# Patient Record
Sex: Female | Born: 1950
Health system: Southern US, Community
[De-identification: ages and names within clinical notes are randomized; demographics above are authoritative.]

## PROBLEM LIST (undated history)

## (undated) DIAGNOSIS — Z87828 Personal history of other (healed) physical injury and trauma: Secondary | ICD-10-CM

## (undated) DIAGNOSIS — Z8719 Personal history of other diseases of the digestive system: Secondary | ICD-10-CM

## (undated) DIAGNOSIS — A630 Anogenital (venereal) warts: Secondary | ICD-10-CM

## (undated) DIAGNOSIS — B1910 Unspecified viral hepatitis B without hepatic coma: Secondary | ICD-10-CM

## (undated) DIAGNOSIS — E876 Hypokalemia: Secondary | ICD-10-CM

## (undated) DIAGNOSIS — I10 Essential (primary) hypertension: Secondary | ICD-10-CM

## (undated) DIAGNOSIS — Z8619 Personal history of other infectious and parasitic diseases: Secondary | ICD-10-CM

## (undated) DIAGNOSIS — M1990 Unspecified osteoarthritis, unspecified site: Secondary | ICD-10-CM

## (undated) DIAGNOSIS — Z9289 Personal history of other medical treatment: Secondary | ICD-10-CM

## (undated) DIAGNOSIS — K635 Polyp of colon: Secondary | ICD-10-CM

## (undated) DIAGNOSIS — R7303 Prediabetes: Secondary | ICD-10-CM

## (undated) DIAGNOSIS — K219 Gastro-esophageal reflux disease without esophagitis: Secondary | ICD-10-CM

## (undated) DIAGNOSIS — K5792 Diverticulitis of intestine, part unspecified, without perforation or abscess without bleeding: Secondary | ICD-10-CM

## (undated) DIAGNOSIS — R011 Cardiac murmur, unspecified: Secondary | ICD-10-CM

## (undated) DIAGNOSIS — Z8669 Personal history of other diseases of the nervous system and sense organs: Secondary | ICD-10-CM

## (undated) HISTORY — DX: Unspecified osteoarthritis, unspecified site: M19.90

## (undated) HISTORY — DX: Personal history of other (healed) physical injury and trauma: Z87.828

## (undated) HISTORY — DX: Personal history of other infectious and parasitic diseases: Z86.19

## (undated) HISTORY — DX: Personal history of other diseases of the nervous system and sense organs: Z86.69

## (undated) HISTORY — DX: Unspecified viral hepatitis B without hepatic coma: B19.10

## (undated) HISTORY — DX: Diverticulitis of intestine, part unspecified, without perforation or abscess without bleeding: K57.92

## (undated) HISTORY — DX: Anogenital (venereal) warts: A63.0

## (undated) HISTORY — PX: VESICOVAGINAL FISTULA CLOSURE W/ TAH: SUR271

## (undated) HISTORY — DX: Personal history of other medical treatment: Z92.89

## (undated) HISTORY — DX: Essential (primary) hypertension: I10

## (undated) HISTORY — DX: Gastro-esophageal reflux disease without esophagitis: K21.9

## (undated) HISTORY — DX: Polyp of colon: K63.5

## (undated) HISTORY — DX: Cardiac murmur, unspecified: R01.1

## (undated) HISTORY — DX: Hypokalemia: E87.6

---
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
## 1978-05-14 HISTORY — PX: APPENDECTOMY: SHX54

## 1978-05-14 HISTORY — PX: ECTOPIC PREGNANCY SURGERY: SHX613

## 2004-05-14 HISTORY — PX: ABDOMINAL HYSTERECTOMY: SHX81

## 2004-05-14 HISTORY — PX: HERNIA REPAIR: SHX51

## 2004-05-14 HISTORY — PX: TOTAL ABDOMINAL HYSTERECTOMY W/ BILATERAL SALPINGOOPHORECTOMY: SHX83

## 2004-05-14 HISTORY — PX: KNEE ARTHROSCOPY: SHX127

## 2007-06-30 ENCOUNTER — Ambulatory Visit: Payer: Self-pay | Admitting: Internal Medicine

## 2008-02-05 ENCOUNTER — Ambulatory Visit: Payer: Self-pay | Admitting: Neurology

## 2008-05-14 DIAGNOSIS — K635 Polyp of colon: Secondary | ICD-10-CM

## 2008-05-14 HISTORY — DX: Polyp of colon: K63.5

## 2008-05-14 LAB — HM PAP SMEAR

## 2008-05-14 LAB — HM COLONOSCOPY

## 2008-05-29 ENCOUNTER — Ambulatory Visit: Payer: Self-pay

## 2009-11-11 ENCOUNTER — Encounter: Payer: Self-pay | Admitting: Cardiovascular Disease

## 2009-11-15 ENCOUNTER — Ambulatory Visit: Payer: Self-pay | Admitting: Internal Medicine

## 2009-11-18 ENCOUNTER — Ambulatory Visit: Payer: Self-pay | Admitting: Cardiovascular Disease

## 2009-11-18 DIAGNOSIS — R079 Chest pain, unspecified: Secondary | ICD-10-CM | POA: Insufficient documentation

## 2009-11-19 DIAGNOSIS — I10 Essential (primary) hypertension: Secondary | ICD-10-CM | POA: Insufficient documentation

## 2009-11-21 ENCOUNTER — Encounter: Payer: Self-pay | Admitting: Cardiovascular Disease

## 2010-02-11 LAB — HM MAMMOGRAPHY

## 2010-02-24 ENCOUNTER — Ambulatory Visit: Payer: Self-pay | Admitting: Unknown Physician Specialty

## 2010-02-27 LAB — PATHOLOGY REPORT

## 2010-06-13 NOTE — Assessment & Plan Note (Signed)
Summary: NEW PT   Visit Type:  Initial Consult Primary Provider:  Carmine Savoy.  CC:  c/o chest pain with shortness of breath and occas. left arm pain.Terri Werner  History of Present Illness: 60 year old woman with a no known cardiac history with obesity,chest pain, chronic knee and back pain, who is enrolled in a hypertension study at [facility] who has been under a tremendous family stressors presents for evaluation of chest pain.  She reports having chest discomfort over the past several years. It happens in the evenings though some nausea in the daytime, coming on with sensations of discomfort between her breasts. It does not last very long, does not seem to be associated with exertion. She does not get to exercise very much as she is very busy. 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 Her mom has lived with her since 2007, and has caused disruptive sleep as she has sleepwalked frequently in the evenings.  She reports that her blood pressure has been elevated recently though she is due to followup at [facility] as part of their blood pressure study and they typically adjust the medications.  She has had a stress test 2 years ago which was reportedly negative. She also had an echocardiogram at that time. Her symptoms are not typically worse since then, seem to come and go. She wonders if it may be due to stress.  EKG shows normal sinus rhythm with rate 61 beats per minute, poor R-wave progression through the precordial leads, left axis deviation, and no other significant ST or T wave changes  Preventive Screening-Counseling & Management  Alcohol-Tobacco     Smoking Status: never  Caffeine-Diet-Exercise     Does Patient Exercise: no      Drug Use:  no.    Current Medications (verified): 1)  Vitamin D3 2000 Unit Caps (Cholecalciferol) .Terri Werner.. 1 Once  Daily 2)  Amlodipine Besylate 2.5 Mg Tabs (Amlodipine Besylate) .Terri Werner.. 1 Once Daily 3)  Meloxicam 15 Mg Tabs (Meloxicam) .Terri Werner.. 1 Once Daily 4)  Ultram 50 Mg Tabs (Tramadol Hcl) .Terri Werner.. 1 Two Times A Day As Needed  Allergies (verified): 1)  ! * Latex 2)  ! * Shellfish  Past History:  Family History: Last updated: 11/18/2009 Family History of Cancer: sister breast cancer Family History of Hyperlipidemia:  Family History of Hypertension: mother alzheimers:mother  Social History: Last updated: 11/18/2009 Full Time Widowed  Tobacco Use - No.  Alcohol Use - yes Regular Exercise - no Drug Use - no  Risk Factors: Exercise: no (11/18/2009)  Risk Factors: Smoking Status: never (11/18/2009)  Past Medical History: Heart murmur Hypertension  Past Surgical History: tonsillectomy hysterectomy C-section laproscopic left knee repair  Family History: Family History of Cancer: sister breast cancer Family History of Hyperlipidemia:  Family History of Hypertension: mother alzheimers:mother  Social History: Full Time Widowed  Tobacco Use - No.  Alcohol Use - yes Regular Exercise - no Drug Use - no Smoking Status:  never Does Patient Exercise:  no Drug Use:  no  Review of Systems       The patient complains of chest pain.  The patient denies fever, weight loss, weight gain, vision loss, decreased hearing, hoarseness, syncope, dyspnea on exertion, peripheral edema, prolonged cough, abdominal pain, incontinence, muscle weakness, depression, and enlarged lymph nodes.    Vital Signs:  Patient profile:  60 year old female Height:      63 inches Weight:      240 pounds BMI:     42.67 Pulse rate:   61 / minute BP sitting:   158 / 88  (left arm)  Vitals Entered By: Bishop Dublin, CMA (November 18, 2009 4:17 PM)  Physical Exam  General:  Well developed, well nourished, in no acute distress.Obese Head:  normocephalic and atraumatic Neck:  Neck supple, no JVD. No masses,  thyromegaly or abnormal cervical nodes. Lungs:  Clear bilaterally to auscultation and percussion. Heart:  Non-displaced PMI, chest non-tender; regular rate and rhythm, S1, S2 without murmurs, rubs or gallops. Carotid upstroke normal, no bruit.  Pedals normal pulses. Trace LE  edema, no varicosities. Abdomen:  Bowel sounds positive; abdomen soft and non-tender without masses, obese Msk:  Back normal, normal gait. Muscle strength and tone normal. Pulses:  pulses normal in all 4 extremities Extremities:  No clubbing or cyanosis. Neurologic:  Alert and oriented x 3. Skin:  Intact without lesions or rashes. Psych:  Normal affect.   Impression & Recommendations:  Problem # 1:  CHEST PAIN UNSPECIFIED (ICD-786.50) etiology of her chest pain is likely noncardiac. She does not have a significant family history of premature coronary artery disease. Her symptoms are somewhat atypical as they come on at rest. It has been going on for several years with negative stress test 2 years ago. We'll try to obtain the stress test for our records.  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 If she has symptoms of worsening shortness of breath or chest discomfort while exerting herself, have asked her to contact me. We would schedule a stress test. If she feels well with exertion, I feel that we could continue to monitor her. I've asked her to watch her diet, increase her exercise in an effort to lose some weight.  Her updated medication list for this problem includes:    Amlodipine Besylate 2.5 Mg Tabs (Amlodipine besylate) .Terri Werner... 1 once daily  Orders: EKG w/ Interpretation (93000)  Problem # 2:  HYPERTENSION, BENIGN (ICD-401.1) Her blood pressure is borderline elevated today and I have suggested she contact the group at [facility] who have her enrolled in the hypertension study to discuss whether they may need to increase her amlodipine dose.  Her updated medication list for  this problem includes:    Amlodipine Besylate 2.5 Mg Tabs (Amlodipine besylate) .Terri Werner... 1 once daily  Patient Instructions: 1)  Your physician recommends that you schedule a follow-up appointment in: as needed  2)  Your physician recommends that you continue on your current medications as directed. Please refer to the Current Medication list given to you today.

## 2010-06-13 NOTE — Letter (Signed)
[facility]   Imported By: Park Breed 11/21/2009 10:32:47  _____________________________________________________________________  External Attachment:    Type:   Image     Comment:   External Document

## 2010-11-28 ENCOUNTER — Encounter: Payer: Self-pay | Admitting: Cardiovascular Disease

## 2011-05-30 ENCOUNTER — Other Ambulatory Visit: Payer: Self-pay | Admitting: Internal Medicine

## 2011-06-22 ENCOUNTER — Encounter: Payer: Self-pay | Admitting: Internal Medicine

## 2011-06-22 ENCOUNTER — Other Ambulatory Visit (HOSPITAL_COMMUNITY)
Admission: RE | Admit: 2011-06-22 | Discharge: 2011-06-22 | Disposition: A | Discharge status: Home / Self Care | Payer: PRIVATE HEALTH INSURANCE | Source: Ambulatory Visit | Attending: Internal Medicine | Admitting: Internal Medicine

## 2011-06-22 ENCOUNTER — Ambulatory Visit (INDEPENDENT_AMBULATORY_CARE_PROVIDER_SITE_OTHER): Payer: PRIVATE HEALTH INSURANCE | Admitting: Internal Medicine

## 2011-06-22 DIAGNOSIS — Z01419 Encounter for gynecological examination (general) (routine) without abnormal findings: Secondary | ICD-10-CM | POA: Insufficient documentation

## 2011-06-22 DIAGNOSIS — Z1159 Encounter for screening for other viral diseases: Secondary | ICD-10-CM | POA: Insufficient documentation

## 2011-06-22 DIAGNOSIS — Z1239 Encounter for other screening for malignant neoplasm of breast: Secondary | ICD-10-CM

## 2011-06-22 DIAGNOSIS — Z0000 Encounter for general adult medical examination without abnormal findings: Secondary | ICD-10-CM | POA: Insufficient documentation

## 2011-06-22 DIAGNOSIS — I10 Essential (primary) hypertension: Secondary | ICD-10-CM

## 2011-06-22 DIAGNOSIS — M2550 Pain in unspecified joint: Secondary | ICD-10-CM

## 2011-06-22 LAB — LIPID PANEL
Cholesterol: 200 mg/dL (ref 0–200)
HDL: 50.4 mg/dL (ref >39.00)
LDL Cholesterol: 135 mg/dL — ABNORMAL HIGH (ref 0–99)
Total CHOL/HDL Ratio: 4
Triglycerides: 72 mg/dL (ref 0.0–149.0)
VLDL: 14.4 mg/dL (ref 0.0–40.0)

## 2011-06-22 LAB — COMPREHENSIVE METABOLIC PANEL
ALT: 15 U/L (ref 0–35)
AST: 18 U/L (ref 0–37)
Albumin: 4.2 g/dL (ref 3.5–5.2)
Alkaline Phosphatase: 56 U/L (ref 39–117)
BUN: 14 mg/dL (ref 6–23)
CO2: 29 mEq/L (ref 19–32)
Calcium: 9.8 mg/dL (ref 8.4–10.5)
Chloride: 103 mEq/L (ref 96–112)
Creatinine, Ser: 0.9 mg/dL (ref 0.4–1.2)
GFR: 78.28 mL/min (ref >60.00)
Glucose, Bld: 94 mg/dL (ref 70–99)
Potassium: 3.8 mEq/L (ref 3.5–5.1)
Sodium: 139 mEq/L (ref 135–145)
Total Bilirubin: 0.4 mg/dL (ref 0.3–1.2)
Total Protein: 8 g/dL (ref 6.0–8.3)

## 2011-06-22 LAB — MICROALBUMIN / CREATININE URINE RATIO
Creatinine,U: 66.9 mg/dL
Microalb Creat Ratio: 1.3 mg/g (ref 0.0–30.0)
Microalb, Ur: 0.9 mg/dL (ref 0.0–1.9)

## 2011-06-22 MED ORDER — TRAMADOL HCL 50 MG PO TABS: 50.0000 mg | ORAL_TABLET | Freq: Two times a day (BID) | ORAL | Status: DC | PRN

## 2011-06-22 NOTE — Assessment & Plan Note (Signed)
Exam normal today. Pap is pending. Mammogram was ordered. Patient is up-to-date on vaccinations. Will obtain records from her previous office. Followup in 6 months.

## 2011-06-22 NOTE — Assessment & Plan Note (Signed)
Breast exam normal today. Mammogram ordered. 

## 2011-06-22 NOTE — Assessment & Plan Note (Signed)
Blood pressure slightly elevated today. However, patient reports good control at home. We'll continue amlodipine. We'll check renal function with labs today. Followup in 6 months.

## 2011-06-22 NOTE — Assessment & Plan Note (Signed)
Chronic secondary to osteoarthritis. Symptoms improved with tramadol. We'll plan to continue. Followup in 6 months.

## 2011-06-22 NOTE — Progress Notes (Signed)
Subjective:    Patient ID: Terri Werner, female    DOB: 06/06/1950, 61 y.o.   MRN: [id number]  HPI 61 year old female with history of hypertension and arthralgias presents for her general medical exam. She reports that she is generally doing well. 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 She is also caring for her elderly mother. She reports some fatigue secondary to this. Overall, however, she is doing well. She reports that her arthralgia is well controlled with use of tramadol. She also reports that her blood pressures well-controlled using amlodipine.  A couple of weeks ago she had an episode in which he had a skin lesion over her right breast which she describes as red and painful and ultimately draining purulent fluid. This has subsequently resolved without any intervention. She also notes that last month, she was seen at urgent care for upper respiratory infection and treated for Augmentin. She reports that her symptoms of cough and congestion have completely resolved.  Outpatient Encounter Prescriptions as of 06/22/2011  Medication Sig Dispense Refill  . amLODipine (NORVASC) 2.5 MG tablet Take 2.5 mg by mouth daily.        . Cholecalciferol (VITAMIN D3) 2000 UNITS capsule Take 3,000 Units by mouth daily.       . meloxicam (MOBIC) 15 MG tablet take 1 tablet by mouth once daily  90 tablet  4  . naproxen sodium (ANAPROX) 220 MG tablet Take 220 mg by mouth 2 (two) times daily as needed.      . traMADol (ULTRAM) 50 MG tablet Take 1 tablet (50 mg total) by mouth 2 (two) times daily as needed.  30 tablet  2  . DISCONTD: traMADol (ULTRAM) 50 MG tablet Take 50 mg by mouth 2 (two) times daily as needed.          Review of Systems  Constitutional: Negative for fever, chills, appetite change, fatigue and unexpected weight change.  HENT: Negative for ear pain, congestion, sore throat, trouble swallowing, neck pain, voice change and sinus  pressure.   Eyes: Negative for visual disturbance.  Respiratory: Negative for cough, shortness of breath, wheezing and stridor.   Cardiovascular: Negative for chest pain, palpitations and leg swelling.  Gastrointestinal: Negative for nausea, vomiting, abdominal pain, diarrhea, constipation, blood in stool, abdominal distention and anal bleeding.  Genitourinary: Negative for dysuria and flank pain.  Musculoskeletal: Positive for arthralgias. Negative for myalgias and gait problem.  Skin: Negative for color change and rash.  Neurological: Negative for dizziness and headaches.  Hematological: Negative for adenopathy. Does not bruise/bleed easily.  Psychiatric/Behavioral: Negative for suicidal ideas, sleep disturbance and dysphoric mood. The patient is not nervous/anxious.    Pulse 81  Temp(Src) 98.5 F (36.9 C) (Oral)  Wt 244 lb (110.678 kg)  SpO2 98%     Objective:   Physical Exam  Constitutional: She is oriented to person, place, and time. She appears well-developed and well-nourished. No distress.  HENT:  Head: Normocephalic and atraumatic.  Right Ear: External ear normal.  Left Ear: External ear normal.  Nose: Nose normal.  Mouth/Throat: Oropharynx is clear and moist. No oropharyngeal exudate.  Eyes: Conjunctivae are normal. Pupils are equal, round, and reactive to light. Right eye exhibits no discharge. Left eye exhibits no discharge. No scleral icterus.  Neck: Normal range of motion. Neck supple. No tracheal deviation present. No thyromegaly present.  Cardiovascular: Normal rate, regular rhythm, normal heart sounds and intact distal pulses.  Exam reveals no  gallop and no friction rub.   No murmur heard. Pulmonary/Chest: Effort normal and breath sounds normal. No respiratory distress. She has no wheezes. She has no rales. She exhibits no tenderness.  Abdominal: Soft. Bowel sounds are normal. She exhibits no distension and no mass. There is no tenderness. There is no rebound and no  guarding.  Genitourinary: No breast swelling, tenderness, discharge or bleeding. Pelvic exam was performed with patient prone. There is no rash, tenderness or lesion on the right labia. There is no rash, tenderness or lesion on the left labia. Cervix exhibits no motion tenderness, no discharge and no friability. Right adnexum displays no mass, no tenderness and no fullness. Left adnexum displays no mass, no tenderness and no fullness. There is tenderness (very tender, attempted exam with both medium and small speculum, but difficult to visualize cervix because of pt discomfort) around the vagina. No erythema around the vagina. No vaginal discharge found.  Musculoskeletal: Normal range of motion. She exhibits no edema and no tenderness.  Lymphadenopathy:    She has no cervical adenopathy.  Neurological: She is alert and oriented to person, place, and time. No cranial nerve deficit. She exhibits normal muscle tone. Coordination normal.  Skin: Skin is warm and dry. No rash noted. She is not diaphoretic. No erythema. No pallor.  Psychiatric: She has a normal mood and affect. Her behavior is normal. Judgment and thought content normal.          Assessment & Plan:

## 2011-07-13 ENCOUNTER — Encounter: Payer: Self-pay | Admitting: Internal Medicine

## 2011-08-02 ENCOUNTER — Encounter: Payer: Self-pay | Admitting: Internal Medicine

## 2011-08-14 ENCOUNTER — Encounter: Payer: Self-pay | Admitting: Internal Medicine

## 2012-06-28 ENCOUNTER — Other Ambulatory Visit: Payer: Self-pay

## 2012-07-02 ENCOUNTER — Telehealth: Payer: Self-pay | Admitting: Internal Medicine

## 2012-07-02 ENCOUNTER — Encounter: Payer: Self-pay | Admitting: Internal Medicine

## 2012-07-02 NOTE — Telephone Encounter (Signed)
Left message for pt to call office

## 2012-07-02 NOTE — Telephone Encounter (Signed)
Left message for pt to call office.  See my chart message below.      Appointment Request From: Terri Werner      With Provider: Shelia Media, MD [-Primary Care Physician-]      Preferred Date Range: From 07/02/2012 To 07/11/2012        Preferred Times: Monday Afternoon, Tuesday Afternoon, Wednesday Afternoon, Thursday Afternoon, Friday Afternoon      Reason: To address the following health maintenance concerns.   Tetanus/Tdap   Zostavax      Comments:   I would also like to have an assessment of neck and chest as I am having pain when coughing, bending and walking up and down steps with some headaches as well. I had flu shot provided at work in the fall 2013.

## 2012-07-03 ENCOUNTER — Emergency Department: Payer: Self-pay | Admitting: Emergency Medicine

## 2012-07-03 ENCOUNTER — Encounter: Payer: Self-pay | Admitting: Adult Health

## 2012-07-03 ENCOUNTER — Ambulatory Visit (INDEPENDENT_AMBULATORY_CARE_PROVIDER_SITE_OTHER): Payer: PRIVATE HEALTH INSURANCE | Admitting: Adult Health

## 2012-07-03 VITALS — BP 176/93 | HR 68 | Temp 98.7°F | Resp 14 | Ht 63.0 in | Wt 249.0 lb

## 2012-07-03 DIAGNOSIS — R0789 Other chest pain: Secondary | ICD-10-CM

## 2012-07-03 DIAGNOSIS — R079 Chest pain, unspecified: Secondary | ICD-10-CM

## 2012-07-03 LAB — COMPREHENSIVE METABOLIC PANEL
Albumin: 3.8 g/dL (ref 3.4–5.0)
Alkaline Phosphatase: 67 U/L (ref 50–136)
Anion Gap: 5 — ABNORMAL LOW (ref 7–16)
BUN: 10 mg/dL (ref 7–18)
Bilirubin,Total: 0.5 mg/dL (ref 0.2–1.0)
Calcium, Total: 9 mg/dL (ref 8.5–10.1)
Chloride: 109 mmol/L — ABNORMAL HIGH (ref 98–107)
Co2: 27 mmol/L (ref 21–32)
Creatinine: 0.81 mg/dL (ref 0.60–1.30)
EGFR (African American): >60
EGFR (Non-African Amer.): >60
Glucose: 77 mg/dL (ref 65–99)
Osmolality: 279 (ref 275–301)
Potassium: 3.7 mmol/L (ref 3.5–5.1)
SGOT(AST): 22 U/L (ref 15–37)
SGPT (ALT): 21 U/L (ref 12–78)
Sodium: 141 mmol/L (ref 136–145)
Total Protein: 8.3 g/dL — ABNORMAL HIGH (ref 6.4–8.2)

## 2012-07-03 LAB — CBC
HCT: 40.6 % (ref 35.0–47.0)
HGB: 13.1 g/dL (ref 12.0–16.0)
MCH: 27.7 pg (ref 26.0–34.0)
MCHC: 32.2 g/dL (ref 32.0–36.0)
MCV: 86 fL (ref 80–100)
Platelet: 206 10*3/uL (ref 150–440)
RBC: 4.72 10*6/uL (ref 3.80–5.20)
RDW: 15.3 % — ABNORMAL HIGH (ref 11.5–14.5)
WBC: 7.8 10*3/uL (ref 3.6–11.0)

## 2012-07-03 LAB — TROPONIN I
Troponin-I: <0.02 ng/mL
Troponin-I: <0.02 ng/mL

## 2012-07-03 NOTE — Assessment & Plan Note (Signed)
Unable to perform an EKG 2/2 computer system being down. Given patients atypical presentation I do not feel comfortable sending her home. Discussed this with patient and I am sending her for further w/u at [facility] emergency dept. I called the ED at [phone] and spoke with the Triage Nurse, Misty Stanley. She will be looking out for her.

## 2012-07-03 NOTE — Progress Notes (Signed)
Subjective:    Patient ID: Terri Werner, female    DOB: [birth year], 62 y.o.   MRN: [id number]  HPI  Patient is a pleasant 62 y/o female who presents to clinic with a 2 day hx of chest pain, neck pain, pain on inspiration.  Patient has been shoveling snow and wondered if she caused some muscle strain. She is short of breath on exertion but she reports that this is not a new finding.   Current Outpatient Prescriptions on File Prior to Visit  Medication Sig Dispense Refill  . amLODipine (NORVASC) 2.5 MG tablet Take 0.25 mg by mouth daily.       . Cholecalciferol (VITAMIN D3) 2000 UNITS capsule Take 3,000 Units by mouth daily.       . meloxicam (MOBIC) 15 MG tablet take 1 tablet by mouth once daily  90 tablet  4  . naproxen sodium (ANAPROX) 220 MG tablet Take 220 mg by mouth 2 (two) times daily as needed.      . traMADol (ULTRAM) 50 MG tablet Take 1 tablet (50 mg total) by mouth 2 (two) times daily as needed.  30 tablet  2   No current facility-administered medications on file prior to visit.      Review of Systems  Respiratory: Positive for chest tightness and shortness of breath.   Cardiovascular: Positive for chest pain.       Patient reports feeling her heart flutter. This lasts approximately 30 seconds.  Gastrointestinal: Negative for nausea.  Neurological: Positive for light-headedness. Negative for syncope.       No syncopal episodes but feeling like she is going to pass out.    BP 176/93  Pulse 68  Temp(Src) 98.7 F (37.1 C) (Oral)  Resp 14  Ht 5\' 3"  (1.6 m)  Wt 249 lb (112.946 kg)  BMI 44.12 kg/m2  SpO2 100%     Objective:   Physical Exam  Constitutional: She is oriented to person, place, and time.  Patient appears short of breath and increases with activity. She is not her usual self.  Cardiovascular: Normal rate, regular rhythm, normal heart sounds and intact distal pulses.   Pulmonary/Chest: Effort normal and breath sounds normal.  Neurological: She  is alert and oriented to person, place, and time.          Assessment & Plan:

## 2012-07-03 NOTE — Telephone Encounter (Signed)
Appointment 07/03/12 with Terri Werner  Pt aware of appointment

## 2012-07-03 NOTE — Patient Instructions (Signed)
  Please go to the ED at [facility] for w/u of chest pain.

## 2012-10-13 ENCOUNTER — Encounter: Payer: Self-pay | Admitting: Internal Medicine

## 2012-10-14 ENCOUNTER — Telehealth: Payer: Self-pay | Admitting: Internal Medicine

## 2012-10-14 NOTE — Telephone Encounter (Signed)
See below my chart message  Appointment Request From: Maryann Conners With Provider: Wynona Dove, MD [-Primary Care Physician-]  Preferred Date Range: From 10/20/2012 To 11/10/2012  Preferred Times: Wednesday Morning, Friday Morning, Wednesday Afternoon, Friday Afternoon  Reason: To address the following health maintenance concerns. Tetanus/Tdap Zostavax Comments:

## 2012-10-16 ENCOUNTER — Encounter: Payer: Self-pay | Admitting: *Deleted

## 2012-10-16 NOTE — Telephone Encounter (Signed)
Sent patient a Mychart message.

## 2012-10-24 ENCOUNTER — Telehealth: Payer: Self-pay | Admitting: Internal Medicine

## 2012-10-24 NOTE — Telephone Encounter (Signed)
Returning your call. °

## 2012-10-24 NOTE — Telephone Encounter (Signed)
Left message stating labs were normal and letter had been mailed to home address on file.

## 2013-02-11 ENCOUNTER — Encounter: Payer: Self-pay | Admitting: Internal Medicine

## 2013-02-16 ENCOUNTER — Other Ambulatory Visit: Payer: Self-pay | Admitting: Internal Medicine

## 2013-02-16 NOTE — Telephone Encounter (Signed)
Eprescribed.

## 2013-03-19 ENCOUNTER — Other Ambulatory Visit: Payer: Self-pay

## 2013-05-28 ENCOUNTER — Telehealth: Payer: Self-pay | Admitting: Internal Medicine

## 2013-05-28 NOTE — Telephone Encounter (Signed)
Patient Information:  Caller Name: Terri Werner  Phone: [phone]  Patient: Terri Werner  Gender: Female  DOB: 20-May-1950  Age: 63 Years  PCP: Ronette Deter (Adults only)  Office Follow Up:  Does the office need to follow up with this patient?: No  Instructions For The Office: N/A   Symptoms  Reason For Call & Symptoms: Pt states she has Flu-like symptoms,  cough, congestion, runny noses with discharge clear to yellow.  Reviewed Health History In EMR: Yes  Reviewed Medications In EMR: Yes  Reviewed Allergies In EMR: Yes  Reviewed Surgeries / Procedures: Yes  Date of Onset of Symptoms: 05/11/2013  Guideline(s) Used:  Influenza - Seasonal  Disposition Per Guideline:   Go to ED Now  Reason For Disposition Reached:   Chest pain (EXCEPTION: Mild central chest pain, present only when coughing)  Advice Given:  Call Back If:  You become short of breath or worse.  Patient Will Follow Care Advice:  YES

## 2013-05-28 NOTE — Telephone Encounter (Signed)
FYI to Dr. Walker 

## 2013-06-02 ENCOUNTER — Other Ambulatory Visit: Payer: Self-pay | Admitting: Internal Medicine

## 2013-07-15 ENCOUNTER — Telehealth: Payer: Self-pay | Admitting: Emergency Medicine

## 2013-07-15 NOTE — Telephone Encounter (Signed)
We can schedule her for a 28min physical.

## 2013-07-15 NOTE — Telephone Encounter (Signed)
Pt calling wanting to schedule a CPE, she states she is your patient but you have only seen her once in clinic and that was 06/22/2011. What is your guidelines for a patient that has not been seen in clinic in two year by you. She has however seen Raquel for a acute visit last February. Please advise.

## 2013-07-20 NOTE — Telephone Encounter (Signed)
lvm for pt to call our office in reference to apt scheduled for her

## 2013-07-21 NOTE — Telephone Encounter (Signed)
Pt left vm.  Asking for call on her cell [phone].  Asks for you to please not call her home phone number.

## 2013-08-31 LAB — HM PAP SMEAR: HM Pap smear: NORMAL

## 2013-09-02 ENCOUNTER — Encounter: Payer: Self-pay | Admitting: Podiatry

## 2013-09-02 ENCOUNTER — Ambulatory Visit (INDEPENDENT_AMBULATORY_CARE_PROVIDER_SITE_OTHER): Payer: No Typology Code available for payment source

## 2013-09-02 ENCOUNTER — Ambulatory Visit (INDEPENDENT_AMBULATORY_CARE_PROVIDER_SITE_OTHER): Payer: No Typology Code available for payment source | Admitting: Podiatry

## 2013-09-02 VITALS — Resp 16 | Ht 64.0 in | Wt 243.0 lb

## 2013-09-02 DIAGNOSIS — M79609 Pain in unspecified limb: Secondary | ICD-10-CM

## 2013-09-02 DIAGNOSIS — M79673 Pain in unspecified foot: Secondary | ICD-10-CM

## 2013-09-02 DIAGNOSIS — M722 Plantar fascial fibromatosis: Secondary | ICD-10-CM

## 2013-09-02 DIAGNOSIS — M76829 Posterior tibial tendinitis, unspecified leg: Secondary | ICD-10-CM

## 2013-09-02 DIAGNOSIS — M76821 Posterior tibial tendinitis, right leg: Secondary | ICD-10-CM

## 2013-09-02 DIAGNOSIS — Q6650 Congenital pes planus, unspecified foot: Secondary | ICD-10-CM

## 2013-09-02 MED ORDER — MELOXICAM 15 MG PO TABS: 15.0000 mg | ORAL_TABLET | Freq: Every day | ORAL | Status: DC

## 2013-09-02 MED ORDER — METHYLPREDNISOLONE (PAK) 4 MG PO TABS: ORAL_TABLET | ORAL | Status: DC

## 2013-09-02 NOTE — Progress Notes (Signed)
Subjective:    Patient ID: Terri Werner, female    DOB: [birth year], 63 y.o.   MRN: [id number]  HPI Comments: i stepped down wrong from a step and hurt my foot, knee and back.i did not fall. My right ankle and heel hurts. The pain has gotten worse. i went to see a chiropractor and it has helped. i have been putting rubs on it, ice, and exercises.  Foot Pain Associated symptoms include fatigue and numbness.      Review of Systems  Constitutional: Positive for activity change and fatigue.  Cardiovascular:       Calf pain with walking  Musculoskeletal:       Joint pain Back pain Difficulty walking  Allergic/Immunologic: Positive for food allergies.  Neurological: Positive for numbness.  All other systems reviewed and are negative.      Objective:   Physical Exam: I have reviewed her past medical history medications allergies surgeries social history of systems. Pulses are strongly palpable bilateral. Neurologic sensorium is intact per since once the monofilament. Deep tendon reflexes are brisk and equal bilateral. Muscle strength is 5 over 5 dorsiflexors plantar flexors inverters everters all intrinsic musculature is intact. Orthopedic evaluation demonstrates pain on palpation of the plantar medial calcaneal tubercle at the plantar fascial calcaneal insertion site. She also has pain on palpation of the posterior tibial tendon of the right foot. There does appear to be some fluctuance within the tendon sheath as it courses beneath the medial malleolus. All pes planus is also noted.        Assessment & Plan:  Assessment: Plantar fasciitis and capsulitis with posterior tibial tendinitis right foot and ankle. We cannot rule out a tear at this point.  Plan: Injected the right heel today put her in a Cam Walker wrote her prescription for [facility] to be followed after a Medrol Dosepak. She will continue to elevate this as much as possible and utilize ice therapy. I will followup  with her in one month

## 2013-09-03 ENCOUNTER — Other Ambulatory Visit: Payer: Self-pay

## 2013-09-03 DIAGNOSIS — Z1231 Encounter for screening mammogram for malignant neoplasm of breast: Secondary | ICD-10-CM

## 2013-09-07 ENCOUNTER — Encounter: Payer: Self-pay | Admitting: Internal Medicine

## 2013-09-07 ENCOUNTER — Ambulatory Visit (INDEPENDENT_AMBULATORY_CARE_PROVIDER_SITE_OTHER): Payer: PRIVATE HEALTH INSURANCE | Admitting: Internal Medicine

## 2013-09-07 VITALS — BP 154/92 | HR 62 | Temp 98.2°F | Resp 12 | Ht 62.0 in | Wt 244.5 lb

## 2013-09-07 DIAGNOSIS — R0609 Other forms of dyspnea: Secondary | ICD-10-CM

## 2013-09-07 DIAGNOSIS — R0600 Dyspnea, unspecified: Secondary | ICD-10-CM

## 2013-09-07 DIAGNOSIS — Z0000 Encounter for general adult medical examination without abnormal findings: Secondary | ICD-10-CM | POA: Insufficient documentation

## 2013-09-07 DIAGNOSIS — R5381 Other malaise: Secondary | ICD-10-CM

## 2013-09-07 DIAGNOSIS — R0989 Other specified symptoms and signs involving the circulatory and respiratory systems: Secondary | ICD-10-CM

## 2013-09-07 DIAGNOSIS — R5383 Other fatigue: Secondary | ICD-10-CM

## 2013-09-07 DIAGNOSIS — R079 Chest pain, unspecified: Secondary | ICD-10-CM

## 2013-09-07 LAB — COMPREHENSIVE METABOLIC PANEL
ALT: 17 U/L (ref 0–35)
AST: 17 U/L (ref 0–37)
Albumin: 4.2 g/dL (ref 3.5–5.2)
Alkaline Phosphatase: 52 U/L (ref 39–117)
BUN: 17 mg/dL (ref 6–23)
CO2: 27 mEq/L (ref 19–32)
Calcium: 9.5 mg/dL (ref 8.4–10.5)
Chloride: 103 mEq/L (ref 96–112)
Creatinine, Ser: 0.7 mg/dL (ref 0.4–1.2)
GFR: 107.41 mL/min (ref >60.00)
Glucose, Bld: 94 mg/dL (ref 70–99)
Potassium: 4.1 mEq/L (ref 3.5–5.1)
Sodium: 140 mEq/L (ref 135–145)
Total Bilirubin: 0.4 mg/dL (ref 0.3–1.2)
Total Protein: 8.2 g/dL (ref 6.0–8.3)

## 2013-09-07 LAB — LIPID PANEL
Cholesterol: 221 mg/dL — ABNORMAL HIGH (ref 0–200)
HDL: 59.2 mg/dL (ref >39.00)
LDL Cholesterol: 150 mg/dL — ABNORMAL HIGH (ref 0–99)
Total CHOL/HDL Ratio: 4
Triglycerides: 60 mg/dL (ref 0.0–149.0)
VLDL: 12 mg/dL (ref 0.0–40.0)

## 2013-09-07 LAB — CBC WITH DIFFERENTIAL/PLATELET
Basophils Absolute: 0 10*3/uL (ref 0.0–0.1)
Basophils Relative: 0.2 % (ref 0.0–3.0)
Eosinophils Absolute: 0 10*3/uL (ref 0.0–0.7)
Eosinophils Relative: 0.2 % (ref 0.0–5.0)
HCT: 40.4 % (ref 36.0–46.0)
Hemoglobin: 13.1 g/dL (ref 12.0–15.0)
Lymphocytes Relative: 17.6 % (ref 12.0–46.0)
Lymphs Abs: 2.1 10*3/uL (ref 0.7–4.0)
MCHC: 32.5 g/dL (ref 30.0–36.0)
MCV: 87.3 fl (ref 78.0–100.0)
Monocytes Absolute: 0.4 10*3/uL (ref 0.1–1.0)
Monocytes Relative: 3.7 % (ref 3.0–12.0)
Neutro Abs: 9.2 10*3/uL — ABNORMAL HIGH (ref 1.4–7.7)
Neutrophils Relative %: 78.3 % — ABNORMAL HIGH (ref 43.0–77.0)
Platelets: 235 10*3/uL (ref 150.0–400.0)
RBC: 4.63 Mil/uL (ref 3.87–5.11)
RDW: 15.7 % — ABNORMAL HIGH (ref 11.5–14.6)
WBC: 11.8 10*3/uL — ABNORMAL HIGH (ref 4.5–10.5)

## 2013-09-07 LAB — MICROALBUMIN / CREATININE URINE RATIO
Creatinine,U: 24.9 mg/dL
Microalb Creat Ratio: 2.8 mg/g (ref 0.0–30.0)
Microalb, Ur: 0.7 mg/dL (ref 0.0–1.9)

## 2013-09-07 LAB — BRAIN NATRIURETIC PEPTIDE: Pro B Natriuretic peptide (BNP): 32 pg/mL (ref 0.0–100.0)

## 2013-09-07 LAB — HEMOGLOBIN A1C: Hgb A1c MFr Bld: 6.2 % (ref 4.6–6.5)

## 2013-09-07 LAB — TSH: TSH: 0.57 u[IU]/mL (ref 0.35–5.50)

## 2013-09-07 LAB — HM MAMMOGRAPHY

## 2013-09-07 NOTE — Assessment & Plan Note (Signed)
Orthopnea noted at night. Will set up cardiology evaluation for possible repeat ECHO.

## 2013-09-07 NOTE — Progress Notes (Signed)
Pre visit review using our clinic review tool, if applicable. No additional management support is needed unless otherwise documented below in the visit note. 

## 2013-09-07 NOTE — Assessment & Plan Note (Signed)
Generalized fatigue. Exam normal.Will check CBC, CMP, TSH with labs. If normal, then consider referral for sleep study.
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
## 2013-09-07 NOTE — Assessment & Plan Note (Signed)
Unclear etiology substernal chest pain. Risk factors for CAD include obesity and HTN. EKG shows possible LA enlargement. Will set up cardiology evaluation. Question if ECHO and treadmill stress test might be helpful for further evaluation.

## 2013-09-08 LAB — VITAMIN D 25 HYDROXY (VIT D DEFICIENCY, FRACTURES): Vit D, 25-Hydroxy: 53 ng/mL (ref 30–89)

## 2013-09-16 ENCOUNTER — Ambulatory Visit
Admission: RE | Admit: 2013-09-16 | Discharge: 2013-09-16 | Disposition: A | Discharge status: Home / Self Care | Payer: No Typology Code available for payment source | Source: Ambulatory Visit

## 2013-09-16 DIAGNOSIS — Z1231 Encounter for screening mammogram for malignant neoplasm of breast: Secondary | ICD-10-CM

## 2013-09-18 ENCOUNTER — Encounter: Payer: Self-pay | Admitting: Cardiovascular Disease

## 2013-09-18 ENCOUNTER — Ambulatory Visit (INDEPENDENT_AMBULATORY_CARE_PROVIDER_SITE_OTHER): Payer: No Typology Code available for payment source | Admitting: Cardiovascular Disease

## 2013-09-18 VITALS — BP 144/96 | HR 60 | Ht 62.5 in | Wt 243.5 lb

## 2013-09-18 DIAGNOSIS — R000 Tachycardia, unspecified: Secondary | ICD-10-CM

## 2013-09-18 DIAGNOSIS — I10 Essential (primary) hypertension: Secondary | ICD-10-CM

## 2013-09-18 DIAGNOSIS — R42 Dizziness and giddiness: Secondary | ICD-10-CM

## 2013-09-18 DIAGNOSIS — R209 Unspecified disturbances of skin sensation: Secondary | ICD-10-CM

## 2013-09-18 DIAGNOSIS — R079 Chest pain, unspecified: Secondary | ICD-10-CM

## 2013-09-18 DIAGNOSIS — R200 Anesthesia of skin: Secondary | ICD-10-CM | POA: Insufficient documentation

## 2013-09-18 MED ORDER — PRAVASTATIN SODIUM 20 MG PO TABS: 20.0000 mg | ORAL_TABLET | Freq: Every evening | ORAL | Status: DC

## 2013-09-18 NOTE — Assessment & Plan Note (Signed)
Blood pressures running high. We have recommended she increase her amlodipine to 5 mg daily

## 2013-09-18 NOTE — Patient Instructions (Signed)
You are doing well.  We will schedule a treadmill stress test on a Wednesday for chest pains  Please start pravastatin one a day for cholesterol   Please call us if you have new issues that need to be addressed before your next appt.

## 2013-09-18 NOTE — Assessment & Plan Note (Addendum)
Long history of atypical chest pain. We had a long discussion about her recent symptoms and possible etiology. Symptoms presenting still at rest, not with exertion such as when she is at the gym. She has very few risk factors, she is a nonsmoker, no diabetes. Long discussion about various options for ischemia workup. We have ordered a routine treadmill study on her. She would like to avoid expensive nuclear stress test.

## 2013-09-18 NOTE — Progress Notes (Signed)
Patient ID: Terri Werner, female    DOB: [birth year], 63 y.o.   MRN: [id number]  HPI Comments: 63 year old woman with a no known cardiac history with obesity,chest pain, chronic knee and back pain, who is enrolled in a hypertension study at [facility], history of chronic chest pain. Seen previously in our clinic and felt to have atypical chest pain. She presents for symptoms of chest pain, arm and leg numbness. Allergy to ACE inh  she was seen in the emergency room February 2014 for chest pain, workup was negative   Chest pain symptoms date back many years. In general she reports having chest pain at home at the end of the day when she sits on the couch and does charts from work. Also sometimes when in bed. She raises her bed up, does not a comfortable and sleeps better in the recliner. Periodically she goes to the gym and reports having no significant chest pain with exercise. Discomfort is in her mediastinal area, presents like a squeezing. Usually resolves without intervention. Typically does not last very long. Possible resolution with change in position.  At home, blood pressure has been running high typically systolics in the 140s, diastolic in the 90s. She is followed by group affiliated with [facility] and was weaned off her amlodipine. Blood pressure was running high. Recently restarted amlodipine 2.5 mg daily.   Mother passed away at the end of 29-Apr-2013. Prior to this, she was taking care of her. Stress test several years ago, unable to obtain these results. Also had prior echocardiogram at that time    EKG shows normal sinus rhythm with rate 60 beats per minute,  left axis deviation, and no other significant ST or T wave changes     Outpatient Encounter Prescriptions as of 09/18/2013  Medication Sig  . amLODipine (NORVASC) 2.5 MG tablet Take 0.25 mg by mouth daily.   Marland Kitchen aspirin 81 MG tablet Take 81 mg by mouth daily.  . meloxicam (MOBIC) 15 MG tablet Take 15 mg by mouth daily as  needed.  . nystatin cream (MYCOSTATIN) Apply 1 application topically daily.   . [DISCONTINUED] meloxicam (MOBIC) 15 MG tablet Take 1 tablet (15 mg total) by mouth daily.  . pravastatin (PRAVACHOL) 20 MG tablet Take 1 tablet (20 mg total) by mouth every evening.  . [DISCONTINUED] methylPREDNIsolone (MEDROL DOSPACK) 4 MG tablet follow package directions     Review of Systems  Constitutional: Negative.   HENT: Negative.   Eyes: Negative.   Respiratory: Positive for chest tightness.   Cardiovascular: Negative.   Gastrointestinal: Negative.   Endocrine: Negative.   Musculoskeletal: Negative.        Arm and leg numbness at times arm  Skin: Negative.   Allergic/Immunologic: Negative.   Neurological: Negative.   Hematological: Negative.   Psychiatric/Behavioral: Negative.   All other systems reviewed and are negative.   BP 144/96  Pulse 60  Ht 5' 2.5" (1.588 m)  Wt 243 lb 8 oz (110.451 kg)  BMI 43.80 kg/m2  Physical Exam  Nursing note and vitals reviewed. Constitutional: She is oriented to person, place, and time. She appears well-developed and well-nourished.  Obese  HENT:  Head: Normocephalic.  Nose: Nose normal.  Mouth/Throat: Oropharynx is clear and moist.  Eyes: Conjunctivae are normal. Pupils are equal, round, and reactive to light.  Neck: Normal range of motion. Neck supple. No JVD present.  Cardiovascular: Normal rate, regular rhythm, S1 normal, S2 normal, normal heart sounds and intact distal pulses.  Exam  reveals no gallop and no friction rub.   No murmur heard. Pulmonary/Chest: Effort normal and breath sounds normal. No respiratory distress. She has no wheezes. She has no rales. She exhibits no tenderness.  Abdominal: Soft. Bowel sounds are normal. She exhibits no distension. There is no tenderness.  Musculoskeletal: Normal range of motion. She exhibits no edema and no tenderness.  Lymphadenopathy:    She has no cervical adenopathy.  Neurological: She is alert and  oriented to person, place, and time. Coordination normal.  Skin: Skin is warm and dry. No rash noted. No erythema.  Psychiatric: She has a normal mood and affect. Her behavior is normal. Judgment and thought content normal.    Assessment and Plan

## 2013-09-18 NOTE — Assessment & Plan Note (Signed)
Arm and leg tingling, numbness likely unrelated to any cardiac issue. Possibly from nerve entrapment, arthritis in her neck.

## 2013-09-21 ENCOUNTER — Other Ambulatory Visit (INDEPENDENT_AMBULATORY_CARE_PROVIDER_SITE_OTHER): Payer: No Typology Code available for payment source

## 2013-09-21 ENCOUNTER — Telehealth: Payer: Self-pay | Admitting: *Deleted

## 2013-09-21 DIAGNOSIS — D72819 Decreased white blood cell count, unspecified: Secondary | ICD-10-CM

## 2013-09-21 DIAGNOSIS — D72829 Elevated white blood cell count, unspecified: Secondary | ICD-10-CM

## 2013-09-21 LAB — CBC WITH DIFFERENTIAL/PLATELET
Basophils Absolute: 0 10*3/uL (ref 0.0–0.1)
Basophils Relative: 0.1 % (ref 0.0–3.0)
Eosinophils Absolute: 0.2 10*3/uL (ref 0.0–0.7)
Eosinophils Relative: 2.7 % (ref 0.0–5.0)
HCT: 37.2 % (ref 36.0–46.0)
Hemoglobin: 12.3 g/dL (ref 12.0–15.0)
Lymphocytes Relative: 34.8 % (ref 12.0–46.0)
Lymphs Abs: 2.4 10*3/uL (ref 0.7–4.0)
MCHC: 33.1 g/dL (ref 30.0–36.0)
MCV: 86.2 fl (ref 78.0–100.0)
Monocytes Absolute: 0.6 10*3/uL (ref 0.1–1.0)
Monocytes Relative: 9.5 % (ref 3.0–12.0)
Neutro Abs: 3.6 10*3/uL (ref 1.4–7.7)
Neutrophils Relative %: 52.9 % (ref 43.0–77.0)
Platelets: 176 10*3/uL (ref 150.0–400.0)
RBC: 4.32 Mil/uL (ref 3.87–5.11)
RDW: 15.5 % (ref 11.5–15.5)
WBC: 6.8 10*3/uL (ref 4.0–10.5)

## 2013-09-21 NOTE — Telephone Encounter (Signed)
CBC for leukocytosis

## 2013-09-21 NOTE — Telephone Encounter (Signed)
What labs and dx?  

## 2013-09-23 ENCOUNTER — Other Ambulatory Visit: Payer: Self-pay | Admitting: Obstetrics and Gynecology

## 2013-09-23 DIAGNOSIS — R928 Other abnormal and inconclusive findings on diagnostic imaging of breast: Secondary | ICD-10-CM

## 2013-09-28 ENCOUNTER — Encounter: Payer: Self-pay | Admitting: Podiatry

## 2013-09-28 ENCOUNTER — Ambulatory Visit (INDEPENDENT_AMBULATORY_CARE_PROVIDER_SITE_OTHER): Payer: No Typology Code available for payment source | Admitting: Podiatry

## 2013-09-28 VITALS — BP 121/75 | HR 77 | Resp 16

## 2013-09-28 DIAGNOSIS — M76821 Posterior tibial tendinitis, right leg: Secondary | ICD-10-CM

## 2013-09-28 DIAGNOSIS — M76829 Posterior tibial tendinitis, unspecified leg: Secondary | ICD-10-CM

## 2013-09-28 DIAGNOSIS — M722 Plantar fascial fibromatosis: Secondary | ICD-10-CM

## 2013-09-28 NOTE — Progress Notes (Signed)
She presents today states that she's approximately 50% improved. She has pain on palpation medial continued tubercle at its much better.  Objective: Pulses are strongly palpable right. She has mild tenderness on palpation medial continued tubercle of the right heel.  Assessment: Plantar fasciitis right.  Plan: Reinjected right heel today. Discussed appropriate shoe g will followup with her one month.ear stretching exercises ice therapy shoe gear modifications. Suggested she continue all conservative therapies

## 2013-09-30 ENCOUNTER — Encounter: Payer: Self-pay | Admitting: Cardiovascular Disease

## 2013-09-30 ENCOUNTER — Ambulatory Visit (INDEPENDENT_AMBULATORY_CARE_PROVIDER_SITE_OTHER): Payer: No Typology Code available for payment source | Admitting: Cardiovascular Disease

## 2013-09-30 VITALS — Ht 62.5 in | Wt 243.0 lb

## 2013-09-30 DIAGNOSIS — R079 Chest pain, unspecified: Secondary | ICD-10-CM

## 2013-09-30 DIAGNOSIS — R42 Dizziness and giddiness: Secondary | ICD-10-CM

## 2013-09-30 DIAGNOSIS — R000 Tachycardia, unspecified: Secondary | ICD-10-CM

## 2013-09-30 MED ORDER — AMLODIPINE BESYLATE 10 MG PO TABS: 10.0000 mg | ORAL_TABLET | Freq: Every day | ORAL | Status: DC

## 2013-09-30 NOTE — Patient Instructions (Addendum)
Please increase amlodipine to 10mg  daily Start low grade exercise program   Please call us if you have new issues that need to be addressed before your next appt.  Your physician wants you to follow-up in: 6 months.  You will receive a reminder letter in the mail two months in advance. If you don't receive a letter, please call our office to schedule the follow-up appointment.

## 2013-09-30 NOTE — Procedures (Signed)
Exercise Treadmill Test  Treadmill ordered for recent epsiodes of chest pain.  Resting EKG shows NSR with rate of 80 bpm, no significant ST or T wave changes.  Resting blood pressure of 176/94 Stand bruce protocal was used.  Patient exercised for 4 min 28 sec,  Peak heart rate of 139 bpm.  This was 87% of the maximum predicted heart rate. Achieved 6.3  METS No symptoms of chest pain or lightheadedness were reported at peak stress or in recovery.  Peak Blood pressure recorded was 253/77 Heart rate at 3 minutes in recovery was 93 bpm. No ST abnormality concerning for ischemia  FINAL IMPRESSION: No significant EKG changes concerning for ischemia. Adequate exercise tolerance. Severe hypertension with exertion We have recommended she increase her amlodipine up to 10 mg daily

## 2013-10-07 ENCOUNTER — Ambulatory Visit
Admission: RE | Admit: 2013-10-07 | Discharge: 2013-10-07 | Disposition: A | Discharge status: Home / Self Care | Payer: No Typology Code available for payment source | Source: Ambulatory Visit | Attending: Obstetrics and Gynecology | Admitting: Obstetrics and Gynecology

## 2013-10-07 DIAGNOSIS — R928 Other abnormal and inconclusive findings on diagnostic imaging of breast: Secondary | ICD-10-CM

## 2013-10-07 LAB — HM MAMMOGRAPHY: HM Mammogram: NORMAL

## 2013-10-08 ENCOUNTER — Encounter: Payer: Self-pay | Admitting: Internal Medicine

## 2013-10-08 ENCOUNTER — Ambulatory Visit (INDEPENDENT_AMBULATORY_CARE_PROVIDER_SITE_OTHER): Payer: No Typology Code available for payment source | Admitting: Internal Medicine

## 2013-10-08 VITALS — BP 126/80 | HR 78 | Temp 98.6°F | Ht 62.0 in | Wt 240.8 lb

## 2013-10-08 DIAGNOSIS — E785 Hyperlipidemia, unspecified: Secondary | ICD-10-CM

## 2013-10-08 DIAGNOSIS — I10 Essential (primary) hypertension: Secondary | ICD-10-CM

## 2013-10-08 DIAGNOSIS — R079 Chest pain, unspecified: Secondary | ICD-10-CM

## 2013-10-08 NOTE — Progress Notes (Signed)
Subjective:    Patient ID: Terri Werner, female    DOB: 11-Aug-1950, 63 y.o.   MRN: [id number]  HPI 63YO female presents for follow up. Recently seen by Dr. Mariah Milling. Had stress test which was normal. No recent chest pain. Started higher dose of Amlodipine 10mg  daily.  Recently had mammogram and left breast US for asymmetry. [place] was normal.  She was started on Pravastatin by Dr. Mariah Milling. Tolerating well. Has not had repeat lipids. Started medication 5/20.  Also questions why she has been denied for blood donation. Reviewed labs which showed HepB core positive. All other testing was negative.  Review of Systems  Constitutional: Negative for fever, chills, appetite change, fatigue and unexpected weight change.  HENT: Negative for congestion, ear pain, sinus pressure, sore throat, trouble swallowing and voice change.   Eyes: Negative for visual disturbance.  Respiratory: Negative for cough, shortness of breath, wheezing and stridor.   Cardiovascular: Negative for chest pain, palpitations and leg swelling.  Gastrointestinal: Negative for nausea, vomiting, abdominal pain, diarrhea, constipation, blood in stool, abdominal distention and anal bleeding.  Genitourinary: Negative for dysuria and flank pain.  Musculoskeletal: Negative for arthralgias, gait problem, myalgias and neck pain.  Skin: Negative for color change and rash.  Neurological: Negative for dizziness and headaches.  Hematological: Negative for adenopathy. Does not bruise/bleed easily.  Psychiatric/Behavioral: Negative for suicidal ideas, sleep disturbance and dysphoric mood. The patient is not nervous/anxious.        Objective:    BP 126/80  Pulse 78  Temp(Src) 98.6 F (37 C) (Oral)  Ht 5\' 2"  (1.575 m)  Wt 240 lb 12 oz (109.203 kg)  BMI 44.02 kg/m2  SpO2 96% Physical Exam  Constitutional: She is oriented to person, place, and time. She appears well-developed and well-nourished. No distress.  HENT:  Head:  Normocephalic and atraumatic.  Right Ear: External ear normal.  Left Ear: External ear normal.  Nose: Nose normal.  Mouth/Throat: Oropharynx is clear and moist. No oropharyngeal exudate.  Eyes: Conjunctivae are normal. Pupils are equal, round, and reactive to light. Right eye exhibits no discharge. Left eye exhibits no discharge. No scleral icterus.  Neck: Normal range of motion. Neck supple. No tracheal deviation present. No thyromegaly present.  Cardiovascular: Normal rate, regular rhythm, normal heart sounds and intact distal pulses.  Exam reveals no gallop and no friction rub.   No murmur heard. Pulmonary/Chest: Effort normal and breath sounds normal. No accessory muscle usage. Not tachypneic. No respiratory distress. She has no decreased breath sounds. She has no wheezes. She has no rhonchi. She has no rales. She exhibits no tenderness.  Musculoskeletal: Normal range of motion. She exhibits no edema and no tenderness.  Lymphadenopathy:    She has no cervical adenopathy.  Neurological: She is alert and oriented to person, place, and time. No cranial nerve deficit. She exhibits normal muscle tone. Coordination normal.  Skin: Skin is warm and dry. No rash noted. She is not diaphoretic. No erythema. No pallor.  Psychiatric: She has a normal mood and affect. Her behavior is normal. Judgment and thought content normal.          Assessment & Plan:   Problem List Items Addressed This Visit     Unprioritized   Chest pain     Recent stress test was normal per pt report. No recurrent chest pain. Pain most likely was musculoskeletal. Will continue to monitor.    Hyperlipidemia     Lipids elevated on recent labs, started on Pravastatin  by cardiology. Will recheck lipids and lfts in 1 month.    Relevant Orders      Lipid panel      Comprehensive metabolic panel   HYPERTENSION, BENIGN - Primary      BP Readings from Last 3 Encounters:  10/08/13 126/80  09/28/13 121/75  09/18/13 144/96     BP better controlled on Amlodipine 10mg  daily. Will continue.        Return in about 6 months (around 04/10/2014) for Physical.

## 2013-10-08 NOTE — Assessment & Plan Note (Signed)
Lipids elevated on recent labs, started on Pravastatin by cardiology. Will recheck lipids and lfts in 1 month.

## 2013-10-08 NOTE — Progress Notes (Signed)
Pre visit review using our clinic review tool, if applicable. No additional management support is needed unless otherwise documented below in the visit note. 

## 2013-10-08 NOTE — Assessment & Plan Note (Signed)
BP Readings from Last 3 Encounters:  10/08/13 126/80  09/28/13 121/75  09/18/13 144/96   BP better controlled on Amlodipine 10mg  daily. Will continue.

## 2013-10-08 NOTE — Assessment & Plan Note (Signed)
Recent stress test was normal per pt report. No recurrent chest pain. Pain most likely was musculoskeletal. Will continue to monitor.

## 2013-10-26 ENCOUNTER — Ambulatory Visit: Payer: No Typology Code available for payment source | Admitting: Podiatry

## 2013-11-09 ENCOUNTER — Ambulatory Visit (INDEPENDENT_AMBULATORY_CARE_PROVIDER_SITE_OTHER): Payer: No Typology Code available for payment source | Admitting: Podiatry

## 2013-11-09 ENCOUNTER — Encounter: Payer: Self-pay | Admitting: Podiatry

## 2013-11-09 DIAGNOSIS — M722 Plantar fascial fibromatosis: Secondary | ICD-10-CM

## 2013-11-09 NOTE — Progress Notes (Signed)
She presents today stating that her plantar fasciitis is approximately 90% resolved to her left foot.  Objective: No pain on palpation medial continued tubercle left heel.  Assessment: Well-healing plantar fasciitis left.  Plan: Continue all conservative therapies until she is 1 month out.

## 2013-11-16 ENCOUNTER — Telehealth: Payer: Self-pay | Admitting: *Deleted

## 2013-11-16 NOTE — Telephone Encounter (Signed)
Spoke w/ pt.  She reports that since starting pravastatin, she has tinnitus to the point that her hearing is affected, sounds like she is "in a can". C/o dizziness, no energy, HA, BP 120-130/70-80. Advised pt to hold pravastatin x 1 week and call me on Friday to let me know if sx have resolved.

## 2013-11-16 NOTE — Telephone Encounter (Signed)
Patient called and is having problem with Pravastation. She is having headaches, no energy, coughing, dizziness and ringing in ears. Please call patient.

## 2014-04-04 ENCOUNTER — Other Ambulatory Visit: Payer: Self-pay | Admitting: Cardiovascular Disease

## 2014-04-15 ENCOUNTER — Encounter: Payer: Self-pay | Admitting: Internal Medicine

## 2014-04-15 ENCOUNTER — Encounter: Payer: No Typology Code available for payment source | Admitting: Internal Medicine

## 2014-04-15 ENCOUNTER — Telehealth: Payer: Self-pay | Admitting: Internal Medicine

## 2014-04-15 ENCOUNTER — Ambulatory Visit (INDEPENDENT_AMBULATORY_CARE_PROVIDER_SITE_OTHER): Payer: Self-pay | Admitting: Internal Medicine

## 2014-04-15 VITALS — BP 163/88 | HR 69 | Temp 98.3°F | Ht 63.0 in | Wt 246.8 lb

## 2014-04-15 DIAGNOSIS — M542 Cervicalgia: Secondary | ICD-10-CM

## 2014-04-15 DIAGNOSIS — R42 Dizziness and giddiness: Secondary | ICD-10-CM

## 2014-04-15 DIAGNOSIS — Z0000 Encounter for general adult medical examination without abnormal findings: Secondary | ICD-10-CM

## 2014-04-15 LAB — VITAMIN D 25 HYDROXY (VIT D DEFICIENCY, FRACTURES): VITD: 28.98 ng/mL — ABNORMAL LOW (ref 30.00–100.00)

## 2014-04-15 LAB — CBC WITH DIFFERENTIAL/PLATELET
Basophils Absolute: 0 10*3/uL (ref 0.0–0.1)
Basophils Relative: 0.5 % (ref 0.0–3.0)
Eosinophils Absolute: 0.2 10*3/uL (ref 0.0–0.7)
Eosinophils Relative: 3.3 % (ref 0.0–5.0)
HCT: 39.1 % (ref 36.0–46.0)
Hemoglobin: 12.6 g/dL (ref 12.0–15.0)
Lymphocytes Relative: 32.9 % (ref 12.0–46.0)
Lymphs Abs: 1.8 10*3/uL (ref 0.7–4.0)
MCHC: 32.1 g/dL (ref 30.0–36.0)
MCV: 86 fl (ref 78.0–100.0)
Monocytes Absolute: 0.5 10*3/uL (ref 0.1–1.0)
Monocytes Relative: 9.8 % (ref 3.0–12.0)
Neutro Abs: 2.9 10*3/uL (ref 1.4–7.7)
Neutrophils Relative %: 53.5 % (ref 43.0–77.0)
Platelets: 193 10*3/uL (ref 150.0–400.0)
RBC: 4.55 Mil/uL (ref 3.87–5.11)
RDW: 15.9 % — ABNORMAL HIGH (ref 11.5–15.5)
WBC: 5.5 10*3/uL (ref 4.0–10.5)

## 2014-04-15 LAB — HEMOGLOBIN A1C: Hgb A1c MFr Bld: 6.6 % — ABNORMAL HIGH (ref 4.6–6.5)

## 2014-04-15 LAB — TSH: TSH: 1.63 u[IU]/mL (ref 0.35–4.50)

## 2014-04-15 MED ORDER — HYDROCODONE-ACETAMINOPHEN 5-325 MG PO TABS
1.0000 | ORAL_TABLET | Freq: Four times a day (QID) | ORAL | Status: DC | PRN
Start: 2014-04-15 — End: 2014-11-26

## 2014-04-15 NOTE — Telephone Encounter (Signed)
Please advise 

## 2014-04-15 NOTE — Assessment & Plan Note (Signed)
Symptoms began after recent procedure during massage therapy. Will get MRI/MRA neck and head for further evaluation as noted above.

## 2014-04-15 NOTE — Progress Notes (Signed)
Subjective:    Patient ID: Terri Werner, female    DOB: 10/12/1950, 63 y.o.   MRN: [id number]  HPI 63YO female presents for acute visit.  HTN - BP has been near 140s systolic at home. Taking the Amlodipine regularly. BP at [facility] 130s systolic.  Having some problems with intermittent vertigo over last 2-3 months. Has fallen on a couple of occasions.  Symptoms began after a massage on 10/24 had a procedure on her head with rapid movement forward. Since that time, has felt intermittently dizzy and nauseous. Having periodic severe neck pain on the right. Described as aching. No visual changes. Occasional right arm numbness, extending down to her hand. Notes she has been dropping objects from right hand. Taking Aleve for the pain in her neck, but little improvement with this.   Review of Systems  Constitutional: Negative for fever, chills, appetite change, fatigue and unexpected weight change.  Eyes: Negative for visual disturbance.  Respiratory: Negative for shortness of breath.   Cardiovascular: Negative for chest pain and leg swelling.  Gastrointestinal: Negative for abdominal pain.  Musculoskeletal: Positive for myalgias, arthralgias and neck pain. Negative for neck stiffness.  Skin: Negative for color change and rash.  Neurological: Positive for dizziness, weakness, light-headedness, numbness and headaches. Negative for tremors, seizures, syncope and speech difficulty.  Hematological: Negative for adenopathy. Does not bruise/bleed easily.  Psychiatric/Behavioral: Negative for dysphoric mood. The patient is not nervous/anxious.        Objective:    BP 163/88 mmHg  Pulse 69  Temp(Src) 98.3 F (36.8 C) (Oral)  Ht 5\' 3"  (1.6 m)  Wt 246 lb 12 oz (111.925 kg)  BMI 43.72 kg/m2  SpO2 98% Physical Exam  Constitutional: She is oriented to person, place, and time. She appears well-developed and well-nourished. No distress.  HENT:  Head: Normocephalic and atraumatic.  Right  Ear: External ear normal.  Left Ear: External ear normal.  Nose: Nose normal.  Mouth/Throat: Oropharynx is clear and moist. No oropharyngeal exudate.  Eyes: Conjunctivae are normal. Pupils are equal, round, and reactive to light. Right eye exhibits no discharge. Left eye exhibits no discharge. No scleral icterus.  Neck: Normal range of motion. Neck supple. Muscular tenderness (slight lateral neck) present. No spinous process tenderness present. Carotid bruit is not present. No rigidity. No tracheal deviation, no edema, no erythema and normal range of motion present. No thyromegaly present.    Cardiovascular: Normal rate, regular rhythm, normal heart sounds and intact distal pulses.  Exam reveals no gallop and no friction rub.   No murmur heard. Pulmonary/Chest: Effort normal and breath sounds normal. No accessory muscle usage. No tachypnea. No respiratory distress. She has no decreased breath sounds. She has no wheezes. She has no rhonchi. She has no rales. She exhibits no tenderness.  Musculoskeletal: Normal range of motion. She exhibits no edema or tenderness.  Lymphadenopathy:    She has no cervical adenopathy.  Neurological: She is alert and oriented to person, place, and time. She displays no atrophy and no tremor. No cranial nerve deficit or sensory deficit. She exhibits normal muscle tone. Coordination and gait normal.  Skin: Skin is warm and dry. No rash noted. She is not diaphoretic. No erythema. No pallor.  Psychiatric: She has a normal mood and affect. Her behavior is normal. Judgment and thought content normal.          Assessment & Plan:   Problem List Items Addressed This Visit      Unprioritized   Dizziness  Symptoms began after recent procedure during massage therapy. Will get MRI/MRA neck and head for further evaluation as noted above.    Relevant Orders      MR MRA Head/Brain Wo Cm   Neck pain on right side - Primary    Intermittent severe right sided neck pain,  dizziness and right arm numbness and weakness after rapid movement of head forward during recent massage therapy. Given her focal neurologic symptoms of intermittent numbness in her right arm and weakness in her right arm and hand, along with headache and lightheadedness, I am concerned about vertebral artery injury. Will set up MRI/MRA head and neck for further evaluation.     Relevant Medications      HYDROcodone-acetaminophen (NORCO/VICODIN) 5-325 MG per tablet   Other Relevant Orders      MR Angiogram Neck W Contrast   Routine general medical examination at a health care facility   Relevant Orders      TSH      Hemoglobin A1c      CBC with Differential      Comprehensive metabolic panel      Lipid panel      Microalbumin / creatinine urine ratio      Vit D  25 hydroxy (rtn osteoporosis monitoring)       Return in about 1 week (around 04/22/2014) for Recheck.

## 2014-04-15 NOTE — Telephone Encounter (Signed)
3:30pm 12/10

## 2014-04-15 NOTE — Assessment & Plan Note (Signed)
Intermittent severe right sided neck pain, dizziness and right arm numbness and weakness after rapid movement of head forward during recent massage therapy. Given her focal neurologic symptoms of intermittent numbness in her right arm and weakness in her right arm and hand, along with headache and lightheadedness, I am concerned about vertebral artery injury. Will set up MRI/MRA head and neck for further evaluation.

## 2014-04-15 NOTE — Progress Notes (Signed)
Pre visit review using our clinic review tool, if applicable. No additional management support is needed unless otherwise documented below in the visit note. 

## 2014-04-15 NOTE — Patient Instructions (Addendum)
We will set up MRI brain and neck once we have approval from your insurance.  Start Hydrocodone as needed for pain.

## 2014-04-15 NOTE — Telephone Encounter (Signed)
Pt needs one week recheck. Please advise where to add to schedule

## 2014-04-17 LAB — COMPREHENSIVE METABOLIC PANEL
ALT: 12 U/L (ref 0–35)
AST: 17 U/L (ref 0–37)
Albumin: 4.2 g/dL (ref 3.5–5.2)
Alkaline Phosphatase: 56 U/L (ref 39–117)
BUN: 16 mg/dL (ref 6–23)
CO2: 26 mEq/L (ref 19–32)
Calcium: 9.2 mg/dL (ref 8.4–10.5)
Chloride: 109 mEq/L (ref 96–112)
Creatinine, Ser: 0.9 mg/dL (ref 0.4–1.2)
GFR: 83.68 mL/min (ref >60.00)
Glucose, Bld: 97 mg/dL (ref 70–99)
Potassium: 4.4 mEq/L (ref 3.5–5.1)
Sodium: 144 mEq/L (ref 135–145)
Total Bilirubin: 0.6 mg/dL (ref 0.2–1.2)
Total Protein: 7.4 g/dL (ref 6.0–8.3)

## 2014-04-17 LAB — LIPID PANEL
Cholesterol: 187 mg/dL (ref 0–200)
HDL: 48 mg/dL (ref >39.00)
LDL Cholesterol: 128 mg/dL — ABNORMAL HIGH (ref 0–99)
NonHDL: 139
Total CHOL/HDL Ratio: 4
Triglycerides: 57 mg/dL (ref 0.0–149.0)
VLDL: 11.4 mg/dL (ref 0.0–40.0)

## 2014-04-17 LAB — MICROALBUMIN / CREATININE URINE RATIO
Creatinine,U: 88.2 mg/dL
Microalb Creat Ratio: 0.6 mg/g (ref 0.0–30.0)
Microalb, Ur: 0.5 mg/dL (ref 0.0–1.9)

## 2014-04-22 ENCOUNTER — Ambulatory Visit: Payer: Self-pay | Admitting: Internal Medicine

## 2014-04-22 ENCOUNTER — Telehealth: Payer: Self-pay | Admitting: *Deleted

## 2014-04-22 NOTE — Telephone Encounter (Signed)
Yes, can we schedule her for 4pm on 12/15. 37min

## 2014-04-22 NOTE — Telephone Encounter (Signed)
Pt was unaware of todays appt.  Pt is scheduled for MRI on Monday 04/26/14, please advise when to schedule her a follow up appt.  Cancelling appt for today

## 2014-04-22 NOTE — Telephone Encounter (Signed)
Please notify pt of appt and add her to the schedule, thank you

## 2014-04-26 ENCOUNTER — Ambulatory Visit: Payer: Self-pay | Admitting: Internal Medicine

## 2014-04-26 NOTE — Telephone Encounter (Signed)
The patient has been scheduled and she is aware of this appointment.

## 2014-04-27 ENCOUNTER — Encounter: Payer: Self-pay | Admitting: Internal Medicine

## 2014-04-27 ENCOUNTER — Ambulatory Visit (INDEPENDENT_AMBULATORY_CARE_PROVIDER_SITE_OTHER): Payer: No Typology Code available for payment source | Admitting: Internal Medicine

## 2014-04-27 VITALS — BP 128/64 | HR 88 | Temp 98.4°F | Resp 16 | Ht 63.0 in | Wt 248.2 lb

## 2014-04-27 DIAGNOSIS — I779 Disorder of arteries and arterioles, unspecified: Secondary | ICD-10-CM

## 2014-04-27 DIAGNOSIS — I739 Peripheral vascular disease, unspecified: Secondary | ICD-10-CM

## 2014-04-27 DIAGNOSIS — M542 Cervicalgia: Secondary | ICD-10-CM

## 2014-04-27 MED ORDER — CYCLOBENZAPRINE HCL 5 MG PO TABS: 5.0000 mg | ORAL_TABLET | Freq: Three times a day (TID) | ORAL | Status: DC | PRN

## 2014-04-27 NOTE — Assessment & Plan Note (Signed)
Recent injury to right neck. MRI/MRA showed tortuous right ICA. Discussed with pt that this finding likely not related to recent injury. Will however set up evaluation with vascular surgery to see if any additional follow up might be warranted.

## 2014-04-27 NOTE — Patient Instructions (Signed)
Start Flexeril (Cyclobenzaprine) 5mg  up to three times daily for neck pain.  We will set up evaluation with Dr. Tamala Julian in Sports Medicine.  We will also set up an evaluation with Dr. Lucky Cowboy in vascular.

## 2014-04-27 NOTE — Progress Notes (Signed)
Pre visit review using our clinic review tool, if applicable. No additional management support is needed unless otherwise documented below in the visit note. 

## 2014-04-27 NOTE — Progress Notes (Signed)
Subjective:    Patient ID: Terri Werner, female    DOB: 22-Aug-1950, 63 y.o.   MRN: [id number]  HPI 63YO female presents for follow up.  Recently evaluated for right sided neck pain after treatment by massage therapist. She continues to have right sided neck pain that radiates down to her arm. Pain is improved with prn Hydrocodone, however she only rarely takes this because of drowsiness with the medication. No weakness or numbness noted in her arm. She does continue to have intermittent dizziness and nausea.  Reviewed recent MRA cervical spine and brain which was normal except for tortuous right ICA.   Past medical, surgical, family and social history per today's encounter.  Review of Systems  Constitutional: Negative for fever, chills, appetite change, fatigue and unexpected weight change.  Eyes: Negative for visual disturbance.  Respiratory: Negative for shortness of breath.   Cardiovascular: Negative for chest pain and leg swelling.  Gastrointestinal: Positive for nausea. Negative for abdominal pain.  Musculoskeletal: Positive for myalgias and arthralgias.  Skin: Negative for color change and rash.  Neurological: Positive for dizziness. Negative for weakness.  Hematological: Negative for adenopathy. Does not bruise/bleed easily.  Psychiatric/Behavioral: Negative for dysphoric mood. The patient is not nervous/anxious.        Objective:    BP 128/64 mmHg  Pulse 88  Temp(Src) 98.4 F (36.9 C) (Oral)  Resp 16  Ht 5\' 3"  (1.6 m)  Wt 248 lb 3.2 oz (112.583 kg)  BMI 43.98 kg/m2  SpO2 95% Physical Exam  Constitutional: She is oriented to person, place, and time. She appears well-developed and well-nourished. No distress.  HENT:  Head: Normocephalic and atraumatic.  Right Ear: External ear normal.  Left Ear: External ear normal.  Nose: Nose normal.  Mouth/Throat: Oropharynx is clear and moist. No oropharyngeal exudate.  Eyes: Conjunctivae are normal. Pupils  are equal, round, and reactive to light. Right eye exhibits no discharge. Left eye exhibits no discharge. No scleral icterus.  Neck: Normal range of motion. Neck supple. Muscular tenderness present. No spinous process tenderness present. Carotid bruit is not present. No tracheal deviation, no edema, no erythema and normal range of motion present. No thyromegaly present.    Cardiovascular: Normal rate, regular rhythm, normal heart sounds and intact distal pulses.  Exam reveals no gallop and no friction rub.   No murmur heard. Pulmonary/Chest: Effort normal and breath sounds normal. No accessory muscle usage. No tachypnea. No respiratory distress. She has no decreased breath sounds. She has no wheezes. She has no rhonchi. She has no rales. She exhibits no tenderness.  Musculoskeletal: Normal range of motion. She exhibits no edema or tenderness.  Lymphadenopathy:    She has no cervical adenopathy.  Neurological: She is alert and oriented to person, place, and time. She displays no atrophy and no tremor. No cranial nerve deficit or sensory deficit. She exhibits normal muscle tone. Coordination and gait normal.  Skin: Skin is warm and dry. No rash noted. She is not diaphoretic. No erythema. No pallor.  Psychiatric: She has a normal mood and affect. Her behavior is normal. Judgment and thought content normal.          Assessment & Plan:   Problem List Items Addressed This Visit      Unprioritized   Carotid artery disorder    Recent injury to right neck. MRI/MRA showed tortuous right ICA. Discussed with pt that this finding likely not related to recent injury. Will however set up evaluation with vascular  surgery to see if any additional follow up might be warranted.    Relevant Orders      Ambulatory referral to Vascular Surgery   Neck pain on right side - Primary    Right sided neck pain after injury during massage therapy. Exam today remarkable for spasm of the right trapezius. Will start  Flexeril 5mg  po tid prn. Discussed risks of this medication. Will also set up sports med evaluation. Question if local steroid or botox injection might be helpful. Follow up 4 weeks and prn.    Relevant Medications      cyclobenzaprine (FLEXERIL) tablet   Other Relevant Orders      Ambulatory referral to Sports Medicine       Return in about 4 weeks (around 05/25/2014) for Recheck.

## 2014-04-27 NOTE — Assessment & Plan Note (Signed)
Right sided neck pain after injury during massage therapy. Exam today remarkable for spasm of the right trapezius. Will start Flexeril 5mg  po tid prn. Discussed risks of this medication. Will also set up sports med evaluation. Question if local steroid or botox injection might be helpful. Follow up 4 weeks and prn.

## 2014-04-30 ENCOUNTER — Encounter: Payer: Self-pay | Admitting: Internal Medicine

## 2014-05-14 DIAGNOSIS — K5792 Diverticulitis of intestine, part unspecified, without perforation or abscess without bleeding: Secondary | ICD-10-CM

## 2014-05-14 HISTORY — DX: Diverticulitis of intestine, part unspecified, without perforation or abscess without bleeding: K57.92

## 2014-05-20 ENCOUNTER — Ambulatory Visit (INDEPENDENT_AMBULATORY_CARE_PROVIDER_SITE_OTHER): Payer: No Typology Code available for payment source | Admitting: Family Medicine

## 2014-05-20 ENCOUNTER — Ambulatory Visit (INDEPENDENT_AMBULATORY_CARE_PROVIDER_SITE_OTHER)
Admission: RE | Admit: 2014-05-20 | Discharge: 2014-05-20 | Disposition: A | Discharge status: Home / Self Care | Payer: No Typology Code available for payment source | Source: Ambulatory Visit | Attending: Family Medicine | Admitting: Family Medicine

## 2014-05-20 ENCOUNTER — Encounter: Payer: Self-pay | Admitting: Family Medicine

## 2014-05-20 ENCOUNTER — Other Ambulatory Visit (INDEPENDENT_AMBULATORY_CARE_PROVIDER_SITE_OTHER): Payer: No Typology Code available for payment source

## 2014-05-20 VITALS — BP 138/88 | HR 72 | Ht 63.0 in | Wt 244.0 lb

## 2014-05-20 DIAGNOSIS — M25511 Pain in right shoulder: Secondary | ICD-10-CM

## 2014-05-20 DIAGNOSIS — M75101 Unspecified rotator cuff tear or rupture of right shoulder, not specified as traumatic: Secondary | ICD-10-CM

## 2014-05-20 DIAGNOSIS — M542 Cervicalgia: Secondary | ICD-10-CM

## 2014-05-20 DIAGNOSIS — M75100 Unspecified rotator cuff tear or rupture of unspecified shoulder, not specified as traumatic: Secondary | ICD-10-CM | POA: Insufficient documentation

## 2014-05-20 NOTE — Progress Notes (Signed)
[facility] [address], [place] [address] Phone: [phone] Subjective:    I'm seeing this patient by the request  of:  Rica Mast, MD   CC: Right neck pain  OIN:OMVEHMCNOB [place] Terri Werner is a 64 y.o. female coming in with complaint of  right-sided neck pain. Patient did have a massage on October 24. Patient states that person extended her right arm and flexed her neck causing severe pain on the right side. Patient states unfortunately over the course of time it has not gotten any better and if anything is Worse. Patient states certain movement with her neck as well as her shoulder can be uncomfortable. Patient was having significant amount pain and has seen primary care and has had workup including an MRI/MRA was reviewed by me. Overall this is fairly unremarkable for only a torturous right ICA. Patient states that this is affecting her daily activities and makes it difficult to turn her neck certain ways. Patient is sleeping in a recliner because there is too much pain at night. Patient has not responded very well to over-the-counter medications. Patient has tried other home modalities including ice and heat that is minimally beneficial. Denies any weakness or numbness in her arm. States that the pain can radiate down to her arm though in somewhat seems to be localized into her shoulder.     Past medical history, social, surgical and family history all reviewed in electronic medical record.   Review of Systems: No headache, visual changes, nausea, vomiting, diarrhea, constipation, dizziness, abdominal pain, skin rash, fevers, chills, night sweats, weight loss, swollen lymph nodes, body aches, joint swelling, muscle aches, chest pain, shortness of breath, mood changes.   Objective Blood pressure 138/88, pulse 72, height 5\' 3"  (1.6 m), weight 244 lb (110.678 kg), SpO2 97 %.  General: No apparent distress alert and oriented x3  mood and affect normal, dressed appropriately.  HEENT: Pupils equal, extraocular movements intact  Respiratory: Patient's speak in full sentences and does not appear short of breath  Cardiovascular: No lower extremity edema, non tender, no erythema  Skin: Warm dry intact with no signs of infection or rash on extremities or on axial skeleton.  Abdomen: Soft nontender  Neuro: Cranial nerves II through XII are intact, neurovascularly intact in all extremities with 2+ DTRs and 2+ pulses.  Lymph: No lymphadenopathy of posterior or anterior cervical chain or axillae bilaterally.  Gait normal with good balance and coordination.  MSK:  Non tender with full range of motion and good stability and symmetric strength and tone of  elbows, wrist, hip, knee and ankles bilaterally.  Neck: Inspection unremarkable. No palpable stepoffs. Negative Spurling's maneuver. Full neck range of motion passively the patient does have a large trapezius muscle spasm with significant trigger points Grip strength and sensation normal in bilateral hands Strength good C4 to T1 distribution No sensory change to C4 to T1 Negative Hoffman sign bilaterally Reflexes normal Shoulder: Right Inspection reveals no abnormalities, atrophy or asymmetry. Palpation is normal with no tenderness over AC joint or bicipital groove. ROM is full in all planes passively. Rotator cuff strength normal throughout. signs of impingement with positive Neer and Hawkin's tests, but negative empty can sign. Speeds and Yergason's tests normal. No labral pathology noted with negative Obrien's, negative clunk and good stability. Normal scapular function observed. No painful arc and no drop arm sign. No apprehension sign  MSK US performed of: Right This study was ordered, performed, and  interpreted by Charlann Boxer D.O.  Shoulder:   Supraspinatus:  Appears normal on long and transverse views, Bursal bulge seen with shoulder abduction on impingement  view. Infraspinatus:  Appears normal on long and transverse views. Significant increase in Doppler flow Subscapularis:  Patient has calcific changes of the subscapularis near its insertion that likely a healing injury. Teres Minor:  Appears normal on long and transverse views. AC joint:  Capsule undistended, no geyser sign. Glenohumeral Joint:  Appears normal without effusion. Glenoid Labrum:  Intact but overlying hypoechoic changes noted. Biceps Tendon:  Appears normal on long and transverse views, no fraying of tendon, tendon located in intertubercular groove, no subluxation with shoulder internal or external rotation.  Impression: Subacromial bursitis, with healing subscapularis tear and hypoechoic changes of the posterior labrum  Procedure: Real-time Ultrasound Guided Injection of right glenohumeral joint Device: GE Logiq E  Ultrasound guided injection is preferred based studies that show increased duration, increased effect, greater accuracy, decreased procedural pain, increased response rate with ultrasound guided versus blind injection.  Verbal informed consent obtained.  Time-out conducted.  Noted no overlying erythema, induration, or other signs of local infection.  Skin prepped in a sterile fashion.  Local anesthesia: Topical Ethyl chloride.  With sterile technique and under real time ultrasound guidance:  Joint visualized.  23g 1  inch needle inserted posterior approach. Pictures taken for needle placement. Patient did have injection of 2 cc of 1% lidocaine, 2 cc of 0.5% Marcaine, and 1.0 cc of Kenalog 40 mg/dL. Completed without difficulty  Pain immediately resolved suggesting accurate placement of the medication.  Advised to call if fevers/chills, erythema, induration, drainage, or persistent bleeding.  Images permanently stored and available for review in the ultrasound unit.  Impression: Technically successful ultrasound guided injection.       Impression and  Recommendations:     This case required medical decision making of moderate complexity.

## 2014-05-20 NOTE — Assessment & Plan Note (Signed)
Patient was given an injection today. We discussed icing protocol, home exercises, as well as prescription anti-inflammatories and muscle relaxers as needed. Patient's mechanism for injury would likely be an extensive injury that could go along with patient pain when it occurred with patient's massage. This will be difficult to know for sure. We'll get x-rays of the shoulder and the neck today to rule out any other bony abnormality that could be contributing such as a distal radius fracture or cervical arthropathy. Patient will try these conservative therapy and come back in 3-4 weeks for further evaluation if continuing to have pain we can consider trigger point injections in the trapezius.

## 2014-05-20 NOTE — Patient Instructions (Addendum)
Good to see you Rotator cuff tear that is healing but would be caused from a extensor mechanism. Also some labral changes likely from injury.  Meloxicam daily for 1 week.  Ice 20 minutes 2 times daily. Usually after activity and before bed. Exercises 3 times a week. Alternate them.  Vitamin D 2000IU daily.  xrays downstairs today See you again in 2-3 weeks.

## 2014-06-01 ENCOUNTER — Encounter: Payer: Self-pay | Admitting: Internal Medicine

## 2014-06-09 ENCOUNTER — Encounter: Payer: Self-pay | Admitting: Family Medicine

## 2014-06-09 ENCOUNTER — Ambulatory Visit (INDEPENDENT_AMBULATORY_CARE_PROVIDER_SITE_OTHER): Payer: No Typology Code available for payment source | Admitting: Family Medicine

## 2014-06-09 VITALS — BP 148/86 | HR 75 | Ht 63.0 in | Wt 241.0 lb

## 2014-06-09 DIAGNOSIS — M542 Cervicalgia: Secondary | ICD-10-CM

## 2014-06-09 DIAGNOSIS — M9903 Segmental and somatic dysfunction of lumbar region: Secondary | ICD-10-CM

## 2014-06-09 DIAGNOSIS — M75101 Unspecified rotator cuff tear or rupture of right shoulder, not specified as traumatic: Secondary | ICD-10-CM

## 2014-06-09 DIAGNOSIS — M9902 Segmental and somatic dysfunction of thoracic region: Secondary | ICD-10-CM

## 2014-06-09 DIAGNOSIS — M9901 Segmental and somatic dysfunction of cervical region: Secondary | ICD-10-CM

## 2014-06-09 DIAGNOSIS — M999 Biomechanical lesion, unspecified: Secondary | ICD-10-CM | POA: Insufficient documentation

## 2014-06-09 NOTE — Assessment & Plan Note (Signed)
Decision today to treat with OMT was based on Physical Exam  After verbal consent patient was treated with HVLA, ME techniques in cervical, thoracic and rib areas  Patient tolerated the procedure well with improvement in symptoms  Patient given exercises, stretches and lifestyle modifications  See medications in patient instructions if given  Patient will follow up in 3 weeks

## 2014-06-09 NOTE — Assessment & Plan Note (Signed)
With patient having a very thorough workup of her neck I do feel that this is mostly musculoskeletal. Patient did respond fairly well to the osteopathic manipulation. Patient does have anti-inflammatories and muscle relaxants when needed. Patient at this point can come back in 4-6 weeks for further evaluation and treatment. Patient given postural exercises and will try to make these changes.

## 2014-06-09 NOTE — Progress Notes (Signed)
Pre visit review using our clinic review tool, if applicable. No additional management support is needed unless otherwise documented below in the visit note. 

## 2014-06-09 NOTE — Patient Instructions (Signed)
Good to see you Terri Werner is your friend.  Keep doing the other exercises 3 times a week.  On wall with heels, butt shoulder and head touching wall for goal of 5 minutes daily.  When sitting get monitor at eye level.  Put tennisball between shoulder blades when sitting If manipulation help please come back in 4-6 weeks and we will adjust you again.

## 2014-06-09 NOTE — Progress Notes (Signed)
  [facility] [address] [place], [place] [address] Phone: [phone] Subjective:      CC: Right shoulder pain follow-up  NID:[id number] Terri Werner [name] is a 64 y.o. female coming in for follow-up of right shoulder pain. Patient was seen previously for neck pain and it was found to be more isolated to her shoulder. Patient did have subacromial bursitis with degenerative changes of the rotator cuff as well as the possibility of a labral tear. Patient elected to have an injection. Patient states that she is now doing approximately 75% better. Continues the exercises and did find the over-the-counter natural supplementations. Patient is very happy with the results so far. Denies any nighttime awakenings.     Past medical history, social, surgical and family history all reviewed in electronic medical record.   Review of Systems: No headache, visual changes, nausea, vomiting, diarrhea, constipation, dizziness, abdominal pain, skin rash, fevers, chills, night sweats, weight loss, swollen lymph nodes, body aches, joint swelling, muscle aches, chest pain, shortness of breath, mood changes.   Objective There were no vitals taken for this visit.  General: No apparent distress alert and oriented x3 mood and affect normal, dressed appropriately.  HEENT: Pupils equal, extraocular movements intact  Respiratory: Patient's speak in full sentences and does not appear short of breath  Cardiovascular: No lower extremity edema, non tender, no erythema  Skin: Warm dry intact with no signs of infection or rash on extremities or on axial skeleton.  Abdomen: Soft nontender  Neuro: Cranial nerves II through XII are intact, neurovascularly intact in all extremities with 2+ DTRs and 2+ pulses.  Lymph: No lymphadenopathy of posterior or anterior cervical chain or axillae bilaterally.  Gait normal with good balance and coordination.  MSK:  Non tender with full range of  motion and good stability and symmetric strength and tone of  elbows, wrist, hip, knee and ankles bilaterally.  Neck: Inspection unremarkable. No palpable stepoffs. Negative Spurling's maneuver. Full neck range of motion passively the patient does have a large trapezius muscle spasm with significant trigger points Grip strength and sensation normal in bilateral hands Strength good C4 to T1 distribution No sensory change to C4 to T1 Negative Hoffman sign bilaterally Reflexes normal Shoulder: Right Inspection reveals no abnormalities, atrophy or asymmetry. Palpation is normal with no tenderness over AC joint or bicipital groove. ROM is full in all planes passively. Rotator cuff strength normal throughout. signs of impingement with positive Neer and Hawkin's tests, but negative empty can sign. Improved from previous exam Speeds and Yergason's tests normal. No labral pathology noted with negative Obrien's, negative clunk and good stability. Normal scapular function observed. No painful arc and no drop arm sign. No apprehension sign    Osteopathic findings Cervical C2 flexed rotated and side bent left Thoracic T3 extended rotated and side bent right with inhaled third rib T5 extended rotated and side bent right      Impression and Recommendations:     This case required medical decision making of moderate complexity.

## 2014-06-09 NOTE — Assessment & Plan Note (Signed)
She is doing much better at this time. Encourage patient to continue the exercises. Patient did respond fairly well with patient's neck pain to osteopathic manipulation today. We like patient to come back again in 4-6 weeks to make sure she is nearly pain free at that time.

## 2014-06-22 ENCOUNTER — Emergency Department: Payer: Self-pay | Admitting: Student

## 2014-07-05 ENCOUNTER — Encounter: Payer: Self-pay | Admitting: Internal Medicine

## 2014-07-05 ENCOUNTER — Ambulatory Visit (INDEPENDENT_AMBULATORY_CARE_PROVIDER_SITE_OTHER): Payer: No Typology Code available for payment source | Admitting: Internal Medicine

## 2014-07-05 VITALS — BP 127/77 | HR 77 | Temp 98.2°F | Ht 62.5 in | Wt 242.2 lb

## 2014-07-05 DIAGNOSIS — K5732 Diverticulitis of large intestine without perforation or abscess without bleeding: Secondary | ICD-10-CM

## 2014-07-05 MED ORDER — MELOXICAM 15 MG PO TABS: 15.0000 mg | ORAL_TABLET | Freq: Every day | ORAL | Status: DC | PRN

## 2014-07-05 NOTE — Assessment & Plan Note (Signed)
S/p treatment for diverticulitis. Reviewed notes from ED and CT abdomen. Will recheck WBC given elevated WBC 17K on labs. Recheck CMP. Encouraged fluid intake. Encouraged fiber intake and use of Miralax as needed to keep stools soft. Avoid small particles in diet. Will set up follow up evaluation with GI.

## 2014-07-05 NOTE — Patient Instructions (Signed)

## 2014-07-05 NOTE — Progress Notes (Signed)
Subjective:    Patient ID: Terri Werner, female    DOB: 1950/12/14, 64 y.o.   MRN: [id number]  HPI 63YO female presents for ED follow up.  Diverticulitis - Seen in [facility] ED for abdominal pain 06/22/2014. CT showed diverticulitis. Started on Cipro and Flagyl. Pain has improved, however continues to have some soreness over lower abdomen. Tolerating full diet. Avoiding small particles. No NVD. No blood in stool  Past medical, surgical, family and social history per today's encounter.  Review of Systems  Constitutional: Negative for fever, chills, appetite change, fatigue and unexpected weight change.  Eyes: Negative for visual disturbance.  Respiratory: Negative for shortness of breath.   Cardiovascular: Negative for chest pain and leg swelling.  Gastrointestinal: Positive for abdominal pain. Negative for nausea, vomiting, diarrhea, constipation, abdominal distention and rectal pain.  Skin: Negative for color change and rash.  Hematological: Negative for adenopathy. Does not bruise/bleed easily.  Psychiatric/Behavioral: Negative for dysphoric mood. The patient is not nervous/anxious.        Objective:    BP 127/77 mmHg  Pulse 77  Temp(Src) 98.2 F (36.8 C) (Oral)  Ht 5' 2.5" (1.588 m)  Wt 242 lb 4 oz (109.884 kg)  BMI 43.57 kg/m2  SpO2 99% Physical Exam  Constitutional: She is oriented to person, place, and time. She appears well-developed and well-nourished. No distress.  HENT:  Head: Normocephalic and atraumatic.  Right Ear: External ear normal.  Left Ear: External ear normal.  Nose: Nose normal.  Mouth/Throat: Oropharynx is clear and moist.  Eyes: Conjunctivae and EOM are normal. Pupils are equal, round, and reactive to light. Right eye exhibits no discharge.  Neck: Normal range of motion. Neck supple. No thyromegaly present.  Cardiovascular: Normal rate, regular rhythm, normal heart sounds and intact distal pulses.  Exam reveals no gallop and no friction  rub.   No murmur heard. Pulmonary/Chest: Effort normal. No respiratory distress. She has no wheezes. She has no rales.  Abdominal: Soft. Bowel sounds are normal. She exhibits no distension and no mass. There is tenderness (mild LLQ). There is no rebound and no guarding.  Musculoskeletal: Normal range of motion. She exhibits no edema or tenderness.  Lymphadenopathy:    She has no cervical adenopathy.  Neurological: She is alert and oriented to person, place, and time. No cranial nerve deficit. Coordination normal.  Skin: Skin is warm and dry. No rash noted. She is not diaphoretic. No erythema. No pallor.  Psychiatric: She has a normal mood and affect. Her behavior is normal. Judgment and thought content normal.          Assessment & Plan:   Problem List Items Addressed This Visit      Unprioritized   Diverticulitis of colon - Primary    S/p treatment for diverticulitis. Reviewed notes from ED and CT abdomen. Will recheck WBC given elevated WBC 17K on labs. Recheck CMP. Encouraged fluid intake. Encouraged fiber intake and use of Miralax as needed to keep stools soft. Avoid small particles in diet. Will set up follow up evaluation with GI.      Relevant Orders   Comprehensive metabolic panel   CBC with Differential/Platelet   Ambulatory referral to Gastroenterology       Return in about 3 months (around 10/03/2014) for Recheck.

## 2014-07-05 NOTE — Progress Notes (Signed)
Pre visit review using our clinic review tool, if applicable. No additional management support is needed unless otherwise documented below in the visit note. 

## 2014-07-06 LAB — CBC WITH DIFFERENTIAL/PLATELET
Basophils Absolute: 0 10*3/uL (ref 0.0–0.1)
Basophils Relative: 0.5 % (ref 0.0–3.0)
Eosinophils Absolute: 0.2 10*3/uL (ref 0.0–0.7)
Eosinophils Relative: 1.9 % (ref 0.0–5.0)
HCT: 36.9 % (ref 36.0–46.0)
Hemoglobin: 12.3 g/dL (ref 12.0–15.0)
Lymphocytes Relative: 22.2 % (ref 12.0–46.0)
Lymphs Abs: 1.9 10*3/uL (ref 0.7–4.0)
MCHC: 33.3 g/dL (ref 30.0–36.0)
MCV: 85 fl (ref 78.0–100.0)
Monocytes Absolute: 0.8 10*3/uL (ref 0.1–1.0)
Monocytes Relative: 9.2 % (ref 3.0–12.0)
Neutro Abs: 5.6 10*3/uL (ref 1.4–7.7)
Neutrophils Relative %: 66.2 % (ref 43.0–77.0)
Platelets: 315 10*3/uL (ref 150.0–400.0)
RBC: 4.34 Mil/uL (ref 3.87–5.11)
RDW: 16.9 % — ABNORMAL HIGH (ref 11.5–15.5)
WBC: 8.4 10*3/uL (ref 4.0–10.5)

## 2014-07-06 LAB — COMPREHENSIVE METABOLIC PANEL
ALT: 24 U/L (ref 0–35)
AST: 25 U/L (ref 0–37)
Albumin: 3.8 g/dL (ref 3.5–5.2)
Alkaline Phosphatase: 52 U/L (ref 39–117)
BUN: 15 mg/dL (ref 6–23)
CO2: 31 mEq/L (ref 19–32)
Calcium: 9.4 mg/dL (ref 8.4–10.5)
Chloride: 106 mEq/L (ref 96–112)
Creatinine, Ser: 0.89 mg/dL (ref 0.40–1.20)
GFR: 82.53 mL/min (ref >60.00)
Glucose, Bld: 100 mg/dL — ABNORMAL HIGH (ref 70–99)
Potassium: 4.4 mEq/L (ref 3.5–5.1)
Sodium: 142 mEq/L (ref 135–145)
Total Bilirubin: 0.3 mg/dL (ref 0.2–1.2)
Total Protein: 7.4 g/dL (ref 6.0–8.3)

## 2014-07-14 ENCOUNTER — Encounter: Payer: Self-pay | Admitting: Family Medicine

## 2014-07-14 ENCOUNTER — Ambulatory Visit (INDEPENDENT_AMBULATORY_CARE_PROVIDER_SITE_OTHER): Payer: No Typology Code available for payment source | Admitting: Family Medicine

## 2014-07-14 VITALS — BP 132/80 | HR 85 | Ht 62.5 in | Wt 237.0 lb

## 2014-07-14 DIAGNOSIS — M9903 Segmental and somatic dysfunction of lumbar region: Secondary | ICD-10-CM

## 2014-07-14 DIAGNOSIS — M9901 Segmental and somatic dysfunction of cervical region: Secondary | ICD-10-CM

## 2014-07-14 DIAGNOSIS — M9902 Segmental and somatic dysfunction of thoracic region: Secondary | ICD-10-CM

## 2014-07-14 DIAGNOSIS — M75101 Unspecified rotator cuff tear or rupture of right shoulder, not specified as traumatic: Secondary | ICD-10-CM

## 2014-07-14 DIAGNOSIS — M542 Cervicalgia: Secondary | ICD-10-CM

## 2014-07-14 DIAGNOSIS — M999 Biomechanical lesion, unspecified: Secondary | ICD-10-CM

## 2014-07-14 NOTE — Assessment & Plan Note (Signed)
Seems healed at this time.

## 2014-07-14 NOTE — Progress Notes (Signed)
  [facility] [place], [place] [address] Phone: [phone] Subjective:      CC: Right shoulder pain follow-up  [id number] Terri Werner [name] is a 64 y.o. female coming in for follow-up of right shoulder pain. Patient was seen previously for neck pain and it was found to be more isolated to her shoulder. Patient did have subacromial bursitis with degenerative changes of the rotator cuff as well as the possibility of a labral tear. Patient elected to have an injection to visits ago and patient was doing significantly better. Patient states  Patient also has some neck pain. Patient did respond fairly well to osteopathic manipulation. Patient was given postural exercises. Patient states she is doing significant better. Still has a dull throbbing aching sensation from time to time in the neck but overall she is been able to do a lot more activity. Patient has been doing the postural exercises very regularly she states.     Past medical history, social, surgical and family history all reviewed in electronic medical record.   Review of Systems: No headache, visual changes, nausea, vomiting, diarrhea, constipation, dizziness, abdominal pain, skin rash, fevers, chills, night sweats, weight loss, swollen lymph nodes, body aches, joint swelling, muscle aches, chest pain, shortness of breath, mood changes.   Objective Blood pressure 132/80, pulse 85, height 5' 2.5" (1.588 m), weight 237 lb (107.502 kg), SpO2 96 %.  General: No apparent distress alert and oriented x3 mood and affect normal, dressed appropriately.  HEENT: Pupils equal, extraocular movements intact  Respiratory: Patient's speak in full sentences and does not appear short of breath  Cardiovascular: No lower extremity edema, non tender, no erythema  Skin: Warm dry intact with no signs of infection or rash on extremities or on axial skeleton.  Abdomen: Soft nontender  Neuro: Cranial  nerves II through XII are intact, neurovascularly intact in all extremities with 2+ DTRs and 2+ pulses.  Lymph: No lymphadenopathy of posterior or anterior cervical chain or axillae bilaterally.  Gait normal with good balance and coordination.  MSK:  Non tender with full range of motion and good stability and symmetric strength and tone of  elbows, wrist, hip, knee and ankles bilaterally.  Neck: Inspection unremarkable. No palpable stepoffs. Negative Spurling's maneuver. Full neck range of motion passively the patient does have a large trapezius muscle spasm with significant trigger points Grip strength and sensation normal in bilateral hands Strength good C4 to T1 distribution No sensory change to C4 to T1 Negative Hoffman sign bilaterally Reflexes normal Shoulder: Right Inspection reveals no abnormalities, atrophy or asymmetry. Palpation is normal with no tenderness over AC joint or bicipital groove. ROM is full in all planes passively. Rotator cuff strength normal throughout. Negative impingement signs Speeds and Yergason's tests normal. No labral pathology noted with negative Obrien's, negative clunk and good stability. Normal scapular function observed. No painful arc and no drop arm sign. No apprehension sign    Osteopathic findings Cervical C2 flexed rotated and side bent left Thoracic T3 extended rotated and side bent right with inhaled third rib T5 extended rotated and side bent right Lumbar L2 flexed rotated and side bent right     Impression and Recommendations:     This case required medical decision making of moderate complexity.

## 2014-07-14 NOTE — Assessment & Plan Note (Signed)
Decision today to treat with OMT was based on Physical Exam  After verbal consent patient was treated with HVLA, ME techniques in cervical, thoracic and rib and lumbar areas  Patient tolerated the procedure well with improvement in symptoms  Patient given exercises, stretches and lifestyle modifications  See medications in patient instructions if given  Patient will follow up in 4-5 weeks

## 2014-07-14 NOTE — Assessment & Plan Note (Signed)
Patient is doing much better and I think postural exercises will be very beneficial. We discussed the icing regimen and patient will come back again in 4-5 weeks. I think patient is going to do very well with conservative therapy.

## 2014-07-14 NOTE — Progress Notes (Signed)
Pre visit review using our clinic review tool, if applicable. No additional management support is needed unless otherwise documented below in the visit note. 

## 2014-07-14 NOTE — Patient Instructions (Signed)
You are doing great overall Try to do the posture exercises regularly.  Continue vitamins  You are doing so great will see you in 4-5 weeks.

## 2014-08-11 ENCOUNTER — Ambulatory Visit (INDEPENDENT_AMBULATORY_CARE_PROVIDER_SITE_OTHER): Payer: No Typology Code available for payment source | Admitting: Family Medicine

## 2014-08-11 ENCOUNTER — Encounter: Payer: Self-pay | Admitting: Family Medicine

## 2014-08-11 VITALS — BP 118/80 | HR 74 | Ht 62.5 in | Wt 238.0 lb

## 2014-08-11 DIAGNOSIS — M542 Cervicalgia: Secondary | ICD-10-CM

## 2014-08-11 DIAGNOSIS — M999 Biomechanical lesion, unspecified: Secondary | ICD-10-CM

## 2014-08-11 DIAGNOSIS — M9902 Segmental and somatic dysfunction of thoracic region: Secondary | ICD-10-CM | POA: Diagnosis not present

## 2014-08-11 NOTE — Assessment & Plan Note (Signed)
Patient's neck pain seems to be still multifactorial. We discussed icing and home exercises. We discussed range of motion exercises as well that can be beneficial. Patient will continue to make sure that ergonomic changes at work. Patient will then come back and see me again in 5-6 weeks for further evaluation and treatment.

## 2014-08-11 NOTE — Progress Notes (Signed)
  [facility] [place], [place] [address] Phone: [phone] Subjective:      CC: Follow-up right shoulder and neck pain  [id number] Terri Werner [name] is a 64 y.o. female coming in for follow-up of right shoulder pain. Patient did have some degenerative changes of rotator cuff and had an injection multiple months ago. Patient states that the shoulder continues to do relatively well with some very mild radiation down the arm.  Patient also has some neck pain. Patient has responded well to osteopathic manipulation in the past. Patient continued to do the exercises intermittently. Patient states as long as she changes position at work she seems to do relatively well. Patient states that she avoids repetitive activity she does well as well. Nothing that his stopping her from any daily activities and denies any nighttime awakening. Attempting to try to change sleeping position but has had no difficulty sleeping.     Past medical history, social, surgical and family history all reviewed in electronic medical record.   Review of Systems: No headache, visual changes, nausea, vomiting, diarrhea, constipation, dizziness, abdominal pain, skin rash, fevers, chills, night sweats, weight loss, swollen lymph nodes, body aches, joint swelling, muscle aches, chest pain, shortness of breath, mood changes.   Objective Blood pressure 118/80, pulse 74, height 5' 2.5" (1.588 m), weight 238 lb (107.956 kg), SpO2 97 %.  General: No apparent distress alert and oriented x3 mood and affect normal, dressed appropriately.  HEENT: Pupils equal, extraocular movements intact  Respiratory: Patient's speak in full sentences and does not appear short of breath  Cardiovascular: No lower extremity edema, non tender, no erythema  Skin: Warm dry intact with no signs of infection or rash on extremities or on axial skeleton.  Abdomen: Soft nontender  Neuro: Cranial nerves II  through XII are intact, neurovascularly intact in all extremities with 2+ DTRs and 2+ pulses.  Lymph: No lymphadenopathy of posterior or anterior cervical chain or axillae bilaterally.  Gait normal with good balance and coordination.  MSK:  Non tender with full range of motion and good stability and symmetric strength and tone of  elbows, wrist, hip, knee and ankles bilaterally.  Neck: Inspection unremarkable. No palpable stepoffs. Negative Spurling's maneuver. Full neck range of motion passively  Patient will longer has trigger points in the trapezius. Grip strength and sensation normal in bilateral hands Strength good C4 to T1 distribution No sensory change to C4 to T1 Negative Hoffman sign bilaterally Reflexes normal     Osteopathic findings Cervical C2 flexed rotated and side bent left Thoracic T3 extended rotated and side bent right with inhaled third rib T5 extended rotated and side bent right Lumbar L2 flexed rotated and side bent right Same as previous.    Impression and Recommendations:     This case required medical decision making of moderate complexity.

## 2014-08-11 NOTE — Assessment & Plan Note (Signed)
Decision today to treat with OMT was based on Physical Exam  After verbal consent patient was treated with HVLA, ME techniques in cervical, thoracic and rib and lumbar areas  Patient tolerated the procedure well with improvement in symptoms  Patient given exercises, stretches and lifestyle modifications  See medications in patient instructions if given  Patient will follow up in 5-6 weeks

## 2014-08-11 NOTE — Patient Instructions (Addendum)
Good to see you Continue with ice as your friend Exercise and posture Continue the vitamins See me again in 5-6 weeks.

## 2014-08-11 NOTE — Progress Notes (Signed)
Pre visit review using our clinic review tool, if applicable. No additional management support is needed unless otherwise documented below in the visit note. 

## 2014-08-24 IMAGING — MG MM DIGITAL DIAGNOSTIC UNILAT*L*
2 series · 2 of 2 positions shown · non-contrast
Comparison: Priors dating back to 5665

CLINICAL DATA: Patient recalled from screening for left breast
asymmetry.

EXAM:
DIGITAL DIAGNOSTIC  LEFT MAMMOGRAM
ULTRASOUND LEFT BREAST

[L CC]
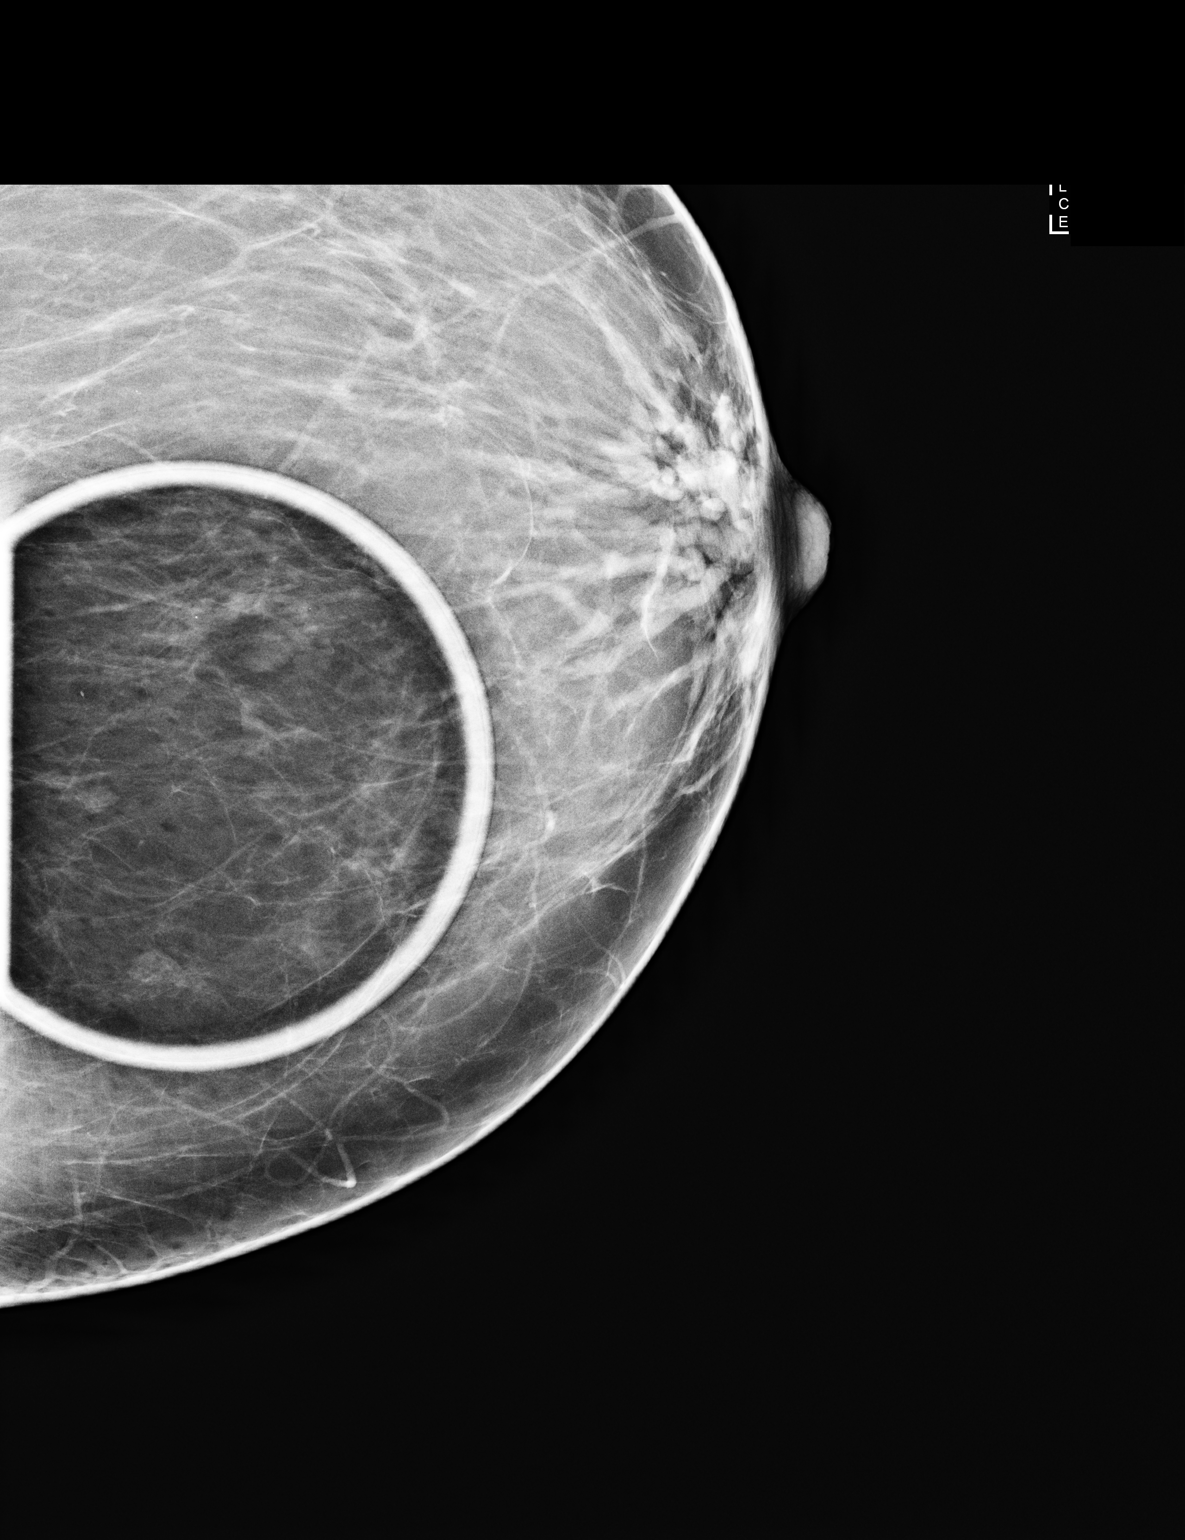

[L MLO]
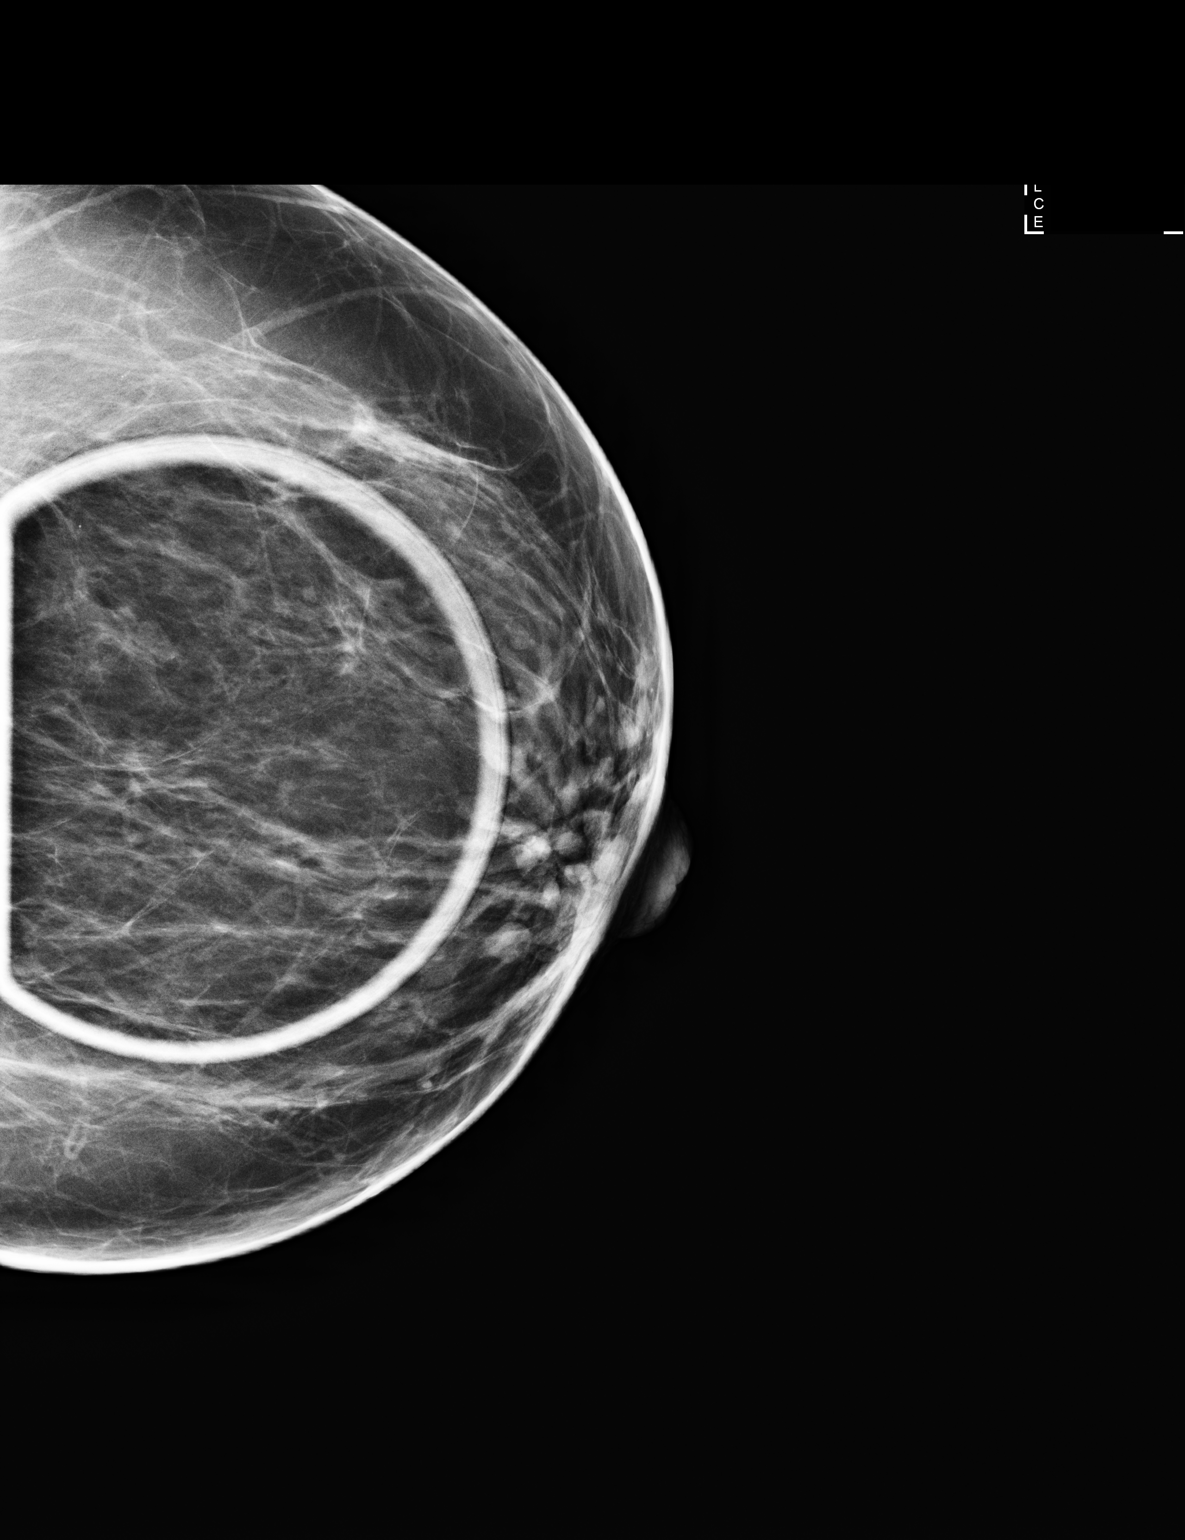

[2 of 2 positions shown; findings below may reference images not displayed]

ACR Breast Density Category b: There are scattered areas of
fibroglandular density.
FINDINGS: Questioned asymmetry within the left breast predominately resolved
with additional imaging. Left breast parenchymal pattern is stable
dating back to 5665.

On physical exam, I palpate no discrete mass within the medial left
breast.

Ultrasound is performed, showing no concerning mass within the
medial left breast at the site of questioned asymmetry.
IMPRESSION: Questioned asymmetry resolved with additional imaging compatible
with overlapping fibroglandular breast tissue.

RECOMMENDATION:
Screening mammogram in one year.(Code:35-I-UDR)

I have discussed the findings and recommendations with the patient.
Results were also provided in writing at the conclusion of the
visit. If applicable, a reminder letter will be sent to the patient
regarding the next appointment.

BI-RADS CATEGORY  1: Negative.

## 2014-09-15 ENCOUNTER — Ambulatory Visit: Payer: No Typology Code available for payment source | Admitting: Family Medicine

## 2014-09-21 ENCOUNTER — Other Ambulatory Visit: Payer: Self-pay

## 2014-09-21 DIAGNOSIS — Z1231 Encounter for screening mammogram for malignant neoplasm of breast: Secondary | ICD-10-CM

## 2014-09-22 ENCOUNTER — Ambulatory Visit: Payer: No Typology Code available for payment source | Admitting: Family Medicine

## 2014-09-30 ENCOUNTER — Encounter: Payer: Self-pay | Admitting: Family Medicine

## 2014-09-30 ENCOUNTER — Ambulatory Visit (INDEPENDENT_AMBULATORY_CARE_PROVIDER_SITE_OTHER): Payer: Managed Care, Other (non HMO) | Admitting: Family Medicine

## 2014-09-30 VITALS — BP 122/82 | HR 79 | Ht 62.5 in | Wt 238.0 lb

## 2014-09-30 DIAGNOSIS — M9902 Segmental and somatic dysfunction of thoracic region: Secondary | ICD-10-CM

## 2014-09-30 DIAGNOSIS — M9903 Segmental and somatic dysfunction of lumbar region: Secondary | ICD-10-CM

## 2014-09-30 DIAGNOSIS — M542 Cervicalgia: Secondary | ICD-10-CM | POA: Diagnosis not present

## 2014-09-30 DIAGNOSIS — M999 Biomechanical lesion, unspecified: Secondary | ICD-10-CM

## 2014-09-30 DIAGNOSIS — M9901 Segmental and somatic dysfunction of cervical region: Secondary | ICD-10-CM | POA: Diagnosis not present

## 2014-09-30 NOTE — Assessment & Plan Note (Signed)
Decision today to treat with OMT was based on Physical Exam  After verbal consent patient was treated with HVLA, ME techniques in cervical, thoracic and rib and lumbar areas  Patient tolerated the procedure well with improvement in symptoms  Patient given exercises, stretches and lifestyle modifications  See medications in patient instructions if given  Patient will follow up in 8 weeks

## 2014-09-30 NOTE — Assessment & Plan Note (Signed)
Continue home exercises and the postural changes. Patient has been doing very well but we will space her at 8 week intervals for her osteopathic manipulation. We discussed also new postural exercises that I think she can do throughout the day to help her so when she is too tired at night she is doing relatively better. Patient will call if she has any type of exacerbation in the interim.

## 2014-09-30 NOTE — Progress Notes (Signed)
Pre visit review using our clinic review tool, if applicable. No additional management support is needed unless otherwise documented below in the visit note. 

## 2014-09-30 NOTE — Progress Notes (Signed)
  [facility] [place], [place] [address] Phone: [phone] Subjective:      CC: Follow-up right shoulder and neck pain  [id number] Teja Costen Terri Werner is a 64 y.o. female coming in for follow-up of right shoulder pain. Patient did have some degenerative changes of rotator cuff and had an injection multiple months ago. No pain overall  Patient also has some neck pain. Patient has responded well to osteopathic manipulation in the past. Patient continued to do the exercises intermittently. Patient states as long as she changes position at work she seems to do relatively well. Patient continues to state that she does relatively well as long as she remembers the exercises. Denies any new symptoms.     Past medical history, social, surgical and family history all reviewed in electronic medical record.   Review of Systems: No headache, visual changes, nausea, vomiting, diarrhea, constipation, dizziness, abdominal pain, skin rash, fevers, chills, night sweats, weight loss, swollen lymph nodes, body aches, joint swelling, muscle aches, chest pain, shortness of breath, mood changes.   Objective Blood pressure 122/82, pulse 79, height 5' 2.5" (1.588 m), weight 238 lb (107.956 kg), SpO2 98 %.  General: No apparent distress alert and oriented x3 mood and affect normal, dressed appropriately.  HEENT: Pupils equal, extraocular movements intact  Respiratory: Patient's speak in full sentences and does not appear short of breath  Cardiovascular: No lower extremity edema, non tender, no erythema  Skin: Warm dry intact with no signs of infection or rash on extremities or on axial skeleton.  Abdomen: Soft nontender  Neuro: Cranial nerves II through XII are intact, neurovascularly intact in all extremities with 2+ DTRs and 2+ pulses.  Lymph: No lymphadenopathy of posterior or anterior cervical chain or axillae bilaterally.  Gait normal with good balance  and coordination.  MSK:  Non tender with full range of motion and good stability and symmetric strength and tone of  elbows, wrist, hip, knee and ankles bilaterally.  Neck: Inspection unremarkable. No palpable stepoffs. Negative Spurling's maneuver. Full neck range of motion passively  Patient will longer has trigger points in the trapezius. Grip strength and sensation normal in bilateral hands Strength good C4 to T1 distribution No sensory change to C4 to T1 Negative Hoffman sign bilaterally Reflexes normal Continues to do well.  Osteopathic findings Cervical C2 flexed rotated and side bent left Thoracic T3 extended rotated and side bent right with inhaled third rib T5 extended rotated and side bent right Lumbar L2 flexed rotated and side bent right Same as previous. Responded better to osteopathic separation.    Impression and Recommendations:     This case required medical decision making of moderate complexity.

## 2014-09-30 NOTE — Patient Instructions (Addendum)
Good to see you as always.  Ice will continue to be your friend.  Still work on posture Give yourself a pat on the back.  See me again in 8 weeks.

## 2014-10-13 ENCOUNTER — Ambulatory Visit
Admission: RE | Admit: 2014-10-13 | Discharge: 2014-10-13 | Disposition: A | Discharge status: Home / Self Care | Payer: Managed Care, Other (non HMO) | Source: Ambulatory Visit

## 2014-10-13 DIAGNOSIS — Z1231 Encounter for screening mammogram for malignant neoplasm of breast: Secondary | ICD-10-CM

## 2014-10-29 ENCOUNTER — Encounter: Payer: Self-pay | Admitting: *Deleted

## 2014-11-01 ENCOUNTER — Ambulatory Visit: Payer: Managed Care, Other (non HMO) | Admitting: Anesthesiology

## 2014-11-01 ENCOUNTER — Encounter
Admission: RE | Disposition: A | Discharge status: Home / Self Care | Payer: Self-pay | Source: Ambulatory Visit | Attending: Unknown Physician Specialty

## 2014-11-01 ENCOUNTER — Ambulatory Visit
Admission: RE | Admit: 2014-11-01 | Discharge: 2014-11-01 | Disposition: A | Discharge status: Home / Self Care | Payer: Managed Care, Other (non HMO) | Source: Ambulatory Visit | Attending: Unknown Physician Specialty | Admitting: Unknown Physician Specialty

## 2014-11-01 DIAGNOSIS — M1990 Unspecified osteoarthritis, unspecified site: Secondary | ICD-10-CM | POA: Insufficient documentation

## 2014-11-01 DIAGNOSIS — K5730 Diverticulosis of large intestine without perforation or abscess without bleeding: Secondary | ICD-10-CM | POA: Diagnosis not present

## 2014-11-01 DIAGNOSIS — B1910 Unspecified viral hepatitis B without hepatic coma: Secondary | ICD-10-CM | POA: Insufficient documentation

## 2014-11-01 DIAGNOSIS — R011 Cardiac murmur, unspecified: Secondary | ICD-10-CM | POA: Insufficient documentation

## 2014-11-01 DIAGNOSIS — D125 Benign neoplasm of sigmoid colon: Secondary | ICD-10-CM | POA: Insufficient documentation

## 2014-11-01 DIAGNOSIS — I739 Peripheral vascular disease, unspecified: Secondary | ICD-10-CM | POA: Insufficient documentation

## 2014-11-01 DIAGNOSIS — Z86010 Personal history of colonic polyps: Secondary | ICD-10-CM | POA: Diagnosis not present

## 2014-11-01 DIAGNOSIS — Z7982 Long term (current) use of aspirin: Secondary | ICD-10-CM | POA: Insufficient documentation

## 2014-11-01 DIAGNOSIS — Z1211 Encounter for screening for malignant neoplasm of colon: Secondary | ICD-10-CM | POA: Insufficient documentation

## 2014-11-01 DIAGNOSIS — K219 Gastro-esophageal reflux disease without esophagitis: Secondary | ICD-10-CM | POA: Diagnosis not present

## 2014-11-01 DIAGNOSIS — Z91013 Allergy to seafood: Secondary | ICD-10-CM | POA: Diagnosis not present

## 2014-11-01 DIAGNOSIS — Z79899 Other long term (current) drug therapy: Secondary | ICD-10-CM | POA: Diagnosis not present

## 2014-11-01 DIAGNOSIS — I10 Essential (primary) hypertension: Secondary | ICD-10-CM | POA: Insufficient documentation

## 2014-11-01 DIAGNOSIS — Z79891 Long term (current) use of opiate analgesic: Secondary | ICD-10-CM | POA: Insufficient documentation

## 2014-11-01 DIAGNOSIS — E876 Hypokalemia: Secondary | ICD-10-CM | POA: Diagnosis not present

## 2014-11-01 DIAGNOSIS — K648 Other hemorrhoids: Secondary | ICD-10-CM | POA: Diagnosis not present

## 2014-11-01 DIAGNOSIS — Z91040 Latex allergy status: Secondary | ICD-10-CM | POA: Diagnosis not present

## 2014-11-01 HISTORY — DX: Personal history of other diseases of the digestive system: Z87.19

## 2014-11-01 HISTORY — PX: COLONOSCOPY WITH PROPOFOL: SHX5780

## 2014-11-01 SURGERY — COLONOSCOPY WITH PROPOFOL
Anesthesia: General

## 2014-11-01 MED ORDER — PROPOFOL 10 MG/ML IV BOLUS
INTRAVENOUS | Status: DC | PRN
  Administered 2014-11-01: 50 mg via INTRAVENOUS

## 2014-11-01 MED ORDER — MIDAZOLAM HCL 5 MG/5ML IJ SOLN
INTRAMUSCULAR | Status: DC | PRN
  Administered 2014-11-01: 1 mg via INTRAVENOUS

## 2014-11-01 MED ORDER — LIDOCAINE HCL (PF) 2 % IJ SOLN
INTRAMUSCULAR | Status: DC | PRN
  Administered 2014-11-01: 50 mg

## 2014-11-01 MED ORDER — FENTANYL CITRATE (PF) 100 MCG/2ML IJ SOLN
INTRAMUSCULAR | Status: DC | PRN
Start: 2014-11-01 — End: 2014-11-01
  Administered 2014-11-01: 50 ug via INTRAVENOUS

## 2014-11-01 MED ORDER — PROPOFOL INFUSION 10 MG/ML OPTIME
INTRAVENOUS | Status: DC | PRN
  Administered 2014-11-01: 100 ug/kg/min via INTRAVENOUS

## 2014-11-01 MED ORDER — SODIUM CHLORIDE 0.9 % IV SOLN: INTRAVENOUS | Status: DC

## 2014-11-01 MED ORDER — LACTATED RINGERS IV SOLN
INTRAVENOUS | Status: DC
  Administered 2014-11-01: 1000 mL via INTRAVENOUS

## 2014-11-01 NOTE — Transfer of Care (Signed)
Immediate Anesthesia Transfer of Care Note  Patient: Terri Werner  Procedure(s) Performed: Procedure(s): COLONOSCOPY WITH PROPOFOL (N/A)  Patient Location: PACU  Anesthesia Type:General  Level of Consciousness: sedated  Airway & Oxygen Therapy: Patient Spontanous Breathing and Patient connected to nasal cannula oxygen  Post-op Assessment: Report given to RN and Post -op Vital signs reviewed and stable  Post vital signs: Reviewed and stable  Last Vitals:  Filed Vitals:   11/01/14 0943  BP: 151/73  Pulse: 72  Temp: 36.9 C  Resp: 18    Complications: No apparent anesthesia complications

## 2014-11-01 NOTE — H&P (Signed)
Primary Care Physician:  Rica Mast, MD Primary Gastroenterologist:  Dr. Vira Agar  Pre-Procedure History & Physical: HPI:  Terri Werner is a 64 y.o. female is here for an colonoscopy.   Past Medical History  Diagnosis Date  . Heart murmur   . Hypertension   . Arthritis     pt reports in back & knees  . GERD (gastroesophageal reflux disease)   . History of chicken pox   . Hx of migraine headaches   . History of blood transfusion     during each major surgery per pt  . Genital warts   . Hepatitis B   . HTN (hypertension)   . Colon polyps 2010    At [facility]  . History of torn meniscus of right knee   . Hypopotassemia   . History of hiatal hernia     Past Surgical History  Procedure Laterality Date  . Tonsillectomy    . Vesicovaginal fistula closure w/ tah    . Cesarean section    . Laproscopic left knee repair  1971  . Ectopic pregnancy surgery    . Total abdominal hysterectomy w/ bilateral salpingoophorectomy  2006  . Knee arthroscopy    . Appendectomy  1980  . Abdominal hysterectomy    . Hernia repair      Prior to Admission medications   Medication Sig Start Date End Date Taking? Authorizing Provider  amLODipine (NORVASC) 10 MG tablet Take 1 tablet (10 mg total) by mouth daily. 09/30/13  Yes Minna Merritts, MD  aspirin 81 MG tablet Take 81 mg by mouth daily.   Yes Historical Provider, MD  Cholecalciferol (VITAMIN D) 2000 UNITS CAPS Take 2,000 Units by mouth daily.   Yes Historical Provider, MD  Magnesium 250 MG TABS Take 250 mg by mouth daily.   Yes Historical Provider, MD  meloxicam (MOBIC) 15 MG tablet Take 1 tablet (15 mg total) by mouth daily as needed. Patient taking differently: Take 15 mg by mouth daily as needed for pain.  07/05/14  Yes Jackolyn Confer, MD  nystatin cream (MYCOSTATIN) Apply 1 application topically 2 (two) times daily.   Yes Historical Provider, MD  pravastatin (PRAVACHOL) 20 MG tablet take 1 tablet by mouth once  daily at bedtime 04/05/14  Yes Minna Merritts, MD  cyclobenzaprine (FLEXERIL) 5 MG tablet Take 1 tablet (5 mg total) by mouth 3 (three) times daily as needed for muscle spasms. Patient not taking: Reported on 10/26/2014 04/27/14   Jackolyn Confer, MD  HYDROcodone-acetaminophen (NORCO/VICODIN) 5-325 MG per tablet Take 1 tablet by mouth every 6 (six) hours as needed for moderate pain. Patient not taking: Reported on 10/26/2014 04/15/14   Jackolyn Confer, MD    Allergies as of 09/17/2014 - Review Complete 08/11/2014  Allergen Reaction Noted  . Latex  11/18/2009  . Lisinopril  06/22/2011  . Shrimp [shellfish allergy] Swelling 06/22/2011    Family History  Problem Relation Age of Onset  . Cancer Sister     breast cancer  . Breast cancer Sister   . Hyperlipidemia    . Hypertension Mother   . Alzheimer's disease Mother   . Dementia Mother   . Hypertension Maternal Grandfather   . Hyperlipidemia Maternal Grandfather   . Birth defects Maternal Grandmother     Bladder  . Colon cancer Maternal Grandmother     History   Social History  . Marital Status: Widowed    Spouse Name: N/A  . Number of  Children: 2  . Years of Education: N/A   Occupational History  . [employer]     Full Time  .     Social History Main Topics  . Smoking status: Never Smoker   . Smokeless tobacco: Never Used  . Alcohol Use: Yes     Comment: Occasional  . Drug Use: No  . Sexual Activity: Not on file   Other Topics Concern  . Not on file   Social History Narrative   Lives with mother, who requires full care in [place]. Works for [employer].       Regular Exercise -  NO   Daily Caffeine Use:  1 soda/tea              Review of Systems: See HPI, otherwise negative ROS  Physical Exam: BP 151/73 mmHg  Pulse 72  Temp(Src) 98.4 F (36.9 C) (Tympanic)  Resp 18  Ht 5' 2.5" (1.588 m)  Wt 107.049 kg (236 lb)  BMI 42.45 kg/m2  SpO2 100% General:   Alert,  pleasant and  cooperative in NAD Head:  Normocephalic and atraumatic. Neck:  Supple; no masses or thyromegaly. Lungs:  Clear throughout to auscultation.    Heart:  Regular rate and rhythm. Abdomen:  Soft, nontender and nondistended. Normal bowel sounds, without guarding, and without rebound.   Neurologic:  Alert and  oriented x4;  grossly normal neurologically.  Impression/Plan: Terri Werner is here for an colonoscopy to be performed for personal history of colon polyps  Risks, benefits, limitations, and alternatives regarding  colonoscopy have been reviewed with the patient.  Questions have been answered.  All parties agreeable.   Gaylyn Cheers, MD  11/01/2014, 10:16 AM   Primary Care Physician:  Rica Mast, MD Primary Gastroenterologist:  Dr. Vira Agar  Pre-Procedure History & Physical: HPI:  Terri Werner is a 64 y.o. female is here for an colonoscopy.   Past Medical History  Diagnosis Date  . Heart murmur   . Hypertension   . Arthritis     pt reports in back & knees  . GERD (gastroesophageal reflux disease)   . History of chicken pox   . Hx of migraine headaches   . History of blood transfusion     during each major surgery per pt  . Genital warts   . Hepatitis B   . HTN (hypertension)   . Colon polyps 2010    At  [facility]  . History of torn meniscus of right knee   . Hypopotassemia   . History of hiatal hernia     Past Surgical History  Procedure Laterality Date  . Tonsillectomy    . Vesicovaginal fistula closure w/ tah    . Cesarean section    . Laproscopic left knee repair  1971  . Ectopic pregnancy surgery    . Total abdominal hysterectomy w/ bilateral salpingoophorectomy  2006  . Knee arthroscopy    . Appendectomy  1980  . Abdominal hysterectomy    . Hernia repair      Prior to Admission medications   Medication Sig Start Date End Date Taking? Authorizing Provider  amLODipine (NORVASC) 10 MG tablet Take 1 tablet (10 mg total)  by mouth daily. 09/30/13  Yes Minna Merritts, MD  aspirin 81 MG tablet Take 81 mg by mouth daily.   Yes Historical Provider, MD  Cholecalciferol (VITAMIN D) 2000 UNITS CAPS Take 2,000 Units by mouth daily.   Yes Historical Provider, MD  Magnesium 250 MG TABS Take 250 mg by mouth daily.   Yes Historical Provider, MD  meloxicam (MOBIC) 15 MG tablet Take 1 tablet (15 mg total) by mouth daily as needed. Patient taking differently: Take 15 mg by mouth daily as needed for pain.  07/05/14  Yes Jackolyn Confer, MD  nystatin cream (MYCOSTATIN) Apply 1 application topically 2 (two) times daily.   Yes Historical Provider, MD  pravastatin (PRAVACHOL) 20 MG tablet take 1 tablet by mouth once daily at bedtime 04/05/14  Yes Minna Merritts, MD  cyclobenzaprine (FLEXERIL) 5 MG tablet Take 1 tablet (5 mg total) by mouth 3 (three) times daily as needed for muscle spasms. Patient not taking: Reported on 10/26/2014 04/27/14   Jackolyn Confer, MD  HYDROcodone-acetaminophen (NORCO/VICODIN) 5-325 MG per tablet Take 1 tablet by mouth every 6 (six) hours as needed for moderate pain. Patient not taking: Reported on 10/26/2014 04/15/14   Jackolyn Confer, MD    Allergies as of 09/17/2014 - Review Complete 08/11/2014  Allergen Reaction Noted  . Latex  11/18/2009  . Lisinopril  06/22/2011  . Shrimp [shellfish allergy] Swelling 06/22/2011    Family History  Problem Relation Age of Onset  . Cancer Sister     breast cancer  . Breast cancer Sister   . Hyperlipidemia    . Hypertension Mother   . Alzheimer's disease Mother   . Dementia Mother   . Hypertension Maternal Grandfather   . Hyperlipidemia Maternal Grandfather   . Birth defects Maternal Grandmother     Bladder  . Colon cancer Maternal Grandmother     History   Social History  . Marital Status: Widowed    Spouse Name: N/A  . Number of Children: 2  . Years of Education: N/A   Occupational History  . [employer]     Full  Time  .     Social History Main Topics  . Smoking status: Never Smoker   . Smokeless tobacco: Never Used  . Alcohol Use: Yes     Comment: Occasional  . Drug Use: No  . Sexual Activity: Not on file   Other Topics Concern  . Not on file   Social History Narrative   Lives with mother, who requires full care in [place]. Works for [employer].       Regular Exercise -  NO   Daily Caffeine Use:  1 soda/tea              Review of Systems: See HPI, otherwise negative ROS  Physical Exam: BP 151/73 mmHg  Pulse 72  Temp(Src) 98.4 F (36.9 C) (Tympanic)  Resp 18  Ht 5' 2.5" (1.588 m)  Wt 107.049 kg (236 lb)  BMI 42.45 kg/m2  SpO2 100% General:   Alert,  pleasant and cooperative in NAD Head:  Normocephalic and atraumatic. Neck:  Supple; no masses or thyromegaly. Lungs:  Clear throughout to auscultation.    Heart:  Regular rate and rhythm. Abdomen:  Soft, nontender and nondistended. Normal bowel sounds, without guarding, and without rebound.   Neurologic:  Alert and  oriented x4;  grossly normal neurologically.  Impression/Plan: Terri Werner is here for an colonoscopy to be performed for personal history of colon polyps  Risks, benefits, limitations, and alternatives regarding  colonoscopy have been reviewed with the patient.  Questions have been answered.  All parties agreeable.   Gaylyn Cheers, MD  11/01/2014, 10:16 AM

## 2014-11-01 NOTE — Anesthesia Preprocedure Evaluation (Signed)
Anesthesia Evaluation  Patient identified by MRN, date of birth, ID band Patient awake    Reviewed: Allergy & Precautions, NPO status , Patient's Chart, lab work & pertinent test results  History of Anesthesia Complications Negative for: history of anesthetic complications  Airway Mallampati: III       Dental  (+) Teeth Intact   Pulmonary neg pulmonary ROS,    Pulmonary exam normal       Cardiovascular hypertension, Pt. on medications + Peripheral Vascular Disease Normal cardiovascular exam    Neuro/Psych negative neurological ROS  negative psych ROS   GI/Hepatic hiatal hernia, GERD-  ,(+) Hepatitis -, B  Endo/Other  negative endocrine ROS  Renal/GU negative Renal ROS  negative genitourinary   Musculoskeletal  (+) Arthritis -, Osteoarthritis,    Abdominal (+) + obese,   Peds negative pediatric ROS (+)  Hematology negative hematology ROS (+)   Anesthesia Other Findings   Reproductive/Obstetrics negative OB ROS                             Anesthesia Physical Anesthesia Plan  ASA: III  Anesthesia Plan: General   Post-op Pain Management:    Induction: Intravenous  Airway Management Planned: Nasal Cannula  Additional Equipment:   Intra-op Plan:   Post-operative Plan:   Informed Consent: I have reviewed the patients History and Physical, chart, labs and discussed the procedure including the risks, benefits and alternatives for the proposed anesthesia with the patient or authorized representative who has indicated his/her understanding and acceptance.     Plan Discussed with: CRNA  Anesthesia Plan Comments:         Anesthesia Quick Evaluation

## 2014-11-01 NOTE — Anesthesia Postprocedure Evaluation (Signed)
  Anesthesia Post-op Note  Patient: Terri Werner  Procedure(s) Performed: Procedure(s): COLONOSCOPY WITH PROPOFOL (N/A)  Anesthesia type:General  Patient location: PACU  Post pain: Pain level controlled  Post assessment: Post-op Vital signs reviewed, Patient's Cardiovascular Status Stable, Respiratory Function Stable, Patent Airway and No signs of Nausea or vomiting  Post vital signs: Reviewed and stable  Last Vitals:  Filed Vitals:   11/01/14 1104  BP: 111/70  Pulse: 74  Temp: 35.9 C  Resp: 17    Level of consciousness: awake, alert  and patient cooperative  Complications: No apparent anesthesia complications

## 2014-11-02 LAB — SURGICAL PATHOLOGY

## 2014-11-02 NOTE — Op Note (Addendum)
[facility] Patient Name: Terri Werner [facility] Procedure Date: 11/01/2014 10:29 AM MRN: [id number] Account #: [id number] Date of Birth: May 19, 1950 Admit Type: Outpatient Age: 64 Room: [facility] ENDO ROOM 1 Gender: Female Note Status: Finalized Procedure:         Colonoscopy Indications:       Follow-up for history of adenomatous polyps in the colon Providers:         Scot Jun, MD Referring MD:      Ginette Pitman. Dan Humphreys, MD (Referring MD) Medicines:         Propofol per Anesthesia Complications:     No immediate complications. Procedure:         Pre-Anesthesia Assessment:                    - After reviewing the risks and benefits, the patient was                     deemed in satisfactory condition to undergo the procedure.                    After obtaining informed consent, the colonoscope was                     passed under direct vision. Throughout the procedure, the                     patient's blood pressure, pulse, and oxygen saturations                     were monitored continuously. The Olympus PCF-160AL                     colonoscope (S#. [id number]) was introduced through the anus                     and advanced to the the cecum, identified by appendiceal                     orifice and ileocecal valve. The colonoscopy was somewhat                     difficult due to a tortuous colon. The patient tolerated                     the procedure well. The quality of the bowel preparation                     was excellent. Findings:      Two sessile polyps were found in the sigmoid colon. The polyps were       diminutive in size. These polyps were removed with a jumbo cold forceps.       Resection and retrieval were complete.      Many small-mouthed diverticula were found in the sigmoid colon.      Internal hemorrhoids were found during endoscopy. The hemorrhoids were       small and Grade I (internal hemorrhoids that do not  prolapse). Impression:        - Two diminutive polyps in the sigmoid colon. Resected and                     retrieved.                    -  Diverticulosis in the sigmoid colon.                    - Internal hemorrhoids. Recommendation:    - Await pathology results. Scot Jun, MD 11/01/2014 11:55:23 AM This report has been signed electronically. Number of Addenda: 0 Note Initiated On: 11/01/2014 10:29 AM Scope Withdrawal Time: 0 hours 14 minutes 54 seconds  Total Procedure Duration: 0 hours 28 minutes 32 seconds       [facility]

## 2014-11-08 ENCOUNTER — Encounter: Payer: Self-pay | Admitting: Unknown Physician Specialty

## 2014-11-25 ENCOUNTER — Ambulatory Visit (INDEPENDENT_AMBULATORY_CARE_PROVIDER_SITE_OTHER): Payer: Managed Care, Other (non HMO) | Admitting: Family Medicine

## 2014-11-25 ENCOUNTER — Other Ambulatory Visit (INDEPENDENT_AMBULATORY_CARE_PROVIDER_SITE_OTHER): Payer: Managed Care, Other (non HMO)

## 2014-11-25 ENCOUNTER — Other Ambulatory Visit: Payer: Self-pay | Admitting: Family Medicine

## 2014-11-25 ENCOUNTER — Encounter: Payer: Self-pay | Admitting: Family Medicine

## 2014-11-25 ENCOUNTER — Ambulatory Visit (INDEPENDENT_AMBULATORY_CARE_PROVIDER_SITE_OTHER)
Admission: RE | Admit: 2014-11-25 | Discharge: 2014-11-25 | Disposition: A | Discharge status: Home / Self Care | Payer: Managed Care, Other (non HMO) | Source: Ambulatory Visit | Attending: Family Medicine | Admitting: Family Medicine

## 2014-11-25 VITALS — BP 132/84 | HR 77 | Ht 62.5 in | Wt 246.0 lb

## 2014-11-25 DIAGNOSIS — M129 Arthropathy, unspecified: Secondary | ICD-10-CM | POA: Diagnosis not present

## 2014-11-25 DIAGNOSIS — M25561 Pain in right knee: Secondary | ICD-10-CM

## 2014-11-25 DIAGNOSIS — IMO0002 Reserved for concepts with insufficient information to code with codable children: Secondary | ICD-10-CM

## 2014-11-25 NOTE — Progress Notes (Signed)
Pre visit review using our clinic review tool, if applicable. No additional management support is needed unless otherwise documented below in the visit note. 

## 2014-11-25 NOTE — Progress Notes (Signed)
[facility] [address], [place] [address] Phone: [phone] Subjective:     CC: Knee pain  [id number] Terri Werner Terri Werner is a 64 y.o. female coming in with complaint of left knee pain. Patient has a past medical history of having surgery on this knee.  Patient does not remember any true injury but was doing some more activity. Patient states that this is been going on for approximately 2 weeks but seems to be worsening. More pain on the medial aspect of the knee. States that it does have some mild instability feeling. Denies any radiation down the leg but states that even walking short distances seems to be painful. Patient rates the severity of pain a 7 out of 10. Patient states that the pain is even kept her up at night.     Past medical history, social, surgical and family history all reviewed in electronic medical record.   Review of Systems: No headache, visual changes, nausea, vomiting, diarrhea, constipation, dizziness, abdominal pain, skin rash, fevers, chills, night sweats, weight loss, swollen lymph nodes, body aches, joint swelling, muscle aches, chest pain, shortness of breath, mood changes.   Objective Blood pressure 132/84, pulse 77, height 5' 2.5" (1.588 m), weight 246 lb (111.585 kg), SpO2 98 %.  General: No apparent distress alert and oriented x3 mood and affect normal, dressed appropriately.  HEENT: Pupils equal, extraocular movements intact  Respiratory: Patient's speak in full sentences and does not appear short of breath  Cardiovascular: No lower extremity edema, non tender, no erythema  Skin: Warm dry intact with no signs of infection or rash on extremities or on axial skeleton.  Abdomen: Soft nontender  Neuro: Cranial nerves II through XII are intact, neurovascularly intact in all extremities with 2+ DTRs and 2+ pulses.  Lymph: No lymphadenopathy of posterior or anterior cervical chain or axillae bilaterally.    Gait normal with good balance and coordination.  MSK:  Non tender with full range of motion and good stability and symmetric strength and tone of shoulders, elbows, wrist, hip, and ankles bilaterally.  Knee: Left Normal to inspection with no erythema or effusion or obvious bony abnormalities. Severe tenderness over the medial joint line Lacks last 5 of flexion Ligaments with solid consistent endpoints including ACL, PCL, LCL, MCL. Negative Mcmurray's, Apley's, and Thessalonian tests. Non painful patellar compression. Patellar glide with mild to moderate crepitus. Patellar and quadriceps tendons unremarkable. Hamstring and quadriceps strength is normal.  Contralateral knee  MSK US performed of: Left knee This study was ordered, performed, and interpreted by Charlann Boxer D.O.  Knee: All structures visualized. Trace effusion of the suprapatellar pouch Severe narrowing on the medial joint line with near bone-on-bone arthritis, lateral compartment mild narrowing. No meniscal injury noted on the lateral side. Unable to visualize medial meniscus likely postsurgical. Patellar Tendon unremarkable on long and transverse views without effusion. No abnormality of prepatellar bursa. LCL and MCL unremarkable on long and transverse views. No abnormality of origin of medial or lateral head of the gastrocnemius.  IMPRESSION:  Severe medial compartment arthritis  Procedure: Real-time Ultrasound Guided Injection of left knee Device: GE Logiq E  Ultrasound guided injection is preferred based studies that show increased duration, increased effect, greater accuracy, decreased procedural pain, increased response rate, and decreased cost with ultrasound guided versus blind injection.  Verbal informed consent obtained.  Time-out conducted.  Noted no overlying erythema, induration, or other signs of local infection.  Skin prepped in a sterile  fashion.  Local anesthesia: Topical Ethyl chloride.  With  sterile technique and under real time ultrasound guidance: With a 22-gauge 2 inch needle patient was injected with 4 cc of 0.5% Marcaine and 1 cc of Kenalog 40 mg/dL. This was from a superior lateral approach.  Completed without difficulty  Pain immediately resolved suggesting accurate placement of the medication.  Advised to call if fevers/chills, erythema, induration, drainage, or persistent bleeding.  Images permanently stored and available for review in the ultrasound unit.  Impression: Technically successful ultrasound guided injection.   Impression and Recommendations:     This case required medical decision making of moderate complexity.

## 2014-11-25 NOTE — Patient Instructions (Signed)
Good to see you For the knee wear brace daily with activity Try the pennsaid twice daily in the area Ice 20 minutes 2 times daily. Usually after activity and before bed. See me again in 3-4 weeks we may need ot start other injections Xray downstairs today

## 2014-11-26 ENCOUNTER — Encounter: Payer: Self-pay | Admitting: Family Medicine

## 2014-11-26 ENCOUNTER — Telehealth: Payer: Self-pay | Admitting: Family Medicine

## 2014-11-26 DIAGNOSIS — IMO0002 Reserved for concepts with insufficient information to code with codable children: Secondary | ICD-10-CM | POA: Insufficient documentation

## 2014-11-26 NOTE — Assessment & Plan Note (Signed)
Patient was given an injection today and tolerated the procedure well. With what is found on the ultrasound and thinking patient will have severe arthritis. X-rays ordered today for further evaluation. I do think a custom brace will be necessary for this individual. Patient wants to avoid any other type of surgical intervention at this time. Patient has done many different home exercises previously but did have a refresher course with athletic trainer today. Patient will come back again in 3 weeks for further evaluation. Patient does have topical anti-inflammatory's for pain relief. Continuing to have pain in 3 weeks' patient could be a candidate for viscous supplementation.

## 2014-11-26 NOTE — Telephone Encounter (Signed)
Patient states in chart it states her right knee is what she is having issue with.  Patient states it should read her left knee.  Is is asking this to be corrected.

## 2014-11-26 NOTE — Telephone Encounter (Signed)
It's been fixed.

## 2014-11-30 ENCOUNTER — Encounter: Payer: Self-pay | Admitting: Internal Medicine

## 2014-12-27 ENCOUNTER — Encounter: Payer: Self-pay | Admitting: Family Medicine

## 2014-12-27 ENCOUNTER — Ambulatory Visit (INDEPENDENT_AMBULATORY_CARE_PROVIDER_SITE_OTHER): Payer: Managed Care, Other (non HMO) | Admitting: Family Medicine

## 2014-12-27 VITALS — BP 128/78 | HR 66 | Ht 62.5 in | Wt 242.0 lb

## 2014-12-27 DIAGNOSIS — M129 Arthropathy, unspecified: Secondary | ICD-10-CM | POA: Diagnosis not present

## 2014-12-27 DIAGNOSIS — IMO0002 Reserved for concepts with insufficient information to code with codable children: Secondary | ICD-10-CM

## 2014-12-27 NOTE — Progress Notes (Signed)
Pre visit review using our clinic review tool, if applicable. No additional management support is needed unless otherwise documented below in the visit note. 

## 2014-12-27 NOTE — Patient Instructions (Signed)
Good to see you Ice 20 minutes 2 times daily. Usually after activity and before bed. Exercises 3 times a week.  Keep working your tail off.  I think the brace will help You are so fun! See me when you need me.

## 2014-12-27 NOTE — Progress Notes (Signed)
  [facility] [address] [place], [place] [address] Phone: [phone] Subjective:     CC: Knee pain left follow-up  IAX:KPVVZSMOLM Terri Werner is a 64 y.o. female coming in with complaint of left knee pain. Patient has a past medical history of having surgery on this knee. Patient does have severe arthritis of this left knee. Patient was given a steroid injection one month ago. Patient was to do bracing, home exercise, icing and topical anti-inflammatory's. Patient states she is 90% better. Denies any numbness or tingling. Patient states that she's been able to do all activities. Patient went to [place] and had to walk around and did very well. States that she can do all activities without any trouble.     Past medical history, social, surgical and family history all reviewed in electronic medical record.   Review of Systems: No headache, visual changes, nausea, vomiting, diarrhea, constipation, dizziness, abdominal pain, skin rash, fevers, chills, night sweats, weight loss, swollen lymph nodes, body aches, joint swelling, muscle aches, chest pain, shortness of breath, mood changes.   Objective Blood pressure 128/78, pulse 66, height 5' 2.5" (1.588 m), weight 242 lb (109.77 kg), SpO2 99 %.  General: No apparent distress alert and oriented x3 mood and affect normal, dressed appropriately.  HEENT: Pupils equal, extraocular movements intact  Respiratory: Patient's speak in full sentences and does not appear short of breath  Cardiovascular: No lower extremity edema, non tender, no erythema  Skin: Warm dry intact with no signs of infection or rash on extremities or on axial skeleton.  Abdomen: Soft nontender  Neuro: Cranial nerves II through XII are intact, neurovascularly intact in all extremities with 2+ DTRs and 2+ pulses.  Lymph: No lymphadenopathy of posterior or anterior cervical chain or axillae bilaterally.  Gait normal with good  balance and coordination.  MSK:  Non tender with full range of motion and good stability and symmetric strength and tone of shoulders, elbows, wrist, hip, and ankles bilaterally.  Knee: Left Normal to inspection with no erythema or effusion or obvious bony abnormalities. Mild tenderness over the medial joint line Near full range of motion Ligaments with solid consistent endpoints including ACL, PCL, LCL, MCL. Negative Mcmurray's, Apley's, and Thessalonian tests. Non painful patellar compression. Patellar glide with mild to moderate crepitus. Patellar and quadriceps tendons unremarkable. Hamstring and quadriceps strength is normal.  Contralateral knee unremarkable    Impression and Recommendations:     This case required medical decision making of moderate complexity.

## 2014-12-27 NOTE — Assessment & Plan Note (Signed)
Doing significant better after the injection 1 month ago. Encourage her to continue the same conservative therapy. Patient can have repeat injection every 3-4 months if necessary and we have discussed viscous supplementation. Patient follow-up in 2 months for further evaluation.

## 2015-05-03 ENCOUNTER — Encounter: Payer: Self-pay | Admitting: Internal Medicine

## 2015-05-03 ENCOUNTER — Other Ambulatory Visit: Payer: Self-pay

## 2015-05-03 ENCOUNTER — Other Ambulatory Visit: Payer: Self-pay | Admitting: Cardiovascular Disease

## 2015-05-03 MED ORDER — AMLODIPINE BESYLATE 10 MG PO TABS: 10.0000 mg | ORAL_TABLET | Freq: Every day | ORAL | Status: DC

## 2015-05-03 NOTE — Telephone Encounter (Signed)
Please advise if can be refilled, was originally getting from the [employer] blood pressure study program at [facility], last Office Visit was on 07/05/14 with you.  Please advise?

## 2015-07-04 ENCOUNTER — Telehealth: Payer: Self-pay | Admitting: Internal Medicine

## 2015-07-04 NOTE — Telephone Encounter (Signed)
Good morning! Pt sent a Mychart message to sch for  Hepatitis C Screening    Tetanus/Tdap   Zostavax   I wanted to check if pt is due for any of these? Let me know. Thank you!

## 2015-07-04 NOTE — Telephone Encounter (Signed)
She is due for all three

## 2015-07-04 NOTE — Telephone Encounter (Signed)
Thank you :)

## 2015-07-06 ENCOUNTER — Ambulatory Visit (INDEPENDENT_AMBULATORY_CARE_PROVIDER_SITE_OTHER): Payer: Managed Care, Other (non HMO)

## 2015-07-06 DIAGNOSIS — Z23 Encounter for immunization: Secondary | ICD-10-CM | POA: Diagnosis not present

## 2015-07-06 DIAGNOSIS — Z0000 Encounter for general adult medical examination without abnormal findings: Secondary | ICD-10-CM

## 2015-07-06 MED ORDER — ZOSTER VACCINE LIVE 19400 UNT/0.65ML ~~LOC~~ SOLR
0.6500 mL | Freq: Once | SUBCUTANEOUS | Status: AC
  Administered 2015-07-06: 19400 [IU] via SUBCUTANEOUS

## 2015-07-06 NOTE — Progress Notes (Signed)
Patient came in for Tdap and shingles vaccines.  Received both vaccines, see immunizations for details.   Patient also need Hepatits C screening sent to lab to have blood drawn.

## 2015-07-07 LAB — HEPATITIS C ANTIBODY: HCV Ab: NEGATIVE

## 2015-08-04 ENCOUNTER — Encounter: Payer: Self-pay | Admitting: Internal Medicine

## 2015-08-04 ENCOUNTER — Ambulatory Visit
Admission: RE | Admit: 2015-08-04 | Discharge: 2015-08-04 | Disposition: A | Discharge status: Home / Self Care | Payer: Managed Care, Other (non HMO) | Source: Ambulatory Visit | Attending: Internal Medicine | Admitting: Internal Medicine

## 2015-08-04 ENCOUNTER — Ambulatory Visit (INDEPENDENT_AMBULATORY_CARE_PROVIDER_SITE_OTHER): Payer: Managed Care, Other (non HMO) | Admitting: Internal Medicine

## 2015-08-04 VITALS — BP 139/75 | HR 68 | Temp 97.8°F | Ht 63.0 in | Wt 252.1 lb

## 2015-08-04 DIAGNOSIS — M545 Low back pain, unspecified: Secondary | ICD-10-CM

## 2015-08-04 DIAGNOSIS — M47896 Other spondylosis, lumbar region: Secondary | ICD-10-CM | POA: Insufficient documentation

## 2015-08-04 MED ORDER — HYDROCODONE-ACETAMINOPHEN 5-325 MG PO TABS: 1.0000 | ORAL_TABLET | Freq: Three times a day (TID) | ORAL | Status: DC | PRN

## 2015-08-04 MED ORDER — CYCLOBENZAPRINE HCL 5 MG PO TABS: 5.0000 mg | ORAL_TABLET | Freq: Three times a day (TID) | ORAL | Status: DC | PRN

## 2015-08-04 MED ORDER — MELOXICAM 15 MG PO TABS: 15.0000 mg | ORAL_TABLET | Freq: Every day | ORAL | Status: DC | PRN

## 2015-08-04 NOTE — Assessment & Plan Note (Signed)
Symptoms of acute on chronic low back pain. Exam remarkable for tenderness over mid lumbar spine. Will get plain xray. Set up PT. Continue Meloxicam. Add prn Flexeril and Hydrocodone for severe pain. Discussed potential risks of these medications.

## 2015-08-04 NOTE — Progress Notes (Signed)
Pre visit review using our clinic review tool, if applicable. No additional management support is needed unless otherwise documented below in the visit note. 

## 2015-08-04 NOTE — Patient Instructions (Signed)
Xray of lower back today at [facility].  Continue Meloxicam 15mg  daily.  Add Cyclobenzaprine 5mg  up to three times daily for pain.   Add Hydrocodone as needed at night for severe pain.  We will set up physical therapy.  Follow up 2-4 weeks.

## 2015-08-04 NOTE — Progress Notes (Signed)
Subjective:    Patient ID: Terri Werner, female    DOB: [birth year], 65 y.o.   MRN: [id number]  HPI  65YO female presents for acute visit.  Back pain - Lower back. Most prominent on right side, but bilateral. Severe over last 1-2 months. Tried Accupuncture with no improvement. Also takes Ibuprofen with no improvement. No known injury, however did do some heavy lifting when her mother was ill. Has diagnosis of scoliosis as a child. Sometimes feels a "crunchy" sound in back. No weakness in legs. No numbness. Pain is severe at times and prevents her from sleeping.   Wt Readings from Last 3 Encounters:  08/04/15 252 lb 2 oz (114.363 kg)  12/27/14 242 lb (109.77 kg)  11/25/14 246 lb (111.585 kg)   BP Readings from Last 3 Encounters:  08/04/15 139/75  12/27/14 128/78  11/25/14 132/84    Past Medical History  Diagnosis Date  . Heart murmur   . Hypertension   . Arthritis     pt reports in back & knees  . GERD (gastroesophageal reflux disease)   . History of chicken pox   . Hx of migraine headaches   . History of blood transfusion     during each major surgery per pt  . Genital warts   . Hepatitis B   . HTN (hypertension)   . Colon polyps 2010    At [facility]  . History of torn meniscus of right knee   . Hypopotassemia   . History of hiatal hernia    Family History  Problem Relation Age of Onset  . Cancer Sister     breast cancer  . Breast cancer Sister   . Hyperlipidemia    . Hypertension Mother   . Alzheimer's disease Mother   . Dementia Mother   . Hypertension Maternal Grandfather   . Hyperlipidemia Maternal Grandfather   . Birth defects Maternal Grandmother     Bladder  . Colon cancer Maternal Grandmother    Past Surgical History  Procedure Laterality Date  . Tonsillectomy    . Vesicovaginal fistula closure w/ tah    . Cesarean section    . Laproscopic left knee repair  1971  . Ectopic pregnancy surgery    . Total abdominal hysterectomy w/  bilateral salpingoophorectomy  2006  . Knee arthroscopy    . Appendectomy  1980  . Abdominal hysterectomy    . Hernia repair    . Colonoscopy with propofol N/A 11/01/2014    Procedure: COLONOSCOPY WITH PROPOFOL;  Surgeon: Scot Jun, MD;  Location: [facility] ENDOSCOPY;  Service: Endoscopy;  Laterality: N/A;   Social History   Social History  . Marital Status: Widowed    Spouse Name: N/A  . Number of Children: 2  . Years of Education: N/A   Occupational History  . [employer]     Full Time  .     Social History Main Topics  . Smoking status: Never Smoker   . Smokeless tobacco: Never Used  . Alcohol Use: Yes     Comment: Occasional  . Drug Use: No  . Sexual Activity: Not Asked   Other Topics Concern  . None   Social History Narrative   Lives with mother, who requires full care in [place]. Works for [employer].       Regular Exercise -  NO   Daily Caffeine Use:  1 soda/tea  Review of Systems  Constitutional: Negative for fever, chills, appetite change, fatigue and unexpected weight change.  Eyes: Negative for visual disturbance.  Respiratory: Negative for shortness of breath.   Cardiovascular: Negative for chest pain and leg swelling.  Gastrointestinal: Negative for abdominal pain.  Musculoskeletal: Positive for back pain and arthralgias. Negative for joint swelling and gait problem.  Skin: Negative for color change and rash.  Neurological: Negative for weakness and numbness.  Hematological: Negative for adenopathy. Does not bruise/bleed easily.  Psychiatric/Behavioral: Negative for sleep disturbance and dysphoric mood. The patient is not nervous/anxious.        Objective:    BP 139/75 mmHg  Pulse 68  Temp(Src) 97.8 F (36.6 C) (Oral)  Ht 5\' 3"  (1.6 m)  Wt 252 lb 2 oz (114.363 kg)  BMI 44.67 kg/m2  SpO2 98% Physical Exam  Constitutional: She is oriented to person, place, and time. She appears well-developed and  well-nourished. No distress.  HENT:  Head: Normocephalic and atraumatic.  Right Ear: External ear normal.  Left Ear: External ear normal.  Nose: Nose normal.  Mouth/Throat: Oropharynx is clear and moist.  Eyes: Conjunctivae are normal. Pupils are equal, round, and reactive to light. Right eye exhibits no discharge. Left eye exhibits no discharge. No scleral icterus.  Neck: Normal range of motion. Neck supple. No tracheal deviation present. No thyromegaly present.  Cardiovascular: Normal rate, regular rhythm, normal heart sounds and intact distal pulses.  Exam reveals no gallop and no friction rub.   No murmur heard. Pulmonary/Chest: Effort normal and breath sounds normal. No respiratory distress. She has no wheezes. She has no rales. She exhibits no tenderness.  Musculoskeletal: Normal range of motion. She exhibits no edema.       Lumbar back: She exhibits tenderness (mid lumbar spine), bony tenderness and pain. She exhibits normal range of motion, no edema, no deformity and no spasm.  Lymphadenopathy:    She has no cervical adenopathy.  Neurological: She is alert and oriented to person, place, and time. No cranial nerve deficit. She exhibits normal muscle tone. Coordination normal.  Skin: Skin is warm and dry. No rash noted. She is not diaphoretic. No erythema. No pallor.  Psychiatric: She has a normal mood and affect. Her behavior is normal. Judgment and thought content normal.          Assessment & Plan:   Problem List Items Addressed This Visit    None       No Follow-up on file.  Ronna Polio, MD Internal Medicine [facility]

## 2015-08-16 ENCOUNTER — Ambulatory Visit: Payer: Managed Care, Other (non HMO) | Attending: Internal Medicine | Admitting: Physical Therapy

## 2015-08-16 DIAGNOSIS — M545 Low back pain, unspecified: Secondary | ICD-10-CM

## 2015-08-16 DIAGNOSIS — G8929 Other chronic pain: Secondary | ICD-10-CM | POA: Diagnosis present

## 2015-08-16 DIAGNOSIS — R262 Difficulty in walking, not elsewhere classified: Secondary | ICD-10-CM | POA: Diagnosis present

## 2015-08-16 NOTE — Patient Instructions (Signed)
All exercises provided were adapted from [email or web address]. Patient was provided a written handout with pictures as described. Any additional cues were manually entered in to handout and copied in to this document.  SEATED PIRIFORMIS STRETCH  While sitting in a chair, cross your leg with the ankle of one foot on the knee of the other.  Next, pull the top knee upward towards your opposite shoulder for a stretch.   Glute Release with Tennis [employer] the tennis ball on the wall and maintain pressure with your back to the wall. Use your knees to bend up and down so the ball rolls over the area that is sore.   Standing Lumbar Extension  Standing with upright posture, place hands on low back and extend/ lean back. Hold this position for 5 seconds and return to starting position. Repeat as directed.

## 2015-08-17 NOTE — Therapy (Signed)
[facility] [address], [place], [address] Phone: [phone]   Fax:  [phone]  Physical Therapy Evaluation  Patient Details  Name: Terri Werner MRN: [id number] Date of Birth: January 24, 1951 No Data Recorded  Encounter Date: 08/16/2015      PT End of Session - 08/16/15 1630    Visit Number 1   Number of Visits 13   Date for PT Re-Evaluation 10/04/15   PT Start Time [id number]   PT Stop Time 1620   PT Time Calculation (min) 65 min   Activity Tolerance Patient tolerated treatment well;No increased pain   Behavior During Therapy [facility] for tasks assessed/performed      Past Medical History  Diagnosis Date  . Heart murmur   . Hypertension   . Arthritis     pt reports in back & knees  . GERD (gastroesophageal reflux disease)   . History of chicken pox   . Hx of migraine headaches   . History of blood transfusion     during each major surgery per pt  . Genital warts   . Hepatitis B   . HTN (hypertension)   . Colon polyps 2010    At [facility]  . History of torn meniscus of right knee   . Hypopotassemia   . History of hiatal hernia     Past Surgical History  Procedure Laterality Date  . Tonsillectomy    . Vesicovaginal fistula closure w/ tah    . Cesarean section    . Laproscopic left knee repair  1971  . Ectopic pregnancy surgery    . Total abdominal hysterectomy w/ bilateral salpingoophorectomy  2006  . Knee arthroscopy    . Appendectomy  1980  . Abdominal hysterectomy    . Hernia repair    . Colonoscopy with propofol N/A 11/01/2014    Procedure: COLONOSCOPY WITH PROPOFOL;  Surgeon: Manya Silvas, MD;  Location: [facility] ENDOSCOPY;  Service: Endoscopy;  Laterality: N/A;    There were no vitals filed for this visit.  Visit Diagnosis:  Chronic right-sided low back pain without sciatica - Plan: PT plan of care cert/re-cert  Difficulty walking - Plan: PT plan of care cert/re-cert       Subjective Assessment - 08/16/15 1644    Subjective Patient reports she has had roughly 3 years of lower back pain, she is unable to provide any MOI. She reports she did a lot of lifting while her mother was in her final years with dementia. Patient reports she has the most pain now with sitting/driving    Limitations Sitting;Lifting;Standing;Walking   How long can you sit comfortably? Varies by day, has been getting progressively more painful.    How long can you stand comfortably? Less than 1 hour.    Diagnostic tests X-ray "anterolisthesis at L4-L5"    Patient Stated Goals To play with her grandchildern more.    Currently in Pain? Yes   Pain Score 10-Worst pain ever   Pain Location Back   Pain Orientation Right;Lower   Pain Descriptors / Indicators Aching;Stabbing   Pain Type Chronic pain   Pain Onset More than a month ago   Pain Frequency Constant   Aggravating Factors  Sitting, prolonged physical activity, bending forwards.    Pain Relieving Factors Unable to provide any.             [facility] PT Assessment - 08/16/15 1648    Assessment   Medical Diagnosis --  Period Weeks   Status New               Plan - 08/16/15 1630    Clinical Impression Statement Patient reports long history of lower back pain, appears multifactorial including social, occupational, and physical in origin. She demonstrates decreased R hip IR, pain in gluteal region to palpation, and preference for extension relative to flexion. She also demonstrates top down recuirtment pattern (lumbar extensors vs hip extensors) in flexion/extension motions. Patient would benefit from skilled PT services to address her pain symptoms impacting her ability to pariticpate in ADLs.    Pt will benefit from skilled therapeutic intervention in order to improve on the following deficits Abnormal gait;Difficulty walking;Decreased range of motion;Pain;Impaired flexibility;Decreased strength;Decreased activity tolerance   Rehab Potential Fair   Clinical Impairments Affecting Rehab Potential Body habitus, sitting for her occupation, chronic low back pain, psychosocial factors (stress/depression at work)   PT Frequency 2x / week   PT Duration 6 weeks   PT Treatment/Interventions Electrical Stimulation;Moist Heat;Therapeutic exercise;Therapeutic activities;Manual techniques;Balance training;Taping;Gait training;Stair training   PT Next Visit Plan Treatment of hip IR, hip flexors, bridging/NM control of lumbopelvic region, quadricep stretching   PT Home Exercise Plan See patient instructions.    Consulted and Agree with Plan of Care Patient         Problem List Patient Active Problem List   Diagnosis Date Noted  . Lumbago 08/04/2015  . Arthritis of left lower extremity 11/26/2014  . Diverticulitis of colon 07/05/2014  . Nonallopathic lesion of cervical region 06/09/2014  .  Nonallopathic lesion of thoracic region 06/09/2014  . Nonallopathic lesion of lumbar region 06/09/2014  . Rotator cuff tear 05/20/2014  . Carotid artery disorder ([place]) 04/27/2014  . Neck pain on right side 04/15/2014  . Dizziness 04/15/2014  . Hyperlipidemia 10/08/2013  . Routine general medical examination at a health care facility 09/07/2013  . Chest pain 09/07/2013  . Screening for breast cancer 06/22/2011  . Arthralgia 06/22/2011  . HYPERTENSION, BENIGN 11/19/2009   Kerman Passey, PT, DPT    08/17/2015, 8:22 PM  [facility] [address], [place], [address] Phone: [phone]   Fax:  [phone]  Name: Terri Werner MRN: [id number] Date of Birth: [birth year]  Period Weeks   Status New               Plan - 08/16/15 1630    Clinical Impression Statement Patient reports long history of lower back pain, appears multifactorial including social, occupational, and physical in origin. She demonstrates decreased R hip IR, pain in gluteal region to palpation, and preference for extension relative to flexion. She also demonstrates top down recuirtment pattern (lumbar extensors vs hip extensors) in flexion/extension motions. Patient would benefit from skilled PT services to address her pain symptoms impacting her ability to pariticpate in ADLs.    Pt will benefit from skilled therapeutic intervention in order to improve on the following deficits Abnormal gait;Difficulty walking;Decreased range of motion;Pain;Impaired flexibility;Decreased strength;Decreased activity tolerance   Rehab Potential Fair   Clinical Impairments Affecting Rehab Potential Body habitus, sitting for her occupation, chronic low back pain, psychosocial factors (stress/depression at work)   PT Frequency 2x / week   PT Duration 6 weeks   PT Treatment/Interventions Electrical Stimulation;Moist Heat;Therapeutic exercise;Therapeutic activities;Manual techniques;Balance training;Taping;Gait training;Stair training   PT Next Visit Plan Treatment of hip IR, hip flexors, bridging/NM control of lumbopelvic region, quadricep stretching   PT Home Exercise Plan See patient instructions.    Consulted and Agree with Plan of Care Patient         Problem List Patient Active Problem List   Diagnosis Date Noted  . Lumbago 08/04/2015  . Arthritis of left lower extremity 11/26/2014  . Diverticulitis of colon 07/05/2014  . Nonallopathic lesion of cervical region 06/09/2014  .  Nonallopathic lesion of thoracic region 06/09/2014  . Nonallopathic lesion of lumbar region 06/09/2014  . Rotator cuff tear 05/20/2014  . Carotid artery disorder ([place]) 04/27/2014  . Neck pain on right side 04/15/2014  . Dizziness 04/15/2014  . Hyperlipidemia 10/08/2013  . Routine general medical examination at a health care facility 09/07/2013  . Chest pain 09/07/2013  . Screening for breast cancer 06/22/2011  . Arthralgia 06/22/2011  . HYPERTENSION, BENIGN 11/19/2009   Kerman Passey, PT, DPT    08/17/2015, 8:22 PM  [facility] [address], [place], [address] Phone: [phone]   Fax:  [phone]  Name: Terri Werner MRN: [id number] Date of Birth: [birth year]

## 2015-08-18 ENCOUNTER — Ambulatory Visit: Payer: Managed Care, Other (non HMO) | Admitting: Physical Therapy

## 2015-08-18 ENCOUNTER — Encounter: Payer: Self-pay | Admitting: Internal Medicine

## 2015-08-18 ENCOUNTER — Ambulatory Visit (INDEPENDENT_AMBULATORY_CARE_PROVIDER_SITE_OTHER): Payer: Managed Care, Other (non HMO) | Admitting: Internal Medicine

## 2015-08-18 VITALS — BP 136/72 | HR 76 | Temp 98.5°F | Wt 248.0 lb

## 2015-08-18 DIAGNOSIS — G8929 Other chronic pain: Secondary | ICD-10-CM

## 2015-08-18 DIAGNOSIS — M545 Low back pain, unspecified: Secondary | ICD-10-CM

## 2015-08-18 DIAGNOSIS — R262 Difficulty in walking, not elsewhere classified: Secondary | ICD-10-CM

## 2015-08-18 NOTE — Patient Instructions (Addendum)
All exercises provided were adapted from [employer]. Patient was provided a written handout with pictures as described. Any additional cues were manually entered in to handout and copied in to this document.  Supine bridge  Lie down on your back and bend your knees so that your feet are flat on the table. Press your heels into the ground to lift your hips off of the table. You should stabilize through your core. Return to the starting position controlling your speed.    Prone pressup with rotation  Lie on your stomach with your hands under your shoulders.  Lift your chest, keeping your shoulders away from your ears and your elbows somewhat bent. First, lift straight forward, then lower. Next, lift and rotate toward one side, then lower. Finally, lift to the other side, then lower.  **1 repetition is going all 3 directions

## 2015-08-18 NOTE — Patient Instructions (Signed)
We will set up MRI lumbar spine and evaluation with Dr. Sharlet Salina.  Follow up in 4 weeks.

## 2015-08-18 NOTE — Assessment & Plan Note (Signed)
Persistent back pain. Started with PT this week. Plain xray shows DJD at L4-5. Despite Flexeril and Hydrocodone, pain continues to be severe at times. Will get MRI lumbar spine and set up evaluation with Dr. Sharlet Salina. Question if she may be candidate for ESI.

## 2015-08-18 NOTE — Therapy (Signed)
health care facility 09/07/2013  . Chest pain 09/07/2013  . Screening for breast cancer 06/22/2011  . Arthralgia 06/22/2011  . HYPERTENSION, BENIGN 11/19/2009   Kerman Passey, PT, DPT    08/18/2015, 4:47 PM  [facility] [address], [place], [address] Phone: [phone]   Fax:  [phone]  Name: Krisette Cloutier MRN: [id number] Date of Birth: 1951/04/22  [facility] [address] [place], [address] Phone: [phone]   Fax:  [phone]  Physical Therapy Treatment  Patient Details  Name: Nyilah Dasilva MRN: [id number] Date of Birth: 1951-02-16 No Data Recorded  Encounter Date: 08/18/2015      PT End of Session - 08/18/15 1624    Visit Number 2   Number of Visits 13   Date for PT Re-Evaluation 10/04/15   PT Start Time [id number]   PT Stop Time 1630   PT Time Calculation (min) 41 min   Activity Tolerance Patient tolerated treatment well;No increased pain   Behavior During Therapy [facility] for tasks assessed/performed      Past Medical History  Diagnosis Date  . Heart murmur   . Hypertension   . Arthritis     pt reports in back & knees  . GERD (gastroesophageal reflux disease)   . History of chicken pox   . Hx of migraine headaches   . History of blood transfusion     during each major surgery per pt  . Genital warts   . Hepatitis B   . HTN (hypertension)   . Colon polyps 2010    At [facility]  . History of torn meniscus of right knee   . Hypopotassemia   . History of hiatal hernia     Past Surgical History  Procedure Laterality Date  . Tonsillectomy    . Vesicovaginal fistula closure w/ tah    . Cesarean section    . Laproscopic left knee repair  1971  . Ectopic pregnancy surgery    . Total abdominal hysterectomy w/ bilateral salpingoophorectomy  2006  . Knee arthroscopy    . Appendectomy  1980  . Abdominal hysterectomy    . Hernia repair    . Colonoscopy with propofol N/A 11/01/2014    Procedure: COLONOSCOPY WITH PROPOFOL;  Surgeon: Manya Silvas, MD;  Location: [facility] ENDOSCOPY;  Service: Endoscopy;  Laterality: N/A;    There were no vitals filed for this visit.  Visit Diagnosis:  Chronic right-sided low back pain without sciatica  Difficulty walking      Subjective Assessment - 08/18/15 1551    Subjective Patient reports she  has started parking further away from the buildings she enters, she has been more aware of her posture recently as well. She has noticed a decline in her symptoms thus far, and appears to be progressing well.    Limitations Sitting;Lifting;Standing;Walking   How long can you sit comfortably? Varies by day, has been getting progressively more painful.    How long can you stand comfortably? Less than 1 hour.    Diagnostic tests X-ray "anterolisthesis at L4-L5"    Patient Stated Goals To play with her grandchildern more.    Currently in Pain? Yes   Pain Score 7    Pain Location Back   Aggravating Factors  Sitting   Pain Relieving Factors Exercises provided thus far         Hip IR stretching 5 founds x 30" holds with grade III mobilizations.  UPA L5 5 bouts x 30" (relief noted at the listed segment)  CPA L1 5 bouts grade II (patient had pointed to this spot) reports a feeling of relief with this.  Supine bridging 5 bouts x 5 repetitions. (initially felt in lumbar paraspinals). With cuing to lift toes off the bed, she reported she felt it in her gluteals.  Prone pressups with elbows  [facility] [address] [place], [address] Phone: [phone]   Fax:  [phone]  Physical Therapy Treatment  Patient Details  Name: Nyilah Dasilva MRN: [id number] Date of Birth: 1951-02-16 No Data Recorded  Encounter Date: 08/18/2015      PT End of Session - 08/18/15 1624    Visit Number 2   Number of Visits 13   Date for PT Re-Evaluation 10/04/15   PT Start Time [id number]   PT Stop Time 1630   PT Time Calculation (min) 41 min   Activity Tolerance Patient tolerated treatment well;No increased pain   Behavior During Therapy [facility] for tasks assessed/performed      Past Medical History  Diagnosis Date  . Heart murmur   . Hypertension   . Arthritis     pt reports in back & knees  . GERD (gastroesophageal reflux disease)   . History of chicken pox   . Hx of migraine headaches   . History of blood transfusion     during each major surgery per pt  . Genital warts   . Hepatitis B   . HTN (hypertension)   . Colon polyps 2010    At [facility]  . History of torn meniscus of right knee   . Hypopotassemia   . History of hiatal hernia     Past Surgical History  Procedure Laterality Date  . Tonsillectomy    . Vesicovaginal fistula closure w/ tah    . Cesarean section    . Laproscopic left knee repair  1971  . Ectopic pregnancy surgery    . Total abdominal hysterectomy w/ bilateral salpingoophorectomy  2006  . Knee arthroscopy    . Appendectomy  1980  . Abdominal hysterectomy    . Hernia repair    . Colonoscopy with propofol N/A 11/01/2014    Procedure: COLONOSCOPY WITH PROPOFOL;  Surgeon: Manya Silvas, MD;  Location: [facility] ENDOSCOPY;  Service: Endoscopy;  Laterality: N/A;    There were no vitals filed for this visit.  Visit Diagnosis:  Chronic right-sided low back pain without sciatica  Difficulty walking      Subjective Assessment - 08/18/15 1551    Subjective Patient reports she  has started parking further away from the buildings she enters, she has been more aware of her posture recently as well. She has noticed a decline in her symptoms thus far, and appears to be progressing well.    Limitations Sitting;Lifting;Standing;Walking   How long can you sit comfortably? Varies by day, has been getting progressively more painful.    How long can you stand comfortably? Less than 1 hour.    Diagnostic tests X-ray "anterolisthesis at L4-L5"    Patient Stated Goals To play with her grandchildern more.    Currently in Pain? Yes   Pain Score 7    Pain Location Back   Aggravating Factors  Sitting   Pain Relieving Factors Exercises provided thus far         Hip IR stretching 5 founds x 30" holds with grade III mobilizations.  UPA L5 5 bouts x 30" (relief noted at the listed segment)  CPA L1 5 bouts grade II (patient had pointed to this spot) reports a feeling of relief with this.  Supine bridging 5 bouts x 5 repetitions. (initially felt in lumbar paraspinals). With cuing to lift toes off the bed, she reported she felt it in her gluteals.  Prone pressups with elbows

## 2015-08-18 NOTE — Progress Notes (Addendum)
Subjective:    Patient ID: Terri Werner, female    DOB: Aug 30, 1950, 65 y.o.   MRN: [id number]  HPI  65YO female presents for follow up.  Recently seen for low back pain. Started PT this week. Taking Flexeril and Hydrocodone.   Continues to have back pain that is "unbearable" at times. Aching pain. Generally does not radiate and is focal to lower back. No weakness or numbness. Improved with use of Hydrocodone or Flexeril at night, however this medication makes her too drowsy during day. Minimal improvement with Meloxicam.  Wt Readings from Last 3 Encounters:  08/18/15 248 lb (112.492 kg)  08/04/15 252 lb 2 oz (114.363 kg)  12/27/14 242 lb (109.77 kg)   BP Readings from Last 3 Encounters:  08/18/15 136/72  08/04/15 139/75  12/27/14 128/78    Past Medical History  Diagnosis Date  . Heart murmur   . Hypertension   . Arthritis     pt reports in back & knees  . GERD (gastroesophageal reflux disease)   . History of chicken pox   . Hx of migraine headaches   . History of blood transfusion     during each major surgery per pt  . Genital warts   . Hepatitis B   . HTN (hypertension)   . Colon polyps 2010    At [facility]  . History of torn meniscus of right knee   . Hypopotassemia   . History of hiatal hernia    Family History  Problem Relation Age of Onset  . Cancer Sister     breast cancer  . Breast cancer Sister   . Hyperlipidemia    . Hypertension Mother   . Alzheimer's disease Mother   . Dementia Mother   . Hypertension Maternal Grandfather   . Hyperlipidemia Maternal Grandfather   . Birth defects Maternal Grandmother     Bladder  . Colon cancer Maternal Grandmother    Past Surgical History  Procedure Laterality Date  . Tonsillectomy    . Vesicovaginal fistula closure w/ tah    . Cesarean section    . Laproscopic left knee repair  1971  . Ectopic pregnancy surgery    . Total abdominal hysterectomy w/ bilateral salpingoophorectomy  2006  .  Knee arthroscopy    . Appendectomy  1980  . Abdominal hysterectomy    . Hernia repair    . Colonoscopy with propofol N/A 11/01/2014    Procedure: COLONOSCOPY WITH PROPOFOL;  Surgeon: Scot Jun, MD;  Location: [facility] ENDOSCOPY;  Service: Endoscopy;  Laterality: N/A;   Social History   Social History  . Marital Status: Widowed    Spouse Name: N/A  . Number of Children: 2  . Years of Education: N/A   Occupational History  . [employer]     Full Time  .     Social History Main Topics  . Smoking status: Never Smoker   . Smokeless tobacco: Never Used  . Alcohol Use: Yes     Comment: Occasional  . Drug Use: No  . Sexual Activity: Not Asked   Other Topics Concern  . None   Social History Narrative   Lives with mother, who requires full care in [place]. Works for [employer].       Regular Exercise -  NO   Daily Caffeine Use:  1 soda/tea              Review of Systems  Constitutional: Negative for  fever, chills, appetite change, fatigue and unexpected weight change.  Eyes: Negative for visual disturbance.  Respiratory: Negative for shortness of breath.   Cardiovascular: Negative for chest pain and leg swelling.  Gastrointestinal: Negative for abdominal pain.  Musculoskeletal: Positive for myalgias, back pain and arthralgias.  Skin: Negative for color change and rash.  Neurological: Negative for weakness and numbness.  Hematological: Negative for adenopathy. Does not bruise/bleed easily.  Psychiatric/Behavioral: Negative for suicidal ideas, sleep disturbance and dysphoric mood. The patient is not nervous/anxious.        Objective:    BP 136/72 mmHg  Pulse 76  Temp(Src) 98.5 F (36.9 C) (Oral)  Wt 248 lb (112.492 kg)  SpO2 97% Physical Exam  Constitutional: She is oriented to person, place, and time. She appears well-developed and well-nourished. No distress.  HENT:  Head: Normocephalic and atraumatic.  Right Ear: External ear normal.    Left Ear: External ear normal.  Nose: Nose normal.  Mouth/Throat: Oropharynx is clear and moist. No oropharyngeal exudate.  Eyes: Conjunctivae are normal. Pupils are equal, round, and reactive to light. Right eye exhibits no discharge. Left eye exhibits no discharge. No scleral icterus.  Neck: Normal range of motion. Neck supple. No tracheal deviation present. No thyromegaly present.  Cardiovascular: Normal rate, regular rhythm, normal heart sounds and intact distal pulses.  Exam reveals no gallop and no friction rub.   No murmur heard. Pulmonary/Chest: Effort normal and breath sounds normal. No respiratory distress. She has no wheezes. She has no rales. She exhibits no tenderness.  Musculoskeletal: She exhibits no edema.       Lumbar back: She exhibits decreased range of motion, tenderness and pain.  Lymphadenopathy:    She has no cervical adenopathy.  Neurological: She is alert and oriented to person, place, and time. No cranial nerve deficit. She exhibits normal muscle tone. Coordination normal.  Skin: Skin is warm and dry. No rash noted. She is not diaphoretic. No erythema. No pallor.  Psychiatric: She has a normal mood and affect. Her behavior is normal. Judgment and thought content normal.          Assessment & Plan:   Problem List Items Addressed This Visit      Unprioritized   Low back pain - Primary    Persistent back pain. Started with PT this week. Plain xray shows DJD at L4-5. Despite Flexeril and Hydrocodone, pain continues to be severe at times. Will get MRI lumbar spine and set up evaluation with Dr. Yves Dill. Question if she may be candidate for ESI.      Relevant Orders   MR Lumbar Spine Wo Contrast   AMB referral to orthopedics       Return in about 4 weeks (around 09/15/2015) for Recheck.  Ronna Polio, MD Internal Medicine [facility]

## 2015-08-18 NOTE — Progress Notes (Signed)
Pre visit review using our clinic review tool, if applicable. No additional management support is needed unless otherwise documented below in the visit note. 

## 2015-08-23 ENCOUNTER — Ambulatory Visit: Payer: Managed Care, Other (non HMO) | Admitting: Physical Therapy

## 2015-08-23 DIAGNOSIS — M545 Low back pain: Principal | ICD-10-CM

## 2015-08-23 DIAGNOSIS — G8929 Other chronic pain: Secondary | ICD-10-CM

## 2015-08-23 DIAGNOSIS — R262 Difficulty in walking, not elsewhere classified: Secondary | ICD-10-CM

## 2015-08-23 NOTE — Therapy (Signed)
[facility] PHYSICAL AND SPOR[facility] MEDICINE [address], [place], [address] Phone: [phone]   Fax:  [phone]  Physical Therapy Treatment  Patient Details  Name: Terri Werner MRN: [id number] Date of Birth: June 04, 1950 No Data Recorded  Encounter Date: 08/23/2015      PT End of Session - 08/23/15 1623    Visit Number 3   Number of Visits 13   Date for PT Re-Evaluation 10/04/15   PT Start Time 1605   PT Stop Time 1630   PT Time Calculation (min) 25 min   Activity Tolerance Patient tolerated treatment well;No increased pain   Behavior During Therapy [facility] for tasks assessed/performed      Past Medical History  Diagnosis Date  . Heart murmur   . Hypertension   . Arthritis     pt reports in back & knees  . GERD (gastroesophageal reflux disease)   . History of chicken pox   . Hx of migraine headaches   . History of blood transfusion     during each major surgery per pt  . Genital warts   . Hepatitis B   . HTN (hypertension)   . Colon polyps 2010    At [facility]  . History of torn meniscus of right knee   . Hypopotassemia   . History of hiatal hernia     Past Surgical History  Procedure Laterality Date  . Tonsillectomy    . Vesicovaginal fistula closure w/ tah    . Cesarean section    . Laproscopic left knee repair  1971  . Ectopic pregnancy surgery    . Total abdominal hysterectomy w/ bilateral salpingoophorectomy  2006  . Knee arthroscopy    . Appendectomy  1980  . Abdominal hysterectomy    . Hernia repair    . Colonoscopy with propofol N/A 11/01/2014    Procedure: COLONOSCOPY WITH PROPOFOL;  Surgeon: Scot Jun, MD;  Location: [facility] ENDOSCOPY;  Service: Endoscopy;  Laterality: N/A;    There were no vitals filed for this visit.      Subjective Assessment - 08/23/15 1606    Subjective Patient reports she has had quite a lot of stress today and had an emergency phone call, which is why she was  running late. She reports she is having more pain today, but less overall roughly a 4 or 5 all weekend. She was able to play with her grandchildren this weekend, though not to the level she would like.    Limitations Sitting;Lifting;Standing;Walking   How long can you sit comfortably? Varies by day, has been getting progressively more painful.    How long can you stand comfortably? Less than 1 hour.    Diagnostic tests X-ray "anterolisthesis at L4-L5"    Patient Stated Goals     Currently in Pain? Yes   Pain Score 5    Pain Location Back   Pain Orientation Right;Lower   Pain Radiating Towards R gluteals    Pain Onset More than a month ago   Pain Frequency Constant   Aggravating Factors  Sitting    Pain Relieving Factors Rehab thus far has been helpful      Manual therapy  Soft tissue ischemic trigger point mobilization over painful area in R gluteal musculature, patient reported relief in symptoms after tx with ambulation.  Grade III-IV mobilizations over L1 CPA, patient reported sensation of relief with treatment. After 5 bouts x 15-30", patient reported her symptoms were decreased to  a 3/10.   Foam roller standing extensions 2 bouts x 1 minute, patient reported she felt much improvement during, and that she would benefit from postural cuing from foam roller with ambulation.                             PT Education - 08/23/15 1623    Education provided Yes   Education Details Progress walking program, find ways to de-stress when she gets home from work.    Person(s) Educated Patient   Methods Explanation;Demonstration   Comprehension Verbalized understanding;Returned demonstration             PT Long Term Goals - 08/16/15 1635    PT LONG TERM GOAL #1   Title Patient will report an ODI score of less than 30% disability to demonstrate improved tolerance for functional activities.    Baseline 42%   Time 6   Period Weeks   Status New   PT LONG TERM  GOAL #2   Title Patient will demonstrate full AROM of her lumbar spine with no increase in pain to demonstrate improved tolerance for ADLs.    Baseline Pain with flexion, R side bend, L rotation    Time 6   Period Weeks   Status New   PT LONG TERM GOAL #3   Title Patient will tolerate 30 minutes of sitting with mild increase in pain symptoms to demonstrate improved tolerance for work related activities.    Time 6   Period Weeks   Status New   PT LONG TERM GOAL #4   Title Patient will walk 1 mile with no increase in back pain to demonstrate improved ability to perform recreational activities.    Time 6   Period Weeks   Status New               Plan - 08/23/15 1631    Clinical Impression Statement Patient continues to present with decreased symptoms, reports she is benefitting from increased walking by parking further away. She continues to point to two spots, one in upper gluteal musculature on R and the other around L1 midline that are tender. She reports relief after manual tx, from a 5/10 to a 3/10 after tx.    Rehab Potential Fair   Clinical Impairments Affecting Rehab Potential Body habitus, sitting for her occupation, chronic low back pain, psychosocial factors (stress/depression at work)   PT Frequency 2x / week   PT Duration 6 weeks   PT Treatment/Interventions Electrical Stimulation;Moist Heat;Therapeutic exercise;Therapeutic activities;Manual techniques;Balance training;Taping;Gait training;Stair training   PT Next Visit Plan Treatment of hip IR, hip flexors, bridging/NM control of lumbopelvic region, quadricep stretching   PT Home Exercise Plan See patient instructions.    Consulted and Agree with Plan of Care Patient      Patient will benefit from skilled therapeutic intervention in order to improve the following deficits and impairments:  Abnormal gait, Difficulty walking, Decreased range of motion, Pain, Impaired flexibility, Decreased strength, Decreased activity  tolerance  Visit Diagnosis: Chronic right-sided low back pain without sciatica  Difficulty walking     Problem List Patient Active Problem List   Diagnosis Date Noted  . Low back pain 08/18/2015  . Lumbago 08/04/2015  . Arthritis of left lower extremity 11/26/2014  . Diverticulitis of colon 07/05/2014  . Nonallopathic lesion of cervical region 06/09/2014  . Nonallopathic lesion of thoracic region 06/09/2014  . Nonallopathic lesion of lumbar region 06/09/2014  .  Rotator cuff tear 05/20/2014  . Carotid artery disorder (HCC) 04/27/2014  . Neck pain on right side 04/15/2014  . Dizziness 04/15/2014  . Hyperlipidemia 10/08/2013  . Routine general medical examination at a health care facility 09/07/2013  . Chest pain 09/07/2013  . Screening for breast cancer 06/22/2011  . Arthralgia 06/22/2011  . HYPERTENSION, BENIGN 11/19/2009   Kerin Ransom, PT, DPT    08/23/2015, 6:07 PM  [facility] PHYSICAL AND SPORTS MEDICINE [address] [place], [address] Phone: [phone]   Fax:  [phone]  Name: Terri Werner MRN: [id number] Date of Birth: [birth year]

## 2015-08-25 ENCOUNTER — Ambulatory Visit: Payer: Managed Care, Other (non HMO) | Admitting: Physical Therapy

## 2015-09-07 ENCOUNTER — Ambulatory Visit
Admission: RE | Admit: 2015-09-07 | Discharge: 2015-09-07 | Disposition: A | Discharge status: Home / Self Care | Payer: Managed Care, Other (non HMO) | Source: Ambulatory Visit | Attending: Internal Medicine | Admitting: Internal Medicine

## 2015-09-07 DIAGNOSIS — M47816 Spondylosis without myelopathy or radiculopathy, lumbar region: Secondary | ICD-10-CM | POA: Insufficient documentation

## 2015-09-07 DIAGNOSIS — M5137 Other intervertebral disc degeneration, lumbosacral region: Secondary | ICD-10-CM | POA: Insufficient documentation

## 2015-09-07 DIAGNOSIS — M545 Low back pain: Secondary | ICD-10-CM | POA: Insufficient documentation

## 2015-09-14 ENCOUNTER — Other Ambulatory Visit: Payer: Self-pay | Admitting: Cardiovascular Disease

## 2015-09-14 NOTE — Telephone Encounter (Signed)
Patient was last seen in 2015 by Dr. Rockey Situ and was requesting refill on pravachol. Let her know that we would need to schedule her an appointment in order to fill this or she could check with her primary care physician. Patient stated that she did not want to schedule another appointment at this time due to having some back problems. She stated that she had an upcoming appointment with Dr. Gilford Rile and would check with her about getting a refill. Let her know to please call us back if she would like to schedule an appointment. She verbalized understanding of our conversation and was very Patent attorney. Patient had no further questions at this time.

## 2015-09-23 ENCOUNTER — Encounter: Payer: Self-pay | Admitting: Internal Medicine

## 2015-09-23 ENCOUNTER — Ambulatory Visit (INDEPENDENT_AMBULATORY_CARE_PROVIDER_SITE_OTHER): Payer: Managed Care, Other (non HMO) | Admitting: Internal Medicine

## 2015-09-23 VITALS — BP 126/84 | HR 86 | Temp 98.0°F | Ht 63.0 in | Wt 246.2 lb

## 2015-09-23 DIAGNOSIS — G8929 Other chronic pain: Secondary | ICD-10-CM | POA: Diagnosis not present

## 2015-09-23 DIAGNOSIS — M545 Low back pain, unspecified: Secondary | ICD-10-CM

## 2015-09-23 NOTE — Patient Instructions (Signed)
Continue current medications.  Follow-up in 4 weeks

## 2015-09-23 NOTE — Progress Notes (Signed)
Pre visit review using our clinic review tool, if applicable. No additional management support is needed unless otherwise documented below in the visit note. 

## 2015-09-23 NOTE — Assessment & Plan Note (Signed)
Reviewed recent MRI and notes from Dr. Sharlet Salina. Nerve impingement at L5-S1 causing pain. Encouraged her to continue Meloxicam, Flexeril, and prn Hydrocodone and Gabapentin. Discussed titrating up dosing on Gabapentin. Will follow up again after ESI and prn.

## 2015-09-23 NOTE — Progress Notes (Signed)
Subjective:    Patient ID: Terri Werner, female    DOB: Nov 01, 1950, 65 y.o.   MRN: [id number]  HPI  65YO female presents for follow up.  Last seen for lumbar back pain. Referral placed to Dr. Yves Dill and MRI lumbar spine ordered. MRI lumbar spine showed degenerative disc disease and moderate impingement at L5-S1, with mild impingement at L3-4 and L4-5. She has been scheduled for ESI with Dr. Yves Dill June 1st. Gabapentin was added.  Continues to have pain that radiates down left lateral leg. Some improvement with gabapentin, however concerned about drowsiness. Wakes at 4am most nights. Has to sleep on her recliner.   Wt Readings from Last 3 Encounters:  09/23/15 246 lb 3.2 oz (111.676 kg)  08/18/15 248 lb (112.492 kg)  08/04/15 252 lb 2 oz (114.363 kg)   BP Readings from Last 3 Encounters:  09/23/15 126/84  08/18/15 136/72  08/04/15 139/75    Past Medical History  Diagnosis Date  . Heart murmur   . Hypertension   . Arthritis     pt reports in back & knees  . GERD (gastroesophageal reflux disease)   . History of chicken pox   . Hx of migraine headaches   . History of blood transfusion     during each major surgery per pt  . Genital warts   . Hepatitis B   . HTN (hypertension)   . Colon polyps 2010    At [facility]  . History of torn meniscus of right knee   . Hypopotassemia   . History of hiatal hernia    Family History  Problem Relation Age of Onset  . Cancer Sister     breast cancer  . Breast cancer Sister   . Hyperlipidemia    . Hypertension Mother   . Alzheimer's disease Mother   . Dementia Mother   . Hypertension Maternal Grandfather   . Hyperlipidemia Maternal Grandfather   . Birth defects Maternal Grandmother     Bladder  . Colon cancer Maternal Grandmother    Past Surgical History  Procedure Laterality Date  . Tonsillectomy    . Vesicovaginal fistula closure w/ tah    . Cesarean section    . Laproscopic left knee repair  1971  .  Ectopic pregnancy surgery    . Total abdominal hysterectomy w/ bilateral salpingoophorectomy  2006  . Knee arthroscopy    . Appendectomy  1980  . Abdominal hysterectomy    . Hernia repair    . Colonoscopy with propofol N/A 11/01/2014    Procedure: COLONOSCOPY WITH PROPOFOL;  Surgeon: Scot Jun, MD;  Location: [facility] ENDOSCOPY;  Service: Endoscopy;  Laterality: N/A;   Social History   Social History  . Marital Status: Widowed    Spouse Name: N/A  . Number of Children: 2  . Years of Education: N/A   Occupational History  . [employer]     Full Time  .     Social History Main Topics  . Smoking status: Never Smoker   . Smokeless tobacco: Never Used  . Alcohol Use: Yes     Comment: Occasional  . Drug Use: No  . Sexual Activity: Not Asked   Other Topics Concern  . None   Social History Narrative   Lives with mother, who requires full care in [place]. Works for [employer].       Regular Exercise -  NO   Daily Caffeine Use:  1 soda/tea  Review of Systems  Constitutional: Negative for fever, chills, appetite change, fatigue and unexpected weight change.  Eyes: Negative for visual disturbance.  Respiratory: Negative for shortness of breath.   Cardiovascular: Negative for chest pain and leg swelling.  Gastrointestinal: Negative for abdominal pain.  Musculoskeletal: Positive for myalgias, back pain and arthralgias.  Skin: Negative for color change and rash.  Neurological: Negative for weakness and numbness.  Hematological: Negative for adenopathy. Does not bruise/bleed easily.  Psychiatric/Behavioral: Negative for dysphoric mood. The patient is not nervous/anxious.        Objective:    BP 126/84 mmHg  Pulse 86  Temp(Src) 98 F (36.7 C) (Oral)  Ht 5\' 3"  (1.6 m)  Wt 246 lb 3.2 oz (111.676 kg)  BMI 43.62 kg/m2  SpO2 94% Physical Exam  Constitutional: She is oriented to person, place, and time. She appears well-developed and  well-nourished. No distress.  HENT:  Head: Normocephalic and atraumatic.  Right Ear: External ear normal.  Left Ear: External ear normal.  Nose: Nose normal.  Mouth/Throat: Oropharynx is clear and moist.  Eyes: Conjunctivae are normal. Pupils are equal, round, and reactive to light. Right eye exhibits no discharge. Left eye exhibits no discharge. No scleral icterus.  Neck: Normal range of motion. Neck supple. No tracheal deviation present. No thyromegaly present.  Cardiovascular: Normal rate, regular rhythm, normal heart sounds and intact distal pulses.  Exam reveals no gallop and no friction rub.   No murmur heard. Pulmonary/Chest: Effort normal and breath sounds normal. No respiratory distress. She has no wheezes. She has no rales. She exhibits no tenderness.  Musculoskeletal: She exhibits no edema.       Lumbar back: She exhibits decreased range of motion, tenderness and pain.  Lymphadenopathy:    She has no cervical adenopathy.  Neurological: She is alert and oriented to person, place, and time. No cranial nerve deficit. She exhibits normal muscle tone. Coordination normal.  Skin: Skin is warm and dry. No rash noted. She is not diaphoretic. No erythema. No pallor.  Psychiatric: She has a normal mood and affect. Her behavior is normal. Judgment and thought content normal.          Assessment & Plan:  Over of which >50% spent in face-to-face contact with patient discussing plan of care  Problem List Items Addressed This Visit      Unprioritized   Lumbago - Primary    Reviewed recent MRI and notes from Dr. Yves Dill. Nerve impingement at L5-S1 causing pain. Encouraged her to continue Meloxicam, Flexeril, and prn Hydrocodone and Gabapentin. Discussed titrating up dosing on Gabapentin. Will follow up again after ESI and prn.          Return in about 4 weeks (around 10/21/2015) for Recheck.  Ronna Polio, MD Internal Medicine [facility]

## 2015-10-01 ENCOUNTER — Encounter: Payer: Self-pay | Admitting: Internal Medicine

## 2015-10-25 ENCOUNTER — Encounter: Payer: Self-pay | Admitting: Internal Medicine

## 2015-10-25 ENCOUNTER — Ambulatory Visit (INDEPENDENT_AMBULATORY_CARE_PROVIDER_SITE_OTHER): Payer: Managed Care, Other (non HMO) | Admitting: Internal Medicine

## 2015-10-25 VITALS — BP 140/86 | HR 76 | Ht 63.0 in | Wt 245.6 lb

## 2015-10-25 DIAGNOSIS — G8929 Other chronic pain: Secondary | ICD-10-CM | POA: Diagnosis not present

## 2015-10-25 DIAGNOSIS — M545 Low back pain, unspecified: Secondary | ICD-10-CM

## 2015-10-25 MED ORDER — PRAVASTATIN SODIUM 20 MG PO TABS: 20.0000 mg | ORAL_TABLET | Freq: Every day | ORAL | Status: DC

## 2015-10-25 MED ORDER — TRAMADOL HCL 50 MG PO TABS: 50.0000 mg | ORAL_TABLET | Freq: Three times a day (TID) | ORAL | Status: DC | PRN

## 2015-10-25 NOTE — Patient Instructions (Signed)
Stop Gabapentin.  Start Tramadol 50-100mg  every 8 hours as needed.

## 2015-10-25 NOTE — Progress Notes (Signed)
Subjective:    Patient ID: Terri Werner, female    DOB: 10/28/1950, 65 y.o.   MRN: [id number]  HPI  65YO female presents for follow up.  Low back pain - Recently seen for low back pain. MRI showed compression at L5-S1. Underwent ESI with Dr. Yves Dill. Had slight improvement in pain. Has follow up in 2 weeks. Plans for second ESI then. Unable to tolerate gabapentin. Feels dizzy and lethargic on this. Feel asleep driving. Continues to have excrutiating left leg pain, particularly with lying flat. Has also tried PT, accupuncture with no improvement.  Wt Readings from Last 3 Encounters:  10/25/15 245 lb 9.6 oz (111.403 kg)  09/23/15 246 lb 3.2 oz (111.676 kg)  08/18/15 248 lb (112.492 kg)   BP Readings from Last 3 Encounters:  10/25/15 140/86  09/23/15 126/84  08/18/15 136/72    Past Medical History  Diagnosis Date  . Heart murmur   . Hypertension   . Arthritis     pt reports in back & knees  . GERD (gastroesophageal reflux disease)   . History of chicken pox   . Hx of migraine headaches   . History of blood transfusion     during each major surgery per pt  . Genital warts   . Hepatitis B   . HTN (hypertension)   . Colon polyps 2010    At [facility]  . History of torn meniscus of right knee   . Hypopotassemia   . History of hiatal hernia    Family History  Problem Relation Age of Onset  . Cancer Sister     breast cancer  . Breast cancer Sister   . Hyperlipidemia    . Hypertension Mother   . Alzheimer's disease Mother   . Dementia Mother   . Hypertension Maternal Grandfather   . Hyperlipidemia Maternal Grandfather   . Birth defects Maternal Grandmother     Bladder  . Colon cancer Maternal Grandmother    Past Surgical History  Procedure Laterality Date  . Tonsillectomy    . Vesicovaginal fistula closure w/ tah    . Cesarean section    . Laproscopic left knee repair  1971  . Ectopic pregnancy surgery    . Total abdominal hysterectomy w/ bilateral  salpingoophorectomy  2006  . Knee arthroscopy    . Appendectomy  1980  . Abdominal hysterectomy    . Hernia repair    . Colonoscopy with propofol N/A 11/01/2014    Procedure: COLONOSCOPY WITH PROPOFOL;  Surgeon: Scot Jun, MD;  Location: [facility] ENDOSCOPY;  Service: Endoscopy;  Laterality: N/A;   Social History   Social History  . Marital Status: Widowed    Spouse Name: N/A  . Number of Children: 2  . Years of Education: N/A   Occupational History  . [employer]     Full Time  .     Social History Main Topics  . Smoking status: Never Smoker   . Smokeless tobacco: Never Used  . Alcohol Use: Yes     Comment: Occasional  . Drug Use: No  . Sexual Activity: Not Asked   Other Topics Concern  . None   Social History Narrative   Lives with mother, who requires full care in [place]. Works for [employer].       Regular Exercise -  NO   Daily Caffeine Use:  1 soda/tea              Review of  Systems  Constitutional: Negative for fever, chills, appetite change, fatigue and unexpected weight change.  Eyes: Negative for visual disturbance.  Respiratory: Negative for shortness of breath.   Cardiovascular: Negative for chest pain and leg swelling.  Gastrointestinal: Negative for nausea, vomiting, abdominal pain, diarrhea and constipation.  Musculoskeletal: Positive for myalgias, back pain and arthralgias.  Skin: Negative for color change and rash.  Hematological: Negative for adenopathy. Does not bruise/bleed easily.  Psychiatric/Behavioral: Negative for sleep disturbance and dysphoric mood. The patient is not nervous/anxious.        Objective:    BP 140/86 mmHg  Pulse 76  Ht 5\' 3"  (1.6 m)  Wt 245 lb 9.6 oz (111.403 kg)  BMI 43.52 kg/m2  SpO2 98% Physical Exam  Constitutional: She is oriented to person, place, and time. She appears well-developed and well-nourished. No distress.  HENT:  Head: Normocephalic and atraumatic.  Right Ear:  External ear normal.  Left Ear: External ear normal.  Nose: Nose normal.  Mouth/Throat: Oropharynx is clear and moist. No oropharyngeal exudate.  Eyes: Conjunctivae are normal. Pupils are equal, round, and reactive to light. Right eye exhibits no discharge. Left eye exhibits no discharge. No scleral icterus.  Neck: Normal range of motion. Neck supple. No tracheal deviation present. No thyromegaly present.  Cardiovascular: Normal rate, regular rhythm, normal heart sounds and intact distal pulses.  Exam reveals no gallop and no friction rub.   No murmur heard. Pulmonary/Chest: Effort normal and breath sounds normal. No respiratory distress. She has no wheezes. She has no rales. She exhibits no tenderness.  Musculoskeletal: Normal range of motion. She exhibits no edema or tenderness.       Lumbar back: She exhibits pain. She exhibits normal range of motion and no tenderness.  Lymphadenopathy:    She has no cervical adenopathy.  Neurological: She is alert and oriented to person, place, and time. No cranial nerve deficit. She exhibits normal muscle tone. Coordination normal.  Skin: Skin is warm and dry. No rash noted. She is not diaphoretic. No erythema. No pallor.  Psychiatric: She has a normal mood and affect. Her behavior is normal. Judgment and thought content normal.          Assessment & Plan:   Problem List Items Addressed This Visit      Unprioritized   Lumbago - Primary    Low back pain. MRI showed nerve impingement at L5-S1.  S/p ESI x1 and planning for second ESI. Will stop Gabapentin given sedation. Start Tramadol prn for severe pain. Follow up in 4 weeks and prn.      Relevant Medications   traMADol (ULTRAM) 50 MG tablet       Return in about 4 weeks (around 11/22/2015) for Recheck.  Ronna Polio, MD Internal Medicine [facility]

## 2015-10-25 NOTE — Assessment & Plan Note (Signed)
Low back pain. MRI showed nerve impingement at L5-S1.  S/p ESI x1 and planning for second ESI. Will stop Gabapentin given sedation. Start Tramadol prn for severe pain. Follow up in 4 weeks and prn.

## 2015-10-25 NOTE — Progress Notes (Signed)
Pre visit review using our clinic review tool, if applicable. No additional management support is needed unless otherwise documented below in the visit note. 

## 2015-11-03 ENCOUNTER — Other Ambulatory Visit: Payer: Self-pay | Admitting: Obstetrics and Gynecology

## 2015-11-03 DIAGNOSIS — Z1231 Encounter for screening mammogram for malignant neoplasm of breast: Secondary | ICD-10-CM

## 2015-11-11 ENCOUNTER — Ambulatory Visit
Admission: RE | Admit: 2015-11-11 | Discharge: 2015-11-11 | Disposition: A | Discharge status: Home / Self Care | Payer: Managed Care, Other (non HMO) | Source: Ambulatory Visit | Attending: Obstetrics and Gynecology | Admitting: Obstetrics and Gynecology

## 2015-11-11 DIAGNOSIS — Z1231 Encounter for screening mammogram for malignant neoplasm of breast: Secondary | ICD-10-CM

## 2015-11-19 ENCOUNTER — Other Ambulatory Visit: Payer: Self-pay | Admitting: Internal Medicine

## 2015-12-07 ENCOUNTER — Ambulatory Visit (INDEPENDENT_AMBULATORY_CARE_PROVIDER_SITE_OTHER): Payer: Managed Care, Other (non HMO) | Admitting: Internal Medicine

## 2015-12-07 ENCOUNTER — Encounter: Payer: Self-pay | Admitting: Internal Medicine

## 2015-12-07 VITALS — BP 144/78 | HR 68 | Ht 61.0 in | Wt 243.8 lb

## 2015-12-07 DIAGNOSIS — M545 Low back pain: Secondary | ICD-10-CM | POA: Diagnosis not present

## 2015-12-07 DIAGNOSIS — G8929 Other chronic pain: Secondary | ICD-10-CM | POA: Diagnosis not present

## 2015-12-07 MED ORDER — LIDOCAINE 5 % EX PTCH: 1.0000 | MEDICATED_PATCH | CUTANEOUS | 3 refills | Status: DC

## 2015-12-07 NOTE — Assessment & Plan Note (Addendum)
Persistent low back pain. MRI showed nerve impingement at L5-S1. S/p ESI x 2 with minimal improvement. Using Tramadol prn. Will start topical Lidoderm patch and prn TENS unit. Follow up with Dr. Sharlet Salina as scheduled. Also discussed adding Lyrica in the future if persistent symptoms. Also encouraged her to consider activity such as yoga.

## 2015-12-07 NOTE — Patient Instructions (Signed)
Start TENS unit and Lidoderm patch.  Follow up 80months.

## 2015-12-07 NOTE — Progress Notes (Signed)
Pre visit review using our clinic review tool, if applicable. No additional management support is needed unless otherwise documented below in the visit note. 

## 2015-12-07 NOTE — Progress Notes (Signed)
Subjective:    Patient ID: Terri Werner, female    DOB: 1951/05/13, 65 y.o.   MRN: [id number]  HPI  65YO female presents for follow up  Back pain - Stopped Gabapentin. Using Tramadol only as needed at night. Tolerating well. Continues on Meloxicam. Has follow up with Dr. Yves Dill next month.Used a TENS unit from a friend with improvement. Also tried Lidoderm patch with improvement.   Wt Readings from Last 3 Encounters:  12/07/15 243 lb 12.8 oz (110.6 kg)  10/25/15 245 lb 9.6 oz (111.4 kg)  09/23/15 246 lb 3.2 oz (111.7 kg)   BP Readings from Last 3 Encounters:  12/07/15 (!) 144/78  10/25/15 140/86  09/23/15 126/84    Past Medical History:  Diagnosis Date  . Arthritis    pt reports in back & knees  . Colon polyps 2010   At [facility]  . Genital warts   . GERD (gastroesophageal reflux disease)   . Heart murmur   . Hepatitis B   . History of blood transfusion    during each major surgery per pt  . History of chicken pox   . History of hiatal hernia   . History of torn meniscus of right knee   . HTN (hypertension)   . Hx of migraine headaches   . Hypertension   . Hypopotassemia    Family History  Problem Relation Age of Onset  . Cancer Sister     breast cancer  . Breast cancer Sister   . Hyperlipidemia    . Hypertension Mother   . Alzheimer's disease Mother   . Dementia Mother   . Hypertension Maternal Grandfather   . Hyperlipidemia Maternal Grandfather   . Birth defects Maternal Grandmother     Bladder  . Colon cancer Maternal Grandmother    Past Surgical History:  Procedure Laterality Date  . ABDOMINAL HYSTERECTOMY    . APPENDECTOMY  1980  . CESAREAN SECTION    . COLONOSCOPY WITH PROPOFOL N/A 11/01/2014   Procedure: COLONOSCOPY WITH PROPOFOL;  Surgeon: Scot Jun, MD;  Location: [facility] ENDOSCOPY;  Service: Endoscopy;  Laterality: N/A;  . ECTOPIC PREGNANCY SURGERY    . HERNIA REPAIR    . KNEE ARTHROSCOPY    . laproscopic Left knee repair   1971  . TONSILLECTOMY    . TOTAL ABDOMINAL HYSTERECTOMY W/ BILATERAL SALPINGOOPHORECTOMY  2006  . VESICOVAGINAL FISTULA CLOSURE W/ TAH     Social History   Social History  . Marital status: Widowed    Spouse name: N/A  . Number of children: 2  . Years of education: N/A   Occupational History  . [employer] [place]     Full Time  .  [employer]   Social History Main Topics  . Smoking status: Never Smoker  . Smokeless tobacco: Never Used  . Alcohol use Yes     Comment: Occasional  . Drug use: No  . Sexual activity: Not Asked   Other Topics Concern  . None   Social History Narrative   Lives with mother, who requires full care in [place]. Works for [employer].       Regular Exercise -  NO   Daily Caffeine Use:  1 soda/tea              Review of Systems  Constitutional: Negative for appetite change, chills, fatigue, fever and unexpected weight change.  Eyes: Negative for visual disturbance.  Respiratory: Negative for shortness of  breath.   Cardiovascular: Negative for chest pain and leg swelling.  Gastrointestinal: Negative for abdominal pain.  Musculoskeletal: Positive for arthralgias, back pain and myalgias.  Skin: Negative for color change and rash.  Neurological: Negative for weakness and numbness.  Hematological: Negative for adenopathy. Does not bruise/bleed easily.  Psychiatric/Behavioral: Negative for dysphoric mood. The patient is not nervous/anxious.        Objective:    BP (!) 144/78 (BP Location: Left Arm, Patient Position: Sitting, Cuff Size: Large)   Pulse 68   Ht 5\' 1"  (1.549 m)   Wt 243 lb 12.8 oz (110.6 kg)   SpO2 98%   BMI 46.07 kg/m  Physical Exam  Constitutional: She is oriented to person, place, and time. She appears well-developed and well-nourished. No distress.  HENT:  Head: Normocephalic and atraumatic.  Right Ear: External ear normal.  Left Ear: External ear normal.  Nose: Nose normal.    Mouth/Throat: Oropharynx is clear and moist. No oropharyngeal exudate.  Eyes: Conjunctivae are normal. Pupils are equal, round, and reactive to light. Right eye exhibits no discharge. Left eye exhibits no discharge. No scleral icterus.  Neck: Normal range of motion. Neck supple. No tracheal deviation present. No thyromegaly present.  Cardiovascular: Normal rate, regular rhythm, normal heart sounds and intact distal pulses.  Exam reveals no gallop and no friction rub.   No murmur heard. Pulmonary/Chest: Effort normal and breath sounds normal. No respiratory distress. She has no wheezes. She has no rales. She exhibits no tenderness.  Musculoskeletal: Normal range of motion. She exhibits no edema or tenderness.       Lumbar back: She exhibits pain. She exhibits normal range of motion and no tenderness.  Lymphadenopathy:    She has no cervical adenopathy.  Neurological: She is alert and oriented to person, place, and time. No cranial nerve deficit. She exhibits normal muscle tone. Coordination normal.  Skin: Skin is warm and dry. No rash noted. She is not diaphoretic. No erythema. No pallor.  Psychiatric: She has a normal mood and affect. Her behavior is normal. Judgment and thought content normal.          Assessment & Plan:  Over of which >50% spent in face-to-face contact with patient discussing plan of care  Problem List Items Addressed This Visit      Unprioritized   Lumbago - Primary    Persistent low back pain. MRI showed nerve impingement at L5-S1. S/p ESI x 2 with minimal improvement. Using Tramadol prn. Will start topical Lidoderm patch and prn TENS unit. Follow up with Dr. Yves Dill as scheduled. Also discussed adding Lyrica in the future if persistent symptoms. Also encouraged her to consider activity such as yoga.       Other Visit Diagnoses   None.      Return in about 3 months (around 03/08/2016) for New Patient.  Ronna Polio, MD Internal Medicine [facility]

## 2016-01-23 ENCOUNTER — Other Ambulatory Visit: Payer: Self-pay | Admitting: Neurosurgery

## 2016-01-23 ENCOUNTER — Other Ambulatory Visit (HOSPITAL_COMMUNITY): Payer: Self-pay | Admitting: Neurosurgery

## 2016-01-23 DIAGNOSIS — R292 Abnormal reflex: Secondary | ICD-10-CM

## 2016-02-07 ENCOUNTER — Ambulatory Visit
Admission: RE | Admit: 2016-02-07 | Discharge: 2016-02-07 | Disposition: A | Discharge status: Home / Self Care | Payer: Managed Care, Other (non HMO) | Source: Ambulatory Visit | Attending: Neurosurgery | Admitting: Neurosurgery

## 2016-02-07 DIAGNOSIS — R292 Abnormal reflex: Secondary | ICD-10-CM | POA: Diagnosis present

## 2016-02-07 DIAGNOSIS — M50322 Other cervical disc degeneration at C5-C6 level: Secondary | ICD-10-CM | POA: Diagnosis not present

## 2016-02-07 DIAGNOSIS — M4802 Spinal stenosis, cervical region: Secondary | ICD-10-CM | POA: Insufficient documentation

## 2016-03-12 ENCOUNTER — Encounter: Payer: Self-pay | Admitting: Family

## 2016-03-12 ENCOUNTER — Encounter: Payer: Managed Care, Other (non HMO) | Admitting: Family

## 2016-03-12 NOTE — Progress Notes (Signed)
Pre visit review using our clinic review tool, if applicable. No additional management support is needed unless otherwise documented below in the visit note. 

## 2016-03-26 ENCOUNTER — Telehealth: Payer: Managed Care, Other (non HMO) | Admitting: Family

## 2016-03-26 DIAGNOSIS — B9789 Other viral agents as the cause of diseases classified elsewhere: Secondary | ICD-10-CM

## 2016-03-26 DIAGNOSIS — J329 Chronic sinusitis, unspecified: Secondary | ICD-10-CM

## 2016-03-26 MED ORDER — FLUTICASONE PROPIONATE 50 MCG/ACT NA SUSP: 2.0000 | Freq: Every day | NASAL | 6 refills | Status: DC

## 2016-03-26 NOTE — Progress Notes (Signed)

## 2016-03-29 NOTE — Progress Notes (Signed)
This encounter was created in error - please disregard.

## 2016-05-24 ENCOUNTER — Other Ambulatory Visit: Payer: Self-pay

## 2016-05-24 MED ORDER — AMLODIPINE BESYLATE 10 MG PO TABS: 10.0000 mg | ORAL_TABLET | Freq: Every day | ORAL | 4 refills | Status: DC

## 2016-05-24 NOTE — Telephone Encounter (Signed)
Medication has been refilled.  Patient must see new PCP before reciving more refills of amlodipine.

## 2016-06-20 IMAGING — CR DG LUMBAR SPINE COMPLETE 4+V
1 series · 5 of 5 positions shown · non-contrast
Comparison: 11/15/2009 and CT 06/22/2014

CLINICAL DATA: Chronic low back pain worse over the past month. No
injury. Pain radiates to right lower extremity.

EXAM:
LUMBAR SPINE - COMPLETE 4+ VIEW

[Series 1: dg lumbar spine complete 4 +v · 0.14mm/px · 5 of 5 slices shown]
[im 1/5]
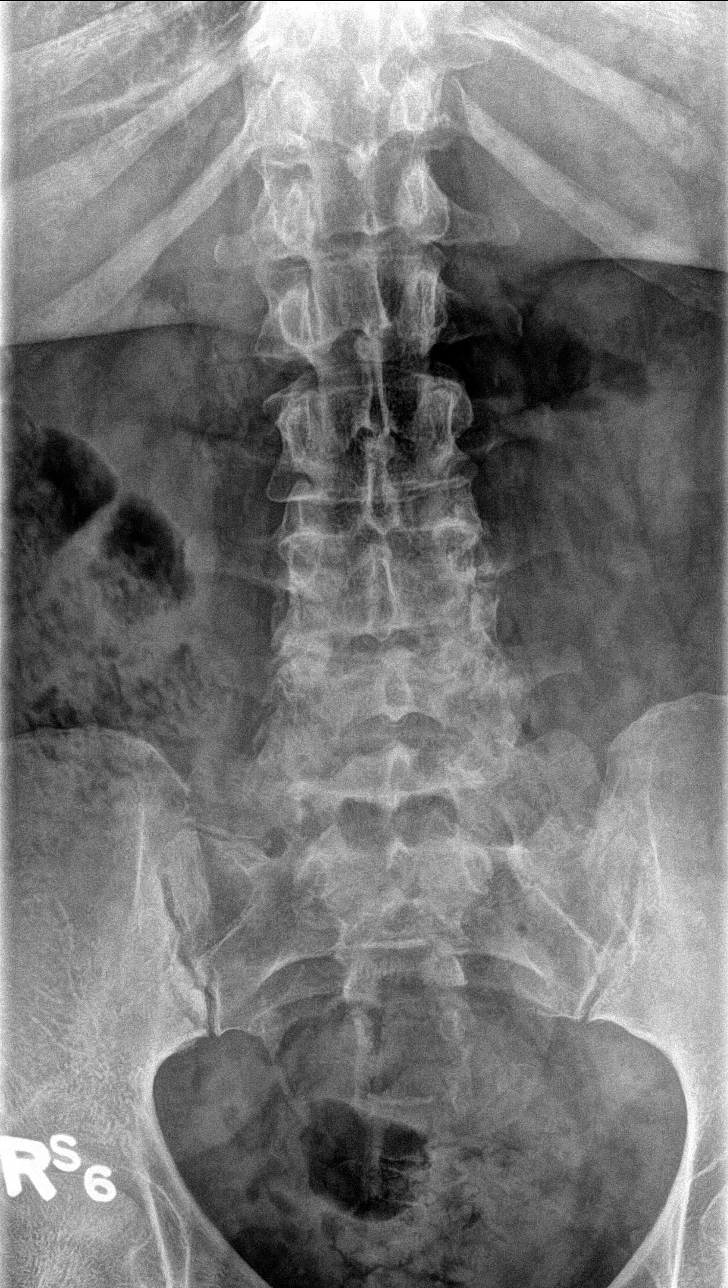
[im 2/5]
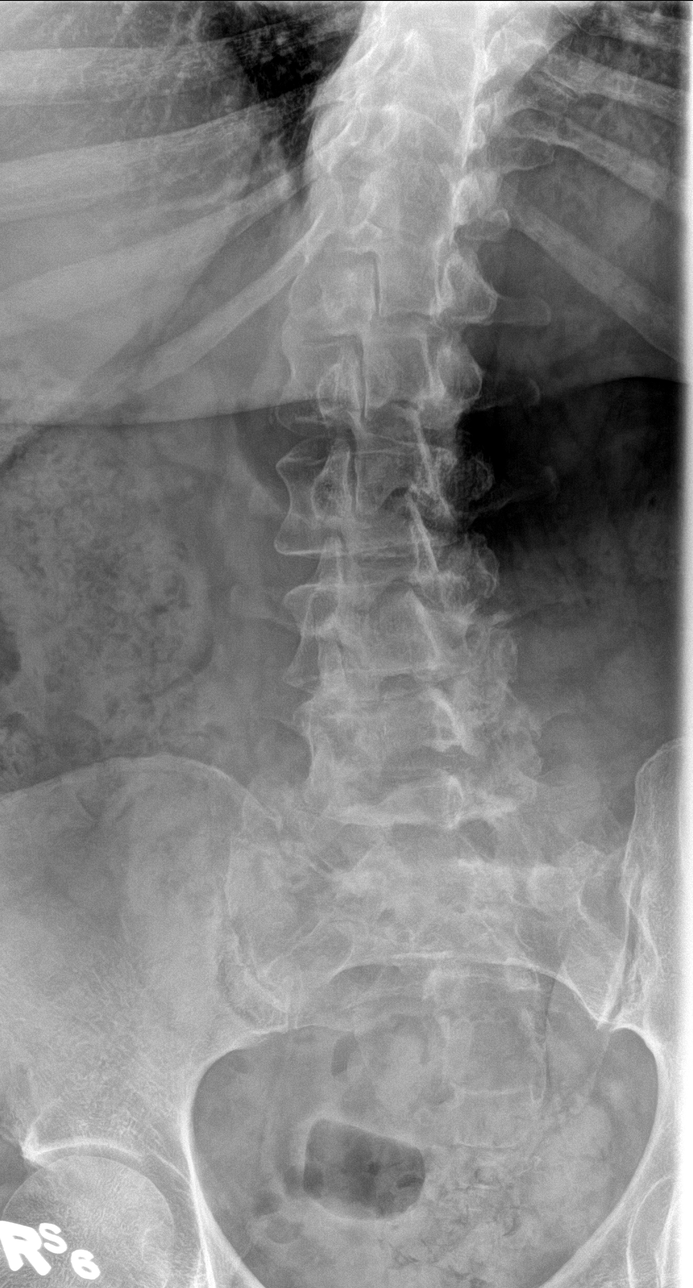
[im 3/5]
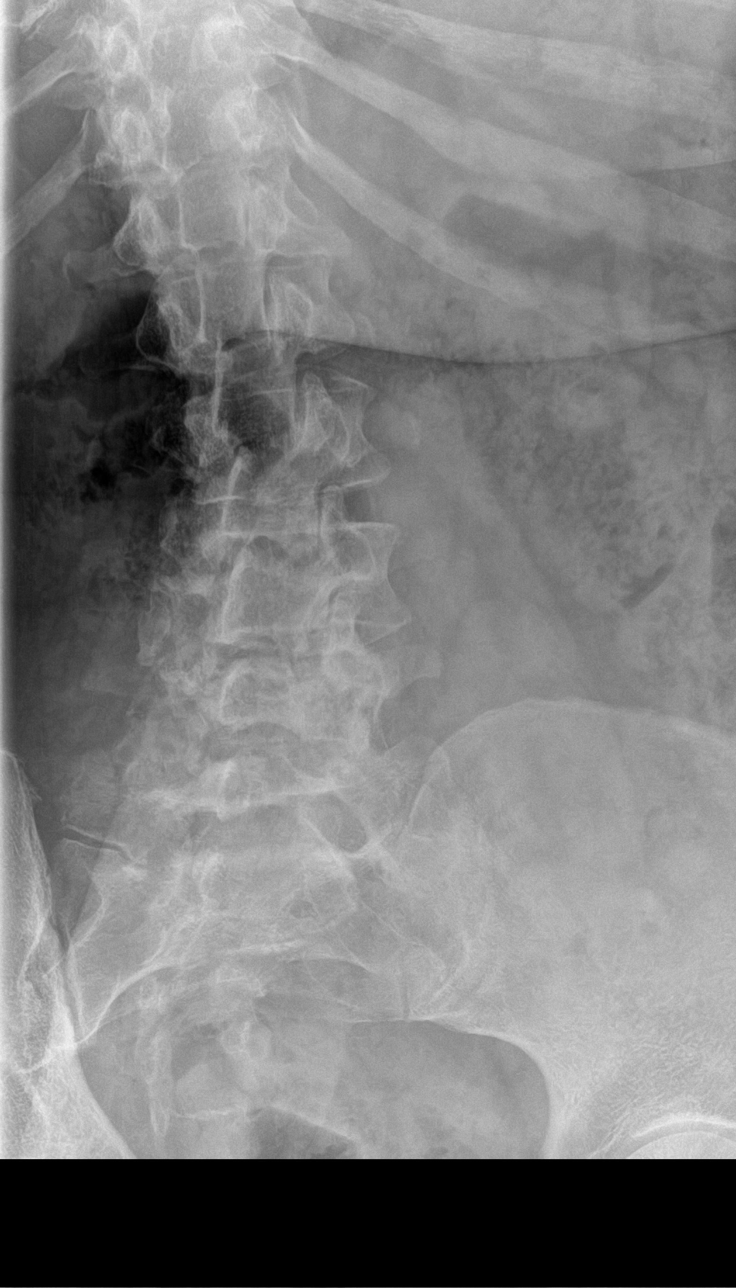
[im 4/5]
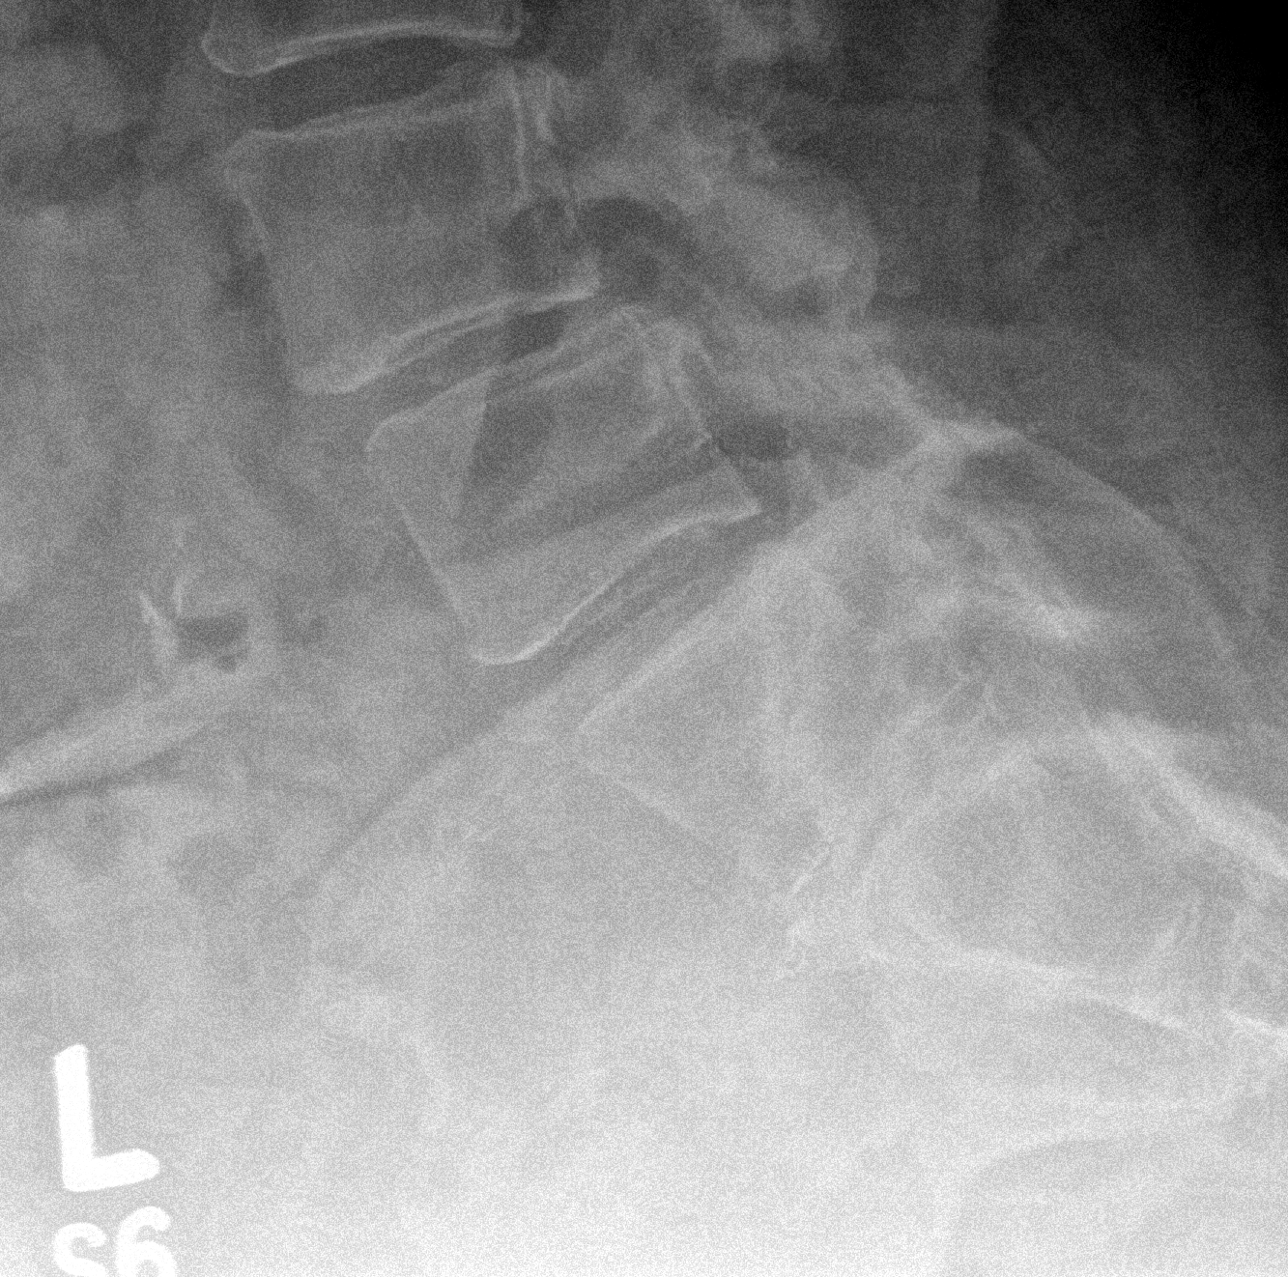
[im 5/5]
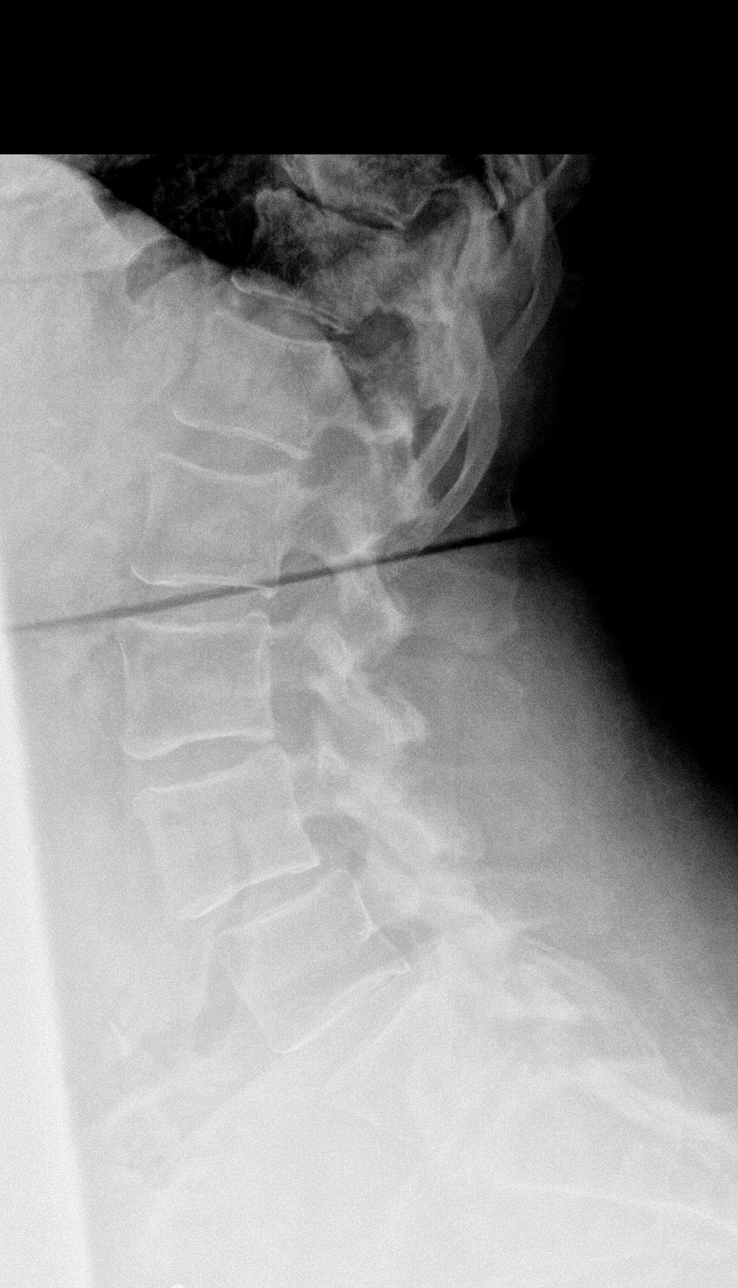

[5 of 5 positions shown; findings below may reference images not displayed]

FINDINGS: Examination demonstrates mild spondylosis of the lumbar spine with
moderate facet arthropathy over the mid to lower lumbar spine. There
is disc space narrowing at the L4-5 level. There is a subtle grade 1
anterolisthesis of L4 on L5 with slight interval progression. No
evidence of compression fracture.
IMPRESSION: Minimal spondylosis of the lumbar spine with disc disease at the
L4-5 level.

Grade 1 anterolisthesis of L4 on L5 with slight interval
progression.

## 2016-10-12 DIAGNOSIS — Z780 Asymptomatic menopausal state: Secondary | ICD-10-CM | POA: Insufficient documentation

## 2016-10-12 DIAGNOSIS — R7303 Prediabetes: Secondary | ICD-10-CM | POA: Insufficient documentation

## 2016-10-23 ENCOUNTER — Other Ambulatory Visit: Payer: Self-pay | Admitting: Obstetrics and Gynecology

## 2016-10-23 DIAGNOSIS — Z1231 Encounter for screening mammogram for malignant neoplasm of breast: Secondary | ICD-10-CM

## 2016-10-30 ENCOUNTER — Telehealth: Payer: Self-pay

## 2016-10-30 DIAGNOSIS — M545 Low back pain: Principal | ICD-10-CM

## 2016-10-30 DIAGNOSIS — G8929 Other chronic pain: Secondary | ICD-10-CM

## 2016-10-30 MED ORDER — LIDOCAINE 5 % EX PTCH: 1.0000 | MEDICATED_PATCH | CUTANEOUS | 0 refills | Status: DC

## 2016-10-30 NOTE — Telephone Encounter (Signed)
thx Prescribed

## 2016-10-30 NOTE — Telephone Encounter (Signed)
Spoke with pt and she stated that she uses them for both back and leg pain. She stated that she uses them a couple times a week and that Dr. Sharlet Salina prescribed them to her in the past but she does not see him anymore.

## 2016-10-30 NOTE — Telephone Encounter (Signed)
Received a refill request for lidocaine 5% patches. Do not see this in the pt's medication list.

## 2016-10-30 NOTE — Telephone Encounter (Signed)
Call pt  Need more info  What does she taken them for?  How often?  Who prescribed ?

## 2016-11-13 ENCOUNTER — Other Ambulatory Visit: Payer: Self-pay | Admitting: Family

## 2016-11-16 ENCOUNTER — Ambulatory Visit
Admission: RE | Admit: 2016-11-16 | Discharge: 2016-11-16 | Disposition: A | Discharge status: Home / Self Care | Payer: Managed Care, Other (non HMO) | Source: Ambulatory Visit | Attending: Obstetrics and Gynecology | Admitting: Obstetrics and Gynecology

## 2016-11-16 DIAGNOSIS — Z1231 Encounter for screening mammogram for malignant neoplasm of breast: Secondary | ICD-10-CM

## 2016-12-13 ENCOUNTER — Encounter: Payer: Self-pay | Admitting: Student in an Organized Health Care Education/Training Program

## 2016-12-13 ENCOUNTER — Ambulatory Visit
Payer: Managed Care, Other (non HMO) | Attending: Student in an Organized Health Care Education/Training Program | Admitting: Student in an Organized Health Care Education/Training Program

## 2016-12-13 VITALS — BP 138/64 | HR 83 | Temp 98.3°F | Resp 16 | Ht 61.0 in | Wt 240.0 lb

## 2016-12-13 DIAGNOSIS — E785 Hyperlipidemia, unspecified: Secondary | ICD-10-CM | POA: Insufficient documentation

## 2016-12-13 DIAGNOSIS — M4306 Spondylolysis, lumbar region: Secondary | ICD-10-CM

## 2016-12-13 DIAGNOSIS — M545 Low back pain: Secondary | ICD-10-CM

## 2016-12-13 DIAGNOSIS — G8929 Other chronic pain: Secondary | ICD-10-CM

## 2016-12-13 DIAGNOSIS — M47816 Spondylosis without myelopathy or radiculopathy, lumbar region: Secondary | ICD-10-CM

## 2016-12-13 DIAGNOSIS — M5440 Lumbago with sciatica, unspecified side: Secondary | ICD-10-CM | POA: Insufficient documentation

## 2016-12-13 DIAGNOSIS — Z9889 Other specified postprocedural states: Secondary | ICD-10-CM | POA: Diagnosis not present

## 2016-12-13 DIAGNOSIS — G894 Chronic pain syndrome: Secondary | ICD-10-CM

## 2016-12-13 DIAGNOSIS — I10 Essential (primary) hypertension: Secondary | ICD-10-CM | POA: Diagnosis not present

## 2016-12-13 DIAGNOSIS — M4696 Unspecified inflammatory spondylopathy, lumbar region: Secondary | ICD-10-CM | POA: Diagnosis not present

## 2016-12-13 DIAGNOSIS — M5136 Other intervertebral disc degeneration, lumbar region: Secondary | ICD-10-CM

## 2016-12-13 DIAGNOSIS — K5792 Diverticulitis of intestine, part unspecified, without perforation or abscess without bleeding: Secondary | ICD-10-CM | POA: Diagnosis not present

## 2016-12-13 DIAGNOSIS — M542 Cervicalgia: Secondary | ICD-10-CM

## 2016-12-13 DIAGNOSIS — I7789 Other specified disorders of arteries and arterioles: Secondary | ICD-10-CM | POA: Insufficient documentation

## 2016-12-13 DIAGNOSIS — M1288 Other specific arthropathies, not elsewhere classified, other specified site: Secondary | ICD-10-CM | POA: Diagnosis not present

## 2016-12-13 DIAGNOSIS — M51369 Other intervertebral disc degeneration, lumbar region without mention of lumbar back pain or lower extremity pain: Secondary | ICD-10-CM

## 2016-12-13 MED ORDER — DICLOFENAC SODIUM 1 % TD GEL: 2.0000 g | Freq: Four times a day (QID) | TRANSDERMAL | 2 refills | Status: DC

## 2016-12-13 MED ORDER — TIZANIDINE HCL 2 MG PO CAPS: ORAL_CAPSULE | ORAL | 2 refills | Status: DC

## 2016-12-13 MED ORDER — LIDOCAINE 5 % EX PTCH: 1.0000 | MEDICATED_PATCH | CUTANEOUS | 0 refills | Status: DC

## 2016-12-13 NOTE — Progress Notes (Signed)
Patient's Name: Terri Werner  MRN: [id number]  Referring Provider: Merri Ray, MD  DOB: 1950-06-03  PCP: Leotis Shames, MD  DOS: 12/13/2016  Note by: Edward Jolly, MD  Service setting: Ambulatory outpatient  Specialty: Interventional Pain Management  Location: [facility] (AMB) Pain Management Facility  Visit type: Initial Patient Evaluation  Patient type: New Patient   Primary Reason(s) for Visit: Encounter for initial evaluation of one or more chronic problems (new to examiner) potentially causing chronic pain, and posing a threat to normal musculoskeletal function. (Level of risk: High) CC: Back Pain (low) and Leg Pain (bilateral, anterior and posterior to knee)  HPI  [name] is a 66 y.o. year old, female patient, who comes today to see [place] for the first time for an initial evaluation of her chronic pain. She has HYPERTENSION, BENIGN; Screening for breast cancer; Arthralgia; Routine general medical examination at a health care facility; Hyperlipidemia; Carotid artery disorder (HCC); Diverticulitis of colon; and Lumbago on her problem list. Today she comes in for evaluation of her Back Pain (low) and Leg Pain (bilateral, anterior and posterior to knee)  Pain Assessment: Location: Lower Back Radiating:   Onset: More than a month ago Duration: Chronic pain Quality: Aching, Stabbing, Constant, Burning Severity: 8 /10 (self-reported pain score)  Note: Reported level is compatible with observation.                   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 Severity: NAS-11 at its worse: 10/10, NAS-11 at its best: 7/10, NAS-11 now: 9/10 and NAS-11 on the average: 8/10 Timing: Morning, Afternoon, During activity or exercise, After activity or exercise and After a period of  immobility Aggravating Factors: Bending, Kneeling, Lifiting, Motion, Prolonged sitting, Prolonged standing, Walking, Walking uphill and Walking downhill Alleviating Factors: Stretching, Cold packs, Hot packs and TENS Associated Problems: Constipation, Day-time cramps, Night-time cramps, Depression, Dizziness, Fatigue, Inability to concentrate, Numbness, Sadness, Tingling, Weakness and Pain that does not allow patient to sleep Quality of Pain: Aching, Annoying, Burning, Constant, Cramping, Exhausting, Nagging, Sharp, Stabbing, Tender, Throbbing, Tingling, Tiring and Uncomfortable Previous Examinations or Tests: Bone scan, MRI scan and Orthoperdic evaluation Previous Treatments: Chiropractic manipulations, Epidural steroid injections, Physical Therapy and TENS  The patient comes into the clinics today for the first time for a chronic pain management evaluation.   67 year old female with a past medical history significant for hypertension, arthralgias, hyperlipidemia, carotid artery disease who presents with low back pain as well as leg pain which is worsened over the last 2 years. Pain symptoms are worse on the right leg compared to the left leg. She does endorse some occasional numbness and tingling. She does also endorse a grabbing and throbbing type of pain in her low back which extends into her buttocks. No obvious lower extremity weakness or sensory deficits the patient has noted that she has to lean on certain things to help support her weight and minimize pain while she does recommend use of daily living. No red flags including bowel or bladder dysfunction.  Patient has seen Dr. Marcell Barlow with neurosurgery who offered her transforaminal lumbar discectomy and interbody fusion at L4-L5 and L5-S1 secondary to multiple anterolisthesis of L4 on L5 and L5 on S1. This was offered to her in September 2017. She has decided not to go forward  with surgery.  Regards to medications, patient has tried various  classes including opioid therapy which included Norco, tramadol, oxycodone were not very effective. Most recently she's been taking gabapentin 200 mg daily at bedtime, Mobic 15 mg daily, location of 5% lidocaine patches to her low back. Of note she did have a fall, mechanical, at home and had AED visit secondary to worsening low back pain on 11/11/2016. No fractures or significant injury was found on workup.  Today I took the time to provide the patient with information regarding my pain practice. The patient was informed that my practice is divided into two sections: an interventional pain management section, as well as a completely separate and distinct medication management section. I explained that I have procedure days for my interventional therapies, and evaluation days for follow-ups and medication management. Because of the amount of documentation required during both, they are kept separated. This means that there is the possibility that she may be scheduled for a procedure on one day, and medication management the next. I have also informed her that because of staffing and facility limitations, I no longer take patients for medication management only.   Because interventional pain management is my board-certified specialty, the patient was informed that joining my practice means that they are open to any and all interventional therapies. I made it clear that this does not mean that they will be forced to have any procedures done. What this means is that I believe interventional therapies to be essential part of the diagnosis and proper management of chronic pain conditions. Therefore, patients not interested in these interventional alternatives will be better served under the care of a different practitioner.  The patient was also made aware of my Comprehensive Pain Management Safety Guidelines where by joining my practice, they limit all of their nerve blocks and joint injections to those done by  our practice, for as long as we are retained to manage their care.   Historic Controlled Substance Pharmacotherapy Review  PMP and historical list of controlled substances: Tramadol 50 mg, quantity 90, last fill 02-2016 Current opioid analgesics: None Highest recorded MME/day: 30 mg/day Medications: The patient did not bring the medication(s) to the appointment, as requested in our "New Patient Package" Pharmacodynamics: Desired effects: Analgesia: The patient reports >50% benefit. Reported improvement in function: The patient reports medication allows her to accomplish basic ADLs. Clinically meaningful improvement in function (CMIF): Sustained CMIF goals met Perceived effectiveness: Described as relatively effective, allowing for increase in activities of daily living (ADL) Undesirable effects: Side-effects or Adverse reactions: None reported Historical Monitoring: The patient  reports that she does not use drugs. List of all UDS Test(s): No results found for: MDMA, COCAINSCRNUR, PCPSCRNUR, PCPQUANT, CANNABQUANT, THCU, ETH List of all Serum Drug Screening Test(s):  No results found for: AMPHSCRSER, BARBSCRSER, BENZOSCRSER, COCAINSCRSER, PCPSCRSER, PCPQUANT, THCSCRSER, CANNABQUANT, OPIATESCRSER, OXYSCRSER, PROPOXSCRSER Historical Background Evaluation: [place] PDMP: Six (6) year initial data search conducted.             [place] Department of public safety, offender search: Engineer, mining Information) Non-contributory Risk Assessment Profile: Aberrant behavior: None observed or detected today Risk factors for fatal opioid overdose: None identified today Fatal overdose hazard ratio (HR): Calculation deferred Non-fatal overdose hazard ratio (HR): Calculation deferred Risk of opioid abuse or dependence: 0.7-3.0% with doses ? 36 MME/day and 6.1-26% with doses ? 120 MME/day. Substance use disorder (SUD) risk level: Pending results of Medical Psychology Evaluation for SUD Opioid risk tool (ORT) (Total  Score):  0  ORT Scoring interpretation table:  Score <3 = Low Risk for SUD  Score between 4-7 = Moderate Risk for SUD  Score >8 = High Risk for Opioid Abuse     Pharmacologic Plan: Pending ordered tests and/or consults            Initial impression: Pending review of available data and ordered tests.  Meds   Current Meds  Medication Sig  . amLODipine (NORVASC) 10 MG tablet take 1 tablet by mouth once daily  . diclofenac sodium (VOLTAREN) 1 % GEL Apply 2 g topically 4 (four) times daily. Uses a couple time a week  . gabapentin (NEURONTIN) 100 MG capsule Take 200 mg by mouth at bedtime.  Marland Kitchen ibuprofen (ADVIL,MOTRIN) 200 MG tablet Take 400 mg by mouth daily.  Marland Kitchen lidocaine (LIDODERM) 5 % Place 1 patch onto the skin daily. Remove & Discard patch within 12 hours.  . meloxicam (MOBIC) 15 MG tablet Take 1 tablet (15 mg total) by mouth daily as needed for pain.  . naproxen sodium (ANAPROX) 220 MG tablet Take 440 mg by mouth daily as needed.  . [DISCONTINUED] diclofenac sodium (VOLTAREN) 1 % GEL Apply 2 g topically as needed. Uses a couple time a week  . [DISCONTINUED] lidocaine (LIDODERM) 5 % Place 1 patch onto the skin daily. Remove & Discard patch within 12 hours.    Imaging Review  Cervical Imaging: Cervical MR wo contrast:  Results for orders placed during the hospital encounter of 02/07/16  MR Cervical Spine Wo Contrast   Narrative CLINICAL DATA:  66 year old female with neck stiffness, hand swelling. Abnormal reflexes. Initial encounter.  EXAM: MRI CERVICAL SPINE WITHOUT CONTRAST  TECHNIQUE: Multiplanar, multisequence MR imaging of the cervical spine was performed. No intravenous contrast was administered.  COMPARISON:  Cervical spine radiographs 05/20/2014. Neck CT 02/05/2008.  FINDINGS: Alignment: Stable cervical lordosis.  Vertebrae: No marrow edema or evidence of acute osseous abnormality.  Cord: Spinal cord signal is within normal limits at all  visualized levels.  Posterior Fossa, vertebral arteries, paraspinal tissues: Cervicomedullary junction is within normal limits. Negative visualized posterior [place] negative neck soft tissues. Preserved major vascular flow voids.  Disc levels:  C2-C3:  Mild uncovertebral hypertrophy.  No stenosis.  C3-C4: Mild facet and uncovertebral hypertrophy greater on the left. Mild left greater than right C4 foraminal stenosis.  C4-C5: Moderate to severe left facet hypertrophy. Mild right facet hypertrophy. Mild uncovertebral hypertrophy. Mild ligament flavum hypertrophy. No spinal stenosis. Moderate left C5 foraminal stenosis.  C5-C6: Circumferential disc bulge. Superimposed small central disc protrusion (series 6, image 17). Moderate facet hypertrophy greater on the left. Mild ligament flavum hypertrophy. Borderline to mild spinal stenosis. No significant foraminal stenosis.  C6-C7: Trace anterolisthesis. Moderate facet hypertrophy on the left. Mild ligament flavum hypertrophy. Mild left C7 foraminal stenosis.  C7-T1:  Negative.  No upper thoracic spinal stenosis.  IMPRESSION: 1.  No acute osseous abnormality. 2. Dominant cervical degenerative finding is facet arthropathy, which in general is greater on the left. This contributes to up to moderate neural foraminal stenosis at the left C5 nerve level. 3. Comparatively mild cervical disc degeneration, most pronounced at C5-C6. Mild spinal stenosis at that level with no spinal cord mass effect.   Electronically Signed   By: Odessa Fleming M.D.   On: 02/07/2016 14:50     Cervical DG complete:  Results for orders placed during the hospital encounter of 05/20/14  DG Cervical Spine Complete   Narrative CLINICAL DATA:  Right-sided neck  and shoulder pain for 3 months, initial encounter, no known injury  EXAM: CERVICAL SPINE  4+ VIEWS  COMPARISON:  None.  FINDINGS: Seven cervical segments are well visualized. Vertebral body  height is well maintained. Mild osteophytic changes and facet hypertrophic changes are seen. The odontoid is within normal limits. Very mild neural foraminal narrowing is noted on the left at C4-5 and to a lesser degree at C6-7.  IMPRESSION: Mild degenerative change without acute abnormality.   Electronically Signed   By: Alcide Clever M.D.   On: 05/20/2014 11:25      Shoulder-R DG:  Results for orders placed during the hospital encounter of 05/20/14  DG Shoulder Right   Narrative CLINICAL DATA:  Right shoulder pain for 3 months, no known injury, initial encounter  EXAM: RIGHT SHOULDER - 2+ VIEW  COMPARISON:  None.  FINDINGS: There is no evidence of fracture or dislocation. There is no evidence of arthropathy or other focal bone abnormality. Soft tissues are unremarkable.  IMPRESSION: No acute abnormality noted.   Electronically Signed   By: Alcide Clever M.D.   On: 05/20/2014 11:26     Thoracic DG 2-3 views:  Results for orders placed in visit on 11/15/09  DG Thoracic Spine 2 View   Narrative PRIOR REPORT IMPORTED FROM THE [facility] WORKFLOW SYSTEM   REASON FOR EXAM:    back pain  COMMENTS:   PROCEDURE:     DXR - DXR THORACIC  AP AND LATERAL  - Nov 15 2009 11:51AM   RESULT:     Dextroscoliosis is identified within the thoracic spine. There  is no evidence of acute fracture or dislocation. Degenerative disc disease  changes are identified within the upper thoracic spine as well as at the  T11-T12 level.   IMPRESSION:   No evidence of acute osseous abnormalities.   Thank you for the opportunity to contribute to the care of your patient.        Lumbosacral Imaging: Lumbar MR wo contrast:  Results for orders placed during the hospital encounter of 09/07/15  MR Lumbar Spine Wo Contrast   Narrative CLINICAL DATA:  Low back pain for 1.5 years but worsening over the past 6 weeks, radiating down the right leg.  EXAM: MRI LUMBAR SPINE WITHOUT  CONTRAST  TECHNIQUE: Multiplanar, multisequence MR imaging of the lumbar spine was performed. No intravenous contrast was administered.  COMPARISON:  08/04/2015  FINDINGS: Review of prior chest and lumbar radiographs reveals 12 pairs of ribs and 6 lumbar type non-rib-bearing vertebra, the lowest of which is partially segmental. Accordingly, the transitional lumbosacral vertebra is assumed to represent the S1 level. Careful correlation with this numbering strategy prior to any procedural intervention would be recommended.  Please note that the axial images arm is registered with the sagittal T2 and T1 weighted images but are registered reasonably well with the sagittal STIR images. In order to prevent confusion, I went ahead and labeled the axial T2 intervertebral disc levels.  Multilevel degenerative subluxations include 3 mm retrolisthesis at T11-12, L1- 2, and L2- 3; 3 mm degenerative anterolisthesis at L3-4; 6 mm anterolisthesis at L4-5; and 3 mm anterolisthesis at L5-S 1. Intervertebral disc desiccation is observed at all levels in the lumbar spine.  Degenerative facet arthropathy with associated edema noted at the L5-S1 level bilaterally, with bilateral facet joint effusions.  Mildly exaggerated lumbar lordosis. Additional findings at individual levels are as follows:  Disc bulges at T10-11 and T11-12 without impingement. Additional findings at individual levels  are as follows:  L1-2:  No impingement.  Mild disc bulge.  L2-3: No impingement. Disc bulge and mild bilateral facet arthropathy.  L3-4: Mild right and borderline left subarticular lateral recess stenosis, borderline bilateral foraminal stenosis, and mild displacement of the right L3 nerve in the lateral extraforaminal space due to disc bulge, right lateral extraforaminal disc protrusion, and left greater than right facet arthropathy. Borderline central narrowing of the thecal sac.  L4-5: Mild central  narrowing of the thecal sac with mild bilateral subarticular lateral recess stenosis as well as mild bilateral foraminal stenosis due to disc bulge and facet arthropathy. There is mild displacement of both L4 nerves in the lateral extraforaminal space.  L5-S1: Moderate left and mild right foraminal stenosis with mild bilateral subarticular lateral recess stenosis and moderate central narrowing of the thecal sac due to disc bulge, intervertebral spurring, and facet spurring. Bilateral facet joint effusions.  S1-2:  No impingement.  IMPRESSION: 1. Transitional lumbosacral S1 vertebra. 2. Lumbar spondylosis and degenerative disc disease causing moderate impingement at L5-S1 and mild impingement at L3-4 and L4-5, as detailed above. 3. Please take special note that the axial images are mis-registered with the sagittal T2 and T1 weighted images. I went ahead and labeled the axial T2 weighted images at each disc space in order to clarify the actual levels.   Electronically Signed   By: Gaylyn Rong M.D.   On: 09/07/2015 09:29     Lumbar DG 2-3 views:  Results for orders placed in visit on 11/15/09  DG Lumbar Spine 2-3 Views   Narrative  PRIOR REPORT IMPORTED FROM AN EXTERNAL SYSTEM *  PRIOR REPORT IMPORTED FROM THE [facility] WORKFLOW SYSTEM   REASON FOR EXAM:    back pain  COMMENTS:   PROCEDURE:     DXR - DXR LUMBAR SPINE AP AND LATERAL  - Nov 15 2009  12:09PM   RESULT:     Comparison is made to a prior study dated 06/30/2007.   There is no evidence of fracture, dislocation or malalignment.  Osteoarthritic changes are appreciated at the L5-S1 level and most severe  at  the S1, S2 level. There is no evidence of acute fracture or dislocation.   IMPRESSION:   Degenerative disease changes without evidence of acute osseous  abnormalities.   Thank you for the opportunity to contribute to the care of your patient.       Lumbar DG (Complete) 4+V:  Results for orders  placed during the hospital encounter of 08/04/15  DG Lumbar Spine Complete   Narrative CLINICAL DATA:  Chronic low back pain worse over the past month. No injury. Pain radiates to right lower extremity.  EXAM: LUMBAR SPINE - COMPLETE 4+ VIEW  COMPARISON:  11/15/2009 and CT 06/22/2014  FINDINGS: Examination demonstrates mild spondylosis of the lumbar spine with moderate facet arthropathy over the mid to lower lumbar spine. There is disc space narrowing at the L4-5 level. There is a subtle grade 1 anterolisthesis of L4 on L5 with slight interval progression. No evidence of compression fracture.  IMPRESSION: Minimal spondylosis of the lumbar spine with disc disease at the L4-5 level.  Grade 1 anterolisthesis of L4 on L5 with slight interval progression.   Electronically Signed   By: Elberta Fortis M.D.   On: 08/04/2015 17:37     Knee Imaging: Knee-R MR w contrast: No results found for this or any previous visit. Knee-L MR w contrast:  Results for orders placed in visit on 05/29/08  MR Knee Left  Wo Contrast   Narrative   PRIOR REPORT IMPORTED FROM THE [facility] WORKFLOW SYSTEM   REASON FOR EXAM:    Left Knee Pain Eval MeniscusTear  COMMENTS:   PROCEDURE:     MR  - MR KNEE LT  WO CONTRAST  - May 29 2008  8:45AM   RESULT:     Multiplanar/multisequence imaging of the LEFT knee was  obtained  without the administration of gadolinium.   Evaluation of the patella demonstrate an area of increased T2 signal  within  the subchondral osseous portions of the patella extending from the  superior  medial periphery of the patella to the inferior medial aspect. There is  destruction of the cartilage along the superior medial portion and diffuse  thinning of the remaining subpatellar cartilage. There does not appear to  be  definite indentation and these findings are consistent with severe  chondromalacia patella. An element of degenerative cystic change is also a  diagnostic  consideration particularly in the areas where the overlying  cartilage is maintained. A subpatella effusion is identified which appears  to be moderate to large in size with extension into the medial and lateral  compartments. The neurovascular bundle appears intact. Fluid is  appreciated  within the popliteal fossa. The remaining osseous structures demonstrate  increased T2 signal within the medial femoral condyle central and  anteriorly. An irregularly linearly oriented region of signal void  projects  along the subarticular portion of the femoral condyle. There does not  appear  to be indentation of the overlying cartilage nor osseous portion of the  condyle and these findings are consistent with an ostial defect with  intact  overlying cartilage. There is thinning of the femoral and tibial articular  cartilage though the architecture appears to be intact. Note; mild edema  is  appreciated involving the adjacent tibial plateau without MR evidence of a  definite fracture. Evaluation of the medial collateral ligament and  lateral  collateral ligamentous complex demonstrates intact architecture though  fluid  signal is appreciated deep and superficial to the ligaments. The  quadriceps  tendon and patellar ligaments are intact. Fluid signal is appreciated  coursing along the expected course of the anterior cruciate ligament and  intact fibers are identified. The femoral and tibial insertions appear to  be  intact. The quadriceps tendon and patellar ligaments are intact.  Evaluation  of the menisci demonstrates an area of increased T2 signal along the  posterior periphery of the medial meniscus demonstrating articular  communication. This is in the region of the femoral and tibial  abnormalities. Note; there is mild osteophytosis involving the osseous  structures.   IMPRESSION:  1.     Findings consistent with severe chondromalacia patella with likely  superimposed osteoarthritic  cyst formation.  2.     Ostial defect involving the medial femoral condyle posteriorly and  centrally without an overlying chondral component.  3.     Mild edema within the medial tibial plateau.  4.     Findings consistent with a meniscal tear involving the periphery of  the posterior horn of the medial meniscus.  5.     Meniscocapsular separation within the medial and lateral  compartments.  6.     Subpatella effusion.   Thank you for the opportunity to contribute to the care of your patient.       Knee-L DG 4 views:  Results for orders placed during the hospital encounter of 11/25/14  DG Knee Complete 4 Views Left   Narrative CLINICAL DATA:  Left knee pain for 1 month with worsening over the last 2 weeks, no known injury, history of torn meniscus  EXAM: LEFT KNEE - COMPLETE 4+ VIEW  COMPARISON:  None.  FINDINGS: Significant joint space narrowing is noted medially with subchondral sclerosis and osteophytic changes. No significant joint effusion is seen. Mild patellofemoral degenerative changes are noted as well.  IMPRESSION: Degenerative change without acute abnormality.   Electronically Signed   By: Alcide Clever M.D.   On: 11/25/2014 16:57     Complexity Note: Imaging results reviewed. Results discussed using Layman's terms.               ROS  Cardiovascular History: High blood pressure and Heart murmur Pulmonary or Respiratory History: No reported pulmonary signs or symptoms such as wheezing and difficulty taking a deep full breath (Asthma), difficulty blowing air out (Emphysema), coughing up mucus (Bronchitis), persistent dry cough, or temporary stoppage of breathing during sleep Neurological History: No reported neurological signs or symptoms such as seizures, abnormal skin sensations, urinary and/or fecal incontinence, being born with an abnormal open spine and/or a tethered spinal cord Review of Past Neurological Studies: No results found for this or any previous  visit. Psychological-Psychiatric History: No reported psychological or psychiatric signs or symptoms such as difficulty sleeping, anxiety, depression, delusions or hallucinations (schizophrenial), mood swings (bipolar disorders) or suicidal ideations or attempts Gastrointestinal History: Heartburn due to stomach pushing into lungs (Hiatal hernia) and Irregular, infrequent bowel movements (Constipation) Genitourinary History: No reported renal or genitourinary signs or symptoms such as difficulty voiding or producing urine, peeing blood, non-functioning kidney, kidney stones, difficulty emptying the bladder, difficulty controlling the flow of urine, or chronic kidney disease Hematological History: Weakness due to low blood hemoglobin or red blood cell count (Anemia) Endocrine History: No reported endocrine signs or symptoms such as high or low blood sugar, rapid heart rate due to high thyroid levels, obesity or weight gain due to slow thyroid or thyroid disease Rheumatologic History: Joint aches and or swelling due to excess weight (Osteoarthritis) Musculoskeletal History: Negative for myasthenia gravis, muscular dystrophy, multiple sclerosis or malignant hyperthermia Work History: Working full time  Allergies  Ms. Bratten is allergic to latex; lisinopril; and shrimp [shellfish allergy].  Laboratory Chemistry  Inflammation Markers (CRP: Acute Phase) (ESR: Chronic Phase) No results found for: CRP, ESRSEDRATE               Renal Function Markers Lab Results  Component Value Date   BUN 15 07/05/2014   CREATININE 0.89 07/05/2014   GFRAA >60 07/03/2012   GFRNONAA >60 07/03/2012                 Hepatic Function Markers Lab Results  Component Value Date   AST 25 07/05/2014   ALT 24 07/05/2014   ALBUMIN 3.8 07/05/2014   ALKPHOS 52 07/05/2014   HCVAB NEGATIVE 07/06/2015                 Electrolytes Lab Results  Component Value Date   NA 142 07/05/2014   K 4.4 07/05/2014   CL  106 07/05/2014   CALCIUM 9.4 07/05/2014                 Neuropathy Markers No results found for: [id number]               Bone Pathology Markers Lab Results  Component Value Date   ALKPHOS 52 07/05/2014  VD25OH 28.98 (L) 04/15/2014   CALCIUM 9.4 07/05/2014                 Coagulation Parameters Lab Results  Component Value Date   PLT 315.0 07/05/2014                 Cardiovascular Markers Lab Results  Component Value Date   HGB 12.3 07/05/2014   HCT 36.9 07/05/2014                 Note: Lab results reviewed.  PFSH  Drug: Ms. Prawdzik  reports that she does not use drugs. Alcohol:  reports that she drinks alcohol. Tobacco:  reports that she has never smoked. She has never used smokeless tobacco. Medical:  has a past medical history of Arthritis; Colon polyps (2010); Genital warts; GERD (gastroesophageal reflux disease); Heart murmur; Hepatitis B; History of blood transfusion; History of chicken pox; History of hiatal hernia; History of torn meniscus of right knee; HTN (hypertension); migraine headaches; Hypertension; and Hypopotassemia. Family: family history includes Alzheimer's disease in her mother; Birth defects in her maternal grandmother; Breast cancer in her sister; Cancer in her sister; Colon cancer in her maternal grandmother; Dementia in her mother; Hyperlipidemia in her maternal grandfather and unknown relative; Hypertension in her maternal grandfather and mother.  Past Surgical History:  Procedure Laterality Date  . ABDOMINAL HYSTERECTOMY    . APPENDECTOMY  1980  . CESAREAN SECTION    . COLONOSCOPY WITH PROPOFOL N/A 11/01/2014   Procedure: COLONOSCOPY WITH PROPOFOL;  Surgeon: Scot Jun, MD;  Location: [facility];  Service: Endoscopy;  Laterality: N/A;  . ECTOPIC PREGNANCY SURGERY    . HERNIA REPAIR    . KNEE ARTHROSCOPY    . laproscopic Left knee repair  1971  . TONSILLECTOMY    . TOTAL ABDOMINAL HYSTERECTOMY W/ BILATERAL  SALPINGOOPHORECTOMY  2006  . VESICOVAGINAL FISTULA CLOSURE W/ TAH     Active Ambulatory Problems    Diagnosis Date Noted  . HYPERTENSION, BENIGN 11/19/2009  . Screening for breast cancer 06/22/2011  . Arthralgia 06/22/2011  . Routine general medical examination at a health care facility 09/07/2013  . Hyperlipidemia 10/08/2013  . Carotid artery disorder (HCC) 04/27/2014  . Diverticulitis of colon 07/05/2014  . Lumbago 08/04/2015   Resolved Ambulatory Problems    Diagnosis Date Noted  . CHEST PAIN UNSPECIFIED 11/18/2009  . General medical examination 06/22/2011  . Atypical chest pain 07/03/2012  . Chest pain 09/07/2013  . Dyspnea 09/07/2013  . Other malaise and fatigue 09/07/2013  . Arm numbness 09/18/2013  . Neck pain on right side 04/15/2014  . Dizziness 04/15/2014  . Rotator cuff tear 05/20/2014  . Nonallopathic lesion of cervical region 06/09/2014  . Nonallopathic lesion of thoracic region 06/09/2014  . Nonallopathic lesion of lumbar region 06/09/2014  . Arthritis of left lower extremity 11/26/2014  . Low back pain 08/18/2015   Past Medical History:  Diagnosis Date  . Arthritis   . Colon polyps 2010  . Genital warts   . GERD (gastroesophageal reflux disease)   . Heart murmur   . Hepatitis B   . History of blood transfusion   . History of chicken pox   . History of hiatal hernia   . History of torn meniscus of right knee   . HTN (hypertension)   . Hx of migraine headaches   . Hypertension   . Hypopotassemia    Constitutional Exam  General appearance: Well nourished, well developed, and well hydrated.  In no apparent acute distress Vitals:   12/13/16 0809  BP: 138/64  Pulse: 83  Resp: 16  Temp: 98.3 F (36.8 C)  TempSrc: Oral  SpO2: 100%  Weight: 240 lb (108.9 kg)  Height: 5\' 1"  (1.549 m)   BMI Assessment: Estimated body mass index is 45.35 kg/m as calculated from the following:   Height as of this encounter: 5\' 1"  (1.549 m).   Weight as of this  encounter: 240 lb (108.9 kg).  BMI interpretation table: BMI level Category Range association with higher incidence of chronic pain  <18 kg/m2 Underweight   18.5-24.9 kg/m2 Ideal body weight   25-29.9 kg/m2 Overweight Increased incidence by 20%  30-34.9 kg/m2 Obese (Class I) Increased incidence by 68%  35-39.9 kg/m2 Severe obesity (Class II) Increased incidence by 136%  >40 kg/m2 Extreme obesity (Class III) Increased incidence by 254%   BMI Readings from Last 4 Encounters:  12/13/16 45.35 kg/m  03/12/16 47.01 kg/m  12/07/15 46.07 kg/m  10/25/15 43.51 kg/m   Wt Readings from Last 4 Encounters:  12/13/16 240 lb (108.9 kg)  03/12/16 248 lb 12.8 oz (112.9 kg)  12/07/15 243 lb 12.8 oz (110.6 kg)  10/25/15 245 lb 9.6 oz (111.4 kg)  Psych/Mental status: Alert, oriented x 3 (person, place, & time)       Eyes: PERLA Respiratory: No evidence of acute respiratory distress  Cervical Spine Exam  Inspection: No masses, redness, or swelling Alignment: Symmetrical Functional ROM: Unrestricted ROM      Stability: No instability detected Muscle strength & Tone: Functionally intact Sensory: Unimpaired Palpation: No palpable anomalies              Upper Extremity (UE) Exam    Side: Right upper extremity  Side: Left upper extremity  Inspection: No masses, redness, swelling, or asymmetry. No contractures  Inspection: No masses, redness, swelling, or asymmetry. No contractures  Functional ROM: Unrestricted ROM          Functional ROM: Unrestricted ROM          Muscle strength & Tone: Functionally intact  Muscle strength & Tone: Functionally intact  Sensory: Unimpaired  Sensory: Unimpaired  Palpation: No palpable anomalies              Palpation: No palpable anomalies              Specialized Test(s): Deferred         Specialized Test(s): Deferred          Thoracic Spine Exam  Inspection: No masses, redness, or swelling Alignment: Symmetrical Functional ROM: Unrestricted ROM Stability: No  instability detected Sensory: Unimpaired Muscle strength & Tone: No palpable anomalies  Lumbar Spine Exam  Inspection: No masses, redness, or swelling Alignment: Symmetrical Functional ROM: Decreased ROM      Stability: No instability detected Muscle strength & Tone: Functionally intact Sensory: Movement-associated discomfort Palpation: Complains of area being tender to palpation       Provocative Tests: Lumbar Hyperextension and rotation test: Positive      pain radiation to posterior thighs up to knees Lumbar Lateral bending test: Positive       Patrick's Maneuver: evaluation deferred today                    Gait & Posture Assessment  Ambulation: Unassisted Gait: Relatively normal for age and body habitus Posture: WNL   Lower Extremity Exam    Side: Right lower extremity  Side: Left lower extremity  Inspection: No  masses, redness, swelling, or asymmetry. No contractures  Inspection: No masses, redness, swelling, or asymmetry. No contractures  Functional ROM: Unrestricted ROM          Functional ROM: Unrestricted ROM          Muscle strength & Tone: Functionally intact  Muscle strength & Tone: Functionally intact  Sensory: Unimpaired  Sensory: Unimpaired  Palpation: No palpable anomalies  Palpation: No palpable anomalies   Assessment  Primary Diagnosis & Pertinent Problem List: The primary encounter diagnosis was Lumbar spondylolysis. Diagnoses of Lumbar degenerative disc disease, Lumbar facet arthropathy (HCC), Cervicalgia, S/P left knee surgery, Chronic low back pain, unspecified back pain laterality, with sciatica presence unspecified, and Chronic pain syndrome were also pertinent to this visit.  Visit Diagnosis (New problems to examiner): 1. Lumbar spondylolysis   2. Lumbar degenerative disc disease   3. Lumbar facet arthropathy (HCC)   4. Cervicalgia   5. S/P left knee surgery   6. Chronic low back pain, unspecified back pain laterality, with sciatica presence  unspecified   7. Chronic pain syndrome    Plan of Care (Initial workup plan)  Note: Please be advised that as per protocol, today's visit has been an evaluation only. We have not taken over the patient's controlled substance management.  66 year old female with a past medical history significant for hypertension, arthralgias, hyperlipidemia, carotid artery disease who presents with low back pain as well as leg pain which is worsened over the last 2 years. Patient has seen Dr. Marcell Barlow with neurosurgery who offered her transforaminal lumbar discectomy and interbody fusion at L4-L5 and L5-S1 secondary to multiple anterolisthesis of L4 on L5 and L5 on S1. This was offered to her in September 2017. She has decided not to go forward with surgery.   Patient's lumbar MRI shows lumbar spondylosis and degenerative disc disease causing moderate impingement at L5-S1 and mild impingement at L3-4 and L4-5. The patient has tried epidural steroid injections in the past which were not very effective for her pain (series of 3, TF-ESI L5/S1- last one 6/17). Given her lumbar spondylosis and physical exam findings of pain with facet loading and lateral rotation, I think it would be reasonable to pursue lumbar medial branch blocks, diagnostic, with goal radiofrequency ablation since she has never done these and her pain has gotten worse. Patient is in agreement with this plan and will like to proceed.  Patient also endorses neck pain, weakness in her hands occasionally. This was evaluated by Dr. Marcell Barlow in a cervical MRI was done which does not show any significant cervical stenosis.  The patient does drive daily for work. She prefers not systemic medications to reduce the risk of drowsiness or sedation. She has been using Voltaren gel as well as lidocaine patches which have been somewhat effective. I will provide her with a refill for both of these topical agents. We also discussed utilizing a muscle relaxant for her  shoulder and low back muscle cramps as needed. She can continue her gabapentin 200 mg daily at bedtime.  Plan: #1. Lumbar medial branch nerve blocks, L3-L5, bilaterally, with goal radiofrequency ablation #2. Continue Mobic, gabapentin, Voltaren gel, lidocaine patches. Prescription provided for Voltaren gel and lidocaine patches. #3. Start tizanidine, 2 or 4 mg twice a day when necessary for muscle spasms in the cervical region and lumbar region. #4. Follow up with me for next available appointment for diagnostic bilateral lumbar, L3-L5, medial branch nerve blocks. #5. In regards to opioid therapy, the patient is tried oxycodone,  tramadol, hydrocodone in the past which were not very effective. She is not interested in restarting opioid therapy given her job and does not want to be sedated or drowsy. She also has issues with constipation and utilizes a stool softener. Anyway I'll obtain a urine drug screen today which should be negative for any illicit substances.    Ordered Lab-work, Procedure(s), Referral(s), & Consult(s): Orders Placed This Encounter  Procedures  . LUMBAR FACET(MEDIAL BRANCH NERVE BLOCK) MBNB  . Compliance Drug Analysis, Ur   Pharmacotherapy (current): Medications ordered:  Meds ordered this encounter  Medications  . diclofenac sodium (VOLTAREN) 1 % GEL    Sig: Apply 2 g topically 4 (four) times daily. Uses a couple time a week    Dispense:  100 g    Refill:  2  . lidocaine (LIDODERM) 5 %    Sig: Place 1 patch onto the skin daily. Remove & Discard patch within 12 hours.    Dispense:  30 patch    Refill:  0  . tizanidine (ZANAFLEX) 2 MG capsule    Sig: 2-4 mg BID prn spams    Dispense:  60 capsule    Refill:  2   Medications administered during this visit: Ms. Oxley had no medications administered during this visit.   Pharmacological management options:  Opioid Analgesics: The patient was informed that there is no guarantee that she would be a  candidate for opioid analgesics. The decision will be made following CDC guidelines. This decision will be based on the results of diagnostic studies, as well as Ms. Sybesma risk profile.   Membrane stabilizer: Currently on gabapentin 200 mg daily at bedtime. Higher doses made her "crazy".  Muscle relaxant: Has tried Flexeril in the past which made her sedated. We'll try tizanidine at this visit, 2-4 mg twice a day when necessary  NSAID: Currently taking Mobic 15 mg daily.  Other analgesic(s): Utilizing Voltaren gel, 5% lidocaine ointment. Has tried physical therapy, trigger point injections, dry needling in past   Interventional management options: Ms. Waddy was informed that there is no guarantee that she would be a candidate for interventional therapies. The decision will be based on the results of diagnostic studies, as well as Ms. Lineback risk profile.  Procedure(s) under consideration:  -Diagnostic bilateral L3-L5 lumbar medial branch nerve blocks. -Trigger point injections. -Genicular nerve blocks. -Cervical medial branch nerve blocks, bilateral C4 C5 C6 -Spinal cord stimulator trial    Provider-requested follow-up: Return for Procedure.  Future Appointments Date Time Provider Department Center  02/07/2018 1:00 PM [facility] 3 [facility] None  02/07/2018 2:00 PM Dew, Marlow Baars, MD [facility] None    Primary Care Physician: Leotis Shames, MD Location: [facility] Note by: Edward Jolly, M.D, Date: 12/13/2016; Time: 9:18 AM  Patient Instructions   He was very nice meeting you today. Today we discussed the following:    #1. Continue your medications as currently prescribed. I have provided you with a refill for Voltaren gel, lidocaine patches. It is reasonable to apply heating pad to her back to relax her lumbar paraspinal muscles before application of the Voltaren gel.  #2. I will also prescribe you a muscle relaxant call  tizanidine. You can take this 2 or 4 mg up to twice a day as needed for muscle spasms or back pain.  #3. As we discussed, we will schedule you for lumbar medial branch nerve blocks on both sides, right and left. These are diagnostic blocks. If you obtain  significant pain relief from our 2 diagnostic blocks, we will proceed with radiofrequency ablation during which we burn the nerves that send information from the facet joints.  #4. Follow-up for my next available procedural day in which we'll do your lumbar medial branch nerve blocks.    ____________________________________________________________________________________________  Appointment Policy Summary  It is our goal and responsibility to provide the medical community with assistance in the evaluation and management of patients with chronic pain. Unfortunately our resources are limited. Because we do not have an unlimited amount of time, or available appointments, we are required to closely monitor and manage their use. The following rules exist to maximize their use:  Patient's responsibilities: 1. Punctuality:  At what time should I arrive? You should be physically present in our office 30 minutes before your scheduled appointment. Your scheduled appointment is with your assigned healthcare provider. However, it takes 5-10 minutes to be "checked-in", and another 15 minutes for the nurses to do the admission. If you arrive to our office at the time you were given for your appointment, you will end up being at least 20-25 minutes late to your appointment with the provider. 2. Tardiness:  What happens if I arrive only a few minutes after my scheduled appointment time? You will need to reschedule your appointment. The cutoff is your appointment time. This is why it is so important that you arrive at least 30 minutes before that appointment. If you have an appointment scheduled for 10:00 AM and you arrive at 10:01, you will be required to  reschedule your appointment.  3. Plan ahead:  Always assume that you will encounter traffic on your way in. Plan for it. If you are dependent on a driver, make sure they understand these rules and the need to arrive early. 4. Other appointments and responsibilities:  Avoid scheduling any other appointments before or after your pain clinic appointments.  5. Be prepared:  Write down everything that you need to discuss with your healthcare provider and give this information to the admitting nurse. Write down the medications that you will need refilled. Bring your pills and bottles (even the empty ones), to all of your appointments, except for those where a procedure is scheduled. 6. No children or pets:  Find someone to take care of them. It is not appropriate to bring them in. 7. Scheduling changes:  We request "advanced notification" of any changes or cancellations. 8. Advanced notification:  Defined as a time period of more than 24 hours prior to the originally scheduled appointment. This allows for the appointment to be offered to other patients. 9. Rescheduling:  When a visit is rescheduled, it will require the cancellation of the original appointment. For this reason they both fall within the category of "Cancellations".  10. Cancellations:  They require advanced notification. Any cancellation less than 24 hours before the  appointment will be recorded as a "No Show". 11. No Show:  Defined as an unkept appointment where the patient failed to notify or declare to the practice their intention or inability to keep the appointment.  Corrective process for repeat offenders:  1. Tardiness: Three (3) episodes of rescheduling due to late arrivals will be recorded as one (1) "No Show". 2. Cancellation or reschedule: Three (3) cancellations or rescheduling will be recorded as one (1) "No Show". 3. "No Shows": Three (3) "No Shows" within a 12 month period will result in discharge from the  practice.  ____________________________________________________________________________________________ ____________________________________________________________________________________________  Preparing for your procedure (without sedation)  Instructions: . Oral Intake: Do not eat or drink anything for at least 3 hours prior to your procedure. . Transportation: Unless otherwise stated by your physician, you may drive yourself after the procedure. . Blood Pressure Medicine: Take your blood pressure medicine with a sip of water the morning of the procedure. . Blood thinners:  . Diabetics on insulin: Notify the staff so that you can be scheduled 1st case in the morning. If your diabetes requires high dose insulin, take only  of your normal insulin dose the morning of the procedure and notify the staff that you have done so. . Preventing infections: Shower with an antibacterial soap the morning of your procedure.  . Build-up your immune system: Take 1000 mg of Vitamin C with every meal (3 times a day) the day prior to your procedure. Marland Kitchen Antibiotics: Inform the staff if you have a condition or reason that requires you to take antibiotics before dental procedures. . Pregnancy: If you are pregnant, call and cancel the procedure. . Sickness: If you have a cold, fever, or any active infections, call and cancel the procedure. . Arrival: You must be in the facility at least 30 minutes prior to your scheduled procedure. . Children: Do not bring any children with you. . Dress appropriately: Bring dark clothing that you would not mind if they get stained. . Valuables: Do not bring any jewelry or valuables. Procedure appointments are reserved for interventional treatments only. Marland Kitchen No Prescription Refills. . No medication changes will be discussed during procedure appointments. . No disability issues will be  discussed. ____________________________________________________________________________________________   Pain Management Discharge Instructions  General Discharge Instructions :  If you need to reach your doctor call: Monday-Friday 8:00 am - 4:00 pm at [phone] or toll free [phone].  After clinic hours [phone] to have operator reach doctor.  Bring all of your medication bottles to all your appointments in the pain clinic.  To cancel or reschedule your appointment with Pain Management please remember to call 24 hours in advance to avoid a fee.  Refer to the educational materials which you have been given on: General Risks, I had my Procedure. Discharge Instructions, Post Sedation.  Post Procedure Instructions:  The drugs you were given will stay in your system until tomorrow, so for the next 24 hours you should not drive, make any legal decisions or drink any alcoholic beverages.  You may eat anything you prefer, but it is better to start with liquids then soups and crackers, and gradually work up to solid foods.  Please notify your doctor immediately if you have any unusual bleeding, trouble breathing or pain that is not related to your normal pain.  Depending on the type of procedure that was done, some parts of your body may feel week and/or numb.  This usually clears up by tonight or the next day.  Walk with the use of an assistive device or accompanied by an adult for the 24 hours.  You may use ice on the affected area for the first 24 hours.  Put ice in a Ziploc bag and cover with a towel and place against area 15 minutes on 15 minutes off.  You may switch to heat after 24 hours.GENERAL RISKS AND COMPLICATIONS  What are the risk, side effects and possible complications? Generally speaking, most procedures are safe.  However, with any procedure there are risks, side effects, and the possibility of complications.  The risks and complications are dependent upon the  sites that are  lesioned, or the type of nerve block to be performed.  The closer the procedure is to the spine, the more serious the risks are.  Great care is taken when placing the radio frequency needles, block needles or lesioning probes, but sometimes complications can occur. 1. Infection: Any time there is an injection through the skin, there is a risk of infection.  This is why sterile conditions are used for these blocks.  There are four possible types of infection. 1. Localized skin infection. 2. Central Nervous System Infection-This can be in the form of Meningitis, which can be deadly. 3. Epidural Infections-This can be in the form of an epidural abscess, which can cause pressure inside of the spine, causing compression of the spinal cord with subsequent paralysis. This would require an emergency surgery to decompress, and there are no guarantees that the patient would recover from the paralysis. 4. Discitis-This is an infection of the intervertebral discs.  It occurs in about 1% of discography procedures.  It is difficult to treat and it may lead to surgery.        2. Pain: the needles have to go through skin and soft tissues, will cause soreness.       3. Damage to internal structures:  The nerves to be lesioned may be near blood vessels or    other nerves which can be potentially damaged.       4. Bleeding: Bleeding is more common if the patient is taking blood thinners such as  aspirin, Coumadin, Ticiid, Plavix, etc., or if he/she have some genetic predisposition  such as hemophilia. Bleeding into the spinal canal can cause compression of the spinal  cord with subsequent paralysis.  This would require an emergency surgery to  decompress and there are no guarantees that the patient would recover from the  paralysis.       5. Pneumothorax:  Puncturing of a lung is a possibility, every time a needle is introduced in  the area of the chest or upper back.  Pneumothorax refers to free air around  the  collapsed lung(s), inside of the thoracic cavity (chest cavity).  Another two possible  complications related to a similar event would include: Hemothorax and Chylothorax.   These are variations of the Pneumothorax, where instead of air around the collapsed  lung(s), you may have blood or chyle, respectively.       6. Spinal headaches: They may occur with any procedures in the area of the spine.       7. Persistent CSF (Cerebro-Spinal Fluid) leakage: This is a rare problem, but may occur  with prolonged intrathecal or epidural catheters either due to the formation of a fistulous  track or a dural tear.       8. Nerve damage: By working so close to the spinal cord, there is always a possibility of  nerve damage, which could be as serious as a permanent spinal cord injury with  paralysis.       9. Death:  Although rare, severe deadly allergic reactions known as "Anaphylactic  reaction" can occur to any of the medications used.      10. Worsening of the symptoms:  We can always make thing worse.  What are the chances of something like this happening? Chances of any of this occuring are extremely low.  By statistics, you have more of a chance of getting killed in a motor vehicle accident: while driving to the hospital than any of the above occurring .  Nevertheless, you should be aware that they are possibilities.  In general, it is similar to taking a shower.  Everybody knows that you can slip, hit your head and get killed.  Does that mean that you should not shower again?  Nevertheless always keep in mind that statistics do not mean anything if you happen to be on the wrong side of them.  Even if a procedure has a 1 (one) in a 1,000,000 (million) chance of going wrong, it you happen to be that one..Also, keep in mind that by statistics, you have more of a chance of having something go wrong when taking medications.  Who should not have this procedure? If you are on a blood thinning medication (e.g.  Coumadin, Plavix, see list of "Blood Thinners"), or if you have an active infection going on, you should not have the procedure.  If you are taking any blood thinners, please inform your physician.  How should I prepare for this procedure?  Do not eat or drink anything at least six hours prior to the procedure.  Bring a driver with you .  It cannot be a taxi.  Come accompanied by an adult that can drive you back, and that is strong enough to help you if your legs get weak or numb from the local anesthetic.  Take all of your medicines the morning of the procedure with just enough water to swallow them.  If you have diabetes, make sure that you are scheduled to have your procedure done first thing in the morning, whenever possible.  If you have diabetes, take only half of your insulin dose and notify our nurse that you have done so as soon as you arrive at the clinic.  If you are diabetic, but only take blood sugar pills (oral hypoglycemic), then do not take them on the morning of your procedure.  You may take them after you have had the procedure.  Do not take aspirin or any aspirin-containing medications, at least eleven (11) days prior to the procedure.  They may prolong bleeding.  Wear loose fitting clothing that may be easy to take off and that you would not mind if it got stained with Betadine or blood.  Do not wear any jewelry or perfume  Remove any nail coloring.  It will interfere with some of our monitoring equipment.  NOTE: Remember that this is not meant to be interpreted as a complete list of all possible complications.  Unforeseen problems may occur.  BLOOD THINNERS The following drugs contain aspirin or other products, which can cause increased bleeding during surgery and should not be taken for 2 weeks prior to and 1 week after surgery.  If you should need take something for relief of minor pain, you may take acetaminophen which is found in Tylenol,m Datril, Anacin-3 and  Panadol. It is not blood thinner. The products listed below are.  Do not take any of the products listed below in addition to any listed on your instruction sheet.  A.P.C or A.P.C with Codeine Codeine Phosphate Capsules #3 Ibuprofen Ridaura  ABC compound Congesprin Imuran rimadil  Advil Cope Indocin Robaxisal  Alka-Seltzer Effervescent Pain Reliever and Antacid Coricidin or Coricidin-D  Indomethacin Rufen  Alka-Seltzer plus Cold Medicine Cosprin Ketoprofen S-A-C Tablets  Anacin Analgesic Tablets or Capsules Coumadin Korlgesic Salflex  Anacin Extra Strength Analgesic tablets or capsules CP-2 Tablets Lanoril Salicylate  Anaprox Cuprimine Capsules Levenox Salocol  Anexsia-D Dalteparin Magan Salsalate  Anodynos Darvon compound Magnesium Salicylate Sine-off  Ansaid Dasin Capsules Magsal Sodium Salicylate  Anturane Depen Capsules Marnal Soma  APF Arthritis pain formula Dewitt's Pills Measurin Stanback  Argesic Dia-Gesic Meclofenamic Sulfinpyrazone  Arthritis Bayer Timed Release Aspirin Diclofenac Meclomen Sulindac  Arthritis pain formula Anacin Dicumarol Medipren Supac  Analgesic (Safety coated) Arthralgen Diffunasal Mefanamic Suprofen  Arthritis Strength Bufferin Dihydrocodeine Mepro Compound Suprol  Arthropan liquid Dopirydamole Methcarbomol with Aspirin Synalgos  ASA tablets/Enseals Disalcid Micrainin Tagament  Ascriptin Doan's Midol Talwin  Ascriptin A/D Dolene Mobidin Tanderil  Ascriptin Extra Strength Dolobid Moblgesic Ticlid  Ascriptin with Codeine Doloprin or Doloprin with Codeine Momentum Tolectin  Asperbuf Duoprin Mono-gesic Trendar  Aspergum Duradyne Motrin or Motrin IB Triminicin  Aspirin plain, buffered or enteric coated Durasal Myochrisine Trigesic  Aspirin Suppositories Easprin Nalfon Trillsate  Aspirin with Codeine Ecotrin Regular or Extra Strength Naprosyn Uracel  Atromid-S Efficin Naproxen Ursinus  Auranofin Capsules Elmiron Neocylate Vanquish  Axotal Emagrin Norgesic  Verin  Azathioprine Empirin or Empirin with Codeine Normiflo Vitamin E  Azolid Emprazil Nuprin Voltaren  Bayer Aspirin plain, buffered or children's or timed BC Tablets or powders Encaprin Orgaran Warfarin Sodium  Buff-a-Comp Enoxaparin Orudis Zorpin  Buff-a-Comp with Codeine Equegesic Os-Cal-Gesic   Buffaprin Excedrin plain, buffered or Extra Strength Oxalid   Bufferin Arthritis Strength Feldene Oxphenbutazone   Bufferin plain or Extra Strength Feldene Capsules Oxycodone with Aspirin   Bufferin with Codeine Fenoprofen Fenoprofen Pabalate or Pabalate-SF   Buffets II Flogesic Panagesic   Buffinol plain or Extra Strength Florinal or Florinal with Codeine Panwarfarin   Buf-Tabs Flurbiprofen Penicillamine   Butalbital Compound Four-way cold tablets Penicillin   Butazolidin Fragmin Pepto-Bismol   Carbenicillin Geminisyn Percodan   Carna Arthritis Reliever Geopen Persantine   Carprofen Gold's salt Persistin   Chloramphenicol Goody's Phenylbutazone   Chloromycetin Haltrain Piroxlcam   Clmetidine heparin Plaquenil   Cllnoril Hyco-pap Ponstel   Clofibrate Hydroxy chloroquine Propoxyphen         Before stopping any of these medications, be sure to consult the physician who ordered them.  Some, such as Coumadin (Warfarin) are ordered to prevent or treat serious conditions such as "deep thrombosis", "pumonary embolisms", and other heart problems.  The amount of time that you may need off of the medication may also vary with the medication and the reason for which you were taking it.  If you are taking any of these medications, please make sure you notify your pain physician before you undergo any procedures.          Facet Joint Block The facet joints connect the bones of the spine (vertebrae). They make it possible for you to bend, twist, and make other movements with your spine. They also keep you from bending too far, twisting too far, and making other excessive movements. A facet joint  block is a procedure where a numbing medicine (anesthetic) is injected into a facet joint. Often, a type of anti-inflammatory medicine called a steroid is also injected. A facet joint block may be done to diagnose neck or back pain. If the pain gets better after a facet joint block, it means the pain is probably coming from the facet joint. If the pain does not get better, it means the pain is probably not coming from the facet joint. A facet joint block may also be done to relieve neck or back pain caused by an inflamed facet joint. A facet joint block is only done to relieve pain if the pain does not improve with other methods, such as medicine, exercise  programs, and physical therapy. Tell a health care provider about:  Any allergies you have.  All medicines you are taking, including vitamins, herbs, eye drops, creams, and over-the-counter medicines.  Any problems you or family members have had with anesthetic medicines.  Any blood disorders you have.  Any surgeries you have had.  Any medical conditions you have.  Whether you are pregnant or may be pregnant. What are the risks? Generally, this is a safe procedure. However, problems may occur, including:  Bleeding.  Injury to a nerve near the injection site.  Pain at the injection site.  Weakness or numbness in areas controlled by nerves near the injection site.  Infection.  Temporary fluid retention.  Allergic reactions to medicines or dyes.  Injury to other structures or organs near the injection site.  What happens before the procedure?  Follow instructions from your health care provider about eating or drinking restrictions.  Ask your health care provider about: ? Changing or stopping your regular medicines. This is especially important if you are taking diabetes medicines or blood thinners. ? Taking medicines such as aspirin and ibuprofen. These medicines can thin your blood. Do not take these medicines before your  procedure if your health care provider instructs you not to.  Do not take any new dietary supplements or medicines without asking your health care provider first.  Plan to have someone take you home after the procedure. What happens during the procedure?  You may need to remove your clothing and dress in an open-back gown.  The procedure will be done while you are lying on an X-Werner table. You will most likely be asked to lie on your stomach, but you may be asked to lie in a different position if an injection will be made in your neck.  Machines will be used to monitor your oxygen levels, heart rate, and blood pressure.  If an injection will be made in your neck, an IV tube will be inserted into one of your veins. Fluids and medicine will flow directly into your body through the IV tube.  The area over the facet joint where the injection will be made will be cleaned with soap. The surrounding skin will be covered with clean drapes.  A numbing medicine (local anesthetic) will be applied to your skin. Your skin may sting or burn for a moment.  A video X-Werner machine (fluoroscopy) will be used to locate the joint. In some cases, a CT scan may be used.  A contrast dye may be injected into the facet joint area to help locate the joint.  When the joint is located, an anesthetic will be injected into the joint through the needle.  Your health care provider will ask you whether you feel pain relief. If you do feel relief, a steroid may be injected to provide pain relief for a longer period of time. If you do not feel relief or feel only partial relief, additional injections of an anesthetic may be made in other facet joints.  The needle will be removed.  Your skin will be cleaned.  A bandage (dressing) will be applied over each injection site. The procedure may vary among health care providers and hospitals. What happens after the procedure?  You will be observed for 15-30 minutes before  being allowed to go home. This information is not intended to replace advice given to you by your health care provider. Make sure you discuss any questions you have with your health care provider. Document  Released: 09/19/2006 Document Revised: 06/01/2015 Document Reviewed: 01/24/2015 [employer] Interactive Patient Education  [employer].

## 2016-12-13 NOTE — Patient Instructions (Addendum)
He was very nice meeting you today. Today we discussed the following:    #1. Continue your medications as currently prescribed. I have provided you with a refill for Voltaren gel, lidocaine patches. It is reasonable to apply heating pad to her back to relax her lumbar paraspinal muscles before application of the Voltaren gel.  #2. I will also prescribe you a muscle relaxant call tizanidine. You can take this 2 or 4 mg up to twice a day as needed for muscle spasms or back pain.  #3. As we discussed, we will schedule you for lumbar medial branch nerve blocks on both sides, right and left. These are diagnostic blocks. If you obtain significant pain relief from our 2 diagnostic blocks, we will proceed with radiofrequency ablation during which we burn the nerves that send information from the facet joints.  #4. Follow-up for my next available procedural day in which we'll do your lumbar medial branch nerve blocks.    ____________________________________________________________________________________________  Appointment Policy Summary  It is our goal and responsibility to provide the medical community with assistance in the evaluation and management of patients with chronic pain. Unfortunately our resources are limited. Because we do not have an unlimited amount of time, or available appointments, we are required to closely monitor and manage their use. The following rules exist to maximize their use:  Patient's responsibilities: 1. Punctuality:  At what time should I arrive? You should be physically present in our office 30 minutes before your scheduled appointment. Your scheduled appointment is with your assigned healthcare provider. However, it takes 5-10 minutes to be "checked-in", and another 15 minutes for the nurses to do the admission. If you arrive to our office at the time you were given for your appointment, you will end up being at least 20-25 minutes late to your appointment with the  provider. 2. Tardiness:  What happens if I arrive only a few minutes after my scheduled appointment time? You will need to reschedule your appointment. The cutoff is your appointment time. This is why it is so important that you arrive at least 30 minutes before that appointment. If you have an appointment scheduled for 10:00 AM and you arrive at 10:01, you will be required to reschedule your appointment.  3. Plan ahead:  Always assume that you will encounter traffic on your way in. Plan for it. If you are dependent on a driver, make sure they understand these rules and the need to arrive early. 4. Other appointments and responsibilities:  Avoid scheduling any other appointments before or after your pain clinic appointments.  5. Be prepared:  Write down everything that you need to discuss with your healthcare provider and give this information to the admitting nurse. Write down the medications that you will need refilled. Bring your pills and bottles (even the empty ones), to all of your appointments, except for those where a procedure is scheduled. 6. No children or pets:  Find someone to take care of them. It is not appropriate to bring them in. 7. Scheduling changes:  We request "advanced notification" of any changes or cancellations. 8. Advanced notification:  Defined as a time period of more than 24 hours prior to the originally scheduled appointment. This allows for the appointment to be offered to other patients. 9. Rescheduling:  When a visit is rescheduled, it will require the cancellation of the original appointment. For this reason they both fall within the category of "Cancellations".  10. Cancellations:  They require advanced notification. Any  cancellation less than 24 hours before the  appointment will be recorded as a "No Show". 11. No Show:  Defined as an unkept appointment where the patient failed to notify or declare to the practice their intention or inability to keep the  appointment.  Corrective process for repeat offenders:  1. Tardiness: Three (3) episodes of rescheduling due to late arrivals will be recorded as one (1) "No Show". 2. Cancellation or reschedule: Three (3) cancellations or rescheduling will be recorded as one (1) "No Show". 3. "No Shows": Three (3) "No Shows" within a 12 month period will result in discharge from the practice.  ____________________________________________________________________________________________ ____________________________________________________________________________________________  Preparing for your procedure (without sedation) Instructions: . Oral Intake: Do not eat or drink anything for at least 3 hours prior to your procedure. . Transportation: Unless otherwise stated by your physician, you may drive yourself after the procedure. . Blood Pressure Medicine: Take your blood pressure medicine with a sip of water the morning of the procedure. . Blood thinners:  . Diabetics on insulin: Notify the staff so that you can be scheduled 1st case in the morning. If your diabetes requires high dose insulin, take only  of your normal insulin dose the morning of the procedure and notify the staff that you have done so. . Preventing infections: Shower with an antibacterial soap the morning of your procedure.  . Build-up your immune system: Take 1000 mg of Vitamin C with every meal (3 times a day) the day prior to your procedure. Marland Kitchen Antibiotics: Inform the staff if you have a condition or reason that requires you to take antibiotics before dental procedures. . Pregnancy: If you are pregnant, call and cancel the procedure. . Sickness: If you have a cold, fever, or any active infections, call and cancel the procedure. . Arrival: You must be in the facility at least 30 minutes prior to your scheduled procedure. . Children: Do not bring any children with you. . Dress appropriately: Bring dark clothing that you would not mind if  they get stained. . Valuables: Do not bring any jewelry or valuables. Procedure appointments are reserved for interventional treatments only. Marland Kitchen No Prescription Refills. . No medication changes will be discussed during procedure appointments. . No disability issues will be discussed. ____________________________________________________________________________________________   Pain Management Discharge Instructions  General Discharge Instructions :  If you need to reach your doctor call: Monday-Friday 8:00 am - 4:00 pm at [phone] or toll free [phone].  After clinic hours [phone] to have operator reach doctor.  Bring all of your medication bottles to all your appointments in the pain clinic.  To cancel or reschedule your appointment with Pain Management please remember to call 24 hours in advance to avoid a fee.  Refer to the educational materials which you have been given on: General Risks, I had my Procedure. Discharge Instructions, Post Sedation.  Post Procedure Instructions:  The drugs you were given will stay in your system until tomorrow, so for the next 24 hours you should not drive, make any legal decisions or drink any alcoholic beverages.  You may eat anything you prefer, but it is better to start with liquids then soups and crackers, and gradually work up to solid foods.  Please notify your doctor immediately if you have any unusual bleeding, trouble breathing or pain that is not related to your normal pain.  Depending on the type of procedure that was done, some parts of your body may feel week and/or numb.  This usually clears  up by tonight or the next day.  Walk with the use of an assistive device or accompanied by an adult for the 24 hours.  You may use ice on the affected area for the first 24 hours.  Put ice in a Ziploc bag and cover with a towel and place against area 15 minutes on 15 minutes off.  You may switch to heat after 24 hours.GENERAL RISKS  AND COMPLICATIONS  What are the risk, side effects and possible complications? Generally speaking, most procedures are safe.  However, with any procedure there are risks, side effects, and the possibility of complications.  The risks and complications are dependent upon the sites that are lesioned, or the type of nerve block to be performed.  The closer the procedure is to the spine, the more serious the risks are.  Great care is taken when placing the radio frequency needles, block needles or lesioning probes, but sometimes complications can occur. 1. Infection: Any time there is an injection through the skin, there is a risk of infection.  This is why sterile conditions are used for these blocks.  There are four possible types of infection. 1. Localized skin infection. 2. Central Nervous System Infection-This can be in the form of Meningitis, which can be deadly. 3. Epidural Infections-This can be in the form of an epidural abscess, which can cause pressure inside of the spine, causing compression of the spinal cord with subsequent paralysis. This would require an emergency surgery to decompress, and there are no guarantees that the patient would recover from the paralysis. 4. Discitis-This is an infection of the intervertebral discs.  It occurs in about 1% of discography procedures.  It is difficult to treat and it may lead to surgery.        2. Pain: the needles have to go through skin and soft tissues, will cause soreness.       3. Damage to internal structures:  The nerves to be lesioned may be near blood vessels or    other nerves which can be potentially damaged.       4. Bleeding: Bleeding is more common if the patient is taking blood thinners such as  aspirin, Coumadin, Ticiid, Plavix, etc., or if he/she have some genetic predisposition  such as hemophilia. Bleeding into the spinal canal can cause compression of the spinal  cord with subsequent paralysis.  This would require an emergency  surgery to  decompress and there are no guarantees that the patient would recover from the  paralysis.       5. Pneumothorax:  Puncturing of a lung is a possibility, every time a needle is introduced in  the area of the chest or upper back.  Pneumothorax refers to free air around the  collapsed lung(s), inside of the thoracic cavity (chest cavity).  Another two possible  complications related to a similar event would include: Hemothorax and Chylothorax.   These are variations of the Pneumothorax, where instead of air around the collapsed  lung(s), you may have blood or chyle, respectively.       6. Spinal headaches: They may occur with any procedures in the area of the spine.       7. Persistent CSF (Cerebro-Spinal Fluid) leakage: This is a rare problem, but may occur  with prolonged intrathecal or epidural catheters either due to the formation of a fistulous  track or a dural tear.       8. Nerve damage: By working so close to the  spinal cord, there is always a possibility of  nerve damage, which could be as serious as a permanent spinal cord injury with  paralysis.       9. Death:  Although rare, severe deadly allergic reactions known as "Anaphylactic  reaction" can occur to any of the medications used.      10. Worsening of the symptoms:  We can always make thing worse.  What are the chances of something like this happening? Chances of any of this occuring are extremely low.  By statistics, you have more of a chance of getting killed in a motor vehicle accident: while driving to the hospital than any of the above occurring .  Nevertheless, you should be aware that they are possibilities.  In general, it is similar to taking a shower.  Everybody knows that you can slip, hit your head and get killed.  Does that mean that you should not shower again?  Nevertheless always keep in mind that statistics do not mean anything if you happen to be on the wrong side of them.  Even if a procedure has a 1 (one) in a  1,000,000 (million) chance of going wrong, it you happen to be that one..Also, keep in mind that by statistics, you have more of a chance of having something go wrong when taking medications.  Who should not have this procedure? If you are on a blood thinning medication (e.g. Coumadin, Plavix, see list of "Blood Thinners"), or if you have an active infection going on, you should not have the procedure.  If you are taking any blood thinners, please inform your physician.  How should I prepare for this procedure?  Do not eat or drink anything at least six hours prior to the procedure.  Bring a driver with you .  It cannot be a taxi.  Come accompanied by an adult that can drive you back, and that is strong enough to help you if your legs get weak or numb from the local anesthetic.  Take all of your medicines the morning of the procedure with just enough water to swallow them.  If you have diabetes, make sure that you are scheduled to have your procedure done first thing in the morning, whenever possible.  If you have diabetes, take only half of your insulin dose and notify our nurse that you have done so as soon as you arrive at the clinic.  If you are diabetic, but only take blood sugar pills (oral hypoglycemic), then do not take them on the morning of your procedure.  You may take them after you have had the procedure.  Do not take aspirin or any aspirin-containing medications, at least eleven (11) days prior to the procedure.  They may prolong bleeding.  Wear loose fitting clothing that may be easy to take off and that you would not mind if it got stained with Betadine or blood.  Do not wear any jewelry or perfume  Remove any nail coloring.  It will interfere with some of our monitoring equipment.  NOTE: Remember that this is not meant to be interpreted as a complete list of all possible complications.  Unforeseen problems may occur.  BLOOD THINNERS The following drugs contain aspirin  or other products, which can cause increased bleeding during surgery and should not be taken for 2 weeks prior to and 1 week after surgery.  If you should need take something for relief of minor pain, you may take acetaminophen which is found in  Tylenol,m Datril, Anacin-3 and Panadol. It is not blood thinner. The products listed below are.  Do not take any of the products listed below in addition to any listed on your instruction sheet.  A.P.C or A.P.C with Codeine Codeine Phosphate Capsules #3 Ibuprofen Ridaura  ABC compound Congesprin Imuran rimadil  Advil Cope Indocin Robaxisal  Alka-Seltzer Effervescent Pain Reliever and Antacid Coricidin or Coricidin-D  Indomethacin Rufen  Alka-Seltzer plus Cold Medicine Cosprin Ketoprofen S-A-C Tablets  Anacin Analgesic Tablets or Capsules Coumadin Korlgesic Salflex  Anacin Extra Strength Analgesic tablets or capsules CP-2 Tablets Lanoril Salicylate  Anaprox Cuprimine Capsules Levenox Salocol  Anexsia-D Dalteparin Magan Salsalate  Anodynos Darvon compound Magnesium Salicylate Sine-off  Ansaid Dasin Capsules Magsal Sodium Salicylate  Anturane Depen Capsules Marnal Soma  APF Arthritis pain formula Dewitt's Pills Measurin Stanback  Argesic Dia-Gesic Meclofenamic Sulfinpyrazone  Arthritis Bayer Timed Release Aspirin Diclofenac Meclomen Sulindac  Arthritis pain formula Anacin Dicumarol Medipren Supac  Analgesic (Safety coated) Arthralgen Diffunasal Mefanamic Suprofen  Arthritis Strength Bufferin Dihydrocodeine Mepro Compound Suprol  Arthropan liquid Dopirydamole Methcarbomol with Aspirin Synalgos  ASA tablets/Enseals Disalcid Micrainin Tagament  Ascriptin Doan's Midol Talwin  Ascriptin A/D Dolene Mobidin Tanderil  Ascriptin Extra Strength Dolobid Moblgesic Ticlid  Ascriptin with Codeine Doloprin or Doloprin with Codeine Momentum Tolectin  Asperbuf Duoprin Mono-gesic Trendar  Aspergum Duradyne Motrin or Motrin IB Triminicin  Aspirin plain, buffered or  enteric coated Durasal Myochrisine Trigesic  Aspirin Suppositories Easprin Nalfon Trillsate  Aspirin with Codeine Ecotrin Regular or Extra Strength Naprosyn Uracel  Atromid-S Efficin Naproxen Ursinus  Auranofin Capsules Elmiron Neocylate Vanquish  Axotal Emagrin Norgesic Verin  Azathioprine Empirin or Empirin with Codeine Normiflo Vitamin E  Azolid Emprazil Nuprin Voltaren  Bayer Aspirin plain, buffered or children's or timed BC Tablets or powders Encaprin Orgaran Warfarin Sodium  Buff-a-Comp Enoxaparin Orudis Zorpin  Buff-a-Comp with Codeine Equegesic Os-Cal-Gesic   Buffaprin Excedrin plain, buffered or Extra Strength Oxalid   Bufferin Arthritis Strength Feldene Oxphenbutazone   Bufferin plain or Extra Strength Feldene Capsules Oxycodone with Aspirin   Bufferin with Codeine Fenoprofen Fenoprofen Pabalate or Pabalate-SF   Buffets II Flogesic Panagesic   Buffinol plain or Extra Strength Florinal or Florinal with Codeine Panwarfarin   Buf-Tabs Flurbiprofen Penicillamine   Butalbital Compound Four-way cold tablets Penicillin   Butazolidin Fragmin Pepto-Bismol   Carbenicillin Geminisyn Percodan   Carna Arthritis Reliever Geopen Persantine   Carprofen Gold's salt Persistin   Chloramphenicol Goody's Phenylbutazone   Chloromycetin Haltrain Piroxlcam   Clmetidine heparin Plaquenil   Cllnoril Hyco-pap Ponstel   Clofibrate Hydroxy chloroquine Propoxyphen         Before stopping any of these medications, be sure to consult the physician who ordered them.  Some, such as Coumadin (Warfarin) are ordered to prevent or treat serious conditions such as "deep thrombosis", "pumonary embolisms", and other heart problems.  The amount of time that you may need off of the medication may also vary with the medication and the reason for which you were taking it.  If you are taking any of these medications, please make sure you notify your pain physician before you undergo any  procedures.          Facet Joint Block The facet joints connect the bones of the spine (vertebrae). They make it possible for you to bend, twist, and make other movements with your spine. They also keep you from bending too far, twisting too far, and making other excessive movements. A facet joint block is a procedure  where a numbing medicine (anesthetic) is injected into a facet joint. Often, a type of anti-inflammatory medicine called a steroid is also injected. A facet joint block may be done to diagnose neck or back pain. If the pain gets better after a facet joint block, it means the pain is probably coming from the facet joint. If the pain does not get better, it means the pain is probably not coming from the facet joint. A facet joint block may also be done to relieve neck or back pain caused by an inflamed facet joint. A facet joint block is only done to relieve pain if the pain does not improve with other methods, such as medicine, exercise programs, and physical therapy. Tell a health care provider about:  Any allergies you have.  All medicines you are taking, including vitamins, herbs, eye drops, creams, and over-the-counter medicines.  Any problems you or family members have had with anesthetic medicines.  Any blood disorders you have.  Any surgeries you have had.  Any medical conditions you have.  Whether you are pregnant or may be pregnant. What are the risks? Generally, this is a safe procedure. However, problems may occur, including:  Bleeding.  Injury to a nerve near the injection site.  Pain at the injection site.  Weakness or numbness in areas controlled by nerves near the injection site.  Infection.  Temporary fluid retention.  Allergic reactions to medicines or dyes.  Injury to other structures or organs near the injection site.  What happens before the procedure?  Follow instructions from your health care provider about eating or drinking  restrictions.  Ask your health care provider about: ? Changing or stopping your regular medicines. This is especially important if you are taking diabetes medicines or blood thinners. ? Taking medicines such as aspirin and ibuprofen. These medicines can thin your blood. Do not take these medicines before your procedure if your health care provider instructs you not to.  Do not take any new dietary supplements or medicines without asking your health care provider first.  Plan to have someone take you home after the procedure. What happens during the procedure?  You may need to remove your clothing and dress in an open-back gown.  The procedure will be done while you are lying on an X-ray table. You will most likely be asked to lie on your stomach, but you may be asked to lie in a different position if an injection will be made in your neck.  Machines will be used to monitor your oxygen levels, heart rate, and blood pressure.  If an injection will be made in your neck, an IV tube will be inserted into one of your veins. Fluids and medicine will flow directly into your body through the IV tube.  The area over the facet joint where the injection will be made will be cleaned with soap. The surrounding skin will be covered with clean drapes.  A numbing medicine (local anesthetic) will be applied to your skin. Your skin may sting or burn for a moment.  A video X-ray machine (fluoroscopy) will be used to locate the joint. In some cases, a CT scan may be used.  A contrast dye may be injected into the facet joint area to help locate the joint.  When the joint is located, an anesthetic will be injected into the joint through the needle.  Your health care provider will ask you whether you feel pain relief. If you do feel relief, a steroid  may be injected to provide pain relief for a longer period of time. If you do not feel relief or feel only partial relief, additional injections of an anesthetic  may be made in other facet joints.  The needle will be removed.  Your skin will be cleaned.  A bandage (dressing) will be applied over each injection site. The procedure may vary among health care providers and hospitals. What happens after the procedure?  You will be observed for 15-30 minutes before being allowed to go home. This information is not intended to replace advice given to you by your health care provider. Make sure you discuss any questions you have with your health care provider. Document Released: 09/19/2006 Document Revised: 06/01/2015 Document Reviewed: 01/24/2015 Elsevier Interactive Patient Education  [employer].

## 2016-12-13 NOTE — Progress Notes (Signed)
Safety precautions to be maintained throughout the outpatient stay will include: orient to surroundings, keep bed in low position, maintain call bell within reach at all times, provide assistance with transfer out of bed and ambulation.  

## 2016-12-20 LAB — COMPLIANCE DRUG ANALYSIS, UR

## 2017-01-15 ENCOUNTER — Encounter: Payer: Self-pay | Admitting: Student in an Organized Health Care Education/Training Program

## 2017-01-15 ENCOUNTER — Ambulatory Visit
Payer: Managed Care, Other (non HMO) | Attending: Student in an Organized Health Care Education/Training Program | Admitting: Student in an Organized Health Care Education/Training Program

## 2017-01-15 VITALS — BP 162/78 | HR 66 | Temp 98.6°F | Resp 16 | Ht 61.0 in | Wt 234.0 lb

## 2017-01-15 DIAGNOSIS — M545 Low back pain, unspecified: Secondary | ICD-10-CM

## 2017-01-15 DIAGNOSIS — M4696 Unspecified inflammatory spondylopathy, lumbar region: Secondary | ICD-10-CM

## 2017-01-15 DIAGNOSIS — M5137 Other intervertebral disc degeneration, lumbosacral region: Secondary | ICD-10-CM | POA: Diagnosis not present

## 2017-01-15 DIAGNOSIS — G8929 Other chronic pain: Secondary | ICD-10-CM

## 2017-01-15 DIAGNOSIS — M4310 Spondylolisthesis, site unspecified: Secondary | ICD-10-CM | POA: Diagnosis not present

## 2017-01-15 DIAGNOSIS — M79604 Pain in right leg: Secondary | ICD-10-CM | POA: Insufficient documentation

## 2017-01-15 DIAGNOSIS — M4306 Spondylolysis, lumbar region: Secondary | ICD-10-CM | POA: Diagnosis not present

## 2017-01-15 DIAGNOSIS — M5136 Other intervertebral disc degeneration, lumbar region: Secondary | ICD-10-CM | POA: Insufficient documentation

## 2017-01-15 DIAGNOSIS — M4686 Other specified inflammatory spondylopathies, lumbar region: Secondary | ICD-10-CM | POA: Insufficient documentation

## 2017-01-15 DIAGNOSIS — Z91013 Allergy to seafood: Secondary | ICD-10-CM | POA: Insufficient documentation

## 2017-01-15 DIAGNOSIS — M25552 Pain in left hip: Secondary | ICD-10-CM | POA: Diagnosis not present

## 2017-01-15 DIAGNOSIS — K5732 Diverticulitis of large intestine without perforation or abscess without bleeding: Secondary | ICD-10-CM | POA: Diagnosis not present

## 2017-01-15 DIAGNOSIS — K219 Gastro-esophageal reflux disease without esophagitis: Secondary | ICD-10-CM | POA: Diagnosis not present

## 2017-01-15 DIAGNOSIS — M47817 Spondylosis without myelopathy or radiculopathy, lumbosacral region: Secondary | ICD-10-CM

## 2017-01-15 DIAGNOSIS — Z7982 Long term (current) use of aspirin: Secondary | ICD-10-CM | POA: Diagnosis not present

## 2017-01-15 DIAGNOSIS — M79605 Pain in left leg: Secondary | ICD-10-CM | POA: Insufficient documentation

## 2017-01-15 DIAGNOSIS — M47816 Spondylosis without myelopathy or radiculopathy, lumbar region: Secondary | ICD-10-CM

## 2017-01-15 DIAGNOSIS — G894 Chronic pain syndrome: Secondary | ICD-10-CM

## 2017-01-15 DIAGNOSIS — Z79899 Other long term (current) drug therapy: Secondary | ICD-10-CM | POA: Insufficient documentation

## 2017-01-15 DIAGNOSIS — E785 Hyperlipidemia, unspecified: Secondary | ICD-10-CM | POA: Insufficient documentation

## 2017-01-15 DIAGNOSIS — I10 Essential (primary) hypertension: Secondary | ICD-10-CM | POA: Diagnosis not present

## 2017-01-15 DIAGNOSIS — M51369 Other intervertebral disc degeneration, lumbar region without mention of lumbar back pain or lower extremity pain: Secondary | ICD-10-CM

## 2017-01-15 DIAGNOSIS — M51379 Other intervertebral disc degeneration, lumbosacral region without mention of lumbar back pain or lower extremity pain: Secondary | ICD-10-CM

## 2017-01-15 DIAGNOSIS — Z91040 Latex allergy status: Secondary | ICD-10-CM | POA: Diagnosis not present

## 2017-01-15 NOTE — Patient Instructions (Addendum)
- I will replace order for lumbar medial branch nerve blocks. Hopefully this will get approved by insurance - Follow up with me in 3 weeks for medication managementPain Management Discharge Instructions  General Discharge Instructions :  If you need to reach your doctor call: Monday-Friday 8:00 am - 4:00 pm at [phone] or toll free [phone].  After clinic hours [phone] to have operator reach doctor.  Bring all of your medication bottles to all your appointments in the pain clinic.  To cancel or reschedule your appointment with Pain Management please remember to call 24 hours in advance to avoid a fee.  Refer to the educational materials which you have been given on: General Risks, I had my Procedure. Discharge Instructions, Post Sedation.  Post Procedure Instructions:  The drugs you were given will stay in your system until tomorrow, so for the next 24 hours you should not drive, make any legal decisions or drink any alcoholic beverages.  You may eat anything you prefer, but it is better to start with liquids then soups and crackers, and gradually work up to solid foods.  Please notify your doctor immediately if you have any unusual bleeding, trouble breathing or pain that is not related to your normal pain.  Depending on the type of procedure that was done, some parts of your body may feel week and/or numb.  This usually clears up by tonight or the next day.  Walk with the use of an assistive device or accompanied by an adult for the 24 hours.  You may use ice on the affected area for the first 24 hours.  Put ice in a Ziploc bag and cover with a towel and place against area 15 minutes on 15 minutes off.  You may switch to heat after 24 hours. Facet Joint Block The facet joints connect the bones of the spine (vertebrae). They make it possible for you to bend, twist, and make other movements with your spine. They also keep you from bending too far, twisting too far, and  making other excessive movements. A facet joint block is a procedure where a numbing medicine (anesthetic) is injected into a facet joint. Often, a type of anti-inflammatory medicine called a steroid is also injected. A facet joint block may be done to diagnose neck or back pain. If the pain gets better after a facet joint block, it means the pain is probably coming from the facet joint. If the pain does not get better, it means the pain is probably not coming from the facet joint. A facet joint block may also be done to relieve neck or back pain caused by an inflamed facet joint. A facet joint block is only done to relieve pain if the pain does not improve with other methods, such as medicine, exercise programs, and physical therapy. Tell a health care provider about:  Any allergies you have.  All medicines you are taking, including vitamins, herbs, eye drops, creams, and over-the-counter medicines.  Any problems you or family members have had with anesthetic medicines.  Any blood disorders you have.  Any surgeries you have had.  Any medical conditions you have.  Whether you are pregnant or may be pregnant. What are the risks? Generally, this is a safe procedure. However, problems may occur, including:  Bleeding.  Injury to a nerve near the injection site.  Pain at the injection site.  Weakness or numbness in areas controlled by nerves near the injection site.  Infection.  Temporary fluid retention.  Allergic reactions to medicines or dyes.  Injury to other structures or organs near the injection site.  What happens before the procedure?  Follow instructions from your health care provider about eating or drinking restrictions.  Ask your health care provider about: ? Changing or stopping your regular medicines. This is especially important if you are taking diabetes medicines or blood thinners. ? Taking medicines such as aspirin and ibuprofen. These medicines can thin your  blood. Do not take these medicines before your procedure if your health care provider instructs you not to.  Do not take any new dietary supplements or medicines without asking your health care provider first.  Plan to have someone take you home after the procedure. What happens during the procedure?  You may need to remove your clothing and dress in an open-back gown.  The procedure will be done while you are lying on an X-ray table. You will most likely be asked to lie on your stomach, but you may be asked to lie in a different position if an injection will be made in your neck.  Machines will be used to monitor your oxygen levels, heart rate, and blood pressure.  If an injection will be made in your neck, an IV tube will be inserted into one of your veins. Fluids and medicine will flow directly into your body through the IV tube.  The area over the facet joint where the injection will be made will be cleaned with soap. The surrounding skin will be covered with clean drapes.  A numbing medicine (local anesthetic) will be applied to your skin. Your skin may sting or burn for a moment.  A video X-ray machine (fluoroscopy) will be used to locate the joint. In some cases, a CT scan may be used.  A contrast dye may be injected into the facet joint area to help locate the joint.  When the joint is located, an anesthetic will be injected into the joint through the needle.  Your health care provider will ask you whether you feel pain relief. If you do feel relief, a steroid may be injected to provide pain relief for a longer period of time. If you do not feel relief or feel only partial relief, additional injections of an anesthetic may be made in other facet joints.  The needle will be removed.  Your skin will be cleaned.  A bandage (dressing) will be applied over each injection site. The procedure may vary among health care providers and hospitals. What happens after the  procedure?  You will be observed for 15-30 minutes before being allowed to go home. This information is not intended to replace advice given to you by your health care provider. Make sure you discuss any questions you have with your health care provider. Document Released: 09/19/2006 Document Revised: 06/01/2015 Document Reviewed: 01/24/2015 [employer] Interactive Patient Education  [employer].

## 2017-01-15 NOTE — Progress Notes (Signed)
Safety precautions to be maintained throughout the outpatient stay will include: orient to surroundings, keep bed in low position, maintain call bell within reach at all times, provide assistance with transfer out of bed and ambulation.  

## 2017-01-15 NOTE — Progress Notes (Signed)
Patient's Name: Terri Werner  MRN: [id number]  Referring Provider: Leotis Shames, MD  DOB: [birth year]  PCP: Leotis Shames, MD  DOS: 01/15/2017  Note by: Edward Jolly, MD  Service setting: Ambulatory outpatient  Specialty: Interventional Pain Management  Location: [facility] (AMB) Pain Management Facility    Patient type: Established   Primary Reason(s) for Visit: Encounter for evaluation before starting new chronic pain management plan of care (Level of risk: moderate) CC: Back Pain (lower); Hip Pain (left); and Leg Pain (lower, both legs burning)  HPI  Terri Werner is a 66 y.o. year old, female patient, who comes today for a follow-up evaluation to review the test results and decide on a treatment plan. She has HYPERTENSION, BENIGN; Screening for breast cancer; Arthralgia; Routine general medical examination at a health care facility; Hyperlipidemia; Carotid artery disorder (HCC); Diverticulitis of colon; and Lumbago on her problem list. Her primarily concern today is the Back Pain (lower); Hip Pain (left); and Leg Pain (lower, both legs burning)  Pain Assessment: Location: Lower Back Radiating: down both hips and legs, left hip more pain than right Onset: More than a month ago Duration: Chronic pain Quality: Aching, Constant, Radiating, Sharp Severity: 8 /10 (self-reported pain score)  Note: Reported level is compatible with observation.                   Effect on ADL: pace self Timing: Constant Modifying factors: tens unit  Terri Werner comes in today for a follow-up visit after her initial evaluation on 12/13/2016. Today we went over the results of her tests. These were explained in "Layman's terms". During today's appointment we went over my diagnostic impression, as well as the proposed treatment plan.  66 year old female with a past medical history significant for hypertension, arthralgias, hyperlipidemia, carotid artery disease who presents with low back pain  as well as leg pain which is worsened over the last 2 years. Pain symptoms are worse on the right leg compared to the left leg. She does endorse some occasional numbness and tingling. She does also endorse a grabbing and throbbing type of pain in her low back which extends into her buttocks. No obvious lower extremity weakness or sensory deficits the patient has noted that she has to lean on certain things to help support her weight and minimize pain while she does recommend use of daily living. No red flags including bowel or bladder dysfunction.  Patient has seen Dr. Marcell Barlow with neurosurgery who offered her transforaminal lumbar discectomy and interbody fusion at L4-L5 and L5-S1 secondary to multiple anterolisthesis of L4 on L5 and L5 on S1. This was offered to her in September 2017. She has decided not to go forward with surgery.  Patient was previously seen on 12/13/2016. She was prescribed tizanidine for her muscle spasms. Patient also continues her gabapentin 200 mg daily at bedtime and Mobic 15 mg daily along with 5% lidocaine patches and TENS unit. She states that the tizanidine is not very effective. Patient also does stretches and exercises at home. Of note patient wants to avoid opioid therapy as she drives during the day and wants to avoid sedation.  Patient is frustrated that the lumbar injections were NOT approved by insurance. Patient continues to have low back pain that radiates into bilateral buttocks and posterior thighs consistent with a facet mediated referral pattern.  In considering the treatment plan options, Terri Werner was reminded that I no longer take patients for medication management only. I asked her to let  me know if she had no intention of taking advantage of the interventional therapies, so that we could make arrangements to provide this space to someone interested. I also made it clear that undergoing interventional therapies for the purpose of getting pain  medications is very inappropriate on the part of a patient, and it will not be tolerated in this practice. This type of behavior would suggest true addiction and therefore it requires referral to an addiction specialist.   Further details on both, my assessment(s), as well as the proposed treatment plan, please see below.  Controlled Substance Pharmacotherapy Assessment REMS (Risk Evaluation and Mitigation Strategy)  Analgesic: None MME/day: N/A mg/day. Pill Count: None expected due to no prior prescriptions written by our practice. Brynda Rim, RN  01/15/2017  1:43 PM  Sign at close encounter Safety precautions to be maintained throughout the outpatient stay will include: orient to surroundings, keep bed in low position, maintain call bell within reach at all times, provide assistance with transfer out of bed and ambulation.    Pharmacokinetics: Liberation and absorption (onset of action): WNL Distribution (time to peak effect): WNL Metabolism and excretion (duration of action): WNL         Pharmacodynamics: Desired effects: Analgesia: Terri Werner reports >50% benefit. Functional ability: Patient reports that medication allows her to accomplish basic ADLs Clinically meaningful improvement in function (CMIF): Sustained CMIF goals met Perceived effectiveness: Described as relatively effective, allowing for increase in activities of daily living (ADL) Undesirable effects: Side-effects or Adverse reactions: None reported Monitoring: [place] PMP: Online review of the past 35-month period previously conducted. Not applicable at this point since we have not taken over the patient's medication management yet. List of all Serum Drug Screening Test(s):  No results found for: AMPHSCRSER, BARBSCRSER, BENZOSCRSER, COCAINSCRSER, PCPSCRSER, THCSCRSER, OPIATESCRSER, OXYSCRSER, PROPOXSCRSER List of all UDS test(s) done:  Lab Results  Component Value Date   SUMMARY FINAL 12/13/2016   Last UDS  on record: Summary  Date Value Ref Range Status  12/13/2016 FINAL  Final    Comment:    ==================================================================== TOXASSURE COMP DRUG ANALYSIS,UR ==================================================================== Test                             Result       Flag       Units Drug Present and Declared for Prescription Verification   Naproxen                       PRESENT      EXPECTED Drug Absent but Declared for Prescription Verification   Hydrocodone                    Not Detected UNEXPECTED ng/mg creat   Tramadol                       Not Detected UNEXPECTED   Gabapentin                     Not Detected UNEXPECTED   Tizanidine                     Not Detected UNEXPECTED    Tizanidine, as indicated in the declared medication list, is not    always detected even when used as directed.   Acetaminophen  Not Detected UNEXPECTED    Acetaminophen, as indicated in the declared medication list, is    not always detected even when used as directed.   Diclofenac                     Not Detected UNEXPECTED    Diclofenac, as indicated in the declared medication list, is not    always detected even when used as directed.   Salicylate                     Not Detected UNEXPECTED    Aspirin, as indicated in the declared medication list, is not    always detected even when used as directed.   Ibuprofen                      Not Detected UNEXPECTED    Ibuprofen, as indicated in the declared medication list, is not    always detected even when used as directed.   Lidocaine                      Not Detected UNEXPECTED    Lidocaine, as indicated in the declared medication list, is not    always detected even when used as directed. ==================================================================== Test                      Result    Flag   Units      Ref Range   Creatinine              46               mg/dL       >=66 ==================================================================== Declared Medications:  The flagging and interpretation on this report are based on the  following declared medications.  Unexpected results may arise from  inaccuracies in the declared medications.  **Note: The testing scope of this panel includes these medications:  Gabapentin  Hydrocodone (Norco)  Naproxen (Anaprox)  Tramadol  **Note: The testing scope of this panel does not include small to  moderate amounts of these reported medications:  Acetaminophen (Norco)  Aspirin (Aspirin 81)  Diclofenac (Voltaren)  Ibuprofen  Lidocaine (Lidoderm)  Tizanidine (Zanaflex)  **Note: The testing scope of this panel does not include following  reported medications:  Amlodipine  Fluticasone (Flonase)  Magnesium  Meloxicam (Mobic)  Nystatin (Mycostatin)  Pravastatin (Pravachol)  Vitamin D ==================================================================== For clinical consultation, please call [phone]. ====================================================================    UDS interpretation: No unexpected findings.          Medication Assessment Form: Not applicable. Treatment compliance: Not applicable Risk Assessment Profile: Aberrant behavior: See initial evaluations. None observed or detected today Comorbid factors increasing risk of overdose: See initial evaluation. No additional risks detected today Medical Psychology Evaluation: Not applicable.     Opioid Risk Tool - 01/15/17 1341      Family History of Substance Abuse   Alcohol Negative   Illegal Drugs Negative   Rx Drugs Negative     Personal History of Substance Abuse   Alcohol Negative   Illegal Drugs Negative   Rx Drugs Negative     Age   Age between 16-45 years  No     History of Preadolescent Sexual Abuse   History of Preadolescent Sexual Abuse Negative or Female     Psychological Disease   Psychological Disease Negative    Depression Negative  Total Score   Opioid Risk Tool Scoring 0   Opioid Risk Interpretation Low Risk     ORT Scoring interpretation table:  Score <3 = Low Risk for SUD  Score between 4-7 = Moderate Risk for SUD  Score >8 = High Risk for Opioid Abuse   Risk Mitigation Strategies:  Patient opioid safety counseling: Opioid therapy will not be included in the treatment plan. Patient prefers non-opioid management Patient-Prescriber Agreement (PPA): Present and active.  Controlled substance notification to other providers: None required. No opioid therapy.  Pharmacologic Plan: The patient has expressed her desire to avoid opioids as much as possible             Laboratory Chemistry  Inflammation Markers (CRP: Acute Phase) (ESR: Chronic Phase) No results found for: CRP, ESRSEDRATE               Renal Function Markers Lab Results  Component Value Date   BUN 15 07/05/2014   CREATININE 0.89 07/05/2014   GFRAA >60 07/03/2012   GFRNONAA >60 07/03/2012                 Hepatic Function Markers Lab Results  Component Value Date   AST 25 07/05/2014   ALT 24 07/05/2014   ALBUMIN 3.8 07/05/2014   ALKPHOS 52 07/05/2014   HCVAB NEGATIVE 07/06/2015                 Electrolytes Lab Results  Component Value Date   NA 142 07/05/2014   K 4.4 07/05/2014   CL 106 07/05/2014   CALCIUM 9.4 07/05/2014                 Neuropathy Markers No results found for: [id number]               Bone Pathology Markers Lab Results  Component Value Date   ALKPHOS 52 07/05/2014   VD25OH 28.98 (L) 04/15/2014   CALCIUM 9.4 07/05/2014                 Coagulation Parameters Lab Results  Component Value Date   PLT 315.0 07/05/2014                 Cardiovascular Markers Lab Results  Component Value Date   HGB 12.3 07/05/2014   HCT 36.9 07/05/2014                 Note: Lab results reviewed.  Recent Diagnostic Imaging Review  Mm Screening Breast Tomo Bilateral  Result Date:  11/19/2016 CLINICAL DATA:  Screening. EXAM: 2D DIGITAL SCREENING BILATERAL MAMMOGRAM WITH CAD AND ADJUNCT TOMO COMPARISON:  Previous exam(s). ACR Breast Density Category b: There are scattered areas of fibroglandular density. FINDINGS: There are no findings suspicious for malignancy. Images were processed with CAD. IMPRESSION: No mammographic evidence of malignancy. A result letter of this screening mammogram will be mailed directly to the patient. RECOMMENDATION: Screening mammogram in one year. (Code:SM-B-01Y) BI-RADS CATEGORY  1: Negative. Electronically Signed   By: Bary Richard M.D.   On: 11/19/2016 10:29   Cervical Imaging: Cervical MR wo contrast:  Results for orders placed during the hospital encounter of 02/07/16  MR Cervical Spine Wo Contrast   Narrative CLINICAL DATA:  66 year old female with neck stiffness, hand swelling. Abnormal reflexes. Initial encounter.  EXAM: MRI CERVICAL SPINE WITHOUT CONTRAST  TECHNIQUE: Multiplanar, multisequence MR imaging of the cervical spine was performed. No intravenous contrast was administered.  COMPARISON:  Cervical spine radiographs 05/20/2014. Neck CT 02/05/2008.  FINDINGS: Alignment: Stable cervical lordosis.  Vertebrae: No marrow edema or evidence of acute osseous abnormality.  Cord: Spinal cord signal is within normal limits at all visualized levels.  Posterior Fossa, vertebral arteries, paraspinal tissues: Cervicomedullary junction is within normal limits. Negative visualized posterior [place] negative neck soft tissues. Preserved major vascular flow voids.  Disc levels:  C2-C3:  Mild uncovertebral hypertrophy.  No stenosis.  C3-C4: Mild facet and uncovertebral hypertrophy greater on the left. Mild left greater than right C4 foraminal stenosis.  C4-C5: Moderate to severe left facet hypertrophy. Mild right facet hypertrophy. Mild uncovertebral hypertrophy. Mild ligament flavum hypertrophy. No spinal stenosis. Moderate left C5  foraminal stenosis.  C5-C6: Circumferential disc bulge. Superimposed small central disc protrusion (series 6, image 17). Moderate facet hypertrophy greater on the left. Mild ligament flavum hypertrophy. Borderline to mild spinal stenosis. No significant foraminal stenosis.  C6-C7: Trace anterolisthesis. Moderate facet hypertrophy on the left. Mild ligament flavum hypertrophy. Mild left C7 foraminal stenosis.  C7-T1:  Negative.  No upper thoracic spinal stenosis.  IMPRESSION: 1.  No acute osseous abnormality. 2. Dominant cervical degenerative finding is facet arthropathy, which in general is greater on the left. This contributes to up to moderate neural foraminal stenosis at the left C5 nerve level. 3. Comparatively mild cervical disc degeneration, most pronounced at C5-C6. Mild spinal stenosis at that level with no spinal cord mass effect.   Electronically Signed   By: Odessa Fleming M.D.   On: 02/07/2016 14:50     Results for orders placed during the hospital encounter of 05/20/14  DG Cervical Spine Complete   Narrative CLINICAL DATA:  Right-sided neck and shoulder pain for 3 months, initial encounter, no known injury  EXAM: CERVICAL SPINE  4+ VIEWS  COMPARISON:  None.  FINDINGS: Seven cervical segments are well visualized. Vertebral body height is well maintained. Mild osteophytic changes and facet hypertrophic changes are seen. The odontoid is within normal limits. Very mild neural foraminal narrowing is noted on the left at C4-5 and to a lesser degree at C6-7.  IMPRESSION: Mild degenerative change without acute abnormality.   Electronically Signed   By: Alcide Clever M.D.   On: 05/20/2014 11:25     Shoulder-R DG:  Results for orders placed during the hospital encounter of 05/20/14  DG Shoulder Right   Narrative CLINICAL DATA:  Right shoulder pain for 3 months, no known injury, initial encounter  EXAM: RIGHT SHOULDER - 2+ VIEW  COMPARISON:   None.  FINDINGS: There is no evidence of fracture or dislocation. There is no evidence of arthropathy or other focal bone abnormality. Soft tissues are unremarkable.  IMPRESSION: No acute abnormality noted.   Electronically Signed   By: Alcide Clever M.D.   On: 05/20/2014 11:26     Thoracic DG 2-3 views:  Results for orders placed in visit on 11/15/09  DG Thoracic Spine 2 View   Narrative   PRIOR REPORT IMPORTED FROM THE [facility] WORKFLOW SYSTEM   REASON FOR EXAM:    back pain  COMMENTS:   PROCEDURE:     DXR - DXR THORACIC  AP AND LATERAL  - Nov 15 2009 11:51AM   RESULT:     Dextroscoliosis is identified within the thoracic spine. There  is no evidence of acute fracture or dislocation. Degenerative disc disease  changes are identified within the upper thoracic spine as well as at the  T11-T12 level.   IMPRESSION:   No evidence of acute osseous abnormalities.   Thank  you for the opportunity to contribute to the care of your patient.        Lumbosacral Imaging: Lumbar MR wo contrast:  Results for orders placed during the hospital encounter of 09/07/15  MR Lumbar Spine Wo Contrast   Narrative CLINICAL DATA:  Low back pain for 1.5 years but worsening over the past 6 weeks, radiating down the right leg.  EXAM: MRI LUMBAR SPINE WITHOUT CONTRAST  TECHNIQUE: Multiplanar, multisequence MR imaging of the lumbar spine was performed. No intravenous contrast was administered.  COMPARISON:  08/04/2015  FINDINGS: Review of prior chest and lumbar radiographs reveals 12 pairs of ribs and 6 lumbar type non-rib-bearing vertebra, the lowest of which is partially segmental. Accordingly, the transitional lumbosacral vertebra is assumed to represent the S1 level. Careful correlation with this numbering strategy prior to any procedural intervention would be recommended.  Please note that the axial images arm is registered with the sagittal T2 and T1 weighted images but are  registered reasonably well with the sagittal STIR images. In order to prevent confusion, I went ahead and labeled the axial T2 intervertebral disc levels.  Multilevel degenerative subluxations include 3 mm retrolisthesis at T11-12, L1- 2, and L2- 3; 3 mm degenerative anterolisthesis at L3-4; 6 mm anterolisthesis at L4-5; and 3 mm anterolisthesis at L5-S 1. Intervertebral disc desiccation is observed at all levels in the lumbar spine.  Degenerative facet arthropathy with associated edema noted at the L5-S1 level bilaterally, with bilateral facet joint effusions.  Mildly exaggerated lumbar lordosis. Additional findings at individual levels are as follows:  Disc bulges at T10-11 and T11-12 without impingement. Additional findings at individual levels are as follows:  L1-2:  No impingement.  Mild disc bulge.  L2-3: No impingement. Disc bulge and mild bilateral facet arthropathy.  L3-4: Mild right and borderline left subarticular lateral recess stenosis, borderline bilateral foraminal stenosis, and mild displacement of the right L3 nerve in the lateral extraforaminal space due to disc bulge, right lateral extraforaminal disc protrusion, and left greater than right facet arthropathy. Borderline central narrowing of the thecal sac.  L4-5: Mild central narrowing of the thecal sac with mild bilateral subarticular lateral recess stenosis as well as mild bilateral foraminal stenosis due to disc bulge and facet arthropathy. There is mild displacement of both L4 nerves in the lateral extraforaminal space.  L5-S1: Moderate left and mild right foraminal stenosis with mild bilateral subarticular lateral recess stenosis and moderate central narrowing of the thecal sac due to disc bulge, intervertebral spurring, and facet spurring. Bilateral facet joint effusions.  S1-2:  No impingement.  IMPRESSION: 1. Transitional lumbosacral S1 vertebra. 2. Lumbar spondylosis and degenerative disc  disease causing moderate impingement at L5-S1 and mild impingement at L3-4 and L4-5, as detailed above. 3. Please take special note that the axial images are mis-registered with the sagittal T2 and T1 weighted images. I went ahead and labeled the axial T2 weighted images at each disc space in order to clarify the actual levels.   Electronically Signed   By: Gaylyn Rong M.D.   On: 09/07/2015 09:29    Lumbar DG 2-3 views:  Results for orders placed in visit on 11/15/09  DG Lumbar Spine 2-3 Views   Narrative  PRIOR REPORT IMPORTED FROM THE [facility] WORKFLOW SYSTEM   REASON FOR EXAM:    back pain  COMMENTS:   PROCEDURE:     DXR - DXR LUMBAR SPINE AP AND LATERAL  - Nov 15 2009  12:09PM   RESULT:  Comparison is made to a prior study dated 06/30/2007.   There is no evidence of fracture, dislocation or malalignment.  Osteoarthritic changes are appreciated at the L5-S1 level and most severe  at  the S1, S2 level. There is no evidence of acute fracture or dislocation.   IMPRESSION:   Degenerative disease changes without evidence of acute osseous  abnormalities.   Thank you for the opportunity to contribute to the care of your patient.       Lumbar DG (Complete) 4+V:  Results for orders placed during the hospital encounter of 08/04/15  DG Lumbar Spine Complete   Narrative CLINICAL DATA:  Chronic low back pain worse over the past month. No injury. Pain radiates to right lower extremity.  EXAM: LUMBAR SPINE - COMPLETE 4+ VIEW  COMPARISON:  11/15/2009 and CT 06/22/2014  FINDINGS: Examination demonstrates mild spondylosis of the lumbar spine with moderate facet arthropathy over the mid to lower lumbar spine. There is disc space narrowing at the L4-5 level. There is a subtle grade 1 anterolisthesis of L4 on L5 with slight interval progression. No evidence of compression fracture.  IMPRESSION: Minimal spondylosis of the lumbar spine with disc disease at the L4-5  level.  Grade 1 anterolisthesis of L4 on L5 with slight interval progression.   Electronically Signed   By: Elberta Fortis M.D.   On: 08/04/2015 17:37    Knee-L MR w contrast:  Results for orders placed in visit on 05/29/08  MR Knee Left  Wo Contrast   Narrative   PRIOR REPORT IMPORTED FROM THE [facility] WORKFLOW SYSTEM   REASON FOR EXAM:    Left Knee Pain Eval MeniscusTear  COMMENTS:   PROCEDURE:     MR  - MR KNEE LT  WO CONTRAST  - May 29 2008  8:45AM   RESULT:     Multiplanar/multisequence imaging of the LEFT knee was  obtained  without the administration of gadolinium.   Evaluation of the patella demonstrate an area of increased T2 signal  within  the subchondral osseous portions of the patella extending from the  superior  medial periphery of the patella to the inferior medial aspect. There is  destruction of the cartilage along the superior medial portion and diffuse  thinning of the remaining subpatellar cartilage. There does not appear to  be  definite indentation and these findings are consistent with severe  chondromalacia patella. An element of degenerative cystic change is also a  diagnostic consideration particularly in the areas where the overlying  cartilage is maintained. A subpatella effusion is identified which appears  to be moderate to large in size with extension into the medial and lateral  compartments. The neurovascular bundle appears intact. Fluid is  appreciated  within the popliteal fossa. The remaining osseous structures demonstrate  increased T2 signal within the medial femoral condyle central and  anteriorly. An irregularly linearly oriented region of signal void  projects  along the subarticular portion of the femoral condyle. There does not  appear  to be indentation of the overlying cartilage nor osseous portion of the  condyle and these findings are consistent with an ostial defect with  intact  overlying cartilage. There is thinning of  the femoral and tibial articular  cartilage though the architecture appears to be intact. Note; mild edema  is  appreciated involving the adjacent tibial plateau without MR evidence of a  definite fracture. Evaluation of the medial collateral ligament and  lateral  collateral ligamentous complex demonstrates intact architecture  though  fluid  signal is appreciated deep and superficial to the ligaments. The  quadriceps  tendon and patellar ligaments are intact. Fluid signal is appreciated  coursing along the expected course of the anterior cruciate ligament and  intact fibers are identified. The femoral and tibial insertions appear to  be  intact. The quadriceps tendon and patellar ligaments are intact.  Evaluation  of the menisci demonstrates an area of increased T2 signal along the  posterior periphery of the medial meniscus demonstrating articular  communication. This is in the region of the femoral and tibial  abnormalities. Note; there is mild osteophytosis involving the osseous  structures.   IMPRESSION:  1.     Findings consistent with severe chondromalacia patella with likely  superimposed osteoarthritic cyst formation.  2.     Ostial defect involving the medial femoral condyle posteriorly and  centrally without an overlying chondral component.  3.     Mild edema within the medial tibial plateau.  4.     Findings consistent with a meniscal tear involving the periphery of  the posterior horn of the medial meniscus.  5.     Meniscocapsular separation within the medial and lateral  compartments.  6.     Subpatella effusion.   Thank you for the opportunity to contribute to the care of your patient.        Knee-L DG 4 views:  Results for orders placed during the hospital encounter of 11/25/14  DG Knee Complete 4 Views Left   Narrative CLINICAL DATA:  Left knee pain for 1 month with worsening over the last 2 weeks, no known injury, history of torn meniscus  EXAM: LEFT  KNEE - COMPLETE 4+ VIEW  COMPARISON:  None.  FINDINGS: Significant joint space narrowing is noted medially with subchondral sclerosis and osteophytic changes. No significant joint effusion is seen. Mild patellofemoral degenerative changes are noted as well.  IMPRESSION: Degenerative change without acute abnormality.   Electronically Signed   By: Alcide Clever M.D.   On: 11/25/2014 16:57    Note: Results of ordered imaging test(s) reviewed and explained to patient in Layman's terms. Copy of results provided to patient  Meds   Current Outpatient Prescriptions:  .  amLODipine (NORVASC) 10 MG tablet, take 1 tablet by mouth once daily, Disp: 30 tablet, Rfl: 4 .  aspirin 81 MG tablet, Take 81 mg by mouth daily., Disp: , Rfl:  .  Cholecalciferol (VITAMIN D) 2000 UNITS CAPS, Take 2,000 Units by mouth daily., Disp: , Rfl:  .  diclofenac sodium (VOLTAREN) 1 % GEL, Apply 2 g topically 4 (four) times daily. Uses a couple time a week, Disp: 100 g, Rfl: 2 .  fluticasone (FLONASE) 50 MCG/ACT nasal spray, Place 2 sprays into both nostrils daily. (Patient taking differently: Place 2 sprays into both nostrils as needed. ), Disp: 16 g, Rfl: 6 .  gabapentin (NEURONTIN) 100 MG capsule, Take 200 mg by mouth at bedtime., Disp: , Rfl:  .  HYDROcodone-acetaminophen (NORCO/VICODIN) 5-325 MG tablet, Take 1 tablet by mouth 3 (three) times daily as needed for moderate pain. (Patient taking differently: Take 1 tablet by mouth as needed for moderate pain. ), Disp: 30 tablet, Rfl: 0 .  ibuprofen (ADVIL,MOTRIN) 200 MG tablet, Take 400 mg by mouth daily., Disp: , Rfl:  .  lidocaine (LIDODERM) 5 %, Place 1 patch onto the skin daily. Remove & Discard patch within 12 hours., Disp: 30 patch, Rfl: 0 .  Magnesium 250 MG TABS, Take  250 mg by mouth daily., Disp: , Rfl:  .  meloxicam (MOBIC) 15 MG tablet, Take 1 tablet (15 mg total) by mouth daily as needed for pain., Disp: 30 tablet, Rfl: 3 .  naproxen sodium (ANAPROX) 220  MG tablet, Take 440 mg by mouth daily as needed., Disp: , Rfl:  .  nystatin cream (MYCOSTATIN), Apply 1 application topically 2 (two) times daily., Disp: , Rfl:  .  pravastatin (PRAVACHOL) 20 MG tablet, Take 1 tablet (20 mg total) by mouth at bedtime., Disp: 90 tablet, Rfl: 1 .  tizanidine (ZANAFLEX) 2 MG capsule, 2-4 mg BID prn spams, Disp: 60 capsule, Rfl: 2 .  traMADol (ULTRAM) 50 MG tablet, Take 1-2 tablets (50-100 mg total) by mouth every 8 (eight) hours as needed., Disp: 90 tablet, Rfl: 1  ROS  Constitutional: Denies any fever or chills Gastrointestinal: No reported hemesis, hematochezia, vomiting, or acute GI distress Musculoskeletal: Denies any acute onset joint swelling, redness, loss of ROM, or weakness Neurological: No reported episodes of acute onset apraxia, aphasia, dysarthria, agnosia, amnesia, paralysis, loss of coordination, or loss of consciousness  Allergies  Terri Werner is allergic to latex; lisinopril; and shrimp [shellfish allergy].  PFSH  Drug: Terri Werner  reports that she does not use drugs. Alcohol:  reports that she drinks alcohol. Tobacco:  reports that she has never smoked. She has never used smokeless tobacco. Medical:  has a past medical history of Arthritis; Colon polyps (2010); Genital warts; GERD (gastroesophageal reflux disease); Heart murmur; Hepatitis B; History of blood transfusion; History of chicken pox; History of hiatal hernia; History of torn meniscus of right knee; HTN (hypertension); migraine headaches; Hypertension; and Hypopotassemia. Surgical: Terri Werner  has a past surgical history that includes Tonsillectomy; Vesicovaginal fistula closure w/ TAH; Cesarean section; laproscopic Left knee repair (1971); Ectopic pregnancy surgery; Total abdominal hysterectomy w/ bilateral salpingoophorectomy (2006); Knee arthroscopy; Appendectomy (1980); Abdominal hysterectomy; Hernia repair; and Colonoscopy with propofol (N/A,  11/01/2014). Family: family history includes Alzheimer's disease in her mother; Birth defects in her maternal grandmother; Breast cancer in her sister; Cancer in her sister; Colon cancer in her maternal grandmother; Dementia in her mother; Hyperlipidemia in her maternal grandfather and unknown relative; Hypertension in her maternal grandfather and mother.  Constitutional Exam  General appearance: Well nourished, well developed, and well hydrated. In no apparent acute distress Vitals:   01/15/17 1332  BP: (!) 162/78  Pulse: 66  Resp: 16  Temp: 98.6 F (37 C)  SpO2: 100%  Weight: 234 lb (106.1 kg)  Height: 5\' 1"  (1.549 m)   BMI Assessment: Estimated body mass index is 44.21 kg/m as calculated from the following:   Height as of this encounter: 5\' 1"  (1.549 m).   Weight as of this encounter: 234 lb (106.1 kg).  BMI interpretation table: BMI level Category Range association with higher incidence of chronic pain  <18 kg/m2 Underweight   18.5-24.9 kg/m2 Ideal body weight   25-29.9 kg/m2 Overweight Increased incidence by 20%  30-34.9 kg/m2 Obese (Class I) Increased incidence by 68%  35-39.9 kg/m2 Severe obesity (Class II) Increased incidence by 136%  >40 kg/m2 Extreme obesity (Class III) Increased incidence by 254%   BMI Readings from Last 4 Encounters:  01/15/17 44.21 kg/m  12/13/16 45.35 kg/m  03/12/16 47.01 kg/m  12/07/15 46.07 kg/m   Wt Readings from Last 4 Encounters:  01/15/17 234 lb (106.1 kg)  12/13/16 240 lb (108.9 kg)  03/12/16 248 lb 12.8 oz (112.9 kg)  12/07/15 243 lb 12.8  oz (110.6 kg)  Psych/Mental status: Alert, oriented x 3 (person, place, & time)       Eyes: PERLA Respiratory: No evidence of acute respiratory distress  Cervical Spine Area Exam  Skin & Axial Inspection: No masses, redness, edema, swelling, or associated skin lesions Alignment: Symmetrical Functional ROM: Unrestricted ROM      Stability: No instability detected Muscle Tone/Strength:  Functionally intact. No obvious neuro-muscular anomalies detected. Sensory (Neurological): Unimpaired Palpation: No palpable anomalies              Upper Extremity (UE) Exam    Side: Right upper extremity  Side: Left upper extremity  Skin & Extremity Inspection: Skin color, temperature, and hair growth are WNL. No peripheral edema or cyanosis. No masses, redness, swelling, asymmetry, or associated skin lesions. No contractures.  Skin & Extremity Inspection: Skin color, temperature, and hair growth are WNL. No peripheral edema or cyanosis. No masses, redness, swelling, asymmetry, or associated skin lesions. No contractures.  Functional ROM: Unrestricted ROM          Functional ROM: Unrestricted ROM          Muscle Tone/Strength: Functionally intact. No obvious neuro-muscular anomalies detected.  Muscle Tone/Strength: Functionally intact. No obvious neuro-muscular anomalies detected.  Sensory (Neurological): Unimpaired          Sensory (Neurological): Unimpaired          Palpation: No palpable anomalies              Palpation: No palpable anomalies              Specialized Test(s): Deferred         Specialized Test(s): Deferred          Thoracic Spine Area Exam  Skin & Axial Inspection: No masses, redness, or swelling Alignment: Symmetrical Functional ROM: Unrestricted ROM Stability: No instability detected Muscle Tone/Strength: Functionally intact. No obvious neuro-muscular anomalies detected. Sensory (Neurological): Unimpaired Muscle strength & Tone: No palpable anomalies  Lumbar Spine Area Exam  Inspection: No masses, redness, or swelling Alignment: Symmetrical Functional ROM: Decreased ROM      Stability: No instability detected Muscle strength & Tone: Functionally intact Sensory: Movement-associated discomfort Palpation: Complains of area being tender to palpation  Overlying lumbar facets Provocative Tests: Lumbar Hyperextension and rotation test: Positive      pain radiation to  posterior thighs up to knees, Pain with facet loading Lumbar Lateral bending test: Positive       Patrick's Maneuver: evaluation deferred today                    Gait & Posture Assessment  Ambulation: Unassisted Gait: Relatively normal for age and body habitus Posture: WNL   Lower Extremity Exam    Side: Right lower extremity  Side: Left lower extremity  Skin & Extremity Inspection: Skin color, temperature, and hair growth are WNL. No peripheral edema or cyanosis. No masses, redness, swelling, asymmetry, or associated skin lesions. No contractures.  Skin & Extremity Inspection: Skin color, temperature, and hair growth are WNL. No peripheral edema or cyanosis. No masses, redness, swelling, asymmetry, or associated skin lesions. No contractures.  Functional ROM: Unrestricted ROM          Functional ROM: Unrestricted ROM          Muscle Tone/Strength: Functionally intact. No obvious neuro-muscular anomalies detected.  Muscle Tone/Strength: Functionally intact. No obvious neuro-muscular anomalies detected.  Sensory (Neurological): Unimpaired  Sensory (Neurological): Unimpaired  Palpation: No palpable anomalies  Palpation:  No palpable anomalies   Assessment & Plan  Primary Diagnosis & Pertinent Problem List: The primary encounter diagnosis was Spondylosis without myelopathy or radiculopathy, lumbar region. Diagnoses of Lumbar spondylolysis, Lumbar degenerative disc disease, Lumbar facet arthropathy (HCC), Chronic low back pain, unspecified back pain laterality, with sciatica presence unspecified, and Chronic pain syndrome were also pertinent to this visit.  Visit Diagnosis: 1. Spondylosis without myelopathy or radiculopathy, lumbar region   2. Lumbar spondylolysis   3. Lumbar degenerative disc disease   4. Lumbar facet arthropathy (HCC)   5. Chronic low back pain, unspecified back pain laterality, with sciatica presence unspecified   6. Chronic pain syndrome    66 year old female with a past  medical history significant for hypertension, arthralgias, hyperlipidemia, carotid artery disease who presents with low back pain as well as leg pain which is worsened over the last 2 years. Patient has seen Dr. Marcell Barlow with neurosurgery who offered her transforaminal lumbar discectomy and interbody fusion at L4-L5 and L5-S1 secondary to multiple anterolisthesis of L4 on L5 and L5 on S1. This was offered to her in September 2017. She has decided not to go forward with surgery.   Patient's lumbar MRI shows lumbar spondylosis and degenerative disc disease causing moderate impingement at L5-S1 and mild impingement at L3-4 and L4-5. The patient has tried epidural steroid injections in the past which were not very effective for her pain (series of 3, TF-ESI L5/S1- last one 6/17).   Given her lumbar spondylosis and physical exam findings of pain with facet loading and lateral rotation, I think it would be reasonable to pursue lumbar medial branch blocks, diagnostic, with goal radiofrequency ablation since she has never done these and her pain has gotten worse. Patient has tried tizanidine which I prescribed to her on her first visit with me on 12/13/2016 which she states is not providing her with effective pain relief. Patient is on multimodal therapy including gabapentin, meloxicam, tizanidine.   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 At this time, patient could benefit from diagnostic lumbar medial branch blocks, bilaterally, L3-L5 for her lumbar spondylosis and facet arthropathy. Her physical exam findings are consistent with facet arthropathy including pain with lumbar extension and lateral rotation, bilaterally and pain overlying the lumbar facets  Plan of Care  Plan:  1. Lumbar medial branch nerve blocks, L3-L5, bilaterally, with goal radiofrequency ablation (this is the second time we are ordering this procedure. This procedure was ordered on the patient's  first visit with [place]. This was denied by insurance. 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 Continue mobility, gabapentin, Voltaren gel, lidocaine patches, tizanidine as previously prescribed   3. The patient has tried oxycodone, tramadol, hydrocodone in the past which were not very effective. She is not interested in restarting opioid therapy given her job and does not want to be sedated or drowsy. She also has issues with constipation and utilizes a stool softener.    Lab-work, procedure(s), and/or referral(s): Orders Placed This Encounter  Procedures  . LUMBAR FACET(MEDIAL BRANCH NERVE BLOCK) MBNB    Pharmacological management options:  Opioid Analgesics: The patient was informed that there is no guarantee that she would be a candidate for opioid analgesics. The decision will be  made following CDC guidelines. This decision will be based on the results of diagnostic studies, as well as Terri Werner risk profile.   Membrane stabilizer: Currently on gabapentin 200 mg daily at bedtime. Higher doses made her "crazy".  Muscle relaxant: Has tried Flexeril in the past which made her sedated. We'll try tizanidine at this visit, 2-4 mg twice a day when necessary  NSAID: Currently taking Mobic 15 mg daily.  Other analgesic(s): Utilizing Voltaren gel, 5% lidocaine ointment. Has tried physical therapy, trigger point injections, dry needling in past      Interventional management options:  Considering:   -Diagnostic bilateral L3-L5 lumbar medial branch nerve blocks. -Trigger point injections. -Genicular nerve blocks. -Cervical medial branch nerve blocks, bilateral C4 C5 C6 -Spinal cord stimulator trial    PRN Procedures:   To be determined  at a later time   Provider-requested follow-up: Return in about 3 weeks (around 02/05/2017).  Future Appointments Date Time Provider Department Center  02/04/2017 8:45 AM Edward Jolly, MD [facility] None  02/07/2018 1:00 PM [facility] None  02/07/2018 2:00 PM Dew, Marlow Baars, MD [facility] None    Primary Care Physician: Leotis Shames, MD Location: [facility] Note by: Edward Jolly, M.D Date: 01/15/2017; Time: 2:14 PM  Patient Instructions  - I will replace order for lumbar medial branch nerve blocks. Hopefully this will get approved by insurance - Follow up with me in 3 weeks for medication managementPain Management Discharge Instructions  General Discharge Instructions :  If you need to reach your doctor call: Monday-Friday 8:00 am - 4:00 pm at [phone] or toll free [phone].  After clinic hours [phone] to have operator reach doctor.  Bring all of your medication bottles to all your appointments in the pain clinic.  To cancel or reschedule your appointment with Pain Management please remember to call 24 hours in advance to avoid a fee.  Refer to the educational materials which you have been given on: General Risks, I had my Procedure. Discharge Instructions, Post Sedation.  Post Procedure Instructions:  The drugs you were given will stay in your system until tomorrow, so for the next 24 hours you should not drive, make any legal decisions or drink any alcoholic beverages.  You may eat anything you prefer, but it is better to start with liquids then soups and crackers, and gradually work up to solid foods.  Please notify your doctor immediately if you have any unusual bleeding, trouble breathing or pain that is not related to your normal pain.  Depending on the type of procedure that was done, some parts of your body may feel week and/or numb.  This usually clears up by tonight or the next day.  Walk with the use of an assistive  device or accompanied by an adult for the 24 hours.  You may use ice on the affected area for the first 24 hours.  Put ice in a Ziploc bag and cover with a towel and place against area 15 minutes on 15 minutes off.  You may switch to heat after 24 hours. Facet Joint Block The facet joints connect the bones of the spine (vertebrae). They make it possible for you to bend, twist, and make other movements with your spine. They also keep you from bending too far, twisting too far, and making other excessive movements. A facet joint block is a procedure where a numbing medicine (anesthetic) is injected into a facet joint. Often, a type of anti-inflammatory medicine  called a steroid is also injected. A facet joint block may be done to diagnose neck or back pain. If the pain gets better after a facet joint block, it means the pain is probably coming from the facet joint. If the pain does not get better, it means the pain is probably not coming from the facet joint. A facet joint block may also be done to relieve neck or back pain caused by an inflamed facet joint. A facet joint block is only done to relieve pain if the pain does not improve with other methods, such as medicine, exercise programs, and physical therapy. Tell a health care provider about:  Any allergies you have.  All medicines you are taking, including vitamins, herbs, eye drops, creams, and over-the-counter medicines.  Any problems you or family members have had with anesthetic medicines.  Any blood disorders you have.  Any surgeries you have had.  Any medical conditions you have.  Whether you are pregnant or may be pregnant. What are the risks? Generally, this is a safe procedure. However, problems may occur, including:  Bleeding.  Injury to a nerve near the injection site.  Pain at the injection site.  Weakness or numbness in areas controlled by nerves near the injection site.  Infection.  Temporary fluid  retention.  Allergic reactions to medicines or dyes.  Injury to other structures or organs near the injection site.  What happens before the procedure?  Follow instructions from your health care provider about eating or drinking restrictions.  Ask your health care provider about: ? Changing or stopping your regular medicines. This is especially important if you are taking diabetes medicines or blood thinners. ? Taking medicines such as aspirin and ibuprofen. These medicines can thin your blood. Do not take these medicines before your procedure if your health care provider instructs you not to.  Do not take any new dietary supplements or medicines without asking your health care provider first.  Plan to have someone take you home after the procedure. What happens during the procedure?  You may need to remove your clothing and dress in an open-back gown.  The procedure will be done while you are lying on an X-ray table. You will most likely be asked to lie on your stomach, but you may be asked to lie in a different position if an injection will be made in your neck.  Machines will be used to monitor your oxygen levels, heart rate, and blood pressure.  If an injection will be made in your neck, an IV tube will be inserted into one of your veins. Fluids and medicine will flow directly into your body through the IV tube.  The area over the facet joint where the injection will be made will be cleaned with soap. The surrounding skin will be covered with clean drapes.  A numbing medicine (local anesthetic) will be applied to your skin. Your skin may sting or burn for a moment.  A video X-ray machine (fluoroscopy) will be used to locate the joint. In some cases, a CT scan may be used.  A contrast dye may be injected into the facet joint area to help locate the joint.  When the joint is located, an anesthetic will be injected into the joint through the needle.  Your health care provider  will ask you whether you feel pain relief. If you do feel relief, a steroid may be injected to provide pain relief for a longer period of time. If you do  not feel relief or feel only partial relief, additional injections of an anesthetic may be made in other facet joints.  The needle will be removed.  Your skin will be cleaned.  A bandage (dressing) will be applied over each injection site. The procedure may vary among health care providers and hospitals. What happens after the procedure?  You will be observed for 15-30 minutes before being allowed to go home. This information is not intended to replace advice given to you by your health care provider. Make sure you discuss any questions you have with your health care provider. Document Released: 09/19/2006 Document Revised: 06/01/2015 Document Reviewed: 01/24/2015 Elsevier Interactive Patient Education  [employer].

## 2017-02-01 ENCOUNTER — Telehealth: Payer: Self-pay | Admitting: Student in an Organized Health Care Education/Training Program

## 2017-02-01 NOTE — Telephone Encounter (Signed)
Patient's insurance has denied procedure, she is very discouraged. Is there anything that can be done to help get this approved as she is leaving on a mission trip to [place] Oct 15 - Oct 27. She really would like to have tx for pain. I did explain Dr. Holley Raring is back in office on Monday and she is ok with this.

## 2017-02-01 NOTE — Telephone Encounter (Signed)
Routed to Dr. Lateef. 

## 2017-02-04 ENCOUNTER — Ambulatory Visit: Payer: Managed Care, Other (non HMO) | Admitting: Student in an Organized Health Care Education/Training Program

## 2017-02-06 ENCOUNTER — Encounter: Payer: Self-pay | Admitting: Student in an Organized Health Care Education/Training Program

## 2017-02-06 ENCOUNTER — Ambulatory Visit (HOSPITAL_BASED_OUTPATIENT_CLINIC_OR_DEPARTMENT_OTHER): Payer: Managed Care, Other (non HMO) | Admitting: Student in an Organized Health Care Education/Training Program

## 2017-02-06 ENCOUNTER — Ambulatory Visit
Admission: RE | Admit: 2017-02-06 | Discharge: 2017-02-06 | Disposition: A | Discharge status: Home / Self Care | Payer: Managed Care, Other (non HMO) | Source: Ambulatory Visit | Attending: Student in an Organized Health Care Education/Training Program | Admitting: Student in an Organized Health Care Education/Training Program

## 2017-02-06 VITALS — BP 138/75 | HR 60 | Temp 97.7°F | Resp 18 | Ht 62.0 in | Wt 230.0 lb

## 2017-02-06 DIAGNOSIS — M5136 Other intervertebral disc degeneration, lumbar region: Secondary | ICD-10-CM

## 2017-02-06 DIAGNOSIS — M4696 Unspecified inflammatory spondylopathy, lumbar region: Secondary | ICD-10-CM

## 2017-02-06 DIAGNOSIS — M47816 Spondylosis without myelopathy or radiculopathy, lumbar region: Secondary | ICD-10-CM | POA: Insufficient documentation

## 2017-02-06 DIAGNOSIS — G894 Chronic pain syndrome: Secondary | ICD-10-CM | POA: Diagnosis not present

## 2017-02-06 DIAGNOSIS — M545 Low back pain: Secondary | ICD-10-CM

## 2017-02-06 DIAGNOSIS — M4306 Spondylolysis, lumbar region: Secondary | ICD-10-CM

## 2017-02-06 DIAGNOSIS — G8929 Other chronic pain: Secondary | ICD-10-CM

## 2017-02-06 DIAGNOSIS — M79604 Pain in right leg: Secondary | ICD-10-CM | POA: Diagnosis present

## 2017-02-06 MED ORDER — ROPIVACAINE HCL 2 MG/ML IJ SOLN
INTRAMUSCULAR | Status: AC
  Filled 2017-02-06: qty 10

## 2017-02-06 MED ORDER — ROPIVACAINE HCL 2 MG/ML IJ SOLN
10.0000 mL | Freq: Once | INTRAMUSCULAR | Status: AC
  Administered 2017-02-06: 10 mL
  Filled 2017-02-06: qty 10

## 2017-02-06 MED ORDER — LACTATED RINGERS IV SOLN
1000.0000 mL | Freq: Once | INTRAVENOUS | Status: AC
  Administered 2017-02-06: 1000 mL via INTRAVENOUS

## 2017-02-06 MED ORDER — LIDOCAINE HCL (PF) 1 % IJ SOLN
10.0000 mL | Freq: Once | INTRAMUSCULAR | Status: AC
  Administered 2017-02-06: 5 mL
  Filled 2017-02-06: qty 10

## 2017-02-06 MED ORDER — FENTANYL CITRATE (PF) 100 MCG/2ML IJ SOLN
25.0000 ug | INTRAMUSCULAR | Status: DC | PRN
  Administered 2017-02-06: 25 ug via INTRAVENOUS
  Filled 2017-02-06: qty 2

## 2017-02-06 NOTE — Progress Notes (Signed)
Safety precautions to be maintained throughout the outpatient stay will include: orient to surroundings, keep bed in low position, maintain call bell within reach at all times, provide assistance with transfer out of bed and ambulation.  

## 2017-02-06 NOTE — Progress Notes (Signed)
Patient's Name: Terri Werner  MRN: [id number]  Referring Provider: Edward Jolly, MD  DOB: 07-07-1950  PCP: Leotis Shames, MD  DOS: 02/06/2017  Note by: Edward Jolly, MD  Service setting: Ambulatory outpatient  Specialty: Interventional Pain Management  Patient type: Established  Location: [facility] (AMB) Pain Management Facility  Visit type: Interventional Procedure   Primary Reason for Visit: Interventional Pain Management Treatment. CC: Back Pain (low and bilateral) and Leg Pain (right)  Procedure:  Anesthesia, Analgesia, Anxiolysis:  Type: Diagnostic Medial Branch Facet Block Region: Lumbar Level:L3, L4, L5, Medial Branch Level(s) Laterality: Bilateral  Type: Local Anesthesia with Moderate (Conscious) Sedation Local Anesthetic: Lidocaine 1% Route: Intravenous (IV) IV Access: Secured Sedation: Meaningful verbal contact was maintained at all times during the procedure  Indication(s): Analgesia and Anxiety  Indications: 1. Lumbar degenerative disc disease   2. Lumbar spondylolysis   3. Lumbar facet arthropathy (HCC)   4. Chronic low back pain, unspecified back pain laterality, with sciatica presence unspecified   5. Chronic pain syndrome    Pain Score: Pre-procedure: 7 /10 Post-procedure: 0-No pain/10  Pre-op Assessment:  Terri Werner is a 66 y.o. (year old), female patient, seen today for interventional treatment. She  has a past surgical history that includes Tonsillectomy; Vesicovaginal fistula closure w/ TAH; Cesarean section; laproscopic Left knee repair (1971); Ectopic pregnancy surgery; Total abdominal hysterectomy w/ bilateral salpingoophorectomy (2006); Knee arthroscopy; Appendectomy (1980); Abdominal hysterectomy; Hernia repair; and Colonoscopy with propofol (N/A, 11/01/2014). Terri Werner has a current medication list which includes the following prescription(s): amlodipine, aspirin, vitamin d, diclofenac sodium, fluticasone, gabapentin,  hydrocodone-acetaminophen, ibuprofen, lidocaine, magnesium, meloxicam, naproxen sodium, nystatin cream, pravastatin, tizanidine, and tramadol, and the following Facility-Administered Medications: fentanyl. Her primarily concern today is the Back Pain (low and bilateral) and Leg Pain (right)  Initial Vital Signs: Blood pressure 138/75, pulse 60, temperature 97.7 F (36.5 C), resp. rate 18, height 5\' 2"  (1.575 m), weight 230 lb (104.3 kg), SpO2 97 %. BMI: Estimated body mass index is 42.07 kg/m as calculated from the following:   Height as of this encounter: 5\' 2"  (1.575 m).   Weight as of this encounter: 230 lb (104.3 kg).  Risk Assessment: Allergies: Reviewed. She is allergic to latex; lisinopril; and shrimp [shellfish allergy].  Allergy Precautions: None required Coagulopathies: Reviewed. None identified.  Blood-thinner therapy: None at this time Active Infection(s): Reviewed. None identified. Terri Werner is afebrile  Site Confirmation: Terri Werner was asked to confirm the procedure and laterality before marking the site Procedure checklist: Completed Consent: Before the procedure and under the influence of no sedative(s), amnesic(s), or anxiolytics, the patient was informed of the treatment options, risks and possible complications. To fulfill our ethical and legal obligations, as recommended by the American Medical Association's Code of Ethics, I have informed the patient of my clinical impression; the nature and purpose of the treatment or procedure; the risks, benefits, and possible complications of the intervention; the alternatives, including doing nothing; the risk(s) and benefit(s) of the alternative treatment(s) or procedure(s); and the risk(s) and benefit(s) of doing nothing. The patient was provided information about the general risks and possible complications associated with the procedure. These may include, but are not limited to: failure to achieve desired goals,  infection, bleeding, organ or nerve damage, allergic reactions, paralysis, and death. In addition, the patient was informed of those risks and complications associated to Spine-related procedures, such as failure to decrease pain; infection (i.e.: Meningitis, epidural or intraspinal abscess); bleeding (i.e.: epidural hematoma, subarachnoid hemorrhage, or  any other type of intraspinal or peri-dural bleeding); organ or nerve damage (i.e.: Any type of peripheral nerve, nerve root, or spinal cord injury) with subsequent damage to sensory, motor, and/or autonomic systems, resulting in permanent pain, numbness, and/or weakness of one or several areas of the body; allergic reactions; (i.e.: anaphylactic reaction); and/or death. Furthermore, the patient was informed of those risks and complications associated with the medications. These include, but are not limited to: allergic reactions (i.e.: anaphylactic or anaphylactoid reaction(s)); adrenal axis suppression; blood sugar elevation that in diabetics may result in ketoacidosis or comma; water retention that in patients with history of congestive heart failure may result in shortness of breath, pulmonary edema, and decompensation with resultant heart failure; weight gain; swelling or edema; medication-induced neural toxicity; particulate matter embolism and blood vessel occlusion with resultant organ, and/or nervous system infarction; and/or aseptic necrosis of one or more joints. Finally, the patient was informed that Medicine is not an exact science; therefore, there is also the possibility of unforeseen or unpredictable risks and/or possible complications that may result in a catastrophic outcome. The patient indicated having understood very clearly. We have given the patient no guarantees and we have made no promises. Enough time was given to the patient to ask questions, all of which were answered to the patient's satisfaction. Terri Werner has indicated  that she wanted to continue with the procedure. Attestation: I, the ordering provider, attest that I have discussed with the patient the benefits, risks, side-effects, alternatives, likelihood of achieving goals, and potential problems during recovery for the procedure that I have provided informed consent. Date: 02/06/2017; Time: 12:19 PM  Pre-Procedure Preparation:  Monitoring: As per clinic protocol. Respiration, ETCO2, SpO2, BP, heart rate and rhythm monitor placed and checked for adequate function Safety Precautions: Patient was assessed for positional comfort and pressure points before starting the procedure. Time-out: I initiated and conducted the "Time-out" before starting the procedure, as per protocol. The patient was asked to participate by confirming the accuracy of the "Time Out" information. Verification of the correct person, site, and procedure were performed and confirmed by me, the nursing staff, and the patient. "Time-out" conducted as per Joint Commission's Universal Protocol (UP.01.01.01). "Time-out" Date & Time: 02/06/2017; 1044 hrs.  Description of Procedure Process:   Position: Prone Target Area: For Lumbar Facet blocks, the target is the groove formed by the junction of the transverse process and superior articular process. For the L5 dorsal ramus, the target is the notch between superior articular process and sacral ala.  Approach: Paramedial approach. Area Prepped: Entire Posterior Lumbosacral Region Prepping solution: ChloraPrep (2% chlorhexidine gluconate and 70% isopropyl alcohol) Safety Precautions: Aspiration looking for blood return was conducted prior to all injections. At no point did we inject any substances, as a needle was being advanced. No attempts were made at seeking any paresthesias. Safe injection practices and needle disposal techniques used. Medications properly checked for expiration dates. SDV (single dose vial) medications used. Description of the  Procedure: Protocol guidelines were followed. The patient was placed in position over the fluoroscopy table. The target area was identified and the area prepped in the usual manner. Skin desensitized using vapocoolant spray. Skin & deeper tissues infiltrated with local anesthetic. Appropriate amount of time allowed to pass for local anesthetics to take effect. The procedure needle was introduced through the skin, ipsilateral to the reported pain, and advanced to the target area. Employing the "Medial Branch Technique", the needles were advanced to the angle made by the superior  and medial portion of the transverse process, and the lateral and inferior portion of the superior articulating process of the targeted vertebral bodies. This area is known as "Burton's Eye" or the "Eye of the [place] Dog". A procedure needle was introduced through the skin, and this time advanced to the angle made by the superior and medial border of the sacral ala, and the lateral border of the S1 vertebral body.  Negative aspiration confirmed. Solution injected in intermittent fashion, asking for systemic symptoms every 0.5cc of injectate. The needles were then removed and the area cleansed, making sure to leave some of the prepping solution back to take advantage of its long term bactericidal properties.   Illustration of the posterior view of the lumbar spine and the posterior neural structures. Laminae of L2 through S1 are labeled. DPRL5, dorsal primary ramus of L5; DPRS1, dorsal primary ramus of S1; DPR3, dorsal primary ramus of L3; FJ, facet (zygapophyseal) joint L3-L4; I, inferior articular process of L4; LB1, lateral branch of dorsal primary ramus of L1; IAB, inferior articular branches from L3 medial branch (supplies L4-L5 facet joint); IBP, intermediate branch plexus; MB3, medial branch of dorsal primary ramus of L3; NR3, third lumbar nerve root; S, superior articular process of L5; SAB, superior articular branches from L4  (supplies L4-5 facet joint also); TP3, transverse process of L3.  Vitals:   02/06/17 1103 02/06/17 1113 02/06/17 1123 02/06/17 1133  BP: 129/69 132/64 137/81 138/75  Pulse: (!) 58 (!) 56 62 60  Resp: 15 18 14 18   Temp:  98 F (36.7 C)  97.7 F (36.5 C)  TempSrc:      SpO2: 100% 98% 99% 97%  Weight:      Height:        Start Time: 1045 hrs. End Time: 1102 hrs. Materials:  Needle(s) Type: Regular needle Gauge: 22G Length: 3.5-in Medication(s): We administered lactated ringers, fentaNYL, lidocaine (PF), and ropivacaine (PF) 2 mg/mL (0.2%). Please see chart orders for dosing details. 1.5 cc of 0.2% ropivacaine at each level. Imaging Guidance (Spinal):  Type of Imaging Technique: Fluoroscopy Guidance (Spinal) Indication(s): Assistance in needle guidance and placement for procedures requiring needle placement in or near specific anatomical locations not easily accessible without such assistance. Exposure Time: Please see nurses notes. Contrast: None used. Fluoroscopic Guidance: I was personally present during the use of fluoroscopy. "Tunnel Vision Technique" used to obtain the best possible view of the target area. Parallax error corrected before commencing the procedure. "Direction-depth-direction" technique used to introduce the needle under continuous pulsed fluoroscopy. Once target was reached, antero-posterior, oblique, and lateral fluoroscopic projection used confirm needle placement in all planes. Images permanently stored in EMR. Interpretation: No contrast injected. I personally interpreted the imaging intraoperatively. Adequate needle placement confirmed in multiple planes. Permanent images saved into the patient's record.  Antibiotic Prophylaxis:  Indication(s): None identified Antibiotic given: None  Post-operative Assessment:  EBL: None Complications: No immediate post-treatment complications observed by team, or reported by patient. Note: The patient tolerated the entire  procedure well. A repeat set of vitals were taken after the procedure and the patient was kept under observation following institutional policy, for this type of procedure. Post-procedural neurological assessment was performed, showing return to baseline, prior to discharge. The patient was provided with post-procedure discharge instructions, including a section on how to identify potential problems. Should any problems arise concerning this procedure, the patient was given instructions to immediately contact us, at any time, without hesitation. In any case, we plan to  contact the patient by telephone for a follow-up status report regarding this interventional procedure. Comments:  No additional relevant information.  Plan of Care  Disposition: Discharge home  Discharge Date & Time: 02/06/2017; 1140 hrs.  5 out of 5 strength bilateral lower extremity upon discharge: Plantar flexion, dorsiflexion, knee flexion, knee extension.  Physician-requested Follow-up:  Return in about 1 week (around 02/13/2017) for Post Procedure Evaluation.  Future Appointments Date Time Provider Department Center  02/14/2017 11:00 AM Edward Jolly, MD [facility] None  03/19/2017 9:30 AM Edward Jolly, MD [facility] None  02/07/2018 1:00 PM [facility] 3 [facility] None  02/07/2018 2:00 PM Dew, Marlow Baars, MD [facility] None    Imaging Orders     DG C-Arm 1-60 Min-No Report Procedure Orders    No procedure(s) ordered today    Medications ordered for procedure: Meds ordered this encounter  Medications  . lactated ringers infusion 1,000 mL  . fentaNYL (SUBLIMAZE) injection 25-50 mcg    Make sure Narcan is available in the pyxis when using this medication. In the event of respiratory depression (RR< 8/min): Titrate NARCAN (naloxone) in increments of 0.1 to 0.2 mg IV at 2-3 minute intervals, until desired degree of reversal.  . lidocaine (PF) (XYLOCAINE) 1 % injection 10 mL  . ropivacaine (PF) 2 mg/mL (0.2%) (NAROPIN) injection  10 mL   Medications administered: We administered lactated ringers, fentaNYL, lidocaine (PF), and ropivacaine (PF) 2 mg/mL (0.2%).  See the medical record for exact dosing, route, and time of administration.  New Prescriptions   No medications on file   Primary Care Physician: Leotis Shames, MD Location: [facility] Note by: Edward Jolly, MD Date: 02/06/2017; Time: 12:24 PM  Disclaimer:  Medicine is not an exact science. The only guarantee in medicine is that nothing is guaranteed. It is important to note that the decision to proceed with this intervention was based on the information collected from the patient. The Data and conclusions were drawn from the patient's questionnaire, the interview, and the physical examination. Because the information was provided in large part by the patient, it cannot be guaranteed that it has not been purposely or unconsciously manipulated. Every effort has been made to obtain as much relevant data as possible for this evaluation. It is important to note that the conclusions that lead to this procedure are derived in large part from the available data. Always take into account that the treatment will also be dependent on availability of resources and existing treatment guidelines, considered by other Pain Management Practitioners as being common knowledge and practice, at the time of the intervention. For Medico-Legal purposes, it is also important to point out that variation in procedural techniques and pharmacological choices are the acceptable norm. The indications, contraindications, technique, and results of the above procedure should only be interpreted and judged by a Board-Certified Interventional Pain Specialist with extensive familiarity and expertise in the same exact procedure and technique.

## 2017-02-06 NOTE — Patient Instructions (Signed)
Facet Blocks Patient Information  Description: The facets are joints in the spine between the vertebrae.  Like any joints in the body, facets can become irritated and painful.  Arthritis can also effect the facets.  By injecting steroids and local anesthetic in and around these joints, we can temporarily block the nerve supply to them.  Steroids act directly on irritated nerves and tissues to reduce selling and inflammation which often leads to decreased pain.  Facet blocks may be done anywhere along the spine from the neck to the low back depending upon the location of your pain.   After numbing the skin with local anesthetic (like Novocaine), a small needle is passed onto the facet joints under x-ray guidance.  You may experience a sensation of pressure while this is being done.  The entire block usually lasts about 15-25 minutes.   Conditions which may be treated by facet blocks:   Low back/buttock pain  Neck/shoulder pain  Certain types of headaches  Preparation for the injection:  1. Do not eat any solid food or dairy products within 8 hours of your appointment. 2. You may drink clear liquid up to 3 hours before appointment.  Clear liquids include water, black coffee, juice or soda.  No milk or cream please. 3. You may take your regular medication, including pain medications, with a sip of water before your appointment.  Diabetics should hold regular insulin (if taken separately) and take 1/2 normal NPH dose the morning of the procedure.  Carry some sugar containing items with you to your appointment. 4. A driver must accompany you and be prepared to drive you home after your procedure. 5. Bring all your current medications with you. 6. An IV may be inserted and sedation may be given at the discretion of the physician. 7. A blood pressure cuff, EKG and other monitors will often be applied during the procedure.  Some patients may need to have extra oxygen administered for a short  period. 8. You will be asked to provide medical information, including your allergies and medications, prior to the procedure.  We must know immediately if you are taking blood thinners (like Coumadin/Warfarin) or if you are allergic to IV iodine contrast (dye).  We must know if you could possible be pregnant.  Possible side-effects:   Bleeding from needle site  Infection (rare, may require surgery)  Nerve injury (rare)  Numbness & tingling (temporary)  Difficulty urinating (rare, temporary)  Spinal headache (a headache worse with upright posture)  Light-headedness (temporary)  Pain at injection site (serveral days)  Decreased blood pressure (rare, temporary)  Weakness in arm/leg (temporary)  Pressure sensation in back/neck (temporary)   Call if you experience:   Fever/chills associated with headache or increased back/neck pain  Headache worsened by an upright position  New onset, weakness or numbness of an extremity below the injection site  Hives or difficulty breathing (go to the emergency room)  Inflammation or drainage at the injection site(s)  Severe back/neck pain greater than usual  New symptoms which are concerning to you  Please note:  Although the local anesthetic injected can often make your back or neck feel good for several hours after the injection, the pain will likely return. It takes 3-7 days for steroids to work.  You may not notice any pain relief for at least one week.  If effective, we will often do a series of 2-3 injections spaced 3-6 weeks apart to maximally decrease your pain.  After the initial   series, you may be a candidate for a more permanent nerve block of the facets.  If you have any questions, please call [phone] [facility]  What are the risk, side effects and possible complications? Generally speaking, most procedures are safe.  However, with any procedure  there are risks, side effects, and the possibility of complications.  The risks and complications are dependent upon the sites that are lesioned, or the type of nerve block to be performed.  The closer the procedure is to the spine, the more serious the risks are.  Great care is taken when placing the radio frequency needles, block needles or lesioning probes, but sometimes complications can occur. 1. Infection: Any time there is an injection through the skin, there is a risk of infection.  This is why sterile conditions are used for these blocks.  There are four possible types of infection. 1. Localized skin infection. 2. Central Nervous System Infection-This can be in the form of Meningitis, which can be deadly. 3. Epidural Infections-This can be in the form of an epidural abscess, which can cause pressure inside of the spine, causing compression of the spinal cord with subsequent paralysis. This would require an emergency surgery to decompress, and there are no guarantees that the patient would recover from the paralysis. 4. Discitis-This is an infection of the intervertebral discs.  It occurs in about 1% of discography procedures.  It is difficult to treat and it may lead to surgery.        2. Pain: the needles have to go through skin and soft tissues, will cause soreness.       3. Damage to internal structures:  The nerves to be lesioned may be near blood vessels or    other nerves which can be potentially damaged.       4. Bleeding: Bleeding is more common if the patient is taking blood thinners such as  aspirin, Coumadin, Ticiid, Plavix, etc., or if he/she have some genetic predisposition  such as hemophilia. Bleeding into the spinal canal can cause compression of the spinal  cord with subsequent paralysis.  This would require an emergency surgery to  decompress and there are no guarantees that the patient would recover from the  paralysis.       5. Pneumothorax:  Puncturing of a lung is a  possibility, every time a needle is introduced in  the area of the chest or upper back.  Pneumothorax refers to free air around the  collapsed lung(s), inside of the thoracic cavity (chest cavity).  Another two possible  complications related to a similar event would include: Hemothorax and Chylothorax.   These are variations of the Pneumothorax, where instead of air around the collapsed  lung(s), you may have blood or chyle, respectively.       6. Spinal headaches: They may occur with any procedures in the area of the spine.       7. Persistent CSF (Cerebro-Spinal Fluid) leakage: This is a rare problem, but may occur  with prolonged intrathecal or epidural catheters either due to the formation of a fistulous  track or a dural tear.       8. Nerve damage: By working so close to the spinal cord, there is always a possibility of  nerve damage, which could be as serious as a permanent spinal cord injury with  paralysis.       9. Death:  Although rare, severe deadly allergic   reactions known as "Anaphylactic  reaction" can occur to any of the medications used.      10. Worsening of the symptoms:  We can always make thing worse.  What are the chances of something like this happening? Chances of any of this occuring are extremely low.  By statistics, you have more of a chance of getting killed in a motor vehicle accident: while driving to the hospital than any of the above occurring .  Nevertheless, you should be aware that they are possibilities.  In general, it is similar to taking a shower.  Everybody knows that you can slip, hit your head and get killed.  Does that mean that you should not shower again?  Nevertheless always keep in mind that statistics do not mean anything if you happen to be on the wrong side of them.  Even if a procedure has a 1 (one) in a 1,000,000 (million) chance of going wrong, it you happen to be that one..Also, keep in mind that by statistics, you have more of a chance of having  something go wrong when taking medications.  Who should not have this procedure? If you are on a blood thinning medication (e.g. Coumadin, Plavix, see list of "Blood Thinners"), or if you have an active infection going on, you should not have the procedure.  If you are taking any blood thinners, please inform your physician.  How should I prepare for this procedure?  Do not eat or drink anything at least six hours prior to the procedure.  Bring a driver with you .  It cannot be a taxi.  Come accompanied by an adult that can drive you back, and that is strong enough to help you if your legs get weak or numb from the local anesthetic.  Take all of your medicines the morning of the procedure with just enough water to swallow them.  If you have diabetes, make sure that you are scheduled to have your procedure done first thing in the morning, whenever possible.  If you have diabetes, take only half of your insulin dose and notify our nurse that you have done so as soon as you arrive at the clinic.  If you are diabetic, but only take blood sugar pills (oral hypoglycemic), then do not take them on the morning of your procedure.  You may take them after you have had the procedure.  Do not take aspirin or any aspirin-containing medications, at least eleven (11) days prior to the procedure.  They may prolong bleeding.  Wear loose fitting clothing that may be easy to take off and that you would not mind if it got stained with Betadine or blood.  Do not wear any jewelry or perfume  Remove any nail coloring.  It will interfere with some of our monitoring equipment.  NOTE: Remember that this is not meant to be interpreted as a complete list of all possible complications.  Unforeseen problems may occur.  BLOOD THINNERS The following drugs contain aspirin or other products, which can cause increased bleeding during surgery and should not be taken for 2 weeks prior to and 1 week after surgery.  If you  should need take something for relief of minor pain, you may take acetaminophen which is found in Tylenol,m Datril, Anacin-3 and Panadol. It is not blood thinner. The products listed below are.  Do not take any of the products listed below in addition to any listed on your instruction sheet.  A.P.C or A.P.C with   Codeine Codeine Phosphate Capsules #3 Ibuprofen Ridaura  ABC compound Congesprin Imuran rimadil  Advil Cope Indocin Robaxisal  Alka-Seltzer Effervescent Pain Reliever and Antacid Coricidin or Coricidin-D  Indomethacin Rufen  Alka-Seltzer plus Cold Medicine Cosprin Ketoprofen S-A-C Tablets  Anacin Analgesic Tablets or Capsules Coumadin Korlgesic Salflex  Anacin Extra Strength Analgesic tablets or capsules CP-2 Tablets Lanoril Salicylate  Anaprox Cuprimine Capsules Levenox Salocol  Anexsia-D Dalteparin Magan Salsalate  Anodynos Darvon compound Magnesium Salicylate Sine-off  Ansaid Dasin Capsules Magsal Sodium Salicylate  Anturane Depen Capsules Marnal Soma  APF Arthritis pain formula Dewitt's Pills Measurin Stanback  Argesic Dia-Gesic Meclofenamic Sulfinpyrazone  Arthritis Bayer Timed Release Aspirin Diclofenac Meclomen Sulindac  Arthritis pain formula Anacin Dicumarol Medipren Supac  Analgesic (Safety coated) Arthralgen Diffunasal Mefanamic Suprofen  Arthritis Strength Bufferin Dihydrocodeine Mepro Compound Suprol  Arthropan liquid Dopirydamole Methcarbomol with Aspirin Synalgos  ASA tablets/Enseals Disalcid Micrainin Tagament  Ascriptin Doan's Midol Talwin  Ascriptin A/D Dolene Mobidin Tanderil  Ascriptin Extra Strength Dolobid Moblgesic Ticlid  Ascriptin with Codeine Doloprin or Doloprin with Codeine Momentum Tolectin  Asperbuf Duoprin Mono-gesic Trendar  Aspergum Duradyne Motrin or Motrin IB Triminicin  Aspirin plain, buffered or enteric coated Durasal Myochrisine Trigesic  Aspirin Suppositories Easprin Nalfon Trillsate  Aspirin with Codeine Ecotrin Regular or Extra Strength  Naprosyn Uracel  Atromid-S Efficin Naproxen Ursinus  Auranofin Capsules Elmiron Neocylate Vanquish  Axotal Emagrin Norgesic Verin  Azathioprine Empirin or Empirin with Codeine Normiflo Vitamin E  Azolid Emprazil Nuprin Voltaren  Bayer Aspirin plain, buffered or children's or timed BC Tablets or powders Encaprin Orgaran Warfarin Sodium  Buff-a-Comp Enoxaparin Orudis Zorpin  Buff-a-Comp with Codeine Equegesic Os-Cal-Gesic   Buffaprin Excedrin plain, buffered or Extra Strength Oxalid   Bufferin Arthritis Strength Feldene Oxphenbutazone   Bufferin plain or Extra Strength Feldene Capsules Oxycodone with Aspirin   Bufferin with Codeine Fenoprofen Fenoprofen Pabalate or Pabalate-SF   Buffets II Flogesic Panagesic   Buffinol plain or Extra Strength Florinal or Florinal with Codeine Panwarfarin   Buf-Tabs Flurbiprofen Penicillamine   Butalbital Compound Four-way cold tablets Penicillin   Butazolidin Fragmin Pepto-Bismol   Carbenicillin Geminisyn Percodan   Carna Arthritis Reliever Geopen Persantine   Carprofen Gold's salt Persistin   Chloramphenicol Goody's Phenylbutazone   Chloromycetin Haltrain Piroxlcam   Clmetidine heparin Plaquenil   Cllnoril Hyco-pap Ponstel   Clofibrate Hydroxy chloroquine Propoxyphen         Before stopping any of these medications, be sure to consult the physician who ordered them.  Some, such as Coumadin (Warfarin) are ordered to prevent or treat serious conditions such as "deep thrombosis", "pumonary embolisms", and other heart problems.  The amount of time that you may need off of the medication may also vary with the medication and the reason for which you were taking it.  If you are taking any of these medications, please make sure you notify your pain physician before you undergo any procedures.         Pain Management Discharge Instructions  General Discharge Instructions :  If you need to reach your doctor call: Monday-Friday 8:00 am - 4:00 pm at  [phone] or toll free [phone].  After clinic hours [phone] to have operator reach doctor.  Bring all of your medication bottles to all your appointments in the pain clinic.  To cancel or reschedule your appointment with Pain Management please remember to call 24 hours in advance to avoid a fee.  Refer to the educational materials which you have been given on: General Risks, I  had my Procedure. Discharge Instructions, Post Sedation.  Post Procedure Instructions:  The drugs you were given will stay in your system until tomorrow, so for the next 24 hours you should not drive, make any legal decisions or drink any alcoholic beverages.  You may eat anything you prefer, but it is better to start with liquids then soups and crackers, and gradually work up to solid foods.  Please notify your doctor immediately if you have any unusual bleeding, trouble breathing or pain that is not related to your normal pain.  Depending on the type of procedure that was done, some parts of your body may feel week and/or numb.  This usually clears up by tonight or the next day.  Walk with the use of an assistive device or accompanied by an adult for the 24 hours.  You may use ice on the affected area for the first 24 hours.  Put ice in a Ziploc bag and cover with a towel and place against area 15 minutes on 15 minutes off.  You may switch to heat after 24 hours.

## 2017-02-07 ENCOUNTER — Telehealth: Payer: Self-pay

## 2017-02-07 NOTE — Telephone Encounter (Signed)
Post procerdure phone call .  Patient states she is doing ok.

## 2017-02-14 ENCOUNTER — Encounter: Payer: Self-pay | Admitting: Student in an Organized Health Care Education/Training Program

## 2017-02-14 ENCOUNTER — Ambulatory Visit
Payer: Managed Care, Other (non HMO) | Attending: Student in an Organized Health Care Education/Training Program | Admitting: Student in an Organized Health Care Education/Training Program

## 2017-02-14 VITALS — BP 154/88 | HR 72 | Temp 98.2°F | Resp 18 | Ht 61.0 in | Wt 230.0 lb

## 2017-02-14 DIAGNOSIS — K219 Gastro-esophageal reflux disease without esophagitis: Secondary | ICD-10-CM | POA: Diagnosis not present

## 2017-02-14 DIAGNOSIS — Z9889 Other specified postprocedural states: Secondary | ICD-10-CM | POA: Diagnosis not present

## 2017-02-14 DIAGNOSIS — M5136 Other intervertebral disc degeneration, lumbar region: Secondary | ICD-10-CM | POA: Diagnosis not present

## 2017-02-14 DIAGNOSIS — M545 Low back pain, unspecified: Secondary | ICD-10-CM

## 2017-02-14 DIAGNOSIS — Z8249 Family history of ischemic heart disease and other diseases of the circulatory system: Secondary | ICD-10-CM | POA: Diagnosis not present

## 2017-02-14 DIAGNOSIS — M5137 Other intervertebral disc degeneration, lumbosacral region: Secondary | ICD-10-CM | POA: Diagnosis not present

## 2017-02-14 DIAGNOSIS — M51379 Other intervertebral disc degeneration, lumbosacral region without mention of lumbar back pain or lower extremity pain: Secondary | ICD-10-CM

## 2017-02-14 DIAGNOSIS — G8929 Other chronic pain: Secondary | ICD-10-CM

## 2017-02-14 DIAGNOSIS — K449 Diaphragmatic hernia without obstruction or gangrene: Secondary | ICD-10-CM | POA: Diagnosis not present

## 2017-02-14 DIAGNOSIS — E876 Hypokalemia: Secondary | ICD-10-CM | POA: Insufficient documentation

## 2017-02-14 DIAGNOSIS — M47816 Spondylosis without myelopathy or radiculopathy, lumbar region: Secondary | ICD-10-CM | POA: Diagnosis not present

## 2017-02-14 DIAGNOSIS — M4306 Spondylolysis, lumbar region: Secondary | ICD-10-CM | POA: Diagnosis not present

## 2017-02-14 DIAGNOSIS — Z803 Family history of malignant neoplasm of breast: Secondary | ICD-10-CM | POA: Insufficient documentation

## 2017-02-14 DIAGNOSIS — K5732 Diverticulitis of large intestine without perforation or abscess without bleeding: Secondary | ICD-10-CM | POA: Insufficient documentation

## 2017-02-14 DIAGNOSIS — Z86010 Personal history of colonic polyps: Secondary | ICD-10-CM | POA: Insufficient documentation

## 2017-02-14 DIAGNOSIS — G894 Chronic pain syndrome: Secondary | ICD-10-CM | POA: Diagnosis not present

## 2017-02-14 DIAGNOSIS — Z79899 Other long term (current) drug therapy: Secondary | ICD-10-CM | POA: Insufficient documentation

## 2017-02-14 DIAGNOSIS — Z7982 Long term (current) use of aspirin: Secondary | ICD-10-CM | POA: Diagnosis not present

## 2017-02-14 DIAGNOSIS — E785 Hyperlipidemia, unspecified: Secondary | ICD-10-CM | POA: Insufficient documentation

## 2017-02-14 DIAGNOSIS — Z79891 Long term (current) use of opiate analgesic: Secondary | ICD-10-CM | POA: Diagnosis not present

## 2017-02-14 DIAGNOSIS — Z808 Family history of malignant neoplasm of other organs or systems: Secondary | ICD-10-CM | POA: Diagnosis not present

## 2017-02-14 DIAGNOSIS — I10 Essential (primary) hypertension: Secondary | ICD-10-CM | POA: Insufficient documentation

## 2017-02-14 DIAGNOSIS — M5441 Lumbago with sciatica, right side: Secondary | ICD-10-CM | POA: Diagnosis not present

## 2017-02-14 DIAGNOSIS — Z90710 Acquired absence of both cervix and uterus: Secondary | ICD-10-CM | POA: Diagnosis not present

## 2017-02-14 DIAGNOSIS — M51369 Other intervertebral disc degeneration, lumbar region without mention of lumbar back pain or lower extremity pain: Secondary | ICD-10-CM

## 2017-02-14 NOTE — Progress Notes (Signed)
Patient's Name: Terri Werner  MRN: [id number]  Referring Provider: Leotis Shames, MD  DOB: 31-Jan-1951  PCP: Leotis Shames, MD  DOS: 02/14/2017  Note by: Edward Jolly, MD  Service setting: Ambulatory outpatient  Specialty: Interventional Pain Management  Location: [facility] (AMB) Pain Management Facility    Patient type: Established   Primary Reason(s) for Visit: Encounter for post-procedure evaluation of chronic illness with mild to moderate exacerbation CC: Back Pain (lower)  HPI  Terri Werner is a 66 y.o. year old, female patient, who comes today for a post-procedure evaluation. She has HYPERTENSION, BENIGN; Screening for breast cancer; Arthralgia; Routine general medical examination at a health care facility; Hyperlipidemia; Carotid artery disorder (HCC); Diverticulitis of colon; and Lumbago on her problem list. Her primarily concern today is the Back Pain (lower)  Pain Assessment: Location: Lower Back Radiating: both upper legs Onset: More than a month ago Duration: Chronic pain Quality: Dull Severity: 3 /10 (self-reported pain score)  Note: Reported level is compatible with observation.                   When using our objective Pain Scale, levels between 6 and 10/10 are said to belong in an emergency room, as it progressively worsens from a 6/10, described as severely limiting, requiring emergency care not usually available at an outpatient pain management facility. At a 6/10 level, communication becomes difficult and requires great effort. Assistance to reach the emergency department may be required. Facial flushing and profuse sweating along with potentially dangerous increases in heart rate and blood pressure will be evident. Effect on ADL:   Timing: Intermittent  Modifying factors: moving about, massage  Terri Werner comes in today for post-procedure evaluation after the treatment done on 02/06/2017.  Further details on both, my assessment(s), as well as  the proposed treatment plan, please see below.  Post-Procedure Assessment  02/06/2017 Procedure: bilateral L3/L4, L4/L5, L5/S1 facet blocks Pre-procedure pain score:  7/10 Post-procedure pain score: 0/10 (100% relief) Influential Factors: BMI: 43.46 kg/m Intra-procedural challenges: None observed.         Assessment challenges: None detected.              Reported side-effects: None.        Post-procedural adverse reactions or complications: None reported         Sedation: Please see nurses note. When no sedatives are used, the analgesic levels obtained are directly associated to the effectiveness of the local anesthetics. However, when sedation is provided, the level of analgesia obtained during the initial 1 hour following the intervention, is believed to be the result of a combination of factors. These factors may include, but are not limited to: 1. The effectiveness of the local anesthetics used. 2. The effects of the analgesic(s) and/or anxiolytic(s) used. 3. The degree of discomfort experienced by the patient at the time of the procedure. 4. The patients ability and reliability in recalling and recording the events. 5. The presence and influence of possible secondary gains and/or psychosocial factors. Reported result: Relief experienced during the 1st hour after the procedure: 100 % (Ultra-Short Term Relief)            Interpretative annotation: Clinically appropriate result. Analgesia during this period is likely to be Local Anesthetic and/or IV Sedative (Analgesic/Anxiolytic) related.          Effects of local anesthetic: The analgesic effects attained during this period are directly associated to the localized infiltration of local anesthetics and therefore cary significant diagnostic value  as to the etiological location, or anatomical origin, of the pain. Expected duration of relief is directly dependent on the pharmacodynamics of the local anesthetic used. Long-acting (4-6 hours)  anesthetics used.  Reported result: Relief during the next 4 to 6 hour after the procedure: 75 % (Short-Term Relief)            Interpretative annotation: Clinically appropriate result. Analgesia during this period is likely to be Local Anesthetic-related.          Long-term benefit: Defined as the period of time past the expected duration of local anesthetics (1 hour for short-acting and 4-6 hours for long-acting). With the possible exception of prolonged sympathetic blockade from the local anesthetics, benefits during this period are typically attributed to, or associated with, other factors such as analgesic sensory neuropraxia, antiinflammatory effects, or beneficial biochemical changes provided by agents other than the local anesthetics.  Reported result: Extended relief following procedure: 80 % (Long-Term Relief) [name] reports the axial pain improved more than the extremity pain. Interpretative annotation: Clinically appropriate result. Good relief. No permanent benefit expected. Inflammation plays a part in the etiology to the pain.          Current benefits: Defined as reported results that persistent at this point in time.   Analgesia: 25-50 % Terri Werner reports improvement of axial symptoms. Function: Somewhat improved ROM: Somewhat improved Interpretative annotation: Partial relief. No permanent benefit expected. Effective diagnostic intervention.          Interpretation: Results would suggest a successful diagnostic intervention. We'll proceed with diagnostic intervention #2, as soon as convenient          Plan:  Proceed with treatment No.2  Laboratory Chemistry  Inflammation Markers (CRP: Acute Phase) (ESR: Chronic Phase) No results found for: CRP, ESRSEDRATE               Renal Function Markers Lab Results  Component Value Date   BUN 15 07/05/2014   CREATININE 0.89 07/05/2014   GFRAA >60 07/03/2012   GFRNONAA >60 07/03/2012                  Hepatic Function Markers Lab Results  Component Value Date   AST 25 07/05/2014   ALT 24 07/05/2014   ALBUMIN 3.8 07/05/2014   ALKPHOS 52 07/05/2014   HCVAB NEGATIVE 07/06/2015                 Electrolytes Lab Results  Component Value Date   NA 142 07/05/2014   K 4.4 07/05/2014   CL 106 07/05/2014   CALCIUM 9.4 07/05/2014                 Neuropathy Markers No results found for: [id number]               Bone Pathology Markers Lab Results  Component Value Date   ALKPHOS 52 07/05/2014   VD25OH 28.98 (L) 04/15/2014   CALCIUM 9.4 07/05/2014                 Coagulation Parameters Lab Results  Component Value Date   PLT 315.0 07/05/2014                 Cardiovascular Markers Lab Results  Component Value Date   HGB 12.3 07/05/2014   HCT 36.9 07/05/2014                 Note: Lab results reviewed.  Recent Diagnostic Imaging Results  DG C-Arm 1-60 Min-No  Report Fluoroscopy was utilized by the requesting physician.  No radiographic  interpretation.   Complexity Note: Imaging results reviewed. Results shared with Ms. Sedam, using Layman's terms.                         Meds   Current Outpatient Prescriptions:  .  amLODipine (NORVASC) 10 MG tablet, take 1 tablet by mouth once daily, Disp: 30 tablet, Rfl: 4 .  aspirin 81 MG tablet, Take 81 mg by mouth daily., Disp: , Rfl:  .  Cholecalciferol (VITAMIN D) 2000 UNITS CAPS, Take 2,000 Units by mouth daily., Disp: , Rfl:  .  diclofenac sodium (VOLTAREN) 1 % GEL, Apply 2 g topically 4 (four) times daily. Uses a couple time a week, Disp: 100 g, Rfl: 2 .  fluticasone (FLONASE) 50 MCG/ACT nasal spray, Place 2 sprays into both nostrils daily. (Patient taking differently: Place 2 sprays into both nostrils as needed. ), Disp: 16 g, Rfl: 6 .  gabapentin (NEURONTIN) 100 MG capsule, Take 200 mg by mouth at bedtime., Disp: , Rfl:  .  HYDROcodone-acetaminophen (NORCO/VICODIN) 5-325 MG tablet, Take 1 tablet by mouth 3  (three) times daily as needed for moderate pain. (Patient taking differently: Take 1 tablet by mouth as needed for moderate pain. ), Disp: 30 tablet, Rfl: 0 .  ibuprofen (ADVIL,MOTRIN) 200 MG tablet, Take 400 mg by mouth daily., Disp: , Rfl:  .  lidocaine (LIDODERM) 5 %, Place 1 patch onto the skin daily. Remove & Discard patch within 12 hours., Disp: 30 patch, Rfl: 0 .  Magnesium 250 MG TABS, Take 250 mg by mouth daily., Disp: , Rfl:  .  meloxicam (MOBIC) 15 MG tablet, Take 1 tablet (15 mg total) by mouth daily as needed for pain., Disp: 30 tablet, Rfl: 3 .  naproxen sodium (ANAPROX) 220 MG tablet, Take 440 mg by mouth daily as needed., Disp: , Rfl:  .  nystatin cream (MYCOSTATIN), Apply 1 application topically 2 (two) times daily., Disp: , Rfl:  .  pravastatin (PRAVACHOL) 20 MG tablet, Take 1 tablet (20 mg total) by mouth at bedtime., Disp: 90 tablet, Rfl: 1 .  tizanidine (ZANAFLEX) 2 MG capsule, 2-4 mg BID prn spams, Disp: 60 capsule, Rfl: 2 .  traMADol (ULTRAM) 50 MG tablet, Take 1-2 tablets (50-100 mg total) by mouth every 8 (eight) hours as needed., Disp: 90 tablet, Rfl: 1  ROS  Constitutional: Denies any fever or chills Gastrointestinal: No reported hemesis, hematochezia, vomiting, or acute GI distress Musculoskeletal: Denies any acute onset joint swelling, redness, loss of ROM, or weakness Neurological: No reported episodes of acute onset apraxia, aphasia, dysarthria, agnosia, amnesia, paralysis, loss of coordination, or loss of consciousness  Allergies  Ms. Onion is allergic to latex; lisinopril; and shrimp [shellfish allergy].  PFSH  Drug: Ms. Ganus  reports that she does not use drugs. Alcohol:  reports that she drinks alcohol. Tobacco:  reports that she has never smoked. She has never used smokeless tobacco. Medical:  has a past medical history of Arthritis; Colon polyps (2010); Genital warts; GERD (gastroesophageal reflux disease); Heart murmur; Hepatitis B;  History of blood transfusion; History of chicken pox; History of hiatal hernia; History of torn meniscus of right knee; HTN (hypertension); migraine headaches; Hypertension; and Hypopotassemia. Surgical: Ms. Losier  has a past surgical history that includes Tonsillectomy; Vesicovaginal fistula closure w/ TAH; Cesarean section; laproscopic Left knee repair (1971); Ectopic pregnancy surgery; Total abdominal hysterectomy w/ bilateral salpingoophorectomy (2006); Knee  arthroscopy; Appendectomy (1980); Abdominal hysterectomy; Hernia repair; and Colonoscopy with propofol (N/A, 11/01/2014). Family: family history includes Alzheimer's disease in her mother; Birth defects in her maternal grandmother; Breast cancer in her sister; Cancer in her sister; Colon cancer in her maternal grandmother; Dementia in her mother; Hyperlipidemia in her maternal grandfather and unknown relative; Hypertension in her maternal grandfather and mother.  Constitutional Exam  General appearance: Well nourished, well developed, and well hydrated. In no apparent acute distress Vitals:   02/14/17 1101  BP: (!) 154/88  Pulse: 72  Resp: 18  Temp: 98.2 F (36.8 C)  TempSrc: Oral  SpO2: 100%  Weight: 230 lb (104.3 kg)  Height: 5\' 1"  (1.549 m)   BMI Assessment: Estimated body mass index is 43.46 kg/m as calculated from the following:   Height as of this encounter: 5\' 1"  (1.549 m).   Weight as of this encounter: 230 lb (104.3 kg).  BMI interpretation table: BMI level Category Range association with higher incidence of chronic pain  <18 kg/m2 Underweight   18.5-24.9 kg/m2 Ideal body weight   25-29.9 kg/m2 Overweight Increased incidence by 20%  30-34.9 kg/m2 Obese (Class I) Increased incidence by 68%  35-39.9 kg/m2 Severe obesity (Class II) Increased incidence by 136%  >40 kg/m2 Extreme obesity (Class III) Increased incidence by 254%   BMI Readings from Last 4 Encounters:  02/14/17 43.46 kg/m  02/06/17 42.07 kg/m   01/15/17 44.21 kg/m  12/13/16 45.35 kg/m   Wt Readings from Last 4 Encounters:  02/14/17 230 lb (104.3 kg)  02/06/17 230 lb (104.3 kg)  01/15/17 234 lb (106.1 kg)  12/13/16 240 lb (108.9 kg)  Psych/Mental status: Alert, oriented x 3 (person, place, & time)       Eyes: PERLA Respiratory: No evidence of acute respiratory distress  Cervical Spine Area Exam  Skin & Axial Inspection: No masses, redness, edema, swelling, or associated skin lesions Alignment: Symmetrical Functional ROM: Unrestricted ROM      Stability: No instability detected Muscle Tone/Strength: Functionally intact. No obvious neuro-muscular anomalies detected. Sensory (Neurological): Unimpaired Palpation: No palpable anomalies              Upper Extremity (UE) Exam    Side: Right upper extremity  Side: Left upper extremity  Skin & Extremity Inspection: Skin color, temperature, and hair growth are WNL. No peripheral edema or cyanosis. No masses, redness, swelling, asymmetry, or associated skin lesions. No contractures.  Skin & Extremity Inspection: Skin color, temperature, and hair growth are WNL. No peripheral edema or cyanosis. No masses, redness, swelling, asymmetry, or associated skin lesions. No contractures.  Functional ROM: Unrestricted ROM          Functional ROM: Unrestricted ROM          Muscle Tone/Strength: Functionally intact. No obvious neuro-muscular anomalies detected.  Muscle Tone/Strength: Functionally intact. No obvious neuro-muscular anomalies detected.  Sensory (Neurological): Unimpaired          Sensory (Neurological): Unimpaired          Palpation: No palpable anomalies              Palpation: No palpable anomalies              Specialized Test(s): Deferred         Specialized Test(s): Deferred          Thoracic Spine Area Exam  Skin & Axial Inspection: No masses, redness, or swelling Alignment: Symmetrical Functional ROM: Unrestricted ROM Stability: No instability detected Muscle  Tone/Strength: Functionally  intact. No obvious neuro-muscular anomalies detected. Sensory (Neurological): Unimpaired Muscle strength & Tone: No palpable anomalies  Lumbar Spine Area Exam  Skin & Axial Inspection: No masses, redness, or swelling Alignment: Symmetrical Functional ROM: Unrestricted ROM      Stability: No instability detected Muscle Tone/Strength: Functionally intact. No obvious neuro-muscular anomalies detected. Sensory (Neurological): Unimpaired Palpation: No palpable anomalies       Provocative Tests: Lumbar Hyperextension and rotation test: Improved after treatment  bilaterally Lumbar Lateral bending test: Improved after treatment bilaterally       Patrick's Maneuver: evaluation deferred today                    Gait & Posture Assessment  Ambulation: Unassisted Gait: Relatively normal for age and body habitus Posture: WNL   Lower Extremity Exam    Side: Right lower extremity  Side: Left lower extremity  Skin & Extremity Inspection: Skin color, temperature, and hair growth are WNL. No peripheral edema or cyanosis. No masses, redness, swelling, asymmetry, or associated skin lesions. No contractures.  Skin & Extremity Inspection: Skin color, temperature, and hair growth are WNL. No peripheral edema or cyanosis. No masses, redness, swelling, asymmetry, or associated skin lesions. No contractures.  Functional ROM: Unrestricted ROM          Functional ROM: Unrestricted ROM          Muscle Tone/Strength: Functionally intact. No obvious neuro-muscular anomalies detected.  Muscle Tone/Strength: Functionally intact. No obvious neuro-muscular anomalies detected.  Sensory (Neurological): Unimpaired  Sensory (Neurological): Unimpaired  Palpation: No palpable anomalies  Palpation: No palpable anomalies   Assessment and Plan  Primary Diagnosis & Pertinent Problem List: The primary encounter diagnosis was Lumbar spondylolysis. Diagnoses of Lumbar degenerative disc disease, Lumbar  facet arthropathy, Chronic low back pain, unspecified back pain laterality, with sciatica presence unspecified, Chronic pain syndrome, Degeneration of lumbar or lumbosacral intervertebral disc, and Chronic right-sided low back pain without sciatica were also pertinent to this visit.  Status Diagnosis  Responding Responding Responding 1. Lumbar spondylolysis   2. Lumbar degenerative disc disease   3. Lumbar facet arthropathy   4. Chronic low back pain, unspecified back pain laterality, with sciatica presence unspecified   5. Chronic pain syndrome   6. Degeneration of lumbar or lumbosacral intervertebral disc   7. Chronic right-sided low back pain without sciatica      66 year old female with a history of low back pain secondary to lumbar spondylosis and lumbar facet arthropathy status post bilateral lumbar facet blocks who presents for post procedural follow-up. Patient notes significant pain relief (greater than 60%) of her low back pain, improved functionality, ability to perform activities of daily living more comfortably. She did not find physical therapy effective. Results from the diagnostic block suggests successful diagnostic intervention and we will proceed with diagnostic block #2 followed by radiofrequency ablation thereafter.  -Diagnostic lumbar facet block #2 followed by RFA -continue gabapentin, Voltaren gel, lidocaine patches, tizanidine as previously prescribed    Orders Placed This Encounter  Procedures  . LUMBAR FACET(MEDIAL BRANCH NERVE BLOCK) [facility]    Provider-requested follow-up: Return for Procedure.  Future Appointments Date Time Provider Department Center  03/19/2017 9:30 AM Edward Jolly, MD [facility] None  02/07/2018 1:00 PM [facility] None  02/07/2018 2:00 PM Dew, Marlow Baars, MD [facility] None    Primary Care Physician: Leotis Shames, MD Location: [facility] Note by: Edward Jolly, M.D Date: 02/14/2017; Time: 1:07  PM  Patient Instructions  Schedule you for bilateral lumbar facet diagnostic block #2. Facet Blocks Patient Information  Description: The facets are joints in the spine between the vertebrae.  Like any joints in the body, facets can become irritated and painful.  Arthritis can also effect the facets.  By injecting steroids and local anesthetic in and around these joints, we can temporarily block the nerve supply to them.  Steroids act directly on irritated nerves and tissues to reduce selling and inflammation which often leads to decreased pain.  Facet blocks may be done anywhere along the spine from the neck to the low back depending upon the location of your pain.   After numbing the skin with local anesthetic (like Novocaine), a small needle is passed onto the facet joints under x-ray guidance.  You may experience a sensation of pressure while this is being done.  The entire block usually lasts about 15-25 minutes.   Conditions which may be treated by facet blocks:   Low back/buttock pain  Neck/shoulder pain  Certain types of headaches  Preparation for the injection:  1. Do not eat any solid food or dairy products within 8 hours of your appointment. 2. You may drink clear liquid up to 3 hours before appointment.  Clear liquids include water, black coffee, juice or soda.  No milk or cream please. 3. You may take your regular medication, including pain medications, with a sip of water before your appointment.  Diabetics should hold regular insulin (if taken separately) and take 1/2 normal NPH dose the morning of the procedure.  Carry some sugar containing items with you to your appointment. 4. A driver must accompany you and be prepared to drive you home after your procedure. 5. Bring all your current medications with you. 6. An IV may be inserted and sedation may be given at the discretion of the physician. 7. A blood pressure cuff, EKG and other monitors will often be applied during the  procedure.  Some patients may need to have extra oxygen administered for a short period. 8. You will be asked to provide medical information, including your allergies and medications, prior to the procedure.  We must know immediately if you are taking blood thinners (like Coumadin/Warfarin) or if you are allergic to IV iodine contrast (dye).  We must know if you could possible be pregnant.  Possible side-effects:   Bleeding from needle site  Infection (rare, may require surgery)  Nerve injury (rare)  Numbness & tingling (temporary)  Difficulty urinating (rare, temporary)  Spinal headache (a headache worse with upright posture)  Light-headedness (temporary)  Pain at injection site (serveral days)  Decreased blood pressure (rare, temporary)  Weakness in arm/leg (temporary)  Pressure sensation in back/neck (temporary)   Call if you experience:   Fever/chills associated with headache or increased back/neck pain  Headache worsened by an upright position  New onset, weakness or numbness of an extremity below the injection site  Hives or difficulty breathing (go to the emergency room)  Inflammation or drainage at the injection site(s)  Severe back/neck pain greater than usual  New symptoms which are concerning to you  Please note:  Although the local anesthetic injected can often make your back or neck feel good for several hours after the injection, the pain will likely return. It takes 3-7 days for steroids to work.  You may not notice any pain relief for at least one week.  If effective, we will often do a series of 2-3 injections spaced 3-6 weeks apart  to maximally decrease your pain.  After the initial series, you may be a candidate for a more permanent nerve block of the facets.  If you have any questions, please call #[phone] [facility]

## 2017-02-14 NOTE — Progress Notes (Signed)
Safety precautions to be maintained throughout the outpatient stay will include: orient to surroundings, keep bed in low position, maintain call bell within reach at all times, provide assistance with transfer out of bed and ambulation.  

## 2017-02-14 NOTE — Patient Instructions (Addendum)
Schedule you for bilateral lumbar facet diagnostic block #2. Facet Blocks Patient Information  Description: The facets are joints in the spine between the vertebrae.  Like any joints in the body, facets can become irritated and painful.  Arthritis can also effect the facets.  By injecting steroids and local anesthetic in and around these joints, we can temporarily block the nerve supply to them.  Steroids act directly on irritated nerves and tissues to reduce selling and inflammation which often leads to decreased pain.  Facet blocks may be done anywhere along the spine from the neck to the low back depending upon the location of your pain.   After numbing the skin with local anesthetic (like Novocaine), a small needle is passed onto the facet joints under x-ray guidance.  You may experience a sensation of pressure while this is being done.  The entire block usually lasts about 15-25 minutes.   Conditions which may be treated by facet blocks:   Low back/buttock pain  Neck/shoulder pain  Certain types of headaches  Preparation for the injection:  1. Do not eat any solid food or dairy products within 8 hours of your appointment. 2. You may drink clear liquid up to 3 hours before appointment.  Clear liquids include water, black coffee, juice or soda.  No milk or cream please. 3. You may take your regular medication, including pain medications, with a sip of water before your appointment.  Diabetics should hold regular insulin (if taken separately) and take 1/2 normal NPH dose the morning of the procedure.  Carry some sugar containing items with you to your appointment. 4. A driver must accompany you and be prepared to drive you home after your procedure. 5. Bring all your current medications with you. 6. An IV may be inserted and sedation may be given at the discretion of the physician. 7. A blood pressure cuff, EKG and other monitors will often be applied during the procedure.  Some patients  may need to have extra oxygen administered for a short period. 8. You will be asked to provide medical information, including your allergies and medications, prior to the procedure.  We must know immediately if you are taking blood thinners (like Coumadin/Warfarin) or if you are allergic to IV iodine contrast (dye).  We must know if you could possible be pregnant.  Possible side-effects:   Bleeding from needle site  Infection (rare, may require surgery)  Nerve injury (rare)  Numbness & tingling (temporary)  Difficulty urinating (rare, temporary)  Spinal headache (a headache worse with upright posture)  Light-headedness (temporary)  Pain at injection site (serveral days)  Decreased blood pressure (rare, temporary)  Weakness in arm/leg (temporary)  Pressure sensation in back/neck (temporary)   Call if you experience:   Fever/chills associated with headache or increased back/neck pain  Headache worsened by an upright position  New onset, weakness or numbness of an extremity below the injection site  Hives or difficulty breathing (go to the emergency room)  Inflammation or drainage at the injection site(s)  Severe back/neck pain greater than usual  New symptoms which are concerning to you  Please note:  Although the local anesthetic injected can often make your back or neck feel good for several hours after the injection, the pain will likely return. It takes 3-7 days for steroids to work.  You may not notice any pain relief for at least one week.  If effective, we will often do a series of 2-3 injections spaced 3-6 weeks apart  to maximally decrease your pain.  After the initial series, you may be a candidate for a more permanent nerve block of the facets.  If you have any questions, please call #[phone] [facility]

## 2017-02-20 ENCOUNTER — Ambulatory Visit
Admission: RE | Admit: 2017-02-20 | Discharge: 2017-02-20 | Disposition: A | Discharge status: Home / Self Care | Payer: Managed Care, Other (non HMO) | Source: Ambulatory Visit | Attending: Student in an Organized Health Care Education/Training Program | Admitting: Student in an Organized Health Care Education/Training Program

## 2017-02-20 ENCOUNTER — Encounter: Payer: Self-pay | Admitting: Student in an Organized Health Care Education/Training Program

## 2017-02-20 ENCOUNTER — Ambulatory Visit (HOSPITAL_BASED_OUTPATIENT_CLINIC_OR_DEPARTMENT_OTHER): Payer: Managed Care, Other (non HMO) | Admitting: Student in an Organized Health Care Education/Training Program

## 2017-02-20 VITALS — BP 148/80 | HR 71 | Temp 98.3°F | Resp 16 | Ht 62.0 in | Wt 230.0 lb

## 2017-02-20 DIAGNOSIS — M1288 Other specific arthropathies, not elsewhere classified, other specified site: Secondary | ICD-10-CM | POA: Diagnosis not present

## 2017-02-20 DIAGNOSIS — M47816 Spondylosis without myelopathy or radiculopathy, lumbar region: Secondary | ICD-10-CM

## 2017-02-20 DIAGNOSIS — M4306 Spondylolysis, lumbar region: Secondary | ICD-10-CM

## 2017-02-20 DIAGNOSIS — M5136 Other intervertebral disc degeneration, lumbar region: Secondary | ICD-10-CM | POA: Insufficient documentation

## 2017-02-20 DIAGNOSIS — G894 Chronic pain syndrome: Secondary | ICD-10-CM | POA: Diagnosis not present

## 2017-02-20 DIAGNOSIS — M5440 Lumbago with sciatica, unspecified side: Secondary | ICD-10-CM | POA: Insufficient documentation

## 2017-02-20 DIAGNOSIS — M545 Low back pain: Secondary | ICD-10-CM | POA: Diagnosis present

## 2017-02-20 DIAGNOSIS — G8929 Other chronic pain: Secondary | ICD-10-CM

## 2017-02-20 MED ORDER — LACTATED RINGERS IV SOLN
1000.0000 mL | Freq: Once | INTRAVENOUS | Status: AC
  Administered 2017-02-20: 1000 mL via INTRAVENOUS

## 2017-02-20 MED ORDER — FENTANYL CITRATE (PF) 100 MCG/2ML IJ SOLN
25.0000 ug | INTRAMUSCULAR | Status: DC | PRN
  Administered 2017-02-20: 75 ug via INTRAVENOUS

## 2017-02-20 MED ORDER — ROPIVACAINE HCL 2 MG/ML IJ SOLN
10.0000 mL | Freq: Once | INTRAMUSCULAR | Status: AC
  Administered 2017-02-20: 10 mL

## 2017-02-20 MED ORDER — LIDOCAINE HCL (PF) 1 % IJ SOLN
10.0000 mL | Freq: Once | INTRAMUSCULAR | Status: AC
  Administered 2017-02-20: 5 mL

## 2017-02-20 MED ORDER — LIDOCAINE HCL (PF) 1 % IJ SOLN
INTRAMUSCULAR | Status: AC
  Filled 2017-02-20: qty 5

## 2017-02-20 MED ORDER — FENTANYL CITRATE (PF) 100 MCG/2ML IJ SOLN
INTRAMUSCULAR | Status: AC
  Filled 2017-02-20: qty 2

## 2017-02-20 MED ORDER — ROPIVACAINE HCL 2 MG/ML IJ SOLN
INTRAMUSCULAR | Status: AC
  Filled 2017-02-20: qty 10

## 2017-02-20 NOTE — Progress Notes (Signed)
Safety precautions to be maintained throughout the outpatient stay will include: orient to surroundings, keep bed in low position, maintain call bell within reach at all times, provide assistance with transfer out of bed and ambulation.  

## 2017-02-20 NOTE — Patient Instructions (Signed)

## 2017-02-20 NOTE — Progress Notes (Signed)
Patient's Name: Terri Werner  MRN: [id number]  Referring Provider: Edward Jolly, MD  DOB: 10/13/1950  PCP: Leotis Shames, MD  DOS: 02/20/2017  Note by: Edward Jolly, MD  Service setting: Ambulatory outpatient  Specialty: Interventional Pain Management  Patient type: Established  Location: [facility] (AMB) Pain Management Facility  Visit type: Interventional Procedure   Primary Reason for Visit: Interventional Pain Management Treatment. CC: Back Pain (low) and Hip Pain (bilateral)  Procedure:  Anesthesia, Analgesia, Anxiolysis:  Type: Diagnostic Medial Branch Facet Block #2 Region: Lumbar Level:  L3, L4, L5, Medial Branch Level(s) Laterality: Bilateral  Type: Local Anesthesia with Moderate (Conscious) Sedation Local Anesthetic: Lidocaine 1% Route: Intravenous (IV) IV Access: Secured Sedation: Meaningful verbal contact was maintained at all times during the procedure  Indication(s): Analgesia and Anxiety   Indications: 1. Lumbar spondylolysis   2. Lumbar degenerative disc disease   3. Lumbar facet arthropathy   4. Chronic low back pain, unspecified back pain laterality, with sciatica presence unspecified   5. Chronic pain syndrome    Pain Score: Pre-procedure: 6 /10 Post-procedure: 0-No pain/10  Pre-op Assessment:  Ms. Wurzel is a 66 y.o. (year old), female patient, seen today for interventional treatment. She  has a past surgical history that includes Tonsillectomy; Vesicovaginal fistula closure w/ TAH; Cesarean section; laproscopic Left knee repair (1971); Ectopic pregnancy surgery; Total abdominal hysterectomy w/ bilateral salpingoophorectomy (2006); Knee arthroscopy; Appendectomy (1980); Abdominal hysterectomy; Hernia repair; and Colonoscopy with propofol (N/A, 11/01/2014). Ms. Tivnan has a current medication list which includes the following prescription(s): amlodipine, aspirin, vitamin d, diclofenac sodium, fluticasone, gabapentin,  hydrocodone-acetaminophen, ibuprofen, lidocaine, magnesium, meloxicam, naproxen sodium, nystatin cream, pravastatin, tizanidine, and tramadol, and the following Facility-Administered Medications: fentanyl. Her primarily concern today is the Back Pain (low) and Hip Pain (bilateral)  Initial Vital Signs: Blood pressure (!) 148/80, pulse 71, temperature 98.3 F (36.8 C), temperature source Oral, resp. rate 16, height 5\' 2"  (1.575 m), weight 230 lb (104.3 kg), SpO2 97 %. BMI: Estimated body mass index is 42.07 kg/m as calculated from the following:   Height as of this encounter: 5\' 2"  (1.575 m).   Weight as of this encounter: 230 lb (104.3 kg).  Risk Assessment: Allergies: Reviewed. She is allergic to latex; lisinopril; and shrimp [shellfish allergy].  Allergy Precautions: None required Coagulopathies: Reviewed. None identified.  Blood-thinner therapy: None at this time Active Infection(s): Reviewed. None identified. Ms. Sobieraj is afebrile  Site Confirmation: Ms. Carrete was asked to confirm the procedure and laterality before marking the site Procedure checklist: Completed Consent: Before the procedure and under the influence of no sedative(s), amnesic(s), or anxiolytics, the patient was informed of the treatment options, risks and possible complications. To fulfill our ethical and legal obligations, as recommended by the American Medical Association's Code of Ethics, I have informed the patient of my clinical impression; the nature and purpose of the treatment or procedure; the risks, benefits, and possible complications of the intervention; the alternatives, including doing nothing; the risk(s) and benefit(s) of the alternative treatment(s) or procedure(s); and the risk(s) and benefit(s) of doing nothing. The patient was provided information about the general risks and possible complications associated with the procedure. These may include, but are not limited to: failure to  achieve desired goals, infection, bleeding, organ or nerve damage, allergic reactions, paralysis, and death. In addition, the patient was informed of those risks and complications associated to Spine-related procedures, such as failure to decrease pain; infection (i.e.: Meningitis, epidural or intraspinal abscess); bleeding (i.e.: epidural hematoma,  subarachnoid hemorrhage, or any other type of intraspinal or peri-dural bleeding); organ or nerve damage (i.e.: Any type of peripheral nerve, nerve root, or spinal cord injury) with subsequent damage to sensory, motor, and/or autonomic systems, resulting in permanent pain, numbness, and/or weakness of one or several areas of the body; allergic reactions; (i.e.: anaphylactic reaction); and/or death. Furthermore, the patient was informed of those risks and complications associated with the medications. These include, but are not limited to: allergic reactions (i.e.: anaphylactic or anaphylactoid reaction(s)); adrenal axis suppression; blood sugar elevation that in diabetics may result in ketoacidosis or comma; water retention that in patients with history of congestive heart failure may result in shortness of breath, pulmonary edema, and decompensation with resultant heart failure; weight gain; swelling or edema; medication-induced neural toxicity; particulate matter embolism and blood vessel occlusion with resultant organ, and/or nervous system infarction; and/or aseptic necrosis of one or more joints. Finally, the patient was informed that Medicine is not an exact science; therefore, there is also the possibility of unforeseen or unpredictable risks and/or possible complications that may result in a catastrophic outcome. The patient indicated having understood very clearly. We have given the patient no guarantees and we have made no promises. Enough time was given to the patient to ask questions, all of which were answered to the patient's satisfaction. Ms.  Frate has indicated that she wanted to continue with the procedure. Attestation: I, the ordering provider, attest that I have discussed with the patient the benefits, risks, side-effects, alternatives, likelihood of achieving goals, and potential problems during recovery for the procedure that I have provided informed consent. Date: 02/20/2017; Time: 11:53 AM  Pre-Procedure Preparation:  Monitoring: As per clinic protocol. Respiration, ETCO2, SpO2, BP, heart rate and rhythm monitor placed and checked for adequate function Safety Precautions: Patient was assessed for positional comfort and pressure points before starting the procedure. Time-out: I initiated and conducted the "Time-out" before starting the procedure, as per protocol. The patient was asked to participate by confirming the accuracy of the "Time Out" information. Verification of the correct person, site, and procedure were performed and confirmed by me, the nursing staff, and the patient. "Time-out" conducted as per Joint Commission's Universal Protocol (UP.01.01.01). "Time-out" Date & Time: 02/20/2017; 1045 hrs.  Description of Procedure Process:   Position: Prone Target Area: For Lumbar Facet blocks, the target is the groove formed by the junction of the transverse process and superior articular process. For the L5 dorsal ramus, the target is the notch between superior articular process and sacral ala. Approach: Paramedial approach. Area Prepped: Entire Posterior Lumbosacral Region Prepping solution: ChloraPrep (2% chlorhexidine gluconate and 70% isopropyl alcohol) Safety Precautions: Aspiration looking for blood return was conducted prior to all injections. At no point did we inject any substances, as a needle was being advanced. No attempts were made at seeking any paresthesias. Safe injection practices and needle disposal techniques used. Medications properly checked for expiration dates. SDV (single dose vial) medications  used. Description of the Procedure: Protocol guidelines were followed. The patient was placed in position over the fluoroscopy table. The target area was identified and the area prepped in the usual manner. Skin desensitized using vapocoolant spray. Skin & deeper tissues infiltrated with local anesthetic. Appropriate amount of time allowed to pass for local anesthetics to take effect. The procedure needle was introduced through the skin, ipsilateral to the reported pain, and advanced to the target area. Employing the "Medial Branch Technique", the needles were advanced to the angle made by  the superior and medial portion of the transverse process, and the lateral and inferior portion of the superior articulating process of the targeted vertebral bodies. This area is known as "Burton's Eye" or the "Eye of the [place] Dog". A procedure needle was introduced through the skin, and this time advanced to the angle made by the superior and medial border of the sacral ala, and the lateral border of the S1 vertebral body. Negative aspiration confirmed. Solution injected in intermittent fashion, asking for systemic symptoms every 0.5cc of injectate. The needles were then removed and the area cleansed, making sure to leave some of the prepping solution back to take advantage of its long term bactericidal properties.   Illustration of the posterior view of the lumbar spine and the posterior neural structures. Laminae of L2 through S1 are labeled. DPRL5, dorsal primary ramus of L5; DPRS1, dorsal primary ramus of S1; DPR3, dorsal primary ramus of L3; FJ, facet (zygapophyseal) joint L3-L4; I, inferior articular process of L4; LB1, lateral branch of dorsal primary ramus of L1; IAB, inferior articular branches from L3 medial branch (supplies L4-L5 facet joint); IBP, intermediate branch plexus; MB3, medial branch of dorsal primary ramus of L3; NR3, third lumbar nerve root; S, superior articular process of L5; SAB, superior  articular branches from L4 (supplies L4-5 facet joint also); TP3, transverse process of L3.  Vitals:   02/20/17 1102 02/20/17 1105 02/20/17 1115 02/20/17 1125  BP: (!) 165/94 (!) 147/84 (!) 164/91 (!) 148/80  Pulse:      Resp: (!) 21 20 18 16   Temp:      TempSrc:      SpO2: 100% 100% 96% 97%  Weight:      Height:        Start Time: 1046 hrs. End Time: 1104 hrs. Materials:  Needle(s) Type: Regular needle Gauge: 22G Length: 3.5-in Medication(s): We administered lactated ringers, fentaNYL, lidocaine (PF), and ropivacaine (PF) 2 mg/mL (0.2%). Please see chart orders for dosing details. 1.5 cc of 0.2% ropivacaine at each level. Imaging Guidance (Spinal):  Type of Imaging Technique: Fluoroscopy Guidance (Spinal) Indication(s): Assistance in needle guidance and placement for procedures requiring needle placement in or near specific anatomical locations not easily accessible without such assistance. Exposure Time: Please see nurses notes. Contrast: None used. Fluoroscopic Guidance: I was personally present during the use of fluoroscopy. "Tunnel Vision Technique" used to obtain the best possible view of the target area. Parallax error corrected before commencing the procedure. "Direction-depth-direction" technique used to introduce the needle under continuous pulsed fluoroscopy. Once target was reached, antero-posterior, oblique, and lateral fluoroscopic projection used confirm needle placement in all planes. Images permanently stored in EMR. Interpretation: No contrast injected. I personally interpreted the imaging intraoperatively. Adequate needle placement confirmed in multiple planes. Permanent images saved into the patient's record.  Antibiotic Prophylaxis:  Indication(s): None identified Antibiotic given: None  Post-operative Assessment:  EBL: None Complications: No immediate post-treatment complications observed by team, or reported by patient. Note: The patient tolerated the entire  procedure well. A repeat set of vitals were taken after the procedure and the patient was kept under observation following institutional policy, for this type of procedure. Post-procedural neurological assessment was performed, showing return to baseline, prior to discharge. The patient was provided with post-procedure discharge instructions, including a section on how to identify potential problems. Should any problems arise concerning this procedure, the patient was given instructions to immediately contact us, at any time, without hesitation. In any case, we plan to contact the  patient by telephone for a follow-up status report regarding this interventional procedure. Comments:  No additional relevant information. 5 out of 5 strength bilateral lower extremity: Plantar flexion, dorsiflexion, knee flexion, knee extension. Plan of Care   Imaging Orders     DG C-Arm 1-60 Min-No Report Procedure Orders    No procedure(s) ordered today    Medications ordered for procedure: Meds ordered this encounter  Medications  . lactated ringers infusion 1,000 mL  . fentaNYL (SUBLIMAZE) injection 25-50 mcg    Make sure Narcan is available in the pyxis when using this medication. In the event of respiratory depression (RR< 8/min): Titrate NARCAN (naloxone) in increments of 0.1 to 0.2 mg IV at 2-3 minute intervals, until desired degree of reversal.  . lidocaine (PF) (XYLOCAINE) 1 % injection 10 mL  . ropivacaine (PF) 2 mg/mL (0.2%) (NAROPIN) injection 10 mL   Medications administered: We administered lactated ringers, fentaNYL, lidocaine (PF), and ropivacaine (PF) 2 mg/mL (0.2%).  See the medical record for exact dosing, route, and time of administration.  New Prescriptions   No medications on file   Disposition: Discharge home  Discharge Date & Time: 02/20/2017; 1125 hrs.   Physician-requested Follow-up: Return in about 2 weeks (around 03/06/2017) for Post Procedure Evaluation. Future  Appointments Date Time Provider Department Center  03/19/2017 9:30 AM Edward Jolly, MD [facility] None  02/07/2018 1:00 PM [facility] 3 [facility] None  02/07/2018 2:00 PM Dew, Marlow Baars, MD [facility] None   Primary Care Physician: Leotis Shames, MD Location: [facility] Note by: Edward Jolly, MD Date: 02/20/2017; Time: 11:55 AM  Disclaimer:  Medicine is not an exact science. The only guarantee in medicine is that nothing is guaranteed. It is important to note that the decision to proceed with this intervention was based on the information collected from the patient. The Data and conclusions were drawn from the patient's questionnaire, the interview, and the physical examination. Because the information was provided in large part by the patient, it cannot be guaranteed that it has not been purposely or unconsciously manipulated. Every effort has been made to obtain as much relevant data as possible for this evaluation. It is important to note that the conclusions that lead to this procedure are derived in large part from the available data. Always take into account that the treatment will also be dependent on availability of resources and existing treatment guidelines, considered by other Pain Management Practitioners as being common knowledge and practice, at the time of the intervention. For Medico-Legal purposes, it is also important to point out that variation in procedural techniques and pharmacological choices are the acceptable norm. The indications, contraindications, technique, and results of the above procedure should only be interpreted and judged by a Board-Certified Interventional Pain Specialist with extensive familiarity and expertise in the same exact procedure and technique.

## 2017-02-21 ENCOUNTER — Telehealth: Payer: Self-pay | Admitting: *Deleted

## 2017-02-21 NOTE — Telephone Encounter (Signed)
Denies complications post procedure. 

## 2017-03-06 ENCOUNTER — Ambulatory Visit: Payer: Managed Care, Other (non HMO) | Admitting: Student in an Organized Health Care Education/Training Program

## 2017-03-19 ENCOUNTER — Encounter: Payer: Self-pay | Admitting: Student in an Organized Health Care Education/Training Program

## 2017-03-19 ENCOUNTER — Other Ambulatory Visit: Payer: Self-pay

## 2017-03-19 ENCOUNTER — Ambulatory Visit
Payer: Managed Care, Other (non HMO) | Attending: Student in an Organized Health Care Education/Training Program | Admitting: Student in an Organized Health Care Education/Training Program

## 2017-03-19 VITALS — BP 148/85 | HR 75 | Temp 98.6°F | Resp 18 | Ht 62.0 in | Wt 223.0 lb

## 2017-03-19 DIAGNOSIS — E785 Hyperlipidemia, unspecified: Secondary | ICD-10-CM | POA: Diagnosis not present

## 2017-03-19 DIAGNOSIS — M545 Low back pain: Secondary | ICD-10-CM

## 2017-03-19 DIAGNOSIS — Z79899 Other long term (current) drug therapy: Secondary | ICD-10-CM | POA: Diagnosis not present

## 2017-03-19 DIAGNOSIS — M5136 Other intervertebral disc degeneration, lumbar region: Secondary | ICD-10-CM | POA: Diagnosis not present

## 2017-03-19 DIAGNOSIS — I10 Essential (primary) hypertension: Secondary | ICD-10-CM | POA: Insufficient documentation

## 2017-03-19 DIAGNOSIS — K219 Gastro-esophageal reflux disease without esophagitis: Secondary | ICD-10-CM | POA: Diagnosis not present

## 2017-03-19 DIAGNOSIS — K5732 Diverticulitis of large intestine without perforation or abscess without bleeding: Secondary | ICD-10-CM | POA: Insufficient documentation

## 2017-03-19 DIAGNOSIS — Z7982 Long term (current) use of aspirin: Secondary | ICD-10-CM | POA: Diagnosis not present

## 2017-03-19 DIAGNOSIS — Z86010 Personal history of colonic polyps: Secondary | ICD-10-CM | POA: Insufficient documentation

## 2017-03-19 DIAGNOSIS — E876 Hypokalemia: Secondary | ICD-10-CM | POA: Insufficient documentation

## 2017-03-19 DIAGNOSIS — M5137 Other intervertebral disc degeneration, lumbosacral region: Secondary | ICD-10-CM | POA: Insufficient documentation

## 2017-03-19 DIAGNOSIS — M47816 Spondylosis without myelopathy or radiculopathy, lumbar region: Secondary | ICD-10-CM | POA: Diagnosis not present

## 2017-03-19 DIAGNOSIS — K449 Diaphragmatic hernia without obstruction or gangrene: Secondary | ICD-10-CM | POA: Diagnosis not present

## 2017-03-19 DIAGNOSIS — G8929 Other chronic pain: Secondary | ICD-10-CM

## 2017-03-19 DIAGNOSIS — M4306 Spondylolysis, lumbar region: Secondary | ICD-10-CM | POA: Diagnosis not present

## 2017-03-19 DIAGNOSIS — G894 Chronic pain syndrome: Secondary | ICD-10-CM | POA: Diagnosis not present

## 2017-03-19 NOTE — Patient Instructions (Signed)
Preparing for Procedure with Sedation Instructions: . Oral Intake: Do not eat or drink anything for at least 8 hours prior to your procedure. . Transportation: Public transportation is not allowed. Bring an adult driver. The driver must be physically present in our waiting room before any procedure can be started. Marland Kitchen Physical Assistance: Bring an adult capable of physically assisting you, in the event you need help. . Blood Pressure Medicine: Take your blood pressure medicine with a sip of water the morning of the procedure. . Insulin: Take only  of your normal insulin dose. . Preventing infections: Shower with an antibacterial soap the morning of your procedure. . Build-up your immune system: Take 1000 mg of Vitamin C with every meal (3 times a day) the day prior to your procedure. . Pregnancy: If you are pregnant, call and cancel the procedure. . Sickness: If you have a cold, fever, or any active infections, call and cancel the procedure. . Arrival: You must be in the facility at least 30 minutes prior to your scheduled procedure. . Children: Do not bring children with you. . Dress appropriately: Bring dark clothing that you would not mind if they get stained. . Valuables: Do not bring any jewelry or valuables. Procedure appointments are reserved for interventional treatments only. Marland Kitchen No Prescription Refills. . No medication changes will be discussed during procedure appointments. No disability issues will be discussed.      Radiofrequency Lesioning Radiofrequency lesioning is a procedure that is performed to relieve pain. The procedure is often used for back, neck, or arm pain. Radiofrequency lesioning involves the use of a machine that creates radio waves to make heat. During the procedure, the heat is applied to the nerve that carries the pain signal. The heat damages the nerve and interferes with the pain signal. Pain relief usually starts about 2 weeks after the procedure and lasts for  6 months to 1 year. Tell a health care provider about:  Any allergies you have.  All medicines you are taking, including vitamins, herbs, eye drops, creams, and over-the-counter medicines.  Any problems you or family members have had with anesthetic medicines.  Any blood disorders you have.  Any surgeries you have had.  Any medical conditions you have.  Whether you are pregnant or may be pregnant. What are the risks? Generally, this is a safe procedure. However, problems may occur, including:  Pain or soreness at the injection site.  Infection at the injection site.  Damage to nerves or blood vessels.  What happens before the procedure?  Ask your health care provider about: ? Changing or stopping your regular medicines. This is especially important if you are taking diabetes medicines or blood thinners. ? Taking medicines such as aspirin and ibuprofen. These medicines can thin your blood. Do not take these medicines before your procedure if your health care provider instructs you not to.  Follow instructions from your health care provider about eating or drinking restrictions.  Plan to have someone take you home after the procedure.  If you go home right after the procedure, plan to have someone with you for 24 hours. What happens during the procedure?  You will be given one or more of the following: ? A medicine to help you relax (sedative). ? A medicine to numb the area (local anesthetic).  You will be awake during the procedure. You will need to be able to talk with the health care provider during the procedure.  With the help of a type of  X-ray (fluoroscopy), the health care provider will insert a radiofrequency needle into the area to be treated.  Next, a wire that carries the radio waves (electrode) will be put through the radiofrequency needle. An electrical pulse will be sent through the electrode to verify the correct nerve. You will feel a tingling sensation,  and you may have muscle twitching.  Then, the tissue that is around the needle tip will be heated by an electric current that is passed using the radiofrequency machine. This will numb the nerves.  A bandage (dressing) will be put on the insertion area after the procedure is done. The procedure may vary among health care providers and hospitals. What happens after the procedure?  Your blood pressure, heart rate, breathing rate, and blood oxygen level will be monitored often until the medicines you were given have worn off.  Return to your normal activities as directed by your health care provider. This information is not intended to replace advice given to you by your health care provider. Make sure you discuss any questions you have with your health care provider. Document Released: 12/27/2010 Document Revised: 10/06/2015 Document Reviewed: 06/07/2014 Elsevier Interactive Patient Education  2018 [place]. Radiofrequency Lesioning Radiofrequency lesioning is a procedure that is performed to relieve pain. The procedure is often used for back, neck, or arm pain. Radiofrequency lesioning involves the use of a machine that creates radio waves to make heat. During the procedure, the heat is applied to the nerve that carries the pain signal. The heat damages the nerve and interferes with the pain signal. Pain relief usually starts about 2 weeks after the procedure and lasts for 6 months to 1 year. Tell a health care provider about: Any allergies you have. All medicines you are taking, including vitamins, herbs, eye drops, creams, and over-the-counter medicines. Any problems you or family members have had with anesthetic medicines. Any blood disorders you have. Any surgeries you have had. Any medical conditions you have. Whether you are pregnant or may be pregnant. What are the risks? Generally, this is a safe procedure. However, problems may occur, including: Pain or soreness at the  injection site. Infection at the injection site. Damage to nerves or blood vessels.  What happens before the procedure? Ask your health care provider about: Changing or stopping your regular medicines. This is especially important if you are taking diabetes medicines or blood thinners. Taking medicines such as aspirin and ibuprofen. These medicines can thin your blood. Do not take these medicines before your procedure if your health care provider instructs you not to. Follow instructions from your health care provider about eating or drinking restrictions. Plan to have someone take you home after the procedure. If you go home right after the procedure, plan to have someone with you for 24 hours. What happens during the procedure? You will be given one or more of the following: A medicine to help you relax (sedative). A medicine to numb the area (local anesthetic). You will be awake during the procedure. You will need to be able to talk with the health care provider during the procedure. With the help of a type of X-ray (fluoroscopy), the health care provider will insert a radiofrequency needle into the area to be treated. Next, a wire that carries the radio waves (electrode) will be put through the radiofrequency needle. An electrical pulse will be sent through the electrode to verify the correct nerve. You will feel a tingling sensation, and you may have muscle twitching. Then,  the tissue that is around the needle tip will be heated by an electric current that is passed using the radiofrequency machine. This will numb the nerves. A bandage (dressing) will be put on the insertion area after the procedure is done. The procedure may vary among health care providers and hospitals. What happens after the procedure? Your blood pressure, heart rate, breathing rate, and blood oxygen level will be monitored often until the medicines you were given have worn off. Return to your normal activities as  directed by your health care provider. This information is not intended to replace advice given to you by your health care provider. Make sure you discuss any questions you have with your health care provider. Document Released: 12/27/2010 Document Revised: 10/06/2015 Document Reviewed: 06/07/2014 [employer] Interactive Patient Education  [employer]. .

## 2017-03-19 NOTE — Progress Notes (Signed)
Patient's Name: Terri Werner  MRN: [id number]  Referring Provider: Leotis Shames, MD  DOB: [birth year]  PCP: Leotis Shames, MD  DOS: 03/19/2017  Note by: Edward Jolly, MD  Service setting: Ambulatory outpatient  Specialty: Interventional Pain Management  Location: [facility] (AMB) Pain Management Facility    Patient type: Established   Primary Reason(s) for Visit: Encounter for post-procedure evaluation of chronic illness with mild to moderate exacerbation CC: Back Pain (low)  HPI  Terri Werner is a 66 y.o. year old, female patient, who comes today for a post-procedure evaluation. She has HYPERTENSION, BENIGN; Screening for breast cancer; Arthralgia; Routine general medical examination at a health care facility; Hyperlipidemia; Carotid artery disorder (HCC); Diverticulitis of colon; and Lumbago on their problem list. Her primarily concern today is the Back Pain (low)  Pain Assessment: Location: Lower, Left Back Radiating: somtimes radiates down left leg Onset: More than a month ago Duration: Chronic pain Quality: Aching, Dull, Sharp, Stabbing Severity: 2 /10 (self-reported pain score)  Note: Reported level is compatible with observation.                         When using our objective Pain Scale, levels between 6 and 10/10 are said to belong in an emergency room, as it progressively worsens from a 6/10, described as severely limiting, requiring emergency care not usually available at an outpatient pain management facility. At a 6/10 level, communication becomes difficult and requires great effort. Assistance to reach the emergency department may be required. Facial flushing and profuse sweating along with potentially dangerous increases in heart rate and blood pressure will be evident. Effect on ADL:   Timing: Intermittent Modifying factors: activity  Terri Werner comes in today for post-procedure evaluation after the treatment done on 02/20/2017.  Further details  on both, my assessment(s), as well as the proposed treatment plan, please see below.  Post-Procedure Assessment  02/20/2017 Procedure: Bilateral L3/4, L4/5, L5/S1 facet medial branch nerve block #2 Pre-procedure pain score:  6/10 Post-procedure pain score: 0/10         Influential Factors: BMI: 40.79 kg/m Intra-procedural challenges: None observed.         Assessment challenges: None detected.              Reported side-effects: None.        Post-procedural adverse reactions or complications: None reported         Sedation: Please see nurses note. When no sedatives are used, the analgesic levels obtained are directly associated to the effectiveness of the local anesthetics. However, when sedation is provided, the level of analgesia obtained during the initial 1 hour following the intervention, is believed to be the result of a combination of factors. These factors may include, but are not limited to: 1. The effectiveness of the local anesthetics used. 2. The effects of the analgesic(s) and/or anxiolytic(s) used. 3. The degree of discomfort experienced by the patient at the time of the procedure. 4. The patients ability and reliability in recalling and recording the events. 5. The presence and influence of possible secondary gains and/or psychosocial factors. Reported result: Relief experienced during the 1st hour after the procedure: 50 % (Ultra-Short Term Relief)            Interpretative annotation: Clinically appropriate result. Analgesia during this period is likely to be Local Anesthetic and/or IV Sedative (Analgesic/Anxiolytic) related.          Effects of local anesthetic: The analgesic effects attained  during this period are directly associated to the localized infiltration of local anesthetics and therefore cary significant diagnostic value as to the etiological location, or anatomical origin, of the pain. Expected duration of relief is directly dependent on the pharmacodynamics of  the local anesthetic used. Long-acting (4-6 hours) anesthetics used.  Reported result: Relief during the next 4 to 6 hour after the procedure: 50 % (Short-Term Relief)            Interpretative annotation: Clinically appropriate result. Analgesia during this period is likely to be Local Anesthetic-related.          Long-term benefit: Defined as the period of time past the expected duration of local anesthetics (1 hour for short-acting and 4-6 hours for long-acting). With the possible exception of prolonged sympathetic blockade from the local anesthetics, benefits during this period are typically attributed to, or associated with, other factors such as analgesic sensory neuropraxia, antiinflammatory effects, or beneficial biochemical changes provided by agents other than the local anesthetics.  Reported result: Extended relief following procedure: 75 % (Long-Term Relief)            Interpretative annotation: Clinically appropriate result. Good relief. No permanent benefit expected. Inflammation plays a part in the etiology to the pain.          Current benefits: Defined as reported results that persistent at this point in time.   Analgesia: 50-75 % Terri Werner reports improvement of axial symptoms. Function: Somewhat improved ROM: Somewhat improved Interpretative annotation: Recurrence of symptoms. Limited therapeutic benefit. Effective diagnostic intervention.          Interpretation: Results would suggest a successful diagnostic intervention.                  Plan:  Proceed with Radiofrequency Ablation for the purpose of attaining long-term benefits.      Start with left side first.  Laboratory Chemistry  Inflammation Markers (CRP: Acute Phase) (ESR: Chronic Phase) No results found for: CRP, ESRSEDRATE               Renal Function Markers Lab Results  Component Value Date   BUN 15 07/05/2014   CREATININE 0.89 07/05/2014   GFRAA >60 07/03/2012   GFRNONAA >60 07/03/2012                  Hepatic Function Markers Lab Results  Component Value Date   AST 25 07/05/2014   ALT 24 07/05/2014   ALBUMIN 3.8 07/05/2014   ALKPHOS 52 07/05/2014   HCVAB NEGATIVE 07/06/2015                 Electrolytes Lab Results  Component Value Date   NA 142 07/05/2014   K 4.4 07/05/2014   CL 106 07/05/2014   CALCIUM 9.4 07/05/2014                 Neuropathy Markers No results found for: [id number]               Bone Pathology Markers Lab Results  Component Value Date   ALKPHOS 52 07/05/2014   VD25OH 28.98 (L) 04/15/2014   CALCIUM 9.4 07/05/2014                 Coagulation Parameters Lab Results  Component Value Date   PLT 315.0 07/05/2014                 Cardiovascular Markers Lab Results  Component Value Date   HGB 12.3 07/05/2014  HCT 36.9 07/05/2014                 Note: Lab results reviewed.  Recent Diagnostic Imaging Results  DG C-Arm 1-60 Min-No Report Fluoroscopy was utilized by the requesting physician.  No radiographic  interpretation.   Complexity Note: Imaging results reviewed. Results shared with Terri Werner, using Layman's terms.                         Meds   Current Outpatient Medications:  .  amLODipine (NORVASC) 10 MG tablet, take 1 tablet by mouth once daily, Disp: 30 tablet, Rfl: 4 .  aspirin 81 MG tablet, Take 81 mg by mouth daily., Disp: , Rfl:  .  Cholecalciferol (VITAMIN D) 2000 UNITS CAPS, Take 2,000 Units by mouth daily., Disp: , Rfl:  .  diclofenac sodium (VOLTAREN) 1 % GEL, Apply 2 g topically 4 (four) times daily. Uses a couple time a week, Disp: 100 g, Rfl: 2 .  fluticasone (FLONASE) 50 MCG/ACT nasal spray, Place 2 sprays into both nostrils daily. (Patient taking differently: Place 2 sprays into both nostrils as needed. ), Disp: 16 g, Rfl: 6 .  gabapentin (NEURONTIN) 100 MG capsule, Take 200 mg by mouth at bedtime., Disp: , Rfl:  .  HYDROcodone-acetaminophen (NORCO/VICODIN) 5-325 MG tablet, Take 1 tablet by mouth 3  (three) times daily as needed for moderate pain. (Patient taking differently: Take 1 tablet by mouth as needed for moderate pain. ), Disp: 30 tablet, Rfl: 0 .  ibuprofen (ADVIL,MOTRIN) 200 MG tablet, Take 400 mg by mouth daily., Disp: , Rfl:  .  lidocaine (LIDODERM) 5 %, Place 1 patch onto the skin daily. Remove & Discard patch within 12 hours., Disp: 30 patch, Rfl: 0 .  Magnesium 250 MG TABS, Take 250 mg by mouth daily., Disp: , Rfl:  .  meloxicam (MOBIC) 15 MG tablet, Take 1 tablet (15 mg total) by mouth daily as needed for pain., Disp: 30 tablet, Rfl: 3 .  naproxen sodium (ANAPROX) 220 MG tablet, Take 440 mg by mouth daily as needed., Disp: , Rfl:  .  nystatin cream (MYCOSTATIN), Apply 1 application topically 2 (two) times daily., Disp: , Rfl:  .  tizanidine (ZANAFLEX) 2 MG capsule, 2-4 mg BID prn spams, Disp: 60 capsule, Rfl: 2 .  traMADol (ULTRAM) 50 MG tablet, Take 1-2 tablets (50-100 mg total) by mouth every 8 (eight) hours as needed., Disp: 90 tablet, Rfl: 1 .  pravastatin (PRAVACHOL) 20 MG tablet, Take 1 tablet (20 mg total) by mouth at bedtime. (Patient not taking: Reported on 03/19/2017), Disp: 90 tablet, Rfl: 1  ROS  Constitutional: Denies any fever or chills Gastrointestinal: No reported hemesis, hematochezia, vomiting, or acute GI distress Musculoskeletal: Denies any acute onset joint swelling, redness, loss of ROM, or weakness Neurological: No reported episodes of acute onset apraxia, aphasia, dysarthria, agnosia, amnesia, paralysis, loss of coordination, or loss of consciousness  Allergies  Terri Werner is allergic to latex; lisinopril; and shrimp [shellfish allergy].  PFSH  Drug: Terri Werner  reports that she does not use drugs. Alcohol:  reports that she drinks alcohol. Tobacco:  reports that  has never smoked. she has never used smokeless tobacco. Medical:  has a past medical history of Arthritis, Colon polyps (2010), Genital warts, GERD (gastroesophageal  reflux disease), Heart murmur, Hepatitis B, History of blood transfusion, History of chicken pox, History of hiatal hernia, History of torn meniscus of right knee, HTN (hypertension), migraine  headaches, Hypertension, and Hypopotassemia. Surgical: Terri Werner  has a past surgical history that includes Tonsillectomy; Vesicovaginal fistula closure w/ TAH; Cesarean section; laproscopic Left knee repair (1971); Ectopic pregnancy surgery; Total abdominal hysterectomy w/ bilateral salpingoophorectomy (2006); Knee arthroscopy; Appendectomy (1980); Abdominal hysterectomy; Hernia repair; and COLONOSCOPY WITH PROPOFOL (N/A, 11/01/2014). Family: family history includes Alzheimer's disease in her mother; Birth defects in her maternal grandmother; Breast cancer in her sister; Cancer in her sister; Colon cancer in her maternal grandmother; Dementia in her mother; Hyperlipidemia in her maternal grandfather and unknown relative; Hypertension in her maternal grandfather and mother.  Constitutional Exam  General appearance: Well nourished, well developed, and well hydrated. In no apparent acute distress Vitals:   03/19/17 0939  BP: (!) 148/85  Pulse: 75  Resp: 18  Temp: 98.6 F (37 C)  SpO2: 100%  Weight: 223 lb (101.2 kg)  Height: 5\' 2"  (1.575 m)   BMI Assessment: Estimated body mass index is 40.79 kg/m as calculated from the following:   Height as of this encounter: 5\' 2"  (1.575 m).   Weight as of this encounter: 223 lb (101.2 kg).  BMI interpretation table: BMI level Category Range association with higher incidence of chronic pain  <18 kg/m2 Underweight   18.5-24.9 kg/m2 Ideal body weight   25-29.9 kg/m2 Overweight Increased incidence by 20%  30-34.9 kg/m2 Obese (Class I) Increased incidence by 68%  35-39.9 kg/m2 Severe obesity (Class II) Increased incidence by 136%  >40 kg/m2 Extreme obesity (Class III) Increased incidence by 254%   BMI Readings from Last 4 Encounters:  03/19/17 40.79 kg/m   02/20/17 42.07 kg/m  02/14/17 43.46 kg/m  02/06/17 42.07 kg/m   Wt Readings from Last 4 Encounters:  03/19/17 223 lb (101.2 kg)  02/20/17 230 lb (104.3 kg)  02/14/17 230 lb (104.3 kg)  02/06/17 230 lb (104.3 kg)  Psych/Mental status: Alert, oriented x 3 (person, place, & time)       Eyes: PERLA Respiratory: No evidence of acute respiratory distress  Cervical Spine Area Exam  Skin & Axial Inspection: No masses, redness, edema, swelling, or associated skin lesions Alignment: Symmetrical Functional ROM: Unrestricted ROM      Stability: No instability detected Muscle Tone/Strength: Functionally intact. No obvious neuro-muscular anomalies detected. Sensory (Neurological): Unimpaired Palpation: No palpable anomalies              Upper Extremity (UE) Exam    Side: Right upper extremity  Side: Left upper extremity  Skin & Extremity Inspection: Skin color, temperature, and hair growth are WNL. No peripheral edema or cyanosis. No masses, redness, swelling, asymmetry, or associated skin lesions. No contractures.  Skin & Extremity Inspection: Skin color, temperature, and hair growth are WNL. No peripheral edema or cyanosis. No masses, redness, swelling, asymmetry, or associated skin lesions. No contractures.  Functional ROM: Unrestricted ROM          Functional ROM: Unrestricted ROM          Muscle Tone/Strength: Functionally intact. No obvious neuro-muscular anomalies detected.  Muscle Tone/Strength: Functionally intact. No obvious neuro-muscular anomalies detected.  Sensory (Neurological): Unimpaired          Sensory (Neurological): Unimpaired          Palpation: No palpable anomalies              Palpation: No palpable anomalies              Specialized Test(s): Deferred         Specialized Test(s): Deferred  Thoracic Spine Area Exam  Skin & Axial Inspection: No masses, redness, or swelling Alignment: Symmetrical Functional ROM: Unrestricted ROM Stability: No instability  detected Muscle Tone/Strength: Functionally intact. No obvious neuro-muscular anomalies detected. Sensory (Neurological): Unimpaired Muscle strength & Tone: No palpable anomalies  Lumbar Spine Area Exam  Skin & Axial Inspection: No masses, redness, or swelling Alignment: Symmetrical Functional ROM: Unrestricted ROM      Stability: No instability detected Muscle Tone/Strength: Functionally intact. No obvious neuro-muscular anomalies detected. Sensory (Neurological): Unimpaired Palpation: No palpable anomalies       Provocative Tests: Lumbar Hyperextension and rotation test: Improved after treatment       Lumbar Lateral bending test: evaluation deferred today       Patrick's Maneuver: evaluation deferred today                    Gait & Posture Assessment  Ambulation: Unassisted Gait: Relatively normal for age and body habitus Posture: WNL   Lower Extremity Exam    Side: Right lower extremity  Side: Left lower extremity  Skin & Extremity Inspection: Skin color, temperature, and hair growth are WNL. No peripheral edema or cyanosis. No masses, redness, swelling, asymmetry, or associated skin lesions. No contractures.  Skin & Extremity Inspection: Skin color, temperature, and hair growth are WNL. No peripheral edema or cyanosis. No masses, redness, swelling, asymmetry, or associated skin lesions. No contractures.  Functional ROM: Unrestricted ROM          Functional ROM: Unrestricted ROM          Muscle Tone/Strength: Functionally intact. No obvious neuro-muscular anomalies detected.  Muscle Tone/Strength: Functionally intact. No obvious neuro-muscular anomalies detected.  Sensory (Neurological): Unimpaired  Sensory (Neurological): Unimpaired  Palpation: No palpable anomalies  Palpation: No palpable anomalies   Assessment  Primary Diagnosis & Pertinent Problem List: The primary encounter diagnosis was Lumbar spondylolysis. Diagnoses of Lumbar degenerative disc disease, Lumbar facet  arthropathy, Chronic low back pain, unspecified back pain laterality, with sciatica presence unspecified, Chronic pain syndrome, and Degeneration of lumbar or lumbosacral intervertebral disc were also pertinent to this visit.  Status Diagnosis  Responding Responding Responding 1. Lumbar spondylolysis   2. Lumbar degenerative disc disease   3. Lumbar facet arthropathy   4. Chronic low back pain, unspecified back pain laterality, with sciatica presence unspecified   5. Chronic pain syndrome   6. Degeneration of lumbar or lumbosacral intervertebral disc      66 year old female with a history of low back pain secondary to lumbar spondylosis and lumbar facet arthropathy status post bilateral lumbar facet block #2 who presents for post procedural follow-up. Patient notes significant pain relief (greater than 70%) of her low back pain, improved functionality, ability to perform activities of daily living more comfortably. She did not find physical therapy effective. Results from the two diagnostic blocks suggests successful diagnostic intervention and we will proceed with left lumbar radiofrequency ablation followed by the right.  Plan: -Left L3/4, L4/5, L5/S1 facet medial branch radiofrequency ablation under fluoroscopy followed by right. -continue gabapentin, Voltaren gel, lidocaine patches, tizanidine as previously prescribed   Lab-work, procedure(s), and/or referral(s): Orders Placed This Encounter  Procedures  . Radiofrequency,Lumbar     Provider-requested follow-up: Return for Procedure.  Future Appointments  Date Time Provider Department Center  02/07/2018  1:00 PM [facility] 3 [facility] None  02/07/2018  2:00 PM Dew, Marlow Baars, MD [facility] None    Primary Care Physician: Leotis Shames, MD Location: [facility] Note  by: Edward Jolly, M.D Date: 03/19/2017; Time: 11:56 AM  Patient Instructions  Preparing for Procedure with Sedation Instructions: . Oral  Intake: Do not eat or drink anything for at least 8 hours prior to your procedure. . Transportation: Public transportation is not allowed. Bring an adult driver. The driver must be physically present in our waiting room before any procedure can be started. Marland Kitchen Physical Assistance: Bring an adult capable of physically assisting you, in the event you need help. . Blood Pressure Medicine: Take your blood pressure medicine with a sip of water the morning of the procedure. . Insulin: Take only  of your normal insulin dose. . Preventing infections: Shower with an antibacterial soap the morning of your procedure. . Build-up your immune system: Take 1000 mg of Vitamin C with every meal (3 times a day) the day prior to your procedure. . Pregnancy: If you are pregnant, call and cancel the procedure. . Sickness: If you have a cold, fever, or any active infections, call and cancel the procedure. . Arrival: You must be in the facility at least 30 minutes prior to your scheduled procedure. . Children: Do not bring children with you. . Dress appropriately: Bring dark clothing that you would not mind if they get stained. . Valuables: Do not bring any jewelry or valuables. Procedure appointments are reserved for interventional treatments only. Marland Kitchen No Prescription Refills. . No medication changes will be discussed during procedure appointments. No disability issues will be discussed.      Radiofrequency Lesioning Radiofrequency lesioning is a procedure that is performed to relieve pain. The procedure is often used for back, neck, or arm pain. Radiofrequency lesioning involves the use of a machine that creates radio waves to make heat. During the procedure, the heat is applied to the nerve that carries the pain signal. The heat damages the nerve and interferes with the pain signal. Pain relief usually starts about 2 weeks after the procedure and lasts for 6 months to 1 year. Tell a health care provider  about:  Any allergies you have.  All medicines you are taking, including vitamins, herbs, eye drops, creams, and over-the-counter medicines.  Any problems you or family members have had with anesthetic medicines.  Any blood disorders you have.  Any surgeries you have had.  Any medical conditions you have.  Whether you are pregnant or may be pregnant. What are the risks? Generally, this is a safe procedure. However, problems may occur, including:  Pain or soreness at the injection site.  Infection at the injection site.  Damage to nerves or blood vessels.  What happens before the procedure?  Ask your health care provider about: ? Changing or stopping your regular medicines. This is especially important if you are taking diabetes medicines or blood thinners. ? Taking medicines such as aspirin and ibuprofen. These medicines can thin your blood. Do not take these medicines before your procedure if your health care provider instructs you not to.  Follow instructions from your health care provider about eating or drinking restrictions.  Plan to have someone take you home after the procedure.  If you go home right after the procedure, plan to have someone with you for 24 hours. What happens during the procedure?  You will be given one or more of the following: ? A medicine to help you relax (sedative). ? A medicine to numb the area (local anesthetic).  You will be awake during the procedure. You will need to be able to talk with the  health care provider during the procedure.  With the help of a type of X-ray (fluoroscopy), the health care provider will insert a radiofrequency needle into the area to be treated.  Next, a wire that carries the radio waves (electrode) will be put through the radiofrequency needle. An electrical pulse will be sent through the electrode to verify the correct nerve. You will feel a tingling sensation, and you may have muscle twitching.  Then, the  tissue that is around the needle tip will be heated by an electric current that is passed using the radiofrequency machine. This will numb the nerves.  A bandage (dressing) will be put on the insertion area after the procedure is done. The procedure may vary among health care providers and hospitals. What happens after the procedure?  Your blood pressure, heart rate, breathing rate, and blood oxygen level will be monitored often until the medicines you were given have worn off.  Return to your normal activities as directed by your health care provider. This information is not intended to replace advice given to you by your health care provider. Make sure you discuss any questions you have with your health care provider. Document Released: 12/27/2010 Document Revised: 10/06/2015 Document Reviewed: 06/07/2014 Elsevier Interactive Patient Education  2018 [employer] Radiofrequency Lesioning Radiofrequency lesioning is a procedure that is performed to relieve pain. The procedure is often used for back, neck, or arm pain. Radiofrequency lesioning involves the use of a machine that creates radio waves to make heat. During the procedure, the heat is applied to the nerve that carries the pain signal. The heat damages the nerve and interferes with the pain signal. Pain relief usually starts about 2 weeks after the procedure and lasts for 6 months to 1 year. Tell a health care provider about: Any allergies you have. All medicines you are taking, including vitamins, herbs, eye drops, creams, and over-the-counter medicines. Any problems you or family members have had with anesthetic medicines. Any blood disorders you have. Any surgeries you have had. Any medical conditions you have. Whether you are pregnant or may be pregnant. What are the risks? Generally, this is a safe procedure. However, problems may occur, including: Pain or soreness at the injection site. Infection at the injection  site. Damage to nerves or blood vessels.  What happens before the procedure? Ask your health care provider about: Changing or stopping your regular medicines. This is especially important if you are taking diabetes medicines or blood thinners. Taking medicines such as aspirin and ibuprofen. These medicines can thin your blood. Do not take these medicines before your procedure if your health care provider instructs you not to. Follow instructions from your health care provider about eating or drinking restrictions. Plan to have someone take you home after the procedure. If you go home right after the procedure, plan to have someone with you for 24 hours. What happens during the procedure? You will be given one or more of the following: A medicine to help you relax (sedative). A medicine to numb the area (local anesthetic). You will be awake during the procedure. You will need to be able to talk with the health care provider during the procedure. With the help of a type of X-ray (fluoroscopy), the health care provider will insert a radiofrequency needle into the area to be treated. Next, a wire that carries the radio waves (electrode) will be put through the radiofrequency needle. An electrical pulse will be sent through the electrode to verify the correct  nerve. You will feel a tingling sensation, and you may have muscle twitching. Then, the tissue that is around the needle tip will be heated by an electric current that is passed using the radiofrequency machine. This will numb the nerves. A bandage (dressing) will be put on the insertion area after the procedure is done. The procedure may vary among health care providers and hospitals. What happens after the procedure? Your blood pressure, heart rate, breathing rate, and blood oxygen level will be monitored often until the medicines you were given have worn off. Return to your normal activities as directed by your health care provider. This  information is not intended to replace advice given to you by your health care provider. Make sure you discuss any questions you have with your health care provider. Document Released: 12/27/2010 Document Revised: 10/06/2015 Document Reviewed: 06/07/2014 Elsevier Interactive Patient Education  [employer]. .

## 2017-04-10 ENCOUNTER — Encounter: Payer: Self-pay | Admitting: Student in an Organized Health Care Education/Training Program

## 2017-04-10 ENCOUNTER — Ambulatory Visit
Admission: RE | Admit: 2017-04-10 | Discharge: 2017-04-10 | Disposition: A | Discharge status: Home / Self Care | Payer: Managed Care, Other (non HMO) | Source: Ambulatory Visit | Attending: Student in an Organized Health Care Education/Training Program | Admitting: Student in an Organized Health Care Education/Training Program

## 2017-04-10 ENCOUNTER — Ambulatory Visit (HOSPITAL_BASED_OUTPATIENT_CLINIC_OR_DEPARTMENT_OTHER): Payer: Managed Care, Other (non HMO) | Admitting: Student in an Organized Health Care Education/Training Program

## 2017-04-10 DIAGNOSIS — M25552 Pain in left hip: Secondary | ICD-10-CM | POA: Diagnosis not present

## 2017-04-10 DIAGNOSIS — M4306 Spondylolysis, lumbar region: Secondary | ICD-10-CM | POA: Diagnosis not present

## 2017-04-10 DIAGNOSIS — Z6841 Body Mass Index (BMI) 40.0 and over, adult: Secondary | ICD-10-CM | POA: Diagnosis not present

## 2017-04-10 DIAGNOSIS — Z91013 Allergy to seafood: Secondary | ICD-10-CM | POA: Insufficient documentation

## 2017-04-10 DIAGNOSIS — Z90722 Acquired absence of ovaries, bilateral: Secondary | ICD-10-CM | POA: Insufficient documentation

## 2017-04-10 DIAGNOSIS — Z91048 Other nonmedicinal substance allergy status: Secondary | ICD-10-CM | POA: Insufficient documentation

## 2017-04-10 DIAGNOSIS — M549 Dorsalgia, unspecified: Secondary | ICD-10-CM | POA: Insufficient documentation

## 2017-04-10 DIAGNOSIS — Z888 Allergy status to other drugs, medicaments and biological substances status: Secondary | ICD-10-CM | POA: Insufficient documentation

## 2017-04-10 DIAGNOSIS — Z9889 Other specified postprocedural states: Secondary | ICD-10-CM | POA: Diagnosis not present

## 2017-04-10 MED ORDER — FENTANYL CITRATE (PF) 100 MCG/2ML IJ SOLN
25.0000 ug | INTRAMUSCULAR | Status: DC | PRN
  Administered 2017-04-10: 50 ug via INTRAVENOUS

## 2017-04-10 MED ORDER — LIDOCAINE HCL (PF) 1 % IJ SOLN
10.0000 mL | Freq: Once | INTRAMUSCULAR | Status: AC
  Administered 2017-04-10: 5 mL

## 2017-04-10 MED ORDER — DEXAMETHASONE SODIUM PHOSPHATE 10 MG/ML IJ SOLN
10.0000 mg | Freq: Once | INTRAMUSCULAR | Status: AC
  Administered 2017-04-10: 10 mg

## 2017-04-10 MED ORDER — ROPIVACAINE HCL 2 MG/ML IJ SOLN
10.0000 mL | Freq: Once | INTRAMUSCULAR | Status: AC
  Administered 2017-04-10: 10 mL

## 2017-04-10 NOTE — Progress Notes (Signed)
atient's Name: Terri Werner  MRN: [id number]  Referring Provider: Edward Jolly, MD  DOB: August 24, 1950  PCP: Leotis Shames, MD  DOS: 04/10/2017  Note by: Edward Jolly, MD  Service setting: Ambulatory outpatient  Specialty: Interventional Pain Management  Patient type: Established  Location: [facility] (AMB) Pain Management Facility  Visit type: Interventional Procedure   Primary Reason for Visit: Interventional Pain Management Treatment. CC: Hip Pain (left) and Back Pain (low)  Procedure:  Anesthesia, Analgesia, Anxiolysis:  Type: Therapeutic Medial Branch Facet Radiofrequency Ablation Region: Lumbar Level:  L3, L4, L5, Medial Branch Level(s) Laterality: Left-Sided  Type: Local Anesthesia with Moderate (Conscious) Sedation Local Anesthetic: Lidocaine 1% Route: Intravenous (IV) IV Access: Secured Sedation: Meaningful verbal contact was maintained at all times during the procedure  Indication(s): Analgesia and Anxiety   Indications: 1. Lumbar spondylolysis    Ms. Hutmacher has either failed to respond, was unable to tolerate, or simply did not get enough benefit from other more conservative therapies including, but not limited to: 1. Over-the-counter medications 2. Anti-inflammatory medications 3. Muscle relaxants 4. Membrane stabilizers 5. Opioids 6. Physical therapy 7. Modalities (Heat, ice, etc.) 8. Invasive techniques such as nerve blocks. Ms. Balder has attained more than 50% relief of the pain from a series of diagnostic injections conducted in separate occasions.  Pain Score: Pre-procedure: 10-Worst pain ever/10 Post-procedure: 3/10  Pre-op Assessment:  Ms. Pado is a 66 y.o. (year old), female patient, seen today for interventional treatment. She  has a past surgical history that includes Tonsillectomy; Vesicovaginal fistula closure w/ TAH; Cesarean section; laproscopic Left knee repair (1971); Ectopic pregnancy surgery; Total abdominal  hysterectomy w/ bilateral salpingoophorectomy (2006); Knee arthroscopy; Appendectomy (1980); Abdominal hysterectomy; Hernia repair; and Colonoscopy with propofol (N/A, 11/01/2014). Ms. Liuzzo has a current medication list which includes the following prescription(s): amlodipine, aspirin, vitamin d, diclofenac sodium, fluticasone, gabapentin, ibuprofen, lidocaine, magnesium, meloxicam, naproxen sodium, nystatin cream, hydrocodone-acetaminophen, pravastatin, tizanidine, and tramadol, and the following Facility-Administered Medications: fentanyl. Her primarily concern today is the Hip Pain (left) and Back Pain (low)  Initial Vital Signs: There were no vitals taken for this visit. BMI: Estimated body mass index is 42.07 kg/m as calculated from the following:   Height as of this encounter: 5\' 2"  (1.575 m).   Weight as of this encounter: 230 lb (104.3 kg).  Risk Assessment: Allergies: Reviewed. She is allergic to latex; lisinopril; and shrimp [shellfish allergy].  Allergy Precautions: None required Coagulopathies: Reviewed. None identified.  Blood-thinner therapy: None at this time Active Infection(s): Reviewed. None identified. Ms. Affleck is afebrile  Site Confirmation: Ms. Oryan was asked to confirm the procedure and laterality before marking the site Procedure checklist: Completed Consent: Before the procedure and under the influence of no sedative(s), amnesic(s), or anxiolytics, the patient was informed of the treatment options, risks and possible complications. To fulfill our ethical and legal obligations, as recommended by the American Medical Association's Code of Ethics, I have informed the patient of my clinical impression; the nature and purpose of the treatment or procedure; the risks, benefits, and possible complications of the intervention; the alternatives, including doing nothing; the risk(s) and benefit(s) of the alternative treatment(s) or procedure(s); and  the risk(s) and benefit(s) of doing nothing. The patient was provided information about the general risks and possible complications associated with the procedure. These may include, but are not limited to: failure to achieve desired goals, infection, bleeding, organ or nerve damage, allergic reactions, paralysis, and death. In addition, the patient was informed of those risks  and complications associated to Spine-related procedures, such as failure to decrease pain; infection (i.e.: Meningitis, epidural or intraspinal abscess); bleeding (i.e.: epidural hematoma, subarachnoid hemorrhage, or any other type of intraspinal or peri-dural bleeding); organ or nerve damage (i.e.: Any type of peripheral nerve, nerve root, or spinal cord injury) with subsequent damage to sensory, motor, and/or autonomic systems, resulting in permanent pain, numbness, and/or weakness of one or several areas of the body; allergic reactions; (i.e.: anaphylactic reaction); and/or death. Furthermore, the patient was informed of those risks and complications associated with the medications. These include, but are not limited to: allergic reactions (i.e.: anaphylactic or anaphylactoid reaction(s)); adrenal axis suppression; blood sugar elevation that in diabetics may result in ketoacidosis or comma; water retention that in patients with history of congestive heart failure may result in shortness of breath, pulmonary edema, and decompensation with resultant heart failure; weight gain; swelling or edema; medication-induced neural toxicity; particulate matter embolism and blood vessel occlusion with resultant organ, and/or nervous system infarction; and/or aseptic necrosis of one or more joints. Finally, the patient was informed that Medicine is not an exact science; therefore, there is also the possibility of unforeseen or unpredictable risks and/or possible complications that may result in a catastrophic outcome. The patient indicated having  understood very clearly. We have given the patient no guarantees and we have made no promises. Enough time was given to the patient to ask questions, all of which were answered to the patient's satisfaction. Ms. Gabe has indicated that she wanted to continue with the procedure. Attestation: I, the ordering provider, attest that I have discussed with the patient the benefits, risks, side-effects, alternatives, likelihood of achieving goals, and potential problems during recovery for the procedure that I have provided informed consent. Date: 04/10/2017; Time: 7:59 AM  Pre-Procedure Preparation:  Monitoring: As per clinic protocol. Respiration, ETCO2, SpO2, BP, heart rate and rhythm monitor placed and checked for adequate function Safety Precautions: Patient was assessed for positional comfort and pressure points before starting the procedure. Time-out: I initiated and conducted the "Time-out" before starting the procedure, as per protocol. The patient was asked to participate by confirming the accuracy of the "Time Out" information. Verification of the correct person, site, and procedure were performed and confirmed by me, the nursing staff, and the patient. "Time-out" conducted as per Joint Commission's Universal Protocol (UP.01.01.01). "Time-out" Date & Time: 04/10/2017; 0838 hrs.  Description of Procedure Process:   Position: Prone Target Area: For Lumbar Facet blocks, the target is the groove formed by the junction of the transverse process and superior articular process. For the L5 dorsal ramus, the target is the notch between superior articular process and sacral ala. Approach: Paraspinal approach. Area Prepped: Entire Posterior Lumbosacral Region Prepping solution: Hibiclens (4.0% Chlorhexidine gluconate solution) Safety Precautions: Aspiration looking for blood return was conducted prior to all injections. At no point did we inject any substances, as a needle was being advanced. No  attempts were made at seeking any paresthesias. Safe injection practices and needle disposal techniques used. Medications properly checked for expiration dates. SDV (single dose vial) medications used. Description of the Procedure: Protocol guidelines were followed. The patient was placed in position over the procedure table. The target area was identified and the area prepped in the usual manner. The skin and muscle were infiltrated with local anesthetic. Appropriate amount of time allowed to pass for local anesthetics to take effect. Radiofrequency needles were introduced to the target area using fluoroscopic guidance. Using the NeuroTherm NT1100 Radiofrequency Generator,  sensory stimulation using 50 Hz was used to locate & identify the nerve, making sure that the needle was positioned such that there was no sensory stimulation below 0.3 V or above 0.7 V. Stimulation using 2 Hz was used to evaluate the motor component. Care was taken not to lesion any nerves that demonstrated motor stimulation of the lower extremities at an output of less than 2.5 times that of the sensory threshold, or a maximum of 2.0 V. Once satisfactory placement of the needles was achieved, the numbing solution was slowly injected after negative aspiration. After waiting for at least 2 minutes, the ablation was performed at 80 degrees C for 60 seconds, using regular Radiofrequency settings. Once the procedure was completed, the needles were then removed and the area cleansed, making sure to leave some of the prepping solution back to take advantage of its long term bactericidal properties. Intra-operative Compliance: Compliant  Illustration of the posterior view of the lumbar spine and the posterior neural structures. Laminae of L2 through S1 are labeled. DPRL5, dorsal primary ramus of L5; DPRS1, dorsal primary ramus of S1; DPR3, dorsal primary ramus of L3; FJ, facet (zygapophyseal) joint L3-L4; I, inferior articular process of L4; LB1,  lateral branch of dorsal primary ramus of L1; IAB, inferior articular branches from L3 medial branch (supplies L4-L5 facet joint); IBP, intermediate branch plexus; MB3, medial branch of dorsal primary ramus of L3; NR3, third lumbar nerve root; S, superior articular process of L5; SAB, superior articular branches from L4 (supplies L4-5 facet joint also); TP3, transverse process of L3.  Vitals:   04/10/17 0907 04/10/17 0913 04/10/17 0923 04/10/17 0933  BP: (!) 146/94 (!) 133/102 137/72 138/77  Pulse: (!) 58 (!) 59 (!) 54 60  Resp: 15 14 14 18   Temp:  97.7 F (36.5 C)  97.7 F (36.5 C)  TempSrc:      SpO2: 100% 100% 99% 100%  Weight:      Height:        Start Time: 0839 hrs. End Time: 0906 hrs. Materials & Medications:  Needle(s) Type: Teflon-coated, curved tip, Radiofrequency needle(s) Gauge: 22G Length: 10cm Medication(s): We administered fentaNYL, lidocaine (PF), ropivacaine (PF) 2 mg/mL (0.2%), and dexamethasone. Please see chart orders for dosing details. After ablation, each level was injected with 0.3 cc of Decadron 10 mg/cc at each level, flush with 0.2% ropivacaine and the needles were removed. Imaging Guidance (Spinal):  Type of Imaging Technique: Fluoroscopy Guidance (Spinal) Indication(s): Assistance in needle guidance and placement for procedures requiring needle placement in or near specific anatomical locations not easily accessible without such assistance. Exposure Time: Please see nurses notes. Contrast: None used. Fluoroscopic Guidance: I was personally present during the use of fluoroscopy. "Tunnel Vision Technique" used to obtain the best possible view of the target area. Parallax error corrected before commencing the procedure. "Direction-depth-direction" technique used to introduce the needle under continuous pulsed fluoroscopy. Once target was reached, antero-posterior, oblique, and lateral fluoroscopic projection used confirm needle placement in all planes. Images  permanently stored in EMR. Interpretation: No contrast injected. I personally interpreted the imaging intraoperatively. Adequate needle placement confirmed in multiple planes. Permanent images saved into the patient's record.  Antibiotic Prophylaxis:  Indication(s): None identified Antibiotic given: None  Post-operative Assessment:  EBL: None Complications: No immediate post-treatment complications observed by team, or reported by patient. Note: The patient tolerated the entire procedure well. A repeat set of vitals were taken after the procedure and the patient was kept under observation following institutional policy,  for this type of procedure. Post-procedural neurological assessment was performed, showing return to baseline, prior to discharge. The patient was provided with post-procedure discharge instructions, including a section on how to identify potential problems. Should any problems arise concerning this procedure, the patient was given instructions to immediately contact us, at any time, without hesitation. In any case, we plan to contact the patient by telephone for a follow-up status report regarding this interventional procedure. Comments:  No additional relevant information. 5 out of 5 strength bilateral lower extremity: Plantar flexion, dorsiflexion, knee flexion, knee extension. Plan of Care   Imaging Orders     DG C-Arm 1-60 Min-No Report  Procedure Orders     Radiofrequency,Lumbar Follow up in 2 weeks for contralateral RFA.  Medications ordered for procedure: Meds ordered this encounter  Medications  . fentaNYL (SUBLIMAZE) injection 25-100 mcg    Make sure Narcan is available in the pyxis when using this medication. In the event of respiratory depression (RR< 8/min): Titrate NARCAN (naloxone) in increments of 0.1 to 0.2 mg IV at 2-3 minute intervals, until desired degree of reversal.  . lidocaine (PF) (XYLOCAINE) 1 % injection 10 mL  . ropivacaine (PF) 2 mg/mL (0.2%)  (NAROPIN) injection 10 mL  . dexamethasone (DECADRON) injection 10 mg   Medications administered: We administered fentaNYL, lidocaine (PF), ropivacaine (PF) 2 mg/mL (0.2%), and dexamethasone.  See the medical record for exact dosing, route, and time of administration.  Disposition: Discharge home  Discharge Date & Time: 04/10/2017; 0940 hrs.   Physician-requested Follow-up: Return in about 2 weeks (around 04/24/2017) for Contra-lateral RFA. Future Appointments  Date Time Provider Department Center  02/07/2018  1:00 PM [facility] 3 [facility] None  02/07/2018  2:00 PM Dew, Marlow Baars, MD [facility] None   Primary Care Physician: Leotis Shames, MD Location: [facility] Note by: Edward Jolly, MD Date: 04/10/2017; Time: 11:22 AM  Disclaimer:  Medicine is not an exact science. The only guarantee in medicine is that nothing is guaranteed. It is important to note that the decision to proceed with this intervention was based on the information collected from the patient. The Data and conclusions were drawn from the patient's questionnaire, the interview, and the physical examination. Because the information was provided in large part by the patient, it cannot be guaranteed that it has not been purposely or unconsciously manipulated. Every effort has been made to obtain as much relevant data as possible for this evaluation. It is important to note that the conclusions that lead to this procedure are derived in large part from the available data. Always take into account that the treatment will also be dependent on availability of resources and existing treatment guidelines, considered by other Pain Management Practitioners as being common knowledge and practice, at the time of the intervention. For Medico-Legal purposes, it is also important to point out that variation in procedural techniques and pharmacological choices are the acceptable norm. The indications, contraindications,  technique, and results of the above procedure should only be interpreted and judged by a Board-Certified Interventional Pain Specialist with extensive familiarity and expertise in the same exact procedure and technique.

## 2017-04-10 NOTE — Patient Instructions (Signed)
Post RF Discharge Instructions  You have just completed Radiofrequency Neurotomy.  The following instructions will provide you with information and guidelines for self-care upon discharge.  If at any time you have questions or concerns please call your physician.  General Instructions:  Be alert for signs of possible infection: redness, swelling, heat, red streaks, elevated temperature, fever.  Please notify your doctor immediately if you have unusual bleeding, trouble breathing, or loss of the ability to control your bowel or bladder.  What to expect:  Most procedures involve the use of local anesthetics (numbing medicine), steroids (anti-inflammatory medicines) and possibly sedation (relaxation or nerve medicine).  Sedation may affect your memory, not allowing you to remember the procedure, or the instructions that we give you after it.  Because of this, you doctor may want to avoid providing you important information after the procedure, since you may not remember.  The doctor will be more than happy to go over the information upon your return.  Local anesthetics, on the other hand, may cause temporary numbness and weakness of the legs or arms, depending on the location of the block.  This numbness/weaknes may last 4-6 hours ( the duration of the local anesthetic).  During this period of numbness, you must be more careful than usual to prevent any injuries to th extremity.  Steroids will begin to work immediately after injected, but on the average, it will take 6-10 days for the swelling to come down to the point where you will be able to tell a difference in terms of the pain.  In summary, you should expect for your pain to get better within 15-20 minutes after the procedure.  This relieve or numbness should last 4-6 hours, after which, it will wear off.  Once it wears off, you may experience more pain than usual for 4-6 days, or until the steroids "kick in".  This discomfort is due to the procedure  itself.  To minimize this, we recommend applying ice (fill a plastic sandwich bag with ice and wrap it on a towel to prevent frostbite) to the area, 15 minutes on and 15 minutes off, the day of the procedure.  This will minimize any swelling.  Starting the next day, you should then start with heat (moist or dry, it does not matter).  Heat therapy should continue until the pain improves (4-6 days).  Be careful not to burn yourself.  In the case of Radiofrequency procedures, you should expect more pain than usual for 5 to 6 weeks after the procedure.  This is how long it takes the burned tissue to heal.  We cannot assess any definite results on the success of the procedure until this recovery period has elapsed.  Post-Procedure Care:  You will want to be careful in moving about.  Muscle spasms in the area of the injection may occur.  Use ice or heat to the area is often helpful.   Occasionally, a spinal headache can develop.  This is different from a normal headache in the sense that it will not go away on it's own, despite normal measures.  If you develop a headache that does not seem to respond to conservative therapy, please let your physician know.  This can be treated with an epidural blood patch.   You may experience some numbness or redness,m however it should be short lived.  If persistent numbness occurs, contact your physician. (Persistent numbness would be defined as lasting more than 4-6 hours).  Use care in   moving the effected arm or leg to avoid further injury.  You may be given a sling or crutches to use temporarily.  Some discoloration may be present in your arms or leg for 24-48 hours.  If you experience shortness of breath or if your lips or fingers develop a dusky or "blue" appearance, contact your physician or go to the emergency room.  Diet  No eating limitations, unless stipulated above.  Nevertheless, if you have had sedation, you may experience some nausea.  In this case, it may be  wise to wait at least two hours prior to resuming regular diet.  Comfort  You may experience a temporary increase in pain.  You will also experience tenderness at the site of injection, which may be accompanied with muscle spasms.  Use of cold or heat is usually helpful.  Take your prescription as directed.  Always exercise caution when taking pain pills.  Activity:  For the first 24 hours after the procedure, do not drive a motor vehicle,  Operate heavy machinery or power tools or handle any weapons.  Consider walking with the use of an assistive device or accompanied by an adult for the next 24 hours.  Do not drink alcoholic beverages including beer.  Do not make any important decisions or sign any legal documents. Go home and rest today.  Resume activities tomorrow, as tolerated.  Use caution in moving about as you may experience mild leg weakness.  Use caution in cooking, use of household electrical appliances and climbing steps.  Medications  May resume pre-procedure medications.  Do not take any drugs, other than what has been prescribed to you.   Other GENERAL RISKS AND COMPLICATIONS  What are the risk, side effects and possible complications? Generally speaking, most procedures are safe.  However, with any procedure there are risks, side effects, and the possibility of complications.  The risks and complications are dependent upon the sites that are lesioned, or the type of nerve block to be performed.  The closer the procedure is to the spine, the more serious the risks are.  Great care is taken when placing the radio frequency needles, block needles or lesioning probes, but sometimes complications can occur. 1. Infection: Any time there is an injection through the skin, there is a risk of infection.  This is why sterile conditions are used for these blocks.  There are four possible types of infection. 1. Localized skin infection. 2. Central Nervous System Infection-This can be in the form  of Meningitis, which can be deadly. 3. Epidural Infections-This can be in the form of an epidural abscess, which can cause pressure inside of the spine, causing compression of the spinal cord with subsequent paralysis. This would require an emergency surgery to decompress, and there are no guarantees that the patient would recover from the paralysis. 4. Discitis-This is an infection of the intervertebral discs.  It occurs in about 1% of discography procedures.  It is difficult to treat and it may lead to surgery.        2. Pain: the needles have to go through skin and soft tissues, will cause soreness.       3. Damage to internal structures:  The nerves to be lesioned may be near blood vessels or    other nerves which can be potentially damaged.       4. Bleeding: Bleeding is more common if the patient is taking blood thinners such as  aspirin, Coumadin, Ticiid, Plavix, etc., or if  he/she have some genetic predisposition  such as hemophilia. Bleeding into the spinal canal can cause compression of the spinal  cord with subsequent paralysis.  This would require an emergency surgery to  decompress and there are no guarantees that the patient would recover from the  paralysis.       5. Pneumothorax:  Puncturing of a lung is a possibility, every time a needle is introduced in  the area of the chest or upper back.  Pneumothorax refers to free air around the  collapsed lung(s), inside of the thoracic cavity (chest cavity).  Another two possible  complications related to a similar event would include: Hemothorax and Chylothorax.   These are variations of the Pneumothorax, where instead of air around the collapsed  lung(s), you may have blood or chyle, respectively.       6. Spinal headaches: They may occur with any procedures in the area of the spine.       7. Persistent CSF (Cerebro-Spinal Fluid) leakage: This is a rare problem, but may occur  with prolonged intrathecal or epidural catheters either due to the  formation of a fistulous  track or a dural tear.       8. Nerve damage: By working so close to the spinal cord, there is always a possibility of  nerve damage, which could be as serious as a permanent spinal cord injury with  paralysis.       9. Death:  Although rare, severe deadly allergic reactions known as "Anaphylactic  reaction" can occur to any of the medications used.      10. Worsening of the symptoms:  We can always make thing worse.  What are the chances of something like this happening? Chances of any of this occuring are extremely low.  By statistics, you have more of a chance of getting killed in a motor vehicle accident: while driving to the hospital than any of the above occurring .  Nevertheless, you should be aware that they are possibilities.  In general, it is similar to taking a shower.  Everybody knows that you can slip, hit your head and get killed.  Does that mean that you should not shower again?  Nevertheless always keep in mind that statistics do not mean anything if you happen to be on the wrong side of them.  Even if a procedure has a 1 (one) in a 1,000,000 (million) chance of going wrong, it you happen to be that one..Also, keep in mind that by statistics, you have more of a chance of having something go wrong when taking medications.  Who should not have this procedure? If you are on a blood thinning medication (e.g. Coumadin, Plavix, see list of "Blood Thinners"), or if you have an active infection going on, you should not have the procedure.  If you are taking any blood thinners, please inform your physician.  How should I prepare for this procedure?  Do not eat or drink anything at least six hours prior to the procedure.  Bring a driver with you .  It cannot be a taxi.  Come accompanied by an adult that can drive you back, and that is strong enough to help you if your legs get weak or numb from the local anesthetic.  Take all of your medicines the morning of the  procedure with just enough water to swallow them.  If you have diabetes, make sure that you are scheduled to have your procedure done first thing in the  morning, whenever possible.  If you have diabetes, take only half of your insulin dose and notify our nurse that you have done so as soon as you arrive at the clinic.  If you are diabetic, but only take blood sugar pills (oral hypoglycemic), then do not take them on the morning of your procedure.  You may take them after you have had the procedure.  Do not take aspirin or any aspirin-containing medications, at least eleven (11) days prior to the procedure.  They may prolong bleeding.  Wear loose fitting clothing that may be easy to take off and that you would not mind if it got stained with Betadine or blood.  Do not wear any jewelry or perfume  Remove any nail coloring.  It will interfere with some of our monitoring equipment.  NOTE: Remember that this is not meant to be interpreted as a complete list of all possible complications.  Unforeseen problems may occur.  BLOOD THINNERS The following drugs contain aspirin or other products, which can cause increased bleeding during surgery and should not be taken for 2 weeks prior to and 1 week after surgery.  If you should need take something for relief of minor pain, you may take acetaminophen which is found in Tylenol,m Datril, Anacin-3 and Panadol. It is not blood thinner. The products listed below are.  Do not take any of the products listed below in addition to any listed on your instruction sheet.  A.P.C or A.P.C with Codeine Codeine Phosphate Capsules #3 Ibuprofen Ridaura  ABC compound Congesprin Imuran rimadil  Advil Cope Indocin Robaxisal  Alka-Seltzer Effervescent Pain Reliever and Antacid Coricidin or Coricidin-D  Indomethacin Rufen  Alka-Seltzer plus Cold Medicine Cosprin Ketoprofen S-A-C Tablets  Anacin Analgesic Tablets or Capsules Coumadin Korlgesic Salflex  Anacin Extra Strength  Analgesic tablets or capsules CP-2 Tablets Lanoril Salicylate  Anaprox Cuprimine Capsules Levenox Salocol  Anexsia-D Dalteparin Magan Salsalate  Anodynos Darvon compound Magnesium Salicylate Sine-off  Ansaid Dasin Capsules Magsal Sodium Salicylate  Anturane Depen Capsules Marnal Soma  APF Arthritis pain formula Dewitt's Pills Measurin Stanback  Argesic Dia-Gesic Meclofenamic Sulfinpyrazone  Arthritis Bayer Timed Release Aspirin Diclofenac Meclomen Sulindac  Arthritis pain formula Anacin Dicumarol Medipren Supac  Analgesic (Safety coated) Arthralgen Diffunasal Mefanamic Suprofen  Arthritis Strength Bufferin Dihydrocodeine Mepro Compound Suprol  Arthropan liquid Dopirydamole Methcarbomol with Aspirin Synalgos  ASA tablets/Enseals Disalcid Micrainin Tagament  Ascriptin Doan's Midol Talwin  Ascriptin A/D Dolene Mobidin Tanderil  Ascriptin Extra Strength Dolobid Moblgesic Ticlid  Ascriptin with Codeine Doloprin or Doloprin with Codeine Momentum Tolectin  Asperbuf Duoprin Mono-gesic Trendar  Aspergum Duradyne Motrin or Motrin IB Triminicin  Aspirin plain, buffered or enteric coated Durasal Myochrisine Trigesic  Aspirin Suppositories Easprin Nalfon Trillsate  Aspirin with Codeine Ecotrin Regular or Extra Strength Naprosyn Uracel  Atromid-S Efficin Naproxen Ursinus  Auranofin Capsules Elmiron Neocylate Vanquish  Axotal Emagrin Norgesic Verin  Azathioprine Empirin or Empirin with Codeine Normiflo Vitamin E  Azolid Emprazil Nuprin Voltaren  Bayer Aspirin plain, buffered or children's or timed BC Tablets or powders Encaprin Orgaran Warfarin Sodium  Buff-a-Comp Enoxaparin Orudis Zorpin  Buff-a-Comp with Codeine Equegesic Os-Cal-Gesic   Buffaprin Excedrin plain, buffered or Extra Strength Oxalid   Bufferin Arthritis Strength Feldene Oxphenbutazone   Bufferin plain or Extra Strength Feldene Capsules Oxycodone with Aspirin   Bufferin with Codeine Fenoprofen Fenoprofen Pabalate or Pabalate-SF    Buffets II Flogesic Panagesic   Buffinol plain or Extra Strength Florinal or Florinal with Codeine Panwarfarin   Buf-Tabs Flurbiprofen  Penicillamine   Butalbital Compound Four-way cold tablets Penicillin   Butazolidin Fragmin Pepto-Bismol   Carbenicillin Geminisyn Percodan   Carna Arthritis Reliever Geopen Persantine   Carprofen Gold's salt Persistin   Chloramphenicol Goody's Phenylbutazone   Chloromycetin Haltrain Piroxlcam   Clmetidine heparin Plaquenil   Cllnoril Hyco-pap Ponstel   Clofibrate Hydroxy chloroquine Propoxyphen         Before stopping any of these medications, be sure to consult the physician who ordered them.  Some, such as Coumadin (Warfarin) are ordered to prevent or treat serious conditions such as "deep thrombosis", "pumonary embolisms", and other heart problems.  The amount of time that you may need off of the medication may also vary with the medication and the reason for which you were taking it.  If you are taking any of these medications, please make sure you notify your pain physician before you undergo any procedures.

## 2017-04-10 NOTE — Progress Notes (Signed)
Safety precautions to be maintained throughout the outpatient stay will include: orient to surroundings, keep bed in low position, maintain call bell within reach at all times, provide assistance with transfer out of bed and ambulation.  

## 2017-04-11 ENCOUNTER — Telehealth: Payer: Self-pay | Admitting: *Deleted

## 2017-04-11 NOTE — Telephone Encounter (Signed)
Denies problems post procedure. 

## 2017-04-17 DIAGNOSIS — M150 Primary generalized (osteo)arthritis: Secondary | ICD-10-CM

## 2017-04-17 DIAGNOSIS — M159 Polyosteoarthritis, unspecified: Secondary | ICD-10-CM | POA: Insufficient documentation

## 2017-04-24 ENCOUNTER — Ambulatory Visit (HOSPITAL_BASED_OUTPATIENT_CLINIC_OR_DEPARTMENT_OTHER): Payer: Managed Care, Other (non HMO) | Admitting: Student in an Organized Health Care Education/Training Program

## 2017-04-24 ENCOUNTER — Ambulatory Visit
Admission: RE | Admit: 2017-04-24 | Discharge: 2017-04-24 | Disposition: A | Discharge status: Home / Self Care | Payer: Managed Care, Other (non HMO) | Source: Ambulatory Visit | Attending: Student in an Organized Health Care Education/Training Program | Admitting: Student in an Organized Health Care Education/Training Program

## 2017-04-24 ENCOUNTER — Encounter: Payer: Self-pay | Admitting: Student in an Organized Health Care Education/Training Program

## 2017-04-24 DIAGNOSIS — Z888 Allergy status to other drugs, medicaments and biological substances status: Secondary | ICD-10-CM | POA: Diagnosis not present

## 2017-04-24 DIAGNOSIS — Z79899 Other long term (current) drug therapy: Secondary | ICD-10-CM | POA: Diagnosis not present

## 2017-04-24 DIAGNOSIS — Z79891 Long term (current) use of opiate analgesic: Secondary | ICD-10-CM | POA: Insufficient documentation

## 2017-04-24 DIAGNOSIS — M4306 Spondylolysis, lumbar region: Secondary | ICD-10-CM

## 2017-04-24 DIAGNOSIS — M79604 Pain in right leg: Secondary | ICD-10-CM | POA: Diagnosis not present

## 2017-04-24 DIAGNOSIS — F419 Anxiety disorder, unspecified: Secondary | ICD-10-CM | POA: Insufficient documentation

## 2017-04-24 DIAGNOSIS — Z91013 Allergy to seafood: Secondary | ICD-10-CM | POA: Diagnosis not present

## 2017-04-24 DIAGNOSIS — Z90710 Acquired absence of both cervix and uterus: Secondary | ICD-10-CM | POA: Diagnosis not present

## 2017-04-24 DIAGNOSIS — M545 Low back pain: Secondary | ICD-10-CM | POA: Diagnosis not present

## 2017-04-24 DIAGNOSIS — Z7982 Long term (current) use of aspirin: Secondary | ICD-10-CM | POA: Diagnosis not present

## 2017-04-24 DIAGNOSIS — Z7951 Long term (current) use of inhaled steroids: Secondary | ICD-10-CM | POA: Insufficient documentation

## 2017-04-24 DIAGNOSIS — Z91040 Latex allergy status: Secondary | ICD-10-CM | POA: Insufficient documentation

## 2017-04-24 MED ORDER — FENTANYL CITRATE (PF) 100 MCG/2ML IJ SOLN
25.0000 ug | INTRAMUSCULAR | Status: DC | PRN
  Administered 2017-04-24: 75 ug via INTRAVENOUS
  Filled 2017-04-24: qty 2

## 2017-04-24 MED ORDER — LIDOCAINE HCL (PF) 1 % IJ SOLN
10.0000 mL | Freq: Once | INTRAMUSCULAR | Status: AC
  Administered 2017-04-24: 5 mL
  Filled 2017-04-24: qty 10

## 2017-04-24 MED ORDER — ROPIVACAINE HCL 2 MG/ML IJ SOLN
9.0000 mL | Freq: Once | INTRAMUSCULAR | Status: AC
  Administered 2017-04-24: 10 mL via PERINEURAL
  Filled 2017-04-24: qty 10

## 2017-04-24 MED ORDER — DEXAMETHASONE SODIUM PHOSPHATE 10 MG/ML IJ SOLN
10.0000 mg | Freq: Once | INTRAMUSCULAR | Status: AC
  Administered 2017-04-24: 10 mg
  Filled 2017-04-24: qty 1

## 2017-04-24 MED ORDER — LACTATED RINGERS IV SOLN
1000.0000 mL | Freq: Once | INTRAVENOUS | Status: AC
  Administered 2017-04-24: 1000 mL via INTRAVENOUS

## 2017-04-24 NOTE — Patient Instructions (Signed)
Post-procedure Information What to expect: Most procedures involve the use of a local anesthetic (numbing medicine), and a steroid (anti-inflammatory medicine).  The local anesthetics may cause temporary numbness and weakness of the legs or arms, depending on the location of the block. This numbness/weakness may last 4-6 hours, depending on the local anesthetic used. In rare instances, it can last up to 24 hours. While numb, you must be very careful not to injure the extremity.  After any procedure, you could expect the pain to get better within 15-20 minutes. This relief is temporary and may last 4-6 hours. Once the local anesthetics wears off, you could experience discomfort, possibly more than usual, for up to 10 (ten) days. In the case of radiofrequencies, it may last up to 6 weeks. Surgeries may take up to 8 weeks for the healing process. The discomfort is due to the irritation caused by needles going through skin and muscle. To minimize the discomfort, we recommend using ice the first day, and heat from then on. The ice should be applied for 15 minutes on, and 15 minutes off. Keep repeating this cycle until bedtime. Avoid applying the ice directly to the skin, to prevent frostbite. Heat should be used daily, until the pain improves (4-10 days). Be careful not to burn yourself.  Occasionally you may experience muscle spasms or cramps. These occur as a consequence of the irritation caused by the needle sticks to the muscle and the blood that will inevitably be lost into the surrounding muscle tissue. Blood tends to be very irritating to tissues, which tend to react by going into spasm. These spasms may start the same day of your procedure, but they may also take days to develop. This late onset type of spasm or cramp is usually caused by electrolyte imbalances triggered by the steroids, at the level of the kidney. Cramps and spasms tend to respond well to muscle relaxants, multivitamins (some are  triggered by the procedure, but may have their origins in vitamin deficiencies), and "Gatorade", or any sports drinks that can replenish any electrolyte imbalances. (If you are a diabetic, ask your pharmacist to get you a sugar-free brand.) Warm showers or baths may also be helpful. Stretching exercises are highly recommended. General Instructions:  Be alert for signs of possible infection: redness, swelling, heat, red streaks, elevated temperature, and/or fever. These typically appear 4 to 6 days after the procedure. Immediately notify your doctor if you experience unusual bleeding, difficulty breathing, or loss of bowel or bladder control. If you experience increased pain, do not increase your pain medicine intake, unless instructed by your pain physician. Post-Procedure Care:  Be careful in moving about. Muscle spasms in the area of the injection may occur. Applying ice or heat to the area is often helpful. The incidence of spinal headaches after epidural injections ranges between 1.4% and 6%. If you develop a headache that does not seem to respond to conservative therapy, please let your physician know. This can be treated with an epidural blood patch.   Post-procedure numbness or redness is to be expected, however it should average 4 to 6 hours. If numbness and weakness of your extremities begins to develop 4 to 6 hours after your procedure, and is felt to be progressing and worsening, immediately contact your physician.   Diet:  If you experience nausea, do not eat until this sensation goes away. If you had a "Stellate Ganglion Block" for upper extremity "Reflex Sympathetic Dystrophy", do not eat or drink until your   hoarseness goes away. In any case, always start with liquids first and if you tolerate them well, then slowly progress to more solid foods. Activity:  For the first 4 to 6 hours after the procedure, use caution in moving about as you may experience numbness and/or weakness. Use caution in  cooking, using household electrical appliances, and climbing steps. If you need to reach your Doctor call our office: [phone] Monday-Thursday 8:00 am - 4:00 PM    Fridays: Closed     In case of an emergency: In case of emergency, call 911 or go to the nearest emergency room and have the physician there call us.  Interpretation of Procedure Every nerve block has two components: a diagnostic component, and a treatment component. Unrealistic expectations are the most common causes of "perceived failure".  In a perfect world, a single nerve block should be able to completely and permanently eliminate the pain. Sadly, the world is not perfect.  Most pain management nerve blocks are performed using local anesthetics and steroids. Steroids are responsible for any long-term benefit that you may experience. Their purpose is to decrease any chronic swelling that may exist in the area. Steroids begin to work immediately after being injected. However, most patients will not experience any benefits until 5 to 10 days after the injection, when the swelling has come down to the point where they can tell a difference. Steroids will only help if there is swelling to be treated. As such, they can assist with the diagnosis. If effective, they suggest an inflammatory component to the pain, and if ineffective, they rule out inflammation as the main cause or component of the problem. If the problem is one of mechanical compression, you will get no benefit from those steroids.   In the case of local anesthetics, they have a crucial role in the diagnosis of your condition. Most will begin to work within15 to 20 minutes after injection. The duration will depend on the type used (short- vs. Long-acting). It is of outmost importance that patients keep tract of their pain, after the procedure. To assist with this matter, a "Post-procedure Pain Diary" is provided. Make sure to complete it and to bring it back to your  follow-up appointment.  As long as the patient keeps accurate, detailed records of their symptoms after every procedure, and returns to have those interpreted, every procedure will provide us with invaluable information. Even a block that does not provide the patient with any relief, will always provide us with information about the mechanism and the origin of the pain. The only time a nerve block can be considered a waste of time is when patients do not keep track of the results, or do not keep their post-procedure appointment.  Reporting the results back to your physician The Pain Score  Pain is a subjective complaint. It cannot be seen, touched, or measured. We depend entirely on the patient's report of the pain in order to assess your condition and treatment. To evaluate the pain, we use a pain scale, where "0" means "No Pain", and a "10" is "the worst possible pain that you can even imagine" (i.e. something like been eaten alive by a shark or being torn apart by a lion).   You will frequently be asked to rate your pain. Please be as accurate, remember that medical decisions will be based on your responses. Please do not rate your pain above a 10. Doing so is actually interpreted as "symptom magnification" (exaggeration), as   well as lack of understanding with regards to the scale. To put this into perspective, when you tell us that your pain is at a 10 (ten), what you are saying is that there is nothing we can do to make this pain any worse. (Carefully think about that.)Radiofrequency Lesioning Radiofrequency lesioning is a procedure that is performed to relieve pain. The procedure is often used for back, neck, or arm pain. Radiofrequency lesioning involves the use of a machine that creates radio waves to make heat. During the procedure, the heat is applied to the nerve that carries the pain signal. The heat damages the nerve and interferes with the pain signal. Pain relief usually starts about 2 weeks  after the procedure and lasts for 6 months to 1 year. Tell a health care provider about:  Any allergies you have.  All medicines you are taking, including vitamins, herbs, eye drops, creams, and over-the-counter medicines.  Any problems you or family members have had with anesthetic medicines.  Any blood disorders you have.  Any surgeries you have had.  Any medical conditions you have.  Whether you are pregnant or may be pregnant. What are the risks? Generally, this is a safe procedure. However, problems may occur, including:  Pain or soreness at the injection site.  Infection at the injection site.  Damage to nerves or blood vessels.  What happens before the procedure?  Ask your health care provider about: ? Changing or stopping your regular medicines. This is especially important if you are taking diabetes medicines or blood thinners. ? Taking medicines such as aspirin and ibuprofen. These medicines can thin your blood. Do not take these medicines before your procedure if your health care provider instructs you not to.  Follow instructions from your health care provider about eating or drinking restrictions.  Plan to have someone take you home after the procedure.  If you go home right after the procedure, plan to have someone with you for 24 hours. What happens during the procedure?  You will be given one or more of the following: ? A medicine to help you relax (sedative). ? A medicine to numb the area (local anesthetic).  You will be awake during the procedure. You will need to be able to talk with the health care provider during the procedure.  With the help of a type of X-ray (fluoroscopy), the health care provider will insert a radiofrequency needle into the area to be treated.  Next, a wire that carries the radio waves (electrode) will be put through the radiofrequency needle. An electrical pulse will be sent through the electrode to verify the correct nerve. You  will feel a tingling sensation, and you may have muscle twitching.  Then, the tissue that is around the needle tip will be heated by an electric current that is passed using the radiofrequency machine. This will numb the nerves.  A bandage (dressing) will be put on the insertion area after the procedure is done. The procedure may vary among health care providers and hospitals. What happens after the procedure?  Your blood pressure, heart rate, breathing rate, and blood oxygen level will be monitored often until the medicines you were given have worn off.  Return to your normal activities as directed by your health care provider. This information is not intended to replace advice given to you by your health care provider. Make sure you discuss any questions you have with your health care provider. Document Released: 12/27/2010 Document Revised: 10/06/2015 Document Reviewed:   06/07/2014 [employer] Interactive Patient Education  2018 [employer]  

## 2017-04-24 NOTE — Progress Notes (Signed)
atient's Name: Terri Werner  MRN: [id number]  Referring Provider: Edward Jolly, MD  DOB: December 09, 1950  PCP: Leotis Shames, MD  DOS: 04/24/2017  Note by: Edward Jolly, MD  Service setting: Ambulatory outpatient  Specialty: Interventional Pain Management  Patient type: Established  Location: [facility] (AMB) Pain Management Facility  Visit type: Interventional Procedure   Primary Reason for Visit: Interventional Pain Management Treatment. CC: Back Pain (lower back right side) and Leg Pain (right)  Procedure:  Anesthesia, Analgesia, Anxiolysis:  Type: Therapeutic Medial Branch Facet Radiofrequency Ablation Region: Lumbar Level:  L3, L4, L5, Medial Branch Level(s) Laterality: Right-Sided  Type: Local Anesthesia with Moderate (Conscious) Sedation Local Anesthetic: Lidocaine 1% Route: Intravenous (IV) IV Access: Secured Sedation: Meaningful verbal contact was maintained at all times during the procedure  Indication(s): Analgesia and Anxiety   Indications: 1. Lumbar spondylolysis    [name] has either failed to respond, was unable to tolerate, or simply did not get enough benefit from other more conservative therapies including, but not limited to: 1. Over-the-counter medications 2. Anti-inflammatory medications 3. Muscle relaxants 4. Membrane stabilizers 5. Opioids 6. Physical therapy 7. Modalities (Heat, ice, etc.) 8. Invasive techniques such as nerve blocks. [name] has attained more than 50% relief of the pain from a series of diagnostic injections conducted in separate occasions.  Pain Score: Pre-procedure: 2 /10 Post-procedure: 3/10  Pre-op Assessment:  [name] is a 66 y.o. (year old), female patient, seen today for interventional treatment. She  has a past surgical history that includes Tonsillectomy; Vesicovaginal fistula closure w/ TAH; Cesarean section; laproscopic Left knee repair (1971); Ectopic pregnancy surgery; Total  abdominal hysterectomy w/ bilateral salpingoophorectomy (2006); Knee arthroscopy; Appendectomy (1980); Abdominal hysterectomy; Hernia repair; and Colonoscopy with propofol (N/A, 11/01/2014). [name] has a current medication list which includes the following prescription(s): amlodipine, aspirin, vitamin d, diclofenac sodium, fluticasone, gabapentin, hydrochlorothiazide, ibuprofen, lidocaine, meloxicam, naproxen sodium, tizanidine, hydrocodone-acetaminophen, magnesium, nystatin cream, pravastatin, and tramadol, and the following Facility-Administered Medications: fentanyl. Her primarily concern today is the Back Pain (lower back right side) and Leg Pain (right)  Initial Vital Signs: There were no vitals taken for this visit. BMI: Estimated body mass index is 40.97 kg/m as calculated from the following:   Height as of this encounter: 5\' 2"  (1.575 m).   Weight as of this encounter: 224 lb (101.6 kg).  Risk Assessment: Allergies: Reviewed. She is allergic to latex; lisinopril; and shrimp [shellfish allergy].  Allergy Precautions: None required Coagulopathies: Reviewed. None identified.  Blood-thinner therapy: None at this time Active Infection(s): Reviewed. None identified. [name] is afebrile  Site Confirmation: [name] was asked to confirm the procedure and laterality before marking the site Procedure checklist: Completed Consent: Before the procedure and under the influence of no sedative(s), amnesic(s), or anxiolytics, the patient was informed of the treatment options, risks and possible complications. To fulfill our ethical and legal obligations, as recommended by the American Medical Association's Code of Ethics, I have informed the patient of my clinical impression; the nature and purpose of the treatment or procedure; the risks, benefits, and possible complications of the intervention; the alternatives, including doing nothing; the risk(s) and benefit(s) of  the alternative treatment(s) or procedure(s); and the risk(s) and benefit(s) of doing nothing. The patient was provided information about the general risks and possible complications associated with the procedure. These may include, but are not limited to: failure to achieve desired goals, infection, bleeding, organ or nerve damage, allergic reactions, paralysis, and death. In addition, the  patient was informed of those risks and complications associated to Spine-related procedures, such as failure to decrease pain; infection (i.e.: Meningitis, epidural or intraspinal abscess); bleeding (i.e.: epidural hematoma, subarachnoid hemorrhage, or any other type of intraspinal or peri-dural bleeding); organ or nerve damage (i.e.: Any type of peripheral nerve, nerve root, or spinal cord injury) with subsequent damage to sensory, motor, and/or autonomic systems, resulting in permanent pain, numbness, and/or weakness of one or several areas of the body; allergic reactions; (i.e.: anaphylactic reaction); and/or death. Furthermore, the patient was informed of those risks and complications associated with the medications. These include, but are not limited to: allergic reactions (i.e.: anaphylactic or anaphylactoid reaction(s)); adrenal axis suppression; blood sugar elevation that in diabetics may result in ketoacidosis or comma; water retention that in patients with history of congestive heart failure may result in shortness of breath, pulmonary edema, and decompensation with resultant heart failure; weight gain; swelling or edema; medication-induced neural toxicity; particulate matter embolism and blood vessel occlusion with resultant organ, and/or nervous system infarction; and/or aseptic necrosis of one or more joints. Finally, the patient was informed that Medicine is not an exact science; therefore, there is also the possibility of unforeseen or unpredictable risks and/or possible complications that may result in a  catastrophic outcome. The patient indicated having understood very clearly. We have given the patient no guarantees and we have made no promises. Enough time was given to the patient to ask questions, all of which were answered to the patient's satisfaction. Ms. Leday has indicated that she wanted to continue with the procedure. Attestation: I, the ordering provider, attest that I have discussed with the patient the benefits, risks, side-effects, alternatives, likelihood of achieving goals, and potential problems during recovery for the procedure that I have provided informed consent. Date: 04/24/2017; Time: 7:59 AM  Pre-Procedure Preparation:  Monitoring: As per clinic protocol. Respiration, ETCO2, SpO2, BP, heart rate and rhythm monitor placed and checked for adequate function Safety Precautions: Patient was assessed for positional comfort and pressure points before starting the procedure. Time-out: I initiated and conducted the "Time-out" before starting the procedure, as per protocol. The patient was asked to participate by confirming the accuracy of the "Time Out" information. Verification of the correct person, site, and procedure were performed and confirmed by me, the nursing staff, and the patient. "Time-out" conducted as per Joint Commission's Universal Protocol (UP.01.01.01). "Time-out" Date & Time: 04/24/2017; 0844 hrs.  Description of Procedure Process:   Position: Prone Target Area: For Lumbar Facet blocks, the target is the groove formed by the junction of the transverse process and superior articular process. For the L5 dorsal ramus, the target is the notch between superior articular process and sacral ala. Approach: Paraspinal approach. Area Prepped: Entire Posterior Lumbosacral Region Prepping solution: Hibiclens (4.0% Chlorhexidine gluconate solution) Safety Precautions: Aspiration looking for blood return was conducted prior to all injections. At no point did we inject  any substances, as a needle was being advanced. No attempts were made at seeking any paresthesias. Safe injection practices and needle disposal techniques used. Medications properly checked for expiration dates. SDV (single dose vial) medications used. Description of the Procedure: Protocol guidelines were followed. The patient was placed in position over the procedure table. The target area was identified and the area prepped in the usual manner. The skin and muscle were infiltrated with local anesthetic. Appropriate amount of time allowed to pass for local anesthetics to take effect. Radiofrequency needles were introduced to the target area using fluoroscopic guidance.  Using the NeuroTherm NT1100 Radiofrequency Generator, sensory stimulation using 50 Hz was used to locate & identify the nerve, making sure that the needle was positioned such that there was no sensory stimulation below 0.3 V or above 0.7 V. Stimulation using 2 Hz was used to evaluate the motor component. Care was taken not to lesion any nerves that demonstrated motor stimulation of the lower extremities at an output of less than 2.5 times that of the sensory threshold, or a maximum of 2.0 V. Once satisfactory placement of the needles was achieved, the numbing solution was slowly injected after negative aspiration. After waiting for at least 2 minutes, the ablation was performed at 80 degrees C for 60 seconds, using regular Radiofrequency settings. Once the procedure was completed, the needles were then removed and the area cleansed, making sure to leave some of the prepping solution back to take advantage of its long term bactericidal properties. Intra-operative Compliance: Compliant  Illustration of the posterior view of the lumbar spine and the posterior neural structures. Laminae of L2 through S1 are labeled. DPRL5, dorsal primary ramus of L5; DPRS1, dorsal primary ramus of S1; DPR3, dorsal primary ramus of L3; FJ, facet (zygapophyseal) joint  L3-L4; I, inferior articular process of L4; LB1, lateral branch of dorsal primary ramus of L1; IAB, inferior articular branches from L3 medial branch (supplies L4-L5 facet joint); IBP, intermediate branch plexus; MB3, medial branch of dorsal primary ramus of L3; NR3, third lumbar nerve root; S, superior articular process of L5; SAB, superior articular branches from L4 (supplies L4-5 facet joint also); TP3, transverse process of L3.  Vitals:   04/24/17 0917 04/24/17 0929 04/24/17 0938 04/24/17 0948  BP: (!) 142/94 (!) 154/75 130/83 135/79  Pulse: (!) 57 (!) 58 (!) 55 (!) 58  Resp: 17 16 16 16   Temp: (!) 97.4 F (36.3 C)   98.9 F (37.2 C)  TempSrc:      SpO2: 100% 97% 100% 99%  Weight:      Height:        Start Time: 0845 hrs. End Time: 0916 hrs. Materials & Medications:  Needle(s) Type: Teflon-coated, curved tip, Radiofrequency needle(s) Gauge: 22G Length: 10cm Medication(s): We administered lactated ringers, fentaNYL, ropivacaine (PF) 2 mg/mL (0.2%), lidocaine (PF), and dexamethasone. Please see chart orders for dosing details. After ablation, each level was injected with 0.3 cc of Decadron 10 mg/cc at each level, flush with 0.2% ropivacaine and the needles were removed. Imaging Guidance (Spinal):  Type of Imaging Technique: Fluoroscopy Guidance (Spinal) Indication(s): Assistance in needle guidance and placement for procedures requiring needle placement in or near specific anatomical locations not easily accessible without such assistance. Exposure Time: Please see nurses notes. Contrast: None used. Fluoroscopic Guidance: I was personally present during the use of fluoroscopy. "Tunnel Vision Technique" used to obtain the best possible view of the target area. Parallax error corrected before commencing the procedure. "Direction-depth-direction" technique used to introduce the needle under continuous pulsed fluoroscopy. Once target was reached, antero-posterior, oblique, and lateral  fluoroscopic projection used confirm needle placement in all planes. Images permanently stored in EMR. Interpretation: No contrast injected. I personally interpreted the imaging intraoperatively. Adequate needle placement confirmed in multiple planes. Permanent images saved into the patient's record.  Antibiotic Prophylaxis:  Indication(s): None identified Antibiotic given: None  Post-operative Assessment:  EBL: None Complications: No immediate post-treatment complications observed by team, or reported by patient. Note: The patient tolerated the entire procedure well. A repeat set of vitals were taken after the procedure  and the patient was kept under observation following institutional policy, for this type of procedure. Post-procedural neurological assessment was performed, showing return to baseline, prior to discharge. The patient was provided with post-procedure discharge instructions, including a section on how to identify potential problems. Should any problems arise concerning this procedure, the patient was given instructions to immediately contact us, at any time, without hesitation. In any case, we plan to contact the patient by telephone for a follow-up status report regarding this interventional procedure. Comments:  No additional relevant information. 5 out of 5 strength bilateral lower extremity: Plantar flexion, dorsiflexion, knee flexion, knee extension. Plan of Care    Imaging Orders     DG C-Arm 1-60 Min-No Report Procedure Orders    No procedure(s) ordered today   Follow up in 4 weeks for post procedural eval  Medications ordered for procedure: Meds ordered this encounter  Medications  . lactated ringers infusion 1,000 mL  . fentaNYL (SUBLIMAZE) injection 25-100 mcg    Make sure Narcan is available in the pyxis when using this medication. In the event of respiratory depression (RR< 8/min): Titrate NARCAN (naloxone) in increments of 0.1 to 0.2 mg IV at 2-3 minute  intervals, until desired degree of reversal.  . ropivacaine (PF) 2 mg/mL (0.2%) (NAROPIN) injection 9 mL  . lidocaine (PF) (XYLOCAINE) 1 % injection 10 mL  . dexamethasone (DECADRON) injection 10 mg   Medications administered: We administered lactated ringers, fentaNYL, ropivacaine (PF) 2 mg/mL (0.2%), lidocaine (PF), and dexamethasone.  See the medical record for exact dosing, route, and time of administration.  Disposition: Discharge home  Discharge Date & Time: 04/24/2017; 0951 hrs.   Physician-requested Follow-up: Return in about 5 weeks (around 05/29/2017) for Post Procedure Evaluation. Future Appointments  Date Time Provider Department Center  05/30/2017 10:00 AM Edward Jolly, MD [facility] None  02/07/2018  1:00 PM [facility] 3 [facility] None  02/07/2018  2:00 PM Dew, Marlow Baars, MD [facility] None   Primary Care Physician: Leotis Shames, MD Location: [facility] Note by: Edward Jolly, MD Date: 04/24/2017; Time: 10:16 AM  Disclaimer:  Medicine is not an exact science. The only guarantee in medicine is that nothing is guaranteed. It is important to note that the decision to proceed with this intervention was based on the information collected from the patient. The Data and conclusions were drawn from the patient's questionnaire, the interview, and the physical examination. Because the information was provided in large part by the patient, it cannot be guaranteed that it has not been purposely or unconsciously manipulated. Every effort has been made to obtain as much relevant data as possible for this evaluation. It is important to note that the conclusions that lead to this procedure are derived in large part from the available data. Always take into account that the treatment will also be dependent on availability of resources and existing treatment guidelines, considered by other Pain Management Practitioners as being common knowledge and practice, at the time of  the intervention. For Medico-Legal purposes, it is also important to point out that variation in procedural techniques and pharmacological choices are the acceptable norm. The indications, contraindications, technique, and results of the above procedure should only be interpreted and judged by a Board-Certified Interventional Pain Specialist with extensive familiarity and expertise in the same exact procedure and technique.

## 2017-04-24 NOTE — Progress Notes (Signed)
Safety precautions to be maintained throughout the outpatient stay will include: orient to surroundings, keep bed in low position, maintain call bell within reach at all times, provide assistance with transfer out of bed and ambulation.  

## 2017-04-25 ENCOUNTER — Telehealth: Payer: Self-pay

## 2017-04-25 NOTE — Telephone Encounter (Signed)
Post procedure phone call.  States she is doing ok.   

## 2017-05-30 ENCOUNTER — Encounter: Payer: Self-pay | Admitting: Student in an Organized Health Care Education/Training Program

## 2017-05-30 ENCOUNTER — Ambulatory Visit
Payer: Managed Care, Other (non HMO) | Attending: Student in an Organized Health Care Education/Training Program | Admitting: Student in an Organized Health Care Education/Training Program

## 2017-05-30 ENCOUNTER — Other Ambulatory Visit: Payer: Self-pay

## 2017-05-30 VITALS — BP 146/87 | HR 55 | Temp 98.1°F | Resp 18 | Ht 61.0 in | Wt 223.0 lb

## 2017-05-30 DIAGNOSIS — K5732 Diverticulitis of large intestine without perforation or abscess without bleeding: Secondary | ICD-10-CM | POA: Insufficient documentation

## 2017-05-30 DIAGNOSIS — E785 Hyperlipidemia, unspecified: Secondary | ICD-10-CM | POA: Insufficient documentation

## 2017-05-30 DIAGNOSIS — M5136 Other intervertebral disc degeneration, lumbar region: Secondary | ICD-10-CM | POA: Insufficient documentation

## 2017-05-30 DIAGNOSIS — K449 Diaphragmatic hernia without obstruction or gangrene: Secondary | ICD-10-CM | POA: Insufficient documentation

## 2017-05-30 DIAGNOSIS — E876 Hypokalemia: Secondary | ICD-10-CM | POA: Insufficient documentation

## 2017-05-30 DIAGNOSIS — Z86010 Personal history of colonic polyps: Secondary | ICD-10-CM | POA: Diagnosis not present

## 2017-05-30 DIAGNOSIS — K219 Gastro-esophageal reflux disease without esophagitis: Secondary | ICD-10-CM | POA: Insufficient documentation

## 2017-05-30 DIAGNOSIS — M4306 Spondylolysis, lumbar region: Secondary | ICD-10-CM

## 2017-05-30 DIAGNOSIS — G8929 Other chronic pain: Secondary | ICD-10-CM | POA: Diagnosis not present

## 2017-05-30 DIAGNOSIS — M47816 Spondylosis without myelopathy or radiculopathy, lumbar region: Secondary | ICD-10-CM

## 2017-05-30 DIAGNOSIS — G43909 Migraine, unspecified, not intractable, without status migrainosus: Secondary | ICD-10-CM | POA: Insufficient documentation

## 2017-05-30 DIAGNOSIS — Z7982 Long term (current) use of aspirin: Secondary | ICD-10-CM | POA: Diagnosis not present

## 2017-05-30 DIAGNOSIS — M51369 Other intervertebral disc degeneration, lumbar region without mention of lumbar back pain or lower extremity pain: Secondary | ICD-10-CM

## 2017-05-30 DIAGNOSIS — Z79899 Other long term (current) drug therapy: Secondary | ICD-10-CM | POA: Diagnosis not present

## 2017-05-30 DIAGNOSIS — I10 Essential (primary) hypertension: Secondary | ICD-10-CM | POA: Diagnosis not present

## 2017-05-30 DIAGNOSIS — M545 Low back pain: Secondary | ICD-10-CM | POA: Diagnosis present

## 2017-05-30 NOTE — Progress Notes (Signed)
Patient's Name: Terri Werner  MRN: [id number]  Referring Provider: Singh, Jasmine, MD  DOB: 03/11/1951  PCP: Singh, Jasmine, MD  DOS: 05/30/2017  Note by: Bilal Lateef, MD  Service setting: Ambulatory outpatient  Specialty: Interventional Pain Management  Location: [facility] (AMB) Pain Management Facility    Patient type: Established   Primary Reason(s) for Visit: Encounter for post-procedure evaluation of chronic illness with mild to moderate exacerbation CC: Back Pain (left, lower)  HPI  Terri Werner is a 66 y.o. year old, female patient, who comes today for a post-procedure evaluation. She has HYPERTENSION, BENIGN; Screening for breast cancer; Arthralgia; Routine general medical examination at a health care facility; Hyperlipidemia; Carotid artery disorder (HCC); Diverticulitis of colon; and Lumbago on their problem list. Her primarily concern today is the Back Pain (left, lower)  Pain Assessment: Location: Left, Lower Back Radiating: both hips, left upper leg Onset: More than a month ago Duration: Chronic pain Quality: Nagging Severity: 2 /10 (self-reported pain score)  Note: Reported level is compatible with observation.                         When using our objective Pain Scale, levels between 6 and 10/10 are said to belong in an emergency room, as it progressively worsens from a 6/10, described as severely limiting, requiring emergency care not usually available at an outpatient pain management facility. At a 6/10 level, communication becomes difficult and requires great effort. Assistance to reach the emergency department may be required. Facial flushing and profuse sweating along with potentially dangerous increases in heart rate and blood pressure will be evident. Effect on ADL:   Timing: Intermittent Modifying factors: TENS, Voltaren gel  Terri Werner comes in today for post-procedure evaluation after the treatment done on 04/24/2017.  Further details on  both, my assessment(s), as well as the proposed treatment plan, please see below.  Post-Procedure Assessment  04/24/2017 Procedure: R L3-L5 RFA Pre-procedure pain score:  2/10 Post-procedure pain score: 3/10         Influential Factors: BMI: 42.14 kg/m Intra-procedural challenges: None observed.         Assessment challenges: None detected.              Reported side-effects: None.        Post-procedural adverse reactions or complications: None reported         Sedation: Please see nurses note. When no sedatives are used, the analgesic levels obtained are directly associated to the effectiveness of the local anesthetics. However, when sedation is provided, the level of analgesia obtained during the initial 1 hour following the intervention, is believed to be the result of a combination of factors. These factors may include, but are not limited to: 1. The effectiveness of the local anesthetics used. 2. The effects of the analgesic(s) and/or anxiolytic(s) used. 3. The degree of discomfort experienced by the patient at the time of the procedure. 4. The patients ability and reliability in recalling and recording the events. 5. The presence and influence of possible secondary gains and/or psychosocial factors. Reported result: Relief experienced during the 1st hour after the procedure: 100 % (Ultra-Short Term Relief)            Interpretative annotation: Clinically appropriate result. Analgesia during this period is likely to be Local Anesthetic and/or IV Sedative (Analgesic/Anxiolytic) related.          Effects of local anesthetic: The analgesic effects attained during this period are directly associated   Patient's Name: Terri Werner  MRN: [id number]  Referring Provider: Singh, Jasmine, MD  DOB: 06/03/1950  PCP: Singh, Jasmine, MD  DOS: 05/30/2017  Note by: Labarron Durnin, MD  Service setting: Ambulatory outpatient  Specialty: Interventional Pain Management  Location: [facility] (AMB) Pain Management Facility    Patient type: Established   Primary Reason(s) for Visit: Encounter for post-procedure evaluation of chronic illness with mild to moderate exacerbation CC: Back Pain (left, lower)  HPI  Terri Werner is a 66 y.o. year old, female patient, who comes today for a post-procedure evaluation. She has HYPERTENSION, BENIGN; Screening for breast cancer; Arthralgia; Routine general medical examination at a health care facility; Hyperlipidemia; Carotid artery disorder (HCC); Diverticulitis of colon; and Lumbago on their problem list. Her primarily concern today is the Back Pain (left, lower)  Pain Assessment: Location: Left, Lower Back Radiating: both hips, left upper leg Onset: More than a month ago Duration: Chronic pain Quality: Nagging Severity: 2 /10 (self-reported pain score)  Note: Reported level is compatible with observation.                         When using our objective Pain Scale, levels between 6 and 10/10 are said to belong in an emergency room, as it progressively worsens from a 6/10, described as severely limiting, requiring emergency care not usually available at an outpatient pain management facility. At a 6/10 level, communication becomes difficult and requires great effort. Assistance to reach the emergency department may be required. Facial flushing and profuse sweating along with potentially dangerous increases in heart rate and blood pressure will be evident. Effect on ADL:   Timing: Intermittent Modifying factors: TENS, Voltaren gel  Terri Werner comes in today for post-procedure evaluation after the treatment done on 04/24/2017.  Further details on  both, my assessment(s), as well as the proposed treatment plan, please see below.  Post-Procedure Assessment  04/24/2017 Procedure: R L3-L5 RFA Pre-procedure pain score:  2/10 Post-procedure pain score: 3/10         Influential Factors: BMI: 42.14 kg/m Intra-procedural challenges: None observed.         Assessment challenges: None detected.              Reported side-effects: None.        Post-procedural adverse reactions or complications: None reported         Sedation: Please see nurses note. When no sedatives are used, the analgesic levels obtained are directly associated to the effectiveness of the local anesthetics. However, when sedation is provided, the level of analgesia obtained during the initial 1 hour following the intervention, is believed to be the result of a combination of factors. These factors may include, but are not limited to: 1. The effectiveness of the local anesthetics used. 2. The effects of the analgesic(s) and/or anxiolytic(s) used. 3. The degree of discomfort experienced by the patient at the time of the procedure. 4. The patients ability and reliability in recalling and recording the events. 5. The presence and influence of possible secondary gains and/or psychosocial factors. Reported result: Relief experienced during the 1st hour after the procedure: 100 % (Ultra-Short Term Relief)            Interpretative annotation: Clinically appropriate result. Analgesia during this period is likely to be Local Anesthetic and/or IV Sedative (Analgesic/Anxiolytic) related.          Effects of local anesthetic: The analgesic effects attained during this period are directly associated   Patient's Name: Terri Werner  MRN: [id number]  Referring Provider: Singh, Jasmine, MD  DOB: 03/11/1951  PCP: Singh, Jasmine, MD  DOS: 05/30/2017  Note by: Graycee Greeson, MD  Service setting: Ambulatory outpatient  Specialty: Interventional Pain Management  Location: [facility] (AMB) Pain Management Facility    Patient type: Established   Primary Reason(s) for Visit: Encounter for post-procedure evaluation of chronic illness with mild to moderate exacerbation CC: Back Pain (left, lower)  HPI  Terri Werner is a 66 y.o. year old, female patient, who comes today for a post-procedure evaluation. She has HYPERTENSION, BENIGN; Screening for breast cancer; Arthralgia; Routine general medical examination at a health care facility; Hyperlipidemia; Carotid artery disorder (HCC); Diverticulitis of colon; and Lumbago on their problem list. Her primarily concern today is the Back Pain (left, lower)  Pain Assessment: Location: Left, Lower Back Radiating: both hips, left upper leg Onset: More than a month ago Duration: Chronic pain Quality: Nagging Severity: 2 /10 (self-reported pain score)  Note: Reported level is compatible with observation.                         When using our objective Pain Scale, levels between 6 and 10/10 are said to belong in an emergency room, as it progressively worsens from a 6/10, described as severely limiting, requiring emergency care not usually available at an outpatient pain management facility. At a 6/10 level, communication becomes difficult and requires great effort. Assistance to reach the emergency department may be required. Facial flushing and profuse sweating along with potentially dangerous increases in heart rate and blood pressure will be evident. Effect on ADL:   Timing: Intermittent Modifying factors: TENS, Voltaren gel  Terri Werner comes in today for post-procedure evaluation after the treatment done on 04/24/2017.  Further details on  both, my assessment(s), as well as the proposed treatment plan, please see below.  Post-Procedure Assessment  04/24/2017 Procedure: R L3-L5 RFA Pre-procedure pain score:  2/10 Post-procedure pain score: 3/10         Influential Factors: BMI: 42.14 kg/m Intra-procedural challenges: None observed.         Assessment challenges: None detected.              Reported side-effects: None.        Post-procedural adverse reactions or complications: None reported         Sedation: Please see nurses note. When no sedatives are used, the analgesic levels obtained are directly associated to the effectiveness of the local anesthetics. However, when sedation is provided, the level of analgesia obtained during the initial 1 hour following the intervention, is believed to be the result of a combination of factors. These factors may include, but are not limited to: 1. The effectiveness of the local anesthetics used. 2. The effects of the analgesic(s) and/or anxiolytic(s) used. 3. The degree of discomfort experienced by the patient at the time of the procedure. 4. The patients ability and reliability in recalling and recording the events. 5. The presence and influence of possible secondary gains and/or psychosocial factors. Reported result: Relief experienced during the 1st hour after the procedure: 100 % (Ultra-Short Term Relief)            Interpretative annotation: Clinically appropriate result. Analgesia during this period is likely to be Local Anesthetic and/or IV Sedative (Analgesic/Anxiolytic) related.          Effects of local anesthetic: The analgesic effects attained during this period are directly associated   Patient's Name: Terri Werner  MRN: [id number]  Referring Provider: Singh, Jasmine, MD  DOB: 06/03/1950  PCP: Singh, Jasmine, MD  DOS: 05/30/2017  Note by: Bilal Lateef, MD  Service setting: Ambulatory outpatient  Specialty: Interventional Pain Management  Location: [facility] (AMB) Pain Management Facility    Patient type: Established   Primary Reason(s) for Visit: Encounter for post-procedure evaluation of chronic illness with mild to moderate exacerbation CC: Back Pain (left, lower)  HPI  Terri Werner is a 66 y.o. year old, female patient, who comes today for a post-procedure evaluation. She has HYPERTENSION, BENIGN; Screening for breast cancer; Arthralgia; Routine general medical examination at a health care facility; Hyperlipidemia; Carotid artery disorder (HCC); Diverticulitis of colon; and Lumbago on their problem list. Her primarily concern today is the Back Pain (left, lower)  Pain Assessment: Location: Left, Lower Back Radiating: both hips, left upper leg Onset: More than a month ago Duration: Chronic pain Quality: Nagging Severity: 2 /10 (self-reported pain score)  Note: Reported level is compatible with observation.                         When using our objective Pain Scale, levels between 6 and 10/10 are said to belong in an emergency room, as it progressively worsens from a 6/10, described as severely limiting, requiring emergency care not usually available at an outpatient pain management facility. At a 6/10 level, communication becomes difficult and requires great effort. Assistance to reach the emergency department may be required. Facial flushing and profuse sweating along with potentially dangerous increases in heart rate and blood pressure will be evident. Effect on ADL:   Timing: Intermittent Modifying factors: TENS, Voltaren gel  Terri Werner comes in today for post-procedure evaluation after the treatment done on 04/24/2017.  Further details on  both, my assessment(s), as well as the proposed treatment plan, please see below.  Post-Procedure Assessment  04/24/2017 Procedure: R L3-L5 RFA Pre-procedure pain score:  2/10 Post-procedure pain score: 3/10         Influential Factors: BMI: 42.14 kg/m Intra-procedural challenges: None observed.         Assessment challenges: None detected.              Reported side-effects: None.        Post-procedural adverse reactions or complications: None reported         Sedation: Please see nurses note. When no sedatives are used, the analgesic levels obtained are directly associated to the effectiveness of the local anesthetics. However, when sedation is provided, the level of analgesia obtained during the initial 1 hour following the intervention, is believed to be the result of a combination of factors. These factors may include, but are not limited to: 1. The effectiveness of the local anesthetics used. 2. The effects of the analgesic(s) and/or anxiolytic(s) used. 3. The degree of discomfort experienced by the patient at the time of the procedure. 4. The patients ability and reliability in recalling and recording the events. 5. The presence and influence of possible secondary gains and/or psychosocial factors. Reported result: Relief experienced during the 1st hour after the procedure: 100 % (Ultra-Short Term Relief)            Interpretative annotation: Clinically appropriate result. Analgesia during this period is likely to be Local Anesthetic and/or IV Sedative (Analgesic/Anxiolytic) related.          Effects of local anesthetic: The analgesic effects attained during this period are directly associated   Patient's Name: Terri Werner  MRN: [id number]  Referring Provider: Singh, Jasmine, MD  DOB: 06/03/1950  PCP: Singh, Jasmine, MD  DOS: 05/30/2017  Note by: Bilal Lateef, MD  Service setting: Ambulatory outpatient  Specialty: Interventional Pain Management  Location: [facility] (AMB) Pain Management Facility    Patient type: Established   Primary Reason(s) for Visit: Encounter for post-procedure evaluation of chronic illness with mild to moderate exacerbation CC: Back Pain (left, lower)  HPI  Terri Werner is a 66 y.o. year old, female patient, who comes today for a post-procedure evaluation. She has HYPERTENSION, BENIGN; Screening for breast cancer; Arthralgia; Routine general medical examination at a health care facility; Hyperlipidemia; Carotid artery disorder (HCC); Diverticulitis of colon; and Lumbago on their problem list. Her primarily concern today is the Back Pain (left, lower)  Pain Assessment: Location: Left, Lower Back Radiating: both hips, left upper leg Onset: More than a month ago Duration: Chronic pain Quality: Nagging Severity: 2 /10 (self-reported pain score)  Note: Reported level is compatible with observation.                         When using our objective Pain Scale, levels between 6 and 10/10 are said to belong in an emergency room, as it progressively worsens from a 6/10, described as severely limiting, requiring emergency care not usually available at an outpatient pain management facility. At a 6/10 level, communication becomes difficult and requires great effort. Assistance to reach the emergency department may be required. Facial flushing and profuse sweating along with potentially dangerous increases in heart rate and blood pressure will be evident. Effect on ADL:   Timing: Intermittent Modifying factors: TENS, Voltaren gel  Terri Werner comes in today for post-procedure evaluation after the treatment done on 04/24/2017.  Further details on  both, my assessment(s), as well as the proposed treatment plan, please see below.  Post-Procedure Assessment  04/24/2017 Procedure: R L3-L5 RFA Pre-procedure pain score:  2/10 Post-procedure pain score: 3/10         Influential Factors: BMI: 42.14 kg/m Intra-procedural challenges: None observed.         Assessment challenges: None detected.              Reported side-effects: None.        Post-procedural adverse reactions or complications: None reported         Sedation: Please see nurses note. When no sedatives are used, the analgesic levels obtained are directly associated to the effectiveness of the local anesthetics. However, when sedation is provided, the level of analgesia obtained during the initial 1 hour following the intervention, is believed to be the result of a combination of factors. These factors may include, but are not limited to: 1. The effectiveness of the local anesthetics used. 2. The effects of the analgesic(s) and/or anxiolytic(s) used. 3. The degree of discomfort experienced by the patient at the time of the procedure. 4. The patients ability and reliability in recalling and recording the events. 5. The presence and influence of possible secondary gains and/or psychosocial factors. Reported result: Relief experienced during the 1st hour after the procedure: 100 % (Ultra-Short Term Relief)            Interpretative annotation: Clinically appropriate result. Analgesia during this period is likely to be Local Anesthetic and/or IV Sedative (Analgesic/Anxiolytic) related.          Effects of local anesthetic: The analgesic effects attained during this period are directly associated   Patient's Name: Terri Werner  MRN: [id number]  Referring Provider: Singh, Jasmine, MD  DOB: 03/11/1951  PCP: Singh, Jasmine, MD  DOS: 05/30/2017  Note by: Jacaria Colburn, MD  Service setting: Ambulatory outpatient  Specialty: Interventional Pain Management  Location: [facility] (AMB) Pain Management Facility    Patient type: Established   Primary Reason(s) for Visit: Encounter for post-procedure evaluation of chronic illness with mild to moderate exacerbation CC: Back Pain (left, lower)  HPI  Terri Werner is a 66 y.o. year old, female patient, who comes today for a post-procedure evaluation. She has HYPERTENSION, BENIGN; Screening for breast cancer; Arthralgia; Routine general medical examination at a health care facility; Hyperlipidemia; Carotid artery disorder (HCC); Diverticulitis of colon; and Lumbago on their problem list. Her primarily concern today is the Back Pain (left, lower)  Pain Assessment: Location: Left, Lower Back Radiating: both hips, left upper leg Onset: More than a month ago Duration: Chronic pain Quality: Nagging Severity: 2 /10 (self-reported pain score)  Note: Reported level is compatible with observation.                         When using our objective Pain Scale, levels between 6 and 10/10 are said to belong in an emergency room, as it progressively worsens from a 6/10, described as severely limiting, requiring emergency care not usually available at an outpatient pain management facility. At a 6/10 level, communication becomes difficult and requires great effort. Assistance to reach the emergency department may be required. Facial flushing and profuse sweating along with potentially dangerous increases in heart rate and blood pressure will be evident. Effect on ADL:   Timing: Intermittent Modifying factors: TENS, Voltaren gel  Terri Werner comes in today for post-procedure evaluation after the treatment done on 04/24/2017.  Further details on  both, my assessment(s), as well as the proposed treatment plan, please see below.  Post-Procedure Assessment  04/24/2017 Procedure: R L3-L5 RFA Pre-procedure pain score:  2/10 Post-procedure pain score: 3/10         Influential Factors: BMI: 42.14 kg/m Intra-procedural challenges: None observed.         Assessment challenges: None detected.              Reported side-effects: None.        Post-procedural adverse reactions or complications: None reported         Sedation: Please see nurses note. When no sedatives are used, the analgesic levels obtained are directly associated to the effectiveness of the local anesthetics. However, when sedation is provided, the level of analgesia obtained during the initial 1 hour following the intervention, is believed to be the result of a combination of factors. These factors may include, but are not limited to: 1. The effectiveness of the local anesthetics used. 2. The effects of the analgesic(s) and/or anxiolytic(s) used. 3. The degree of discomfort experienced by the patient at the time of the procedure. 4. The patients ability and reliability in recalling and recording the events. 5. The presence and influence of possible secondary gains and/or psychosocial factors. Reported result: Relief experienced during the 1st hour after the procedure: 100 % (Ultra-Short Term Relief)            Interpretative annotation: Clinically appropriate result. Analgesia during this period is likely to be Local Anesthetic and/or IV Sedative (Analgesic/Anxiolytic) related.          Effects of local anesthetic: The analgesic effects attained during this period are directly associated

## 2017-05-30 NOTE — Progress Notes (Signed)
Safety precautions to be maintained throughout the outpatient stay will include: orient to surroundings, keep bed in low position, maintain call bell within reach at all times, provide assistance with transfer out of bed and ambulation.  

## 2017-07-30 DIAGNOSIS — K5909 Other constipation: Secondary | ICD-10-CM | POA: Insufficient documentation

## 2017-08-20 ENCOUNTER — Encounter: Payer: Self-pay | Admitting: Student in an Organized Health Care Education/Training Program

## 2017-08-20 ENCOUNTER — Ambulatory Visit
Payer: Managed Care, Other (non HMO) | Attending: Student in an Organized Health Care Education/Training Program | Admitting: Student in an Organized Health Care Education/Training Program

## 2017-08-20 ENCOUNTER — Other Ambulatory Visit: Payer: Self-pay

## 2017-08-20 VITALS — BP 143/87 | HR 67 | Temp 98.2°F | Resp 18 | Ht 62.0 in | Wt 215.0 lb

## 2017-08-20 DIAGNOSIS — M5440 Lumbago with sciatica, unspecified side: Secondary | ICD-10-CM | POA: Insufficient documentation

## 2017-08-20 DIAGNOSIS — M4306 Spondylolysis, lumbar region: Secondary | ICD-10-CM

## 2017-08-20 DIAGNOSIS — E876 Hypokalemia: Secondary | ICD-10-CM | POA: Insufficient documentation

## 2017-08-20 DIAGNOSIS — M47816 Spondylosis without myelopathy or radiculopathy, lumbar region: Secondary | ICD-10-CM | POA: Diagnosis not present

## 2017-08-20 DIAGNOSIS — M47812 Spondylosis without myelopathy or radiculopathy, cervical region: Secondary | ICD-10-CM | POA: Insufficient documentation

## 2017-08-20 DIAGNOSIS — Z86010 Personal history of colonic polyps: Secondary | ICD-10-CM | POA: Diagnosis not present

## 2017-08-20 DIAGNOSIS — Z888 Allergy status to other drugs, medicaments and biological substances status: Secondary | ICD-10-CM | POA: Diagnosis not present

## 2017-08-20 DIAGNOSIS — I10 Essential (primary) hypertension: Secondary | ICD-10-CM | POA: Insufficient documentation

## 2017-08-20 DIAGNOSIS — M25562 Pain in left knee: Secondary | ICD-10-CM | POA: Insufficient documentation

## 2017-08-20 DIAGNOSIS — G8929 Other chronic pain: Secondary | ICD-10-CM | POA: Diagnosis not present

## 2017-08-20 DIAGNOSIS — K219 Gastro-esophageal reflux disease without esophagitis: Secondary | ICD-10-CM | POA: Insufficient documentation

## 2017-08-20 DIAGNOSIS — K5732 Diverticulitis of large intestine without perforation or abscess without bleeding: Secondary | ICD-10-CM | POA: Insufficient documentation

## 2017-08-20 DIAGNOSIS — Z79899 Other long term (current) drug therapy: Secondary | ICD-10-CM | POA: Diagnosis not present

## 2017-08-20 DIAGNOSIS — E785 Hyperlipidemia, unspecified: Secondary | ICD-10-CM | POA: Diagnosis not present

## 2017-08-20 DIAGNOSIS — M5136 Other intervertebral disc degeneration, lumbar region: Secondary | ICD-10-CM | POA: Diagnosis not present

## 2017-08-20 DIAGNOSIS — G894 Chronic pain syndrome: Secondary | ICD-10-CM | POA: Diagnosis not present

## 2017-08-20 DIAGNOSIS — K449 Diaphragmatic hernia without obstruction or gangrene: Secondary | ICD-10-CM | POA: Diagnosis not present

## 2017-08-20 DIAGNOSIS — Z7982 Long term (current) use of aspirin: Secondary | ICD-10-CM | POA: Insufficient documentation

## 2017-08-20 DIAGNOSIS — M545 Low back pain: Secondary | ICD-10-CM

## 2017-08-20 DIAGNOSIS — M51369 Other intervertebral disc degeneration, lumbar region without mention of lumbar back pain or lower extremity pain: Secondary | ICD-10-CM

## 2017-08-20 NOTE — Progress Notes (Signed)
Patient's Name: Terri Werner  MRN: [id number]  Referring Provider: Leotis Shames, MD  DOB: Sep 18, 1950  PCP: Leotis Shames, MD  DOS: 08/20/2017  Note by: Edward Jolly, MD  Service setting: Ambulatory outpatient  Specialty: Interventional Pain Management  Location: [facility] (AMB) Pain Management Facility    Patient type: Established   Primary Reason(s) for Visit: Evaluation of chronic illnesses with exacerbation, or progression (Level of risk: moderate) CC: Back Pain (low)  HPI  Terri Werner is a 67 y.o. year old, female patient, who comes today for a follow-up evaluation. She has HYPERTENSION, BENIGN; Screening for breast cancer; Arthralgia; Routine general medical examination at a health care facility; Hyperlipidemia; Carotid artery disorder (HCC); Diverticulitis of colon; Lumbago; Lumbar spondylolysis; Lumbar facet arthropathy; and Lumbar degenerative disc disease on their problem list. Ms. Pernell was last seen on 05/30/2017. Her primarily concern today is the Back Pain (low)  Pain Assessment: Location: Lower Back Radiating: sometimes radiates to both hips Onset: More than a month ago Duration:  in the evenings for a couple of hours Quality: Stabbing Severity: 3 /10 (self-reported pain score)  Note: Reported level is compatible with observation.                         When using our objective Pain Scale, levels between 6 and 10/10 are said to belong in an emergency room, as it progressively worsens from a 6/10, described as severely limiting, requiring emergency care not usually available at an outpatient pain management facility. At a 6/10 level, communication becomes difficult and requires great effort. Assistance to reach the emergency department may be required. Facial flushing and profuse sweating along with potentially dangerous increases in heart rate and blood pressure will be evident. Effect on ADL:   Timing: Intermittent Modifying factors: tens  Further  details on both, my assessment(s), as well as the proposed treatment plan, please see below.  Laboratory Chemistry  Inflammation Markers (CRP: Acute Phase) (ESR: Chronic Phase) No results found for: CRP, ESRSEDRATE, LATICACIDVEN                       Rheumatology Markers No results found for: RF, ANA, Loni Beckwith, [facility]                      Renal Function Markers Lab Results  Component Value Date   BUN 15 07/05/2014   CREATININE 0.89 07/05/2014   GFRAA >60 07/03/2012   GFRNONAA >60 07/03/2012                              Hepatic Function Markers Lab Results  Component Value Date   AST 25 07/05/2014   ALT 24 07/05/2014   ALBUMIN 3.8 07/05/2014   ALKPHOS 52 07/05/2014   HCVAB NEGATIVE 07/06/2015                        Electrolytes Lab Results  Component Value Date   NA 142 07/05/2014   K 4.4 07/05/2014   CL 106 07/05/2014   CALCIUM 9.4 07/05/2014                        Neuropathy Markers Lab Results  Component Value Date   HGBA1C 6.6 (H) 04/15/2014  Bone Pathology Markers Lab Results  Component Value Date   VD25OH 28.98 (L) 04/15/2014                         Coagulation Parameters Lab Results  Component Value Date   PLT 315.0 07/05/2014                        Cardiovascular Markers Lab Results  Component Value Date   TROPONINI < 0.02 07/03/2012   HGB 12.3 07/05/2014   HCT 36.9 07/05/2014                         CA Markers No results found for: CEA, CA125, LABCA2                      Note: Lab results reviewed.  Recent Diagnostic Imaging Review  Cervical Imaging: Cervical MR wo contrast:  Results for orders placed during the hospital encounter of 02/07/16  MR Cervical Spine Wo Contrast   Narrative CLINICAL DATA:  67 year old female with neck stiffness, hand swelling. Abnormal reflexes. Initial encounter.  EXAM: MRI CERVICAL SPINE WITHOUT CONTRAST  TECHNIQUE: Multiplanar, multisequence MR  imaging of the cervical spine was performed. No intravenous contrast was administered.  COMPARISON:  Cervical spine radiographs 05/20/2014. Neck CT 02/05/2008.  FINDINGS: Alignment: Stable cervical lordosis.  Vertebrae: No marrow edema or evidence of acute osseous abnormality.  Cord: Spinal cord signal is within normal limits at all visualized levels.  Posterior Fossa, vertebral arteries, paraspinal tissues: Cervicomedullary junction is within normal limits. Negative visualized posterior [place] negative neck soft tissues. Preserved major vascular flow voids.  Disc levels:  C2-C3:  Mild uncovertebral hypertrophy.  No stenosis.  C3-C4: Mild facet and uncovertebral hypertrophy greater on the left. Mild left greater than right C4 foraminal stenosis.  C4-C5: Moderate to severe left facet hypertrophy. Mild right facet hypertrophy. Mild uncovertebral hypertrophy. Mild ligament flavum hypertrophy. No spinal stenosis. Moderate left C5 foraminal stenosis.  C5-C6: Circumferential disc bulge. Superimposed small central disc protrusion (series 6, image 17). Moderate facet hypertrophy greater on the left. Mild ligament flavum hypertrophy. Borderline to mild spinal stenosis. No significant foraminal stenosis.  C6-C7: Trace anterolisthesis. Moderate facet hypertrophy on the left. Mild ligament flavum hypertrophy. Mild left C7 foraminal stenosis.  C7-T1:  Negative.  No upper thoracic spinal stenosis.  IMPRESSION: 1.  No acute osseous abnormality. 2. Dominant cervical degenerative finding is facet arthropathy, which in general is greater on the left. This contributes to up to moderate neural foraminal stenosis at the left C5 nerve level. 3. Comparatively mild cervical disc degeneration, most pronounced at C5-C6. Mild spinal stenosis at that level with no spinal cord mass effect.   Electronically Signed   By: Odessa Fleming M.D.   On: 02/07/2016 14:50    Cervical DG complete:   Results for orders placed during the hospital encounter of 05/20/14  DG Cervical Spine Complete   Narrative CLINICAL DATA:  Right-sided neck and shoulder pain for 3 months, initial encounter, no known injury  EXAM: CERVICAL SPINE  4+ VIEWS  COMPARISON:  None.  FINDINGS: Seven cervical segments are well visualized. Vertebral body height is well maintained. Mild osteophytic changes and facet hypertrophic changes are seen. The odontoid is within normal limits. Very mild neural foraminal narrowing is noted on the left at C4-5 and to a lesser degree at C6-7.  IMPRESSION: Mild degenerative change without acute  abnormality.   Electronically Signed   By: Alcide Clever M.D.   On: 05/20/2014 11:25     Shoulder-R DG:  Results for orders placed during the hospital encounter of 05/20/14  DG Shoulder Right   Narrative CLINICAL DATA:  Right shoulder pain for 3 months, no known injury, initial encounter  EXAM: RIGHT SHOULDER - 2+ VIEW  COMPARISON:  None.  FINDINGS: There is no evidence of fracture or dislocation. There is no evidence of arthropathy or other focal bone abnormality. Soft tissues are unremarkable.  IMPRESSION: No acute abnormality noted.   Electronically Signed   By: Alcide Clever M.D.   On: 05/20/2014 11:26     Lumbosacral Imaging: Lumbar MR wo contrast:  Results for orders placed during the hospital encounter of 09/07/15  MR Lumbar Spine Wo Contrast   Narrative CLINICAL DATA:  Low back pain for 1.5 years but worsening over the past 6 weeks, radiating down the right leg.  EXAM: MRI LUMBAR SPINE WITHOUT CONTRAST  TECHNIQUE: Multiplanar, multisequence MR imaging of the lumbar spine was performed. No intravenous contrast was administered.  COMPARISON:  08/04/2015  FINDINGS: Review of prior chest and lumbar radiographs reveals 12 pairs of ribs and 6 lumbar type non-rib-bearing vertebra, the lowest of which is partially segmental. Accordingly, the  transitional lumbosacral vertebra is assumed to represent the S1 level. Careful correlation with this numbering strategy prior to any procedural intervention would be recommended.  Please note that the axial images arm is registered with the sagittal T2 and T1 weighted images but are registered reasonably well with the sagittal STIR images. In order to prevent confusion, I went ahead and labeled the axial T2 intervertebral disc levels.  Multilevel degenerative subluxations include 3 mm retrolisthesis at T11-12, L1- 2, and L2- 3; 3 mm degenerative anterolisthesis at L3-4; 6 mm anterolisthesis at L4-5; and 3 mm anterolisthesis at L5-S 1. Intervertebral disc desiccation is observed at all levels in the lumbar spine.  Degenerative facet arthropathy with associated edema noted at the L5-S1 level bilaterally, with bilateral facet joint effusions.  Mildly exaggerated lumbar lordosis. Additional findings at individual levels are as follows:  Disc bulges at T10-11 and T11-12 without impingement. Additional findings at individual levels are as follows:  L1-2:  No impingement.  Mild disc bulge.  L2-3: No impingement. Disc bulge and mild bilateral facet arthropathy.  L3-4: Mild right and borderline left subarticular lateral recess stenosis, borderline bilateral foraminal stenosis, and mild displacement of the right L3 nerve in the lateral extraforaminal space due to disc bulge, right lateral extraforaminal disc protrusion, and left greater than right facet arthropathy. Borderline central narrowing of the thecal sac.  L4-5: Mild central narrowing of the thecal sac with mild bilateral subarticular lateral recess stenosis as well as mild bilateral foraminal stenosis due to disc bulge and facet arthropathy. There is mild displacement of both L4 nerves in the lateral extraforaminal space.  L5-S1: Moderate left and mild right foraminal stenosis with mild bilateral subarticular lateral  recess stenosis and moderate central narrowing of the thecal sac due to disc bulge, intervertebral spurring, and facet spurring. Bilateral facet joint effusions.  S1-2:  No impingement.  IMPRESSION: 1. Transitional lumbosacral S1 vertebra. 2. Lumbar spondylosis and degenerative disc disease causing moderate impingement at L5-S1 and mild impingement at L3-4 and L4-5, as detailed above. 3. Please take special note that the axial images are mis-registered with the sagittal T2 and T1 weighted images. I went ahead and labeled the axial T2 weighted images at each  disc space in order to clarify the actual levels.   Electronically Signed   By: Gaylyn Rong M.D.   On: 09/07/2015 09:29    Results for orders placed during the hospital encounter of 08/04/15  DG Lumbar Spine Complete   Narrative CLINICAL DATA:  Chronic low back pain worse over the past month. No injury. Pain radiates to right lower extremity.  EXAM: LUMBAR SPINE - COMPLETE 4+ VIEW  COMPARISON:  11/15/2009 and CT 06/22/2014  FINDINGS: Examination demonstrates mild spondylosis of the lumbar spine with moderate facet arthropathy over the mid to lower lumbar spine. There is disc space narrowing at the L4-5 level. There is a subtle grade 1 anterolisthesis of L4 on L5 with slight interval progression. No evidence of compression fracture.  IMPRESSION: Minimal spondylosis of the lumbar spine with disc disease at the L4-5 level.  Grade 1 anterolisthesis of L4 on L5 with slight interval progression.   Electronically Signed   By: Elberta Fortis M.D.   On: 08/04/2015 17:37    Knee-L DG 4 views:  Results for orders placed during the hospital encounter of 11/25/14  DG Knee Complete 4 Views Left   Narrative CLINICAL DATA:  Left knee pain for 1 month with worsening over the last 2 weeks, no known injury, history of torn meniscus  EXAM: LEFT KNEE - COMPLETE 4+ VIEW  COMPARISON:  None.  FINDINGS: Significant  joint space narrowing is noted medially with subchondral sclerosis and osteophytic changes. No significant joint effusion is seen. Mild patellofemoral degenerative changes are noted as well.  IMPRESSION: Degenerative change without acute abnormality.   Electronically Signed   By: Alcide Clever M.D.   On: 11/25/2014 16:57     Foot Imaging: Foot-R DG Complete:  Results for orders placed in visit on 09/02/13  DG Foot Complete Right   Narrative 3 views of the right foot demonstrates a osseously mature foot rectus in  nature some early joint space narrowing indicative of osteoarthritis first  metatarsophalangeal joint some spurring is noted there is well and os  navicularis present   Foot-L DG Complete: No results found for this or any previous visit.  Complexity Note: Imaging results reviewed. Results shared with Ms. Glidewell, using Layman's terms.                         Meds   Current Outpatient Medications:  .  Cholecalciferol (VITAMIN D) 2000 UNITS CAPS, Take 2,000 Units by mouth daily., Disp: , Rfl:  .  diclofenac sodium (VOLTAREN) 1 % GEL, Apply 2 g topically 4 (four) times daily. Uses a couple time a week, Disp: 100 g, Rfl: 2 .  ezetimibe (ZETIA) 10 MG tablet, Take 10 mg by mouth daily., Disp: , Rfl: 0 .  felodipine (PLENDIL) 5 MG 24 hr tablet, Take by mouth., Disp: , Rfl:  .  fluticasone (FLONASE) 50 MCG/ACT nasal spray, Place 2 sprays into both nostrils daily. (Patient taking differently: Place 2 sprays into both nostrils as needed. ), Disp: 16 g, Rfl: 6 .  gabapentin (NEURONTIN) 100 MG capsule, Take 200 mg by mouth at bedtime., Disp: , Rfl:  .  ibuprofen (ADVIL,MOTRIN) 200 MG tablet, Take 400 mg by mouth as needed. , Disp: , Rfl:  .  lidocaine (LIDODERM) 5 %, Place 1 patch onto the skin daily. Remove & Discard patch within 12 hours., Disp: 30 patch, Rfl: 0 .  meloxicam (MOBIC) 15 MG tablet, Take 1 tablet (15 mg total) by  mouth daily as needed for pain., Disp: 30  tablet, Rfl: 3 .  naproxen sodium (ANAPROX) 220 MG tablet, Take 440 mg by mouth daily as needed., Disp: , Rfl:  .  nystatin cream (MYCOSTATIN), Apply 1 application topically 2 (two) times daily., Disp: , Rfl:  .  tizanidine (ZANAFLEX) 2 MG capsule, 2-4 mg BID prn spams, Disp: 60 capsule, Rfl: 2 .  traMADol (ULTRAM) 50 MG tablet, Take 1-2 tablets (50-100 mg total) by mouth every 8 (eight) hours as needed., Disp: 90 tablet, Rfl: 1 .  amLODipine (NORVASC) 10 MG tablet, take 1 tablet by mouth once daily (Patient not taking: Reported on 08/20/2017), Disp: 30 tablet, Rfl: 4 .  aspirin 81 MG tablet, Take 81 mg by mouth daily., Disp: , Rfl:  .  hydrochlorothiazide (HYDRODIURIL) 25 MG tablet, Take 25 mg by mouth daily., Disp: , Rfl: 1 .  HYDROcodone-acetaminophen (NORCO/VICODIN) 5-325 MG tablet, Take 1 tablet by mouth 3 (three) times daily as needed for moderate pain. (Patient not taking: Reported on 08/20/2017), Disp: 30 tablet, Rfl: 0 .  Magnesium 250 MG TABS, Take 250 mg by mouth daily., Disp: , Rfl:  .  pravastatin (PRAVACHOL) 20 MG tablet, Take 1 tablet (20 mg total) by mouth at bedtime. (Patient not taking: Reported on 03/19/2017), Disp: 90 tablet, Rfl: 1  ROS  Constitutional: Denies any fever or chills Gastrointestinal: No reported hemesis, hematochezia, vomiting, or acute GI distress Musculoskeletal: Denies any acute onset joint swelling, redness, loss of ROM, or weakness Neurological: No reported episodes of acute onset apraxia, aphasia, dysarthria, agnosia, amnesia, paralysis, loss of coordination, or loss of consciousness  Allergies  Ms. Bevels is allergic to latex; lisinopril; and shrimp [shellfish allergy].  PFSH  Drug: Ms. Damore  reports that she does not use drugs. Alcohol:  reports that she drinks alcohol. Tobacco:  reports that she has never smoked. She has never used smokeless tobacco. Medical:  has a past medical history of Arthritis, Colon polyps (2010), Genital  warts, GERD (gastroesophageal reflux disease), Heart murmur, Hepatitis B, History of blood transfusion, History of chicken pox, History of hiatal hernia, History of torn meniscus of right knee, HTN (hypertension), migraine headaches, Hypertension, and Hypopotassemia. Surgical: Ms. Jarret  has a past surgical history that includes Tonsillectomy; Vesicovaginal fistula closure w/ TAH; Cesarean section; laproscopic Left knee repair (1971); Ectopic pregnancy surgery; Total abdominal hysterectomy w/ bilateral salpingoophorectomy (2006); Knee arthroscopy; Appendectomy (1980); Abdominal hysterectomy; Hernia repair; and Colonoscopy with propofol (N/A, 11/01/2014). Family: family history includes Alzheimer's disease in her mother; Birth defects in her maternal grandmother; Breast cancer in her sister; Cancer in her sister; Colon cancer in her maternal grandmother; Dementia in her mother; Hyperlipidemia in her maternal grandfather and unknown relative; Hypertension in her maternal grandfather and mother.  Constitutional Exam  General appearance: Well nourished, well developed, and well hydrated. In no apparent acute distress Vitals:   08/20/17 0846  BP: (!) 143/87  Pulse: 67  Resp: 18  Temp: 98.2 F (36.8 C)  SpO2: 99%  Weight: 215 lb (97.5 kg)  Height: 5\' 2"  (1.575 m)   BMI Assessment: Estimated body mass index is 39.32 kg/m as calculated from the following:   Height as of this encounter: 5\' 2"  (1.575 m).   Weight as of this encounter: 215 lb (97.5 kg).  BMI interpretation table: BMI level Category Range association with higher incidence of chronic pain  <18 kg/m2 Underweight   18.5-24.9 kg/m2 Ideal body weight   25-29.9 kg/m2 Overweight Increased incidence by 20%  30-34.9 kg/m2 Obese (Class I) Increased incidence by 68%  35-39.9 kg/m2 Severe obesity (Class II) Increased incidence by 136%  >40 kg/m2 Extreme obesity (Class III) Increased incidence by 254%   BMI Readings from Last 4  Encounters:  08/20/17 39.32 kg/m  05/30/17 42.14 kg/m  04/24/17 40.97 kg/m  04/10/17 42.07 kg/m   Wt Readings from Last 4 Encounters:  08/20/17 215 lb (97.5 kg)  05/30/17 223 lb (101.2 kg)  04/24/17 224 lb (101.6 kg)  04/10/17 230 lb (104.3 kg)  Psych/Mental status: Alert, oriented x 3 (person, place, & time)       Eyes: PERLA Respiratory: No evidence of acute respiratory distress  Cervical Spine Area Exam  Skin & Axial Inspection: No masses, redness, edema, swelling, or associated skin lesions Alignment: Symmetrical Functional ROM: Unrestricted ROM      Stability: No instability detected Muscle Tone/Strength: Functionally intact. No obvious neuro-muscular anomalies detected. Sensory (Neurological): Unimpaired Palpation: No palpable anomalies              Upper Extremity (UE) Exam    Side: Right upper extremity  Side: Left upper extremity  Skin & Extremity Inspection: Skin color, temperature, and hair growth are WNL. No peripheral edema or cyanosis. No masses, redness, swelling, asymmetry, or associated skin lesions. No contractures.  Skin & Extremity Inspection: Skin color, temperature, and hair growth are WNL. No peripheral edema or cyanosis. No masses, redness, swelling, asymmetry, or associated skin lesions. No contractures.  Functional ROM: Unrestricted ROM          Functional ROM: Unrestricted ROM          Muscle Tone/Strength: Functionally intact. No obvious neuro-muscular anomalies detected.  Muscle Tone/Strength: Functionally intact. No obvious neuro-muscular anomalies detected.  Sensory (Neurological): Unimpaired          Sensory (Neurological): Unimpaired          Palpation: No palpable anomalies              Palpation: No palpable anomalies              Specialized Test(s): Deferred         Specialized Test(s): Deferred          Thoracic Spine Area Exam  Skin & Axial Inspection: No masses, redness, or swelling Alignment: Symmetrical Functional ROM: Unrestricted  ROM Stability: No instability detected Muscle Tone/Strength: Functionally intact. No obvious neuro-muscular anomalies detected. Sensory (Neurological): Unimpaired Muscle strength & Tone: No palpable anomalies  Lumbar Spine Area Exam  Skin & Axial Inspection: No masses, redness, or swelling Alignment: Symmetrical Functional ROM: Unrestricted ROM      Stability: No instability detected Muscle Tone/Strength: Functionally intact. No obvious neuro-muscular anomalies detected. Sensory (Neurological): Unimpaired Palpation: No palpable anomalies       Provocative Tests: Lumbar Hyperextension and rotation test: Positive bilaterally for facet joint pain., improved after treatment Lumbar Lateral bending test: evaluation deferred today       Patrick's Maneuver: evaluation deferred today                    Gait & Posture Assessment  Ambulation: Unassisted Gait: Relatively normal for age and body habitus Posture: WNL   Lower Extremity Exam    Side: Right lower extremity  Side: Left lower extremity  Skin & Extremity Inspection: Skin color, temperature, and hair growth are WNL. No peripheral edema or cyanosis. No masses, redness, swelling, asymmetry, or associated skin lesions. No contractures.  Skin & Extremity Inspection: Skin color, temperature, and  hair growth are WNL. No peripheral edema or cyanosis. No masses, redness, swelling, asymmetry, or associated skin lesions. No contractures.  Functional ROM: Unrestricted ROM          Functional ROM: Unrestricted ROM          Muscle Tone/Strength: Functionally intact. No obvious neuro-muscular anomalies detected.  Muscle Tone/Strength: Functionally intact. No obvious neuro-muscular anomalies detected.  Sensory (Neurological): Unimpaired  Sensory (Neurological): Unimpaired  Palpation: No palpable anomalies  Palpation: No palpable anomalies   Assessment  Primary Diagnosis & Pertinent Problem List: The primary encounter diagnosis was Lumbar  spondylolysis. Diagnoses of Lumbar facet arthropathy, Lumbar degenerative disc disease, Chronic low back pain, unspecified back pain laterality, with sciatica presence unspecified, and Chronic pain syndrome were also pertinent to this visit.  Status Diagnosis  Controlled Controlled Controlled 1. Lumbar spondylolysis   2. Lumbar facet arthropathy   3. Lumbar degenerative disc disease   4. Chronic low back pain, unspecified back pain laterality, with sciatica presence unspecified   5. Chronic pain syndrome     Problems updated and reviewed during this visit: Problem  Lumbar Spondylolysis  Lumbar Facet Arthropathy  Lumbar Degenerative Disc Disease   67 year old female with a history of axial low back pain secondary to lumbar spondylosis, facet arthropathy who presents for follow-up status post left L3, 4, L5 radiofrequency ablation on 04/03/2017 followed by right L3, 4, 5 radiofrequency ablation on 04/24/2017.  Patient is continuing to endorse pain relief after her lumbar medial branch radiofrequency ablations.  She does have intermittent episodes of axial low back pain that are worse in the evenings.  She is able to manage these to a greater extent than she was prior to her ablations.  At this point she is pleased with her results.  We will continue to monitor her progress.  Patient will follow-up with me in 3 months.  Provider-requested follow-up: No follow-ups on file.  Future Appointments  Date Time Provider Department Center  11/19/2017  8:30 AM Edward Jolly, MD [facility] None  02/07/2018  1:00 PM [facility] None  02/07/2018  2:00 PM Dew, Marlow Baars, MD [facility] None    Primary Care Physician: Leotis Shames, MD Location: [facility] Note by: Edward Jolly, M.D Date: 08/20/2017; Time: 9:07 AM  There are no Patient Instructions on file for this visit.

## 2017-08-20 NOTE — Progress Notes (Signed)
Safety precautions to be maintained throughout the outpatient stay will include: orient to surroundings, keep bed in low position, maintain call bell within reach at all times, provide assistance with transfer out of bed and ambulation.  

## 2017-08-22 ENCOUNTER — Ambulatory Visit: Payer: Managed Care, Other (non HMO) | Admitting: Student in an Organized Health Care Education/Training Program

## 2017-08-27 ENCOUNTER — Ambulatory Visit: Payer: Managed Care, Other (non HMO) | Admitting: Student in an Organized Health Care Education/Training Program

## 2017-11-15 ENCOUNTER — Other Ambulatory Visit: Payer: Self-pay | Admitting: Obstetrics and Gynecology

## 2017-11-15 DIAGNOSIS — Z1231 Encounter for screening mammogram for malignant neoplasm of breast: Secondary | ICD-10-CM

## 2017-11-19 ENCOUNTER — Ambulatory Visit: Payer: Managed Care, Other (non HMO) | Admitting: Student in an Organized Health Care Education/Training Program

## 2017-12-06 ENCOUNTER — Ambulatory Visit
Admission: RE | Admit: 2017-12-06 | Discharge: 2017-12-06 | Disposition: A | Discharge status: Home / Self Care | Payer: Managed Care, Other (non HMO) | Source: Ambulatory Visit | Attending: Obstetrics and Gynecology | Admitting: Obstetrics and Gynecology

## 2017-12-06 DIAGNOSIS — Z1231 Encounter for screening mammogram for malignant neoplasm of breast: Secondary | ICD-10-CM

## 2018-02-07 ENCOUNTER — Ambulatory Visit (INDEPENDENT_AMBULATORY_CARE_PROVIDER_SITE_OTHER): Payer: No Typology Code available for payment source | Admitting: Vascular Surgery

## 2018-02-07 ENCOUNTER — Encounter (INDEPENDENT_AMBULATORY_CARE_PROVIDER_SITE_OTHER): Payer: No Typology Code available for payment source

## 2018-02-14 ENCOUNTER — Encounter (INDEPENDENT_AMBULATORY_CARE_PROVIDER_SITE_OTHER): Payer: Self-pay | Admitting: Vascular Surgery

## 2018-02-14 ENCOUNTER — Ambulatory Visit (INDEPENDENT_AMBULATORY_CARE_PROVIDER_SITE_OTHER): Payer: Managed Care, Other (non HMO) | Admitting: Vascular Surgery

## 2018-02-14 ENCOUNTER — Ambulatory Visit (INDEPENDENT_AMBULATORY_CARE_PROVIDER_SITE_OTHER): Payer: Managed Care, Other (non HMO)

## 2018-02-14 ENCOUNTER — Other Ambulatory Visit (INDEPENDENT_AMBULATORY_CARE_PROVIDER_SITE_OTHER): Payer: Self-pay | Admitting: Vascular Surgery

## 2018-02-14 VITALS — BP 134/84 | HR 62 | Resp 16 | Ht 62.0 in | Wt 234.6 lb

## 2018-02-14 DIAGNOSIS — I739 Peripheral vascular disease, unspecified: Secondary | ICD-10-CM

## 2018-02-14 DIAGNOSIS — I10 Essential (primary) hypertension: Secondary | ICD-10-CM

## 2018-02-14 DIAGNOSIS — I6529 Occlusion and stenosis of unspecified carotid artery: Secondary | ICD-10-CM

## 2018-02-14 DIAGNOSIS — E785 Hyperlipidemia, unspecified: Secondary | ICD-10-CM

## 2018-02-14 DIAGNOSIS — M79609 Pain in unspecified limb: Secondary | ICD-10-CM | POA: Insufficient documentation

## 2018-02-14 DIAGNOSIS — I6523 Occlusion and stenosis of bilateral carotid arteries: Secondary | ICD-10-CM | POA: Diagnosis not present

## 2018-02-14 DIAGNOSIS — M79604 Pain in right leg: Secondary | ICD-10-CM | POA: Diagnosis not present

## 2018-02-14 DIAGNOSIS — M79605 Pain in left leg: Secondary | ICD-10-CM

## 2018-02-14 DIAGNOSIS — I779 Disorder of arteries and arterioles, unspecified: Secondary | ICD-10-CM

## 2018-02-14 NOTE — Assessment & Plan Note (Signed)
lipid control important in reducing the progression of atherosclerotic disease. Continue statin therapy  

## 2018-02-14 NOTE — Assessment & Plan Note (Signed)
She has some discoloration as well as some discomfort and swelling of her legs.  At current, it is fairly mild.  I discussed with her the pathophysiology and natural history of venous disease.  I told her if this becomes more bothersome to her, I would recommend bilateral venous reflux studies to be performed.  She can wear compression stockings and elevate her legs.

## 2018-02-14 NOTE — Assessment & Plan Note (Signed)
Her carotid duplex reveals mild intimal thickening bilaterally with no hemodynamically significant stenosis in either carotid artery. She takes an aspirin a day.  This can be checked every 2 to 3 years with duplex for follow-up.  No intervention currently recommended.

## 2018-02-14 NOTE — Progress Notes (Signed)
MRN : [id number]  Terri Werner is a 67 y.o. (October 27, 1950) female who presents with chief complaint of  Chief Complaint  Patient presents with  . Carotid    65yr follow up  .  History of Present Illness: Patient returns in follow-up of her mild carotid disease.  She is doing well without focal neurologic symptoms.  Her only symptoms now are of some intermittent swelling and discomfort in her lower legs bilaterally.  This is associated with some darkening of the skin as well.  This has been a slowly progressive problem over a couple of years time. Her carotid duplex reveals mild intimal thickening bilaterally with no hemodynamically significant stenosis in either carotid artery.  Current Outpatient Medications  Medication Sig Dispense Refill  . aspirin 81 MG tablet Take 81 mg by mouth daily.    . Cholecalciferol (VITAMIN D) 2000 UNITS CAPS Take 2,000 Units by mouth daily.    . diclofenac sodium (VOLTAREN) 1 % GEL Apply 2 g topically 4 (four) times daily. Uses a couple time a week 100 g 2  . ezetimibe (ZETIA) 10 MG tablet Take 10 mg by mouth daily.  0  . fluticasone (FLONASE) 50 MCG/ACT nasal spray Place 2 sprays into both nostrils daily. (Patient taking differently: Place 2 sprays into both nostrils as needed. ) 16 g 6  . hydrochlorothiazide (HYDRODIURIL) 25 MG tablet Take 25 mg by mouth daily.  1  . ibuprofen (ADVIL,MOTRIN) 200 MG tablet Take 400 mg by mouth as needed.     . lidocaine (LIDODERM) 5 % Place 1 patch onto the skin daily. Remove & Discard patch within 12 hours. 30 patch 0  . naproxen sodium (ANAPROX) 220 MG tablet Take 440 mg by mouth daily as needed.    . traMADol (ULTRAM) 50 MG tablet Take 1-2 tablets (50-100 mg total) by mouth every 8 (eight) hours as needed. 90 tablet 1  . amLODipine (NORVASC) 10 MG tablet take 1 tablet by mouth once daily (Patient not taking: Reported on 08/20/2017) 30 tablet 4  . felodipine (PLENDIL) 5 MG 24 hr tablet Take by mouth.    .  gabapentin (NEURONTIN) 100 MG capsule Take 200 mg by mouth at bedtime.    Marland Kitchen HYDROcodone-acetaminophen (NORCO/VICODIN) 5-325 MG tablet Take 1 tablet by mouth 3 (three) times daily as needed for moderate pain. (Patient not taking: Reported on 08/20/2017) 30 tablet 0  . Magnesium 250 MG TABS Take 250 mg by mouth daily.    . meloxicam (MOBIC) 15 MG tablet Take 1 tablet (15 mg total) by mouth daily as needed for pain. (Patient not taking: Reported on 02/14/2018) 30 tablet 3  . nystatin cream (MYCOSTATIN) Apply 1 application topically 2 (two) times daily.    . pravastatin (PRAVACHOL) 20 MG tablet Take 1 tablet (20 mg total) by mouth at bedtime. (Patient not taking: Reported on 03/19/2017) 90 tablet 1  . tizanidine (ZANAFLEX) 2 MG capsule 2-4 mg BID prn spams (Patient not taking: Reported on 02/14/2018) 60 capsule 2   No current facility-administered medications for this visit.     Past Medical History:  Diagnosis Date  . Arthritis    pt reports in back & knees  . Colon polyps 2010   At [facility]  . Genital warts   . GERD (gastroesophageal reflux disease)   . Heart murmur   . Hepatitis B   . History of blood transfusion    during each major surgery per pt  . History of chicken pox   .  History of hiatal hernia   . History of torn meniscus of right knee   . HTN (hypertension)   . Hx of migraine headaches   . Hypertension   . Hypopotassemia     Past Surgical History:  Procedure Laterality Date  . ABDOMINAL HYSTERECTOMY    . APPENDECTOMY  1980  . CESAREAN SECTION    . COLONOSCOPY WITH PROPOFOL N/A 11/01/2014   Procedure: COLONOSCOPY WITH PROPOFOL;  Surgeon: Scot Jun, MD;  Location: [facility] ENDOSCOPY;  Service: Endoscopy;  Laterality: N/A;  . ECTOPIC PREGNANCY SURGERY    . HERNIA REPAIR    . KNEE ARTHROSCOPY    . laproscopic Left knee repair  1971  . TONSILLECTOMY    . TOTAL ABDOMINAL HYSTERECTOMY W/ BILATERAL SALPINGOOPHORECTOMY  2006  . VESICOVAGINAL FISTULA CLOSURE W/ TAH      Social  History Social History   Tobacco Use  . Smoking status: Never Smoker  . Smokeless tobacco: Never Used  Substance Use Topics  . Alcohol use: Yes    Comment: Occasional  . Drug use: No     Family History Family History  Problem Relation Age of Onset  . Cancer Sister        breast cancer  . Breast cancer Sister   . Hypertension Mother   . Alzheimer's disease Mother   . Dementia Mother   . Hyperlipidemia Unknown   . Hypertension Maternal Grandfather   . Hyperlipidemia Maternal Grandfather   . Birth defects Maternal Grandmother        Bladder  . Colon cancer Maternal Grandmother     Allergies  Allergen Reactions  . Latex Itching    Burning  . Lisinopril Other (See Comments)    cough  . Shrimp [Shellfish Allergy] Swelling     REVIEW OF SYSTEMS (Negative unless checked)  Constitutional: [] Weight loss  [] Fever  [] Chills Cardiac: [] Chest pain   [] Chest pressure   [] Palpitations   [] Shortness of breath when laying flat   [] Shortness of breath at rest   [] Shortness of breath with exertion. Vascular:  [] Pain in legs with walking   [] Pain in legs at rest   [] Pain in legs when laying flat   [] Claudication   [] Pain in feet when walking  [] Pain in feet at rest  [] Pain in feet when laying flat   [] History of DVT   [] Phlebitis   [x] Swelling in legs   [] Varicose veins   [] Non-healing ulcers Pulmonary:   [] Uses home oxygen   [] Productive cough   [] Hemoptysis   [] Wheeze  [] COPD   [] Asthma Neurologic:  [] Dizziness  [] Blackouts   [] Seizures   [] History of stroke   [] History of TIA  [] Aphasia   [] Temporary blindness   [] Dysphagia   [] Weakness or numbness in arms   [] Weakness or numbness in legs Musculoskeletal:  [x] Arthritis   [] Joint swelling   [] Joint pain   [] Low back pain Hematologic:  [] Easy bruising  [] Easy bleeding   [] Hypercoagulable state   [] Anemic  [] Hepatitis Gastrointestinal:  [] Blood in stool   [] Vomiting blood  [x] Gastroesophageal reflux/heartburn   [] Difficulty  swallowing. Genitourinary:  [] Chronic kidney disease   [] Difficult urination  [] Frequent urination  [] Burning with urination   [] Blood in urine Skin:  [] Rashes   [] Ulcers   [] Wounds Psychological:  [] History of anxiety   []  History of major depression.  Physical Examination  Vitals:   02/14/18 1401 02/14/18 1402  BP: (!) 147/88 134/84  Pulse: 62   Resp: 16   Weight: 234 lb  9.6 oz (106.4 kg)   Height: 5\' 2"  (1.575 m)    Body mass index is 42.91 kg/m. Gen:  WD/WN, NAD Head: [place]/AT, No temporalis wasting. Ear/Nose/Throat: Hearing grossly intact, nares w/o erythema or drainage, trachea midline Eyes: Conjunctiva clear. Sclera non-icteric Neck: Supple.  No bruit  Pulmonary:  Good air movement, equal and clear to auscultation bilaterally.  Cardiac: RRR, No JVD Vascular: Mild stasis dermatitis changes to both lower extremities Vessel Right Left  Radial Palpable Palpable                                    Musculoskeletal: M/S 5/5 throughout.  No deformity or atrophy.  Trace bilateral lower extremity edema. Neurologic: CN 2-12 intact. Sensation grossly intact in extremities.  Symmetrical.  Speech is fluent. Motor exam as listed above. Psychiatric: Judgment intact, Mood & affect appropriate for pt's clinical situation. Dermatologic: No rashes or ulcers noted.  No cellulitis or open wounds.      CBC Lab Results  Component Value Date   WBC 8.4 07/05/2014   HGB 12.3 07/05/2014   HCT 36.9 07/05/2014   MCV 85.0 07/05/2014   PLT 315.0 07/05/2014    BMET    Component Value Date/Time   NA 142 07/05/2014 1455   NA 141 07/03/2012 1718   K 4.4 07/05/2014 1455   K 3.7 07/03/2012 1718   CL 106 07/05/2014 1455   CL 109 (H) 07/03/2012 1718   CO2 31 07/05/2014 1455   CO2 27 07/03/2012 1718   GLUCOSE 100 (H) 07/05/2014 1455   GLUCOSE 77 07/03/2012 1718   BUN 15 07/05/2014 1455   BUN 10 07/03/2012 1718   CREATININE 0.89 07/05/2014 1455   CREATININE 0.81 07/03/2012 1718    CALCIUM 9.4 07/05/2014 1455   CALCIUM 9.0 07/03/2012 1718   GFRNONAA >60 07/03/2012 1718   GFRAA >60 07/03/2012 1718   CrCl cannot be calculated (Patient's most recent lab result is older than the maximum 21 days allowed.).  COAG No results found for: INR, PROTIME  Radiology No results found.   Assessment/Plan HYPERTENSION, BENIGN blood pressure control important in reducing the progression of atherosclerotic disease. On appropriate oral medications.   Hyperlipidemia lipid control important in reducing the progression of atherosclerotic disease. Continue statin therapy   Pain in limb She has some discoloration as well as some discomfort and swelling of her legs.  At current, it is fairly mild.  I discussed with her the pathophysiology and natural history of venous disease.  I told her if this becomes more bothersome to her, I would recommend bilateral venous reflux studies to be performed.  She can wear compression stockings and elevate her legs.  Carotid artery disorder Her carotid duplex reveals mild intimal thickening bilaterally with no hemodynamically significant stenosis in either carotid artery. She takes an aspirin a day.  This can be checked every 2 to 3 years with duplex for follow-up.  No intervention currently recommended.    Festus Barren, MD  02/14/2018 2:34 PM    This note was created with Dragon medical transcription system.  Any errors from dictation are purely unintentional

## 2018-02-14 NOTE — Assessment & Plan Note (Signed)
blood pressure control important in reducing the progression of atherosclerotic disease. On appropriate oral medications.  

## 2018-03-05 ENCOUNTER — Encounter: Payer: Self-pay | Admitting: Family Medicine

## 2018-03-05 ENCOUNTER — Ambulatory Visit (INDEPENDENT_AMBULATORY_CARE_PROVIDER_SITE_OTHER): Payer: Managed Care, Other (non HMO) | Admitting: Family Medicine

## 2018-03-05 VITALS — BP 154/86 | HR 86 | Temp 98.2°F | Resp 16 | Ht 62.0 in | Wt 232.6 lb

## 2018-03-05 DIAGNOSIS — I10 Essential (primary) hypertension: Secondary | ICD-10-CM

## 2018-03-05 DIAGNOSIS — E2839 Other primary ovarian failure: Secondary | ICD-10-CM

## 2018-03-05 DIAGNOSIS — K5909 Other constipation: Secondary | ICD-10-CM

## 2018-03-05 DIAGNOSIS — E7800 Pure hypercholesterolemia, unspecified: Secondary | ICD-10-CM

## 2018-03-05 DIAGNOSIS — M4696 Unspecified inflammatory spondylopathy, lumbar region: Secondary | ICD-10-CM | POA: Diagnosis not present

## 2018-03-05 DIAGNOSIS — Z23 Encounter for immunization: Secondary | ICD-10-CM

## 2018-03-05 DIAGNOSIS — F4321 Adjustment disorder with depressed mood: Secondary | ICD-10-CM

## 2018-03-05 DIAGNOSIS — Z13820 Encounter for screening for osteoporosis: Secondary | ICD-10-CM

## 2018-03-05 MED ORDER — HYDROCHLOROTHIAZIDE 25 MG PO TABS
25.0000 mg | ORAL_TABLET | Freq: Every day | ORAL | 0 refills | Status: DC
Start: 2018-03-05 — End: 2018-04-03

## 2018-03-05 MED ORDER — LUBIPROSTONE 24 MCG PO CAPS: 24.0000 ug | ORAL_CAPSULE | Freq: Two times a day (BID) | ORAL | 0 refills | Status: DC

## 2018-03-05 MED ORDER — AMLODIPINE BESYLATE 2.5 MG PO TABS
2.5000 mg | ORAL_TABLET | Freq: Every day | ORAL | 0 refills | Status: DC
Start: 2018-03-05 — End: 2018-04-03

## 2018-03-05 MED ORDER — ROSUVASTATIN CALCIUM 5 MG PO TABS: 5.0000 mg | ORAL_TABLET | Freq: Every day | ORAL | 0 refills | Status: DC

## 2018-03-05 NOTE — Patient Instructions (Addendum)
Contact [facility], ask for [employer] - [employer] bars or [employer]  Protein shakes : Premier ( high in protein and low in sugar)

## 2018-03-05 NOTE — Progress Notes (Signed)
zName: Terri Werner   MRN: [id number]    DOB: Sep 17, 1950   Date:03/05/2018       Progress Note  Subjective  Chief Complaint  Chief Complaint  Patient presents with  . Establish Care    HPI  HTN: used to be uncontrolled in the past, but states recently has been around 130-140's, however today at dentist it was up to 170/94 and is still elevated now, we will resume norvasc at lower dose and monitor. She denies chest pain or SOB  Chronic low back pain: she used to see Dr. Cherylann Ratel, also seen by neurosurgeon in the past and Dr. Council Mechanic. She states pain is daily around 3/10. She states she has been walking more and still takes Alevel a few times a week to improve symptoms, advised to switch to Tylenol.   Morbid obesity: she started to gain weight around 2011 when taking care of husband ( died of cancer) after that also took care of her sister, she has a stressful job and skips meals. Discussed importance of breakfast, packing lunch and avoid sweet beverages.   Chronic constipation: she states always had problems, but over the past year seems to be a little worse. She takes miralax and otc laxatives to have a bowel movement. Bristol scale usually 3, 4 with laxatives  We will try her on  Amitiza, bowel movements about every other day now  Hyperlipidemia: taking zetia, tried pravastatin but caused muscle pain, she has carotid atherosclerosis, we will try small dose crestor and recheck labs, advised to add coq10 supplementation.   Situational depression: daughter is divorced has 3 children and they had to move near her, she has been supporting her financially, also has a stressful job. 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    Patient Active Problem List   Diagnosis Date Noted  . Unspecified inflammatory spondylopathy, lumbar region (HCC) 03/05/2018  . Lumbar spondylolysis 08/20/2017  . Lumbar facet arthropathy 08/20/2017  . Lumbar degenerative disc  disease 08/20/2017  . Chronic constipation 07/30/2017  . Primary osteoarthritis involving multiple joints 04/17/2017  . Postmenopausal 10/12/2016  . Prediabetes 10/12/2016  . Lumbago 08/04/2015  . Diverticulitis of colon 07/05/2014  . Carotid artery disorder (HCC) 04/27/2014  . Hyperlipidemia 10/08/2013  . Arthralgia 06/22/2011  . Hypertension, benign 11/19/2009    Past Surgical History:  Procedure Laterality Date  . ABDOMINAL HYSTERECTOMY  2006  . APPENDECTOMY  1980  . CESAREAN SECTION  1981  . COLONOSCOPY WITH PROPOFOL N/A 11/01/2014   Procedure: COLONOSCOPY WITH PROPOFOL;  Surgeon: Scot Jun, MD;  Location: [facility] ENDOSCOPY;  Service: Endoscopy;  Laterality: N/A;  . ECTOPIC PREGNANCY SURGERY  1980  . HERNIA REPAIR  2006   Umbilical  . KNEE ARTHROSCOPY Left 2006   torn meniscus  . TONSILLECTOMY  1971  . TOTAL ABDOMINAL HYSTERECTOMY W/ BILATERAL SALPINGOOPHORECTOMY  2006  . VESICOVAGINAL FISTULA CLOSURE W/ TAH      Family History  Problem Relation Age of Onset  . Breast cancer Sister 2  . Hypertension Mother   . Alzheimer's disease Mother   . Dementia Mother   . Hypertension Maternal Grandfather   . Hyperlipidemia Maternal Grandfather   . Birth defects Maternal Grandmother        Bladder  . Colon cancer Maternal Grandmother   . Congestive Heart Failure Father   . Hyperlipidemia Father   . Dementia Paternal Grandmother   . Dementia Paternal Grandfather   . Thyroid disease Sister  Social History   Socioeconomic History  . Marital status: Widowed    Spouse name: Not on file  . Number of children: 2  . Years of education: Not on file  . Highest education level: Master's degree (e.g., MA, MS, MEng, MEd, MSW, MBA)  Occupational History  . Occupation: [employer] [employer]    Comment: Full Time    Employer: [employer]  Social Needs  . Financial resource strain: Hard  . Food insecurity:    Worry: Never true     Inability: Never true  . Transportation needs:    Medical: No    Non-medical: No  Tobacco Use  . Smoking status: Never Smoker  . Smokeless tobacco: Never Used  Substance and Sexual Activity  . Alcohol use: Yes    Comment: Occasional  . Drug use: No  . Sexual activity: Not Currently    Partners: Male  Lifestyle  . Physical activity:    Days per week: 3 days    Minutes per session: 20 min  . Stress: Not at all  Relationships  . Social connections:    Talks on phone: More than three times a week    Gets together: Twice a week    Attends religious service: More than 4 times per year    Active member of club or organization: Yes    Attends meetings of clubs or organizations: More than 4 times per year    Relationship status: Widowed  . Intimate partner violence:    Fear of current or ex partner: No    Emotionally abused: No    Physically abused: No    Forced sexual activity: No  Other Topics Concern  . Not on file  Social History Narrative   Works for [employer] and helps her 3 grandchildren she helps with since her daughter just recently became employed again after surgery.      Regular Exercise -  NO   Daily Caffeine Use:  1 soda/tea            Current Outpatient Medications:  .  aspirin 81 MG tablet, Take 81 mg by mouth daily., Disp: , Rfl:  .  Cholecalciferol (VITAMIN D) 2000 UNITS CAPS, Take 2,000 Units by mouth daily., Disp: , Rfl:  .  diclofenac sodium (VOLTAREN) 1 % GEL, Apply 2 g topically 4 (four) times daily. Uses a couple time a week, Disp: 100 g, Rfl: 2 .  ezetimibe (ZETIA) 10 MG tablet, Take 10 mg by mouth daily., Disp: , Rfl: 0 .  fluticasone (FLONASE) 50 MCG/ACT nasal spray, Place 2 sprays into both nostrils daily. (Patient taking differently: Place 2 sprays into both nostrils as needed. ), Disp: 16 g, Rfl: 6 .  hydrochlorothiazide (HYDRODIURIL) 25 MG tablet, Take 1 tablet (25 mg total) by mouth daily., Disp: 90 tablet, Rfl: 0 .  lidocaine (LIDODERM) 5 %,  Place 1 patch onto the skin daily. Remove & Discard patch within 12 hours., Disp: 30 patch, Rfl: 0 .  amLODipine (NORVASC) 2.5 MG tablet, Take 1 tablet (2.5 mg total) by mouth daily., Disp: 90 tablet, Rfl: 0 .  rosuvastatin (CRESTOR) 5 MG tablet, Take 1 tablet (5 mg total) by mouth daily. In place of Zetia to see if you can tolerate, with CoQ10, Disp: 30 tablet, Rfl: 0  Allergies  Allergen Reactions  . Latex Itching    Burning  . Lisinopril Other (See Comments)    cough  . Pravastatin  Muscle ache   . Shrimp [Shellfish Allergy] Swelling    Lips will burn     I personally reviewed active problem list, medication list, allergies, family history, social history, health maintenance with the patient/caregiver today.   ROS  Constitutional: Negative for fever or weight change.  Respiratory: Negative for cough and shortness of breath.   Cardiovascular: Negative for chest pain or palpitations.  Gastrointestinal: Negative for abdominal pain, no bowel changes - worsening of constipation .  Musculoskeletal: positive  for gait problem but no  joint swelling.  Skin: Negative for rash.  Neurological: Negative for dizziness or headache.  No other specific complaints in a complete review of systems (except as listed in HPI above).  Objective  Vitals:   03/05/18 1318  BP: (!) 154/86  Pulse: 86  Resp: 16  Temp: 98.2 F (36.8 C)  TempSrc: Oral  SpO2: 98%  Weight: 232 lb 9.6 oz (105.5 kg)  Height: 5\' 2"  (1.575 m)    Body mass index is 42.54 kg/m.  Physical Exam  Constitutional: Patient appears well-developed and well-nourished. Obese  No distress.  HEENT: head atraumatic, normocephalic, pupils equal and reactive to light, neck supple, throat within normal limits Cardiovascular: Normal rate, regular rhythm and normal heart sounds.  No murmur heard. No BLE edema. Pulmonary/Chest: Effort normal and breath sounds normal. No respiratory distress. Abdominal: Soft.  There is no  tenderness. Psychiatric: Patient has a normal mood and affect. behavior is normal. Judgment and thought content normal.  PHQ2/9: Depression screen [facility] 2/9 03/05/2018 08/20/2017 05/30/2017 04/24/2017 04/10/2017  Decreased Interest 0 0 0 0 0  Down, Depressed, Hopeless 2 0 0 0 0  PHQ - 2 Score 2 0 0 0 0  Altered sleeping 0 - - - -  Tired, decreased energy 0 - - - -  Change in appetite 0 - - - -  Feeling bad or failure about yourself  0 - - - -  Trouble concentrating 0 - - - -  Moving slowly or fidgety/restless 0 - - - -  Suicidal thoughts 0 - - - -  PHQ-9 Score 2 - - - -  Difficult doing work/chores Not difficult at all - - - -    Fall Risk: Fall Risk  03/05/2018 08/20/2017 05/30/2017 04/24/2017 04/10/2017  Falls in the past year? No No No No No  Number falls in past yr: - - - - -  Injury with Fall? - - - - -  Risk for fall due to : - - - - -  Risk for fall due to: Comment - - - - -  Follow up - - - - -  Comment - - - - -    Functional Status Survey: Is the patient deaf or have difficulty hearing?: No Does the patient have difficulty seeing, even when wearing glasses/contacts?: Yes(glasses) Does the patient have difficulty concentrating, remembering, or making decisions?: No Does the patient have difficulty walking or climbing stairs?: No Does the patient have difficulty dressing or bathing?: No Does the patient have difficulty doing errands alone such as visiting a doctor's office or shopping?: No   Assessment & Plan  1. Hypertension, benign  - amLODipine (NORVASC) 2.5 MG tablet; Take 1 tablet (2.5 mg total) by mouth daily.  Dispense: 90 tablet; Refill: 0 - hydrochlorothiazide (HYDRODIURIL) 25 MG tablet; Take 1 tablet (25 mg total) by mouth daily.  Dispense: 90 tablet; Refill: 0  2. Pure hypercholesterolemia  - rosuvastatin (CRESTOR) 5 MG tablet; Take 1  tablet (5 mg total) by mouth daily. In place of Zetia to see if you can tolerate, with CoQ10  Dispense: 30 tablet; Refill:  0  3. Need for vaccination for pneumococcus  - Pneumococcal conjugate vaccine 13-valent IM  4. Unspecified inflammatory spondylopathy, lumbar region [facility])  Seeing Dr. Cherylann Ratel   5. Ovarian failure  - DG Bone Density; Future  6. Osteoporosis screening  - DG Bone Density; Future  7. Morbid obesity (HCC)  Discussed with the patient the risk posed by an increased BMI. Discussed importance of portion control, calorie counting and at least 150 minutes of physical activity weekly. Avoid sweet beverages and drink more water. Eat at least 6 servings of fruit and vegetables daily    8. Situational depression   Discussed counseling first   9. Chronic constipation  - lubiprostone (AMITIZA) 24 MCG capsule; Take 1 capsule (24 mcg total) by mouth 2 (two) times daily with a meal.  Dispense: 60 capsule; Refill: 0

## 2018-04-02 ENCOUNTER — Other Ambulatory Visit: Payer: Self-pay | Admitting: Family Medicine

## 2018-04-02 DIAGNOSIS — E7800 Pure hypercholesterolemia, unspecified: Secondary | ICD-10-CM

## 2018-04-03 ENCOUNTER — Ambulatory Visit (INDEPENDENT_AMBULATORY_CARE_PROVIDER_SITE_OTHER): Payer: Managed Care, Other (non HMO) | Admitting: Family Medicine

## 2018-04-03 ENCOUNTER — Encounter: Payer: Self-pay | Admitting: Family Medicine

## 2018-04-03 VITALS — BP 128/82 | HR 73 | Temp 97.8°F | Resp 16 | Ht 62.0 in | Wt 230.4 lb

## 2018-04-03 DIAGNOSIS — Z79899 Other long term (current) drug therapy: Secondary | ICD-10-CM

## 2018-04-03 DIAGNOSIS — K5909 Other constipation: Secondary | ICD-10-CM | POA: Diagnosis not present

## 2018-04-03 DIAGNOSIS — I10 Essential (primary) hypertension: Secondary | ICD-10-CM

## 2018-04-03 DIAGNOSIS — E7800 Pure hypercholesterolemia, unspecified: Secondary | ICD-10-CM | POA: Diagnosis not present

## 2018-04-03 DIAGNOSIS — F4321 Adjustment disorder with depressed mood: Secondary | ICD-10-CM

## 2018-04-03 LAB — COMPLETE METABOLIC PANEL WITH GFR
AG Ratio: 1.4 (calc) (ref 1.0–2.5)
ALT: 8 U/L (ref 6–29)
AST: 15 U/L (ref 10–35)
Albumin: 4.3 g/dL (ref 3.6–5.1)
Alkaline phosphatase (APISO): 48 U/L (ref 33–130)
BUN/Creatinine Ratio: 19 (calc) (ref 6–22)
BUN: 21 mg/dL (ref 7–25)
CO2: 30 mmol/L (ref 20–32)
Calcium: 9.6 mg/dL (ref 8.6–10.4)
Chloride: 105 mmol/L (ref 98–110)
Creat: 1.08 mg/dL — ABNORMAL HIGH (ref 0.50–0.99)
GFR, Est African American: 62 mL/min/{1.73_m2} (ref >=60)
GFR, Est Non African American: 53 mL/min/{1.73_m2} — ABNORMAL LOW (ref >=60)
Globulin: 3.1 g/dL (calc) (ref 1.9–3.7)
Glucose, Bld: 99 mg/dL (ref 65–139)
Potassium: 3.8 mmol/L (ref 3.5–5.3)
Sodium: 144 mmol/L (ref 135–146)
Total Bilirubin: 0.4 mg/dL (ref 0.2–1.2)
Total Protein: 7.4 g/dL (ref 6.1–8.1)

## 2018-04-03 LAB — LIPID PANEL
Cholesterol: 141 mg/dL (ref <200)
HDL: 54 mg/dL (ref >50)
LDL Cholesterol (Calc): 73 mg/dL (calc)
Non-HDL Cholesterol (Calc): 87 mg/dL (calc) (ref <130)
Total CHOL/HDL Ratio: 2.6 (calc) (ref <5.0)
Triglycerides: 55 mg/dL (ref <150)

## 2018-04-03 MED ORDER — LINACLOTIDE 72 MCG PO CAPS: 72.0000 ug | ORAL_CAPSULE | Freq: Every day | ORAL | 0 refills | Status: DC

## 2018-04-03 MED ORDER — AMLODIPINE BESYLATE 2.5 MG PO TABS: 2.5000 mg | ORAL_TABLET | Freq: Every evening | ORAL | 0 refills | Status: DC

## 2018-04-03 MED ORDER — ROSUVASTATIN CALCIUM 5 MG PO TABS: 5.0000 mg | ORAL_TABLET | Freq: Every day | ORAL | 0 refills | Status: DC

## 2018-04-03 MED ORDER — HYDROCHLOROTHIAZIDE 25 MG PO TABS: 25.0000 mg | ORAL_TABLET | Freq: Every day | ORAL | 0 refills | Status: DC

## 2018-04-03 NOTE — Progress Notes (Signed)
Name: Terri Werner   MRN: [id number]    DOB: 07/16/1950   Date:04/03/2018       Progress Note  Subjective  Chief Complaint  Chief Complaint  Patient presents with  . Follow-up    1 month f/u  . Hypertension  . Back Pain  . Obesity  . Constipation  . Hyperlipidemia  . Depression    HPI  HTN: used to be uncontrolled in the past, but states recently has been around 130,  last visit her bp was high and we resumed Norvasc 2.5 mg and she is doing well at this time, no chest pain, palpitation, dizziness.  Chronic low back pain: she used to see Dr. Cherylann Ratel, also seen by neurosurgeon in the past and Dr. Council Mechanic. She states pain is daily around 4-5/10. She is now on Tylenol daily   Morbid obesity: she started to gain weight around 2011 when taking care of husband ( died of cancer) after that also took care of her sister, she has a stressful job and skips meals. Since last visit one month ago she has started eating breakfast - protein shake or yogurt.   Chronic constipation: she states always had problems, but over the past year seems to be a little worse. She takes miralax and otc laxatives to have a bowel movement. Bristol scale usually 3, 4 with laxatives  Amitiza too expensive and could not afford, she may have a high deductible plan, we will try linzess and monitor   Hyperlipidemia: taking zetia, tried pravastatin but caused muscle pain, she has carotid atherosclerosis, LDL was above 140, since last visit she has been taking Zetia and Rosuvastatin and is tolerating it well. We will recheck labs.   Situational depression: daughter is divorced has 3 children and they had to move near her, she has been supporting her financially, also has a stressful job. 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  She has been doing better, trying to establish boundaries.     Patient Active Problem List   Diagnosis Date Noted  . Unspecified inflammatory  spondylopathy, lumbar region (HCC) 03/05/2018  . Lumbar spondylolysis 08/20/2017  . Lumbar facet arthropathy 08/20/2017  . Lumbar degenerative disc disease 08/20/2017  . Chronic constipation 07/30/2017  . Primary osteoarthritis involving multiple joints 04/17/2017  . Postmenopausal 10/12/2016  . Prediabetes 10/12/2016  . Lumbago 08/04/2015  . Diverticulitis of colon 07/05/2014  . Carotid artery disorder (HCC) 04/27/2014  . Hyperlipidemia 10/08/2013  . Arthralgia 06/22/2011  . Hypertension, benign 11/19/2009    Past Surgical History:  Procedure Laterality Date  . ABDOMINAL HYSTERECTOMY  2006  . APPENDECTOMY  1980  . CESAREAN SECTION  1981  . COLONOSCOPY WITH PROPOFOL N/A 11/01/2014   Procedure: COLONOSCOPY WITH PROPOFOL;  Surgeon: Scot Jun, MD;  Location: [facility] ENDOSCOPY;  Service: Endoscopy;  Laterality: N/A;  . ECTOPIC PREGNANCY SURGERY  1980  . HERNIA REPAIR  2006   Umbilical  . KNEE ARTHROSCOPY Left 2006   torn meniscus  . TONSILLECTOMY  1971  . TOTAL ABDOMINAL HYSTERECTOMY W/ BILATERAL SALPINGOOPHORECTOMY  2006  . VESICOVAGINAL FISTULA CLOSURE W/ TAH      Family History  Problem Relation Age of Onset  . Breast cancer Sister 40  . Hypertension Mother   . Alzheimer's disease Mother   . Dementia Mother   . Hypertension Maternal Grandfather   . Hyperlipidemia Maternal Grandfather   . Birth defects Maternal Grandmother        Bladder  .  Colon cancer Maternal Grandmother   . Congestive Heart Failure Father   . Hyperlipidemia Father   . Dementia Paternal Grandmother   . Dementia Paternal Grandfather   . Thyroid disease Sister     Social History   Socioeconomic History  . Marital status: Widowed    Spouse name: Not on file  . Number of children: 2  . Years of education: Not on file  . Highest education level: Master's degree (e.g., MA, MS, MEng, MEd, MSW, MBA)  Occupational History  . Occupation: [employer] [place]    Comment: Full Time     Employer: [employer]  Social Needs  . Financial resource strain: Hard  . Food insecurity:    Worry: Never true    Inability: Never true  . Transportation needs:    Medical: No    Non-medical: No  Tobacco Use  . Smoking status: Never Smoker  . Smokeless tobacco: Never Used  Substance and Sexual Activity  . Alcohol use: Yes    Comment: Occasional  . Drug use: No  . Sexual activity: Not Currently    Partners: Male  Lifestyle  . Physical activity:    Days per week: 3 days    Minutes per session: 20 min  . Stress: Not at all  Relationships  . Social connections:    Talks on phone: More than three times a week    Gets together: Twice a week    Attends religious service: More than 4 times per year    Active member of club or organization: Yes    Attends meetings of clubs or organizations: More than 4 times per year    Relationship status: Widowed  . Intimate partner violence:    Fear of current or ex partner: No    Emotionally abused: No    Physically abused: No    Forced sexual activity: No  Other Topics Concern  . Not on file  Social History Narrative   Works for [employer] and helps her 3 grandchildren she helps with since her daughter just recently became employed again after surgery.      Regular Exercise -  NO   Daily Caffeine Use:  1 soda/tea            Current Outpatient Medications:  .  amLODipine (NORVASC) 2.5 MG tablet, Take 1 tablet (2.5 mg total) by mouth daily., Disp: 90 tablet, Rfl: 0 .  aspirin 81 MG tablet, Take 81 mg by mouth daily., Disp: , Rfl:  .  Cholecalciferol (VITAMIN D) 2000 UNITS CAPS, Take 2,000 Units by mouth daily., Disp: , Rfl:  .  diclofenac sodium (VOLTAREN) 1 % GEL, Apply 2 g topically 4 (four) times daily. Uses a couple time a week, Disp: 100 g, Rfl: 2 .  ezetimibe (ZETIA) 10 MG tablet, Take 10 mg by mouth daily., Disp: , Rfl: 0 .  fluticasone (FLONASE) 50 MCG/ACT nasal spray, Place 2 sprays into both nostrils  daily. (Patient taking differently: Place 2 sprays into both nostrils as needed. ), Disp: 16 g, Rfl: 6 .  hydrochlorothiazide (HYDRODIURIL) 25 MG tablet, Take 1 tablet (25 mg total) by mouth daily., Disp: 90 tablet, Rfl: 0 .  lidocaine (LIDODERM) 5 %, Place 1 patch onto the skin daily. Remove & Discard patch within 12 hours., Disp: 30 patch, Rfl: 0 .  linaclotide (LINZESS) 72 MCG capsule, Take 1 capsule (72 mcg total) by mouth daily before breakfast., Disp: 90 capsule, Rfl: 0 .  rosuvastatin (CRESTOR) 5 MG tablet, TAKE 1 TABLET BY MOUTH EVERY DAY. THIS IN PLACE OF ZETIA TO SEE IF YOU CAN TOLERATE, WITH COQ10, Disp: 30 tablet, Rfl: 0  Allergies  Allergen Reactions  . Latex Itching    Burning  . Lisinopril Other (See Comments)    cough  . Pravastatin     Muscle ache   . Shrimp [Shellfish Allergy] Swelling    Lips will burn     I personally reviewed active problem list, medication list, allergies, family history, social history with the patient/caregiver today.   ROS  Constitutional: Negative for fever or weight change.  Respiratory: Negative for cough and shortness of breath.   Cardiovascular: Negative for chest pain or palpitations.  Gastrointestinal: Negative for abdominal pain, no bowel changes.  Musculoskeletal: Negative for gait problem or joint swelling.  Skin: Negative for rash.  Neurological: Negative for dizziness or headache.  No other specific complaints in a complete review of systems (except as listed in HPI above).  Objective  Vitals:   04/03/18 1045  BP: 128/82  Pulse: 73  Resp: 16  Temp: 97.8 F (36.6 C)  TempSrc: Oral  SpO2: 97%  Weight: 230 lb 6.4 oz (104.5 kg)  Height: 5\' 2"  (1.575 m)    Body mass index is 42.14 kg/m.  Physical Exam  Constitutional: Patient appears well-developed and well-nourished. Obese No distress.  HEENT: head atraumatic, normocephalic, pupils equal and reactive to light, neck supple, throat within normal  limits Cardiovascular: Normal rate, regular rhythm and normal heart sounds.  No murmur heard. No BLE edema. Pulmonary/Chest: Effort normal and breath sounds normal. No respiratory distress. Abdominal: Soft.  There is no tenderness. Psychiatric: Patient has a normal mood and affect. behavior is normal. Judgment and thought content normal.  PHQ2/9: Depression screen [facility] 2/9 04/03/2018 03/05/2018 08/20/2017 05/30/2017 04/24/2017  Decreased Interest 0 0 0 0 0  Down, Depressed, Hopeless 1 2 0 0 0  PHQ - 2 Score 1 2 0 0 0  Altered sleeping 0 0 - - -  Tired, decreased energy 1 0 - - -  Change in appetite 0 0 - - -  Feeling bad or failure about yourself  0 0 - - -  Trouble concentrating 0 0 - - -  Moving slowly or fidgety/restless 0 0 - - -  Suicidal thoughts 0 0 - - -  PHQ-9 Score 2 2 - - -  Difficult doing work/chores Not difficult at all Not difficult at all - - -     Fall Risk: Fall Risk  04/03/2018 03/05/2018 08/20/2017 05/30/2017 04/24/2017  Falls in the past year? 0 No No No No  Number falls in past yr: 0 - - - -  Injury with Fall? 0 - - - -  Risk for fall due to : - - - - -  Risk for fall due to: Comment - - - - -  Follow up - - - - -  Comment - - - - -     Functional Status Survey: Is the patient deaf or have difficulty hearing?: No Does the patient have difficulty seeing, even when wearing glasses/contacts?: No(patient wears glasses) Does the patient have difficulty concentrating, remembering, or making decisions?: No Does the patient have difficulty walking or climbing stairs?: No Does the patient have difficulty dressing or bathing?: No Does the patient have difficulty doing errands alone such as visiting a doctor's office or shopping?: No   Assessment & Plan  1. Hypertension, benign  She states norvasc seems to make her feel tired, advised to take it at night   2. Pure hypercholesterolemia  - Lipid panel  3. Morbid obesity (HCC)  Discussed with the patient the  risk posed by an increased BMI. Discussed importance of portion control, calorie counting and at least 150 minutes of physical activity weekly. Avoid sweet beverages and drink more water. Eat at least 6 servings of fruit and vegetables daily   4. Chronic constipation  - linaclotide (LINZESS) 72 MCG capsule; Take 1 capsule (72 mcg total) by mouth daily before breakfast.  Dispense: 90 capsule; Refill: 0  5. Long-term use of high-risk medication  - COMPLETE METABOLIC PANEL WITH GFR  6. Situational depression  Stable

## 2018-04-14 ENCOUNTER — Ambulatory Visit
Admission: RE | Admit: 2018-04-14 | Discharge: 2018-04-14 | Disposition: A | Discharge status: Home / Self Care | Payer: Managed Care, Other (non HMO) | Source: Ambulatory Visit | Attending: Family Medicine | Admitting: Family Medicine

## 2018-04-14 DIAGNOSIS — E2839 Other primary ovarian failure: Secondary | ICD-10-CM | POA: Insufficient documentation

## 2018-04-14 DIAGNOSIS — Z13820 Encounter for screening for osteoporosis: Secondary | ICD-10-CM | POA: Insufficient documentation

## 2018-08-01 ENCOUNTER — Other Ambulatory Visit: Payer: Self-pay | Admitting: Family Medicine

## 2018-08-01 DIAGNOSIS — I10 Essential (primary) hypertension: Secondary | ICD-10-CM

## 2018-08-01 DIAGNOSIS — E7800 Pure hypercholesterolemia, unspecified: Secondary | ICD-10-CM

## 2018-08-01 NOTE — Telephone Encounter (Signed)
Hypertension medication request: Amlodipine and HCTZ  Last office visit pertaining to hypertension: 04/03/2018    Refill Request for Cholesterol medication. Crestor   Lab Results  Component Value Date   CHOL 141 04/03/2018   HDL 54 04/03/2018   LDLCALC 73 04/03/2018   TRIG 55 04/03/2018   CHOLHDL 2.6 04/03/2018    No follow-ups on file.  BP Readings from Last 3 Encounters:  04/03/18 128/82  03/05/18 (!) 154/86  02/14/18 134/84    Lab Results  Component Value Date   CREATININE 1.08 (H) 04/03/2018   BUN 21 04/03/2018   NA 144 04/03/2018   K 3.8 04/03/2018   CL 105 04/03/2018   CO2 30 04/03/2018    Follow up on 09/24/2018

## 2018-08-04 ENCOUNTER — Ambulatory Visit: Payer: Managed Care, Other (non HMO) | Admitting: Family Medicine

## 2018-09-24 ENCOUNTER — Encounter: Payer: Self-pay | Admitting: Family Medicine

## 2018-09-24 ENCOUNTER — Ambulatory Visit (INDEPENDENT_AMBULATORY_CARE_PROVIDER_SITE_OTHER): Payer: 59 | Admitting: Family Medicine

## 2018-09-24 ENCOUNTER — Other Ambulatory Visit: Payer: Self-pay

## 2018-09-24 DIAGNOSIS — Z566 Other physical and mental strain related to work: Secondary | ICD-10-CM | POA: Diagnosis not present

## 2018-09-24 DIAGNOSIS — I779 Disorder of arteries and arterioles, unspecified: Secondary | ICD-10-CM

## 2018-09-24 DIAGNOSIS — E7800 Pure hypercholesterolemia, unspecified: Secondary | ICD-10-CM

## 2018-09-24 DIAGNOSIS — K5909 Other constipation: Secondary | ICD-10-CM

## 2018-09-24 DIAGNOSIS — I739 Peripheral vascular disease, unspecified: Secondary | ICD-10-CM

## 2018-09-24 DIAGNOSIS — I10 Essential (primary) hypertension: Secondary | ICD-10-CM | POA: Diagnosis not present

## 2018-09-24 DIAGNOSIS — M4696 Unspecified inflammatory spondylopathy, lumbar region: Secondary | ICD-10-CM

## 2018-09-24 MED ORDER — ROSUVASTATIN CALCIUM 5 MG PO TABS: ORAL_TABLET | ORAL | 1 refills | Status: DC

## 2018-09-24 MED ORDER — DICLOFENAC SODIUM 1 % TD GEL: 2.0000 g | Freq: Four times a day (QID) | TRANSDERMAL | 2 refills | Status: DC

## 2018-09-24 MED ORDER — AMLODIPINE BESYLATE 2.5 MG PO TABS: ORAL_TABLET | ORAL | 1 refills | Status: DC

## 2018-09-24 MED ORDER — LIDOCAINE 5 % EX PTCH: 3.0000 | MEDICATED_PATCH | Freq: Two times a day (BID) | CUTANEOUS | 2 refills | Status: DC | PRN

## 2018-09-24 MED ORDER — HYDROCHLOROTHIAZIDE 25 MG PO TABS: ORAL_TABLET | ORAL | 1 refills | Status: DC

## 2018-09-24 NOTE — Progress Notes (Signed)
Name: Terri Werner   MRN: [id number]    DOB: Apr 13, 1951   Date:09/24/2018       Progress Note  Subjective  Chief Complaint  Chief Complaint  Patient presents with  . Hypertension  . Hyperlipidemia  . Dyslipidemia    I connected with  Terri Werner  on 09/24/18 at  7:40 AM EDT by a video enabled telemedicine application and verified that I am speaking with the correct person using two identifiers.  I discussed the limitations of evaluation and management by telemedicine and the availability of in person appointments. The patient expressed understanding and agreed to proceed. Staff also discussed with the patient that there may be a patient responsible charge related to this service. Patient Location: at home  Provider Location: [facility]   HPI  HTN: she has been on  Norvasc 2.5 mg since Fall of 2019 and bp at home has been at goal  .She denies chest pain, palpitation, dizziness.  Chronic low back pain: she used to see Dr. Cherylann Werner, also seen by neurosurgeon Terri Werner  in the past and Dr. Council Werner. She states pain is daily around 9/10 now. She states sitting much more now with televisits and pain has been worse. She failed neurontin and lyrica, using topical medication . She is now on Tylenol daily . She would like a refill of medication   Morbid obesity: she started to gain weight around 2011 when taking care of husband ( died of cancer) after that also took care of her sister, she has a stressful job and skips meals. She is also working from home and sitting more often   Chronic constipation: she states always had problems, she has been taking Linzess prn only secondary to cost, she changed insurance and will find out if another type is cheaper. Has a bowel movement every 3rd day no straining or blood in stools  Hyperlipidemia:  tried pravastatin but caused muscle pain, she has carotid atherosclerosis and tortuous ICA, LDL was around 73,  she is off Zetia, but still taking Crestor daily and tolerating it well.   Stress: she is not longer supporting her daughter and grandchildren, but very stressed about her job security since the merger with [employer]     Patient Active Problem List   Diagnosis Date Noted  . Morbid obesity (HCC) 09/24/2018  . Unspecified inflammatory spondylopathy, lumbar region (HCC) 03/05/2018  . Lumbar spondylolysis 08/20/2017  . Lumbar facet arthropathy 08/20/2017  . Lumbar degenerative disc disease 08/20/2017  . Chronic constipation 07/30/2017  . Primary osteoarthritis involving multiple joints 04/17/2017  . Postmenopausal 10/12/2016  . Prediabetes 10/12/2016  . Lumbago 08/04/2015  . Diverticulitis of colon 07/05/2014  . Carotid artery disorder (HCC) 04/27/2014  . Hyperlipidemia 10/08/2013  . Arthralgia 06/22/2011  . Hypertension, benign 11/19/2009    Past Surgical History:  Procedure Laterality Date  . ABDOMINAL HYSTERECTOMY  2006  . APPENDECTOMY  1980  . CESAREAN SECTION  1981  . COLONOSCOPY WITH PROPOFOL N/A 11/01/2014   Procedure: COLONOSCOPY WITH PROPOFOL;  Surgeon: Scot Jun, MD;  Location: [facility] ENDOSCOPY;  Service: Endoscopy;  Laterality: N/A;  . ECTOPIC PREGNANCY SURGERY  1980  . HERNIA REPAIR  2006   Umbilical  . KNEE ARTHROSCOPY Left 2006   torn meniscus  . TONSILLECTOMY  1971  . TOTAL ABDOMINAL HYSTERECTOMY W/ BILATERAL SALPINGOOPHORECTOMY  2006  . VESICOVAGINAL FISTULA CLOSURE W/ TAH      Family History  Problem Relation Age of Onset  .  Breast cancer Sister 7  . Hypertension Mother   . Alzheimer's disease Mother   . Dementia Mother   . Hypertension Maternal Grandfather   . Hyperlipidemia Maternal Grandfather   . Birth defects Maternal Grandmother        Bladder  . Colon cancer Maternal Grandmother   . Congestive Heart Failure Father   . Hyperlipidemia Father   . Dementia Paternal Grandmother   . Dementia Paternal Grandfather   . Thyroid  disease Sister     Social History   Socioeconomic History  . Marital status: Widowed    Spouse name: Not on file  . Number of children: 2  . Years of education: Not on file  . Highest education level: Master's degree (e.g., MA, MS, MEng, MEd, MSW, MBA)  Occupational History  . Occupation: [employer] [place]    Comment: Full Time    Employer: [employer]  Social Needs  . Financial resource strain: Hard  . Food insecurity:    Worry: Never true    Inability: Never true  . Transportation needs:    Medical: No    Non-medical: No  Tobacco Use  . Smoking status: Never Smoker  . Smokeless tobacco: Never Used  Substance and Sexual Activity  . Alcohol use: Yes    Comment: Occasional  . Drug use: No  . Sexual activity: Not Currently    Partners: Male  Lifestyle  . Physical activity:    Days per week: 3 days    Minutes per session: 20 min  . Stress: Not at all  Relationships  . Social connections:    Talks on phone: More than three times a week    Gets together: Twice a week    Attends religious service: More than 4 times per year    Active member of club or organization: Yes    Attends meetings of clubs or organizations: More than 4 times per year    Relationship status: Widowed  . Intimate partner violence:    Fear of current or ex partner: No    Emotionally abused: No    Physically abused: No    Forced sexual activity: No  Other Topics Concern  . Not on file  Social History Narrative   Works for [employer] and helps her 3 grandchildren she helps with since her daughter just recently became employed again after surgery.      Regular Exercise -  NO   Daily Caffeine Use:  1 soda/tea            Current Outpatient Medications:  .  aspirin 81 MG tablet, Take 81 mg by mouth daily., Disp: , Rfl:  .  Cholecalciferol (VITAMIN D) 2000 UNITS CAPS, Take 2,000 Units by mouth daily., Disp: , Rfl:  .  diclofenac sodium (VOLTAREN) 1 % GEL, Apply  2 g topically 4 (four) times daily. Uses a couple time a week, Disp: 100 g, Rfl: 2 .  fluticasone (FLONASE) 50 MCG/ACT nasal spray, Place 2 sprays into both nostrils daily. (Patient taking differently: Place 2 sprays into both nostrils as needed. ), Disp: 16 g, Rfl: 6 .  linaclotide (LINZESS) 72 MCG capsule, Take 1 capsule (72 mcg total) by mouth daily before breakfast., Disp: 90 capsule, Rfl: 0 .  amLODipine (NORVASC) 2.5 MG tablet, TAKE 1 TABLET(2.5 MG) BY MOUTH EVERY EVENING, Disp: 90 tablet, Rfl: 1 .  hydrochlorothiazide (HYDRODIURIL) 25 MG tablet, TAKE 1 TABLET(25 MG) BY MOUTH DAILY, Disp: 90 tablet,  Rfl: 1 .  lidocaine (LIDODERM) 5 %, Place 3 patches onto the skin every 12 (twelve) hours as needed. Remove & Discard patch within 12 hours, Disp: 90 patch, Rfl: 2 .  rosuvastatin (CRESTOR) 5 MG tablet, TAKE 1 TABLET(5 MG) BY MOUTH DAILY, Disp: 90 tablet, Rfl: 1  Allergies  Allergen Reactions  . Latex Itching    Burning  . Lisinopril Other (See Comments)    cough  . Pravastatin     Muscle ache   . Shrimp [Shellfish Allergy] Swelling    Lips will burn     I personally reviewed active problem list, medication list, allergies, family history, social history with the patient/caregiver today.   ROS  Ten systems reviewed and is negative except as mentioned in HPI   Objective  Virtual encounter, vitals not obtained.  Body mass index is 42.4 kg/m.  Physical Exam  Awake, alert and oriented    PHQ2/9:  PHQ-2/9 Result is negative.    Fall Risk: Fall Risk  09/24/2018 04/03/2018 03/05/2018 08/20/2017 05/30/2017  Falls in the past year? 0 0 No No No  Number falls in past yr: 0 0 - - -  Injury with Fall? 0 0 - - -  Risk for fall due to : - - - - -  Risk for fall due to: Comment - - - - -  Follow up - - - - -  Comment - - - - -    Depression screen [facility] 2/9 09/24/2018 04/03/2018 03/05/2018 08/20/2017 05/30/2017  Decreased Interest 0 0 0 0 0  Down, Depressed, Hopeless 1 1 2  0 0  PHQ  - 2 Score 1 1 2  0 0  Altered sleeping 0 0 0 - -  Tired, decreased energy 3 1 0 - -  Change in appetite 0 0 0 - -  Feeling bad or failure about yourself  0 0 0 - -  Trouble concentrating 0 0 0 - -  Moving slowly or fidgety/restless 0 0 0 - -  Suicidal thoughts 0 0 0 - -  PHQ-9 Score 4 2 2  - -  Difficult doing work/chores Somewhat difficult Not difficult at all Not difficult at all - -     Assessment & Plan  1. Hypertension, benign  At goal  - amLODipine (NORVASC) 2.5 MG tablet; TAKE 1 TABLET(2.5 MG) BY MOUTH EVERY EVENING  Dispense: 90 tablet; Refill: 0 - hydrochlorothiazide (HYDRODIURIL) 25 MG tablet; TAKE 1 TABLET(25 MG) BY MOUTH DAILY  Dispense: 90 tablet; Refill: 0  2. Pure hypercholesterolemia  - rosuvastatin (CRESTOR) 5 MG tablet; TAKE 1 TABLET(5 MG) BY MOUTH DAILY  Dispense: 90 tablet; Refill: 0  3. Unspecified inflammatory spondylopathy, lumbar region [facility])  Used to see Dr. Cherylann Werner, pain clinic but stopped going because of cost. Last visit was Nov 2019  4. Morbid obesity (HCC)  Discussed with the patient the risk posed by an increased BMI. Discussed importance of portion control, calorie counting and at least 150 minutes of physical activity weekly. Avoid sweet beverages and drink more water. Eat at least 6 servings of fruit and vegetables daily   5. Carotid artery disorder (HCC)  On MRA of neck done in 201  6. Chronic constipation  Taking Linzess prn because of cost, discussed finding out which one of the class is cheaper ( Trulance or Amitiza)   7. Stress at work  Since [employer] she has been overworked and worried about losing her job  I discussed the assessment and treatment plan  with the patient. The patient was provided an opportunity to ask questions and all were answered. The patient agreed with the plan and demonstrated an understanding of the instructions.  The patient was advised to call back or seek an in-person evaluation if the symptoms worsen  or if the condition fails to improve as anticipated.  I provided 25  minutes of non-face-to-face time during this encounter.

## 2018-09-24 NOTE — Patient Instructions (Signed)
Check on Amitiza and Trulance cost

## 2019-01-08 DIAGNOSIS — Z20828 Contact with and (suspected) exposure to other viral communicable diseases: Secondary | ICD-10-CM | POA: Diagnosis not present

## 2019-01-09 DIAGNOSIS — Z20828 Contact with and (suspected) exposure to other viral communicable diseases: Secondary | ICD-10-CM | POA: Diagnosis not present

## 2019-01-13 ENCOUNTER — Encounter: Payer: Self-pay | Admitting: Family Medicine

## 2019-01-13 ENCOUNTER — Other Ambulatory Visit: Payer: Self-pay

## 2019-01-13 ENCOUNTER — Ambulatory Visit (INDEPENDENT_AMBULATORY_CARE_PROVIDER_SITE_OTHER): Payer: 59 | Admitting: Family Medicine

## 2019-01-13 DIAGNOSIS — Z131 Encounter for screening for diabetes mellitus: Secondary | ICD-10-CM | POA: Diagnosis not present

## 2019-01-13 DIAGNOSIS — I10 Essential (primary) hypertension: Secondary | ICD-10-CM

## 2019-01-13 DIAGNOSIS — E559 Vitamin D deficiency, unspecified: Secondary | ICD-10-CM | POA: Diagnosis not present

## 2019-01-13 DIAGNOSIS — Z23 Encounter for immunization: Secondary | ICD-10-CM | POA: Diagnosis not present

## 2019-01-13 DIAGNOSIS — Z1239 Encounter for other screening for malignant neoplasm of breast: Secondary | ICD-10-CM | POA: Diagnosis not present

## 2019-01-13 DIAGNOSIS — E7800 Pure hypercholesterolemia, unspecified: Secondary | ICD-10-CM

## 2019-01-13 DIAGNOSIS — R7303 Prediabetes: Secondary | ICD-10-CM

## 2019-01-13 DIAGNOSIS — Z0000 Encounter for general adult medical examination without abnormal findings: Secondary | ICD-10-CM | POA: Diagnosis not present

## 2019-01-13 DIAGNOSIS — J329 Chronic sinusitis, unspecified: Secondary | ICD-10-CM | POA: Diagnosis not present

## 2019-01-13 DIAGNOSIS — J302 Other seasonal allergic rhinitis: Secondary | ICD-10-CM | POA: Diagnosis not present

## 2019-01-13 DIAGNOSIS — B9789 Other viral agents as the cause of diseases classified elsewhere: Secondary | ICD-10-CM

## 2019-01-13 MED ORDER — VALSARTAN-HYDROCHLOROTHIAZIDE 160-25 MG PO TABS: 1.0000 | ORAL_TABLET | Freq: Every day | ORAL | 1 refills | Status: DC

## 2019-01-13 MED ORDER — FLUTICASONE PROPIONATE 50 MCG/ACT NA SUSP: 2.0000 | Freq: Every day | NASAL | 1 refills | Status: DC

## 2019-01-13 MED ORDER — RYBELSUS 7 MG PO TABS: 1.0000 | ORAL_TABLET | ORAL | 2 refills | Status: DC

## 2019-01-13 MED ORDER — AMLODIPINE BESYLATE 2.5 MG PO TABS: ORAL_TABLET | ORAL | 1 refills | Status: DC

## 2019-01-13 MED ORDER — ROSUVASTATIN CALCIUM 5 MG PO TABS: ORAL_TABLET | ORAL | 1 refills | Status: DC

## 2019-01-13 NOTE — Patient Instructions (Addendum)
Ask insurance if they pay for Shingrix    Preventive Care 65 Years and Older, Female Preventive care refers to lifestyle choices and visits with your health care provider that can promote health and wellness. This includes:  A yearly physical exam. This is also called an annual well check.  Regular dental and eye exams.  Immunizations.  Screening for certain conditions.  Healthy lifestyle choices, such as diet and exercise. What can I expect for my preventive care visit? Physical exam Your health care provider will check:  Height and weight. These may be used to calculate body mass index (BMI), which is a measurement that tells if you are at a healthy weight.  Heart rate and blood pressure.  Your skin for abnormal spots. Counseling Your health care provider may ask you questions about:  Alcohol, tobacco, and drug use.  Emotional well-being.  Home and relationship well-being.  Sexual activity.  Eating habits.  History of falls.  Memory and ability to understand (cognition).  Work and work Statistician.  Pregnancy and menstrual history. What immunizations do I need?  Influenza (flu) vaccine  This is recommended every year. Tetanus, diphtheria, and pertussis (Tdap) vaccine  You may need a Td booster every 10 years. Varicella (chickenpox) vaccine  You may need this vaccine if you have not already been vaccinated. Zoster (shingles) vaccine  You may need this after age 73. Pneumococcal conjugate (PCV13) vaccine  One dose is recommended after age 11. Pneumococcal polysaccharide (PPSV23) vaccine  One dose is recommended after age 45. Measles, mumps, and rubella (MMR) vaccine  You may need at least one dose of MMR if you were born in 1957 or later. You may also need a second dose. Meningococcal conjugate (MenACWY) vaccine  You may need this if you have certain conditions. Hepatitis A vaccine  You may need this if you have certain conditions or if you  travel or work in places where you may be exposed to hepatitis A. Hepatitis B vaccine  You may need this if you have certain conditions or if you travel or work in places where you may be exposed to hepatitis B. Haemophilus influenzae type b (Hib) vaccine  You may need this if you have certain conditions. You may receive vaccines as individual doses or as more than one vaccine together in one shot (combination vaccines). Talk with your health care provider about the risks and benefits of combination vaccines. What tests do I need? Blood tests  Lipid and cholesterol levels. These may be checked every 5 years, or more frequently depending on your overall health.  Hepatitis C test.  Hepatitis B test. Screening  Lung cancer screening. You may have this screening every year starting at age 40 if you have a 30-pack-year history of smoking and currently smoke or have quit within the past 15 years.  Colorectal cancer screening. All adults should have this screening starting at age 77 and continuing until age 34. Your health care provider may recommend screening at age 57 if you are at increased risk. You will have tests every 1-10 years, depending on your results and the type of screening test.  Diabetes screening. This is done by checking your blood sugar (glucose) after you have not eaten for a while (fasting). You may have this done every 1-3 years.  Mammogram. This may be done every 1-2 years. Talk with your health care provider about how often you should have regular mammograms.  BRCA-related cancer screening. This may be done if you have  a family history of breast, ovarian, tubal, or peritoneal cancers. Other tests  Sexually transmitted disease (STD) testing.  Bone density scan. This is done to screen for osteoporosis. You may have this done starting at age 109. Follow these instructions at home: Eating and drinking  Eat a diet that includes fresh fruits and vegetables, whole grains,  lean protein, and low-fat dairy products. Limit your intake of foods with high amounts of sugar, saturated fats, and salt.  Take vitamin and mineral supplements as recommended by your health care provider.  Do not drink alcohol if your health care provider tells you not to drink.  If you drink alcohol: ? Limit how much you have to 0-1 drink a day. ? Be aware of how much alcohol is in your drink. In the [place] one drink equals one 12 oz bottle of beer (355 mL), one 5 oz glass of wine (148 mL), or one 1 oz glass of hard liquor (44 mL). Lifestyle  Take daily care of your teeth and gums.  Stay active. Exercise for at least 30 minutes on 5 or more days each week.  Do not use any products that contain nicotine or tobacco, such as cigarettes, e-cigarettes, and chewing tobacco. If you need help quitting, ask your health care provider.  If you are sexually active, practice safe sex. Use a condom or other form of protection in order to prevent STIs (sexually transmitted infections).  Talk with your health care provider about taking a low-dose aspirin or statin. What's next?  Go to your health care provider once a year for a well check visit.  Ask your health care provider how often you should have your eyes and teeth checked.  Stay up to date on all vaccines. This information is not intended to replace advice given to you by your health care provider. Make sure you discuss any questions you have with your health care provider. Document Released: 05/27/2015 Document Revised: 04/24/2018 Document Reviewed: 04/24/2018 Elsevier Patient Education  2020 [employer].

## 2019-01-13 NOTE — Progress Notes (Signed)
Name: Terri Werner   MRN: [id number]    DOB: [birth year]   Date:01/13/2019       Progress Note  Subjective  Chief Complaint  Chief Complaint  Patient presents with  . Annual Exam    HPI  Patient presents for annual CPE and follow up  HTN: she has been on HCTZ and we added   Norvasc 2.5 mg since Fall of 2019 bp still elevated we will try adding ARB, she had a cough with ACE, and monitor   .She denies chest pain, palpitation, dizziness.  Chronic low back pain: she used to see Dr. Cherylann Ratel, also seen by neurosurgeon Marcell Barlow  in the past and Dr. Council Mechanic. She states pain is daily around6-7/10 now. She states sitting much more now with televisits and pain has been worse. She failed neurontin and lyrica, using topical medications and otc . Sheis now on Tylenol daily  Morbid obesity/prediabetes: she started to gain weight around 2011 when taking care of husband ( died of cancer) after that also took care of her sister, she has a stressful job and skips meals.She is also working from home and sitting more often , not physically active, she has pre-diabetes previous A1C done at [facility] was up to 6.4%, discussed healthy diet and she is willing to try Rylbesus   Chronic constipation: she states always had problems, she has been taking Linzess prn only secondary to cost, she changed insurance and will find out if another type is cheaper. Has a bowel movement every 3rd day no straining or blood in stools. Unchanged   Hyperlipidemia:  tried pravastatin but caused muscle pain, she has carotid atherosclerosis and tortuous ICA,LDL was around 73, she is off Zetia, but still taking Crestor daily and tolerating it well. 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    Diet: skipping meals Exercise:   USPSTF grade A and B  recommendations    Office Visit from 04/03/2018 in [facility]  AUDIT-C Score  0     Depression: Phq 9 is  positive Depression screen [facility] 2/9 01/13/2019 09/24/2018 04/03/2018 03/05/2018 08/20/2017  Decreased Interest 0 0 0 0 0  Down, Depressed, Hopeless 1 1 1 2  0  PHQ - 2 Score 1 1 1 2  0  Altered sleeping 0 0 0 0 -  Tired, decreased energy 3 3 1  0 -  Change in appetite 0 0 0 0 -  Feeling bad or failure about yourself  0 0 0 0 -  Trouble concentrating 0 0 0 0 -  Moving slowly or fidgety/restless 0 0 0 0 -  Suicidal thoughts 0 0 0 0 -  PHQ-9 Score 4 4 2 2  -  Difficult doing work/chores Not difficult at all Somewhat difficult Not difficult at all Not difficult at all -   Hypertension: BP Readings from Last 3 Encounters:  01/13/19 (!) 140/94  09/24/18 120/70  04/03/18 128/82   Obesity: Wt Readings from Last 3 Encounters:  01/13/19 236 lb 1.6 oz (107.1 kg)  09/24/18 231 lb 12.8 oz (105.1 kg)  04/03/18 230 lb 6.4 oz (104.5 kg)   BMI Readings from Last 3 Encounters:  01/13/19 44.61 kg/m  09/24/18 42.40 kg/m  04/03/18 42.14 kg/m     Hep C Screening: 2017  STD testing and prevention (HIV/chl/gon/syphilis): N/A Intimate partner violence:negative screen  Sexual History/Pain during Intercourse:not sexually active since she became a widow in 2001 Menstrual History/LMP/Abnormal Bleeding: discussed post-menopausal bleeding or vaginal, rectal bleeding Incontinence Symptoms: no problems  Breast cancer:  - Last Mammogram: 11/2017 - BRCA gene screening: she had genetic test after her sister died at age 48 from breast cancer - through a study   Osteoporosis Screening: discussed bone density, high calcium and vitamin D diet  Cervical cancer screening: N/A  Skin cancer: discussed atypical lesions  Colorectal cancer: repeat 10/2019 Lung cancer:  Low Dose CT Chest recommended if Age 93-67 years, 30 pack-year currently smoking OR have quit w/in 15years. Patient does  not qualify.   ECG:2016   Advanced Care Planning: A voluntary discussion about advance care planning including the explanation and discussion of advance directives.  Discussed health care proxy and Living will, and the patient was able to identify a health care proxy as her daughter and sister Luster Landsberg.  Patient does have a living will at present time.   Lipids: Lab Results  Component Value Date   CHOL 141 04/03/2018   CHOL 187 04/15/2014   CHOL 221 (H) 09/07/2013   Lab Results  Component Value Date   HDL 54 04/03/2018   HDL 48.00 04/15/2014   HDL 59.20 09/07/2013   Lab Results  Component Value Date   LDLCALC 73 04/03/2018   LDLCALC 128 (H) 04/15/2014   LDLCALC 150 (H) 09/07/2013   Lab Results  Component Value Date   TRIG 55 04/03/2018   TRIG 57.0 04/15/2014   TRIG 60.0 09/07/2013   Lab Results  Component Value Date   CHOLHDL 2.6 04/03/2018   CHOLHDL 4 04/15/2014   CHOLHDL 4 09/07/2013   No results found for: LDLDIRECT  Glucose: Glucose  Date Value Ref Range Status  07/03/2012 77 65 - 99 mg/dL Final   Glucose, Bld  Date Value Ref Range Status  04/03/2018 99 65 - 139 mg/dL Final    Comment:    .        Non-fasting reference interval .   07/05/2014 100 (H) 70 - 99 mg/dL Final  16/02/9603 97 70 - 99 mg/dL Final    Patient Active Problem List   Diagnosis Date Noted  . Morbid obesity (HCC) 09/24/2018  . Unspecified inflammatory spondylopathy, lumbar region (HCC) 03/05/2018  . Lumbar spondylolysis 08/20/2017  . Lumbar facet arthropathy 08/20/2017  . Lumbar degenerative disc disease 08/20/2017  . Chronic constipation 07/30/2017  . Primary osteoarthritis involving multiple joints 04/17/2017  . Postmenopausal 10/12/2016  . Prediabetes 10/12/2016  . Lumbago 08/04/2015  . Diverticulitis of colon 07/05/2014  . Carotid artery disorder (HCC) 04/27/2014  . Hyperlipidemia 10/08/2013  . Arthralgia 06/22/2011  . Hypertension, benign 11/19/2009    Past Surgical  History:  Procedure Laterality Date  . ABDOMINAL HYSTERECTOMY  2006  . APPENDECTOMY  1980  . CESAREAN SECTION  1981  . COLONOSCOPY WITH PROPOFOL N/A 11/01/2014   Procedure: COLONOSCOPY WITH PROPOFOL;  Surgeon: Scot Jun, MD;  Location: [facility] ENDOSCOPY;  Service: Endoscopy;  Laterality: N/A;  . ECTOPIC PREGNANCY SURGERY  1980  . HERNIA REPAIR  2006   Umbilical  . KNEE ARTHROSCOPY Left 2006   torn meniscus  . TONSILLECTOMY  1971  . TOTAL ABDOMINAL HYSTERECTOMY W/ BILATERAL SALPINGOOPHORECTOMY  2006  . VESICOVAGINAL FISTULA CLOSURE W/ TAH      Family History  Problem Relation Age of Onset  . Breast cancer Sister 63  . Hypertension Mother   . Alzheimer's disease Mother   .  Dementia Mother   . Hypertension Maternal Grandfather   . Hyperlipidemia Maternal Grandfather   . Birth defects Maternal Grandmother        Bladder  . Colon cancer Maternal Grandmother   . Congestive Heart Failure Father   . Hyperlipidemia Father   . Dementia Paternal Grandmother   . Dementia Paternal Grandfather   . Thyroid disease Sister     Social History   Socioeconomic History  . Marital status: Widowed    Spouse name: Not on file  . Number of children: 2  . Years of education: Not on file  . Highest education level: Master's degree (e.g., MA, MS, MEng, MEd, MSW, MBA)  Occupational History  . Occupation: [employer]    Comment: Full Time    Employer: [employer]  Social Needs  . Financial resource strain: Hard  . Food insecurity    Worry: Never true    Inability: Never true  . Transportation needs    Medical: No    Non-medical: No  Tobacco Use  . Smoking status: Never Smoker  . Smokeless tobacco: Never Used  Substance and Sexual Activity  . Alcohol use: Yes    Comment: Occasional  . Drug use: No  . Sexual activity: Not Currently    Partners: Male  Lifestyle  . Physical activity    Days per week: 3 days    Minutes per session: 20 min   . Stress: Not at all  Relationships  . Social connections    Talks on phone: More than three times a week    Gets together: Twice a week    Attends religious service: More than 4 times per year    Active member of club or organization: Yes    Attends meetings of clubs or organizations: More than 4 times per year    Relationship status: Widowed  . Intimate partner violence    Fear of current or ex partner: No    Emotionally abused: No    Physically abused: No    Forced sexual activity: No  Other Topics Concern  . Not on file  Social History Narrative   Works for [employer] and helps her 3 grandchildren she helps with since her daughter just recently became employed again after surgery.      Regular Exercise -  NO   Daily Caffeine Use:  1 soda/tea            Current Outpatient Medications:  .  acetaminophen (TYLENOL) 500 MG tablet, Take 500 mg by mouth every 6 (six) hours as needed., Disp: , Rfl:  .  Coenzyme Q10 10 MG capsule, Take 10 mg by mouth daily., Disp: , Rfl:  .  amLODipine (NORVASC) 2.5 MG tablet, TAKE 1 TABLET(2.5 MG) BY MOUTH EVERY EVENING, Disp: 90 tablet, Rfl: 1 .  aspirin 81 MG tablet, Take 81 mg by mouth daily., Disp: , Rfl:  .  Cholecalciferol (VITAMIN D) 2000 UNITS CAPS, Take 2,000 Units by mouth daily., Disp: , Rfl:  .  diclofenac sodium (VOLTAREN) 1 % GEL, Apply 2 g topically 4 (four) times daily. Uses a couple time a week, Disp: 100 g, Rfl: 2 .  fluticasone (FLONASE) 50 MCG/ACT nasal spray, Place 2 sprays into both nostrils daily., Disp: 48 g, Rfl: 1 .  lidocaine (LIDODERM) 5 %, Place 3 patches onto the skin every 12 (twelve) hours as needed. Remove & Discard patch within 12 hours, Disp: 90 patch, Rfl: 2 .  linaclotide (LINZESS) 72 MCG capsule, Take 1 capsule (72 mcg total) by mouth daily before breakfast., Disp: 90 capsule, Rfl: 0 .  rosuvastatin (CRESTOR) 5 MG tablet, TAKE 1 TABLET(5 MG) BY MOUTH DAILY, Disp: 90 tablet, Rfl: 1 .  Semaglutide (RYBELSUS) 7 MG  TABS, Take 1 tablet by mouth every morning., Disp: 30 tablet, Rfl: 2 .  valsartan-hydrochlorothiazide (DIOVAN HCT) 160-25 MG tablet, Take 1 tablet by mouth daily. In place of hctz 25, Disp: 90 tablet, Rfl: 1  Allergies  Allergen Reactions  . Latex Itching    Burning  . Lisinopril Other (See Comments)    cough  . Pravastatin     Muscle ache   . Shrimp [Shellfish Allergy] Swelling    Lips will burn      ROS  Constitutional: Negative for fever or weight change.  Respiratory: Negative for cough and shortness of breath.   Cardiovascular: Negative for chest pain or palpitations.  Gastrointestinal: Negative for abdominal pain, no bowel changes.  Musculoskeletal: Negative for gait problem or joint swelling.  Skin: Negative for rash.  Neurological: Negative for dizziness or headache.  No other specific complaints in a complete review of systems (except as listed in HPI above).  Objective  Vitals:   01/13/19 0759  BP: (!) 140/94  Pulse: 73  Resp: 16  Temp: (!) 97.3 F (36.3 C)  TempSrc: Temporal  SpO2: 98%  Weight: 236 lb 1.6 oz (107.1 kg)  Height: 5\' 1"  (1.549 m)    Body mass index is 44.61 kg/m.  Physical Exam  Constitutional: Patient appears well-developed and well-nourished. No distress.  HENT: Head: Normocephalic and atraumatic. Ears: B TMs ok, no erythema or effusion; Nose: Nose normal. Mouth/Throat: Oropharynx is clear and moist. No oropharyngeal exudate.  Eyes: Conjunctivae and EOM are normal. Pupils are equal, round, and reactive to light. No scleral icterus.  Neck: Normal range of motion. Neck supple. No JVD present. No thyromegaly present.  Cardiovascular: Normal rate, regular rhythm and normal heart sounds.  No murmur heard. No BLE edema. Pulmonary/Chest: Effort normal and breath sounds normal. No respiratory distress. Abdominal: Soft. Bowel sounds are normal, no distension. There is no tenderness. no masses Breast: no lumps or masses, no nipple discharge or  rashes FEMALE GENITALIA:  Not done RECTAL: not done Musculoskeletal: Normal range of motion, no joint effusions. No gross deformities Neurological: he is alert and oriented to person, place, and time. No cranial nerve deficit. Coordination, balance, strength, speech and gait are normal.  Skin: Skin is warm and dry. No rash noted. No erythema.  Psychiatric: Patient has a normal mood and affect. behavior is normal. Judgment and thought content normal.  Fall Risk: Fall Risk  01/13/2019 09/24/2018 04/03/2018 03/05/2018 08/20/2017  Falls in the past year? 1 0 0 No No  Number falls in past yr: 1 0 0 - -  Injury with Fall? 1 0 0 - -  Risk for fall due to : - - - - -  Risk for fall due to: Comment - - - - -  Follow up - - - - -  Comment - - - - -    Functional Status Survey: Is the patient deaf or have difficulty hearing?: No Does the patient have difficulty seeing, even when wearing glasses/contacts?: No Does the patient have difficulty concentrating, remembering, or making decisions?: No Does the patient have difficulty walking or climbing stairs?: No Does the patient have difficulty dressing or bathing?: No Does the patient have difficulty doing errands alone  such as visiting a doctor's office or shopping?: No   Assessment & Plan  1. Morbid obesity (HCC)  Try adding Rylbelsus   2. Need for immunization against influenza  - Flu Vaccine QUAD High Dose(Fluad)  3. Flu vaccine need   4. Hypertension, benign  - amLODipine (NORVASC) 2.5 MG tablet; TAKE 1 TABLET(2.5 MG) BY MOUTH EVERY EVENING  Dispense: 90 tablet; Refill: 1 - valsartan-hydrochlorothiazide (DIOVAN HCT) 160-25 MG tablet; Take 1 tablet by mouth daily. In place of hctz 25  Dispense: 90 tablet; Refill: 1 - COMPLETE METABOLIC PANEL WITH GFR - CBC with Differential/Platelet  5. Pure hypercholesterolemia  - rosuvastatin (CRESTOR) 5 MG tablet; TAKE 1 TABLET(5 MG) BY MOUTH DAILY  Dispense: 90 tablet; Refill: 1 - Lipid  panel  6. Well adult exam  - COMPLETE METABOLIC PANEL WITH GFR - CBC with Differential/Platelet - Lipid panel - Hemoglobin A1c - VITAMIN D 25 Hydroxy (Vit-D Deficiency, Fractures)  7. Seasonal allergies  - fluticasone (FLONASE) 50 MCG/ACT nasal spray; Place 2 sprays into both nostrils daily.  Dispense: 48 g; Refill: 1  8. Vitamin D deficiency  - VITAMIN D 25 Hydroxy (Vit-D Deficiency, Fractures)  9. Diabetes mellitus screening  - Hemoglobin A1c  10. Breast cancer screening  - MM Digital Screening; Future  12. Pre-diabetes  - Semaglutide (RYBELSUS) 7 MG TABS; Take 1 tablet by mouth every morning.  Dispense: 30 tablet; Refill: 2 Discussed possible side effects, she denies family history of thyroid cancer and no personal history of pancreatitis  -USPSTF grade A and B recommendations reviewed with patient; age-appropriate recommendations, preventive care, screening tests, etc discussed and encouraged; healthy living encouraged; see AVS for patient education given to patient -Discussed importance of 150 minutes of physical activity weekly, eat two servings of fish weekly, eat one serving of tree nuts ( cashews, pistachios, pecans, almonds.Marland Kitchen) every other day, eat 6 servings of fruit/vegetables daily and drink plenty of water and avoid sweet beverages.

## 2019-01-14 LAB — CBC WITH DIFFERENTIAL/PLATELET
Absolute Monocytes: 524 cells/uL (ref 200–950)
Basophils Absolute: 11 cells/uL (ref 0–200)
Basophils Relative: 0.2 %
Eosinophils Absolute: 108 cells/uL (ref 15–500)
Eosinophils Relative: 1.9 %
HCT: 41.5 % (ref 35.0–45.0)
Hemoglobin: 13.4 g/dL (ref 11.7–15.5)
Lymphs Abs: 1653 cells/uL (ref 850–3900)
MCH: 27.8 pg (ref 27.0–33.0)
MCHC: 32.3 g/dL (ref 32.0–36.0)
MCV: 86.1 fL (ref 80.0–100.0)
MPV: 11.4 fL (ref 7.5–12.5)
Monocytes Relative: 9.2 %
Neutro Abs: 3403 cells/uL (ref 1500–7800)
Neutrophils Relative %: 59.7 %
Platelets: 193 10*3/uL (ref 140–400)
RBC: 4.82 10*6/uL (ref 3.80–5.10)
RDW: 14.6 % (ref 11.0–15.0)
Total Lymphocyte: 29 %
WBC: 5.7 10*3/uL (ref 3.8–10.8)

## 2019-01-14 LAB — COMPLETE METABOLIC PANEL WITH GFR
AG Ratio: 1.2 (calc) (ref 1.0–2.5)
ALT: 11 U/L (ref 6–29)
AST: 17 U/L (ref 10–35)
Albumin: 4.1 g/dL (ref 3.6–5.1)
Alkaline phosphatase (APISO): 41 U/L (ref 37–153)
BUN/Creatinine Ratio: 17 (calc) (ref 6–22)
BUN: 19 mg/dL (ref 7–25)
CO2: 31 mmol/L (ref 20–32)
Calcium: 10 mg/dL (ref 8.6–10.4)
Chloride: 101 mmol/L (ref 98–110)
Creat: 1.11 mg/dL — ABNORMAL HIGH (ref 0.50–0.99)
GFR, Est African American: 60 mL/min/{1.73_m2} (ref >=60)
GFR, Est Non African American: 52 mL/min/{1.73_m2} — ABNORMAL LOW (ref >=60)
Globulin: 3.3 g/dL (calc) (ref 1.9–3.7)
Glucose, Bld: 100 mg/dL — ABNORMAL HIGH (ref 65–99)
Potassium: 3.9 mmol/L (ref 3.5–5.3)
Sodium: 140 mmol/L (ref 135–146)
Total Bilirubin: 0.5 mg/dL (ref 0.2–1.2)
Total Protein: 7.4 g/dL (ref 6.1–8.1)

## 2019-01-14 LAB — HEMOGLOBIN A1C
Hgb A1c MFr Bld: 6.1 % of total Hgb — ABNORMAL HIGH (ref <5.7)
Mean Plasma Glucose: 128 (calc)
eAG (mmol/L): 7.1 (calc)

## 2019-01-14 LAB — VITAMIN D 25 HYDROXY (VIT D DEFICIENCY, FRACTURES): Vit D, 25-Hydroxy: 26 ng/mL — ABNORMAL LOW (ref 30–100)

## 2019-01-14 LAB — LIPID PANEL
Cholesterol: 169 mg/dL (ref <200)
HDL: 58 mg/dL (ref >=50)
LDL Cholesterol (Calc): 95 mg/dL (calc)
Non-HDL Cholesterol (Calc): 111 mg/dL (calc) (ref <130)
Total CHOL/HDL Ratio: 2.9 (calc) (ref <5.0)
Triglycerides: 71 mg/dL (ref <150)

## 2019-01-29 ENCOUNTER — Other Ambulatory Visit: Payer: Self-pay

## 2019-01-29 ENCOUNTER — Ambulatory Visit
Admission: RE | Admit: 2019-01-29 | Discharge: 2019-01-29 | Disposition: A | Discharge status: Home / Self Care | Payer: 59 | Source: Ambulatory Visit | Attending: Family Medicine | Admitting: Family Medicine

## 2019-01-29 ENCOUNTER — Other Ambulatory Visit: Payer: Self-pay | Admitting: Family Medicine

## 2019-01-29 DIAGNOSIS — Z1231 Encounter for screening mammogram for malignant neoplasm of breast: Secondary | ICD-10-CM | POA: Diagnosis not present

## 2019-01-29 DIAGNOSIS — R928 Other abnormal and inconclusive findings on diagnostic imaging of breast: Secondary | ICD-10-CM | POA: Diagnosis not present

## 2019-01-29 DIAGNOSIS — N6320 Unspecified lump in the left breast, unspecified quadrant: Secondary | ICD-10-CM

## 2019-01-29 DIAGNOSIS — Z1239 Encounter for other screening for malignant neoplasm of breast: Secondary | ICD-10-CM

## 2019-02-05 DIAGNOSIS — I10 Essential (primary) hypertension: Secondary | ICD-10-CM | POA: Diagnosis not present

## 2019-02-05 DIAGNOSIS — Z135 Encounter for screening for eye and ear disorders: Secondary | ICD-10-CM | POA: Diagnosis not present

## 2019-02-05 DIAGNOSIS — H18419 Arcus senilis, unspecified eye: Secondary | ICD-10-CM | POA: Diagnosis not present

## 2019-02-05 DIAGNOSIS — H25813 Combined forms of age-related cataract, bilateral: Secondary | ICD-10-CM | POA: Diagnosis not present

## 2019-02-05 DIAGNOSIS — E7800 Pure hypercholesterolemia, unspecified: Secondary | ICD-10-CM | POA: Diagnosis not present

## 2019-02-05 DIAGNOSIS — H524 Presbyopia: Secondary | ICD-10-CM | POA: Diagnosis not present

## 2019-02-05 DIAGNOSIS — H35363 Drusen (degenerative) of macula, bilateral: Secondary | ICD-10-CM | POA: Diagnosis not present

## 2019-02-06 ENCOUNTER — Ambulatory Visit
Admission: RE | Admit: 2019-02-06 | Discharge: 2019-02-06 | Disposition: A | Discharge status: Home / Self Care | Payer: 59 | Source: Ambulatory Visit | Attending: Family Medicine | Admitting: Family Medicine

## 2019-02-06 DIAGNOSIS — N6320 Unspecified lump in the left breast, unspecified quadrant: Secondary | ICD-10-CM

## 2019-02-06 DIAGNOSIS — R928 Other abnormal and inconclusive findings on diagnostic imaging of breast: Secondary | ICD-10-CM

## 2019-03-24 ENCOUNTER — Other Ambulatory Visit: Payer: Self-pay

## 2019-03-24 DIAGNOSIS — Z20822 Contact with and (suspected) exposure to covid-19: Secondary | ICD-10-CM

## 2019-03-25 LAB — NOVEL CORONAVIRUS, NAA: SARS-CoV-2, NAA: NOT DETECTED

## 2019-03-27 ENCOUNTER — Ambulatory Visit: Payer: Self-pay | Admitting: Family Medicine

## 2019-04-16 ENCOUNTER — Encounter: Payer: Self-pay | Admitting: Family Medicine

## 2019-04-17 ENCOUNTER — Ambulatory Visit (INDEPENDENT_AMBULATORY_CARE_PROVIDER_SITE_OTHER): Payer: 59 | Admitting: Family Medicine

## 2019-04-17 ENCOUNTER — Other Ambulatory Visit: Payer: Self-pay

## 2019-04-17 ENCOUNTER — Encounter: Payer: Self-pay | Admitting: Family Medicine

## 2019-04-17 VITALS — BP 130/82 | HR 96 | Temp 97.8°F | Resp 16 | Ht 61.0 in | Wt 227.0 lb

## 2019-04-17 DIAGNOSIS — R7303 Prediabetes: Secondary | ICD-10-CM | POA: Diagnosis not present

## 2019-04-17 DIAGNOSIS — F339 Major depressive disorder, recurrent, unspecified: Secondary | ICD-10-CM

## 2019-04-17 DIAGNOSIS — I779 Disorder of arteries and arterioles, unspecified: Secondary | ICD-10-CM | POA: Diagnosis not present

## 2019-04-17 DIAGNOSIS — R69 Illness, unspecified: Secondary | ICD-10-CM | POA: Diagnosis not present

## 2019-04-17 DIAGNOSIS — I10 Essential (primary) hypertension: Secondary | ICD-10-CM | POA: Diagnosis not present

## 2019-04-17 DIAGNOSIS — Z23 Encounter for immunization: Secondary | ICD-10-CM | POA: Diagnosis not present

## 2019-04-17 DIAGNOSIS — E7800 Pure hypercholesterolemia, unspecified: Secondary | ICD-10-CM

## 2019-04-17 MED ORDER — RYBELSUS 7 MG PO TABS: 1.0000 | ORAL_TABLET | ORAL | 2 refills | Status: DC

## 2019-04-17 MED ORDER — DULOXETINE HCL 30 MG PO CPEP: 30.0000 mg | ORAL_CAPSULE | Freq: Every day | ORAL | 0 refills | Status: DC

## 2019-04-17 MED ORDER — ROSUVASTATIN CALCIUM 10 MG PO TABS: ORAL_TABLET | ORAL | 0 refills | Status: DC

## 2019-04-17 NOTE — Progress Notes (Signed)
Name: Terri Werner   MRN: [id number]    DOB: [birth year]   Date:04/17/2019       Progress Note  Subjective  Chief Complaint  Chief Complaint  Patient presents with  . Hypertension  . Hyperlipidemia  . Obesity  . Prediabetes    HPI  HTN:she is on Norvasc, HCTZ and Diovan, she had a cough with ACE but doing well on Valsartan, bp is at goal today .She denieschest pain, palpitation, dizziness.  Chronic low back pain: she used to see Dr. Cherylann Ratel, [place] and Dr. [place] Mechanic. She states pain is daily around3-4/10now. She states sitting much more now with televisits and pain has been worse. She failed neurontin and lyrica, using topical medications and otc. Sheis now on Tylenol daily. She has seen a chiropractor in the past   Neck pain: she has noticed neck pain going dorn from right side down her shoulder, she states she has intermittent numbness of both hands and arms   Recurrent skin infection: starts as a pimple/blister  and gets bigger and tender and resolves by itself, discussed weekly baths with one quarter cup of clorox bleach   Morbid obesity/prediabetes: she started to gain weight around 2011 when taking care of husband ( died of cancer) after that also took care of her sister, she has a stressful job and skips meals.She is also working from home and sitting more often, not physically active, she has pre-diabetes previous A1C done at [facility] was up to 6.4% last one was 6.1% , she is on Rybelsus since 01/2019 and has lost 9 lbs. No side effects of medication   Chronic constipation: she states always had problems,taking otc medication because of cost of Linzess   Hyperlipidemia: tried pravastatin but caused muscle pain, she has carotid atherosclerosis and tortuous ICA,LDL went up from 73 to above 90. 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 She is tired and overwhelmed.  Explained importance of therapy and discussed adding medication I advised to try duloxetine since it also helps with pain    Patient Active Problem List   Diagnosis Date Noted  . Morbid obesity (HCC) 09/24/2018  . Unspecified inflammatory spondylopathy, lumbar region (HCC) 03/05/2018  . Lumbar spondylolysis 08/20/2017  . Lumbar facet arthropathy 08/20/2017  . Lumbar degenerative disc disease 08/20/2017  . Chronic constipation 07/30/2017  . Primary osteoarthritis involving multiple joints 04/17/2017  . Postmenopausal 10/12/2016  . Prediabetes 10/12/2016  . Lumbago 08/04/2015  . Diverticulitis of colon 07/05/2014  . Carotid artery disorder (HCC) 04/27/2014  . Hyperlipidemia 10/08/2013  . Arthralgia 06/22/2011  . Hypertension, benign 11/19/2009    Past Surgical History:  Procedure Laterality Date  . ABDOMINAL HYSTERECTOMY  2006  . APPENDECTOMY  1980  . CESAREAN SECTION  1981  . COLONOSCOPY WITH PROPOFOL N/A 11/01/2014   Procedure: COLONOSCOPY WITH PROPOFOL;  Surgeon: Scot Jun, MD;  Location: [facility] ENDOSCOPY;  Service: Endoscopy;  Laterality: N/A;  . ECTOPIC PREGNANCY SURGERY  1980  . HERNIA REPAIR  2006   Umbilical  . KNEE ARTHROSCOPY Left 2006   torn meniscus  . TONSILLECTOMY  1971  . TOTAL ABDOMINAL HYSTERECTOMY W/ BILATERAL  SALPINGOOPHORECTOMY  2006  . VESICOVAGINAL FISTULA CLOSURE W/ TAH      Family History  Problem Relation Age of Onset  . Breast cancer Sister 42  . Hypertension Mother   . Alzheimer's disease Mother   . Dementia Mother   . Hypertension Maternal Grandfather   . Hyperlipidemia Maternal Grandfather   . Birth defects Maternal Grandmother        Bladder  . Colon cancer Maternal  Grandmother   . Congestive Heart Failure Father   . Hyperlipidemia Father   . Dementia Paternal Grandmother   . Dementia Paternal Grandfather   . Thyroid disease Sister     Social History   Socioeconomic History  . Marital status: Widowed    Spouse name: Not on file  . Number of children: 2  . Years of education: Not on file  . Highest education level: Master's degree (e.g., MA, MS, MEng, MEd, MSW, MBA)  Occupational History  . Occupation: [employer]    Comment: Full Time    Employer: [employer]  Social Needs  . Financial resource strain: Hard  . Food insecurity    Worry: Never true    Inability: Never true  . Transportation needs    Medical: No    Non-medical: No  Tobacco Use  . Smoking status: Never Smoker  . Smokeless tobacco: Never Used  Substance and Sexual Activity  . Alcohol use: Yes    Comment: Occasional  . Drug use: No  . Sexual activity: Not Currently    Partners: Male  Lifestyle  . Physical activity    Days per week: 3 days    Minutes per session: 20 min  . Stress: Not at all  Relationships  . Social connections    Talks on phone: More than three times a week    Gets together: Twice a week    Attends religious service: More than 4 times per year    Active member of club or organization: Yes    Attends meetings of clubs or organizations: More than 4 times per year    Relationship status: Widowed  . Intimate partner violence    Fear of current or ex partner: No    Emotionally abused: No    Physically abused: No    Forced sexual activity: No  Other Topics Concern  . Not on file  Social History Narrative   Works for [employer] and helps her 3 grandchildren she helps with since her daughter just recently became employed again after surgery.      Regular Exercise -  NO   Daily Caffeine Use:  1 soda/tea            Current Outpatient Medications:  .  acetaminophen (TYLENOL) 500 MG tablet, Take 500 mg by  mouth every 6 (six) hours as needed., Disp: , Rfl:  .  amLODipine (NORVASC) 2.5 MG tablet, TAKE 1 TABLET(2.5 MG) BY MOUTH EVERY EVENING, Disp: 90 tablet, Rfl: 1 .  aspirin 81 MG tablet, Take 81 mg by mouth daily., Disp: , Rfl:  .  Cholecalciferol (VITAMIN D) 2000 UNITS CAPS, Take 2,000 Units by mouth daily., Disp: , Rfl:  .  Coenzyme Q10 10 MG capsule, Take 10 mg by mouth daily., Disp: , Rfl:  .  diclofenac sodium (VOLTAREN) 1 % GEL, Apply 2 g topically 4 (four) times daily. Uses a couple time a week, Disp: 100 g, Rfl: 2 .  fluticasone (FLONASE) 50 MCG/ACT nasal spray,  Place 2 sprays into both nostrils daily., Disp: 48 g, Rfl: 1 .  lidocaine (LIDODERM) 5 %, Place 3 patches onto the skin every 12 (twelve) hours as needed. Remove & Discard patch within 12 hours, Disp: 90 patch, Rfl: 2 .  linaclotide (LINZESS) 72 MCG capsule, Take 1 capsule (72 mcg total) by mouth daily before breakfast., Disp: 90 capsule, Rfl: 0 .  rosuvastatin (CRESTOR) 5 MG tablet, TAKE 1 TABLET(5 MG) BY MOUTH DAILY, Disp: 90 tablet, Rfl: 1 .  Semaglutide (RYBELSUS) 7 MG TABS, Take 1 tablet by mouth every morning., Disp: 30 tablet, Rfl: 2 .  valsartan-hydrochlorothiazide (DIOVAN HCT) 160-25 MG tablet, Take 1 tablet by mouth daily. In place of hctz 25, Disp: 90 tablet, Rfl: 1  Allergies  Allergen Reactions  . Latex Itching    Burning  . Lisinopril Other (See Comments)    cough  . Pravastatin     Muscle ache   . Shrimp [Shellfish Allergy] Swelling    Lips will burn     I personally reviewed active problem list, medication list, allergies, family history, social history, health maintenance with the patient/caregiver today.   ROS  Constitutional: Negative for fever , positive for weight change - weight loss   Respiratory: Negative for cough and shortness of breath.   Cardiovascular: Negative for chest pain or palpitations.  Gastrointestinal: Negative for abdominal pain, no bowel changes.  Musculoskeletal: Negative for  gait problem or joint swelling.  Skin: Negative for rash.  Neurological: Negative for dizziness, positive for  headache.  No other specific complaints in a complete review of systems (except as listed in HPI above).  Objective   Vitals:   04/17/19 0753  BP: 130/82  Pulse: 96  Resp: 16  Temp: 97.8 F (36.6 C)  TempSrc: Temporal  SpO2: 97%  Weight: 227 lb (103 kg)  Height: 5\' 1"  (1.549 m)    Body mass index is 42.89 kg/m.  Physical Exam  Constitutional: Patient appears well-developed and well-nourished. Obese  No distress.  HEENT: head atraumatic, normocephalic, pupils equal and reactive to light normal rom of neck  Cardiovascular: Normal rate, regular rhythm and normal heart sounds.  No murmur heard. No BLE edema. Pulmonary/Chest: Effort normal and breath sounds normal. No respiratory distress. Abdominal: Soft.  There is no tenderness. Psychiatric: Patient has a normal mood and affect. behavior is normal. Judgment and thought content normal.  Recent Results (from the past 2160 hour(s))  Novel Coronavirus, NAA ([facility])     Status: None   Collection Time: 03/24/19 12:00 AM   Specimen: Nasopharyngeal(NP) swabs in vial transport medium   NASOPHARYNGE  TESTING  Result Value Ref Range   SARS-CoV-2, NAA Not Detected Not Detected    Comment: Testing was performed using the cobas(R) SARS-CoV-2 test. This nucleic acid amplification test was developed and its performance characteristics determined by [employer]. Nucleic acid amplification tests include PCR and TMA. This test has not been FDA cleared or approved. This test has been authorized by FDA under an Emergency Use Authorization (EUA). This test is only authorized for the duration of time the declaration that circumstances exist justifying the authorization of the emergency use of in vitro diagnostic tests for detection of SARS-CoV-2 virus and/or diagnosis of COVID-19 infection under section 564(b)(1) of the Act,  21 U.S.C. 161WRU-0(A) (1), unless the authorization is terminated or revoked sooner. When diagnostic testing is negative, the possibility of a false negative result should be considered in the context of a patient's recent exposures and  the presence of clinical signs and symptoms consistent with COVID-19. An individual without symptoms  of COVID-19 and who is not shedding SARS-CoV-2 virus would expect to have a negative (not detected) result in this assay.       PHQ2/9: Depression screen [facility] 2/9 04/17/2019 01/13/2019 09/24/2018 04/03/2018 03/05/2018  Decreased Interest 2 0 0 0 0  Down, Depressed, Hopeless 3 1 1 1 2   PHQ - 2 Score 5 1 1 1 2   Altered sleeping 3 0 0 0 0  Tired, decreased energy 3 3 3 1  0  Change in appetite 1 0 0 0 0  Feeling bad or failure about yourself  0 0 0 0 0  Trouble concentrating 2 0 0 0 0  Moving slowly or fidgety/restless 0 0 0 0 0  Suicidal thoughts 1 0 0 0 0  PHQ-9 Score 15 4 4 2 2   Difficult doing work/chores Somewhat difficult Not difficult at all Somewhat difficult Not difficult at all Not difficult at all    phq 9 is positive   Fall Risk: Fall Risk  01/13/2019 09/24/2018 04/03/2018 03/05/2018 08/20/2017  Falls in the past year? 1 0 0 No No  Number falls in past yr: 1 0 0 - -  Injury with Fall? 1 0 0 - -  Risk for fall due to : - - - - -  Risk for fall due to: Comment - - - - -  Follow up - - - - -  Comment - - - - -    Assessment & Plan  1. Need for 23-polyvalent pneumococcal polysaccharide vaccine  - Pneumococcal polysaccharide vaccine 23-valent greater than or equal to 2yo subcutaneous/IM  2. Pre-diabetes  - Semaglutide (RYBELSUS) 7 MG TABS; Take 1 tablet by mouth every morning.  Dispense: 30 tablet; Refill: 2  3. Pure hypercholesterolemia  - rosuvastatin (CRESTOR) 10 MG tablet; TAKE 1 TABLET(5 MG) BY MOUTH DAILY  Dispense: 90 tablet; Refill: 0  4. Morbid obesity (HCC)  Discussed with the patient the risk posed by an increased BMI.  Discussed importance of portion control, calorie counting and at least 150 minutes of physical activity weekly. Avoid sweet beverages and drink more water. Eat at least 6 servings of fruit and vegetables daily   5. Carotid artery disorder (HCC)  - rosuvastatin (CRESTOR) 10 MG tablet; TAKE 1 TABLET(5 MG) BY MOUTH DAILY  Dispense: 90 tablet; Refill: 0  6. Major depression, recurrent, chronic (HCC)  - DULoxetine (CYMBALTA) 30 MG capsule; Take 1-2 capsules (30-60 mg total) by mouth daily. Start at 30 mg daily and after first week take two daily  Dispense: 60 capsule; Refill: 0  7. Hypertension, benign  At the goal

## 2019-05-04 ENCOUNTER — Encounter: Payer: Self-pay | Admitting: Family Medicine

## 2019-05-05 ENCOUNTER — Encounter: Payer: Self-pay | Admitting: Family Medicine

## 2019-05-05 ENCOUNTER — Other Ambulatory Visit: Payer: Self-pay

## 2019-05-05 ENCOUNTER — Ambulatory Visit (INDEPENDENT_AMBULATORY_CARE_PROVIDER_SITE_OTHER): Payer: 59 | Admitting: Family Medicine

## 2019-05-05 VITALS — BP 139/78 | Temp 98.0°F | Ht 61.0 in | Wt 225.0 lb

## 2019-05-05 DIAGNOSIS — R42 Dizziness and giddiness: Secondary | ICD-10-CM | POA: Diagnosis not present

## 2019-05-05 MED ORDER — MECLIZINE HCL 25 MG PO TABS: 12.5000 mg | ORAL_TABLET | Freq: Three times a day (TID) | ORAL | 0 refills | Status: DC | PRN

## 2019-05-05 NOTE — Progress Notes (Signed)
Name: Terri Werner   MRN: [id number]    DOB: December 19, 1950   Date:05/05/2019       Progress Note  Subjective:    Chief Complaint  Chief Complaint  Patient presents with  . Dizziness    Onset early Sunday, 05/03/2019,morning    I connected with  Terri Werner  on 05/05/19 at  9:20 AM EST by a video enabled telemedicine application and verified that I am speaking with the correct person using two identifiers.  I discussed the limitations of evaluation and management by telemedicine and the availability of in person appointments. The patient expressed understanding and agreed to proceed. Staff also discussed with the patient that there may be a patient responsible charge related to this service. Patient Location: home Provider Location: [facility] clinic Additional Individuals present: none  HPI Pt presents vis virtual encounter for an of dizziness episode.   She had episode Sunday morning, about 2 days ago, a hot flash with sweats woke her from her sleep at about 3 or 4 am.  She notes that she felt very hot and sweaty she felt like "everything was rolling around...visually everything was rolling backwards"  She got up in bed, drank a small amount of water, the dizziness caused nausea and she did vomit once and was able to make it to her restroom, emesis was mostly water, clear to yellow, no other vomiting episodes, patient was able to get back in bed. The sx lasted at least 30-40 min.  She did go back to bed and stayed in bed until about 9 AM she had no recurrence of her symptoms since early Sunday morning. She states that she did take all of her blood pressure medicines Saturday night before going to bed also took her baby aspirin and she has history of poor med compliance she just forgets to take her medicines most the time.  She states that she does take her blood pressure at home it usually runs systolic 130-140 today was 139/78.  Around the time of her symptoms she  was unable to check her blood pressure or blood sugar.  She denies any associated facial droop, slurred speech, focal numbness or weakness, headache, CP, SOB, palpitations, Abd pain, Back pain, Le edema, orthopnea, PND.  She denies any recent otalgia, ear fullness or ENT/URI sx or allergies.  She also denies any history of menopausal hot flashes. 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   Patient Active Problem List   Diagnosis Date Noted  . Morbid obesity (HCC) 09/24/2018  . Unspecified inflammatory spondylopathy, lumbar region (HCC) 03/05/2018  . Lumbar spondylolysis 08/20/2017  . Lumbar facet arthropathy 08/20/2017  . Lumbar degenerative disc disease 08/20/2017  . Chronic constipation 07/30/2017  . Primary osteoarthritis involving multiple joints 04/17/2017  . Postmenopausal 10/12/2016  . Prediabetes 10/12/2016  . Lumbago 08/04/2015  . Diverticulitis of colon 07/05/2014  . Carotid artery disorder (HCC) 04/27/2014  . Hyperlipidemia 10/08/2013  . Arthralgia 06/22/2011  . Hypertension, benign 11/19/2009    Social History   Tobacco Use  . Smoking status: Never Smoker  . Smokeless tobacco: Never Used  Substance Use Topics  . Alcohol  use: Yes    Comment: Occasional     Current Outpatient Medications:  .  acetaminophen (TYLENOL) 500 MG tablet, Take 500 mg by mouth every 6 (six) hours as needed., Disp: , Rfl:  .  amLODipine (NORVASC) 2.5 MG tablet, TAKE 1 TABLET(2.5 MG) BY MOUTH EVERY EVENING, Disp: 90 tablet, Rfl: 1 .  aspirin 81 MG tablet, Take 81 mg by mouth daily., Disp: , Rfl:  .  Cholecalciferol (VITAMIN D) 2000 UNITS CAPS, Take 2,000 Units by mouth daily., Disp: , Rfl:  .   Coenzyme Q10 10 MG capsule, Take 10 mg by mouth daily., Disp: , Rfl:  .  diclofenac sodium (VOLTAREN) 1 % GEL, Apply 2 g topically 4 (four) times daily. Uses a couple time a week, Disp: 100 g, Rfl: 2 .  DULoxetine (CYMBALTA) 30 MG capsule, Take 1-2 capsules (30-60 mg total) by mouth daily. Start at 30 mg daily and after first week take two daily, Disp: 60 capsule, Rfl: 0 .  fluticasone (FLONASE) 50 MCG/ACT nasal spray, Place 2 sprays into both nostrils daily., Disp: 48 g, Rfl: 1 .  lidocaine (LIDODERM) 5 %, Place 3 patches onto the skin every 12 (twelve) hours as needed. Remove & Discard patch within 12 hours, Disp: 90 patch, Rfl: 2 .  rosuvastatin (CRESTOR) 10 MG tablet, TAKE 1 TABLET(5 MG) BY MOUTH DAILY, Disp: 90 tablet, Rfl: 0 .  Semaglutide (RYBELSUS) 7 MG TABS, Take 1 tablet by mouth every morning., Disp: 30 tablet, Rfl: 2 .  valsartan-hydrochlorothiazide (DIOVAN HCT) 160-25 MG tablet, Take 1 tablet by mouth daily. In place of hctz 25, Disp: 90 tablet, Rfl: 1 .  meclizine (ANTIVERT) 25 MG tablet, Take 0.5-1 tablets (12.5-25 mg total) by mouth 3 (three) times daily as needed for dizziness., Disp: 15 tablet, Rfl: 0  Allergies  Allergen Reactions  . Latex Itching    Burning  . Lisinopril Other (See Comments)    cough  . Pravastatin     Muscle ache   . Shrimp [Shellfish Allergy] Swelling    Lips will burn     I personally reviewed active problem list, medication list, allergies, family history, social history, health maintenance, notes from last encounter, lab results, imaging with the patient/caregiver today.   Review of Systems  Constitutional: Negative.  Negative for chills, fever, malaise/fatigue and weight loss.  HENT: Negative for congestion, ear discharge, ear pain, hearing loss, sinus pain, sore throat and tinnitus.   Eyes: Negative for blurred vision, double vision, photophobia, pain, discharge and redness.  Respiratory: Negative.  Negative for cough, hemoptysis, shortness of  breath and wheezing.   Cardiovascular: Negative.  Negative for chest pain, palpitations, orthopnea, claudication, leg swelling and PND.  Gastrointestinal: Negative.  Negative for abdominal pain, blood in stool, constipation, diarrhea, heartburn and melena.  Genitourinary: Negative.  Negative for dysuria, frequency and urgency.  Musculoskeletal: Negative.  Negative for falls and myalgias.  Skin: Negative.   Neurological: Positive for dizziness. Negative for tingling, tremors, sensory change, speech change, focal weakness, loss of consciousness, weakness and headaches.  Endo/Heme/Allergies: Negative.  Negative for environmental allergies and polydipsia. Does not bruise/bleed easily.  Psychiatric/Behavioral: Negative.      Objective:   Virtual encounter, vitals limited, only able to obtain the following Today's Vitals   05/05/19 0912  BP: 139/78  Temp: 98 F (36.7 C)  Weight: 225 lb (102.1 kg)  Height: 5\' 1"  (1.549 m)  PainSc: 5    Body mass index is 42.51 kg/m. Nursing Note and Vital  Signs reviewed.  Physical Exam Vitals and nursing note reviewed.  Constitutional:      General: She is not in acute distress.    Appearance: Normal appearance. She is well-developed. She is not ill-appearing, toxic-appearing or diaphoretic.  HENT:     Head: Normocephalic and atraumatic.     Nose: Nose normal.  Eyes:     General:        Right eye: No discharge.        Left eye: No discharge.     Conjunctiva/sclera: Conjunctivae normal.  Neck:     Trachea: No tracheal deviation.  Pulmonary:     Effort: Pulmonary effort is normal. No respiratory distress.     Breath sounds: No stridor.  Skin:    Coloration: Skin is not jaundiced or pale.     Findings: No rash.  Neurological:     Mental Status: She is alert.     Cranial Nerves: No dysarthria or facial asymmetry.     Motor: No weakness or abnormal muscle tone.     Coordination: Coordination is intact. Coordination normal.  Psychiatric:          Mood and Affect: Mood normal.        Behavior: Behavior normal.    PE limited by telephone encounter  No results found for this or any previous visit (from the past 72 hour(s)).  Assessment and Plan:     ICD-10-CM   1. Episode of dizziness  R42 CBC w/ Diff    CMP w GFR    TSH    UA w/reflex microscopy, LAB    Urine Culture   may have been BPPV, also possibly hypotension, hypoglycemia, menopausal sx, pt has not had any sx since single dizziness episode - no cardiac sx associated  Did advise patient to come in person if having recurrent episodes would want to do a physical exam looking her ears do a neuro exam possibly EKG. I explained that if she would like to do some basic labs we could at this time but it is reassuring that she did not have any other cardiac, neuro symptoms and no prolonged or recurring symptoms since the single episode about 3 days ago. I did encourage her to separate her blood pressure medicine to take valsartan hydrochlorothiazide in the morning and Norvasc at night since she does not take her medications regularly may have been low blood pressure.  She is also on Rybelsus which may have caused some of her symptoms but unlikely to drop her sugar without any other medications or insulin to cause hypoglycemia. If she comes in would like to r/o thyroid dysfunction, electrolyte abnormality, anemia, UTI   -Red flags and when to present for emergency care or RTC including fever >101.62F, chest pain, shortness of breath, new/worsening/un-resolving symptoms,  reviewed with patient at time of visit. Follow up and care instructions discussed and provided in AVS. - I discussed the assessment and treatment plan with the patient. The patient was provided an opportunity to ask questions and all were answered. The patient agreed with the plan and demonstrated an understanding of the instructions.  I provided 27 minutes of non-face-to-face time during this encounter.  Danelle Berry,  PA-C 05/05/19 1:02 PM

## 2019-05-09 ENCOUNTER — Other Ambulatory Visit: Payer: Self-pay | Admitting: Family Medicine

## 2019-05-09 DIAGNOSIS — F339 Major depressive disorder, recurrent, unspecified: Secondary | ICD-10-CM

## 2019-05-12 ENCOUNTER — Other Ambulatory Visit: Payer: Self-pay | Admitting: Family Medicine

## 2019-05-12 DIAGNOSIS — I10 Essential (primary) hypertension: Secondary | ICD-10-CM

## 2019-06-03 ENCOUNTER — Ambulatory Visit: Payer: 59 | Admitting: Family Medicine

## 2019-06-30 ENCOUNTER — Other Ambulatory Visit: Payer: Self-pay | Admitting: Family Medicine

## 2019-06-30 DIAGNOSIS — R7303 Prediabetes: Secondary | ICD-10-CM

## 2019-06-30 NOTE — Telephone Encounter (Signed)
Requested medication (s) are due for refill today: yes  Requested medication (s) are on the active medication list:yes  Last refill:  04/17/19    Future visit scheduled: no  Notes to clinic:   Medication is off protocol    Requested Prescriptions  Pending Prescriptions Disp Refills   RYBELSUS 7 MG TABS [Pharmacy Med Name: RYBELSUS 7MG  TABLETS] 30 tablet 2    Sig: Take 1 tablet by mouth every morning.      Off-Protocol Failed - 06/30/2019  4:36 PM      Failed - Medication not assigned to a protocol, review manually.      Passed - Valid encounter within last 12 months    Recent Outpatient Visits           1 month ago Episode of dizziness   [facility] Delsa Grana, PA-C   2 months ago Major depression, recurrent, chronic [facility])   [facility] Steele Sizer, MD   5 months ago Morbid obesity [facility])   [facility] Steele Sizer, MD   9 months ago Morbid obesity [facility])   [facility] Steele Sizer, MD   1 year ago Hypertension, benign   [facility] Steele Sizer, MD

## 2019-07-23 ENCOUNTER — Ambulatory Visit (INDEPENDENT_AMBULATORY_CARE_PROVIDER_SITE_OTHER): Payer: 59 | Admitting: Family Medicine

## 2019-07-23 ENCOUNTER — Encounter: Payer: Self-pay | Admitting: Family Medicine

## 2019-07-23 ENCOUNTER — Other Ambulatory Visit: Payer: Self-pay | Admitting: Family Medicine

## 2019-07-23 ENCOUNTER — Other Ambulatory Visit: Payer: Self-pay

## 2019-07-23 DIAGNOSIS — E7800 Pure hypercholesterolemia, unspecified: Secondary | ICD-10-CM

## 2019-07-23 DIAGNOSIS — I10 Essential (primary) hypertension: Secondary | ICD-10-CM

## 2019-07-23 DIAGNOSIS — R7303 Prediabetes: Secondary | ICD-10-CM

## 2019-07-23 DIAGNOSIS — E559 Vitamin D deficiency, unspecified: Secondary | ICD-10-CM | POA: Diagnosis not present

## 2019-07-23 DIAGNOSIS — I779 Disorder of arteries and arterioles, unspecified: Secondary | ICD-10-CM

## 2019-07-23 MED ORDER — RYBELSUS 7 MG PO TABS: 1.0000 | ORAL_TABLET | ORAL | 5 refills | Status: DC

## 2019-07-23 MED ORDER — VALSARTAN-HYDROCHLOROTHIAZIDE 320-25 MG PO TABS: 1.0000 | ORAL_TABLET | Freq: Every day | ORAL | 1 refills | Status: DC

## 2019-07-23 MED ORDER — ROSUVASTATIN CALCIUM 10 MG PO TABS: 10.0000 mg | ORAL_TABLET | Freq: Every day | ORAL | 1 refills | Status: DC

## 2019-07-23 NOTE — Patient Instructions (Signed)
Medications for weight loss:  [employer]

## 2019-07-23 NOTE — Progress Notes (Signed)
Name: Terri Werner   MRN: [id number]    DOB: 1950/11/15   Date:07/23/2019       Progress Note  Subjective  Chief Complaint  Chief Complaint  Patient presents with  . Medication Refill  . Depression    Took Cymbalta for awhile and quit taking it because it made her sleepy and too relaxed  . Chronic low back apin  . Morbid Obesity    Needs refill on Rybelsus  . Hyperlipidemia    Gets very achy with her cholesterol medication  . Chronic constipation    HPI  HTN:she states norvasc causes body aches, last dose was yesterday morning, bp is at goal, we will stop medication, change valsartan hctz from 160/25 to 320/25 today . No chest pain, palpitation or lower extremity edema   Chronic low back pain: she used to see Dr. Cherylann Ratel, [place] and Dr. [place] Mechanic. She states pain is daily around 3-5/10, right now is zero  She states sitting much more now with televisits and pain has been worse. She failed neurontin and lyrica, using topical medications and otc. Sheis now on Tylenol daily. Unchanged   Neck pain: she has noticed neck pain going dorn from right side down her shoulder, she states she has intermittent numbness of both hands and arms   Recurrent skin infection: starts as a pimple/blister  and gets bigger and tender and resolves by itself, doing better since bathing with clorox and water baths weekly   Morbid obesity/prediabetes: she started to gain weight around 2011 when taking care of husband ( died of cancer) after that also took care of her sister, she has a stressful job and skips meals.She is also working from home and sitting more often, not physically active, she has pre-diabetes previous A1C done at [facility] was up to 6.6% last one was 6.1% , she is on Rybelsus since 01/2019 and had lost 9 lbs, however not on medication for the past month, she states could not fill it ( we will check if it was a PA problem), she  has gained 5 lbs in the past 3 months and would like to resume medication   Chronic constipation: she states always had problems,taking otc medication because of cost of [employer]. She is taking otc medication daily to have a bowel movement , she has to strain if she skips medications.   Hyperlipidemia: tried pravastatin but caused muscle pain, she has carotid atherosclerosis and tortuous ICA,LDL went up from 73 to above 90. We adjusted Crestor dose from 5 mg to 10 mg and she will have repeat labs   Major Depression : she is still overwhelmed at work, a lot of volume,  Currently only working from home  She states doing a little better, she has two other people on her team and she is doing better. She tried duloxetine but felt too mellow and stopped it   Patient Active Problem List   Diagnosis Date Noted  . Morbid obesity (HCC) 09/24/2018  . Unspecified inflammatory spondylopathy, lumbar region (HCC) 03/05/2018  . Lumbar spondylolysis 08/20/2017  . Lumbar facet arthropathy 08/20/2017  . Lumbar degenerative disc disease 08/20/2017  . Chronic constipation 07/30/2017  . Primary osteoarthritis involving multiple joints 04/17/2017  . Postmenopausal 10/12/2016  . Prediabetes 10/12/2016  . Lumbago 08/04/2015  . Diverticulitis of colon 07/05/2014  . Carotid artery disorder (HCC) 04/27/2014  . Hyperlipidemia 10/08/2013  . Arthralgia 06/22/2011  . Hypertension, benign 11/19/2009    Past Surgical  History:  Procedure Laterality Date  . ABDOMINAL HYSTERECTOMY  2006  . APPENDECTOMY  1980  . CESAREAN SECTION  1981  . COLONOSCOPY WITH PROPOFOL N/A 11/01/2014   Procedure: COLONOSCOPY WITH PROPOFOL;  Surgeon: Scot Jun, MD;  Location: [facility] ENDOSCOPY;  Service: Endoscopy;  Laterality: N/A;  . ECTOPIC PREGNANCY SURGERY  1980  . HERNIA REPAIR  2006   Umbilical  . KNEE ARTHROSCOPY Left 2006   torn meniscus  . TONSILLECTOMY  1971  . TOTAL ABDOMINAL HYSTERECTOMY W/ BILATERAL  SALPINGOOPHORECTOMY  2006  . VESICOVAGINAL FISTULA CLOSURE W/ TAH      Family History  Problem Relation Age of Onset  . Breast cancer Sister 56  . Hypertension Mother   . Alzheimer's disease Mother   . Dementia Mother   . Hypertension Maternal Grandfather   . Hyperlipidemia Maternal Grandfather   . Birth defects Maternal Grandmother        Bladder  . Colon cancer Maternal Grandmother   . Congestive Heart Failure Father   . Hyperlipidemia Father   . Dementia Paternal Grandmother   . Dementia Paternal Grandfather   . Thyroid disease Sister     Social History   Tobacco Use  . Smoking status: Never Smoker  . Smokeless tobacco: Never Used  Substance Use Topics  . Alcohol use: Yes    Comment: Occasional     Current Outpatient Medications:  .  acetaminophen (TYLENOL) 500 MG tablet, Take 500 mg by mouth every 6 (six) hours as needed., Disp: , Rfl:  .  aspirin 81 MG tablet, Take 81 mg by mouth daily., Disp: , Rfl:  .  Cholecalciferol (VITAMIN D) 2000 UNITS CAPS, Take 2,000 Units by mouth daily., Disp: , Rfl:  .  Coenzyme Q10 10 MG capsule, Take 10 mg by mouth daily., Disp: , Rfl:  .  diclofenac sodium (VOLTAREN) 1 % GEL, Apply 2 g topically 4 (four) times daily. Uses a couple time a week, Disp: 100 g, Rfl: 2 .  fluticasone (FLONASE) 50 MCG/ACT nasal spray, Place 2 sprays into both nostrils daily., Disp: 48 g, Rfl: 1 .  lidocaine (LIDODERM) 5 %, Place 3 patches onto the skin every 12 (twelve) hours as needed. Remove & Discard patch within 12 hours, Disp: 90 patch, Rfl: 2 .  Magnesium (V-R MAGNESIUM) 250 MG TABS, Take by mouth., Disp: , Rfl:  .  meclizine (ANTIVERT) 25 MG tablet, Take 0.5-1 tablets (12.5-25 mg total) by mouth 3 (three) times daily as needed for dizziness., Disp: 15 tablet, Rfl: 0 .  rosuvastatin (CRESTOR) 10 MG tablet, TAKE 1 TABLET(5 MG) BY MOUTH DAILY, Disp: 90 tablet, Rfl: 0 .  RYBELSUS 7 MG TABS, TAKE 1 TABLET BY MOUTH EVERY MORNING, Disp: 30 tablet, Rfl: 0 .   valsartan-hydrochlorothiazide (DIOVAN HCT) 160-25 MG tablet, Take 1 tablet by mouth daily. In place of hctz 25, Disp: 90 tablet, Rfl: 1  Allergies  Allergen Reactions  . Latex Itching    Burning  . Lisinopril Other (See Comments)    cough  . Pravastatin     Muscle ache   . Shrimp [Shellfish Allergy] Swelling    Lips will burn     I personally reviewed active problem list, medication list, allergies, family history with the patient/caregiver today.   ROS  Constitutional: Negative for fever, positive for weight change.  Respiratory: Negative for cough and shortness of breath.   Cardiovascular: Negative for chest pain or palpitations.  Gastrointestinal: Negative for abdominal pain, no bowel changes.  Musculoskeletal: Negative for gait problem or joint swelling.  Skin: Negative for rash.  Neurological: Negative for dizziness or headache.  No other specific complaints in a complete review of systems (except as listed in HPI above).  Objective  Vitals:   07/23/19 1019  BP: 122/70  Pulse: 82  Resp: 16  Temp: (!) 97.3 F (36.3 C)  TempSrc: Temporal  SpO2: 98%  Weight: 233 lb 9.6 oz (106 kg)  Height: 5\' 1"  (1.549 m)    Body mass index is 44.14 kg/m.  Physical Exam  Constitutional: Patient appears well-developed and well-nourished. Obese  No distress.  HEENT: head atraumatic, normocephalic, pupils equal and reactive to light Cardiovascular: Normal rate, regular rhythm and normal heart sounds.  No murmur heard. No BLE edema. Pulmonary/Chest: Effort normal and breath sounds normal. No respiratory distress. Abdominal: Soft.  There is no tenderness. Psychiatric: Patient has a normal mood and affect. behavior is normal. Judgment and thought content normal.   PHQ2/9: Depression screen [facility] 2/9 07/23/2019 05/05/2019 04/17/2019 01/13/2019 09/24/2018  Decreased Interest 0 1 2 0 0  Down, Depressed, Hopeless 0 0 3 1 1   PHQ - 2 Score 0 1 5 1 1   Altered sleeping 0 0 3 0 0  Tired,  decreased energy 1 3 3 3 3   Change in appetite 0 0 1 0 0  Feeling bad or failure about yourself  0 0 0 0 0  Trouble concentrating 0 0 2 0 0  Moving slowly or fidgety/restless 0 0 0 0 0  Suicidal thoughts 0 0 1 0 0  PHQ-9 Score 1 4 15 4 4   Difficult doing work/chores Not difficult at all Not difficult at all Somewhat difficult Not difficult at all Somewhat difficult  Some recent data might be hidden    phq 9 is negative  Fall Risk: Fall Risk  07/23/2019 05/05/2019 01/13/2019 09/24/2018 04/03/2018  Falls in the past year? - 1 1 0 0  Comment - Feb. 2020 - - -  Number falls in past yr: 0 0 1 0 0  Injury with Fall? 0 0 1 0 0  Risk for fall due to : - - - - -  Risk for fall due to: Comment - - - - -  Follow up - - - - -  Comment - - - - -     Functional Status Survey: Is the patient deaf or have difficulty hearing?: No Does the patient have difficulty seeing, even when wearing glasses/contacts?: Yes Does the patient have difficulty concentrating, remembering, or making decisions?: No Does the patient have difficulty walking or climbing stairs?: No Does the patient have difficulty dressing or bathing?: No Does the patient have difficulty doing errands alone such as visiting a doctor's office or shopping?: No   Assessment & Plan  1. Morbid obesity (HCC)  Discussed with the patient the risk posed by an increased BMI. Discussed importance of portion control, calorie counting and at least 150 minutes of physical activity weekly. Avoid sweet beverages and drink more water. Eat at least 6 servings of fruit and vegetables daily   2. Pure hypercholesterolemia  - rosuvastatin (CRESTOR) 10 MG tablet; Take 1 tablet (10 mg total) by mouth daily. TAKE 1 TABLET(5 MG) BY MOUTH DAILY  Dispense: 90 tablet; Refill: 1  3. Vitamin D deficiency  Continue otc vitamin D   4. Hypertension, benign   - valsartan-hydrochlorothiazide (DIOVAN-HCT) 320-25 MG tablet; Take 1 tablet by mouth daily.  Dispense:  90 tablet; Refill: 1  5. Carotid artery disorder (HCC)  - rosuvastatin (CRESTOR) 10 MG tablet; Take 1 tablet (10 mg total) by mouth daily. TAKE 1 TABLET(5 MG) BY MOUTH DAILY  Dispense: 90 tablet; Refill: 1   6. Pre-diabetes  Resume Rybelsus

## 2019-08-27 ENCOUNTER — Other Ambulatory Visit: Payer: Self-pay | Admitting: Family Medicine

## 2019-08-27 DIAGNOSIS — R7303 Prediabetes: Secondary | ICD-10-CM

## 2019-09-28 ENCOUNTER — Other Ambulatory Visit: Payer: Self-pay | Admitting: Family Medicine

## 2019-09-28 DIAGNOSIS — E7800 Pure hypercholesterolemia, unspecified: Secondary | ICD-10-CM

## 2019-09-28 DIAGNOSIS — I779 Disorder of arteries and arterioles, unspecified: Secondary | ICD-10-CM

## 2019-10-30 DIAGNOSIS — M5416 Radiculopathy, lumbar region: Secondary | ICD-10-CM | POA: Diagnosis not present

## 2019-10-30 DIAGNOSIS — M47896 Other spondylosis, lumbar region: Secondary | ICD-10-CM | POA: Diagnosis not present

## 2019-10-30 DIAGNOSIS — M7062 Trochanteric bursitis, left hip: Secondary | ICD-10-CM | POA: Diagnosis not present

## 2019-11-24 DIAGNOSIS — L508 Other urticaria: Secondary | ICD-10-CM | POA: Diagnosis not present

## 2019-12-07 DIAGNOSIS — M5416 Radiculopathy, lumbar region: Secondary | ICD-10-CM | POA: Diagnosis not present

## 2019-12-07 DIAGNOSIS — M7062 Trochanteric bursitis, left hip: Secondary | ICD-10-CM | POA: Diagnosis not present

## 2019-12-24 DIAGNOSIS — L508 Other urticaria: Secondary | ICD-10-CM | POA: Diagnosis not present

## 2019-12-24 DIAGNOSIS — Z86010 Personal history of colonic polyps: Secondary | ICD-10-CM | POA: Diagnosis not present

## 2019-12-24 DIAGNOSIS — Z800 Family history of malignant neoplasm of digestive organs: Secondary | ICD-10-CM | POA: Diagnosis not present

## 2019-12-24 DIAGNOSIS — K5909 Other constipation: Secondary | ICD-10-CM | POA: Diagnosis not present

## 2020-01-01 ENCOUNTER — Other Ambulatory Visit: Payer: 59

## 2020-01-01 ENCOUNTER — Other Ambulatory Visit: Payer: Self-pay

## 2020-01-01 ENCOUNTER — Other Ambulatory Visit
Admission: RE | Admit: 2020-01-01 | Discharge: 2020-01-01 | Disposition: A | Discharge status: Home / Self Care | Payer: 59 | Source: Ambulatory Visit | Attending: Gastroenterology | Admitting: Gastroenterology

## 2020-01-01 DIAGNOSIS — Z20822 Contact with and (suspected) exposure to covid-19: Secondary | ICD-10-CM | POA: Insufficient documentation

## 2020-01-01 DIAGNOSIS — Z01812 Encounter for preprocedural laboratory examination: Secondary | ICD-10-CM | POA: Diagnosis not present

## 2020-01-02 LAB — SARS CORONAVIRUS 2 (TAT 6-24 HRS): SARS Coronavirus 2: NEGATIVE

## 2020-01-04 ENCOUNTER — Encounter: Payer: Self-pay | Admitting: *Deleted

## 2020-01-05 ENCOUNTER — Ambulatory Visit
Admission: RE | Admit: 2020-01-05 | Discharge: 2020-01-05 | Disposition: A | Discharge status: Home / Self Care | Payer: 59 | Attending: Gastroenterology | Admitting: Gastroenterology

## 2020-01-05 ENCOUNTER — Encounter: Payer: Self-pay | Admitting: *Deleted

## 2020-01-05 ENCOUNTER — Ambulatory Visit: Payer: 59 | Admitting: Anesthesiology

## 2020-01-05 ENCOUNTER — Other Ambulatory Visit: Payer: Self-pay

## 2020-01-05 ENCOUNTER — Encounter
Admission: RE | Disposition: A | Discharge status: Home / Self Care | Payer: Self-pay | Source: Home / Self Care | Attending: Gastroenterology

## 2020-01-05 DIAGNOSIS — Z6841 Body Mass Index (BMI) 40.0 and over, adult: Secondary | ICD-10-CM | POA: Diagnosis not present

## 2020-01-05 DIAGNOSIS — Z1211 Encounter for screening for malignant neoplasm of colon: Secondary | ICD-10-CM | POA: Diagnosis not present

## 2020-01-05 DIAGNOSIS — Z7982 Long term (current) use of aspirin: Secondary | ICD-10-CM | POA: Diagnosis not present

## 2020-01-05 DIAGNOSIS — I739 Peripheral vascular disease, unspecified: Secondary | ICD-10-CM | POA: Diagnosis not present

## 2020-01-05 DIAGNOSIS — Z800 Family history of malignant neoplasm of digestive organs: Secondary | ICD-10-CM | POA: Diagnosis not present

## 2020-01-05 DIAGNOSIS — Z79899 Other long term (current) drug therapy: Secondary | ICD-10-CM | POA: Diagnosis not present

## 2020-01-05 DIAGNOSIS — K635 Polyp of colon: Secondary | ICD-10-CM | POA: Diagnosis not present

## 2020-01-05 DIAGNOSIS — E785 Hyperlipidemia, unspecified: Secondary | ICD-10-CM | POA: Diagnosis not present

## 2020-01-05 DIAGNOSIS — I10 Essential (primary) hypertension: Secondary | ICD-10-CM | POA: Insufficient documentation

## 2020-01-05 DIAGNOSIS — K5730 Diverticulosis of large intestine without perforation or abscess without bleeding: Secondary | ICD-10-CM | POA: Diagnosis not present

## 2020-01-05 DIAGNOSIS — Z86010 Personal history of colonic polyps: Secondary | ICD-10-CM | POA: Insufficient documentation

## 2020-01-05 DIAGNOSIS — Z538 Procedure and treatment not carried out for other reasons: Secondary | ICD-10-CM | POA: Diagnosis not present

## 2020-01-05 DIAGNOSIS — K56699 Other intestinal obstruction unspecified as to partial versus complete obstruction: Secondary | ICD-10-CM | POA: Insufficient documentation

## 2020-01-05 DIAGNOSIS — K5790 Diverticulosis of intestine, part unspecified, without perforation or abscess without bleeding: Secondary | ICD-10-CM | POA: Diagnosis not present

## 2020-01-05 HISTORY — PX: COLONOSCOPY WITH PROPOFOL: SHX5780

## 2020-01-05 SURGERY — COLONOSCOPY WITH PROPOFOL
Anesthesia: General

## 2020-01-05 MED ORDER — PROPOFOL 10 MG/ML IV BOLUS
INTRAVENOUS | Status: DC | PRN
  Administered 2020-01-05: 20 mg via INTRAVENOUS
  Administered 2020-01-05: 30 mg via INTRAVENOUS

## 2020-01-05 MED ORDER — PROPOFOL 500 MG/50ML IV EMUL
INTRAVENOUS | Status: DC | PRN
  Administered 2020-01-05: 150 ug/kg/min via INTRAVENOUS

## 2020-01-05 MED ORDER — SODIUM CHLORIDE 0.9 % IV SOLN: INTRAVENOUS | Status: DC

## 2020-01-05 MED ORDER — LIDOCAINE HCL (CARDIAC) PF 100 MG/5ML IV SOSY
PREFILLED_SYRINGE | INTRAVENOUS | Status: DC | PRN
  Administered 2020-01-05: 100 mg via INTRAVENOUS

## 2020-01-05 NOTE — Interval H&P Note (Signed)
History and Physical Interval Note:  01/05/2020 1:42 PM  Terri Werner  has presented today for surgery, with the diagnosis of ADEN POLYPS FM HX COLON CANCER.  The various methods of treatment have been discussed with the patient and family. After consideration of risks, benefits and other options for treatment, the patient has consented to  Procedure(s): COLONOSCOPY WITH PROPOFOL (N/A) as a surgical intervention.  The patient's history has been reviewed, patient examined, no change in status, stable for surgery.  I have reviewed the patient's chart and labs.  Questions were answered to the patient's satisfaction.     [name]  Ok to proceed with colonoscopy.

## 2020-01-05 NOTE — Anesthesia Postprocedure Evaluation (Signed)
Anesthesia Post Note  Patient: Terri Werner  Procedure(s) Performed: COLONOSCOPY WITH PROPOFOL (N/A )  Patient location during evaluation: Endoscopy Anesthesia Type: General Level of consciousness: awake and alert and oriented Pain management: pain level controlled Vital Signs Assessment: post-procedure vital signs reviewed and stable Respiratory status: spontaneous breathing Cardiovascular status: blood pressure returned to baseline Anesthetic complications: no   No complications documented.   Last Vitals:  Vitals:   01/05/20 1318 01/05/20 1420  BP: (!) 146/88 129/86  Pulse: 66   Resp: 18   Temp: (!) 36.1 C (!) 35.9 C  SpO2: 100%     Last Pain:  Vitals:   01/05/20 1450  TempSrc:   PainSc: 0-No pain                 Chisum Habenicht

## 2020-01-05 NOTE — Anesthesia Procedure Notes (Signed)
Date/Time: 01/05/2020 1:52 PM Performed by: Allean Found, CRNA Pre-anesthesia Checklist: Patient identified, Emergency Drugs available, Suction available, Patient being monitored and Timeout performed Patient Re-evaluated:Patient Re-evaluated prior to induction Oxygen Delivery Method: Nasal cannula Placement Confirmation: positive ETCO2

## 2020-01-05 NOTE — Anesthesia Preprocedure Evaluation (Signed)
Anesthesia Evaluation  Patient identified by MRN, date of birth, ID band Patient awake    Reviewed: Allergy & Precautions, NPO status , Patient's Chart, lab work & pertinent test results  History of Anesthesia Complications Negative for: history of anesthetic complications  Airway Mallampati: III       Dental  (+) Teeth Intact, Dental Advidsory Given   Pulmonary neg pulmonary ROS,    Pulmonary exam normal        Cardiovascular hypertension, Pt. on medications (-) angina+ Peripheral Vascular Disease  (-) Past MI and (-) Cardiac Stents Normal cardiovascular exam(-) dysrhythmias + Valvular Problems/Murmurs      Neuro/Psych negative neurological ROS  negative psych ROS   GI/Hepatic hiatal hernia, GERD  ,(+) Hepatitis -, B  Endo/Other  neg diabetesMorbid obesity  Renal/GU negative Renal ROS  negative genitourinary   Musculoskeletal  (+) Arthritis , Osteoarthritis,    Abdominal (+) + obese,   Peds negative pediatric ROS (+)  Hematology negative hematology ROS (+)   Anesthesia Other Findings Past Medical History: No date: Arthritis     Comment:  pt reports in back & knees 2010: Colon polyps     Comment:  At [facility] 2016: Diverticulitis No date: Genital warts No date: GERD (gastroesophageal reflux disease) No date: Heart murmur No date: Hepatitis B No date: History of blood transfusion     Comment:  during each major surgery per pt No date: History of chicken pox No date: History of hiatal hernia No date: History of torn meniscus of right knee No date: HTN (hypertension) No date: Hx of migraine headaches No date: Hypertension No date: Hypopotassemia   Reproductive/Obstetrics negative OB ROS                             Anesthesia Physical  Anesthesia Plan  ASA: III  Anesthesia Plan: General   Post-op Pain Management:    Induction: Intravenous  PONV Risk Score and Plan: 3 and  Propofol infusion and TIVA  Airway Management Planned: Nasal Cannula and Natural Airway  Additional Equipment:   Intra-op Plan:   Post-operative Plan:   Informed Consent: I have reviewed the patients History and Physical, chart, labs and discussed the procedure including the risks, benefits and alternatives for the proposed anesthesia with the patient or authorized representative who has indicated his/her understanding and acceptance.       Plan Discussed with: CRNA  Anesthesia Plan Comments:         Anesthesia Quick Evaluation

## 2020-01-05 NOTE — Transfer of Care (Signed)
Immediate Anesthesia Transfer of Care Note  Patient: [facility]  Procedure(s) Performed: COLONOSCOPY WITH PROPOFOL (N/A )  Patient Location: PACU  Anesthesia Type:General  Level of Consciousness: sedated  Airway & Oxygen Therapy: Patient Spontanous Breathing and Patient connected to nasal cannula oxygen  Post-op Assessment: Report given to RN and Post -op Vital signs reviewed and stable  Post vital signs: Reviewed and stable  Last Vitals:  Vitals Value Taken Time  BP    Temp    Pulse 76 01/05/20 1423  Resp 21 01/05/20 1424  SpO2 96 % 01/05/20 1423  Vitals shown include unvalidated device data.  Last Pain:  Vitals:   01/05/20 1420  TempSrc: Temporal  PainSc: Asleep         Complications: No complications documented.

## 2020-01-05 NOTE — Op Note (Signed)
[facility] Patient Name: Terri Werner Procedure Date: 01/05/2020 1:44 PM MRN: [id number] Account #: [id number] Date of Birth: [birth year] Admit Type: Outpatient Age: 69 Room: [facility] ENDO ROOM 3 Gender: Female Note Status: Finalized Procedure:             Colonoscopy Indications:           High risk colon cancer surveillance: Personal history                         of colonic polyps, Family history of colon cancer in a                         distant relative Providers:             Eather Colas MD, MD Referring MD:          Onnie Boer. Sowles, MD (Referring MD) Medicines:             Monitored Anesthesia Care Complications:         No immediate complications. Procedure:             Pre-Anesthesia Assessment:                        - Prior to the procedure, a History and Physical was                         performed, and patient medications and allergies were                         reviewed. The patient is competent. The risks and                         benefits of the procedure and the sedation options and                         risks were discussed with the patient. All questions                         were answered and informed consent was obtained.                         Patient identification and proposed procedure were                         verified by the physician, the nurse, the anesthetist                         and the technician in the endoscopy suite. Mental                         Status Examination: alert and oriented. Airway                         Examination: normal oropharyngeal airway and neck                         mobility. Respiratory Examination: clear to  auscultation. CV Examination: normal. Prophylactic                         Antibiotics: The patient does not require prophylactic                         antibiotics. Prior Anticoagulants: The patient has                          taken no previous anticoagulant or antiplatelet                         agents. ASA Grade Assessment: III - A patient with                         severe systemic disease. After reviewing the risks and                         benefits, the patient was deemed in satisfactory                         condition to undergo the procedure. The anesthesia                         plan was to use monitored anesthesia care (MAC).                         Immediately prior to administration of medications,                         the patient was re-assessed for adequacy to receive                         sedatives. The heart rate, respiratory rate, oxygen                         saturations, blood pressure, adequacy of pulmonary                         ventilation, and response to care were monitored                         throughout the procedure. The physical status of the                         patient was re-assessed after the procedure.                        After obtaining informed consent, the colonoscope was                         passed under direct vision. Throughout the procedure,                         the patient's blood pressure, pulse, and oxygen                         saturations were monitored continuously. The  Colonoscope was introduced through the anus with the                         intention of advancing to the cecum. The scope was                         advanced to the sigmoid colon before the procedure was                         aborted. Medications were given. The colonoscopy was                         technically difficult and complex due to restricted                         mobility of the colon. The patient tolerated the                         procedure well. The quality of the bowel preparation                         was good. Findings:      The perianal and digital rectal examinations were normal.      A benign-appearing, intrinsic  severe stenosis was found in the sigmoid       colon and was non-traversed.      Multiple small-mouthed diverticula were found in the sigmoid colon. Impression:            - Stricture in the sigmoid colon.                        - Diverticulosis in the sigmoid colon.                        - No specimens collected. Recommendation:        - Discharge patient to home.                        - Resume previous diet.                        - Continue present medications.                        - Perform CT scan (computed tomography) of the abdomen                         with contrast at the next available appointment. If no                         pathology, will discuss next steps at completing                         colonoscopy. Procedure Code(s):     --- Professional ---                        Z6109, 53, Colorectal cancer screening; colonoscopy on  individual at high risk Diagnosis Code(s):     --- Professional ---                        Z86.010, Personal history of colonic polyps                        K56.699, Other intestinal obstruction unspecified as                         to partial versus complete obstruction                        Z80.0, Family history of malignant neoplasm of                         digestive organs CPT copyright 2019 [employer]. All rights reserved. The codes documented in this report are preliminary and upon coder review may  be revised to meet current compliance requirements. Eather Colas, MD Eather Colas MD, MD 01/05/2020 2:18:47 PM Number of Addenda: 0 Note Initiated On: 01/05/2020 1:44 PM Total Procedure Duration: 0 hours 20 minutes 12 seconds  Estimated Blood Loss:  Estimated blood loss: none.      [facility]

## 2020-01-05 NOTE — H&P (Signed)
Outpatient short stay form Pre-procedure 01/05/2020 1:40 PM Raylene Miyamoto MD, MPH  Primary Physician: Dr. Ancil Boozer  Reason for visit:  Surveillance  History of present illness:   69 y/o lady with reported history of polyps and family history of colon cancer in grandmother. Here for colonoscopy. No blood thinners. History of hysterectomy.    Current Facility-Administered Medications:  .  0.9 %  sodium chloride infusion, , Intravenous, Continuous, Zacharias Ridling, Hilton Cork, MD, Last Rate: 20 mL/hr at 01/05/20 1337, New Bag at 01/05/20 1337  Medications Prior to Admission  Medication Sig Dispense Refill Last Dose  . amLODipine (NORVASC) 2.5 MG tablet Take 2.5 mg by mouth daily.   Past Week at Unknown time  . aspirin 81 MG tablet Take 81 mg by mouth daily.   Past Week at Unknown time  . Cholecalciferol (VITAMIN D) 2000 UNITS CAPS Take 2,000 Units by mouth daily.   Past Week at Unknown time  . Coenzyme Q10 10 MG capsule Take 10 mg by mouth daily.   Past Week at Unknown time  . fluticasone (FLONASE) 50 MCG/ACT nasal spray Place 2 sprays into both nostrils daily. 48 g 1 01/04/2020 at Unknown time  . Magnesium (V-R MAGNESIUM) 250 MG TABS Take by mouth.   Past Month at Unknown time  . rosuvastatin (CRESTOR) 10 MG tablet Take 1 tablet (10 mg total) by mouth daily. TAKE 1 TABLET(5 MG) BY MOUTH DAILY 90 tablet 1 Past Week at Unknown time  . valsartan-hydrochlorothiazide (DIOVAN-HCT) 320-25 MG tablet Take 1 tablet by mouth daily. 90 tablet 1 Past Week at Unknown time  . acetaminophen (TYLENOL) 500 MG tablet Take 500 mg by mouth every 6 (six) hours as needed.     . diclofenac sodium (VOLTAREN) 1 % GEL Apply 2 g topically 4 (four) times daily. Uses a couple time a week 100 g 2   . lidocaine (LIDODERM) 5 % Place 3 patches onto the skin every 12 (twelve) hours as needed. Remove & Discard patch within 12 hours (Patient not taking: Reported on 01/05/2020) 90 patch 2 Not Taking at Unknown time  . Semaglutide  (RYBELSUS) 7 MG TABS Take 1 tablet by mouth every morning. (Patient not taking: Reported on 01/05/2020) 30 tablet 5 Not Taking at Unknown time     Allergies  Allergen Reactions  . Shrimp [Shellfish Allergy] Swelling    Lips will burn   . Latex Itching    Burning  . Lisinopril Other (See Comments)    cough  . Pravastatin Other (See Comments)    Muscle ache      Past Medical History:  Diagnosis Date  . Arthritis    pt reports in back & knees  . Colon polyps 2010   At [facility]  . Diverticulitis 2016  . Genital warts   . GERD (gastroesophageal reflux disease)   . Heart murmur   . Hepatitis B   . History of blood transfusion    during each major surgery per pt  . History of chicken pox   . History of hiatal hernia   . History of torn meniscus of right knee   . HTN (hypertension)   . Hx of migraine headaches   . Hypertension   . Hypopotassemia     Review of systems:  Otherwise negative.    Physical Exam  Gen: Alert, oriented. Appears stated age.  HEENT: [place]/AT. PERRLA. Lungs: No respiratory distress Abd: soft, benign, no masses. BS+ Ext: No edema.     Planned procedures: Proceed  with colonoscopy. The patient understands the nature of the planned procedure, indications, risks, alternatives and potential complications including but not limited to bleeding, infection, perforation, damage to internal organs and possible oversedation/side effects from anesthesia. The patient agrees and gives consent to proceed.  Please refer to procedure notes for findings, recommendations and patient disposition/instructions.     Raylene Miyamoto MD, MPH Gastroenterology 01/05/2020  1:40 PM

## 2020-01-06 ENCOUNTER — Encounter: Payer: Self-pay | Admitting: Gastroenterology

## 2020-01-07 ENCOUNTER — Other Ambulatory Visit: Payer: Self-pay | Admitting: Gastroenterology

## 2020-01-07 DIAGNOSIS — K56699 Other intestinal obstruction unspecified as to partial versus complete obstruction: Secondary | ICD-10-CM

## 2020-01-25 ENCOUNTER — Encounter: Payer: 59 | Admitting: Family Medicine

## 2020-01-26 ENCOUNTER — Ambulatory Visit
Admission: RE | Admit: 2020-01-26 | Discharge: 2020-01-26 | Disposition: A | Discharge status: Home / Self Care | Payer: 59 | Source: Ambulatory Visit | Attending: Gastroenterology | Admitting: Gastroenterology

## 2020-01-26 ENCOUNTER — Other Ambulatory Visit: Payer: Self-pay

## 2020-01-26 DIAGNOSIS — K56699 Other intestinal obstruction unspecified as to partial versus complete obstruction: Secondary | ICD-10-CM | POA: Diagnosis not present

## 2020-01-26 DIAGNOSIS — K5730 Diverticulosis of large intestine without perforation or abscess without bleeding: Secondary | ICD-10-CM | POA: Diagnosis not present

## 2020-01-26 DIAGNOSIS — K429 Umbilical hernia without obstruction or gangrene: Secondary | ICD-10-CM | POA: Diagnosis not present

## 2020-01-26 DIAGNOSIS — K529 Noninfective gastroenteritis and colitis, unspecified: Secondary | ICD-10-CM | POA: Diagnosis not present

## 2020-01-26 LAB — POCT I-STAT CREATININE: Creatinine, Ser: 1 mg/dL (ref 0.44–1.00)

## 2020-01-26 MED ORDER — IOHEXOL 300 MG/ML  SOLN
125.0000 mL | Freq: Once | INTRAMUSCULAR | Status: AC | PRN
  Administered 2020-01-26: 125 mL via INTRAVENOUS

## 2020-08-03 ENCOUNTER — Other Ambulatory Visit: Payer: Self-pay | Admitting: Family Medicine

## 2020-08-03 DIAGNOSIS — I10 Essential (primary) hypertension: Secondary | ICD-10-CM

## 2020-08-03 NOTE — Telephone Encounter (Signed)
Requested medication (s) are due for refill today: yes  Requested medication (s) are on the active medication list: yes  Last refill: 07/04/2020  Future visit scheduled:no  Notes to clinic:  overdue for 6 month follow up appointment 30 day supply already given  Message sent to patient to contact office for appt    Requested Prescriptions  Pending Prescriptions Disp Refills   valsartan-hydrochlorothiazide (DIOVAN-HCT) 320-25 MG tablet [Pharmacy Med Name: VALSARTAN/HCTZ 320MG /25MG  TABLETS] 90 tablet 1    Sig: Take 1 tablet by mouth daily.      Cardiovascular: ARB + Diuretic Combos Failed - 08/03/2020  6:29 AM      Failed - K in normal range and within 180 days    Potassium  Date Value Ref Range Status  01/13/2019 3.9 3.5 - 5.3 mmol/L Final  07/03/2012 3.7 3.5 - 5.1 mmol/L Final          Failed - Na in normal range and within 180 days    Sodium  Date Value Ref Range Status  01/13/2019 140 135 - 146 mmol/L Final  07/03/2012 141 136 - 145 mmol/L Final          Failed - Cr in normal range and within 180 days    Creat  Date Value Ref Range Status  01/13/2019 1.11 (H) 0.50 - 0.99 mg/dL Final    Comment:    For patients >34 years of age, the reference limit for Creatinine is approximately 13% higher for people identified as African-American. .    Creatinine, Ser  Date Value Ref Range Status  01/26/2020 1.00 0.44 - 1.00 mg/dL Final   Creatinine,U  Date Value Ref Range Status  04/15/2014 88.2 mg/dL Final          Failed - Ca in normal range and within 180 days    Calcium  Date Value Ref Range Status  01/13/2019 10.0 8.6 - 10.4 mg/dL Final   Calcium, Total  Date Value Ref Range Status  07/03/2012 9.0 8.5 - 10.1 mg/dL Final          Failed - Valid encounter within last 6 months    Recent Outpatient Visits           1 year ago Morbid obesity [facility])   [facility] Steele Sizer, MD   1 year ago Episode of dizziness   [facility] Delsa Grana, PA-C   1 year ago Major depression, recurrent, chronic [facility])   [facility] Steele Sizer, MD   1 year ago Morbid obesity [facility])   [facility] Steele Sizer, MD   1 year ago Morbid obesity [facility])   [facility] [place], Drue Stager, MD                Passed - Patient is not pregnant      Passed - Last BP in normal range    BP Readings from Last 1 Encounters:  01/05/20 129/86

## 2020-09-08 ENCOUNTER — Other Ambulatory Visit: Payer: Self-pay | Admitting: Obstetrics and Gynecology

## 2020-09-08 DIAGNOSIS — N644 Mastodynia: Secondary | ICD-10-CM

## 2020-09-09 ENCOUNTER — Ambulatory Visit: Payer: 59 | Admitting: Family Medicine

## 2020-10-01 ENCOUNTER — Other Ambulatory Visit: Payer: Self-pay

## 2020-10-01 ENCOUNTER — Ambulatory Visit
Admission: RE | Admit: 2020-10-01 | Discharge: 2020-10-01 | Disposition: A | Discharge status: Home / Self Care | Payer: 59 | Source: Ambulatory Visit | Attending: Obstetrics and Gynecology | Admitting: Obstetrics and Gynecology

## 2020-10-01 ENCOUNTER — Ambulatory Visit
Admission: RE | Admit: 2020-10-01 | Discharge: 2020-10-01 | Disposition: A | Discharge status: Home / Self Care | Payer: Managed Care, Other (non HMO) | Source: Ambulatory Visit | Attending: Obstetrics and Gynecology | Admitting: Obstetrics and Gynecology

## 2020-10-01 DIAGNOSIS — N644 Mastodynia: Secondary | ICD-10-CM

## 2020-10-14 ENCOUNTER — Other Ambulatory Visit: Payer: 59

## 2020-11-13 ENCOUNTER — Emergency Department
Admission: EM | Admit: 2020-11-13 | Discharge: 2020-11-13 | Disposition: A | Discharge status: Home / Self Care | Payer: Managed Care, Other (non HMO) | Attending: Emergency Medicine | Admitting: Emergency Medicine

## 2020-11-13 ENCOUNTER — Other Ambulatory Visit: Payer: Self-pay

## 2020-11-13 ENCOUNTER — Emergency Department: Payer: Managed Care, Other (non HMO)

## 2020-11-13 DIAGNOSIS — Z91040 Latex allergy status: Secondary | ICD-10-CM | POA: Insufficient documentation

## 2020-11-13 DIAGNOSIS — Z79899 Other long term (current) drug therapy: Secondary | ICD-10-CM | POA: Insufficient documentation

## 2020-11-13 DIAGNOSIS — Z7982 Long term (current) use of aspirin: Secondary | ICD-10-CM | POA: Diagnosis not present

## 2020-11-13 DIAGNOSIS — R509 Fever, unspecified: Secondary | ICD-10-CM | POA: Diagnosis present

## 2020-11-13 DIAGNOSIS — U071 COVID-19: Secondary | ICD-10-CM

## 2020-11-13 DIAGNOSIS — I10 Essential (primary) hypertension: Secondary | ICD-10-CM | POA: Diagnosis not present

## 2020-11-13 MED ORDER — AZITHROMYCIN 250 MG PO TABS: ORAL_TABLET | ORAL | 0 refills | Status: AC

## 2020-11-13 MED ORDER — ACETAMINOPHEN 500 MG PO TABS
ORAL_TABLET | ORAL | Status: AC
  Filled 2020-11-13: qty 2

## 2020-11-13 MED ORDER — NIRMATRELVIR/RITONAVIR (PAXLOVID)TABLET: 3.0000 | ORAL_TABLET | Freq: Two times a day (BID) | ORAL | 0 refills | Status: AC

## 2020-11-13 MED ORDER — BENZONATATE 100 MG PO CAPS: 100.0000 mg | ORAL_CAPSULE | Freq: Three times a day (TID) | ORAL | 0 refills | Status: DC | PRN

## 2020-11-13 MED ORDER — ACETAMINOPHEN 500 MG PO TABS
1000.0000 mg | ORAL_TABLET | Freq: Once | ORAL | Status: AC
  Administered 2020-11-13: 17:00:00 1000 mg via ORAL

## 2020-11-13 NOTE — Discharge Instructions (Addendum)
Please treat fever and body aches alternating tylenol and ibuprofen. Use Paxlovid, an antiviral prescribed to you for Covid. Please use Azithromycin, an antibiotic prescribed to you for your pneumonia. Return to the ER with any worsening of symptoms.

## 2020-11-13 NOTE — ED Provider Notes (Signed)
[facility] Emergency Department Provider Note  ____________________________________________   Event Date/Time   First MD Initiated Contact with Patient 11/13/20 1853     (approximate)  I have reviewed the triage vital signs and the nursing notes.   HISTORY  Chief Complaint Covid Positive   HPI Terri Werner is a 70 y.o. female who presents to the ER for evaluation of body aches, fevers and generalized fatigue since Friday. Tested 3x with home test for Covid and found last PM to be faintly positive and this morning to be grossly positive. Denies any current chest pain, shortness of breath. Has had vomiting x 1 this AM. Has been treating fever at home with Tylenol.       Past Medical History:  Diagnosis Date   Arthritis    pt reports in back & knees   Colon polyps 2010   At [facility]   Diverticulitis 2016   Genital warts    GERD (gastroesophageal reflux disease)    Heart murmur    Hepatitis B    History of blood transfusion    during each major surgery per pt   History of chicken pox    History of hiatal hernia    History of torn meniscus of right knee    HTN (hypertension)    Hx of migraine headaches    Hypertension    Hypopotassemia     Patient Active Problem List   Diagnosis Date Noted   Morbid obesity ([place]) 09/24/2018   Unspecified inflammatory spondylopathy, lumbar region ([place]) 03/05/2018   Lumbar spondylolysis 08/20/2017   Lumbar facet arthropathy 08/20/2017   Lumbar degenerative disc disease 08/20/2017   Chronic constipation 07/30/2017   Primary osteoarthritis involving multiple joints 04/17/2017   Postmenopausal 10/12/2016   Prediabetes 10/12/2016   Lumbago 08/04/2015   Diverticulitis of colon 07/05/2014   Carotid artery disorder ([place]) 04/27/2014   Hyperlipidemia 10/08/2013   Arthralgia 06/22/2011   Hypertension, benign 11/19/2009    Past Surgical History:  Procedure Laterality Date   ABDOMINAL HYSTERECTOMY  2006    [place]   COLONOSCOPY WITH PROPOFOL N/A 11/01/2014   Procedure: COLONOSCOPY WITH PROPOFOL;  Surgeon: Manya Silvas, MD;  Location: [facility] ENDOSCOPY;  Service: Endoscopy;  Laterality: N/A;   COLONOSCOPY WITH PROPOFOL N/A 01/05/2020   Procedure: COLONOSCOPY WITH PROPOFOL;  Surgeon: [name], MD;  Location: [facility] ENDOSCOPY;  Service: Endoscopy;  Laterality: N/A;   [place]   HERNIA REPAIR  5361   Umbilical   KNEE ARTHROSCOPY Left 2006   torn meniscus   TONSILLECTOMY  1971   TOTAL ABDOMINAL HYSTERECTOMY W/ BILATERAL SALPINGOOPHORECTOMY  2006   VESICOVAGINAL FISTULA CLOSURE W/ TAH      Prior to Admission medications   Medication Sig Start Date End Date Taking? Authorizing Provider  azithromycin (ZITHROMAX Z-PAK) 250 MG tablet Take 2 tablets (500 mg) on  Day 1,  followed by 1 tablet (250 mg) once daily on Days 2 through 5. 11/13/20 11/18/20 Yes Adeola Dennen, Farrel Gordon, PA  benzonatate (TESSALON PERLES) 100 MG capsule Take 1 capsule (100 mg total) by mouth 3 (three) times daily as needed for cough. 11/13/20 11/13/21 Yes Trey Bebee, Farrel Gordon, PA  nirmatrelvir/ritonavir EUA (PAXLOVID) TABS Take 3 tablets by mouth 2 (two) times daily for 5 days. Take nirmatrelvir (150 mg) two tablets twice daily for 5 days and ritonavir (100 mg) one tablet twice daily for 5 days. 11/13/20 11/18/20 Yes Ordell Prichett,  Farrel Gordon, PA  acetaminophen (TYLENOL) 500 MG tablet Take 500 mg by mouth every 6 (six) hours as needed.    [provider]  amLODipine (NORVASC) 2.5 MG tablet Take 2.5 mg by mouth daily.    [provider]  aspirin 81 MG tablet Take 81 mg by mouth daily.    [provider]  Cholecalciferol (VITAMIN D) 2000 UNITS CAPS Take 2,000 Units by mouth daily.    [provider]  Coenzyme Q10 10 MG capsule Take 10 mg by mouth daily.    [provider]  diclofenac sodium (VOLTAREN) 1 % GEL Apply 2 g topically 4 (four) times  daily. Uses a couple time a week 09/24/18   Steele Sizer, MD  fluticasone [facility]) 50 MCG/ACT nasal spray Place 2 sprays into both nostrils daily. 01/13/19   Steele Sizer, MD  lidocaine (LIDODERM) 5 % Place 3 patches onto the skin every 12 (twelve) hours as needed. Remove & Discard patch within 12 hours Patient not taking: Reported on 01/05/2020 09/24/18   Steele Sizer, MD  Magnesium (V-R MAGNESIUM) 250 MG TABS Take by mouth.    [provider]  rosuvastatin (CRESTOR) 10 MG tablet Take 1 tablet (10 mg total) by mouth daily. TAKE 1 TABLET(5 MG) BY MOUTH DAILY 07/23/19   Sowles, Drue Stager, MD  Semaglutide (RYBELSUS) 7 MG TABS Take 1 tablet by mouth every morning. Patient not taking: Reported on 01/05/2020 07/23/19   Steele Sizer, MD  valsartan-hydrochlorothiazide (DIOVAN-HCT) 320-25 MG tablet TAKE 1 TABLET BY MOUTH DAILY 08/03/20   Steele Sizer, MD    Allergies Shrimp [shellfish allergy], Latex, Lisinopril, and Pravastatin  Family History  Problem Relation Age of Onset   Breast cancer Sister 72   Hypertension Mother    Alzheimer's disease Mother    Dementia Mother    Hypertension Maternal Grandfather    Hyperlipidemia Maternal Grandfather    Birth defects Maternal Grandmother        Bladder   Colon cancer Maternal Grandmother    Congestive Heart Failure Father    Hyperlipidemia Father    Dementia Paternal Grandmother    Dementia Paternal Grandfather    Thyroid disease Sister     Social History Social History   Tobacco Use   Smoking status: Never   Smokeless tobacco: Never  Vaping Use   Vaping Use: Never used  Substance Use Topics   Alcohol use: Yes    Comment: rare   Drug use: No    Review of Systems Constitutional: +fever, +body aches Eyes: No visual changes. ENT: No sore throat. Cardiovascular: Denies chest pain. Respiratory: Denies shortness of breath. Gastrointestinal: No abdominal pain.  No nausea, + vomiting.  No diarrhea.  No  constipation. Genitourinary: Negative for dysuria. Musculoskeletal: Negative for back pain. Skin: Negative for rash. Neurological: Negative for headaches, focal weakness or numbness.  ____________________________________________   PHYSICAL EXAM:  VITAL SIGNS: ED Triage Vitals [11/13/20 1644]  Enc Vitals Group     BP (S) (!) 176/84     Pulse Rate (!) 105     Resp 18     Temp (!) 103 F (39.4 C)     Temp Source Oral     SpO2 96 %     Weight 225 lb (102.1 kg)     Height 5\' 2"  (1.575 m)     Head Circumference      Peak Flow      Pain Score 8     Pain Loc  Pain Edu?      Excl. in [place]?    Constitutional: Alert and oriented. Well appearing and in no acute distress. Eyes: Conjunctivae are normal. PERRL. EOMI. Head: Atraumatic. Nose: No congestion/rhinnorhea. Mouth/Throat: Mucous membranes are moist.  Oropharynx non-erythematous. Neck: No stridor.  Cardiovascular: Normal rate, regular rhythm. Grossly normal heart sounds.  Good peripheral circulation. Respiratory: Normal respiratory effort.  No retractions. Lungs CTAB. Gastrointestinal: Soft and nontender. No distention. No abdominal bruits. No CVA tenderness. Musculoskeletal: No lower extremity tenderness nor edema.  No joint effusions. Neurologic:  Normal speech and language. No gross focal neurologic deficits are appreciated. No gait instability. Skin:  Skin is warm, dry and intact. No rash noted. Psychiatric: Mood and affect are normal. Speech and behavior are normal.  ____________________________________________  RADIOLOGY I, Marlana Salvage, personally viewed and evaluated these images (plain radiographs) as part of my medical decision making, as well as reviewing the written report by the radiologist.  ED provider interpretation:  questionable early pneumonia on left lower lobe vs atelectasis; evidence of peribronchial thickening concerning for viral infection  Official radiology report(s): DG Chest 2  View  Result Date: 11/13/2020 CLINICAL DATA:  COVID positive.  Shortness of breath. EXAM: CHEST - 2 VIEW COMPARISON:  06/22/2014 FINDINGS: Normal heart size. Aortic atherosclerosis and tortuosity. Normal lung volumes. Peribronchial thickening. Ill-defined streaky opacity at the left lung base. No confluent airspace disease. No pleural effusion or pneumothorax. Patient's chin obscures the apices, patient had difficulty tolerating the exam. No acute osseous abnormalities are seen. IMPRESSION: Peribronchial thickening. Ill-defined streaky opacity at the left lung base, atelectasis versus pneumonia. Electronically Signed   By: Keith Rake M.D.   On: 11/13/2020 17:17      ____________________________________________   INITIAL IMPRESSION / ASSESSMENT AND PLAN / ED COURSE  As part of my medical decision making, I reviewed the following data within the [place] notes reviewed and incorporated, Radiograph reviewed, and Notes from prior ED visits        Patient is a 70 yo female who reports to the ER for evaluation of +covid status at home. Reports fever and body aches, denies SOB, chest pain at present. Reports associarted cough and vomiting x 1. See HPI. In triage, patient is mildly tachycardic and febrile at 103. Improvement in temp with Tylenol, which also resulted in decreased pulse. PE grossly unremarkable. CXR with possible early pneumonia. Pt is in window for treatment with paxlovid, has no known hx of renal insufficiency. Also will treat with azithro for PNA. Return precautions were discussed at length and patient is stable at this time for outpatient follow up.       ____________________________________________   FINAL CLINICAL IMPRESSION(S) / ED DIAGNOSES  Final diagnoses:  [place]     ED Discharge Orders          Ordered    nirmatrelvir/ritonavir EUA (PAXLOVID) TABS  2 times daily        11/13/20 1856    azithromycin (ZITHROMAX Z-PAK) 250 MG tablet         11/13/20 1856    benzonatate (TESSALON PERLES) 100 MG capsule  3 times daily PRN        11/13/20 1856             Note:  This document was prepared using Dragon voice recognition software and may include unintentional dictation errors.    Marlana Salvage, PA 11/13/20 Karin Golden, MD 11/13/20 [phone]

## 2020-11-13 NOTE — ED Triage Notes (Signed)
Pt comes pov with covid pos test. States "I need to be seen. I have been dx with covid." Pt had tylenol last at 10am.

## 2021-02-17 ENCOUNTER — Telehealth: Payer: Managed Care, Other (non HMO) | Admitting: Physician Assistant

## 2021-02-17 DIAGNOSIS — R3989 Other symptoms and signs involving the genitourinary system: Secondary | ICD-10-CM

## 2021-02-17 MED ORDER — SULFAMETHOXAZOLE-TRIMETHOPRIM 800-160 MG PO TABS: 1.0000 | ORAL_TABLET | Freq: Two times a day (BID) | ORAL | 0 refills | Status: DC

## 2021-02-17 NOTE — Progress Notes (Signed)
Virtual Visit Consent   Terri Werner, you are scheduled for a virtual visit with a [place] provider today.     Just as with appointments in the office, your consent must be obtained to participate.  Your consent will be active for this visit and any virtual visit you may have with one of our providers in the next 365 days.     If you have a MyChart account, a copy of this consent can be sent to you electronically.  All virtual visits are billed to your insurance company just like a traditional visit in the office.    As this is a virtual visit, video technology does not allow for your provider to perform a traditional examination.  This may limit your provider's ability to fully assess your condition.  If your provider identifies any concerns that need to be evaluated in person or the need to arrange testing (such as labs, EKG, etc.), we will make arrangements to do Terri.     Although advances in technology are sophisticated, we cannot ensure that it will always work on either your end or our end.  If the connection with a video visit is poor, the visit may have to be switched to a telephone visit.  With either a video or telephone visit, we are not always able to ensure that we have a secure connection.     I need to obtain your verbal consent now.   Are you willing to proceed with your visit today?    Terri Werner has provided verbal consent on 02/17/2021 for a virtual visit (video or telephone).   Margaretann Loveless, PA-C   Date: 02/17/2021 3:45 PM   Virtual Visit via Video Note   I, Margaretann Loveless, connected with  Terri Werner  ([id number], 1950-08-20) on 02/17/21 at  3:30 PM EDT by a video-enabled telemedicine application and verified that I am speaking with the correct person using two identifiers.  Location: Patient: Virtual Visit Location Patient: Home Provider: Virtual Visit Location Provider: Home Office   I discussed the limitations  of evaluation and management by telemedicine and the availability of in person appointments. The patient expressed understanding and agreed to proceed.    History of Present Illness: Terri Werner is a 70 y.o. who identifies as a female who was assigned female at birth, and is being seen today for possible UTI.  HPI: Urinary Tract Infection  This is a new problem. The current episode started in the past 7 days. The problem occurs every urination. The problem has been gradually worsening. The quality of the pain is described as burning and shooting. The pain is moderate. There has been no fever. Associated symptoms include flank pain (felt a little pain on the right abdominal side after one urination), frequency and urgency. Pertinent negatives include no chills, discharge, hematuria, hesitancy, nausea or vomiting. She has tried acetaminophen, increased fluids and NSAIDs (AZO) for the symptoms. The treatment provided no relief.     Problems:  Patient Active Problem List   Diagnosis Date Noted   Morbid obesity (HCC) 09/24/2018   Unspecified inflammatory spondylopathy, lumbar region ([facility]) 03/05/2018   Lumbar spondylolysis 08/20/2017   Lumbar facet arthropathy 08/20/2017   Lumbar degenerative disc disease 08/20/2017   Chronic constipation 07/30/2017   Primary osteoarthritis involving multiple joints 04/17/2017   Postmenopausal 10/12/2016   Prediabetes 10/12/2016   Lumbago 08/04/2015   Diverticulitis of colon 07/05/2014   Carotid artery disorder (H[facility]) 04/27/2014  Hyperlipidemia 10/08/2013   Arthralgia 06/22/2011   Hypertension, benign 11/19/2009    Allergies:  Allergies  Allergen Reactions   Shrimp [Shellfish Allergy] Swelling    Lips will burn    Latex Itching    Burning   Lisinopril Other (See Comments)    cough   Pravastatin Other (See Comments)    Muscle ache    Medications:  Current Outpatient Medications:    sulfamethoxazole-trimethoprim (BACTRIM DS) 800-160  MG tablet, Take 1 tablet by mouth 2 (two) times daily., Disp: 10 tablet, Rfl: 0   acetaminophen (TYLENOL) 500 MG tablet, Take 500 mg by mouth every 6 (six) hours as needed., Disp: , Rfl:    amLODipine (NORVASC) 2.5 MG tablet, Take 2.5 mg by mouth daily., Disp: , Rfl:    aspirin 81 MG tablet, Take 81 mg by mouth daily., Disp: , Rfl:    benzonatate (TESSALON PERLES) 100 MG capsule, Take 1 capsule (100 mg total) by mouth 3 (three) times daily as needed for cough., Disp: 30 capsule, Rfl: 0   Cholecalciferol (VITAMIN D) 2000 UNITS CAPS, Take 2,000 Units by mouth daily., Disp: , Rfl:    Coenzyme Q10 10 MG capsule, Take 10 mg by mouth daily., Disp: , Rfl:    diclofenac sodium (VOLTAREN) 1 % GEL, Apply 2 g topically 4 (four) times daily. Uses a couple time a week, Disp: 100 g, Rfl: 2   fluticasone (FLONASE) 50 MCG/ACT nasal spray, Place 2 sprays into both nostrils daily., Disp: 48 g, Rfl: 1   lidocaine (LIDODERM) 5 %, Place 3 patches onto the skin every 12 (twelve) hours as needed. Remove & Discard patch within 12 hours (Patient not taking: Reported on 01/05/2020), Disp: 90 patch, Rfl: 2   Magnesium (V-R MAGNESIUM) 250 MG TABS, Take by mouth., Disp: , Rfl:    rosuvastatin (CRESTOR) 10 MG tablet, Take 1 tablet (10 mg total) by mouth daily. TAKE 1 TABLET(5 MG) BY MOUTH DAILY, Disp: 90 tablet, Rfl: 1   Semaglutide (RYBELSUS) 7 MG TABS, Take 1 tablet by mouth every morning. (Patient not taking: Reported on 01/05/2020), Disp: 30 tablet, Rfl: 5   valsartan-hydrochlorothiazide (DIOVAN-HCT) 320-25 MG tablet, TAKE 1 TABLET BY MOUTH DAILY, Disp: 90 tablet, Rfl: 0  Observations/Objective: Patient is well-developed, well-nourished in no acute distress.  Resting comfortably at home.  Head is normocephalic, atraumatic.  No labored breathing.  Speech is clear and coherent with logical content.  Patient is alert and oriented at baseline.    Assessment and Plan: 1. Suspected UTI - sulfamethoxazole-trimethoprim  (BACTRIM DS) 800-160 MG tablet; Take 1 tablet by mouth 2 (two) times daily.  Dispense: 10 tablet; Refill: 0  - Worsening symptoms.  - Will treat empirically with Bactrim  - May continue AZO PRN - Continue to push fluids.  - She is to call or seek in person evaluation if symptoms do not improve or if they worsen.    Follow Up Instructions: I discussed the assessment and treatment plan with the patient. The patient was provided an opportunity to ask questions and all were answered. The patient agreed with the plan and demonstrated an understanding of the instructions.  A copy of instructions were sent to the patient via MyChart unless otherwise noted below.    The patient was advised to call back or seek an in-person evaluation if the symptoms worsen or if the condition fails to improve as anticipated.  Time:  I spent 10 minutes with the patient via telehealth technology discussing the above problems/concerns.  Margaretann Loveless, PA-C

## 2021-02-17 NOTE — Patient Instructions (Signed)
Terri Werner, thank you for joining Mar Daring, PA-C for today's virtual visit.  While this provider is not your primary care provider (PCP), if your PCP is located in our provider database this encounter information will be shared with them immediately following your visit.  Consent: (Patient) Terri Werner provided verbal consent for this virtual visit at the beginning of the encounter.  Current Medications:  Current Outpatient Medications:    sulfamethoxazole-trimethoprim (BACTRIM DS) 800-160 MG tablet, Take 1 tablet by mouth 2 (two) times daily., Disp: 10 tablet, Rfl: 0   acetaminophen (TYLENOL) 500 MG tablet, Take 500 mg by mouth every 6 (six) hours as needed., Disp: , Rfl:    amLODipine (NORVASC) 2.5 MG tablet, Take 2.5 mg by mouth daily., Disp: , Rfl:    aspirin 81 MG tablet, Take 81 mg by mouth daily., Disp: , Rfl:    benzonatate (TESSALON PERLES) 100 MG capsule, Take 1 capsule (100 mg total) by mouth 3 (three) times daily as needed for cough., Disp: 30 capsule, Rfl: 0   Cholecalciferol (VITAMIN D) 2000 UNITS CAPS, Take 2,000 Units by mouth daily., Disp: , Rfl:    Coenzyme Q10 10 MG capsule, Take 10 mg by mouth daily., Disp: , Rfl:    diclofenac sodium (VOLTAREN) 1 % GEL, Apply 2 g topically 4 (four) times daily. Uses a couple time a week, Disp: 100 g, Rfl: 2   fluticasone (FLONASE) 50 MCG/ACT nasal spray, Place 2 sprays into both nostrils daily., Disp: 48 g, Rfl: 1   lidocaine (LIDODERM) 5 %, Place 3 patches onto the skin every 12 (twelve) hours as needed. Remove & Discard patch within 12 hours (Patient not taking: Reported on 01/05/2020), Disp: 90 patch, Rfl: 2   Magnesium (V-R MAGNESIUM) 250 MG TABS, Take by mouth., Disp: , Rfl:    rosuvastatin (CRESTOR) 10 MG tablet, Take 1 tablet (10 mg total) by mouth daily. TAKE 1 TABLET(5 MG) BY MOUTH DAILY, Disp: 90 tablet, Rfl: 1   Semaglutide (RYBELSUS) 7 MG TABS, Take 1 tablet by mouth every morning. (Patient  not taking: Reported on 01/05/2020), Disp: 30 tablet, Rfl: 5   valsartan-hydrochlorothiazide (DIOVAN-HCT) 320-25 MG tablet, TAKE 1 TABLET BY MOUTH DAILY, Disp: 90 tablet, Rfl: 0   Medications ordered in this encounter:  Meds ordered this encounter  Medications   sulfamethoxazole-trimethoprim (BACTRIM DS) 800-160 MG tablet    Sig: Take 1 tablet by mouth 2 (two) times daily.    Dispense:  10 tablet    Refill:  0    Order Specific Question:   Supervising Provider    Answer:   Sabra Heck, BRIAN [[id number]]     *If you need refills on other medications prior to your next appointment, please contact your pharmacy*  Follow-Up: Call back or seek an in-person evaluation if the symptoms worsen or if the condition fails to improve as anticipated.  Other Instructions Urinary Tract Infection, Adult A urinary tract infection (UTI) is an infection of any part of the urinary tract. The urinary tract includes: The kidneys. The ureters. The bladder. The urethra. These organs make, store, and get rid of pee (urine) in the body. What are the causes? This infection is caused by germs (bacteria) in your genital area. These germs grow and cause swelling (inflammation) of your urinary tract. What increases the risk? The following factors may make you more likely to develop this condition: Using a small, thin tube (catheter) to drain pee. Not being able to control when you pee  or poop (incontinence). Being female. If you are female, these things can increase the risk: Using these methods to prevent pregnancy: A medicine that kills sperm (spermicide). A device that blocks sperm (diaphragm). Having low levels of a female hormone (estrogen). Being pregnant. You are more likely to develop this condition if: You have genes that add to your risk. You are sexually active. You take antibiotic medicines. You have trouble peeing because of: A prostate that is bigger than normal, if you are female. A blockage in the  part of your body that drains pee from the bladder. A kidney stone. A nerve condition that affects your bladder. Not getting enough to drink. Not peeing often enough. You have other conditions, such as: Diabetes. A weak disease-fighting system (immune system). Sickle cell disease. Gout. Injury of the spine. What are the signs or symptoms? Symptoms of this condition include: Needing to pee right away. Peeing small amounts often. Pain or burning when peeing. Blood in the pee. Pee that smells bad or not like normal. Trouble peeing. Pee that is cloudy. Fluid coming from the vagina, if you are female. Pain in the belly or lower back. Other symptoms include: Vomiting. Not feeling hungry. Feeling mixed up (confused). This may be the first symptom in older adults. Being tired and grouchy (irritable). A fever. Watery poop (diarrhea). How is this treated? Taking antibiotic medicine. Taking other medicines. Drinking enough water. In some cases, you may need to see a specialist. Follow these instructions at home: Medicines Take over-the-counter and prescription medicines only as told by your doctor. If you were prescribed an antibiotic medicine, take it as told by your doctor. Do not stop taking it even if you start to feel better. General instructions Make sure you: Pee until your bladder is empty. Do not hold pee for a long time. Empty your bladder after sex. Wipe from front to back after peeing or pooping if you are a female. Use each tissue one time when you wipe. Drink enough fluid to keep your pee pale yellow. Keep all follow-up visits. Contact a doctor if: You do not get better after 1-2 days. Your symptoms go away and then come back. Get help right away if: You have very bad back pain. You have very bad pain in your lower belly. You have a fever. You have chills. You feeling like you will vomit or you vomit. Summary A urinary tract infection (UTI) is an infection  of any part of the urinary tract. This condition is caused by germs in your genital area. There are many risk factors for a UTI. Treatment includes antibiotic medicines. Drink enough fluid to keep your pee pale yellow. This information is not intended to replace advice given to you by your health care provider. Make sure you discuss any questions you have with your health care provider. Document Revised: 12/11/2019 Document Reviewed: 12/11/2019 Elsevier Patient Education  2022 [employer].    If you have been instructed to have an in-person evaluation today at a local Urgent Care facility, please use the link below. It will take you to a list of all of our available [facility], including address, phone number and hours of operation. Please do not delay care.  [facility]  If you or a family member do not have a primary care provider, use the link below to schedule a visit and establish care. When you choose a [facility] primary care physician or advanced practice provider, you gain a  long-term partner in health. Find a Primary Care Provider  Learn more about [facility] in-office and virtual care options: [facility]

## 2021-02-24 ENCOUNTER — Other Ambulatory Visit: Payer: Self-pay | Admitting: Physician Assistant

## 2021-02-24 ENCOUNTER — Ambulatory Visit: Payer: Self-pay

## 2021-02-24 DIAGNOSIS — R3989 Other symptoms and signs involving the genitourinary system: Secondary | ICD-10-CM

## 2021-02-24 NOTE — Telephone Encounter (Signed)
The patient was seen previously for urinary discomfort 02/17/21   The patient is still continuing to experience difficult urination, as well as soreness in their back and lower abdomin   The patient would like to continue taking their prescription for sulfamethoxazole-trimethoprim (BACTRIM DS) 800-160 MG tablet [[id number]]     The patient would also like to discuss their continued discomfort   Please contact further when possible   Pt. Reports she was on antibiotic for UTI. Still has painful urination, back and abdominal pain. Temp. Last night 99. Would like more antibiotic sent to [facility] on [address] [facility]. Please advise pt.   Answer Assessment - Initial Assessment Questions 1. ANTIBIOTIC: "What antibiotic are you taking?" "How many times per day?"     Bactrim 2. DURATION: "When was the antibiotic started?"     Last week 3. MAIN SYMPTOM: "What is the main symptom you are concerned about?"     Painful urination, back and abdominal pain 4. FEVER: "Do you have a fever?" If Yes, ask: "What is it, how was it measured, and when did it start?"     Last night 99 5. OTHER SYMPTOMS: "Do you have any other symptoms?" (e.g., flank pain, vaginal discharge, blood in urine)     Chills  Protocols used: Urinary Tract Infection on Antibiotic Follow-up Call - [facility]

## 2021-02-27 ENCOUNTER — Encounter: Payer: Self-pay | Admitting: Family Medicine

## 2021-02-27 ENCOUNTER — Other Ambulatory Visit: Payer: Self-pay

## 2021-02-27 ENCOUNTER — Ambulatory Visit (INDEPENDENT_AMBULATORY_CARE_PROVIDER_SITE_OTHER): Payer: Managed Care, Other (non HMO) | Admitting: Family Medicine

## 2021-02-27 DIAGNOSIS — E559 Vitamin D deficiency, unspecified: Secondary | ICD-10-CM | POA: Diagnosis not present

## 2021-02-27 DIAGNOSIS — M4696 Unspecified inflammatory spondylopathy, lumbar region: Secondary | ICD-10-CM

## 2021-02-27 DIAGNOSIS — F339 Major depressive disorder, recurrent, unspecified: Secondary | ICD-10-CM

## 2021-02-27 DIAGNOSIS — I10 Essential (primary) hypertension: Secondary | ICD-10-CM

## 2021-02-27 DIAGNOSIS — R7303 Prediabetes: Secondary | ICD-10-CM | POA: Diagnosis not present

## 2021-02-27 DIAGNOSIS — I700 Atherosclerosis of aorta: Secondary | ICD-10-CM

## 2021-02-27 DIAGNOSIS — R300 Dysuria: Secondary | ICD-10-CM

## 2021-02-27 DIAGNOSIS — I779 Disorder of arteries and arterioles, unspecified: Secondary | ICD-10-CM

## 2021-02-27 DIAGNOSIS — E7800 Pure hypercholesterolemia, unspecified: Secondary | ICD-10-CM

## 2021-02-27 LAB — POCT URINALYSIS DIPSTICK
Bilirubin, UA: NEGATIVE
Blood, UA: NEGATIVE
Glucose, UA: NEGATIVE
Ketones, UA: NEGATIVE
Leukocytes, UA: NEGATIVE
Nitrite, UA: NEGATIVE
Protein, UA: NEGATIVE
Spec Grav, UA: 1.015 (ref 1.010–1.025)
Urobilinogen, UA: 0.2 E.U./dL
pH, UA: 6 (ref 5.0–8.0)

## 2021-02-27 LAB — POCT GLYCOSYLATED HEMOGLOBIN (HGB A1C): Hemoglobin A1C: 5.9 % — AB (ref 4.0–5.6)

## 2021-02-27 MED ORDER — ROSUVASTATIN CALCIUM 10 MG PO TABS: 10.0000 mg | ORAL_TABLET | Freq: Every day | ORAL | 1 refills | Status: DC

## 2021-02-27 MED ORDER — AMLODIPINE BESYLATE 2.5 MG PO TABS: 2.5000 mg | ORAL_TABLET | Freq: Every day | ORAL | 1 refills | Status: DC

## 2021-02-27 NOTE — Progress Notes (Signed)
Name: Terri Werner   MRN: [id number]    DOB: [birth year]   Date:02/27/2021       Progress Note  Subjective  Chief Complaint  UTI and follow up  HPI  UTI: she noticed dysuria, urinary frequency,  nocturia and supra pubic pain going on for two weeks, she had a virtual visit last week and was given antibiotics and felt some improvement but still has symptoms. Denies vaginal discharge, she has not been sexually active since 2001.   HTN: she states norvasc causes body aches, last dose was yesterday morning, bp is at goal, we will stop medication, change valsartan hctz from 160/25 to 320/25 tin March 2021 but lost to follow up. Today bp is at goal, but not sure how she still has medications at home    Chronic low back pain: she used to see Dr. Cherylann Ratel, also seen by neurosurgeon Marcell Barlow  in the past and Dr. Council Mechanic., diagnosed with inflamatory spondylosis of lumbar spine.   She states pain is daily around  6/10  She states sitting much more now with tele medicine  She failed neurontin and lyrica, using topical medications and otc . She is now on Tylenol daily .   Morbid obesity/prediabetes: she started to gain weight around 2011 when taking care of husband ( died of cancer) after that also took care of her sister, she has a stressful job and skips meals. She is also working from home and sitting more often , not physically active, she has pre-diabetes previous A1C done at [place] [facility] [facility] was up to 6.6% last one was 6.1% today 5.9 % , she was given Rybelsus since 01/2019 and had lost 9 lbs, she has been out of medication but weight has been stable.   Chronic constipation: she states always had problems, taking otc medication because of cost of [employer]. She has stricture of sigmoid during last colonoscopy 2021, had a CT done and advised to follow up, she is seeing another provider January 2023 . She still takes laxatives to be able to have a bowel movement    Hyperlipidemia/Atherosclerosis  of Aorta:  tried pravastatin but caused muscle pain, she has carotid atherosclerosis and tortuous ICA, LDL went up from 73 to above 90. We gave her Rosuvastatin on her last visit, but she lost to follow up, last visit was 07/2019    Major Depression : she is still overwhelmed at work, a lot of volume,  Currently only working from home  Unchanged    Patient Active Problem List   Diagnosis Date Noted   Morbid obesity (HCC) 09/24/2018   Unspecified inflammatory spondylopathy, lumbar region (HCC) 03/05/2018   Lumbar spondylolysis 08/20/2017   Lumbar facet arthropathy 08/20/2017   Lumbar degenerative disc disease 08/20/2017   Chronic constipation 07/30/2017   Primary osteoarthritis involving multiple joints 04/17/2017   Postmenopausal 10/12/2016   Prediabetes 10/12/2016   Lumbago 08/04/2015   Diverticulitis of colon 07/05/2014   Carotid artery disorder (HCC) 04/27/2014   Hyperlipidemia 10/08/2013   Hypertension, benign 11/19/2009    Past Surgical History:  Procedure Laterality Date   ABDOMINAL HYSTERECTOMY  2006   APPENDECTOMY  1980   CESAREAN SECTION  1981   COLONOSCOPY WITH PROPOFOL N/A 11/01/2014   Procedure: COLONOSCOPY WITH PROPOFOL;  Surgeon: Scot Jun, MD;  Location: [facility];  Service: Endoscopy;  Laterality: N/A;   COLONOSCOPY WITH PROPOFOL N/A 01/05/2020   Procedure: COLONOSCOPY WITH PROPOFOL;  Surgeon: Regis Bill, MD;  Location: [facility];  Service: Endoscopy;  Laterality: N/A;   ECTOPIC PREGNANCY SURGERY  1980   HERNIA REPAIR  2006   Umbilical   KNEE ARTHROSCOPY Left 2006   torn meniscus   TONSILLECTOMY  1971   TOTAL ABDOMINAL HYSTERECTOMY W/ BILATERAL SALPINGOOPHORECTOMY  2006   VESICOVAGINAL FISTULA CLOSURE W/ TAH      Family History  Problem Relation Age of Onset   Breast cancer Sister 35   Hypertension Mother    Alzheimer's disease Mother    Dementia Mother    Hypertension Maternal Grandfather    Hyperlipidemia Maternal Grandfather     Birth defects Maternal Grandmother        Bladder   Colon cancer Maternal Grandmother    Congestive Heart Failure Father    Hyperlipidemia Father    Dementia Paternal Grandmother    Dementia Paternal Grandfather    Thyroid disease Sister     Social History   Tobacco Use   Smoking status: Never   Smokeless tobacco: Never  Substance Use Topics   Alcohol use: Yes    Comment: rare     Current Outpatient Medications:    acetaminophen (TYLENOL) 500 MG tablet, Take 500 mg by mouth every 6 (six) hours as needed., Disp: , Rfl:    amLODipine (NORVASC) 2.5 MG tablet, Take 2.5 mg by mouth daily., Disp: , Rfl:    aspirin 81 MG tablet, Take 81 mg by mouth daily., Disp: , Rfl:    Cholecalciferol (VITAMIN D) 2000 UNITS CAPS, Take 2,000 Units by mouth daily., Disp: , Rfl:    rosuvastatin (CRESTOR) 10 MG tablet, Take 1 tablet (10 mg total) by mouth daily. TAKE 1 TABLET(5 MG) BY MOUTH DAILY, Disp: 90 tablet, Rfl: 1   Coenzyme Q10 10 MG capsule, Take 10 mg by mouth daily. (Patient not taking: Reported on 02/27/2021), Disp: , Rfl:    diclofenac sodium (VOLTAREN) 1 % GEL, Apply 2 g topically 4 (four) times daily. Uses a couple time a week (Patient not taking: Reported on 02/27/2021), Disp: 100 g, Rfl: 2   fluticasone (FLONASE) 50 MCG/ACT nasal spray, Place 2 sprays into both nostrils daily. (Patient not taking: Reported on 02/27/2021), Disp: 48 g, Rfl: 1   lidocaine (LIDODERM) 5 %, Place 3 patches onto the skin every 12 (twelve) hours as needed. Remove & Discard patch within 12 hours (Patient not taking: No sig reported), Disp: 90 patch, Rfl: 2   sulfamethoxazole-trimethoprim (BACTRIM DS) 800-160 MG tablet, Take 1 tablet by mouth 2 (two) times daily. (Patient not taking: Reported on 02/27/2021), Disp: 10 tablet, Rfl: 0  Allergies  Allergen Reactions   Shrimp [Shellfish Allergy] Swelling    Lips will burn    Latex Itching    Burning    I personally reviewed active problem list, medication list,  allergies, family history, social history, health maintenance with the patient/caregiver today.   ROS  Constitutional: Negative for fever or weight change.  Respiratory: Negative for cough and shortness of breath.   Cardiovascular: Negative for chest pain or palpitations.  Gastrointestinal: Negative for abdominal pain, no bowel changes.  Musculoskeletal: Negative for gait problem or joint swelling.  Skin: Negative for rash.  Neurological: Negative for dizziness or headache.  No other specific complaints in a complete review of systems (except as listed in HPI above).   Objective  Vitals:   02/27/21 0950  BP: 126/82  Pulse: 87  Resp: 16  Temp: 98.1 F (36.7 C)  SpO2: 98%  Weight: 230 lb (104.3 kg)  Height: 5\' 2"  (1.575  m)    Body mass index is 42.07 kg/m.  Physical Exam  Constitutional: Patient appears well-developed and well-nourished. Obese  No distress.  HEENT: head atraumatic, normocephalic, pupils equal and reactive to light, neck supple Cardiovascular: Normal rate, regular rhythm and normal heart sounds.  No murmur heard. No BLE edema. Pulmonary/Chest: Effort normal and breath sounds normal. No respiratory distress. Abdominal: Soft.  There is no tenderness. External genitalia no lesions, urethra normal, but introitus sensitive to touch, pain during palpation of supra pubic area  Psychiatric: Patient has a normal mood and affect. behavior is normal. Judgment and thought content normal.   Recent Results (from the past 2160 hour(s))  POCT Urinalysis Dipstick     Status: None   Collection Time: 02/27/21 10:01 AM  Result Value Ref Range   Color, UA Yellow    Clarity, UA Clear    Glucose, UA Negative Negative   Bilirubin, UA Negative    Ketones, UA Negative    Spec Grav, UA 1.015 1.010 - 1.025   Blood, UA Negative    pH, UA 6.0 5.0 - 8.0   Protein, UA Negative Negative   Urobilinogen, UA 0.2 0.2 or 1.0 E.U./dL   Nitrite, UA Negative    Leukocytes, UA Negative  Negative   Appearance     Odor    POCT glycosylated hemoglobin (Hb A1C)     Status: Abnormal   Collection Time: 02/27/21 10:30 AM  Result Value Ref Range   Hemoglobin A1C 5.9 (A) 4.0 - 5.6 %   HbA1c POC (<> result, manual entry)     HbA1c, POC (prediabetic range)     HbA1c, POC (controlled diabetic range)        PHQ2/9: Depression screen [facility] 2/9 02/27/2021 07/23/2019 05/05/2019 04/17/2019 01/13/2019  Decreased Interest 0 0 1 2 0  Down, Depressed, Hopeless 1 0 0 3 1  PHQ - 2 Score 1 0 1 5 1   Altered sleeping 0 0 0 3 0  Tired, decreased energy 3 1 3 3 3   Change in appetite 0 0 0 1 0  Feeling bad or failure about yourself  0 0 0 0 0  Trouble concentrating 0 0 0 2 0  Moving slowly or fidgety/restless 0 0 0 0 0  Suicidal thoughts 0 0 0 1 0  PHQ-9 Score 4 1 4 15 4   Difficult doing work/chores - Not difficult at all Not difficult at all Somewhat difficult Not difficult at all  Some recent data might be hidden    phq 9 is negative   Fall Risk: Fall Risk  02/27/2021 07/23/2019 05/05/2019 01/13/2019 09/24/2018  Falls in the past year? 1 - 1 1 0  Comment - - Feb. 2020 - -  Number falls in past yr: 0 0 0 1 0  Injury with Fall? 0 0 0 1 0  Risk for fall due to : No Fall Risks - - - -  Risk for fall due to: Comment - - - - -  Follow up Falls prevention discussed - - - -  Comment - - - - -      Functional Status Survey: Is the patient deaf or have difficulty hearing?: No Does the patient have difficulty seeing, even when wearing glasses/contacts?: No Does the patient have difficulty concentrating, remembering, or making decisions?: No Does the patient have difficulty walking or climbing stairs?: No Does the patient have difficulty dressing or bathing?: No Does the patient have difficulty doing errands alone such as visiting a  doctor's office or shopping?: No    Assessment & Plan  1. Dysuria  - POCT Urinalysis Dipstick - CULTURE, URINE COMPREHENSIVE  Explained we will get  culture and treat accordingly if culture negative needs to follow up with GI to treat constipation   2. Vitamin D deficiency   3. Pre-diabetes  - POCT glycosylated hemoglobin (Hb A1C)  4. Pure hypercholesterolemia  - Lipid panel  5. Hypertension, benign  - CBC with Differential/Platelet - COMPLETE METABOLIC PANEL WITH GFR  6. Morbid obesity (HCC)  Discussed with the patient the risk posed by an increased BMI. Discussed importance of portion control, calorie counting and at least 150 minutes of physical activity weekly. Avoid sweet beverages and drink more water. Eat at least 6 servings of fruit and vegetables daily    7. Carotid artery disorder (HCC)   8. Major depression, recurrent, chronic (HCC)   9. Unspecified inflammatory spondylopathy, lumbar region (HCC)   10. Atherosclerosis of aorta (HCC)

## 2021-02-28 ENCOUNTER — Encounter (INDEPENDENT_AMBULATORY_CARE_PROVIDER_SITE_OTHER): Payer: Self-pay

## 2021-02-28 ENCOUNTER — Other Ambulatory Visit: Payer: Self-pay | Admitting: Family Medicine

## 2021-02-28 DIAGNOSIS — R5383 Other fatigue: Secondary | ICD-10-CM

## 2021-02-28 NOTE — Progress Notes (Signed)
Lab added

## 2021-03-01 ENCOUNTER — Encounter: Payer: Self-pay | Admitting: Family Medicine

## 2021-03-01 LAB — CULTURE, URINE COMPREHENSIVE
MICRO NUMBER:: 12511921
RESULT:: NO GROWTH
SPECIMEN QUALITY:: ADEQUATE

## 2021-03-01 LAB — COMPLETE METABOLIC PANEL WITH GFR
AG Ratio: 1.3 (calc) (ref 1.0–2.5)
ALT: 9 U/L (ref 6–29)
AST: 14 U/L (ref 10–35)
Albumin: 4.1 g/dL (ref 3.6–5.1)
Alkaline phosphatase (APISO): 49 U/L (ref 37–153)
BUN: 14 mg/dL (ref 7–25)
CO2: 26 mmol/L (ref 20–32)
Calcium: 9.5 mg/dL (ref 8.6–10.4)
Chloride: 104 mmol/L (ref 98–110)
Creat: 0.9 mg/dL (ref 0.50–1.05)
Globulin: 3.2 g/dL (calc) (ref 1.9–3.7)
Glucose, Bld: 103 mg/dL — ABNORMAL HIGH (ref 65–99)
Potassium: 4.2 mmol/L (ref 3.5–5.3)
Sodium: 139 mmol/L (ref 135–146)
Total Bilirubin: 0.3 mg/dL (ref 0.2–1.2)
Total Protein: 7.3 g/dL (ref 6.1–8.1)
eGFR: 69 mL/min/{1.73_m2} (ref >=60)

## 2021-03-01 LAB — CBC WITH DIFFERENTIAL/PLATELET
Absolute Monocytes: 477 cells/uL (ref 200–950)
Basophils Absolute: 30 cells/uL (ref 0–200)
Basophils Relative: 0.7 %
Eosinophils Absolute: 172 cells/uL (ref 15–500)
Eosinophils Relative: 4 %
HCT: 37.7 % (ref 35.0–45.0)
Hemoglobin: 12 g/dL (ref 11.7–15.5)
Lymphs Abs: 1488 cells/uL (ref 850–3900)
MCH: 27.6 pg (ref 27.0–33.0)
MCHC: 31.8 g/dL — ABNORMAL LOW (ref 32.0–36.0)
MCV: 86.7 fL (ref 80.0–100.0)
MPV: 10.4 fL (ref 7.5–12.5)
Monocytes Relative: 11.1 %
Neutro Abs: 2133 cells/uL (ref 1500–7800)
Neutrophils Relative %: 49.6 %
Platelets: 247 10*3/uL (ref 140–400)
RBC: 4.35 10*6/uL (ref 3.80–5.10)
RDW: 14.1 % (ref 11.0–15.0)
Total Lymphocyte: 34.6 %
WBC: 4.3 10*3/uL (ref 3.8–10.8)

## 2021-03-01 LAB — LIPID PANEL
Cholesterol: 171 mg/dL (ref <200)
HDL: 56 mg/dL (ref >=50)
LDL Cholesterol (Calc): 101 mg/dL (calc) — ABNORMAL HIGH
Non-HDL Cholesterol (Calc): 115 mg/dL (calc) (ref <130)
Total CHOL/HDL Ratio: 3.1 (calc) (ref <5.0)
Triglycerides: 62 mg/dL (ref <150)

## 2021-03-01 LAB — TEST AUTHORIZATION

## 2021-03-01 LAB — TSH: TSH: 1.8 m[IU]/L (ref 0.40–4.50)

## 2021-03-12 ENCOUNTER — Encounter: Payer: Self-pay | Admitting: Family Medicine

## 2021-05-24 DIAGNOSIS — Z1211 Encounter for screening for malignant neoplasm of colon: Secondary | ICD-10-CM | POA: Diagnosis not present

## 2021-05-24 DIAGNOSIS — K5730 Diverticulosis of large intestine without perforation or abscess without bleeding: Secondary | ICD-10-CM | POA: Diagnosis not present

## 2021-05-27 ENCOUNTER — Other Ambulatory Visit: Payer: Self-pay

## 2021-05-27 ENCOUNTER — Inpatient Hospital Stay (HOSPITAL_COMMUNITY): Payer: Medicare Other

## 2021-05-27 ENCOUNTER — Emergency Department
Admission: EM | Admit: 2021-05-27 | Discharge: 2021-05-27 | Disposition: A | Discharge status: Nursing Home (Includes ICF) | Payer: Medicare Other | Attending: Emergency Medicine | Admitting: Emergency Medicine

## 2021-05-27 ENCOUNTER — Emergency Department: Payer: Medicare Other

## 2021-05-27 ENCOUNTER — Encounter (HOSPITAL_COMMUNITY): Payer: Self-pay | Admitting: Neurology

## 2021-05-27 ENCOUNTER — Inpatient Hospital Stay (HOSPITAL_COMMUNITY)
Admission: EM | Admit: 2021-05-27 | Discharge: 2021-05-31 | DRG: 064 | Disposition: A | Discharge status: Rehabilitation Facility | Payer: Medicare Other | Attending: Internal Medicine | Admitting: Internal Medicine

## 2021-05-27 DIAGNOSIS — R531 Weakness: Secondary | ICD-10-CM | POA: Diagnosis not present

## 2021-05-27 DIAGNOSIS — Z8619 Personal history of other infectious and parasitic diseases: Secondary | ICD-10-CM | POA: Diagnosis not present

## 2021-05-27 DIAGNOSIS — B961 Klebsiella pneumoniae [K. pneumoniae] as the cause of diseases classified elsewhere: Secondary | ICD-10-CM | POA: Diagnosis present

## 2021-05-27 DIAGNOSIS — Z803 Family history of malignant neoplasm of breast: Secondary | ICD-10-CM | POA: Diagnosis not present

## 2021-05-27 DIAGNOSIS — G936 Cerebral edema: Secondary | ICD-10-CM | POA: Diagnosis present

## 2021-05-27 DIAGNOSIS — G8191 Hemiplegia, unspecified affecting right dominant side: Secondary | ICD-10-CM | POA: Diagnosis present

## 2021-05-27 DIAGNOSIS — Z0389 Encounter for observation for other suspected diseases and conditions ruled out: Secondary | ICD-10-CM | POA: Diagnosis not present

## 2021-05-27 DIAGNOSIS — Z79899 Other long term (current) drug therapy: Secondary | ICD-10-CM | POA: Insufficient documentation

## 2021-05-27 DIAGNOSIS — Z7982 Long term (current) use of aspirin: Secondary | ICD-10-CM

## 2021-05-27 DIAGNOSIS — Z9114 Patient's other noncompliance with medication regimen: Secondary | ICD-10-CM

## 2021-05-27 DIAGNOSIS — E669 Obesity, unspecified: Secondary | ICD-10-CM | POA: Diagnosis not present

## 2021-05-27 DIAGNOSIS — I6522 Occlusion and stenosis of left carotid artery: Secondary | ICD-10-CM | POA: Diagnosis not present

## 2021-05-27 DIAGNOSIS — S06369A Traumatic hemorrhage of cerebrum, unspecified, with loss of consciousness of unspecified duration, initial encounter: Secondary | ICD-10-CM | POA: Insufficient documentation

## 2021-05-27 DIAGNOSIS — I69151 Hemiplegia and hemiparesis following nontraumatic intracerebral hemorrhage affecting right dominant side: Secondary | ICD-10-CM | POA: Diagnosis not present

## 2021-05-27 DIAGNOSIS — Z83438 Family history of other disorder of lipoprotein metabolism and other lipidemia: Secondary | ICD-10-CM

## 2021-05-27 DIAGNOSIS — M48061 Spinal stenosis, lumbar region without neurogenic claudication: Secondary | ICD-10-CM | POA: Diagnosis not present

## 2021-05-27 DIAGNOSIS — M7989 Other specified soft tissue disorders: Secondary | ICD-10-CM | POA: Diagnosis not present

## 2021-05-27 DIAGNOSIS — K5900 Constipation, unspecified: Secondary | ICD-10-CM | POA: Diagnosis not present

## 2021-05-27 DIAGNOSIS — T461X6A Underdosing of calcium-channel blockers, initial encounter: Secondary | ICD-10-CM | POA: Diagnosis present

## 2021-05-27 DIAGNOSIS — Z820 Family history of epilepsy and other diseases of the nervous system: Secondary | ICD-10-CM

## 2021-05-27 DIAGNOSIS — Z20822 Contact with and (suspected) exposure to covid-19: Secondary | ICD-10-CM | POA: Insufficient documentation

## 2021-05-27 DIAGNOSIS — M792 Neuralgia and neuritis, unspecified: Secondary | ICD-10-CM | POA: Diagnosis not present

## 2021-05-27 DIAGNOSIS — Z91128 Patient's intentional underdosing of medication regimen for other reason: Secondary | ICD-10-CM

## 2021-05-27 DIAGNOSIS — R0789 Other chest pain: Secondary | ICD-10-CM | POA: Diagnosis not present

## 2021-05-27 DIAGNOSIS — S06329A Contusion and laceration of left cerebrum with loss of consciousness of unspecified duration, initial encounter: Secondary | ICD-10-CM

## 2021-05-27 DIAGNOSIS — Z8349 Family history of other endocrine, nutritional and metabolic diseases: Secondary | ICD-10-CM

## 2021-05-27 DIAGNOSIS — M1990 Unspecified osteoarthritis, unspecified site: Secondary | ICD-10-CM | POA: Diagnosis present

## 2021-05-27 DIAGNOSIS — R29704 NIHSS score 4: Secondary | ICD-10-CM | POA: Diagnosis not present

## 2021-05-27 DIAGNOSIS — I6389 Other cerebral infarction: Secondary | ICD-10-CM | POA: Diagnosis not present

## 2021-05-27 DIAGNOSIS — Z9079 Acquired absence of other genital organ(s): Secondary | ICD-10-CM | POA: Diagnosis not present

## 2021-05-27 DIAGNOSIS — I161 Hypertensive emergency: Secondary | ICD-10-CM | POA: Diagnosis present

## 2021-05-27 DIAGNOSIS — I82442 Acute embolism and thrombosis of left tibial vein: Secondary | ICD-10-CM | POA: Diagnosis not present

## 2021-05-27 DIAGNOSIS — Z800 Family history of malignant neoplasm of digestive organs: Secondary | ICD-10-CM

## 2021-05-27 DIAGNOSIS — I10 Essential (primary) hypertension: Secondary | ICD-10-CM | POA: Diagnosis present

## 2021-05-27 DIAGNOSIS — B9620 Unspecified Escherichia coli [E. coli] as the cause of diseases classified elsewhere: Secondary | ICD-10-CM | POA: Diagnosis present

## 2021-05-27 DIAGNOSIS — E785 Hyperlipidemia, unspecified: Secondary | ICD-10-CM | POA: Diagnosis present

## 2021-05-27 DIAGNOSIS — I611 Nontraumatic intracerebral hemorrhage in hemisphere, cortical: Principal | ICD-10-CM | POA: Diagnosis present

## 2021-05-27 DIAGNOSIS — R58 Hemorrhage, not elsewhere classified: Secondary | ICD-10-CM | POA: Diagnosis not present

## 2021-05-27 DIAGNOSIS — Z1619 Resistance to other specified beta lactam antibiotics: Secondary | ICD-10-CM | POA: Diagnosis not present

## 2021-05-27 DIAGNOSIS — R29898 Other symptoms and signs involving the musculoskeletal system: Secondary | ICD-10-CM

## 2021-05-27 DIAGNOSIS — R253 Fasciculation: Secondary | ICD-10-CM | POA: Diagnosis not present

## 2021-05-27 DIAGNOSIS — N390 Urinary tract infection, site not specified: Secondary | ICD-10-CM | POA: Diagnosis not present

## 2021-05-27 DIAGNOSIS — R202 Paresthesia of skin: Secondary | ICD-10-CM | POA: Diagnosis not present

## 2021-05-27 DIAGNOSIS — Z6841 Body Mass Index (BMI) 40.0 and over, adult: Secondary | ICD-10-CM | POA: Diagnosis not present

## 2021-05-27 DIAGNOSIS — R29705 NIHSS score 5: Secondary | ICD-10-CM | POA: Diagnosis not present

## 2021-05-27 DIAGNOSIS — I619 Nontraumatic intracerebral hemorrhage, unspecified: Secondary | ICD-10-CM | POA: Diagnosis present

## 2021-05-27 DIAGNOSIS — Z8249 Family history of ischemic heart disease and other diseases of the circulatory system: Secondary | ICD-10-CM | POA: Diagnosis not present

## 2021-05-27 DIAGNOSIS — X58XXXA Exposure to other specified factors, initial encounter: Secondary | ICD-10-CM | POA: Diagnosis not present

## 2021-05-27 DIAGNOSIS — S0990XA Unspecified injury of head, initial encounter: Secondary | ICD-10-CM | POA: Diagnosis present

## 2021-05-27 DIAGNOSIS — R200 Anesthesia of skin: Secondary | ICD-10-CM

## 2021-05-27 DIAGNOSIS — K5901 Slow transit constipation: Secondary | ICD-10-CM | POA: Diagnosis not present

## 2021-05-27 DIAGNOSIS — Z86010 Personal history of colonic polyps: Secondary | ICD-10-CM | POA: Diagnosis not present

## 2021-05-27 DIAGNOSIS — I639 Cerebral infarction, unspecified: Secondary | ICD-10-CM

## 2021-05-27 DIAGNOSIS — Z90710 Acquired absence of both cervix and uterus: Secondary | ICD-10-CM | POA: Diagnosis not present

## 2021-05-27 DIAGNOSIS — M21371 Foot drop, right foot: Secondary | ICD-10-CM | POA: Diagnosis not present

## 2021-05-27 DIAGNOSIS — M79604 Pain in right leg: Secondary | ICD-10-CM | POA: Diagnosis not present

## 2021-05-27 DIAGNOSIS — K219 Gastro-esophageal reflux disease without esophagitis: Secondary | ICD-10-CM | POA: Diagnosis present

## 2021-05-27 HISTORY — DX: Cerebral infarction, unspecified: I63.9

## 2021-05-27 LAB — COMPREHENSIVE METABOLIC PANEL
ALT: 11 U/L (ref 0–44)
AST: 24 U/L (ref 15–41)
Albumin: 3.9 g/dL (ref 3.5–5.0)
Alkaline Phosphatase: 46 U/L (ref 38–126)
Anion gap: 5 (ref 5–15)
BUN: 13 mg/dL (ref 8–23)
CO2: 25 mmol/L (ref 22–32)
Calcium: 9.1 mg/dL (ref 8.9–10.3)
Chloride: 106 mmol/L (ref 98–111)
Creatinine, Ser: 0.74 mg/dL (ref 0.44–1.00)
GFR, Estimated: >60 mL/min (ref >60)
Glucose, Bld: 93 mg/dL (ref 70–99)
Potassium: 4.7 mmol/L (ref 3.5–5.1)
Sodium: 136 mmol/L (ref 135–145)
Total Bilirubin: 0.9 mg/dL (ref 0.3–1.2)
Total Protein: 7.4 g/dL (ref 6.5–8.1)

## 2021-05-27 LAB — MRSA NEXT GEN BY PCR, NASAL: MRSA by PCR Next Gen: NOT DETECTED

## 2021-05-27 LAB — CBC
HCT: 39.8 % (ref 36.0–46.0)
Hemoglobin: 12.5 g/dL (ref 12.0–15.0)
MCH: 28 pg (ref 26.0–34.0)
MCHC: 31.4 g/dL (ref 30.0–36.0)
MCV: 89.2 fL (ref 80.0–100.0)
Platelets: 185 10*3/uL (ref 150–400)
RBC: 4.46 MIL/uL (ref 3.87–5.11)
RDW: 15.1 % (ref 11.5–15.5)
WBC: 5.9 10*3/uL (ref 4.0–10.5)
nRBC: 0 % (ref 0.0–0.2)

## 2021-05-27 LAB — DIFFERENTIAL
Abs Immature Granulocytes: 0.02 10*3/uL (ref 0.00–0.07)
Basophils Absolute: 0 10*3/uL (ref 0.0–0.1)
Basophils Relative: 0 %
Eosinophils Absolute: 0.1 10*3/uL (ref 0.0–0.5)
Eosinophils Relative: 2 %
Immature Granulocytes: 0 %
Lymphocytes Relative: 32 %
Lymphs Abs: 1.9 10*3/uL (ref 0.7–4.0)
Monocytes Absolute: 0.6 10*3/uL (ref 0.1–1.0)
Monocytes Relative: 10 %
Neutro Abs: 3.3 10*3/uL (ref 1.7–7.7)
Neutrophils Relative %: 56 %

## 2021-05-27 LAB — APTT: aPTT: 27 seconds (ref 24–36)

## 2021-05-27 LAB — PROTIME-INR
INR: 1.1 (ref 0.8–1.2)
Prothrombin Time: 13.8 seconds (ref 11.4–15.2)

## 2021-05-27 LAB — RESP PANEL BY RT-PCR (FLU A&B, COVID) ARPGX2
Influenza A by PCR: NEGATIVE
Influenza B by PCR: NEGATIVE
SARS Coronavirus 2 by RT PCR: NEGATIVE

## 2021-05-27 LAB — CBG MONITORING, ED: Glucose-Capillary: 74 mg/dL (ref 70–99)

## 2021-05-27 MED ORDER — CHLORHEXIDINE GLUCONATE CLOTH 2 % EX PADS
6.0000 | MEDICATED_PAD | Freq: Every day | CUTANEOUS | Status: DC
  Administered 2021-05-28 – 2021-05-31 (×4): 6 via TOPICAL

## 2021-05-27 MED ORDER — ACETAMINOPHEN 160 MG/5ML PO SOLN: 650.0000 mg | ORAL | Status: DC | PRN

## 2021-05-27 MED ORDER — IOHEXOL 350 MG/ML SOLN
75.0000 mL | Freq: Once | INTRAVENOUS | Status: AC | PRN
  Administered 2021-05-27: 75 mL via INTRAVENOUS

## 2021-05-27 MED ORDER — SODIUM CHLORIDE 0.9 % IV SOLN
2000.0000 mg | INTRAVENOUS | Status: AC
  Administered 2021-05-27: 2000 mg via INTRAVENOUS
  Filled 2021-05-27: qty 20

## 2021-05-27 MED ORDER — CLEVIDIPINE BUTYRATE 0.5 MG/ML IV EMUL
0.0000 mg/h | INTRAVENOUS | Status: DC
  Administered 2021-05-27: 2 mg/h via INTRAVENOUS
  Filled 2021-05-27: qty 50

## 2021-05-27 MED ORDER — ACETAMINOPHEN 325 MG PO TABS
650.0000 mg | ORAL_TABLET | ORAL | Status: DC | PRN
  Administered 2021-05-27 – 2021-05-31 (×6): 650 mg via ORAL
  Filled 2021-05-27 (×6): qty 2

## 2021-05-27 MED ORDER — LORAZEPAM 2 MG/ML IJ SOLN
1.0000 mg | Freq: Once | INTRAMUSCULAR | Status: AC
  Administered 2021-05-27: 1 mg via INTRAVENOUS
  Filled 2021-05-27: qty 1

## 2021-05-27 MED ORDER — STROKE: EARLY STAGES OF RECOVERY BOOK
Freq: Once | Status: AC
  Filled 2021-05-27: qty 1

## 2021-05-27 MED ORDER — SODIUM CHLORIDE 0.9 % IV SOLN: INTRAVENOUS | Status: DC

## 2021-05-27 MED ORDER — LABETALOL HCL 5 MG/ML IV SOLN
10.0000 mg | INTRAVENOUS | Status: DC | PRN
  Administered 2021-05-29: 10 mg via INTRAVENOUS
  Filled 2021-05-27: qty 4

## 2021-05-27 MED ORDER — HYDRALAZINE HCL 20 MG/ML IJ SOLN
10.0000 mg | INTRAMUSCULAR | Status: DC | PRN
  Administered 2021-05-27 – 2021-05-29 (×3): 10 mg via INTRAVENOUS
  Filled 2021-05-27 (×4): qty 1

## 2021-05-27 MED ORDER — NICARDIPINE HCL IN NACL 20-0.86 MG/200ML-% IV SOLN
3.0000 mg/h | INTRAVENOUS | Status: DC
  Administered 2021-05-27: 3 mg/h via INTRAVENOUS
  Filled 2021-05-27: qty 200

## 2021-05-27 MED ORDER — SODIUM CHLORIDE 0.9% FLUSH
3.0000 mL | Freq: Once | INTRAVENOUS | Status: AC
  Administered 2021-05-27: 3 mL via INTRAVENOUS

## 2021-05-27 MED ORDER — PANTOPRAZOLE SODIUM 40 MG IV SOLR
40.0000 mg | Freq: Every day | INTRAVENOUS | Status: DC
  Administered 2021-05-27: 40 mg via INTRAVENOUS
  Filled 2021-05-27: qty 40

## 2021-05-27 MED ORDER — SENNOSIDES-DOCUSATE SODIUM 8.6-50 MG PO TABS
1.0000 | ORAL_TABLET | Freq: Two times a day (BID) | ORAL | Status: DC
  Administered 2021-05-28 – 2021-05-31 (×6): 1 via ORAL
  Filled 2021-05-27 (×7): qty 1

## 2021-05-27 MED ORDER — ACETAMINOPHEN 650 MG RE SUPP: 650.0000 mg | RECTAL | Status: DC | PRN

## 2021-05-27 NOTE — H&P (Addendum)
Neurology H&P [place]  Reason for Consult: ICH tx from [facility] Referring Physician: Dr Jari Pigg  CC: Right leg weakness  History is obtained from: Patient, chart, family  HPI: Terri Werner is a 71 y.o. female past medical history of hypertension ran out of her medications and has not been doing them for a while, with sudden onset of right leg weakness.  She drove herself to church this morning and in the afternoon noted that she cannot move her right leg.  She was brought in as an acute code stroke to [facility] where she was found to have a left high frontal ICH.  Last known well 1300 hrs.  Reported a mild headache that is better with Tylenol.  Does not report any visual changes.  Does not report any nausea or vomiting.  Does not report any chest pain palpitations shortness of breath. She said she was noncompliant to her medications because of some insurance issues and pharmacy switch over this and has not been taking amlodipine since October.   LKW: 1300 hrs. today tpa given?: no, ICH Premorbid modified Rankin scale (mRS): 0 ICH Score: 0   ROS: Full ROS was performed and is negative except as noted in the HPI.   Past Medical History:  Diagnosis Date   Arthritis    pt reports in back & knees   Colon polyps 2010   At [facility]   Diverticulitis 2016   Genital warts    GERD (gastroesophageal reflux disease)    Heart murmur    Hepatitis B    History of blood transfusion    during each major surgery per pt   History of chicken pox    History of hiatal hernia    History of torn meniscus of right knee    HTN (hypertension)    Hx of migraine headaches    Hypertension    Hypopotassemia      Family History  Problem Relation Age of Onset   Breast cancer Sister 105   Hypertension Mother    Alzheimer's disease Mother    Dementia Mother    Hypertension Maternal Grandfather    Hyperlipidemia Maternal Grandfather    Birth defects Maternal Grandmother        Bladder    Colon cancer Maternal Grandmother    Congestive Heart Failure Father    Hyperlipidemia Father    Dementia Paternal Grandmother    Dementia Paternal Grandfather    Thyroid disease Sister     Social History:   reports that she has never smoked. She has never used smokeless tobacco. She reports current alcohol use. She reports that she does not use drugs.  Medications  Current Facility-Administered Medications:    acetaminophen (TYLENOL) tablet 650 mg, 650 mg, Oral, Q4H PRN, 650 mg at 05/27/21 1907 **OR** acetaminophen (TYLENOL) 160 MG/5ML solution 650 mg, 650 mg, Per Tube, Q4H PRN **OR** acetaminophen (TYLENOL) suppository 650 mg, 650 mg, Rectal, Q4H PRN, Kerney Elbe, MD   [START ON 05/28/2021] Chlorhexidine Gluconate Cloth 2 % PADS 6 each, 6 each, Topical, Q0600, Kerney Elbe, MD   clevidipine (CLEVIPREX) infusion 0.5 mg/mL, 0-21 mg/hr, Intravenous, Continuous, Kerney Elbe, MD, Last Rate: 4 mL/hr at 05/27/21 1900, 2 mg/hr at 05/27/21 1900   pantoprazole (PROTONIX) injection 40 mg, 40 mg, Intravenous, QHS, Kerney Elbe, MD   senna-docusate (Senokot-S) tablet 1 tablet, 1 tablet, Oral, BID, Kerney Elbe, MD   Exam: Current vital signs: BP 120/80    Pulse 82    Temp 99  F (37.2 C) (Oral)    Resp 15    Ht 5' 2.01" (1.575 m)    Wt 104.3 kg    SpO2 99%    BMI 42.05 kg/m  Vital signs in last 24 hours: Temp:  [98.8 F (37.1 C)-99 F (37.2 C)] 99 F (37.2 C) (01/14 1753) Pulse Rate:  [81-88] 82 (01/14 1900) Resp:  [15-19] 15 (01/14 1900) BP: (101-168)/(68-97) 120/80 (01/14 1900) SpO2:  [95 %-99 %] 99 % (01/14 1900) Weight:  [104.3 kg] 104.3 kg (01/14 1753)  GENERAL: Awake, alert in NAD HEENT: - Normocephalic and atraumatic, dry mm, no LN++, no Thyromegally LUNGS - Clear to auscultation bilaterally with no wheezes CV - S1S2 RRR, no m/r/g, equal pulses bilaterally. ABDOMEN - Soft, nontender, nondistended with normoactive BS Ext: warm, well perfused, intact peripheral pulses, no  edema  NEURO:  Mental Status: AA&Ox3  Language: speech is nondysarthric.  Naming, repetition, fluency, and comprehension intact. Cranial Nerves: PERRL EOMI, visual fields full, no facial asymmetry, facial sensation intact, hearing intact, tongue/uvula/soft palate midline, normal sternocleidomastoid and trapezius muscle strength. No evidence of tongue atrophy or fibrillations Motor: Right lower extremity with better minimal movement at the hip 1/5 at best.  Right upper extremity with subtle weakness but no vertical drift.  Left side full strength. Tone: is normal and bulk is normal Sensation-diminished in the right lower extremity compared to the left.  Otherwise intact. Coordination: FTN intact bilaterally, cannot perform with the right lower extremity.  No dysmetria in the left leg. Gait- deferred  NIHSS 1a Level of Conscious.: 0 1b LOC Questions: 0 1c LOC Commands: 0 2 Best Gaze: 0 3 Visual: 0 4 Facial Palsy: 0 5a Motor Arm - left: 0 5b Motor Arm - Right: 0 6a Motor Leg - Left: 0 6b Motor Leg - Right: 3 7 Limb Ataxia: 0 8 Sensory: 1 9 Best Language: 0 10 Dysarthria: 0 11 Extinct. and Inatten.: 0 TOTAL: 4    Labs I have reviewed labs in epic and the results pertinent to this consultation are:   CBC    Component Value Date/Time   WBC 5.9 05/27/2021 1458   RBC 4.46 05/27/2021 1458   HGB 12.5 05/27/2021 1458   HGB 13.1 07/03/2012 1718   HCT 39.8 05/27/2021 1458   HCT 40.6 07/03/2012 1718   PLT 185 05/27/2021 1458   PLT 206 07/03/2012 1718   MCV 89.2 05/27/2021 1458   MCV 86 07/03/2012 1718   MCH 28.0 05/27/2021 1458   MCHC 31.4 05/27/2021 1458   RDW 15.1 05/27/2021 1458   RDW 15.3 (H) 07/03/2012 1718   LYMPHSABS 1.9 05/27/2021 1458   MONOABS 0.6 05/27/2021 1458   EOSABS 0.1 05/27/2021 1458   BASOSABS 0.0 05/27/2021 1458    CMP     Component Value Date/Time   NA 136 05/27/2021 1458   NA 141 07/03/2012 1718   K 4.7 05/27/2021 1458   K 3.7 07/03/2012 1718    CL 106 05/27/2021 1458   CL 109 (H) 07/03/2012 1718   CO2 25 05/27/2021 1458   CO2 27 07/03/2012 1718   GLUCOSE 93 05/27/2021 1458   GLUCOSE 77 07/03/2012 1718   BUN 13 05/27/2021 1458   BUN 10 07/03/2012 1718   CREATININE 0.74 05/27/2021 1458   CREATININE 0.90 02/27/2021 1040   CALCIUM 9.1 05/27/2021 1458   CALCIUM 9.0 07/03/2012 1718   PROT 7.4 05/27/2021 1458   PROT 8.3 (H) 07/03/2012 1718   ALBUMIN 3.9 05/27/2021 1458  ALBUMIN 3.8 07/03/2012 1718   AST 24 05/27/2021 1458   AST 22 07/03/2012 1718   ALT 11 05/27/2021 1458   ALT 21 07/03/2012 1718   ALKPHOS 46 05/27/2021 1458   ALKPHOS 67 07/03/2012 1718   BILITOT 0.9 05/27/2021 1458   BILITOT 0.5 07/03/2012 1718   GFRNONAA >60 05/27/2021 1458   GFRNONAA 52 (L) 01/13/2019 0840   GFRAA 60 01/13/2019 0840   Imaging I have reviewed the images obtained:  CT-head-acute IPH in the parasagittal left frontoparietal lobes with mild edema.  No substantial mass-effect.  No IVH. CT angiography head and neck with no abnormal vascularity in the region of the hemorrhage.  No hemodynamically significant stenosis. CT venogram negative for dural venous sinus thrombus.  Assessment:  71 year old woman with hypertension noncompliant to medications with sudden onset of right leg weakness noted to have a left frontal intraparenchymal hematoma.  CT angiography with no underlying vascular malformation.  CT venogram with no evidence of dural venous sinus thrombus.  Likely primary ICH-hypertensive etiology.  Impression: Nontraumatic intracerebral hemorrhage in the left frontal lobe-likely hypertensive in etiology Hypertensive emergency  Plan: Admit to neuro ICU Absolutely no antiplatelets and anticoagulants Systolic blood pressure goal less than 140.  Use labetalol and hydralazine as needed.  Use Cleviprex drip as needed. MRI of the brain with and without contrast tomorrow Repeat head CT at 1030 tonight for hematoma stability May repeat CT  head if there is any neurological worsening Frequent neurochecks Telemetry PT OT speech therapy Lipid panel A1c 2D echo Check labs in the morning-replete electrolytes as necessary Check CBC in the morning-CBC obtained this morning unremarkable for acute abnormalities PT/INR normal. DVT prophylaxis-SCDs only GI prophylaxis-Protonix Bowel: Docusate senna Full code  I had a detailed discussion with the patient and the family at bedside regarding importance of compliance with antihypertensives to avoid any further episodes of ICH due to uncontrolled hypertension.  They verbalized understanding.  They had questions about recovery which I answered to the best of my ability at this time.  Present on admission: Intracerebral hemorrhage, hypertensive urgency  -- Amie Portland, MD Neurologist [facility] Pager: [phone]   CRITICAL CARE ATTESTATION Performed by: Amie Portland, MD Total critical care time: 50 minutes Critical care time was exclusive of separately billable procedures and treating other patients and/or supervising APPs/Residents/Students Critical care was necessary to treat or prevent imminent or life-threatening deterioration due to [facility] urgency This patient is critically ill and at significant risk for neurological worsening and/or death and care requires constant monitoring. Critical care was time spent personally by me on the following activities: development of treatment plan with patient and/or surrogate as well as nursing, discussions with consultants, evaluation of patient's response to treatment, examination of patient, obtaining history from patient or surrogate, ordering and performing treatments and interventions, ordering and review of laboratory studies, ordering and review of radiographic studies, pulse oximetry, re-evaluation of patient's condition, participation in multidisciplinary rounds and medical decision making of high complexity in the  care of this patient.    ADDENDUM 2230 hrs Possible right foot twitching every few min for the last 30 min or so. Given cortical nature of the bleed, this could be seizure. Will order 1 mg Ativan IV x1 Will order a load of Keppra 2 g IV x1 Will defer starting Keppra to the stroke team as standing doses in the morning. Will order routine EEG -- Amie Portland, MD Neurologist [facility] Pager: [phone]  Additional 15 min cc time.

## 2021-05-27 NOTE — ED Notes (Signed)
Report given to brooke at [facility] also report given to [facility] at [facility] all questions answered

## 2021-05-27 NOTE — Consult Note (Addendum)
Neurology H&P  Terri Werner MR# [id number] 05/27/2021  CC: right leg weakness  History is obtained from: Patient, EMS and chart  HPI: Terri Werner is a 71 y.o. female ran out of her amlodipine on Monday and was not able to refill. She was in [place] today and developed acute onset right lower extremity weakness.  LKW: 1300 tpa given?: No, ICH IR Thrombectomy? No, ICH Modified Rankin Scale: 0-Completely asymptomatic and back to baseline post- stroke NIHSS: 5 LOC Responsiveness 0 LOC Questions 0 LOC Commands 0 Horizontal eye movement 0 Visual field 0 Facial palsy 0 Motor arm - Right arm 0 Motor arm - Left arm 0 Motor leg - Right leg 4 Motor leg - Left leg 0 Limb ataxia 0 Sensory test 1 Language 0 Speech 0 Extinction and inattention 0  ROS: A complete ROS was performed and is negative except as noted in the HPI.   Past Medical History:  Diagnosis Date   Arthritis    pt reports in back & knees   Colon polyps 2010   At [facility]   Diverticulitis 2016   Genital warts    GERD (gastroesophageal reflux disease)    Heart murmur    Hepatitis B    History of blood transfusion    during each major surgery per pt   History of chicken pox    History of hiatal hernia    History of torn meniscus of right knee    HTN (hypertension)    Hx of migraine headaches    Hypertension    Hypopotassemia    Family History  Problem Relation Age of Onset   Breast cancer Sister 14   Hypertension Mother    Alzheimer's disease Mother    Dementia Mother    Hypertension Maternal Grandfather    Hyperlipidemia Maternal Grandfather    Birth defects Maternal Grandmother        Bladder   Colon cancer Maternal Grandmother    Congestive Heart Failure Father    Hyperlipidemia Father    Dementia Paternal Grandmother    Dementia Paternal Grandfather    Thyroid disease Sister    Social History:  reports that she has never smoked. She has never used smokeless tobacco. She  reports current alcohol use. She reports that she does not use drugs.  Prior to Admission medications   Medication Sig Start Date End Date Taking? Authorizing Provider  acetaminophen (TYLENOL) 500 MG tablet Take 500 mg by mouth every 6 (six) hours as needed.    [provider]  amLODipine (NORVASC) 2.5 MG tablet Take 1 tablet (2.5 mg total) by mouth daily. 02/27/21   Steele Sizer, MD  aspirin 81 MG tablet Take 81 mg by mouth daily.    [provider]  Cholecalciferol (VITAMIN D) 2000 UNITS CAPS Take 2,000 Units by mouth daily.    [provider]  rosuvastatin (CRESTOR) 10 MG tablet Take 1 tablet (10 mg total) by mouth daily. TAKE 1 TABLET(5 MG) BY MOUTH DAILY 02/27/21   Steele Sizer, MD   Exam: Current vital signs: There were no vitals taken for this visit.  Physical Exam  Appears well-developed and well-nourished.  Psych: Affect appropriate to situation Eyes: No scleral injection HENT: No OP obstrucion Head: Normocephalic.  Cardiovascular: Normal rate and regular rhythm.  Respiratory: Effort normal and breath sounds normal to anterior ascultation GI: Soft.  No distension. There is no tenderness.  Skin: WDI  Neuro: Mental Status: Patient is awake, alert, oriented to person, place,  month, year, and situation. Patient is able to give a clear and coherent history. Speech  fluent, intact comprehension and repetition. No signs of aphasia or neglect. Visual Fields are full. Pupils are equal, round, and reactive to light. EOMI without ptosis or diploplia.  Facial sensation is symmetric to temperature Facial movement is symmetric.  Hearing is intact to voice. Uvula midline, palate elevates symmetrically. Shoulder shrug is symmetric. Tongue is midline without atrophy or fasciculations.  Tone is normal. Bulk is normal. Right lower extremity is flaccid. Sensation is decreased to light touch and temperature in right lower extremity.  Deep Tendon Reflexes:  2+ and symmetric in the biceps and patellae. Babinski (+) R  FNF and HKS are intact bilaterally. Gait - Deferred  I have reviewed labs in epic and the pertinent results are: No pertinent labs at this time  I have reviewed the images obtained: NCT head showed Acute parenchymal hemorrhage in the parasagittal left frontoparietal lobes with mild edema. No substantial mass effect. CTA head and neck did not show contrast jet or perilesional vascular abnormality in the region of hemorrhage.  Assessment: Terri Werner is a 71 y.o. female Hx of HTN ran out of her antihypertensive medications on Monday with hypertensive emergency found to have acute left ACA territory hemorrhage. Hemorrhage is in an unusual location, and angiography was not entirely suggestive of arterial source of hemorrhage and she will need STAT venography to further evaluate.   Impression:  Hypertensive emergency. Acute hemorrhagic stroke in left ACA territory. Right lower extremity numbness and weakness. [place] 5 ICH score 0 HTN Medication nonadherence due to no prescription.   Plan: Elevate head of bed keep head midline. Blood pressure: MAP >65 SBP <140: - Start nicardipine infusion as needed to maintain SBP<140. - Labetalol 20mg  every 10 minutes as needed if SBP>140.  X-ray chest STAT.  She will need to be transferred to [facility] for admission to ICU. Consult neurosurgery. Hold antiplatelets and anticoagulation for now. IV fluids gentle hydration. Repeat CT head in 6 hours (or sooner if clinical worsening). Echocardiogram. Keep platelets >100k, INR<1.4 Replete electrolytes as needed. Labs: Coags, CBC, type and cross, CMP, Mg, Phos, fasting lipids, HbA1c, hCRP, troponins, urinalysis. Maintain O2 sats > 94%. Normothermia - For temperature >37.5C - acetaminophen 650mg  q4-6 hours PRN. Relative euglycemia (~ <180) and treat if hyperglycemia (>200 mg/dL)/hypoglycemia (< 60mg /dL). Avoid dextrose  containing fluids. Euvolemia - Strict I/Os. Precautions: Airway and herniation, seizure, aspiration. PPx: SCDs for now, Senna/docusate, PPI. Precautions: Aspiration/seizure/fall.  This patient is critically ill and at significant risk of neurological worsening, death and care requires constant monitoring of vital signs, hemodynamics,respiratory and cardiac monitoring, neurological assessment, discussion with family, other specialists and medical decision making of high complexity. I spent 71 minutes of neurocritical care time  in the care of  this patient. This was time spent independent of any time provided by nurse practitioner or PA.  Electronically signed by:  Lynnae Sandhoff, MD Page: [phone] 05/27/2021, 2:41 PM

## 2021-05-27 NOTE — ED Triage Notes (Signed)
At 1300 right side weakness , pt not taking medications since Monday, pt had htn , pt axox4 at this time

## 2021-05-27 NOTE — Progress Notes (Signed)
°  Chaplain On-Call responded to Code Stroke notification at 1424 hours.  Chaplain learned from ED staff members that the patient was receiving CT scan procedure.  ED Staff will contact the Chaplain if any family members arrive.  Chaplain Pollyann Samples M.Div., [facility]

## 2021-05-27 NOTE — ED Provider Notes (Signed)
[facility] Provider Note    Event Date/Time   First MD Initiated Contact with Patient 05/27/21 1455     (approximate)   History   Weakness (Right side weakness, )   HPI  Terri Werner is a 71 y.o. female with hypertension who comes in with concerns for right-sided leg weakness.  Patient was in church today when she developed acute onset of right lower extremity weakness at 1:00.  She reportedly ran of her amlodipine and not gotten any refill.  Denies this ever happening previous, no history of strokes.  She reports taking a baby aspirin daily.  Unclear when she last took it.  I reviewed patient's primary care note from 10/17 with Dr. Ancil Boozer where patient was not on Norvasc secondary to body aches.  They commented that her blood pressure was at goal which was 126/82 at that time.  Physical Exam   Triage Vital Signs: ED Triage Vitals [05/27/21 1500]  Enc Vitals Group     BP (!) 168/97     Pulse      Resp 19     Temp      Temp src      SpO2      Weight      Height      Head Circumference      Peak Flow      Pain Score      Pain Loc      Pain Edu?      Excl. in [place]?     Most recent vital signs: Vitals:   05/27/21 1500  BP: (!) 168/97  Resp: 19     General: Awake, no distress.  Middle-aged female CV:  Good peripheral perfusion.  Normal rate Resp:  Normal effort.  No increased work of breathing Abd:  No distention.  Soft nontender Other:  NIH stroke scale is 5.  Unable to lift the right leg off the bed.  She also has decreased sensation to light touch in the right lower extremity  ED Results / Procedures / Treatments   Labs (all labs ordered are listed, but only abnormal results are displayed) Labs Reviewed  RESP PANEL BY RT-PCR (FLU A&B, COVID) ARPGX2  CBC  DIFFERENTIAL  COMPREHENSIVE METABOLIC PANEL  PROTIME-INR  APTT  CBG MONITORING, ED     EKG  My interpretation of EKG:  Normal sinus rate of 79 without any ST  elevation or T wave inversions except for aVL, normal  RADIOLOGY Patient CT head was reviewed that shows an intraparenchymal hemorrhage. CTA without evidence of aneurysm   PROCEDURES:  Critical Care performed: Yes, see critical care procedure note(s)  .1-3 Lead EKG Interpretation Performed by: Vanessa Winchester, MD Authorized by: Vanessa Severance, MD     Interpretation: normal     ECG rate:  70s   ECG rate assessment: normal     Rhythm: sinus rhythm     Ectopy: none     Conduction: normal   .Critical Care Performed by: Vanessa [place]andale, MD Authorized by: Vanessa , MD   Critical care provider statement:    Critical care time (minutes):  45   Critical care was necessary to treat or prevent imminent or life-threatening deterioration of the following conditions:  CNS failure or compromise   Critical care was time spent personally by me on the following activities:  Development of treatment plan with patient or surrogate, discussions with consultants, evaluation of patient's response to treatment, examination of  patient, ordering and review of laboratory studies, ordering and review of radiographic studies, ordering and performing treatments and interventions, pulse oximetry, re-evaluation of patient's condition and review of [facility]: Medications  nicardipine (CARDENE) 20mg  in 0.86% saline 253ml IV infusion (0.1 mg/ml) (10 mg/hr Intravenous Rate/Dose Change 05/27/21 1532)  sodium chloride flush (NS) 0.9 % injection 3 mL (3 mLs Intravenous Given 05/27/21 1457)  iohexol (OMNIPAQUE) 350 MG/ML injection 75 mL (75 mLs Intravenous Contrast Given 05/27/21 1451)     IMPRESSION / MDM / [employer] / ED COURSE  I reviewed the triage vital signs and the nursing notes.  Patient with hypertension who comes in with right leg weakness.  Differential diagnosis includes, but is not limited to, intraparenchymal hemorrhage, ischemic stroke, brain mass, hypoglycemia,  dehydration low suspicion for dissection given no chest pain or other concurrent symptoms.  Stroke code was called from triage.  CT head did confirm anterior parenchymal hemorrhage.  Patient blood pressure is elevated therefore nicardipine was started through the IV.  Stat neurology consult was obtained due to the stroke code who evaluated patient and recommends transfer to [facility] show no evidence of AKI.  Glucose is normal not hypoglycemic.  The patient is on the cardiac monitor to evaluate for evidence of arrhythmia and/or significant heart rate changes.   3:50 PM I was told by the neurologist Dr. Theda Sers that he had spoken to Dr. Cheral Marker and they recommended ED to ED transfer.  I then called the ED and spoke to a Dr. Benson Norway who stated that he talked to his charge nurse and there was a neuro ICU bed open so pt should go there. They gave me another number for bed control.  I then called the bed control number who states there is a neuro ICU bed open but I need to get an accepting doctor first and I need to call that number back again.   3:55 PM talk to the transfer center again he is going to repage out Dr. Cheral Marker  4:01 PM discussed with Dr. Cheral Marker who accepted patient for transfer. He stated he did not realize they had a neuro ICU bed and if they did then that would be better to just go direct admit there.  I asked if I had to be the one to call back the placement center bed controll number.  The transfer center call coordinator was not sure.  Dr. Yvetta Coder nurse stated I needed to do this.   4:04 PM on the phone with the placement coordinator so they are aware of pt being accepted.   Pt transferred to [facility].                FINAL CLINICAL IMPRESSION(S) / ED DIAGNOSES   Final diagnoses:  Intraparenchymal hematoma of brain, left, with loss of consciousness, initial encounter [facility])     Rx / DC Orders   ED Discharge Orders     None        Note:  This  document was prepared using Dragon voice recognition software and may include unintentional dictation errors.   Vanessa [place], MD 05/27/21 [phone]

## 2021-05-27 NOTE — ED Triage Notes (Signed)
Right side weakness

## 2021-05-28 ENCOUNTER — Inpatient Hospital Stay (HOSPITAL_COMMUNITY): Payer: Medicare Other

## 2021-05-28 DIAGNOSIS — I6389 Other cerebral infarction: Secondary | ICD-10-CM | POA: Diagnosis not present

## 2021-05-28 LAB — URINALYSIS, ROUTINE W REFLEX MICROSCOPIC
Bilirubin Urine: NEGATIVE
Glucose, UA: NEGATIVE mg/dL
Ketones, ur: NEGATIVE mg/dL
Nitrite: POSITIVE — AB
Protein, ur: 30 mg/dL — AB
Specific Gravity, Urine: 1.025 (ref 1.005–1.030)
pH: 5.5 (ref 5.0–8.0)

## 2021-05-28 LAB — RAPID URINE DRUG SCREEN, HOSP PERFORMED
Amphetamines: NOT DETECTED
Barbiturates: NOT DETECTED
Benzodiazepines: NOT DETECTED
Cocaine: NOT DETECTED
Opiates: NOT DETECTED
Tetrahydrocannabinol: NOT DETECTED

## 2021-05-28 LAB — ECHOCARDIOGRAM COMPLETE
AR max vel: 2.24 cm2
AV Area VTI: 2.23 cm2
AV Area mean vel: 2.35 cm2
AV Mean grad: 5 mmHg
AV Peak grad: 9.4 mmHg
Ao pk vel: 1.53 m/s
Area-P 1/2: 2.37 cm2
Height: 62.008 in
S' Lateral: 2.55 cm
Weight: 3679.04 oz

## 2021-05-28 LAB — URINALYSIS, MICROSCOPIC (REFLEX): WBC, UA: >50 WBC/hpf (ref 0–5)

## 2021-05-28 LAB — HIV ANTIBODY (ROUTINE TESTING W REFLEX): HIV Screen 4th Generation wRfx: NONREACTIVE

## 2021-05-28 MED ORDER — PANTOPRAZOLE SODIUM 40 MG PO TBEC
40.0000 mg | DELAYED_RELEASE_TABLET | Freq: Every day | ORAL | Status: DC
  Administered 2021-05-28 – 2021-05-30 (×3): 40 mg via ORAL
  Filled 2021-05-28 (×3): qty 1

## 2021-05-28 MED ORDER — DIPHENHYDRAMINE HCL 25 MG PO CAPS
25.0000 mg | ORAL_CAPSULE | Freq: Once | ORAL | Status: AC
  Administered 2021-05-28: 25 mg via ORAL
  Filled 2021-05-28: qty 1

## 2021-05-28 MED ORDER — TRAMADOL HCL 50 MG PO TABS
50.0000 mg | ORAL_TABLET | Freq: Four times a day (QID) | ORAL | Status: AC | PRN
  Administered 2021-05-28: 50 mg via ORAL
  Filled 2021-05-28: qty 1

## 2021-05-28 NOTE — Progress Notes (Signed)
EEG complete - results pending 

## 2021-05-28 NOTE — Progress Notes (Signed)
Patient had a 7 beat run of Vtach on monitor.  Patient denies any pain or discomfort. No shortness of breath.  Patient currently in SR with HR 73.  MD notified via secure chat.

## 2021-05-28 NOTE — Progress Notes (Signed)
PT Cancellation Note  Patient Details Name: Terri Werner MRN: [id number] DOB: 10/17/1950   Cancelled Treatment:    Reason Eval/Treat Not Completed: Active bedrest order  Wyona Almas, PT, DPT Acute Rehabilitation Services Pager [phone] Office [phone]    Deno Etienne 05/28/2021, 7:17 AM

## 2021-05-28 NOTE — Progress Notes (Signed)
OT Cancellation Note  Patient Details Name: Terri Werner MRN: [id number] DOB: April 28, 1951   Cancelled Treatment:    Reason Eval/Treat Not Completed: Active bedrest order OT order received and appreciated however this conflicts with current bedrest order set. Please increase activity tolerance as appropriate and remove bedrest from orders. . Please contact OT at [phone] if bed rest order is discontinued. OT will hold evaluation at this time and will check back as time allows pending increased activity orders.   Billey Chang, OTR/L  Acute Rehabilitation Services Pager: [phone] Office: [phone] .  05/28/2021, 8:36 AM

## 2021-05-28 NOTE — Progress Notes (Signed)
Patient c/o HA.  States tylenol hasn't been working effectively for HA and she would like something stronger.  Neuro exam unchanged. Paged MD.

## 2021-05-28 NOTE — Evaluation (Signed)
Speech Language Pathology Evaluation Patient Details Name: Terri Werner MRN: [id number] DOB: Apr 05, 1951 Today's Date: 05/28/2021 Time: 9326-7124 SLP Time Calculation (min) (ACUTE ONLY): 20 min  Problem List:  Patient Active Problem List   Diagnosis Date Noted   ICH (intracerebral hemorrhage) (HCC) 05/27/2021   Morbid obesity (HCC) 09/24/2018   Unspecified inflammatory spondylopathy, lumbar region (HCC) 03/05/2018   Lumbar spondylolysis 08/20/2017   Lumbar facet arthropathy 08/20/2017   Lumbar degenerative disc disease 08/20/2017   Chronic constipation 07/30/2017   Primary osteoarthritis involving multiple joints 04/17/2017   Postmenopausal 10/12/2016   Prediabetes 10/12/2016   Lumbago 08/04/2015   Diverticulitis of colon 07/05/2014   Carotid artery disorder (HCC) 04/27/2014   Hyperlipidemia 10/08/2013   Hypertension, benign 11/19/2009   Past Medical History:  Past Medical History:  Diagnosis Date   Arthritis    pt reports in back & knees   Colon polyps 2010   At [facility]   Diverticulitis 2016   Genital warts    GERD (gastroesophageal reflux disease)    Heart murmur    Hepatitis B    History of blood transfusion    during each major surgery per pt   History of chicken pox    History of hiatal hernia    History of torn meniscus of right knee    HTN (hypertension)    Hx of migraine headaches    Hypertension    Hypopotassemia    Past Surgical History:  Past Surgical History:  Procedure Laterality Date   ABDOMINAL HYSTERECTOMY  2006   APPENDECTOMY  1980   CESAREAN SECTION  1981   COLONOSCOPY WITH PROPOFOL N/A 11/01/2014   Procedure: COLONOSCOPY WITH PROPOFOL;  Surgeon: Scot Jun, MD;  Location: [facility] ENDOSCOPY;  Service: Endoscopy;  Laterality: N/A;   COLONOSCOPY WITH PROPOFOL N/A 01/05/2020   Procedure: COLONOSCOPY WITH PROPOFOL;  Surgeon: Regis Bill, MD;  Location: [facility] ENDOSCOPY;  Service: Endoscopy;  Laterality: N/A;   ECTOPIC PREGNANCY  SURGERY  1980   HERNIA REPAIR  2006   Umbilical   KNEE ARTHROSCOPY Left 2006   torn meniscus   TONSILLECTOMY  1971   TOTAL ABDOMINAL HYSTERECTOMY W/ BILATERAL SALPINGOOPHORECTOMY  2006   VESICOVAGINAL FISTULA CLOSURE W/ TAH     HPI:  Terri Werner is a 71 y.o. female past medical history of hypertension and GERD with sudden onset of right leg weakness.  Head CT revealed a high left frontal ICH.   Assessment / Plan / Recommendation Clinical Impression  Pt was seen for a cognitive-linguistic evaluation in the setting of an ICH.  Pt was encountered awake/alert and was agreeable to evaluation.  Pt reported that she lives alone and that she is fully independent with ADLs and IADLs at baseline.  She presents with mild-moderate numeric problem solving deficits relating to financial and time management.   Additional cognitive functions including short-term memory, attention, and safety judgement appeared intact.  Expressive and receptive language were functional, and no dysarthria was observed.  Recommend additional ST targeting problem solving deficits and supervision/assistance with IADLs (particularly medication and financial management) at time of discharge.  Pt was educated regarding results/recommendations and she verbalized understanding.    SLP Assessment  SLP Recommendation/Assessment: Patient needs continued Speech Lanaguage Pathology Services SLP Visit Diagnosis: Cognitive communication deficit (R41.841)    Recommendations for follow up therapy are one component of a multi-disciplinary discharge planning process, led by the attending physician.  Recommendations may be updated based on patient status, additional functional criteria and  insurance authorization.    Follow Up Recommendations  Home health SLP    Assistance Recommended at Discharge  Intermittent Supervision/Assistance  Functional Status Assessment Patient has had a recent decline in their functional status and  demonstrates the ability to make significant improvements in function in a reasonable and predictable amount of time.  Frequency and Duration min 2x/week  2 weeks      SLP Evaluation Cognition  Overall Cognitive Status: Impaired/Different from baseline Arousal/Alertness: Awake/alert Orientation Level: Oriented X4 Attention: Focused;Sustained;Alternating Focused Attention: Appears intact Sustained Attention: Appears intact Alternating Attention: Appears intact Memory: Appears intact Immediate Memory Recall: Sock;Blue;Bed Memory Recall Sock: Without Cue Memory Recall Blue: Without Cue Memory Recall Bed: Without Cue Awareness: Appears intact Problem Solving: Impaired Problem Solving Impairment: Functional complex;Verbal complex Safety/Judgment: Appears intact       Comprehension  Auditory Comprehension Overall Auditory Comprehension: Appears within functional limits for tasks assessed Yes/No Questions: Within Functional Limits Commands: Within Functional Limits Conversation: Complex    Expression Expression Primary Mode of Expression: Verbal Verbal Expression Overall Verbal Expression: Appears within functional limits for tasks assessed Repetition: No impairment Naming: No impairment Pragmatics: No impairment Written Expression Dominant Hand: Right   Oral / Motor  Oral Motor/Sensory Function Overall Oral Motor/Sensory Function: Within functional limits Motor Speech Overall Motor Speech: Appears within functional limits for tasks assessed Respiration: Within functional limits Phonation: Normal Resonance: Within functional limits Articulation: Within functional limitis Intelligibility: Intelligible Motor Planning: Witnin functional limits           Eino Farber, M.S., CCC-SLP Acute Rehabilitation Services Office: [phone]  Shanon Rosser [facility] 05/28/2021, 9:41 AM

## 2021-05-28 NOTE — Procedures (Signed)
[facility] Tele[facility]urology Consult Services  Routine EEG Report  Duration: 21 min  Patient Name:   Terri Werner Date of Birth:   [birth year] Identification Number:   MRN - [id number]  Date of Study:   05/28/2021 08:11:10  Indication: Spells, Eval for Seizures,  Technical Summary: A routine 20 channel electroencephalogram using the international 10-20 system of electrode placement was performed.  Background: 9-10 Hz, Posterior dominant rhythm that attenuated with eye Opening  States       Awake   Activation Procedures  Hyperventilation: Performed :  Photic Stimulation:  Classification: Normal : There were no Epileptiform discharges  Diagnosis: Normal Awake study. There are no epileptiform discharges.  Clinical Correlation: This is a normal study. The absence of interictal epileptiform abnormalities does not exclude the diagnosis of a seizure disorder.      Dr Tsosie Billing   [facility] [phone]  Case [id number]

## 2021-05-28 NOTE — Progress Notes (Signed)
Pt complaining of skin itching on back. Made Dr. Rory Percy aware new order for benadryl given.

## 2021-05-28 NOTE — Progress Notes (Signed)
Echocardiogram 2D Echocardiogram has been performed.  Arlyss Gandy 05/28/2021, 2:47 PM

## 2021-05-28 NOTE — Progress Notes (Addendum)
STROKE TEAM PROGRESS NOTE   INTERVAL HISTORY Patient is resting comfortably in bed. Patient was at church yesterday morning when she said she felt "off" and noticed some spinning sensation. She also noticed that her right leg wasn't working. Since she has been admitted her BP has been stable and neuro exam has been unchanged. Significant weakness in right lower extremity and mild weakness in right hand. Awaiting EEG, Echo, and plan for MRI tomorrow. Lab work this morning is unremarkable. She did have a colonoscopy Monday. Blood and urine cultures ordered.  CT scan of the head shows stable appearance of the left medial high frontal parenchymal hemorrhage with mild cytotoxic edema but no significant midline shift or intraventricular extension.  CT angiogram of the brain and neck showed no significant vessel stenosis, occlusion or aneurysms or AVMs Vitals:   05/28/21 0530 05/28/21 0600 05/28/21 0630 05/28/21 0700  BP: (!) 99/51 (!) 98/51 101/64 116/61  Pulse: 67 61 60 60  Resp: (!) 22 18 14 15   Temp:      TempSrc:      SpO2: 95% 96% 97% 98%  Weight:      Height:       CBC:  Recent Labs  Lab 05/27/21 1458  WBC 5.9  NEUTROABS 3.3  HGB 12.5  HCT 39.8  MCV 89.2  PLT 578   Basic Metabolic Panel:  Recent Labs  Lab 05/27/21 1458  NA 136  K 4.7  CL 106  CO2 25  GLUCOSE 93  BUN 13  CREATININE 0.74  CALCIUM 9.1   Lipid Panel: No results for input(s): CHOL, TRIG, HDL, CHOLHDL, VLDL, LDLCALC in the last 168 hours. HgbA1c: No results for input(s): HGBA1C in the last 168 hours. Urine Drug Screen: No results for input(s): LABOPIA, COCAINSCRNUR, LABBENZ, AMPHETMU, THCU, LABBARB in the last 168 hours.  Alcohol Level No results for input(s): ETH in the last 168 hours.  IMAGING past 24 hours CT ANGIO HEAD NECK W WO CM  Result Date: 05/27/2021 CLINICAL DATA:  Neuro deficit, acute, stroke suspected EXAM: CT ANGIOGRAPHY HEAD AND NECK TECHNIQUE: Multidetector CT imaging of the head and neck was  performed using the standard protocol during bolus administration of intravenous contrast. Multiplanar CT image reconstructions and MIPs were obtained to evaluate the vascular anatomy. Carotid stenosis measurements (when applicable) are obtained utilizing NASCET criteria, using the distal internal carotid diameter as the denominator. RADIATION DOSE REDUCTION: This exam was performed according to the departmental dose-optimization program which includes automated exposure control, adjustment of the mA and/or kV according to patient size and/or use of iterative reconstruction technique. CONTRAST:  66mL OMNIPAQUE IOHEXOL 350 MG/ML SOLN COMPARISON:  None. FINDINGS: CTA NECK Aortic arch: Trace calcified plaque along the arch. Great vessel origins are patent. Direct origin of the left vertebral from the arch. Right carotid system: Patent.  No stenosis. Left carotid system: Patent. Mild calcified plaque at the common carotid bifurcation. No stenosis. Vertebral arteries: Patent. Right vertebral is dominant. No stenosis. Skeleton: Mild cervical spine degenerative changes. Other neck: Unremarkable. Upper chest: No apical lung mass. Review of the MIP images confirms the above findings CTA HEAD There is no abnormal vascularity in the region of hemorrhage. Anterior circulation: Intracranial internal carotid arteries are patent with mild calcified plaque. Anterior and middle cerebral arteries are patent. Posterior circulation: Intracranial vertebral arteries are patent. Basilar artery is patent. Posterior cerebral arteries are patent. Venous sinuses: Patent as allowed by contrast bolus timing. Review of the MIP images confirms the above findings  IMPRESSION: No abnormal vascularity in the region of hemorrhage. No hemodynamically significant stenosis. Electronically Signed   By: Macy Mis M.D.   On: 05/27/2021 15:07   CT HEAD WO CONTRAST (5MM)  Result Date: 05/27/2021 CLINICAL DATA:  Stroke follow-up EXAM: CT HEAD WITHOUT  CONTRAST TECHNIQUE: Contiguous axial images were obtained from the base of the skull through the vertex without intravenous contrast. RADIATION DOSE REDUCTION: This exam was performed according to the departmental dose-optimization program which includes automated exposure control, adjustment of the mA and/or kV according to patient size and/or use of iterative reconstruction technique. COMPARISON:  05/27/2021 FINDINGS: Brain: Intraparenchymal hematoma in the high left frontal lobe is unchanged in size. No midline shift or other mass effect. No new site of hemorrhage. No hydrocephalus. Vascular: No abnormal hyperdensity of the major intracranial arteries or dural venous sinuses. No intracranial atherosclerosis. Skull: The visualized skull base, calvarium and extracranial soft tissues are normal. Sinuses/Orbits: No fluid levels or advanced mucosal thickening of the visualized paranasal sinuses. No mastoid or middle ear effusion. The orbits are normal. IMPRESSION: Unchanged size of high left frontal lobe intraparenchymal hematoma. Electronically Signed   By: Ulyses Jarred M.D.   On: 05/27/2021 23:48   CT VENOGRAM HEAD  Result Date: 05/27/2021 CLINICAL DATA:  Dural venous sinus thrombosis suspected EXAM: CT VENOGRAM HEAD TECHNIQUE: Venographic phase images of the brain were obtained following the administration of intravenous contrast. Multiplanar reformats and maximum intensity projections were generated. RADIATION DOSE REDUCTION: This exam was performed according to the departmental dose-optimization program which includes automated exposure control, adjustment of the mA and/or kV according to patient size and/or use of iterative reconstruction technique. CONTRAST:  60mL OMNIPAQUE IOHEXOL 350 MG/ML SOLN COMPARISON:  None. FINDINGS: Superior sagittal sinus, straight sinus, vein of Galen, and internal cerebral veins are patent. Transverse and sigmoid sinuses are patent. No apparent cortical vein thrombosis but this  cannot be entirely excluded. IMPRESSION: No evidence of dural sinus thrombosis. Electronically Signed   By: Macy Mis M.D.   On: 05/27/2021 16:14   CT HEAD CODE STROKE WO CONTRAST  Result Date: 05/27/2021 CLINICAL DATA:  Code stroke.  Neuro deficit, acute, stroke suspected EXAM: CT HEAD WITHOUT CONTRAST TECHNIQUE: Contiguous axial images were obtained from the base of the skull through the vertex without intravenous contrast. RADIATION DOSE REDUCTION: This exam was performed according to the departmental dose-optimization program which includes automated exposure control, adjustment of the mA and/or kV according to patient size and/or use of iterative reconstruction technique. COMPARISON:  None. FINDINGS: Brain: Acute parenchymal hemorrhage is present in the parasagittal left frontoparietal lobes. Measures approximately 3 x 1.7 x 3.2 cm. There is mild surrounding edema. Minor regional mass effect. Patchy low-density in the supratentorial white matter is nonspecific but may reflect delete chronic microvascular ischemic changes. Ventricles and sulci are normal in size and configuration. No extra-axial fluid collection. Gray-white differentiation is preserved. Vascular: There is mild intracranial atherosclerotic calcification at the skull base. Skull: Unremarkable. Sinuses/Orbits: No acute abnormality. Other: Mastoid air cells are clear. IMPRESSION: Acute parenchymal hemorrhage in the parasagittal left frontoparietal lobes with mild edema. No substantial mass effect. These results were called by telephone at the time of interpretation on 05/27/2021 at 2:40 pm to provider [facility] , who verbally acknowledged these results. Electronically Signed   By: Macy Mis M.D.   On: 05/27/2021 14:40    PHYSICAL EXAM  Physical Exam  Constitutional: Appears well-developed and well-nourished pleasant elderly lady.  Psych: Affect appropriate to situation  Cardiovascular: Normal rate and regular rhythm.   Respiratory: Effort normal, non-labored breathing  Neuro: Mental Status: Patient is awake, alert, oriented to person, place, month, year, and situation. Patient is able to give a clear and coherent history. No signs of aphasia or neglect Cranial Nerves: II: Visual Fields are full. Pupils are equal, round, and reactive to light.   III,IV, VI: EOMI without ptosis or diploplia.  V: Facial sensation is symmetric to temperature VII: Slight facial asymmetry. Right nasolabial fold VIII: Hearing is intact to voice X: Palate elevates symmetrically XI: Shoulder shrug is symmetric. XII: Tongue protrudes midline without atrophy or fasciculations.  Motor: Right hemiparesis with RLE 2/5, RUE 4/5, right lower extremity stiff, motion limited due to weakness and increased tone. LLE 5/5, LUE 5/5 Fine motor control diminished in RUE, hand grasp in right hand 4/5 Sensory: Sensation is symmetric to light touch and temperature in the arms and legs. Tingling sensation reported in the right foot with light touch.  Coordination: Past pointing with right FNF. No ataxia noted. HKS deferred due to weakness.   ASSESSMENT/PLAN [name] is a 71 y.o. female with history of HTN, arthritis, diverticulitis, heart murmur, hep B, and migraines presenting with sudden onset of right leg weakness. She ran out of her medications and had her last dose of amlodipine on Monday. Her prescription ran out and there was some sort of misunderstanding in getting it refilled so she has been taking her Norvasc sporadically instead of daily until she could get it refilled.  She drove herself to church this morning and in the afternoon noted that she could not move her right leg.  She was brought in as an acute code stroke to [facility] hospital where she was found to have a left high frontal ICH.  She did state that she had a colonoscopy Monday and she does have a heart murmur. Last known well 1300 hrs. Head CT  showed an IPH in the left parasagittal frontoparietal lobe. At 2230 on 1/14 RN noted right foot twitching every few minutes lasting over 30 minutes, neurohospitalist notified. Concern for possible seizure, she was given 1mg  of ativan, 2g Keppra load. EEG and Echo pending. Plan for MRI with contrast tomorrow to rule out mets source. PT/OT eval pending as well.   Stroke:   IPH in the left parasagittal frontoparietal lobes likely secondary uncontrolled hypertensive source vs metastatic source doubt endocarditis given history of colonoscopy earlier last week. Code Stroke acute IPH in the parasagittal left frontoparietal lobes with mild edema.  No substantial mass-effect.  No IVH. Repeat CT- unchanged size of Lt frontal lobe IPH CTA head & neck No hemodynamically significant stenosis. CT venogram negative for dural venous sinus thrombus MRI  Pending 2D Echo Pending EEG Pending LDL 101 HgbA1c 5. 4 neurological monitoring and strict blood pressure control with systolic below 106 VTE prophylaxis - SCDs aspirin 81 mg daily prior to admission, now on No antithrombotic r/t IPH Therapy recommendations:  Pending Disposition:  Pending  Hypertension Home meds:  Norvasc 2.5mg   Stable BP systolic less than 269  Hyperlipidemia Home meds:  Rosuvastatin, resumed in hospital LDL 101, goal < 70 Continue statin at discharge  Other Stroke Risk Factors ETOH use, advised to drink no more than 1 drink(s) a day Obesity, Body mass index is 42.05 kg/m., BMI >/= 30 associated with increased stroke risk, recommend weight loss, diet and exercise as appropriate  Hx stroke/TIA  Other Active Problems Possible seizure activity Loaded with 2g  Keppra EEG pending   Hospital day # 1  Patient seen and examined by NP/APP with MD. MD to update note as needed.   Janine Ores, DNP, FNP-BC [facility] Pager: [phone]  STROKE MD NOTE :  I have personally obtained history,examined this patient,  reviewed notes, independently viewed imaging studies, participated in medical decision making and plan of care.ROS completed by me personally and pertinent positives fully documented  I have made any additions or clarifications directly to the above note. Agree with note above.  Patient presented with sudden onset of right leg weakness due to left medial frontal parenchymal hemorrhage etiology indeterminate possibly hypertensive.  Location is slightly atypical for other possibilities endocarditis versus underlying structural lesion.  Neurological exam shows mild right hemiparesis.  Recommend close neurological monitoring and strict blood pressure control with systolic goal in 191-478 range for the first 24 hours and then below 160.  Check MRI scan of the brain with and without contrast tomorrow.  Mobilize out of bed.  Physical occupational and speech therapy consults.  Check echocardiogram.  Blood cultures.This patient is critically ill and at significant risk of neurological worsening, death and care requires constant monitoring of vital signs, hemodynamics,respiratory and cardiac monitoring, extensive review of multiple databases, frequent neurological assessment, discussion with family, other specialists and medical decision making of high complexity.I have made any additions or clarifications directly to the above note.This critical care time does not reflect procedure time, or teaching time or supervisory time of PA/NP/Med Resident etc but could involve care discussion time.  I spent 35 minutes of neurocritical care time  in the care of  this patient.      Antony Contras, MD Medical Director [place] Pager: [phone] 05/28/2021 3:36 PM    To contact Stroke Continuity provider, please refer to [email or web address] After hours, contact General Neurology

## 2021-05-29 ENCOUNTER — Inpatient Hospital Stay (HOSPITAL_COMMUNITY): Payer: Medicare Other

## 2021-05-29 MED ORDER — CEPHALEXIN 500 MG PO CAPS
500.0000 mg | ORAL_CAPSULE | Freq: Four times a day (QID) | ORAL | Status: DC
  Administered 2021-05-29 – 2021-05-31 (×8): 500 mg via ORAL
  Filled 2021-05-29 (×10): qty 1

## 2021-05-29 MED ORDER — GADOBUTROL 1 MMOL/ML IV SOLN
10.0000 mL | Freq: Once | INTRAVENOUS | Status: AC | PRN
  Administered 2021-05-29: 10 mL via INTRAVENOUS

## 2021-05-29 NOTE — Progress Notes (Signed)
Transition of Care [facility]) Screening Note   Patient Details  Name: Terri Werner Date of Birth: 05-22-1950   Transition of Care [facility]) CM/SW Contact:    Mearl Latin, LCSW Phone Number: 05/29/2021, 9:07 AM    Transition of Care Department [facility]) has reviewed patient and no TOC needs have been identified at this time. We will continue to monitor patient advancement through interdisciplinary progression rounds. If new patient transition needs arise, please place a TOC consult.

## 2021-05-29 NOTE — Progress Notes (Addendum)
STROKE TEAM PROGRESS NOTE   INTERVAL HISTORY Patient is resting comfortably in bed. She has just returned from her MRI.  She has been hemodynamically stable, and her neurological exam is unchanged.  Urinalysis was positive for leukocytes, nitrite and bacteria.  Patient reports urinary frequency and cloudy urine, so will treat for UTI.  MRI brain reveals numerous microhemorrhages, and IPH was likely caused by hypertension.  Will repeat MRI in 3 months, no angiogram needed at the present time.  Blood pressure adequately controlled. EEG study was normal without any seizure activity. Vitals:   05/29/21 0800 05/29/21 0835 05/29/21 0900 05/29/21 1000  BP: (!) 162/105 (!) 143/67 (!) 148/71 132/65  Pulse: 73 75 91   Resp: 14 20  16   Temp:      TempSrc:      SpO2:  95% 96%   Weight:      Height:       CBC:  Recent Labs  Lab 05/27/21 1458  WBC 5.9  NEUTROABS 3.3  HGB 12.5  HCT 39.8  MCV 89.2  PLT 947    Basic Metabolic Panel:  Recent Labs  Lab 05/27/21 1458  NA 136  K 4.7  CL 106  CO2 25  GLUCOSE 93  BUN 13  CREATININE 0.74  CALCIUM 9.1    Lipid Panel: No results for input(s): CHOL, TRIG, HDL, CHOLHDL, VLDL, LDLCALC in the last 168 hours. HgbA1c: No results for input(s): HGBA1C in the last 168 hours. Urine Drug Screen:  Recent Labs  Lab 05/28/21 0713  LABOPIA NONE DETECTED  COCAINSCRNUR NONE DETECTED  LABBENZ NONE DETECTED  AMPHETMU NONE DETECTED  THCU NONE DETECTED  LABBARB NONE DETECTED     Alcohol Level No results for input(s): ETH in the last 168 hours.  IMAGING past 24 hours MR BRAIN W WO CONTRAST  Result Date: 05/29/2021 CLINICAL DATA:  Neuro deficit with stroke suspected EXAM: MRI HEAD WITHOUT AND WITH CONTRAST TECHNIQUE: Multiplanar, multiecho pulse sequences of the brain and surrounding structures were obtained without and with intravenous contrast. CONTRAST:  63mL GADAVIST GADOBUTROL 1 MMOL/ML IV SOLN COMPARISON:  Head CT from yesterday FINDINGS: Brain:  Acute parenchymal hemorrhage in the parasagittal posterior left frontal lobe as previously seen. No suspected enlargement. Methemoglobin has formed with T1 shortening. There is an expected rim of edema. No abnormal adjacent enhancement. Innumerable chronic microhemorrhages in the peripheral brain. Brain volume is normal. Chronic small vessel ischemic type change in the hemispheric white matter. Partially empty sella, nonspecific in isolation. Vascular: Normal flow voids and vascular enhancements Skull and upper cervical spine: Normal marrow signal Sinuses/Orbits: Negative IMPRESSION: No mass or infarct seen underlying the left posterior frontal hematoma. There are however signs of innumerable remote lobar microhemorrhages and white matter gliosis, compatible with amyloid angiopathy or other primary vascular disease. Electronically Signed   By: Jorje Guild M.D.   On: 05/29/2021 10:37   EEG adult  Result Date: 05/28/2021 Terri Billing, MD     05/28/2021  7:59 PM [facility] Routine EEG Report Duration: 21 min Patient Name:   Terri Werner Date of Birth:   28-Jun-1950 Identification Number:   MRN - [id number] Date of Study:   05/28/2021 08:11:10 Indication: Spells, Eval for Seizures, Technical Summary: A routine 20 channel electroencephalogram using the international 10-20 system of electrode placement was performed. Background: 9-10 Hz, Posterior dominant rhythm that attenuated with eye Opening States      Awake Activation Procedures Hyperventilation: Performed : Photic Stimulation: Classification: Normal :  STROKE TEAM PROGRESS NOTE   INTERVAL HISTORY Patient is resting comfortably in bed. She has just returned from her MRI.  She has been hemodynamically stable, and her neurological exam is unchanged.  Urinalysis was positive for leukocytes, nitrite and bacteria.  Patient reports urinary frequency and cloudy urine, so will treat for UTI.  MRI brain reveals numerous microhemorrhages, and IPH was likely caused by hypertension.  Will repeat MRI in 3 months, no angiogram needed at the present time.  Blood pressure adequately controlled. EEG study was normal without any seizure activity. Vitals:   05/29/21 0800 05/29/21 0835 05/29/21 0900 05/29/21 1000  BP: (!) 162/105 (!) 143/67 (!) 148/71 132/65  Pulse: 73 75 91   Resp: 14 20  16   Temp:      TempSrc:      SpO2:  95% 96%   Weight:      Height:       CBC:  Recent Labs  Lab 05/27/21 1458  WBC 5.9  NEUTROABS 3.3  HGB 12.5  HCT 39.8  MCV 89.2  PLT 947    Basic Metabolic Panel:  Recent Labs  Lab 05/27/21 1458  NA 136  K 4.7  CL 106  CO2 25  GLUCOSE 93  BUN 13  CREATININE 0.74  CALCIUM 9.1    Lipid Panel: No results for input(s): CHOL, TRIG, HDL, CHOLHDL, VLDL, LDLCALC in the last 168 hours. HgbA1c: No results for input(s): HGBA1C in the last 168 hours. Urine Drug Screen:  Recent Labs  Lab 05/28/21 0713  LABOPIA NONE DETECTED  COCAINSCRNUR NONE DETECTED  LABBENZ NONE DETECTED  AMPHETMU NONE DETECTED  THCU NONE DETECTED  LABBARB NONE DETECTED     Alcohol Level No results for input(s): ETH in the last 168 hours.  IMAGING past 24 hours MR BRAIN W WO CONTRAST  Result Date: 05/29/2021 CLINICAL DATA:  Neuro deficit with stroke suspected EXAM: MRI HEAD WITHOUT AND WITH CONTRAST TECHNIQUE: Multiplanar, multiecho pulse sequences of the brain and surrounding structures were obtained without and with intravenous contrast. CONTRAST:  63mL GADAVIST GADOBUTROL 1 MMOL/ML IV SOLN COMPARISON:  Head CT from yesterday FINDINGS: Brain:  Acute parenchymal hemorrhage in the parasagittal posterior left frontal lobe as previously seen. No suspected enlargement. Methemoglobin has formed with T1 shortening. There is an expected rim of edema. No abnormal adjacent enhancement. Innumerable chronic microhemorrhages in the peripheral brain. Brain volume is normal. Chronic small vessel ischemic type change in the hemispheric white matter. Partially empty sella, nonspecific in isolation. Vascular: Normal flow voids and vascular enhancements Skull and upper cervical spine: Normal marrow signal Sinuses/Orbits: Negative IMPRESSION: No mass or infarct seen underlying the left posterior frontal hematoma. There are however signs of innumerable remote lobar microhemorrhages and white matter gliosis, compatible with amyloid angiopathy or other primary vascular disease. Electronically Signed   By: Jorje Guild M.D.   On: 05/29/2021 10:37   EEG adult  Result Date: 05/28/2021 Terri Billing, MD     05/28/2021  7:59 PM [facility] Routine EEG Report Duration: 21 min Patient Name:   Terri Werner Date of Birth:   28-Jun-1950 Identification Number:   MRN - [id number] Date of Study:   05/28/2021 08:11:10 Indication: Spells, Eval for Seizures, Technical Summary: A routine 20 channel electroencephalogram using the international 10-20 system of electrode placement was performed. Background: 9-10 Hz, Posterior dominant rhythm that attenuated with eye Opening States      Awake Activation Procedures Hyperventilation: Performed : Photic Stimulation: Classification: Normal :  STROKE TEAM PROGRESS NOTE   INTERVAL HISTORY Patient is resting comfortably in bed. She has just returned from her MRI.  She has been hemodynamically stable, and her neurological exam is unchanged.  Urinalysis was positive for leukocytes, nitrite and bacteria.  Patient reports urinary frequency and cloudy urine, so will treat for UTI.  MRI brain reveals numerous microhemorrhages, and IPH was likely caused by hypertension.  Will repeat MRI in 3 months, no angiogram needed at the present time.  Blood pressure adequately controlled. EEG study was normal without any seizure activity. Vitals:   05/29/21 0800 05/29/21 0835 05/29/21 0900 05/29/21 1000  BP: (!) 162/105 (!) 143/67 (!) 148/71 132/65  Pulse: 73 75 91   Resp: 14 20  16   Temp:      TempSrc:      SpO2:  95% 96%   Weight:      Height:       CBC:  Recent Labs  Lab 05/27/21 1458  WBC 5.9  NEUTROABS 3.3  HGB 12.5  HCT 39.8  MCV 89.2  PLT 947    Basic Metabolic Panel:  Recent Labs  Lab 05/27/21 1458  NA 136  K 4.7  CL 106  CO2 25  GLUCOSE 93  BUN 13  CREATININE 0.74  CALCIUM 9.1    Lipid Panel: No results for input(s): CHOL, TRIG, HDL, CHOLHDL, VLDL, LDLCALC in the last 168 hours. HgbA1c: No results for input(s): HGBA1C in the last 168 hours. Urine Drug Screen:  Recent Labs  Lab 05/28/21 0713  LABOPIA NONE DETECTED  COCAINSCRNUR NONE DETECTED  LABBENZ NONE DETECTED  AMPHETMU NONE DETECTED  THCU NONE DETECTED  LABBARB NONE DETECTED     Alcohol Level No results for input(s): ETH in the last 168 hours.  IMAGING past 24 hours MR BRAIN W WO CONTRAST  Result Date: 05/29/2021 CLINICAL DATA:  Neuro deficit with stroke suspected EXAM: MRI HEAD WITHOUT AND WITH CONTRAST TECHNIQUE: Multiplanar, multiecho pulse sequences of the brain and surrounding structures were obtained without and with intravenous contrast. CONTRAST:  63mL GADAVIST GADOBUTROL 1 MMOL/ML IV SOLN COMPARISON:  Head CT from yesterday FINDINGS: Brain:  Acute parenchymal hemorrhage in the parasagittal posterior left frontal lobe as previously seen. No suspected enlargement. Methemoglobin has formed with T1 shortening. There is an expected rim of edema. No abnormal adjacent enhancement. Innumerable chronic microhemorrhages in the peripheral brain. Brain volume is normal. Chronic small vessel ischemic type change in the hemispheric white matter. Partially empty sella, nonspecific in isolation. Vascular: Normal flow voids and vascular enhancements Skull and upper cervical spine: Normal marrow signal Sinuses/Orbits: Negative IMPRESSION: No mass or infarct seen underlying the left posterior frontal hematoma. There are however signs of innumerable remote lobar microhemorrhages and white matter gliosis, compatible with amyloid angiopathy or other primary vascular disease. Electronically Signed   By: Jorje Guild M.D.   On: 05/29/2021 10:37   EEG adult  Result Date: 05/28/2021 Terri Billing, MD     05/28/2021  7:59 PM [facility] Routine EEG Report Duration: 21 min Patient Name:   Terri Werner Date of Birth:   28-Jun-1950 Identification Number:   MRN - [id number] Date of Study:   05/28/2021 08:11:10 Indication: Spells, Eval for Seizures, Technical Summary: A routine 20 channel electroencephalogram using the international 10-20 system of electrode placement was performed. Background: 9-10 Hz, Posterior dominant rhythm that attenuated with eye Opening States      Awake Activation Procedures Hyperventilation: Performed : Photic Stimulation: Classification: Normal :  STROKE TEAM PROGRESS NOTE   INTERVAL HISTORY Patient is resting comfortably in bed. She has just returned from her MRI.  She has been hemodynamically stable, and her neurological exam is unchanged.  Urinalysis was positive for leukocytes, nitrite and bacteria.  Patient reports urinary frequency and cloudy urine, so will treat for UTI.  MRI brain reveals numerous microhemorrhages, and IPH was likely caused by hypertension.  Will repeat MRI in 3 months, no angiogram needed at the present time.  Blood pressure adequately controlled. EEG study was normal without any seizure activity. Vitals:   05/29/21 0800 05/29/21 0835 05/29/21 0900 05/29/21 1000  BP: (!) 162/105 (!) 143/67 (!) 148/71 132/65  Pulse: 73 75 91   Resp: 14 20  16   Temp:      TempSrc:      SpO2:  95% 96%   Weight:      Height:       CBC:  Recent Labs  Lab 05/27/21 1458  WBC 5.9  NEUTROABS 3.3  HGB 12.5  HCT 39.8  MCV 89.2  PLT 947    Basic Metabolic Panel:  Recent Labs  Lab 05/27/21 1458  NA 136  K 4.7  CL 106  CO2 25  GLUCOSE 93  BUN 13  CREATININE 0.74  CALCIUM 9.1    Lipid Panel: No results for input(s): CHOL, TRIG, HDL, CHOLHDL, VLDL, LDLCALC in the last 168 hours. HgbA1c: No results for input(s): HGBA1C in the last 168 hours. Urine Drug Screen:  Recent Labs  Lab 05/28/21 0713  LABOPIA NONE DETECTED  COCAINSCRNUR NONE DETECTED  LABBENZ NONE DETECTED  AMPHETMU NONE DETECTED  THCU NONE DETECTED  LABBARB NONE DETECTED     Alcohol Level No results for input(s): ETH in the last 168 hours.  IMAGING past 24 hours MR BRAIN W WO CONTRAST  Result Date: 05/29/2021 CLINICAL DATA:  Neuro deficit with stroke suspected EXAM: MRI HEAD WITHOUT AND WITH CONTRAST TECHNIQUE: Multiplanar, multiecho pulse sequences of the brain and surrounding structures were obtained without and with intravenous contrast. CONTRAST:  63mL GADAVIST GADOBUTROL 1 MMOL/ML IV SOLN COMPARISON:  Head CT from yesterday FINDINGS: Brain:  Acute parenchymal hemorrhage in the parasagittal posterior left frontal lobe as previously seen. No suspected enlargement. Methemoglobin has formed with T1 shortening. There is an expected rim of edema. No abnormal adjacent enhancement. Innumerable chronic microhemorrhages in the peripheral brain. Brain volume is normal. Chronic small vessel ischemic type change in the hemispheric white matter. Partially empty sella, nonspecific in isolation. Vascular: Normal flow voids and vascular enhancements Skull and upper cervical spine: Normal marrow signal Sinuses/Orbits: Negative IMPRESSION: No mass or infarct seen underlying the left posterior frontal hematoma. There are however signs of innumerable remote lobar microhemorrhages and white matter gliosis, compatible with amyloid angiopathy or other primary vascular disease. Electronically Signed   By: Jorje Guild M.D.   On: 05/29/2021 10:37   EEG adult  Result Date: 05/28/2021 Terri Billing, MD     05/28/2021  7:59 PM [facility] Routine EEG Report Duration: 21 min Patient Name:   Terri Werner Date of Birth:   28-Jun-1950 Identification Number:   MRN - [id number] Date of Study:   05/28/2021 08:11:10 Indication: Spells, Eval for Seizures, Technical Summary: A routine 20 channel electroencephalogram using the international 10-20 system of electrode placement was performed. Background: 9-10 Hz, Posterior dominant rhythm that attenuated with eye Opening States      Awake Activation Procedures Hyperventilation: Performed : Photic Stimulation: Classification: Normal :  There were no Epileptiform discharges Diagnosis: Normal Awake study. There are no epileptiform discharges. Clinical Correlation: This is a normal study. The absence of interictal epileptiform abnormalities does not exclude the diagnosis of a seizure disorder. Dr Terri Werner [facility] [phone] Case [id number]  ECHOCARDIOGRAM  COMPLETE  Result Date: 05/28/2021    ECHOCARDIOGRAM REPORT   Patient Name:   Terri Werner Date of Exam: 05/28/2021 Medical Rec #:  [id number]                   Height:       62.0 in Accession #:    [id number]                  Weight:       229.9 lb Date of Birth:  [birth year]                   BSA:          2.028 m Patient Age:    71 years                    BP:           116/61 mmHg Patient Gender: F                           HR:           60 bpm. Exam Location:  Inpatient Procedure: 2D Echo Indications:    CVA  History:        Patient has no prior history of Echocardiogram examinations.                 Signs/Symptoms:Murmur; Risk Factors:Hypertension.  Sonographer:    Arlyss Gandy Referring Phys: [place]  1. Left ventricular ejection fraction, by estimation, is 60 to 65%. The left ventricle has normal function. The left ventricle has no regional wall motion abnormalities. There is mild concentric left ventricular hypertrophy. Left ventricular diastolic parameters are consistent with Grade I diastolic dysfunction (impaired relaxation).  2. Right ventricular systolic function is normal. The right ventricular size is normal. There is normal pulmonary artery systolic pressure.  3. The mitral valve is normal in structure. Trivial mitral valve regurgitation.  4. The aortic valve is tricuspid. There is mild calcification of the aortic valve. There is mild thickening of the aortic valve. Aortic valve regurgitation is not visualized. Aortic valve sclerosis/calcification is present, without any evidence of aortic stenosis. Comparison(s): No prior Echocardiogram. Conclusion(s)/Recommendation(s): No intracardiac source of embolism detected on this transthoracic study. Consider a transesophageal echocardiogram to exclude cardiac source of embolism if clinically indicated. FINDINGS  Left Ventricle: Left ventricular ejection fraction, by estimation, is 60 to 65%. The left ventricle has normal  function. The left ventricle has no regional wall motion abnormalities. The left ventricular internal cavity size was normal in size. There is  mild concentric left ventricular hypertrophy. Left ventricular diastolic parameters are consistent with Grade I diastolic dysfunction (impaired relaxation). Right Ventricle: The right ventricular size is normal. No increase in right ventricular wall thickness. Right ventricular systolic function is normal. There is normal pulmonary artery systolic pressure. The tricuspid regurgitant velocity is 1.90 m/s, and  with an assumed right atrial pressure of 3 mmHg, the estimated right ventricular systolic pressure is 32.9 mmHg. Left Atrium: Left atrial size was normal in size. Right Atrium: Right atrial size was normal in size. Pericardium: There is no evidence of pericardial

## 2021-05-29 NOTE — TOC CAGE-AID Note (Signed)
Transition of Care [facility]) - CAGE-AID Screening   Patient Details  Name: Terri Werner MRN: [id number] Date of Birth: [birth year]  Transition [facility] CM/SW Contact:    Bethann Berkshire, Ward Phone Number: 05/29/2021, 11:22 AM   Clinical Narrative:  CAGE-AID completed; score of 0. Pt reports she does not use alcohol or any other substances.   CAGE-AID Screening:    Have You Ever Felt You Ought to Cut Down on Your Drinking or Drug Use?: No Have People Annoyed You By Critizing Your Drinking Or Drug Use?: No Have You Felt Bad Or Guilty About Your Drinking Or Drug Use?: No Have You Ever Had a Drink or Used Drugs First Thing In The Morning to Steady Your Nerves or to Get Rid of a Hangover?: No CAGE-AID Score: 0  Substance Abuse Education Offered: No

## 2021-05-29 NOTE — Evaluation (Signed)
Occupational Therapy Evaluation Patient Details Name: Terri Werner MRN: [id number] DOB: 1950/10/06 Today's Date: 05/29/2021   History of Present Illness 71 yo female presents to ED on 1/14 with R sided weakness due to L high frontal ICH with mild cytotoxic edema. PMH includes OA, diverticulitis, hep B, HTN.   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 Pt currently requiring Mod-Max A for UB ADLs, Total A for LB ADLs, and Mod-Max A +2 for functional transfers.  Pt presenting with decreased cognition, balance, strength, attention to R, and coordination. Pt highly motivated to participate in therapy and has good family support. Pt would benefit from further acute OT to facilitate safe dc. Recommend dc to AIR for further OT to optimize safety, independence with ADLs, and return to PLOF.      Recommendations for follow up therapy are one component of a multi-disciplinary discharge planning process, led by the attending physician.  Recommendations may be updated based on patient status, additional functional criteria and insurance authorization.   Follow Up Recommendations  Acute inpatient rehab (3hours/day)    Assistance Recommended at Discharge Frequent or constant Supervision/Assistance  Patient can return home with the following      Functional Status Assessment  Patient has had a recent decline in their functional status and demonstrates the ability to make significant improvements in function in a reasonable and predictable amount of time.  Equipment Recommendations  Other (comment) (Defer to next venue)    Recommendations for Other Services PT consult     Precautions / Restrictions Precautions Precautions: Fall      Mobility Bed Mobility Overal bed mobility: Needs Assistance Bed Mobility: Supine to Sit     Supine to sit: Mod assist;+2 for physical assistance;+2 for safety/equipment;HOB  elevated     General bed mobility comments: Mod A for managing RLE and then elevating trunk.    Transfers Overall transfer level: Needs assistance Equipment used: 2 person hand held assist Transfers: Sit to/from Stand;Bed to chair/wheelchair/BSC Sit to Stand: Mod assist;+2 physical assistance;+2 safety/equipment;From elevated surface Stand pivot transfers: Max assist;+2 physical assistance;+2 safety/equipment         General transfer comment: Mod A +2 for power up into standing with significant assistance to reduce buckling on RLE. Max A +2 for maintaining balance during pivot ot recliner. Pivoting to R. Poor body awareness duirng sitting and requiring increased cues      Balance Overall balance assessment: Needs assistance Sitting-balance support: Single extremity supported;Feet supported;No upper extremity supported Sitting balance-Leahy Scale: Poor Sitting balance - Comments: Pt with slight R lateral and posturior lean. Able to correct with verbal cues.   Standing balance support: Bilateral upper extremity supported;During functional activity Standing balance-Leahy Scale: Poor                             ADL either performed or assessed with clinical judgement   ADL Overall ADL's : Needs assistance/impaired Eating/Feeding: Minimal assistance;Sitting   Grooming: Moderate assistance;Sitting   Upper Body Bathing: Moderate assistance;Sitting   Lower Body Bathing: Total assistance;Sit to/from stand;+2 for physical assistance;+2 for safety/equipment   Upper Body Dressing : Maximal assistance;Sitting   Lower Body Dressing: Total assistance;Sit to/from stand;+2 for physical assistance;+2 for safety/equipment   Toilet Transfer: Maximal assistance;+2 for physical assistance;+2 for safety/equipment;Stand-pivot (simulated to recliner)             General ADL Comments:  Pt very motivated. Poor attention to R and lateral leaning.     Vision Baseline  Vision/History: 1 Wears glasses Patient Visual Report: No change from baseline       Perception     Praxis      Pertinent Vitals/Pain Pain Assessment: No/denies pain     Hand Dominance Right   Extremity/Trunk Assessment Upper Extremity Assessment Upper Extremity Assessment: RUE deficits/detail RUE Deficits / Details: Decreased strength. Able to perform ROM and finger opposition with increased time. decreased sensation. poor proprioception and attention to RUE RUE Sensation: decreased proprioception RUE Coordination: decreased fine motor;decreased gross motor   Lower Extremity Assessment Lower Extremity Assessment: Defer to PT evaluation       Communication Communication Communication: No difficulties   Cognition Arousal/Alertness: Awake/alert Behavior During Therapy: WFL for tasks assessed/performed Overall Cognitive Status: Impaired/Different from baseline Area of Impairment: Awareness;Problem solving;Following commands                       Following Commands: Follows multi-step commands inconsistently;Follows one step commands with increased time   Awareness: Emergent Problem Solving: Slow processing;Difficulty sequencing;Requires verbal cues General Comments: At begining of session, pt reporting she is independent and working. At end of session, however, pt reporitng she retired in [place]. Demonstrating decreased awareness and problem solving within conversation. Requiring increased cues during balance activity due to leaning and decreased bosy awareness. Pt able to correct posture in sitting with Max cues.     General Comments  VSS    Exercises     Shoulder Instructions      Home Living Family/patient expects to be discharged to:: Private residence Living Arrangements: Alone Available Help at Discharge: Family;Available PRN/intermittently (daughter, niece; work from home) Type of Home: House Home Access: Ramped entrance     Home Layout: One  level     Bathroom Shower/Tub: Walk-in Soil scientist Toilet: Handicapped height     Home Equipment: Shower seat;Rolling Environmental consultant (2 wheels)      Lives With: Alone    Prior Functioning/Environment Prior Level of Function : Independent/Modified Independent;Driving             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          OT Problem List: Decreased strength;Decreased range of motion;Decreased activity tolerance;Impaired balance (sitting and/or standing);Decreased knowledge of precautions;Decreased knowledge of use of DME or AE;Pain      OT Treatment/Interventions: Self-care/ADL training;Therapeutic exercise;Energy conservation;DME and/or AE instruction;Therapeutic activities;Patient/family education    OT Goals(Current goals can be found in the care plan section) Acute Rehab OT Goals Patient Stated Goal: Get back home OT Goal Formulation: With patient Time For Goal Achievement: 06/12/21 Potential to Achieve Goals: Good  OT Frequency: Min 2X/week    Co-evaluation PT/OT/SLP Co-Evaluation/Treatment: Yes Reason for Co-Treatment: For patient/therapist safety;To address functional/ADL transfers   OT goals addressed during session: ADL's and self-care      AM-PAC OT "6 Clicks" Daily Activity     Outcome Measure Help from another person eating meals?: A Little Help from another person taking care of personal grooming?: A Lot Help from another person toileting, which includes using toliet, bedpan, or urinal?: A Lot Help from another person bathing (including washing, rinsing, drying)?: A Lot Help from another person to put on and taking off regular upper body clothing?: A Lot Help from another person to put on and taking off regular lower body clothing?: Total 6 Click Score:  12   End of Session Equipment Utilized During Treatment: Gait belt Nurse Communication: Mobility status;Other (comment);Need for lift  equipment (use stedy for back to bed)  Activity Tolerance: Patient tolerated treatment well Patient left: in chair;with call bell/phone within reach;with chair alarm set  OT Visit Diagnosis: Unsteadiness on feet (R26.81);Other abnormalities of gait and mobility (R26.89);Muscle weakness (generalized) (M62.81)                Time: 1610-9604 OT Time Calculation (min): 26 min Charges:  OT General Charges $OT Visit: 1 Visit OT Evaluation $OT Eval Moderate Complexity: 1 Mod  Wilberth Damon MSOT, OTR/L Acute Rehab Pager: [phone] Office: [phone]  Theodoro Grist Daylen Hack 05/29/2021, 10:53 AM

## 2021-05-29 NOTE — Progress Notes (Signed)
Report given to Margot Ables RN.

## 2021-05-29 NOTE — Progress Notes (Signed)
Inpatient Rehabilitation Admissions Coordinator   I met at bedside with patient for rehab assessment. We discussed goals and expectations of a possible CIR admit. She is recently retired and lives alone. She will discuss with her daughters and sister and I will follow up tomorrow.  Danne Baxter, RN, MSN Rehab Admissions Coordinator [phone] 05/29/2021 2:41 PM

## 2021-05-29 NOTE — Evaluation (Signed)
Physical Therapy Evaluation Patient Details Name: Terri Werner MRN: [id number] DOB: 08/30/1950 Today's Date: 05/29/2021  History of Present Illness  71 yo female presents to ED on 1/14 with R sided weakness due to L high frontal ICH with mild cytotoxic edema. PMH includes OA, diverticulitis, hep B, HTN.  Clinical Impression   Pt presents with R hemiparesis LE weaker than UE, impaired sitting and standing balance, max difficulty transferring OOB, and decreased activity tolerance vs baseline. Pt to benefit from acute PT to address deficits. Pt requiring mod-max +2 for transfer OOB to recliner at bedside, pt limited by heavy RLE buckling requiring amx assist to correct and significant R lateral leaning. PT to progress mobility as tolerated, and will continue to follow acutely.         Recommendations for follow up therapy are one component of a multi-disciplinary discharge planning process, led by the attending physician.  Recommendations may be updated based on patient status, additional functional criteria and insurance authorization.  Follow Up Recommendations Acute inpatient rehab (3hours/day)    Assistance Recommended at Discharge Frequent or constant Supervision/Assistance  Patient can return home with the following  Direct supervision/assist for medications management;Direct supervision/assist for financial management;Two people to help with walking and/or transfers;Two people to help with bathing/dressing/bathroom;Help with stairs or ramp for entrance;Assist for transportation    Equipment Recommendations Other (comment) (tbd)  Recommendations for Other Services       Functional Status Assessment Patient has had a recent decline in their functional status and demonstrates the ability to make significant improvements in function in a reasonable and predictable amount of time.     Precautions / Restrictions Precautions Precautions: Fall Restrictions Weight Bearing  Restrictions: No      Mobility  Bed Mobility Overal bed mobility: Needs Assistance Bed Mobility: Supine to Sit     Supine to sit: Mod assist;+2 for physical assistance;+2 for safety/equipment;HOB elevated     General bed mobility comments: Mod A for managing RLE and then elevating trunk.    Transfers Overall transfer level: Needs assistance Equipment used: 2 person hand held assist Transfers: Sit to/from Stand;Bed to chair/wheelchair/BSC Sit to Stand: Mod assist;+2 physical assistance;+2 safety/equipment;From elevated surface Stand pivot transfers: Max assist;+2 physical assistance;+2 safety/equipment         General transfer comment: Mod A +2 for power up into standing with significant assistance to reduce buckling on RLE. Max A +2 for maintaining balance during pivot ot recliner. Pivoting to R. Poor body awareness duirng sitting and requiring increased cues    Ambulation/Gait                  Stairs            Wheelchair Mobility    Modified Rankin (Stroke Patients Only) Modified Rankin (Stroke Patients Only) Pre-Morbid Rankin Score: No symptoms Modified Rankin: Severe disability     Balance Overall balance assessment: Needs assistance Sitting-balance support: Single extremity supported;Feet supported;No upper extremity supported Sitting balance-Leahy Scale: Poor Sitting balance - Comments: Pt with slight R lateral and posturior lean. Able to correct with verbal cues.   Standing balance support: Bilateral upper extremity supported;During functional activity;Reliant on assistive device for balance Standing balance-Leahy Scale: Poor                               Pertinent Vitals/Pain Pain Assessment: Faces Faces Pain Scale: No hurt Pain Intervention(s): Monitored during session    Home  Living Family/patient expects to be discharged to:: Private residence Living Arrangements: Alone Available Help at Discharge: Family;Available  PRN/intermittently (daughter, niece; work from home) Type of Home: House Home Access: Ramped entrance       Home Layout: One level Home Equipment: Shower seat;Rolling Environmental consultant (2 wheels)      Prior Function Prior Level of Function : Independent/Modified Independent;Driving             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       Hand Dominance   Dominant Hand: Right    Extremity/Trunk Assessment   Upper Extremity Assessment Upper Extremity Assessment: Defer to OT evaluation    Lower Extremity Assessment Lower Extremity Assessment: RLE deficits/detail RLE Deficits / Details: 2/5 knee extension/extension, 1/5 hip flexion, 1/5 DF/PF. hypertonicity in hip adduction RLE Sensation: decreased light touch    Cervical / Trunk Assessment Cervical / Trunk Assessment: Normal  Communication   Communication: No difficulties  Cognition Arousal/Alertness: Awake/alert Behavior During Therapy: WFL for tasks assessed/performed Overall Cognitive Status: Impaired/Different from baseline Area of Impairment: Awareness;Problem solving;Following commands                       Following Commands: Follows multi-step commands inconsistently;Follows one step commands with increased time   Awareness: Emergent Problem Solving: Slow processing;Difficulty sequencing;Requires verbal cues General Comments: At begining of session, pt reporting she is independent and working. At end of session, however, pt reporitng she retired in November. Demonstrating decreased awareness and problem solving within conversation. Requiring increased cues during balance activity due to leaning and decreased bosy awareness. Pt able to correct posture in sitting with Max cues.        General Comments      Exercises     Assessment/Plan    PT Assessment Patient needs continued PT services  PT Problem List Decreased strength;Decreased  mobility;Decreased safety awareness;Decreased activity tolerance;Decreased balance;Decreased knowledge of use of DME;Pain;Impaired tone;Decreased coordination;Impaired sensation;Obesity       PT Treatment Interventions DME instruction;Therapeutic activities;Gait training;Therapeutic exercise;Patient/family education;Balance training;Functional mobility training;Neuromuscular re-education    PT Goals (Current goals can be found in the Care Plan section)  Acute Rehab PT Goals Patient Stated Goal: get back to my independence PT Goal Formulation: With patient Time For Goal Achievement: 06/12/21 Potential to Achieve Goals: Good    Frequency Min 4X/week     Co-evaluation PT/OT/SLP Co-Evaluation/Treatment: Yes Reason for Co-Treatment: For patient/therapist safety;To address functional/ADL transfers PT goals addressed during session: Mobility/safety with mobility;Balance         AM-PAC PT "6 Clicks" Mobility  Outcome Measure Help needed turning from your back to your side while in a flat bed without using bedrails?: A Lot Help needed moving from lying on your back to sitting on the side of a flat bed without using bedrails?: A Lot Help needed moving to and from a bed to a chair (including a wheelchair)?: Total Help needed standing up from a chair using your arms (e.g., wheelchair or bedside chair)?: Total Help needed to walk in hospital room?: Total Help needed climbing 3-5 steps with a railing? : Total 6 Click Score: 8    End of Session Equipment Utilized During Treatment: Gait belt Activity Tolerance: Patient tolerated treatment well;Patient limited by fatigue Patient left: in chair;with call bell/phone within reach;with chair alarm set;with family/visitor present Nurse Communication: Mobility status;Other (comment) (stedy +2 for back to bed) PT Visit Diagnosis: Other abnormalities of gait and mobility (  R26.89);Hemiplegia and hemiparesis Hemiplegia - Right/Left: Right Hemiplegia -  dominant/non-dominant: Dominant Hemiplegia - caused by: Nontraumatic intracerebral hemorrhage    Time: 9604-5409 PT Time Calculation (min) (ACUTE ONLY): 26 min   Charges:   PT Evaluation $PT Eval Moderate Complexity: 1 Mod         Arlanda Shiplett S, PT DPT Acute Rehabilitation Services Pager [phone]  Office [phone]   Herma Uballe E Christain [place] 05/29/2021, 2:01 PM

## 2021-05-29 NOTE — Progress Notes (Signed)
? ?  Inpatient Rehab Admissions Coordinator : ? ?Per therapy recommendations, patient was screened for CIR candidacy by Carston Riedl RN MSN.  At this time patient appears to be a potential candidate for CIR. I will place a rehab consult per protocol for full assessment. Please call me with any questions. ? ?Jeani Fassnacht RN MSN ?Admissions Coordinator ?[phone] ?  ?

## 2021-05-30 NOTE — Progress Notes (Signed)
Physical Therapy Treatment Patient Details Name: Terri Werner MRN: [id number] DOB: [birth year] Today's Date: 05/30/2021   History of Present Illness 71 yo female presents to ED on 1/14 with R sided weakness due to L high frontal ICH with mild cytotoxic edema. PMH includes OA, diverticulitis, hep B, HTN.    PT Comments    Pt eager to mobilize. Pt with improving awareness and correction of preference for R lateral leaning. Pt demonstrating RLE extensor synergy in standing, requires hip and knee flexion from PT to break. Pt also with RLE buckling in stance phase during stepping towards recliner, moderate PT assist required to correct. Pt overall requiring mod +2 assist for step pivot to recliner today, an improvement vs yesterday. Pt remains an excellent candidate for AIR, has wonderful family support and pt is motivated to progress mobility.    Recommendations for follow up therapy are one component of a multi-disciplinary discharge planning process, led by the attending physician.  Recommendations may be updated based on patient status, additional functional criteria and insurance authorization.  Follow Up Recommendations  Acute inpatient rehab (3hours/day)     Assistance Recommended at Discharge Frequent or constant Supervision/Assistance  Patient can return home with the following Direct supervision/assist for medications management;Direct supervision/assist for financial management;Two people to help with walking and/or transfers;Two people to help with bathing/dressing/bathroom;Help with stairs or ramp for entrance;Assist for transportation   Equipment Recommendations  Other (comment) (tbd)    Recommendations for Other Services       Precautions / Restrictions Precautions Precautions: Fall Precaution Comments: R hemiparesis, extensor synergy dynamically RLE Restrictions Weight Bearing Restrictions: No     Mobility  Bed Mobility Overal bed mobility: Needs  Assistance Bed Mobility: Supine to Sit     Supine to sit: Mod assist     General bed mobility comments: assist for sequencing, RLE progression, and trunk elevation. PT cuing pt to use bedrails with RUE, bed exit to pt L    Transfers Overall transfer level: Needs assistance Equipment used: Rolling walker (2 wheels) Transfers: Sit to/from Stand, Bed to chair/wheelchair/BSC Sit to Stand: Mod assist, +2 physical assistance, +2 safety/equipment, From elevated surface Stand pivot transfers: Mod assist, +2 physical assistance, +2 safety/equipment         General transfer comment: mod +2 for power up, rise, correcting RLE buckle and R lateral leaning. Step pivot to recliner towards pt's L, cues for weight shifting to facilitate stepping and blocking RLE when buckling. RLE extensor synergy during swing stepping    Ambulation/Gait                   Stairs             Wheelchair Mobility    Modified Rankin (Stroke Patients Only) Modified Rankin (Stroke Patients Only) Pre-Morbid Rankin Score: No symptoms Modified Rankin: Moderately severe disability     Balance Overall balance assessment: Needs assistance Sitting-balance support: Single extremity supported, Feet supported, No upper extremity supported Sitting balance-Leahy Scale: Fair Sitting balance - Comments: able to sit EOB without PT support today   Standing balance support: Bilateral upper extremity supported, During functional activity, Reliant on assistive device for balance Standing balance-Leahy Scale: Poor Standing balance comment: reliant on RW and PT/PT aide support                            Cognition Arousal/Alertness: Awake/alert Behavior During Therapy: WFL for tasks assessed/performed Overall Cognitive Status: Impaired/Different from  baseline Area of Impairment: Following commands, Problem solving                       Following Commands: Follows one step commands with  increased time     Problem Solving: Difficulty sequencing, Requires verbal cues, Requires tactile cues General Comments: Pt aware of deficits today, can state when she is leaning R or when RLE is buckling. Continued R inattention especially to UE        Exercises General Exercises - Lower Extremity Ankle Circles/Pumps: PROM, Right, 10 reps (x 10 second hold at end range DF) Long Arc Quad: AAROM, Right, 10 reps, Seated (with quick tapping quad for facilitation, cues for keeping chest leaned anteriorly to break up extensor synergy RLE)    General Comments        Pertinent Vitals/Pain Pain Assessment Pain Assessment: No/denies pain Pain Intervention(s): Monitored during session    Home Living                          Prior Function            PT Goals (current goals can now be found in the care plan section) Acute Rehab PT Goals Patient Stated Goal: get back to my independence PT Goal Formulation: With patient Time For Goal Achievement: 06/12/21 Potential to Achieve Goals: Good Progress towards PT goals: Progressing toward goals    Frequency    Min 4X/week      PT Plan Current plan remains appropriate    Co-evaluation              AM-PAC PT "6 Clicks" Mobility   Outcome Measure  Help needed turning from your back to your side while in a flat bed without using bedrails?: A Lot Help needed moving from lying on your back to sitting on the side of a flat bed without using bedrails?: A Lot Help needed moving to and from a bed to a chair (including a wheelchair)?: A Lot Help needed standing up from a chair using your arms (e.g., wheelchair or bedside chair)?: A Lot Help needed to walk in hospital room?: Total Help needed climbing 3-5 steps with a railing? : Total 6 Click Score: 10    End of Session Equipment Utilized During Treatment: Gait belt Activity Tolerance: Patient tolerated treatment well;Patient limited by fatigue Patient left: in  chair;with call bell/phone within reach;with chair alarm set;with family/visitor present Nurse Communication: Mobility status;Other (comment) (stedy +2 for back to bed) PT Visit Diagnosis: Other abnormalities of gait and mobility (R26.89);Hemiplegia and hemiparesis Hemiplegia - Right/Left: Right Hemiplegia - dominant/non-dominant: Dominant Hemiplegia - caused by: Nontraumatic intracerebral hemorrhage     Time: 1203-1233 PT Time Calculation (min) (ACUTE ONLY): 30 min  Charges:  $Therapeutic Activity: 8-22 mins $Neuromuscular Re-education: 8-22 mins                     Marye Round, PT DPT Acute Rehabilitation Services Pager [phone]  Office [phone]    Tyrone Apple E Christain [place] 05/30/2021, 2:16 PM

## 2021-05-30 NOTE — Progress Notes (Signed)
STROKE TEAM PROGRESS NOTE   INTERVAL HISTORY Patient with no complaints.  Did okay with PT yesterday.    EEG study was normal without any seizure activity. Vitals:   05/29/21 2028 05/29/21 2323 05/30/21 0413 05/30/21 0818  BP: (!) 163/77 (!) 149/76 (!) 166/82 (!) 144/75  Pulse: 71 69 66 72  Resp: 16 16 17 16   Temp: 99 F (37.2 C) 99.2 F (37.3 C) 98.8 F (37.1 C) 98.7 F (37.1 C)  TempSrc: Oral Oral Oral Oral  SpO2: 100% 94% 100% 100%  Weight:      Height:       CBC:  Recent Labs  Lab 05/27/21 1458  WBC 5.9  NEUTROABS 3.3  HGB 12.5  HCT 39.8  MCV 89.2  PLT 185   Basic Metabolic Panel:  Recent Labs  Lab 05/27/21 1458  NA 136  K 4.7  CL 106  CO2 25  GLUCOSE 93  BUN 13  CREATININE 0.74  CALCIUM 9.1   Lipid Panel: No results for input(s): CHOL, TRIG, HDL, CHOLHDL, VLDL, LDLCALC in the last 168 hours. HgbA1c: No results for input(s): HGBA1C in the last 168 hours. Urine Drug Screen:  Recent Labs  Lab 05/28/21 0713  LABOPIA NONE DETECTED  COCAINSCRNUR NONE DETECTED  LABBENZ NONE DETECTED  AMPHETMU NONE DETECTED  THCU NONE DETECTED  LABBARB NONE DETECTED    Alcohol Level No results for input(s): ETH in the last 168 hours.  IMAGING past 24 hours No results found.  PHYSICAL EXAM  Physical Exam  Constitutional: Appears well-developed and well-nourished pleasant elderly lady.  Psych: Affect appropriate to situation Cardiovascular: Normal rate and regular rhythm.  Respiratory: Effort normal, non-labored breathing  Neuro: Mental Status: Patient is awake, alert, oriented to person, place, month, year, and situation. Patient is able to give a clear and coherent history. No signs of aphasia or neglect Cranial Nerves: 2 through 12 grossly intact Motor: Right hemiparesis with RLE 2/5, RUE 4/5,  LLE 5/5, LUE 5/5 Fine motor control diminished in RUE, hand grasp in right hand 4/5 Sensory: Sensation is symmetric to light touch in the arms and legs.   Coordination: Past pointing with right FNF. No ataxia noted. HKS deferred due to weakness.   ASSESSMENT/PLAN [name] is a 71 y.o. female with history of HTN, arthritis, diverticulitis, heart murmur, hep B, and migraines presenting with sudden onset of right leg weakness. She ran out of her medications and had her last dose of amlodipine on Monday. Her prescription ran out and there was some sort of misunderstanding in getting it refilled so she has been taking her Norvasc sporadically instead of daily until she could get it refilled.  She drove herself to church this morning and in the afternoon noted that she could not move her right leg.  She was brought in as an acute code stroke to [facility] hospital where she was found to have a left high frontal ICH.  She did state that she had a colonoscopy Monday and she does have a heart murmur. Last known well 1300 hrs. Head CT showed an IPH in the left parasagittal frontoparietal lobe. At 2230 on 1/14 RN noted right foot twitching every few minutes lasting over 30 minutes, neurohospitalist notified. Concern for possible seizure, she was given 1mg  of ativan, 2g Keppra load. EEG and Echo pending. MRI with contrast performed to rule out mets source.  MRI reveals numerous microhemorrhages, and IPH was likely caused by hypertension.  Will repeat MRI in 6  months, no angiogram needed. PT/OT eval pending.   Stroke:   IPH in the left parasagittal frontoparietal lobes likely secondary uncontrolled hypertensive source vs metastatic source doubt endocarditis given history of colonoscopy earlier last week. Code Stroke acute IPH in the parasagittal left frontoparietal lobes with mild edema.  No substantial mass-effect.  No IVH. Repeat CT- unchanged size of Lt frontal lobe IPH CTA head & neck No hemodynamically significant stenosis. CT venogram negative for dural venous sinus thrombus MRI  No mass or infarct underlying left frontal IPH,  innumerable remote lobar microhemorrhages and white matter gliosis compatible with amyloid angiopathy 2D Echo EF 60-65%, mild LVH, grade 1 diastolic dysfunction, normal atrial septum EEG Normal awake study with no epileptiform discharges LDL 101 HgbA1c 5. 4  neurological monitoring and strict blood pressure control with systolic below 160 VTE prophylaxis - SCDs aspirin 81 mg daily prior to admission, now on No antithrombotic r/t IPH Therapy recommendations: Short-term rehab Disposition: Short-term rehab pending  Hypertension Home meds:  Norvasc 2.5mg   Stable BP systolic less than 160  Hyperlipidemia Home meds:  Rosuvastatin 5 mg daily, will resume at discharge  LDL 101, goal < 70 Continue statin at discharge  Other Stroke Risk Factors ETOH use, advised to drink no more than 1 drink(s) a day Obesity, Body mass index is 42.05 kg/m., BMI >/= 30 associated with increased stroke risk, recommend weight loss, diet and exercise as appropriate  Hx stroke/TIA  Other Active Problems Possible seizure activity Loaded with 2g Keppra EEG reveals no seizure activity Patient medically stable for discharge Case management working on disposition options with rehab.  Blood pressure well controlled.  Patient ID: Terri Werner, female   DOB: 1950-11-10, 71 y.o.   MRN: [id number]

## 2021-05-30 NOTE — Plan of Care (Signed)
°  Problem: Intracerebral Hemorrhage Tissue Perfusion: Goal: Complications of Intracerebral Hemorrhage will be minimized Outcome: Progressing   Problem: Education: Goal: Knowledge of disease or condition will improve Outcome: Progressing Goal: Knowledge of secondary prevention will improve (SELECT ALL) Outcome: Progressing Goal: Knowledge of patient specific risk factors will improve (INDIVIDUALIZE FOR PATIENT) Outcome: Progressing Goal: Individualized Educational Video(s) Outcome: Progressing

## 2021-05-30 NOTE — PMR Pre-admission (Signed)
PMR Admission Coordinator Pre-Admission Assessment  Patient: Terri Werner is an 71 y.o., female MRN: [id number] DOB: 11/29/1950 Height: 5' 2.01" (157.5 cm) Weight: 104.3 kg  Insurance Information HMO: yes    PPO:      PCP:      IPA:      80/20:      OTHER:  PRIMARY: Blue Medicare      Policy#: [id number]      Subscriber: pt CM Name: Carollee Herter      Phone#: [phone]     Fax#: [phone] Pre-Cert#: TBD approved for 7 days     Employer:  Benefits:  Phone #: [phone]     Name: 1/13 Eff. Date: 05/14/2021     Deduct: none      Out of Pocket Max: $3950      Life Max: none CIR: $355 co pay per day days 1 until 6      SNF: no copay days 1 until 20; $196 c copay per days 21 until 60; no  copay days 61 until 100 Outpatient: $10 per visit     Co-Pay: visits per medical neccesity Home Health: 100%      Co-Pay: visits epr medical neccesity DME: 80%     Co-Pay: 20% Providers: in network  SECONDARY: none        Financial Counselor:       Phone#:   [employer] for patients in Inpatient Rehabilitation Facilities with attached Privacy Act Statement-Health Care Records was provided and verbally reviewed with: Patient  Emergency Contact Information Contact Information     Name Relation Home Work [place] Sister [phone]     Manus Rudd Daughter [phone]  [phone]   [facility] [address] Niece   [phone]      Current Medical History  Patient Admitting Diagnosis: ICH  History of Present Illness: 71 year old right-handed female with history of hepatitis B, hyperlipidemia, hypertension but ran out of her medications and did not seek a refill.   Presented 05/27/2021 with sudden onset of right leg weakness and mild headache.  Cranial CT scan showed acute parenchymal hemorrhage in the parasagittal left frontal parietal lobes with mild edema.  No substantial mass-effect.  CT angiogram head and neck no abnormal vascularity in the region of  hemorrhage no hemodynamically significant stenosis.  MRI follow-up showed no mass or infarct seen underlying the left posterior frontal hematoma.  Admission chemistries were unremarkable except glucose 103, urine drug screen negative, urine culture greater 100,000 gram-negative rods.  Patient was placed on Keflex x5 days for UTI.  EEG showed no seizure activity.  Echocardiogram with ejection fraction of 60 to 65% no wall motion abnormalities grade 1 diastolic dysfunction.  Neurology follow-up IPH likely caused by hypertension monitored closely and she was initially maintained on Cleviprex.  Tolerating a regular consistency diet.     Complete NIHSS TOTAL: 0  Patient's medical record from [facility] has been reviewed by the rehabilitation admission coordinator and physician.  Past Medical History  Past Medical History:  Diagnosis Date   Arthritis    pt reports in back & knees   Colon polyps 2010   At [facility]   Diverticulitis 2016   Genital warts    GERD (gastroesophageal reflux disease)    Heart murmur    Hepatitis B    History of blood transfusion    during each major surgery per pt   History of chicken pox    History of hiatal hernia    History  of torn meniscus of right knee    HTN (hypertension)    Hx of migraine headaches    Hypertension    Hypopotassemia    Has the patient had major surgery during 100 days prior to admission? No  Family History   family history includes Alzheimer's disease in her mother; Birth defects in her maternal grandmother; Breast cancer (age of onset: 87) in her sister; Colon cancer in her maternal grandmother; Congestive Heart Failure in her father; Dementia in her mother, paternal grandfather, and paternal grandmother; Hyperlipidemia in her father and maternal grandfather; Hypertension in her maternal grandfather and mother; Thyroid disease in her sister.  Current Medications  Current Facility-Administered Medications:    acetaminophen (TYLENOL)  tablet 650 mg, 650 mg, Oral, Q4H PRN, 650 mg at 05/31/21 0624 **OR** acetaminophen (TYLENOL) 160 MG/5ML solution 650 mg, 650 mg, Per Tube, Q4H PRN **OR** acetaminophen (TYLENOL) suppository 650 mg, 650 mg, Rectal, Q4H PRN, Caryl Pina, MD   cephALEXin (KEFLEX) capsule 500 mg, 500 mg, Oral, Q6H, de Saintclair Halsted, Cortney E, NP, 500 mg at 05/31/21 0620   Chlorhexidine Gluconate Cloth 2 % PADS 6 each, 6 each, Topical, Q0600, Caryl Pina, MD, 6 each at 05/31/21 3086   hydrALAZINE (APRESOLINE) injection 10 mg, 10 mg, Intravenous, Q4H PRN, Milon Dikes, MD, 10 mg at 05/29/21 0831   labetalol (NORMODYNE) injection 10 mg, 10 mg, Intravenous, Q2H PRN, Milon Dikes, MD, 10 mg at 05/29/21 1827   pantoprazole (PROTONIX) EC tablet 40 mg, 40 mg, Oral, QHS, Pearlean Brownie, Pramod S, MD, 40 mg at 05/30/21 2307   senna-docusate (Senokot-S) tablet 1 tablet, 1 tablet, Oral, BID, Caryl Pina, MD, 1 tablet at 05/31/21 5784  Patients Current Diet:  Diet Order             Diet regular Room service appropriate? Yes; Fluid consistency: Thin  Diet effective now                  Precautions / Restrictions Precautions Precautions: Fall Precaution Comments: R hemiparesis, extensor synergy dynamically RLE Restrictions Weight Bearing Restrictions: No   Has the patient had 2 or more falls or a fall with injury in the past year? 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 Independent  Indoor Mobility: Did the patient need assistance with walking from room to room (with or without device)? Independent  Stairs: Did the patient need assistance with internal or external stairs (with or without device)? Independent  Functional Cognition: Did the patient need help planning regular tasks such as shopping or remembering to take medications? Independent  Patient  Information Are you of Hispanic, Latino/a,or Spanish origin?: A. No, not of Hispanic, Latino/a, or Spanish origin What is your race?: B. Black or African American Do you need or want an interpreter to communicate with a doctor or health care staff?: 0. No  Patient's Response To:  Health Literacy and Transportation Is the patient able to respond to health literacy and transportation needs?: Yes Health Literacy - How often do you need to have someone help you when you read instructions, pamphlets, or other written material from your doctor or pharmacy?: Never In the past 12 months, has lack of transportation kept you from medical appointments or from getting medications?: No In the past 12 months, has lack of transportation kept you from meetings, work, or from getting things needed for  daily living?: No  Home Assistive Devices / Equipment Home Assistive Devices/Equipment: None Home Equipment: Shower seat, Agricultural consultant (2 wheels)  Prior Device Use: Indicate devices/aids used by the patient prior to current illness, exacerbation or injury? None of the above  Current Functional Level Cognition  Arousal/Alertness: Awake/alert Overall Cognitive Status: Impaired/Different from baseline Orientation Level: Oriented X4 Following Commands: Follows one step commands with increased time General Comments: Pt aware of deficits today, can state when she is leaning R or when RLE is buckling. Continued R inattention especially to UE Attention: Focused, Sustained, Alternating Focused Attention: Appears intact Sustained Attention: Appears intact Alternating Attention: Appears intact Memory: Appears intact Awareness: Appears intact Problem Solving: Impaired Problem Solving Impairment: Functional complex, Verbal complex Safety/Judgment: Appears intact    Extremity Assessment (includes Sensation/Coordination)  Upper Extremity Assessment: Defer to OT evaluation RUE Deficits / Details: Decreased  strength. Able to perform ROM and finger opposition with increased time. decreased sensation. poor proprioception and attention to RUE RUE Sensation: decreased proprioception RUE Coordination: decreased fine motor, decreased gross motor  Lower Extremity Assessment: RLE deficits/detail RLE Deficits / Details: 2/5 knee extension/extension, 1/5 hip flexion, 1/5 DF/PF. hypertonicity in hip adduction RLE Sensation: decreased light touch    ADLs  Overall ADL's : Needs assistance/impaired Eating/Feeding: Minimal assistance, Sitting Grooming: Moderate assistance, Sitting Upper Body Bathing: Moderate assistance, Sitting Lower Body Bathing: Total assistance, Sit to/from stand, +2 for physical assistance, +2 for safety/equipment Upper Body Dressing : Maximal assistance, Sitting Lower Body Dressing: Total assistance, Sit to/from stand, +2 for physical assistance, +2 for safety/equipment Toilet Transfer: Maximal assistance, +2 for physical assistance, +2 for safety/equipment, Stand-pivot (simulated to recliner) General ADL Comments: Pt very motivated. Poor attention to R and lateral leaning.    Mobility  Overal bed mobility: Needs Assistance Bed Mobility: Supine to Sit Supine to sit: Mod assist General bed mobility comments: assist for sequencing, RLE progression, and trunk elevation. PT cuing pt to use bedrails with RUE, bed exit to pt L    Transfers  Overall transfer level: Needs assistance Equipment used: Rolling walker (2 wheels) Transfers: Sit to/from Stand, Bed to chair/wheelchair/BSC Sit to Stand: Mod assist, +2 physical assistance, +2 safety/equipment, From elevated surface Bed to/from chair/wheelchair/BSC transfer type:: Stand pivot Stand pivot transfers: Mod assist, +2 physical assistance, +2 safety/equipment General transfer comment: mod +2 for power up, rise, correcting RLE buckle and R lateral leaning. Step pivot to recliner towards pt's L, cues for weight shifting to facilitate  stepping and blocking RLE when buckling. RLE extensor synergy during swing stepping    Ambulation / Gait / Stairs / Engineer, drilling / Balance Dynamic Sitting Balance Sitting balance - Comments: able to sit EOB without PT support today Balance Overall balance assessment: Needs assistance Sitting-balance support: Single extremity supported, Feet supported, No upper extremity supported Sitting balance-Leahy Scale: Fair Sitting balance - Comments: able to sit EOB without PT support today Standing balance support: Bilateral upper extremity supported, During functional activity, Reliant on assistive device for balance Standing balance-Leahy Scale: Poor Standing balance comment: reliant on RW and PT/PT aide support    Special needs/care consideration    Previous Home Environment  Living Arrangements: Alone  Lives With: Alone Available Help at Discharge: Family, Available 24 hours/day (sister, Luster Landsberg, niece and daughter to asisst) Type of Home: House Home Layout: One level Home Access: Ramped entrance Bathroom Shower/Tub: Health visitor: Handicapped height Bathroom Accessibility: Yes How Accessible: Accessible via walker Home Care  Services: No  Discharge Living Setting Plans for Discharge Living Setting: Patient's home (sister, niece and local daughter to provide 24/7 assist) Type of Home at Discharge: House Discharge Home Layout: One level Discharge Home Access: Ramped entrance Discharge Bathroom Shower/Tub: Walk-in shower Discharge Bathroom Toilet: Handicapped height Discharge Bathroom Accessibility: Yes How Accessible: Accessible via walker Does the patient have any problems obtaining your medications?: No  Social/Family/Support Systems Patient Roles: Parent Contact Information: sister, Luster Landsberg and daughter, Massie Bougie Anticipated Caregiver: sister, niece and local daughter Anticipated Caregiver's Contact Information: see  contacts Ability/Limitations of Caregiver: sister and niece to arrnage work schedules and they work from home Caregiver Availability: 24/7 Discharge Plan Discussed with Primary Caregiver: Yes Is Caregiver In Agreement with Plan?: Yes Does Caregiver/Family have Issues with Lodging/Transportation while Pt is in Rehab?: No   She has long term care policy with [employer] that daughter is calling to begin ability to use for caregiver supports at home.  Goals Patient/Family Goal for Rehab: min assist with PT and OT, Mod I with SLP Expected length of stay: ELOS 14 to 20 days Additional Information: Patient has longterm insurance with [employer] Pt/Family Agrees to Admission and willing to participate: Yes Program Orientation Provided & Reviewed with Pt/Caregiver Including Roles  & Responsibilities: Yes  Decrease burden of Care through IP rehab admission: n/a  Possible need for SNF placement upon discharge: not anticipated  Patient Condition: I have reviewed medical records from [facility], spoken with CM, and patient and family member. I met with patient at the bedside for inpatient rehabilitation assessment.  Patient will benefit from ongoing PT, OT, and SLP, can actively participate in 3 hours of therapy a day 5 days of the week, and can make measurable gains during the admission.  Patient will also benefit from the coordinated team approach during an Inpatient Acute Rehabilitation admission.  The patient will receive intensive therapy as well as Rehabilitation physician, nursing, social worker, and care management interventions.  Due to bladder management, bowel management, safety, skin/wound care, disease management, medication administration, pain management, and patient education the patient requires 24 hour a day rehabilitation nursing.  The patient is currently mod to max assist overall with mobility and basic ADLs.  Discharge setting and therapy post discharge at home with home health is  anticipated.  Patient has agreed to participate in the Acute Inpatient Rehabilitation Program and will admit today.  Preadmission Screen Completed By:  Clois Dupes, 05/31/2021 9:37 AM ______________________________________________________________________   Discussed status with Dr. Carlis Abbott on 05/31/2021 at [phone] and received approval for admission today.  Admission Coordinator:  Clois Dupes, RN, time 2440 Date  05/31/2021   Assessment/Plan: Diagnosis: ICH Does the need for close, 24 hr/day Medical supervision in concert with the patient's rehab needs make it unreasonable for this patient to be served in a less intensive setting? Yes Co-Morbidities requiring supervision/potential complications: morbid obesity, HTN, HLD, carotid artery disorder, diverticulosis of colon Due to bladder management, bowel management, safety, skin/wound care, disease management, medication administration, pain management, and patient education, does the patient require 24 hr/day rehab nursing? Yes Does the patient require coordinated care of a physician, rehab nurse, PT, OT to address physical and functional deficits in the context of the above medical diagnosis(es)? Yes Addressing deficits in the following areas: balance, endurance, locomotion, strength, transferring, bowel/bladder control, bathing, dressing, feeding, grooming, toileting, and psychosocial support Can the patient actively participate in an intensive therapy program of at least 3 hrs of therapy 5 days a week?  Yes The potential for patient to make measurable gains while on inpatient rehab is excellent Anticipated functional outcomes upon discharge from inpatient rehab: min assist PT, min assist OT, independent SLP Estimated rehab length of stay to reach the above functional goals is: 12-16 days Anticipated discharge destination: Home 10. Overall Rehab/Functional Prognosis: good   MD Signature: Sula Soda, MD

## 2021-05-30 NOTE — Progress Notes (Signed)
Inpatient Rehabilitation Admissions Coordinator   I spoke with her sister, Renee,by phone. We reviewed goals and expectations of a possible Cir admit . She and family will provide 24/7 caregiver supports after Cir. I will begin Auth with [facility] Medicare for a possible admit.  Danne Baxter, RN, MSN Rehab Admissions Coordinator [phone] 05/30/2021 9:00 AM

## 2021-05-30 NOTE — H&P (Incomplete)
Physical Medicine and Rehabilitation Admission H&P     HPI: Terri Werner. [name] is a 71 year old right-handed female with history of hepatitis B, hyperlipidemia, hypertension but ran out of her medications and did not seek a refill.  Per chart review patient lives alone.  1 level home ramped entrance.  Recently retired in November.  She has a daughter and niece in the area.  Presented 05/27/2021 with sudden onset of right leg weakness and mild headache.  Cranial CT scan showed acute parenchymal hemorrhage in the parasagittal left frontal parietal lobes with mild edema.  No substantial mass-effect.  CT angiogram head and neck no abnormal vascularity in the region of hemorrhage no hemodynamically significant stenosis.  MRI follow-up showed no mass or infarct seen underlying the left posterior frontal hematoma.  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  EEG showed no seizure activity.  Echocardiogram with ejection fraction of 60 to 65% no wall motion abnormalities grade 1 diastolic dysfunction.  Neurology follow-up IPH likely caused by hypertension monitored closely and she was initially maintained on Cleviprex.  Tolerating a regular consistency diet.  Therapy evaluations completed due to patient's right side weakness decreased functional mobility was admitted for a comprehensive rehab program.  Review of Systems  Constitutional:  Negative for chills and fever.  HENT:  Negative for hearing loss.   Eyes:  Negative for blurred vision and double vision.  Respiratory:  Negative for cough and shortness of breath.   Cardiovascular:  Negative for chest pain, palpitations and leg swelling.  Gastrointestinal:  Positive for constipation. Negative for heartburn, nausea and vomiting.       GERD  Genitourinary:  Negative for dysuria, flank pain and  hematuria.  Musculoskeletal:  Positive for myalgias.  Skin:  Negative for rash.  Neurological:  Positive for weakness and headaches.  All other systems reviewed and are negative. Past Medical History:  Diagnosis Date   Arthritis    pt reports in back & knees   Colon polyps 2010   At [facility]   Diverticulitis 2016   Genital warts    GERD (gastroesophageal reflux disease)    Heart murmur    Hepatitis B    History of blood transfusion    during each major surgery per pt   History of chicken pox    History of hiatal hernia    History of torn meniscus of right knee    HTN (hypertension)    Hx of migraine headaches    Hypertension    Hypopotassemia    Past Surgical History:  Procedure Laterality Date   ABDOMINAL HYSTERECTOMY  2006   [place]   COLONOSCOPY WITH PROPOFOL N/A 11/01/2014   Procedure: COLONOSCOPY WITH PROPOFOL;  Surgeon: Manya Silvas, MD;  Location: [place];  Service: Endoscopy;  Laterality: N/A;   COLONOSCOPY WITH PROPOFOL N/A 01/05/2020   Procedure: COLONOSCOPY WITH PROPOFOL;  Surgeon: [name], MD;  Location: [facility];  Service: Endoscopy;  Laterality: N/A;   [place]   HERNIA REPAIR  4010   Umbilical   KNEE ARTHROSCOPY Left 2006   torn meniscus   TONSILLECTOMY  1971   TOTAL ABDOMINAL HYSTERECTOMY W/ BILATERAL SALPINGOOPHORECTOMY  2006   VESICOVAGINAL FISTULA CLOSURE W/ TAH     Family History  Problem Relation Age of Onset   Breast cancer  Sister 56   Hypertension Mother    Alzheimer's disease Mother    Dementia Mother    Hypertension Maternal Grandfather    Hyperlipidemia Maternal Grandfather    Birth defects Maternal Grandmother        Bladder   Colon cancer Maternal Grandmother    Congestive Heart Failure Father    Hyperlipidemia Father    Dementia Paternal Grandmother    Dementia Paternal Grandfather    Thyroid disease Sister    Social History:  reports that she has  never smoked. She has never used smokeless tobacco. She reports current alcohol use. She reports that she does not use drugs. Allergies:  Allergies  Allergen Reactions   Shrimp [Shellfish Allergy] Swelling    Lips will burn    Latex Itching    Burning   Medications Prior to Admission  Medication Sig Dispense Refill   acetaminophen (TYLENOL) 500 MG tablet Take 1,000 mg by mouth every 6 (six) hours as needed for headache (pain).     Cholecalciferol (VITAMIN D) 2000 UNITS CAPS Take 2,000 Units by mouth daily.     rosuvastatin (CRESTOR) 5 MG tablet Take 5 mg by mouth daily.     amLODipine (NORVASC) 2.5 MG tablet Take 1 tablet (2.5 mg total) by mouth daily. (Patient not taking: Reported on 05/27/2021) 90 tablet 1   aspirin EC 81 MG tablet Take 81 mg by mouth daily. Swallow whole. (Patient not taking: Reported on 05/27/2021)     rosuvastatin (CRESTOR) 10 MG tablet Take 1 tablet (10 mg total) by mouth daily. TAKE 1 TABLET(5 MG) BY MOUTH DAILY (Patient not taking: Reported on 05/27/2021) 90 tablet 1    Drug Regimen Review Drug regimen was reviewed and remains appropriate with no significant issues identified  Home: Home Living Family/patient expects to be discharged to:: Private residence Living Arrangements: Alone Available Help at Discharge: Family, Available 24 hours/day (sister, [name], niece and daughter to asisst) Type of Home: House Home Access: Ramped entrance [place]: One level Bathroom Shower/Tub: Multimedia programmer: Handicapped height Bathroom Accessibility: Yes Home Equipment: Civil engineer, contracting, Conservation officer, nature (2 wheels)  Lives With: Alone   Functional History: Prior Function Prior Level of Function : Independent/Modified Independent, Driving Mobility Comments: 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 Performing ADLs and IADLs.  Functional Status:  Mobility: Bed Mobility Overal bed mobility: Needs Assistance Bed Mobility: Supine to Sit Supine to  sit: Mod assist General bed mobility comments: assist for sequencing, RLE progression, and trunk elevation. PT cuing pt to use bedrails with RUE, bed exit to pt L Transfers Overall transfer level: Needs assistance Equipment used: Rolling walker (2 wheels) Transfers: Sit to/from Stand, Bed to chair/wheelchair/BSC Sit to Stand: Mod assist, +2 physical assistance, +2 safety/equipment, From elevated surface Bed to/from chair/wheelchair/BSC transfer type:: Stand pivot Stand pivot transfers: Mod assist, +2 physical assistance, +2 safety/equipment General transfer comment: mod +2 for power up, rise, correcting RLE buckle and R lateral leaning. Step pivot to recliner towards pt's L, cues for weight shifting to facilitate stepping and blocking RLE when buckling. RLE extensor synergy during swing stepping      ADL: ADL Overall ADL's : Needs assistance/impaired Eating/Feeding: Minimal assistance, Sitting Grooming: Moderate assistance, Sitting Upper Body Bathing: Moderate assistance, Sitting Lower Body Bathing: Total assistance, Sit to/from stand, +2 for physical assistance, +2 for safety/equipment Upper Body Dressing : Maximal assistance, Sitting Lower Body Dressing: Total assistance, Sit to/from stand, +2 for physical assistance, +2 for safety/equipment Toilet Transfer: Maximal assistance, +2  for physical assistance, +2 for safety/equipment, Stand-pivot (simulated to recliner) General ADL Comments: Pt very motivated. Poor attention to R and lateral leaning.  Cognition: Cognition Overall Cognitive Status: Impaired/Different from baseline Arousal/Alertness: Awake/alert Orientation Level: Oriented X4 Attention: Focused, Sustained, Alternating Focused Attention: Appears intact Sustained Attention: Appears intact Alternating Attention: Appears intact Memory: Appears intact Immediate Memory Recall: Sheila Oats, Bed Memory Recall Sock: Without Cue Memory Recall Blue: Without Cue Memory Recall  Bed: Without Cue Awareness: Appears intact Problem Solving: Impaired Problem Solving Impairment: Functional complex, Verbal complex Safety/Judgment: Appears intact Cognition Arousal/Alertness: Awake/alert Behavior During Therapy: WFL for tasks assessed/performed Overall Cognitive Status: Impaired/Different from baseline Area of Impairment: Following commands, Problem solving Following Commands: Follows one step commands with increased time Awareness: Emergent Problem Solving: Difficulty sequencing, Requires verbal cues, Requires tactile cues General Comments: Pt aware of deficits today, can state when she is leaning R or when RLE is buckling. Continued R inattention especially to UE  Physical Exam: Blood pressure 126/68, pulse 77, temperature (!) 97.4 F (36.3 C), temperature source Axillary, resp. rate 17, height 5' 2.01" (1.575 m), weight 104.3 kg, SpO2 99 %. Physical Exam Neurological:     Comments: Patient is alert.  Makes eye contact with examiner.  Follows simple commands.  Provides name and age but did have some difficulty with recall of hospital course.    Results for orders placed or performed during the hospital encounter of 05/27/21 (from the past 48 hour(s))  Urine Culture     Status: Abnormal (Preliminary result)   Collection Time: 05/29/21  1:47 PM   Specimen: Urine, Clean Catch  Result Value Ref Range   Specimen Description URINE, CLEAN CATCH    Special Requests NONE    Culture (A)     >=100,000 COLONIES/mL ESCHERICHIA COLI 40,000 COLONIES/mL KLEBSIELLA PNEUMONIAE SUSCEPTIBILITIES TO FOLLOW Performed at [facility] [address] [place], [place] [address]    Report Status PENDING    MR BRAIN W WO CONTRAST  Result Date: 05/29/2021 CLINICAL DATA:  Neuro deficit with stroke suspected EXAM: MRI HEAD WITHOUT AND WITH CONTRAST TECHNIQUE: Multiplanar, multiecho pulse sequences of the brain and surrounding structures were obtained without and with intravenous  contrast. CONTRAST:  52mL GADAVIST GADOBUTROL 1 MMOL/ML IV SOLN COMPARISON:  Head CT from yesterday FINDINGS: Brain: Acute parenchymal hemorrhage in the parasagittal posterior left frontal lobe as previously seen. No suspected enlargement. Methemoglobin has formed with T1 shortening. There is an expected rim of edema. No abnormal adjacent enhancement. Innumerable chronic microhemorrhages in the peripheral brain. Brain volume is normal. Chronic small vessel ischemic type change in the hemispheric white matter. Partially empty sella, nonspecific in isolation. Vascular: Normal flow voids and vascular enhancements Skull and upper cervical spine: Normal marrow signal Sinuses/Orbits: Negative IMPRESSION: No mass or infarct seen underlying the left posterior frontal hematoma. There are however signs of innumerable remote lobar microhemorrhages and white matter gliosis, compatible with amyloid angiopathy or other primary vascular disease. Electronically Signed   By: Jorje Guild M.D.   On: 05/29/2021 10:37       Medical Problem List and Plan: 1. Functional deficits secondary to IPH in the left parasagittal frontal parietal lobes likely secondary to uncontrolled hypertension  -patient may *** shower  -ELOS/Goals: *** 2.  Antithrombotics: -DVT/anticoagulation:  Mechanical:  Antiembolism stockings, knee (TED hose) Bilateral lower extremities  -antiplatelet therapy: N/A 3. Pain Management: Tylenol as needed 4. Mood: Provide emotional support  -antipsychotic agents: N/A 5. Neuropsych: This patient is capable of  making decisions on her own behalf. 6. Skin/Wound Care: Routine skin checks 7. Fluids/Electrolytes/Nutrition: Routine in and outs with follow-up chemistries 8.  Hypertension.  Patient initially on Cleviprex.  Monitor with increased mobility 9.  E. coli/Klebsiella completing course of Bactrim x3 days 10.  GERD.  Protonix 11.  Hyperlipidemia.  Rosuvastatin 5 mg daily resume at discharge. 12.   Obesity.  BMI 42.05.  Dietary follow-up   Cathlyn Parsons, PA-C 05/31/2021

## 2021-05-30 NOTE — Progress Notes (Addendum)
STROKE TEAM PROGRESS NOTE   INTERVAL HISTORY Patient is resting comfortably in bed with no family at the bedside.  She has been transferred out of the ICU.  She remains hemodynamically stable, and her neurological exam is stable.  She will hopefully be discharged to CIR soon.  She continues to have right lower extremity weakness which is unchanged. Vitals:   05/29/21 2028 05/29/21 2323 05/30/21 0413 05/30/21 0818  BP: (!) 163/77 (!) 149/76 (!) 166/82 (!) 144/75  Pulse: 71 69 66 72  Resp: 16 16 17 16   Temp: 99 F (37.2 C) 99.2 F (37.3 C) 98.8 F (37.1 C) 98.7 F (37.1 C)  TempSrc: Oral Oral Oral Oral  SpO2: 100% 94% 100% 100%  Weight:      Height:       CBC:  Recent Labs  Lab 05/27/21 1458  WBC 5.9  NEUTROABS 3.3  HGB 12.5  HCT 39.8  MCV 89.2  PLT 244    Basic Metabolic Panel:  Recent Labs  Lab 05/27/21 1458  NA 136  K 4.7  CL 106  CO2 25  GLUCOSE 93  BUN 13  CREATININE 0.74  CALCIUM 9.1    Lipid Panel: No results for input(s): CHOL, TRIG, HDL, CHOLHDL, VLDL, LDLCALC in the last 168 hours. HgbA1c: No results for input(s): HGBA1C in the last 168 hours. Urine Drug Screen:  Recent Labs  Lab 05/28/21 0713  LABOPIA NONE DETECTED  COCAINSCRNUR NONE DETECTED  LABBENZ NONE DETECTED  AMPHETMU NONE DETECTED  THCU NONE DETECTED  LABBARB NONE DETECTED     Alcohol Level No results for input(s): ETH in the last 168 hours.  IMAGING past 24 hours No results found.  PHYSICAL EXAM  Physical Exam  Constitutional: Appears well-developed and well-nourished pleasant elderly lady.  Psych: Affect appropriate to situation Respiratory: Effort normal, non-labored breathing  Neuro: Mental Status: Patient is awake, alert, oriented to person, place, month, year, and situation. Patient is able to give a clear and coherent history. No signs of aphasia or neglect Cranial Nerves: II: Pupils are equal, round, and reactive to light.   III,IV, VI: EOMI without ptosis or  diploplia.  V: Facial sensation is symmetric to light touch VII: Smile is symmetrical VIII: Hearing is intact to voice X: Phonation is normal XII: Tongue protrudes midline without atrophy or fasciculations.  Motor: Right hemiparesis with RLE 3/5, RUE 4/5,  LLE 5/5, LUE 5/5 Fine motor control diminished in RUE, hand grasp in right hand 4/5 Sensory: Sensation is symmetric to light touch in the arms and legs.  Coordination: Past pointing with right FNF. No ataxia noted. HKS deferred due to weakness.   ASSESSMENT/PLAN [name] is a 71 y.o. female with history of HTN, arthritis, diverticulitis, heart murmur, hep B, and migraines presenting with sudden onset of right leg weakness. She ran out of her medications and had her last dose of amlodipine on Monday. Her prescription ran out and there was some sort of misunderstanding in getting it refilled so she has been taking her Norvasc sporadically instead of daily until she could get it refilled.  She drove herself to church this morning and in the afternoon noted that she could not move her right leg.  She was brought in as an acute code stroke to [facility] hospital where she was found to have a left high frontal ICH.  She did state that she had a colonoscopy Monday and she does have a heart murmur. Last known well 1300 hrs.  Head CT showed an IPH in the left parasagittal frontoparietal lobe. At 2230 on 1/14 RN noted right foot twitching every few minutes lasting over 30 minutes, neurohospitalist notified. Concern for possible seizure, she was given 1mg  of ativan, 2g Keppra load. EEG and Echo pending. MRI with contrast performed to rule out mets source.  MRI reveals numerous microhemorrhages, and IPH was likely caused by hypertension.  Will repeat MRI in 6 months, no angiogram needed. Patient to be discharged to CIR soon.  Stroke team will sign off but will remain available for any questions or concerns.  Stroke:   IPH in the  left parasagittal frontoparietal lobes likely secondary uncontrolled hypertensive source vs metastatic source doubt endocarditis given history of colonoscopy earlier last week. Code Stroke acute IPH in the parasagittal left frontoparietal lobes with mild edema.  No substantial mass-effect.  No IVH. Repeat CT- unchanged size of Lt frontal lobe IPH CTA head & neck No hemodynamically significant stenosis. CT venogram negative for dural venous sinus thrombus MRI  No mass or infarct underlying left frontal IPH, innumerable remote lobar microhemorrhages and white matter gliosis compatible with amyloid angiopathy 2D Echo EF 60-65%, mild LVH, grade 1 diastolic dysfunction, normal atrial septum EEG Normal awake study with no epileptiform discharges LDL 101 HgbA1c 5. 4  neurological monitoring and strict blood pressure control with systolic below 638 VTE prophylaxis - SCDs aspirin 81 mg daily prior to admission, now on No antithrombotic r/t IPH Therapy recommendations:  CIR Disposition:  Pending  Hypertension Home meds:  Norvasc 2.5mg   Stable BP systolic less than 937  Hyperlipidemia Home meds:  Rosuvastatin 5 mg daily, will resume at discharge  LDL 101, goal < 70 Continue statin at discharge  Other Stroke Risk Factors ETOH use, advised to drink no more than 1 drink(s) a day Obesity, Body mass index is 42.05 kg/m., BMI >/= 30 associated with increased stroke risk, recommend weight loss, diet and exercise as appropriate  Hx stroke/TIA  Other Active Problems Possible seizure activity Loaded with 2g Keppra EEG reveals no seizure activity   Hospital day # 3  Patient seen and examined by NP/APP with MD. MD to update note as needed.   [place] , MSN, AGACNP-BC [facility] See Amion for schedule and pager information 05/30/2021 11:40 AM I have personally obtained history,examined this patient, reviewed notes, independently viewed imaging studies, participated in  medical decision making and plan of care.ROS completed by me personally and pertinent positives fully documented  I have made any additions or clarifications directly to the above note. Agree with note above.  Patient remains neurologically stable with still have some persistent right leg weakness.  Continue ongoing physical occupational therapy and transfer to inpatient rehab when bed available.  Continue strict control of hypertension with blood pressure goal below 342 systolic.  Follow-up with outpatient stroke clinic in 2 months.  Greater than 50% time during this 35-minute visit were spent in counseling and coordination of care about intracerebral hemorrhage and discussion prevention and treatment and answering questions and discussion with care team.  Stroke team will sign off.  Kindly call for questions Antony Contras, MD Medical Director [place] Pager: [phone] 05/30/2021 12:47 PM    To contact Stroke Continuity provider, please refer to [email or web address] After hours, contact General Neurology

## 2021-05-30 NOTE — Care Management Important Message (Signed)
Important Message  Patient Details  Name: Terri Werner MRN: [id number] Date of Birth: 22-Mar-1951   Medicare Important Message Given:  Yes     Zea Kostka 05/30/2021, 1:01 PM

## 2021-05-31 ENCOUNTER — Other Ambulatory Visit: Payer: Self-pay

## 2021-05-31 ENCOUNTER — Inpatient Hospital Stay (HOSPITAL_COMMUNITY)
Admission: RE | Admit: 2021-05-31 | Discharge: 2021-06-27 | DRG: 057 | Disposition: A | Discharge status: Home Health | Payer: Medicare Other | Source: Intra-hospital | Attending: Physical Medicine & Rehabilitation | Admitting: Physical Medicine & Rehabilitation

## 2021-05-31 ENCOUNTER — Encounter (HOSPITAL_COMMUNITY): Payer: Self-pay | Admitting: Physical Medicine & Rehabilitation

## 2021-05-31 DIAGNOSIS — I779 Disorder of arteries and arterioles, unspecified: Secondary | ICD-10-CM

## 2021-05-31 DIAGNOSIS — R0789 Other chest pain: Secondary | ICD-10-CM | POA: Diagnosis not present

## 2021-05-31 DIAGNOSIS — M21371 Foot drop, right foot: Secondary | ICD-10-CM | POA: Diagnosis not present

## 2021-05-31 DIAGNOSIS — Z91040 Latex allergy status: Secondary | ICD-10-CM | POA: Diagnosis not present

## 2021-05-31 DIAGNOSIS — B9620 Unspecified Escherichia coli [E. coli] as the cause of diseases classified elsewhere: Secondary | ICD-10-CM | POA: Diagnosis not present

## 2021-05-31 DIAGNOSIS — Z9079 Acquired absence of other genital organ(s): Secondary | ICD-10-CM

## 2021-05-31 DIAGNOSIS — M7989 Other specified soft tissue disorders: Secondary | ICD-10-CM | POA: Diagnosis not present

## 2021-05-31 DIAGNOSIS — I82442 Acute embolism and thrombosis of left tibial vein: Secondary | ICD-10-CM | POA: Diagnosis not present

## 2021-05-31 DIAGNOSIS — Z90722 Acquired absence of ovaries, bilateral: Secondary | ICD-10-CM | POA: Diagnosis not present

## 2021-05-31 DIAGNOSIS — K5901 Slow transit constipation: Secondary | ICD-10-CM | POA: Diagnosis not present

## 2021-05-31 DIAGNOSIS — K219 Gastro-esophageal reflux disease without esophagitis: Secondary | ICD-10-CM | POA: Diagnosis present

## 2021-05-31 DIAGNOSIS — Z83438 Family history of other disorder of lipoprotein metabolism and other lipidemia: Secondary | ICD-10-CM

## 2021-05-31 DIAGNOSIS — Z6841 Body Mass Index (BMI) 40.0 and over, adult: Secondary | ICD-10-CM | POA: Diagnosis not present

## 2021-05-31 DIAGNOSIS — I69151 Hemiplegia and hemiparesis following nontraumatic intracerebral hemorrhage affecting right dominant side: Principal | ICD-10-CM

## 2021-05-31 DIAGNOSIS — Z79899 Other long term (current) drug therapy: Secondary | ICD-10-CM

## 2021-05-31 DIAGNOSIS — I611 Nontraumatic intracerebral hemorrhage in hemisphere, cortical: Principal | ICD-10-CM

## 2021-05-31 DIAGNOSIS — Z91013 Allergy to seafood: Secondary | ICD-10-CM | POA: Diagnosis not present

## 2021-05-31 DIAGNOSIS — N390 Urinary tract infection, site not specified: Secondary | ICD-10-CM | POA: Diagnosis not present

## 2021-05-31 DIAGNOSIS — M1990 Unspecified osteoarthritis, unspecified site: Secondary | ICD-10-CM | POA: Diagnosis not present

## 2021-05-31 DIAGNOSIS — M792 Neuralgia and neuritis, unspecified: Secondary | ICD-10-CM | POA: Diagnosis not present

## 2021-05-31 DIAGNOSIS — I619 Nontraumatic intracerebral hemorrhage, unspecified: Secondary | ICD-10-CM | POA: Diagnosis present

## 2021-05-31 DIAGNOSIS — E785 Hyperlipidemia, unspecified: Secondary | ICD-10-CM | POA: Diagnosis present

## 2021-05-31 DIAGNOSIS — Z90710 Acquired absence of both cervix and uterus: Secondary | ICD-10-CM | POA: Diagnosis not present

## 2021-05-31 DIAGNOSIS — M48061 Spinal stenosis, lumbar region without neurogenic claudication: Secondary | ICD-10-CM | POA: Diagnosis present

## 2021-05-31 DIAGNOSIS — Z8249 Family history of ischemic heart disease and other diseases of the circulatory system: Secondary | ICD-10-CM

## 2021-05-31 DIAGNOSIS — I10 Essential (primary) hypertension: Secondary | ICD-10-CM | POA: Diagnosis present

## 2021-05-31 DIAGNOSIS — K5900 Constipation, unspecified: Secondary | ICD-10-CM | POA: Diagnosis present

## 2021-05-31 DIAGNOSIS — Z7982 Long term (current) use of aspirin: Secondary | ICD-10-CM | POA: Diagnosis not present

## 2021-05-31 DIAGNOSIS — E7800 Pure hypercholesterolemia, unspecified: Secondary | ICD-10-CM

## 2021-05-31 DIAGNOSIS — M79604 Pain in right leg: Secondary | ICD-10-CM | POA: Diagnosis not present

## 2021-05-31 LAB — URINE CULTURE: Culture: >=100000 — AB

## 2021-05-31 MED ORDER — PANTOPRAZOLE SODIUM 40 MG PO TBEC
40.0000 mg | DELAYED_RELEASE_TABLET | Freq: Every day | ORAL | Status: DC
  Administered 2021-05-31 – 2021-06-26 (×27): 40 mg via ORAL
  Filled 2021-05-31 (×27): qty 1

## 2021-05-31 MED ORDER — BLOOD PRESSURE CONTROL BOOK
Freq: Once | Status: AC
  Filled 2021-05-31: qty 1

## 2021-05-31 MED ORDER — ROSUVASTATIN CALCIUM 5 MG PO TABS
5.0000 mg | ORAL_TABLET | Freq: Every day | ORAL | Status: DC
  Administered 2021-05-31 – 2021-06-01 (×2): 5 mg via ORAL
  Filled 2021-05-31 (×2): qty 1

## 2021-05-31 MED ORDER — SULFAMETHOXAZOLE-TRIMETHOPRIM 800-160 MG PO TABS
1.0000 | ORAL_TABLET | Freq: Two times a day (BID) | ORAL | Status: DC
Start: 2021-05-31 — End: 2021-05-31

## 2021-05-31 MED ORDER — ACETAMINOPHEN 650 MG RE SUPP: 650.0000 mg | RECTAL | Status: DC | PRN

## 2021-05-31 MED ORDER — ACETAMINOPHEN 160 MG/5ML PO SOLN
650.0000 mg | ORAL | Status: DC | PRN
  Administered 2021-06-04: 650 mg
  Filled 2021-05-31: qty 20.3

## 2021-05-31 MED ORDER — RISAQUAD PO CAPS
1.0000 | ORAL_CAPSULE | Freq: Every day | ORAL | Status: DC
  Administered 2021-05-31 – 2021-06-27 (×28): 1 via ORAL
  Filled 2021-05-31 (×30): qty 1

## 2021-05-31 MED ORDER — SULFAMETHOXAZOLE-TRIMETHOPRIM 800-160 MG PO TABS: 1.0000 | ORAL_TABLET | Freq: Two times a day (BID) | ORAL | Status: DC

## 2021-05-31 MED ORDER — EXERCISE FOR HEART AND HEALTH BOOK
Freq: Once | Status: AC
  Filled 2021-05-31: qty 1

## 2021-05-31 MED ORDER — ACETAMINOPHEN 325 MG PO TABS
650.0000 mg | ORAL_TABLET | ORAL | Status: DC | PRN
  Administered 2021-06-01 – 2021-06-18 (×14): 650 mg via ORAL
  Filled 2021-05-31 (×14): qty 2

## 2021-05-31 MED ORDER — SENNOSIDES-DOCUSATE SODIUM 8.6-50 MG PO TABS
1.0000 | ORAL_TABLET | Freq: Two times a day (BID) | ORAL | Status: DC
  Administered 2021-05-31 – 2021-06-27 (×54): 1 via ORAL
  Filled 2021-05-31 (×55): qty 1

## 2021-05-31 MED ORDER — MAGNESIUM GLUCONATE 500 MG PO TABS
250.0000 mg | ORAL_TABLET | Freq: Every day | ORAL | Status: DC
  Administered 2021-05-31 – 2021-06-26 (×27): 250 mg via ORAL
  Filled 2021-05-31 (×27): qty 1

## 2021-05-31 NOTE — Plan of Care (Signed)
°  Problem: Education: Goal: Knowledge of disease or condition will improve Outcome: Progressing Goal: Knowledge of secondary prevention will improve (SELECT ALL) Outcome: Progressing Goal: Knowledge of patient specific risk factors will improve (INDIVIDUALIZE FOR PATIENT) Outcome: Progressing Goal: Individualized Educational Video(s) Outcome: Progressing   Problem: Coping: Goal: Will verbalize positive feelings about self Outcome: Progressing Goal: Will identify appropriate support needs Outcome: Progressing   Problem: Health Behavior/Discharge Planning: Goal: Ability to manage health-related needs will improve Outcome: Progressing   Problem: Self-Care: Goal: Ability to participate in self-care as condition permits will improve Outcome: Progressing Goal: Verbalization of feelings and concerns over difficulty with self-care will improve Outcome: Progressing   Problem: Intracerebral Hemorrhage Tissue Perfusion: Goal: Complications of Intracerebral Hemorrhage will be minimized Outcome: Progressing

## 2021-05-31 NOTE — Progress Notes (Signed)
Horton Chin, MD  Physician Physical Medicine and Rehabilitation PMR Pre-admission    Signed Date of Service:  05/30/2021  9:27 AM  Related encounter: Admission (Discharged) from 05/27/2021 in [facility]   Signed      Show:Clear all [x] Written[x] Templated[x] Copied  Added by: [x] Standley Brooking, RN[x] Carlis Abbott Drema Pry, MD  [] Hover for details                                                                                                                                                                                                                                                                                                                                                                                                                                              PMR Admission Coordinator Pre-Admission Assessment   Patient: Terri Werner is an 71 y.o., female MRN: [id number] DOB: [birth year] Height: 5' 2.01" (157.5 cm) Weight: 104.3 kg   Insurance Information HMO: yes    PPO:      PCP:      IPA:      80/20:      OTHER:  PRIMARY: Blue Medicare      Policy#: [id number]      Subscriber: pt CM Name: Carollee Herter      Phone#: [phone]     Fax#: [phone] Pre-Cert#: TBD approved for 7 days     Employer:  Benefits:  Phone #: [phone]     Name: 1/13 Eff. Date: 05/14/2021     Deduct: none      Out of Pocket Max: $3950      Life Max: none CIR: $355 co pay per day days 1 until 6      SNF: no copay days 1 until 20; $196 c copay per days 21 until 60; no  copay days 61 until 100 Outpatient: $10 per visit     Co-Pay: visits per medical neccesity Home Health: 100%      Co-Pay: visits epr medical neccesity DME: 80%     Co-Pay: 20% Providers: in network  SECONDARY: none         Financial Counselor:        Phone#:    The Best boy for patients in Inpatient Rehabilitation Facilities with attached Privacy Act Statement-Health Care Records was provided and verbally reviewed with: Patient   Emergency Contact Information Contact Information       Name Relation Home Work Welcome Sister [phone]        Manus Rudd Daughter [phone]   [phone]    [facility] Niece     [phone]         Current Medical History  Patient Admitting Diagnosis: ICH   History of Present Illness: 71 year old right-handed female with history of hepatitis B, hyperlipidemia, hypertension but ran out of her medications and did not seek a refill.   Presented 05/27/2021 with sudden onset of right leg weakness and mild headache.  Cranial CT scan showed acute parenchymal hemorrhage in the parasagittal left frontal parietal lobes with mild edema.  No substantial mass-effect.  CT angiogram head and neck no abnormal vascularity in the region of hemorrhage no hemodynamically significant stenosis.  MRI follow-up showed no mass or infarct seen underlying the left posterior frontal hematoma.  Admission chemistries were unremarkable except glucose 103, urine drug screen negative, urine culture greater 100,000 gram-negative rods.  Patient was placed on Keflex x5 days for UTI.  EEG showed no seizure activity.  Echocardiogram with ejection fraction of 60 to 65% no wall motion abnormalities grade 1 diastolic dysfunction.  Neurology follow-up IPH likely caused by hypertension monitored closely and she was initially maintained on Cleviprex.  Tolerating a regular consistency diet.     Complete NIHSS TOTAL: 0   Patient's medical record from [facility] has been reviewed by the rehabilitation admission coordinator and physician.   Past Medical History      Past Medical History:  Diagnosis Date   Arthritis      pt reports in back & knees   Colon polyps 2010    At [facility]    Diverticulitis 2016   Genital warts     GERD (gastroesophageal reflux disease)     Heart murmur     Hepatitis B     History of blood transfusion      during each major surgery per pt   History of chicken pox     History of hiatal hernia     History of torn meniscus of right knee     HTN (hypertension)     Hx of migraine headaches     Hypertension     Hypopotassemia      Has the patient had major surgery during 100 days prior to admission? No   Family History   family history includes Alzheimer's disease in her mother; Birth defects in her maternal grandmother; Breast cancer (age of onset:  49) in her sister; Colon cancer in her maternal grandmother; Congestive Heart Failure in her father; Dementia in her mother, paternal grandfather, and paternal grandmother; Hyperlipidemia in her father and maternal grandfather; Hypertension in her maternal grandfather and mother; Thyroid disease in her sister.   Current Medications   Current Facility-Administered Medications:    acetaminophen (TYLENOL) tablet 650 mg, 650 mg, Oral, Q4H PRN, 650 mg at 05/31/21 0624 **OR** acetaminophen (TYLENOL) 160 MG/5ML solution 650 mg, 650 mg, Per Tube, Q4H PRN **OR** acetaminophen (TYLENOL) suppository 650 mg, 650 mg, Rectal, Q4H PRN, Caryl Pina, MD   cephALEXin (KEFLEX) capsule 500 mg, 500 mg, Oral, Q6H, de Saintclair Halsted, Cortney E, NP, 500 mg at 05/31/21 0620   Chlorhexidine Gluconate Cloth 2 % PADS 6 each, 6 each, Topical, Q0600, Caryl Pina, MD, 6 each at 05/31/21 0865   hydrALAZINE (APRESOLINE) injection 10 mg, 10 mg, Intravenous, Q4H PRN, Milon Dikes, MD, 10 mg at 05/29/21 0831   labetalol (NORMODYNE) injection 10 mg, 10 mg, Intravenous, Q2H PRN, Milon Dikes, MD, 10 mg at 05/29/21 1827   pantoprazole (PROTONIX) EC tablet 40 mg, 40 mg, Oral, QHS, Pearlean Brownie, Pramod S, MD, 40 mg at 05/30/21 2307   senna-docusate (Senokot-S) tablet 1 tablet, 1 tablet, Oral, BID, Caryl Pina, MD, 1 tablet at 05/31/21 7846    Patients Current Diet:  Diet Order                  Diet regular Room service appropriate? Yes; Fluid consistency: Thin  Diet effective now                       Precautions / Restrictions Precautions Precautions: Fall Precaution Comments: R hemiparesis, extensor synergy dynamically RLE Restrictions Weight Bearing Restrictions: No    Has the patient had 2 or more falls or a fall with injury in the past year? 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 Independent   Indoor Mobility: Did the patient need assistance with walking from room to room (with or without device)? Independent   Stairs: Did the patient need assistance with internal or external stairs (with or without device)? Independent   Functional Cognition: Did the patient need help planning regular tasks such as shopping or remembering to take medications? Independent   Patient Information Are you of Hispanic, Latino/a,or Spanish origin?: A. No, not of Hispanic, Latino/a, or Spanish origin What is your race?: B. Black or African American Do you need or want an interpreter to communicate with a doctor or health care staff?: 0. No   Patient's Response To:  Health Literacy and Transportation Is the patient able to respond to health literacy and transportation needs?: Yes Health Literacy - How often do you need to have someone help you when you read instructions, pamphlets, or other written material from your doctor or pharmacy?: Never In the past 12 months, has lack of transportation kept you from medical appointments or from getting medications?: No In the past 12 months, has lack of transportation kept you from meetings, work, or from getting things needed for daily living?: No   Journalist, newspaper / Equipment Home Assistive Devices/Equipment:  None Home Equipment: Shower seat, Agricultural consultant (2 wheels)   Prior Device Use: Indicate devices/aids used by the patient prior to current illness, exacerbation or injury? None of the above  Current Functional Level Cognition   Arousal/Alertness: Awake/alert Overall Cognitive Status: Impaired/Different from baseline Orientation Level: Oriented X4 Following Commands: Follows one step commands with increased time General Comments: Pt aware of deficits today, can state when she is leaning R or when RLE is buckling. Continued R inattention especially to UE Attention: Focused, Sustained, Alternating Focused Attention: Appears intact Sustained Attention: Appears intact Alternating Attention: Appears intact Memory: Appears intact Awareness: Appears intact Problem Solving: Impaired Problem Solving Impairment: Functional complex, Verbal complex Safety/Judgment: Appears intact    Extremity Assessment (includes Sensation/Coordination)   Upper Extremity Assessment: Defer to OT evaluation RUE Deficits / Details: Decreased strength. Able to perform ROM and finger opposition with increased time. decreased sensation. poor proprioception and attention to RUE RUE Sensation: decreased proprioception RUE Coordination: decreased fine motor, decreased gross motor  Lower Extremity Assessment: RLE deficits/detail RLE Deficits / Details: 2/5 knee extension/extension, 1/5 hip flexion, 1/5 DF/PF. hypertonicity in hip adduction RLE Sensation: decreased light touch     ADLs   Overall ADL's : Needs assistance/impaired Eating/Feeding: Minimal assistance, Sitting Grooming: Moderate assistance, Sitting Upper Body Bathing: Moderate assistance, Sitting Lower Body Bathing: Total assistance, Sit to/from stand, +2 for physical assistance, +2 for safety/equipment Upper Body Dressing : Maximal assistance, Sitting Lower Body Dressing: Total assistance, Sit to/from stand, +2 for physical assistance, +2 for  safety/equipment Toilet Transfer: Maximal assistance, +2 for physical assistance, +2 for safety/equipment, Stand-pivot (simulated to recliner) General ADL Comments: Pt very motivated. Poor attention to R and lateral leaning.     Mobility   Overal bed mobility: Needs Assistance Bed Mobility: Supine to Sit Supine to sit: Mod assist General bed mobility comments: assist for sequencing, RLE progression, and trunk elevation. PT cuing pt to use bedrails with RUE, bed exit to pt L     Transfers   Overall transfer level: Needs assistance Equipment used: Rolling walker (2 wheels) Transfers: Sit to/from Stand, Bed to chair/wheelchair/BSC Sit to Stand: Mod assist, +2 physical assistance, +2 safety/equipment, From elevated surface Bed to/from chair/wheelchair/BSC transfer type:: Stand pivot Stand pivot transfers: Mod assist, +2 physical assistance, +2 safety/equipment General transfer comment: mod +2 for power up, rise, correcting RLE buckle and R lateral leaning. Step pivot to recliner towards pt's L, cues for weight shifting to facilitate stepping and blocking RLE when buckling. RLE extensor synergy during swing stepping     Ambulation / Gait / Stairs / Clinical biochemist / Balance Dynamic Sitting Balance Sitting balance - Comments: able to sit EOB without PT support today Balance Overall balance assessment: Needs assistance Sitting-balance support: Single extremity supported, Feet supported, No upper extremity supported Sitting balance-Leahy Scale: Fair Sitting balance - Comments: able to sit EOB without PT support today Standing balance support: Bilateral upper extremity supported, During functional activity, Reliant on assistive device for balance Standing balance-Leahy Scale: Poor Standing balance comment: reliant on RW and PT/PT aide support     Special needs/care consideration      Previous Home Environment  Living Arrangements: Alone  Lives With: Alone Available  Help at Discharge: Family, Available 24 hours/day (sister, Luster Landsberg, niece and daughter to asisst) Type of Home: House Home Layout: One level Home Access: Ramped entrance Bathroom Shower/Tub: Health visitor: Handicapped height Bathroom Accessibility: Yes How Accessible: Accessible via walker Home Care Services: No   Discharge Living Setting Plans for Discharge Living Setting: Patient's home (sister, niece and local daughter to provide 24/7 assist) Type of Home at Discharge: House  Discharge Home Layout: One level Discharge Home Access: Ramped entrance Discharge Bathroom Shower/Tub: Walk-in shower Discharge Bathroom Toilet: Handicapped height Discharge Bathroom Accessibility: Yes How Accessible: Accessible via walker Does the patient have any problems obtaining your medications?: No   Social/Family/Support Systems Patient Roles: Parent Contact Information: sister, Luster Landsberg and daughter, Massie Bougie Anticipated Caregiver: sister, niece and local daughter Anticipated Caregiver's Contact Information: see contacts Ability/Limitations of Caregiver: sister and niece to arrnage work schedules and they work from home Caregiver Availability: 24/7 Discharge Plan Discussed with Primary Caregiver: Yes Is Caregiver In Agreement with Plan?: Yes Does Caregiver/Family have Issues with Lodging/Transportation while Pt is in Rehab?: No     She has long term care policy with [employer] that daughter is calling to begin ability to use for caregiver supports at home.   Goals Patient/Family Goal for Rehab: min assist with PT and OT, Mod I with SLP Expected length of stay: ELOS 14 to 20 days Additional Information: Patient has longterm insurance with [employer] Pt/Family Agrees to Admission and willing to participate: Yes Program Orientation Provided & Reviewed with Pt/Caregiver Including Roles  & Responsibilities: Yes   Decrease burden of Care through IP rehab admission: n/a   Possible need for  SNF placement upon discharge: not anticipated   Patient Condition: I have reviewed medical records from [facility], spoken with CM, and patient and family member. I met with patient at the bedside for inpatient rehabilitation assessment.  Patient will benefit from ongoing PT, OT, and SLP, can actively participate in 3 hours of therapy a day 5 days of the week, and can make measurable gains during the admission.  Patient will also benefit from the coordinated team approach during an Inpatient Acute Rehabilitation admission.  The patient will receive intensive therapy as well as Rehabilitation physician, nursing, social worker, and care management interventions.  Due to bladder management, bowel management, safety, skin/wound care, disease management, medication administration, pain management, and patient education the patient requires 24 hour a day rehabilitation nursing.  The patient is currently mod to max assist overall with mobility and basic ADLs.  Discharge setting and therapy post discharge at home with home health is anticipated.  Patient has agreed to participate in the Acute Inpatient Rehabilitation Program and will admit today.   Preadmission Screen Completed By:  Clois Dupes, 05/31/2021 9:37 AM ______________________________________________________________________   Discussed status with Dr. Carlis Abbott on 05/31/2021 at [phone] and received approval for admission today.   Admission Coordinator:  Clois Dupes, RN, time 9604 Date  05/31/2021    Assessment/Plan: Diagnosis: ICH Does the need for close, 24 hr/day Medical supervision in concert with the patient's rehab needs make it unreasonable for this patient to be served in a less intensive setting? Yes Co-Morbidities requiring supervision/potential complications: morbid obesity, HTN, HLD, carotid artery disorder, diverticulosis of colon Due to bladder management, bowel management, safety, skin/wound care, disease  management, medication administration, pain management, and patient education, does the patient require 24 hr/day rehab nursing? Yes Does the patient require coordinated care of a physician, rehab nurse, PT, OT to address physical and functional deficits in the context of the above medical diagnosis(es)? Yes Addressing deficits in the following areas: balance, endurance, locomotion, strength, transferring, bowel/bladder control, bathing, dressing, feeding, grooming, toileting, and psychosocial support Can the patient actively participate in an intensive therapy program of at least 3 hrs of therapy 5 days a week? Yes The potential for patient to make measurable gains while on inpatient rehab is excellent Anticipated functional outcomes  upon discharge from inpatient rehab: min assist PT, min assist OT, independent SLP Estimated rehab length of stay to reach the above functional goals is: 12-16 days Anticipated discharge destination: Home 10. Overall Rehab/Functional Prognosis: good     MD Signature: Sula Soda, MD         Revision History                                    Note Details  Author Horton Chin, MD File Time 05/31/2021  9:45 AM  Author Type Physician Status Signed  Last Editor Horton Chin, MD Service Physical Medicine and Rehabilitation  Hospital Acct # [id number] Admit Date 05/31/2021

## 2021-05-31 NOTE — Progress Notes (Signed)
Alert and oriented x4. Admitted to the IP Rehab today with no issues. Patient oriented to unit and assigned room. Pleasant mood. No complaints of pain, no distress noted. Admission/Assessments complete.

## 2021-05-31 NOTE — Progress Notes (Signed)
Inpatient Rehabilitation Admission Medication Review by a Pharmacist  A complete drug regimen review was completed for this patient to identify any potential clinically significant medication issues.  High Risk Drug Classes Is patient taking? Indication by Medication  Antipsychotic No   Anticoagulant No   Antibiotic Yes Septra - UTI  Opioid No   Antiplatelet No   Hypoglycemics/insulin No   Vasoactive Medication No   Chemotherapy No   Other Yes Protonix - GERD     Type of Medication Issue Identified Description of Issue Recommendation(s)  Drug Interaction(s) (clinically significant)     Duplicate Therapy     Allergy     No Medication Administration End Date     Incorrect Dose     Additional Drug Therapy Needed  Resume crestor at discharge [place] to resume per Marlowe Shores, PA  Significant med changes from prior encounter (inform family/care partners about these prior to discharge).    Other       Clinically significant medication issues were identified that warrant physician communication and completion of prescribed/recommended actions by midnight of the next day:  Yes>>No  Name of provider notified for urgent issues identified: Marlowe Shores, PA  Provider Method of Notification: Secure chat    Pharmacist comments:   Time spent performing this drug regimen review (minutes):  15

## 2021-05-31 NOTE — Progress Notes (Signed)
STROKE TEAM PROGRESS NOTE   INTERVAL HISTORY Patient is sitting up comfortably in bed with no family at the bedside.    She remains hemodynamically stable, and her neurological exam is stable.  She will hopefully be discharged to CIR today.  She continues to have right lower extremity weakness which is unchanged.  She is interested in participating in [place]  trial for stroke prevention (statin versus no statin after intracerebral hemorrhage)  Vitals:   05/30/21 2326 05/31/21 0117 05/31/21 0400 05/31/21 0810  BP: (!) 146/78 139/77 126/68 (!) 161/104  Pulse: 69 97 77 77  Resp: 18 16 17 17   Temp: 97.8 F (36.6 C) 98.5 F (36.9 C) (!) 97.4 F (36.3 C)   TempSrc: Oral Oral Axillary   SpO2: 100% 100% 99% 100%  Weight:      Height:       CBC:  Recent Labs  Lab 05/27/21 1458  WBC 5.9  NEUTROABS 3.3  HGB 12.5  HCT 39.8  MCV 89.2  PLT 893   Basic Metabolic Panel:  Recent Labs  Lab 05/27/21 1458  NA 136  K 4.7  CL 106  CO2 25  GLUCOSE 93  BUN 13  CREATININE 0.74  CALCIUM 9.1   Lipid Panel: No results for input(s): CHOL, TRIG, HDL, CHOLHDL, VLDL, LDLCALC in the last 168 hours. HgbA1c: No results for input(s): HGBA1C in the last 168 hours. Urine Drug Screen:  Recent Labs  Lab 05/28/21 0713  LABOPIA NONE DETECTED  COCAINSCRNUR NONE DETECTED  LABBENZ NONE DETECTED  AMPHETMU NONE DETECTED  THCU NONE DETECTED  LABBARB NONE DETECTED    Alcohol Level No results for input(s): ETH in the last 168 hours.  IMAGING past 24 hours No results found.  PHYSICAL EXAM  Physical Exam  Constitutional: Appears well-developed and well-nourished pleasant elderly lady.  Psych: Affect appropriate to situation Respiratory: Effort normal, non-labored breathing  Neuro: Mental Status: Patient is awake, alert, oriented to person, place, month, year, and situation. Patient is able to give a clear and coherent history. No signs of aphasia or neglect Cranial Nerves: II: Pupils are equal,  round, and reactive to light.   III,IV, VI: EOMI without ptosis or diploplia.  V: Facial sensation is symmetric to light touch VII: Smile is symmetrical VIII: Hearing is intact to voice X: Phonation is normal XII: Tongue protrudes midline without atrophy or fasciculations.  Motor: Right hemiparesis with RLE 3/5, RUE 4/5,  LLE 5/5, LUE 5/5 Fine motor control diminished in RUE, hand grasp in right hand 4/5 Sensory: Sensation is symmetric to light touch in the arms and legs.  Coordination: Past pointing with right FNF. No ataxia noted. HKS deferred due to weakness.   ASSESSMENT/PLAN Terri Werner is a 71 y.o. female with history of HTN, arthritis, diverticulitis, heart murmur, hep B, and migraines presenting with sudden onset of right leg weakness. She ran out of her medications and had her last dose of amlodipine on Monday. Her prescription ran out and there was some sort of misunderstanding in getting it refilled so she has been taking her Norvasc sporadically instead of daily until she could get it refilled.  She drove herself to church this morning and in the afternoon noted that she could not move her right leg.  She was brought in as an acute code stroke to [facility] where she was found to have a left high frontal ICH.  She did state that she had a colonoscopy Monday and she does have  a heart murmur. Last known well 1300 hrs. Head CT showed an IPH in the left parasagittal frontoparietal lobe. At 2230 on 1/14 RN noted right foot twitching every few minutes lasting over 30 minutes, neurohospitalist notified. Concern for possible seizure, she was given 1mg  of ativan, 2g Keppra load. EEG and Echo pending. MRI with contrast performed to rule out mets source.  MRI reveals numerous microhemorrhages, and IPH was likely caused by hypertension.  Will repeat MRI in 6 months, no angiogram needed. Patient to be discharged to CIR soon.  Stroke team will sign off but will  remain available for any questions or concerns.  Stroke:   IPH in the left parasagittal frontoparietal lobes likely secondary uncontrolled hypertensive source vs metastatic source doubt endocarditis given history of colonoscopy earlier last week. Code Stroke acute IPH in the parasagittal left frontoparietal lobes with mild edema.  No substantial mass-effect.  No IVH. Repeat CT- unchanged size of Lt frontal lobe IPH CTA head & neck No hemodynamically significant stenosis. CT venogram negative for dural venous sinus thrombus MRI  No mass or infarct underlying left frontal IPH, innumerable remote lobar microhemorrhages and white matter gliosis compatible with amyloid angiopathy 2D Echo EF 60-65%, mild LVH, grade 1 diastolic dysfunction, normal atrial septum EEG Normal awake study with no epileptiform discharges LDL 101 HgbA1c 5. 4  neurological monitoring and strict blood pressure control with systolic below 856 VTE prophylaxis - SCDs aspirin 81 mg daily prior to admission, now on No antithrombotic r/t IPH Therapy recommendations:  CIR Disposition:  Pending  Hypertension Home meds:  Norvasc 2.5mg   Stable BP systolic less than 314  Hyperlipidemia Home meds:  Rosuvastatin 5 mg daily, will resume at discharge  LDL 101, goal < 70 Continue statin at discharge  Other Stroke Risk Factors ETOH use, advised to drink no more than 1 drink(s) a day Obesity, Body mass index is 42.05 kg/m., BMI >/= 30 associated with increased stroke risk, recommend weight loss, diet and exercise as appropriate  Hx stroke/TIA  Other Active Problems Possible seizure activity Loaded with 2g Keppra EEG reveals no seizure activity   Hospital day # 4    Continue ongoing physical occupational therapy and transfer to inpatient rehab when bed available.  Continue strict control of hypertension with blood pressure goal below 970 systolic.  Follow-up with outpatient stroke clinic in 2 months.  Greater than 50% time  during this 35-minute visit were spent in counseling and coordination of care about intracerebral hemorrhage and discussion prevention and treatment and answering questions and discussion with care team.  Patient interested in participating in the [place] trial for stroke prevention and will be given information to review and decide stroke team will sign off.  Kindly call for questions.  Greater than 50% time during this 25-minute visit was spent in counseling and coordination of care and discussion patient and care team and answering questions. Antony Contras, MD Medical Director [facility] Pager: [phone] 05/31/2021 2:19 PM    To contact Stroke Continuity provider, please refer to [email or web address] After hours, contact General Neurology

## 2021-05-31 NOTE — Discharge Instructions (Addendum)
Inpatient Rehab Discharge Instructions  Terri Werner [facility] Discharge date and time: No discharge date for patient encounter.   Activities/Precautions/ Functional Status: Activity: activity as tolerated Diet: regular diet Wound Care: Routine skin checks Functional status:  ___ No restrictions     ___ Walk up steps independently ___ 24/7 supervision/assistance   ___ Walk up steps with assistance ___ Intermittent supervision/assistance  ___ Bathe/dress independently ___ Walk with walker     __x_ Bathe/dress with assistance ___ Walk Independently    ___ Shower independently ___ Walk with assistance    ___ Shower with assistance ___ No alcohol     ___ Return to work/school ________  COMMUNITY REFERRALS UPON DISCHARGE:    Home Health:   PT     OT                    Agency: [facility]  Phone: [phone]    Medical Equipment/Items Ordered: Bedside Commode, Tub Transfer Bench and Transfer Bench                                                 Agency/Supplier: [employer] [phone]  Special Instructions: No driving smoking or alcohol   My questions have been answered and I understand these instructions. I will adhere to these goals and the provided educational materials after my discharge from the hospital.  Patient/Caregiver Signature _______________________________ Date __________  Clinician Signature _______________________________________ Date __________  Please bring this form and your medication list with you to all your follow-up doctor's appointments.

## 2021-05-31 NOTE — H&P (Signed)
Physical Medicine and Rehabilitation Admission H&P  CC: ICH   HPI: Terri Werner. Terri Werner is a 71 year old right-handed female with history of hepatitis B, hyperlipidemia, hypertension but ran out of her medications and did not seek a refill.  Per chart review patient lives alone.  1 level home ramped entrance.  Recently retired in November.  She has a daughter and niece in the area.  Presented 05/27/2021 with sudden onset of right leg weakness and mild headache.  Cranial CT scan showed acute parenchymal hemorrhage in the parasagittal left frontal parietal lobes with mild edema.  No substantial mass-effect.  CT angiogram head and neck no abnormal vascularity in the region of hemorrhage no hemodynamically significant stenosis.  MRI follow-up showed no mass or infarct seen underlying the left posterior frontal hematoma.  Admission chemistries were unremarkable except glucose 103, urine drug screen negative, urine culture greater 100,000 gram-negative rods.  Patient was placed on Keflex x5 days for UTI.  EEG showed no seizure activity.  Echocardiogram with ejection fraction of 60 to 65% no wall motion abnormalities grade 1 diastolic dysfunction.  Neurology follow-up IPH likely caused by hypertension monitored closely and she was initially maintained on Cleviprex.  Tolerating a regular consistency diet.  Therapy evaluations completed due to patient's right side weakness decreased functional mobility was admitted for a comprehensive rehab program. Currently complains of weakness.   Review of Systems  Constitutional:  Negative for chills and fever.  HENT:  Negative for hearing loss.   Eyes:  Negative for blurred vision and double vision.  Respiratory:  Negative for cough and shortness of breath.   Cardiovascular:  Negative for chest pain, palpitations and leg swelling.  Gastrointestinal:  Positive for constipation. Negative for heartburn, nausea and vomiting.       GERD  Genitourinary:  Negative for  dysuria, flank pain and hematuria.  Musculoskeletal:  Positive for myalgias.  Skin:  Negative for rash.  Neurological:  Positive for weakness and headaches.  All other systems reviewed and are negative. Past Medical History:  Diagnosis Date   Arthritis    pt reports in back & knees   Colon polyps 2010   At [facility]   Diverticulitis 2016   Genital warts    GERD (gastroesophageal reflux disease)    Heart murmur    Hepatitis B    History of blood transfusion    during each major surgery per pt   History of chicken pox    History of hiatal hernia    History of torn meniscus of right knee    HTN (hypertension)    Hx of migraine headaches    Hypertension    Hypopotassemia    Past Surgical History:  Procedure Laterality Date   ABDOMINAL HYSTERECTOMY  2006   [place]   COLONOSCOPY WITH PROPOFOL N/A 11/01/2014   Procedure: COLONOSCOPY WITH PROPOFOL;  Surgeon: Manya Silvas, MD;  Location: [facility];  Service: Endoscopy;  Laterality: N/A;   COLONOSCOPY WITH PROPOFOL N/A 01/05/2020   Procedure: COLONOSCOPY WITH PROPOFOL;  Surgeon: [name], MD;  Location: [facility] ENDOSCOPY;  Service: Endoscopy;  Laterality: N/A;   [place]   HERNIA REPAIR  [id number]   Umbilical   KNEE ARTHROSCOPY Left 2006   torn meniscus   TONSILLECTOMY  1971   TOTAL ABDOMINAL HYSTERECTOMY W/ BILATERAL SALPINGOOPHORECTOMY  2006   VESICOVAGINAL FISTULA CLOSURE W/ TAH     Family History  Problem Relation  Age of Onset   Breast cancer Sister 31   Hypertension Mother    Alzheimer's disease Mother    Dementia Mother    Hypertension Maternal Grandfather    Hyperlipidemia Maternal Grandfather    Birth defects Maternal Grandmother        Bladder   Colon cancer Maternal Grandmother    Congestive Heart Failure Father    Hyperlipidemia Father    Dementia Paternal Grandmother    Dementia Paternal Grandfather    Thyroid disease Sister    Social  History:  reports that she has never smoked. She has never used smokeless tobacco. She reports current alcohol use. She reports that she does not use drugs. Allergies:  Allergies  Allergen Reactions   Shrimp [Shellfish Allergy] Swelling    Lips will burn    Latex Itching    Burning   Medications Prior to Admission  Medication Sig Dispense Refill   acetaminophen (TYLENOL) 500 MG tablet Take 1,000 mg by mouth every 6 (six) hours as needed for headache (pain).     amLODipine (NORVASC) 2.5 MG tablet Take 1 tablet (2.5 mg total) by mouth daily. (Patient not taking: Reported on 05/27/2021) 90 tablet 1   aspirin EC 81 MG tablet Take 81 mg by mouth daily. Swallow whole. (Patient not taking: Reported on 05/27/2021)     Cholecalciferol (VITAMIN D) 2000 UNITS CAPS Take 2,000 Units by mouth daily.     rosuvastatin (CRESTOR) 10 MG tablet Take 1 tablet (10 mg total) by mouth daily. TAKE 1 TABLET(5 MG) BY MOUTH DAILY (Patient not taking: Reported on 05/27/2021) 90 tablet 1   rosuvastatin (CRESTOR) 5 MG tablet Take 5 mg by mouth daily.      Drug Regimen Review Drug regimen was reviewed and remains appropriate with no significant issues identified  Home: Home Living Family/patient expects to be discharged to:: Private residence Living Arrangements: Alone Available Help at Discharge: Family, Available 24 hours/day (sister, Terri Werner, niece and daughter to asisst) Type of Home: House Home Access: Ramped entrance Home Layout: One level Bathroom Shower/Tub: Multimedia programmer: Handicapped height Bathroom Accessibility: Yes Home Equipment: Civil engineer, contracting, Conservation officer, nature (2 wheels)  Lives With: Alone   Functional History: Prior Function Prior Level of Function : Independent/Modified Independent, Driving Mobility Comments: 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 Performing ADLs and IADLs.   Functional Status:  Mobility: Bed Mobility Overal bed mobility: Needs Assistance Bed  Mobility: Supine to Sit Supine to sit: Mod assist General bed mobility comments: assist for sequencing, RLE progression, and trunk elevation. PT cuing pt to use bedrails with RUE, bed exit to pt L Transfers Overall transfer level: Needs assistance Equipment used: Rolling walker (2 wheels) Transfers: Sit to/from Stand, Bed to chair/wheelchair/BSC Sit to Stand: Mod assist, +2 physical assistance, +2 safety/equipment, From elevated surface Bed to/from chair/wheelchair/BSC transfer type:: Stand pivot Stand pivot transfers: Mod assist, +2 physical assistance, +2 safety/equipment General transfer comment: mod +2 for power up, rise, correcting RLE buckle and R lateral leaning. Step pivot to recliner towards pt's L, cues for weight shifting to facilitate stepping and blocking RLE when buckling. RLE extensor synergy during swing stepping   ADL: ADL Overall ADL's : Needs assistance/impaired Eating/Feeding: Minimal assistance, Sitting Grooming: Moderate assistance, Sitting Upper Body Bathing: Moderate assistance, Sitting Lower Body Bathing: Total assistance, Sit to/from stand, +2 for physical assistance, +2 for safety/equipment Upper Body Dressing : Maximal assistance, Sitting Lower Body Dressing: Total assistance, Sit to/from stand, +2 for physical assistance, +2 for safety/equipment  Toilet Transfer: Maximal assistance, +2 for physical assistance, +2 for safety/equipment, Stand-pivot (simulated to recliner) General ADL Comments: Pt very motivated. Poor attention to R and lateral leaning.   Cognition: Cognition Overall Cognitive Status: Impaired/Different from baseline Arousal/Alertness: Awake/alert Orientation Level: Oriented X4 Attention: Focused, Sustained, Alternating Focused Attention: Appears intact Sustained Attention: Appears intact Alternating Attention: Appears intact Memory: Appears intact Immediate Memory Recall: Sheila Oats, Bed Memory Recall Sock: Without Cue Memory Recall Blue:  Without Cue Memory Recall Bed: Without Cue Awareness: Appears intact Problem Solving: Impaired Problem Solving Impairment: Functional complex, Verbal complex Safety/Judgment: Appears intact Cognition Arousal/Alertness: Awake/alert Behavior During Therapy: WFL for tasks assessed/performed Overall Cognitive Status: Impaired/Different from baseline Area of Impairment: Following commands, Problem solving Following Commands: Follows one step commands with increased time Awareness: Emergent Problem Solving: Difficulty sequencing, Requires verbal cues, Requires tactile cues General Comments: Pt aware of deficits today, can state when she is leaning R or when RLE is buckling. Continued R inattention especially to UE  Physical Exam: Blood pressure (!) 161/90, pulse 75, temperature 97.8 F (36.6 C), resp. rate 18, SpO2 99 %. Gen: no distress, normal appearing HEENT: oral mucosa pink and moist, NCAT Cardio: Reg rate Chest: normal effort, normal rate of breathing Abd: soft, non-distended Ext: no edema Psych: pleasant, normal affect Skin: intact Neurological:     Comments: Patient is alert.  Makes eye contact with examiner.  Follows simple commands.  Provides name and age but did have some difficulty with recall of hospital course. Left sided strength intact, RU 4/5, RLE 3/5. Sensation intact.  Results for orders placed or performed during the hospital encounter of 05/27/21 (from the past 48 hour(s))  Urine Culture     Status: Abnormal   Collection Time: 05/29/21  1:47 PM   Specimen: Urine, Clean Catch  Result Value Ref Range   Specimen Description URINE, CLEAN CATCH    Special Requests      NONE Performed at [facility], [place] [address], [place], [place] [address]    Culture (A)     >=100,000 COLONIES/mL ESCHERICHIA COLI 40,000 COLONIES/mL KLEBSIELLA PNEUMONIAE    Report Status 05/31/2021 FINAL    Organism ID, Bacteria ESCHERICHIA COLI (A)    Organism ID, Bacteria KLEBSIELLA  PNEUMONIAE (A)       Susceptibility   Escherichia coli - MIC*    AMPICILLIN >=32 RESISTANT Resistant     CEFAZOLIN >=64 RESISTANT Resistant     CEFEPIME <=0.12 SENSITIVE Sensitive     CEFTRIAXONE 0.5 SENSITIVE Sensitive     CIPROFLOXACIN <=0.25 SENSITIVE Sensitive     GENTAMICIN <=1 SENSITIVE Sensitive     IMIPENEM <=0.25 SENSITIVE Sensitive     NITROFURANTOIN <=16 SENSITIVE Sensitive     TRIMETH/SULFA <=20 SENSITIVE Sensitive     AMPICILLIN/SULBACTAM 16 INTERMEDIATE Intermediate     PIP/TAZO <=4 SENSITIVE Sensitive     * >=100,000 COLONIES/mL ESCHERICHIA COLI   Klebsiella pneumoniae - MIC*    AMPICILLIN RESISTANT Resistant     CEFAZOLIN <=4 SENSITIVE Sensitive     CEFEPIME <=0.12 SENSITIVE Sensitive     CEFTRIAXONE <=0.25 SENSITIVE Sensitive     CIPROFLOXACIN <=0.25 SENSITIVE Sensitive     GENTAMICIN <=1 SENSITIVE Sensitive     IMIPENEM <=0.25 SENSITIVE Sensitive     NITROFURANTOIN 64 INTERMEDIATE Intermediate     TRIMETH/SULFA <=20 SENSITIVE Sensitive     AMPICILLIN/SULBACTAM 4 SENSITIVE Sensitive     PIP/TAZO <=4 SENSITIVE Sensitive     * 40,000 COLONIES/mL KLEBSIELLA PNEUMONIAE  No results found.     Medical Problem List and Plan: 1. Functional deficits secondary to IPH in the left parasagittal frontal parietal lobes likely secondary to uncontrolled hypertension  -patient may shower  -ELOS/Goals: MinA 12-16 days  Admit to CIR 2.  Antithrombotics: -DVT/anticoagulation:  Mechanical:  Antiembolism stockings, knee (TED hose) Bilateral lower extremities  -antiplatelet therapy: N/A 3. Pain Management: Tylenol as needed 4. Mood: Provide emotional support  -antipsychotic agents: N/A 5. Neuropsych: This patient is capable of making decisions on her own behalf. 6. Skin/Wound Care: Routine skin checks 7. Fluids/Electrolytes/Nutrition: Routine in and outs with follow-up chemistries 8.  Hypertension.  Uncontrolled. Start magnesium gluconate 250mg  HS and check magnesium level  tomorrow morning. Patient initially on Cleviprex.  Monitor with increased mobility 9.  Gram-negative rod UTI.  Completing course of Keflex. Add probiotic 10.  GERD.  Protonix 11.  Hyperlipidemia.  Rosuvastatin 5 mg daily resume at discharge. 12.  Obesity.  BMI 42.05.  Dietary follow-up 13. Constipation: start magnesium gluconate 250mg  HS.   I have personally performed a face to face diagnostic evaluation, including, but not limited to relevant history and physical exam findings, of this patient and developed relevant assessment and plan.  Additionally, I have reviewed and concur with the physician assistant's documentation above.  Izora Ribas, MD 05/31/2021   Lavon Paganini Angiulli, PA-C

## 2021-05-31 NOTE — Progress Notes (Signed)
Inpatient Rehabilitation Admissions Coordinator   I have insurance approval and can admit to CIR today I met with patient and her daughter at bedside and they are in agreement. I have notified acute team and TOC. I will make the arrangements to admit today.  Danne Baxter, RN, MSN Rehab Admissions Coordinator [phone] 05/31/2021 8:59 AM

## 2021-05-31 NOTE — Progress Notes (Signed)
Patient ID: Terri Werner, female   DOB: [birth year], 71 y.o.   MRN: [id number] Met with the patient and daughter to introduce self, role, team conference, rehab process and plan of care. Reviewed current situation, medications, diet and management of secondary risks including HTN, HLD (LDL 101) and prediabetes along with treatment for UTI. Continue to follow along to discharge to address educational needs and facilitate preparation for discharge. Margarito Liner

## 2021-05-31 NOTE — Discharge Summary (Signed)
is normal. Chronic small vessel ischemic type change in the hemispheric white matter. Partially empty sella, nonspecific in isolation. Vascular: Normal flow voids and vascular enhancements Skull and upper cervical spine: Normal marrow signal Sinuses/Orbits: Negative IMPRESSION: No mass or infarct seen underlying the left posterior frontal hematoma. There are however signs of innumerable remote lobar microhemorrhages and white matter gliosis, compatible with amyloid angiopathy or other primary vascular disease. Electronically Signed   By: Jorje Guild M.D.   On: 05/29/2021 10:37   EEG adult  Result Date: 05/28/2021 Tsosie Billing, MD     05/28/2021  7:59 PM [facility] Routine EEG Report Duration: 21 min Patient Name:   Terri Werner Date of Birth:   [birth year] Identification Number:   MRN - [id number] Date of Study:   05/28/2021 08:11:10 Indication: Spells, Eval for Seizures, Technical Summary: A routine 20 channel  electroencephalogram using the international 10-20 system of electrode placement was performed. Background: 9-10 Hz, Posterior dominant rhythm that attenuated with eye Opening States      Awake Activation Procedures Hyperventilation: Performed : Photic Stimulation: Classification: Normal : There were no Epileptiform discharges Diagnosis: Normal Awake study. There are no epileptiform discharges. Clinical Correlation: This is a normal study. The absence of interictal epileptiform abnormalities does not exclude the diagnosis of a seizure disorder. Dr Tsosie Billing [facility] [phone] Case [id number]  ECHOCARDIOGRAM COMPLETE  Result Date: 05/28/2021    ECHOCARDIOGRAM REPORT   Patient Name:   Terri Werner Date of Exam: 05/28/2021 Medical Rec #:  [id number]                   Height:       62.0 in Accession #:    [id number]                  Weight:       229.9 lb Date of Birth:  10-Jan-1951                   BSA:          2.028 m Patient Age:    71 years                    BP:           116/61 mmHg Patient Gender: F                           HR:           60 bpm. Exam Location:  Inpatient Procedure: 2D Echo Indications:    CVA  History:        Patient has no prior history of Echocardiogram examinations.                 Signs/Symptoms:Murmur; Risk Factors:Hypertension.  Sonographer:    Arlyss Gandy Referring Phys: [place]  1. Left ventricular ejection fraction, by estimation, is 60 to 65%. The left ventricle has normal function. The left ventricle has no regional wall motion abnormalities. There is mild concentric left ventricular hypertrophy. Left ventricular diastolic parameters are consistent with Grade I diastolic dysfunction (impaired relaxation).  2. Right ventricular systolic function is normal. The right ventricular size is normal. There is normal pulmonary artery systolic pressure.  3. The mitral valve is normal in structure. Trivial mitral valve regurgitation.   4. The aortic valve is tricuspid. There is mild calcification of the aortic valve. There is mild  is normal. Chronic small vessel ischemic type change in the hemispheric white matter. Partially empty sella, nonspecific in isolation. Vascular: Normal flow voids and vascular enhancements Skull and upper cervical spine: Normal marrow signal Sinuses/Orbits: Negative IMPRESSION: No mass or infarct seen underlying the left posterior frontal hematoma. There are however signs of innumerable remote lobar microhemorrhages and white matter gliosis, compatible with amyloid angiopathy or other primary vascular disease. Electronically Signed   By: Jorje Guild M.D.   On: 05/29/2021 10:37   EEG adult  Result Date: 05/28/2021 Tsosie Billing, MD     05/28/2021  7:59 PM [facility] Routine EEG Report Duration: 21 min Patient Name:   Terri Werner Date of Birth:   [birth year] Identification Number:   MRN - [id number] Date of Study:   05/28/2021 08:11:10 Indication: Spells, Eval for Seizures, Technical Summary: A routine 20 channel  electroencephalogram using the international 10-20 system of electrode placement was performed. Background: 9-10 Hz, Posterior dominant rhythm that attenuated with eye Opening States      Awake Activation Procedures Hyperventilation: Performed : Photic Stimulation: Classification: Normal : There were no Epileptiform discharges Diagnosis: Normal Awake study. There are no epileptiform discharges. Clinical Correlation: This is a normal study. The absence of interictal epileptiform abnormalities does not exclude the diagnosis of a seizure disorder. Dr Tsosie Billing [facility] [phone] Case [id number]  ECHOCARDIOGRAM COMPLETE  Result Date: 05/28/2021    ECHOCARDIOGRAM REPORT   Patient Name:   Terri Werner Date of Exam: 05/28/2021 Medical Rec #:  [id number]                   Height:       62.0 in Accession #:    [id number]                  Weight:       229.9 lb Date of Birth:  10-Jan-1951                   BSA:          2.028 m Patient Age:    71 years                    BP:           116/61 mmHg Patient Gender: F                           HR:           60 bpm. Exam Location:  Inpatient Procedure: 2D Echo Indications:    CVA  History:        Patient has no prior history of Echocardiogram examinations.                 Signs/Symptoms:Murmur; Risk Factors:Hypertension.  Sonographer:    Arlyss Gandy Referring Phys: [place]  1. Left ventricular ejection fraction, by estimation, is 60 to 65%. The left ventricle has normal function. The left ventricle has no regional wall motion abnormalities. There is mild concentric left ventricular hypertrophy. Left ventricular diastolic parameters are consistent with Grade I diastolic dysfunction (impaired relaxation).  2. Right ventricular systolic function is normal. The right ventricular size is normal. There is normal pulmonary artery systolic pressure.  3. The mitral valve is normal in structure. Trivial mitral valve regurgitation.   4. The aortic valve is tricuspid. There is mild calcification of the aortic valve. There is mild  [facility]  Physician Discharge Summary   Patient ID: MILEE QUALLS MRN: [id number] DOB/AGE: November 18, 1950 71 y.o.  Admit date: 05/27/2021 Discharge date: 05/31/2021    PCP: Steele Sizer, MD  DISCHARGE DIAGNOSES:  Acute stroke with intracranial hemorrhage Essential hypertension Urinary tract infection Hyperlipidemia  PATIENT BEING TRANSFERRED/DISCHARGED TO INPATIENT REHAB  RECOMMENDATIONS FOR OUTPATIENT FOLLOW UP: Patient will need outpatient follow-up with neurology   CODE STATUS: Full code  DISCHARGE CONDITION: fair   INITIAL HISTORY: 71 y.o. female with history of HTN, arthritis, diverticulitis, heart murmur, hep B, and migraines presenting with sudden onset of right leg weakness. She ran out of her medications and had her last dose of amlodipine on Monday. Her prescription ran out and there was some sort of misunderstanding in getting it refilled so she has been taking her Norvasc sporadically instead of daily until she could get it refilled.  She drove herself to church this morning and in the afternoon noted that she could not move her right leg.  She was brought in as an acute code stroke to [facility] hospital where she was found to have a left high frontal ICH.  She did state that she had a colonoscopy Monday and she does have a heart murmur. Last known well 1300 hrs. Head CT showed an IPH in the left parasagittal frontoparietal lobe. At 2230 on 1/14 RN noted right foot twitching every few minutes lasting over 30 minutes, neurohospitalist notified. Concern for possible seizure, she was given 1mg  of ativan, 2g Keppra load. EEG and Echo pending. MRI with contrast performed to rule out mets source.  MRI reveals numerous microhemorrhages, and IPH was likely caused by hypertension.    Consultations: Neurology  Procedures: Transthoracic echocardiogram EEG   HOSPITAL COURSE:   Stroke:   IPH in the left parasagittal frontoparietal lobes likely  secondary uncontrolled hypertensive source vs metastatic source doubt endocarditis given history of colonoscopy earlier last week. Code Stroke acute IPH in the parasagittal left frontoparietal lobes with mild edema.  No substantial mass-effect.  No IVH. Repeat CT- unchanged size of Lt frontal lobe IPH CTA head & neck No hemodynamically significant stenosis. CT venogram negative for dural venous sinus thrombus MRI  No mass or infarct underlying left frontal IPH, innumerable remote lobar microhemorrhages and white matter gliosis compatible with amyloid angiopathy 2D Echo EF 60-65%, mild LVH, grade 1 diastolic dysfunction, normal atrial septum EEG Normal awake study with no epileptiform discharges LDL 101 HgbA1c 5. 4  neurological monitoring and strict blood pressure control with systolic below 945 VTE prophylaxis - SCDs aspirin 81 mg daily prior to admission, now on No antithrombotic r/t IPH    Hypertension Home meds:  Norvasc 2.5mg   Stable BP systolic less than 038   Hyperlipidemia Home meds:  Rosuvastatin 5 mg daily, will resume at discharge  LDL 101, goal < 70 Continue statin at discharge  Urinary tract infection Mentions some urinary frequency.  Urine culture positive for E. coli and Klebsiella.  Symptoms have improved.  Sensitivities revealed that the E. coli is actually resistant to Keflex.  Was discharged on 3 more days of Bactrim.   Obesity Estimated body mass index is 42.05 kg/m as calculated from the following:   Height as of this encounter: 5' 2.01" (1.575 m).   Weight as of this encounter: 104.3 kg.  PATIENT BEING TRANSFERRED/DISCHARGED TO INPATIENT REHAB   PERTINENT LABS:  The results of significant diagnostics from this hospitalization (including imaging, microbiology, ancillary and laboratory) are listed below for  [facility]  Physician Discharge Summary   Patient ID: MILEE QUALLS MRN: [id number] DOB/AGE: November 18, 1950 71 y.o.  Admit date: 05/27/2021 Discharge date: 05/31/2021    PCP: Steele Sizer, MD  DISCHARGE DIAGNOSES:  Acute stroke with intracranial hemorrhage Essential hypertension Urinary tract infection Hyperlipidemia  PATIENT BEING TRANSFERRED/DISCHARGED TO INPATIENT REHAB  RECOMMENDATIONS FOR OUTPATIENT FOLLOW UP: Patient will need outpatient follow-up with neurology   CODE STATUS: Full code  DISCHARGE CONDITION: fair   INITIAL HISTORY: 71 y.o. female with history of HTN, arthritis, diverticulitis, heart murmur, hep B, and migraines presenting with sudden onset of right leg weakness. She ran out of her medications and had her last dose of amlodipine on Monday. Her prescription ran out and there was some sort of misunderstanding in getting it refilled so she has been taking her Norvasc sporadically instead of daily until she could get it refilled.  She drove herself to church this morning and in the afternoon noted that she could not move her right leg.  She was brought in as an acute code stroke to [facility] hospital where she was found to have a left high frontal ICH.  She did state that she had a colonoscopy Monday and she does have a heart murmur. Last known well 1300 hrs. Head CT showed an IPH in the left parasagittal frontoparietal lobe. At 2230 on 1/14 RN noted right foot twitching every few minutes lasting over 30 minutes, neurohospitalist notified. Concern for possible seizure, she was given 1mg  of ativan, 2g Keppra load. EEG and Echo pending. MRI with contrast performed to rule out mets source.  MRI reveals numerous microhemorrhages, and IPH was likely caused by hypertension.    Consultations: Neurology  Procedures: Transthoracic echocardiogram EEG   HOSPITAL COURSE:   Stroke:   IPH in the left parasagittal frontoparietal lobes likely  secondary uncontrolled hypertensive source vs metastatic source doubt endocarditis given history of colonoscopy earlier last week. Code Stroke acute IPH in the parasagittal left frontoparietal lobes with mild edema.  No substantial mass-effect.  No IVH. Repeat CT- unchanged size of Lt frontal lobe IPH CTA head & neck No hemodynamically significant stenosis. CT venogram negative for dural venous sinus thrombus MRI  No mass or infarct underlying left frontal IPH, innumerable remote lobar microhemorrhages and white matter gliosis compatible with amyloid angiopathy 2D Echo EF 60-65%, mild LVH, grade 1 diastolic dysfunction, normal atrial septum EEG Normal awake study with no epileptiform discharges LDL 101 HgbA1c 5. 4  neurological monitoring and strict blood pressure control with systolic below 945 VTE prophylaxis - SCDs aspirin 81 mg daily prior to admission, now on No antithrombotic r/t IPH    Hypertension Home meds:  Norvasc 2.5mg   Stable BP systolic less than 038   Hyperlipidemia Home meds:  Rosuvastatin 5 mg daily, will resume at discharge  LDL 101, goal < 70 Continue statin at discharge  Urinary tract infection Mentions some urinary frequency.  Urine culture positive for E. coli and Klebsiella.  Symptoms have improved.  Sensitivities revealed that the E. coli is actually resistant to Keflex.  Was discharged on 3 more days of Bactrim.   Obesity Estimated body mass index is 42.05 kg/m as calculated from the following:   Height as of this encounter: 5' 2.01" (1.575 m).   Weight as of this encounter: 104.3 kg.  PATIENT BEING TRANSFERRED/DISCHARGED TO INPATIENT REHAB   PERTINENT LABS:  The results of significant diagnostics from this hospitalization (including imaging, microbiology, ancillary and laboratory) are listed below for  [facility]  Physician Discharge Summary   Patient ID: MILEE QUALLS MRN: [id number] DOB/AGE: November 18, 1950 71 y.o.  Admit date: 05/27/2021 Discharge date: 05/31/2021    PCP: Steele Sizer, MD  DISCHARGE DIAGNOSES:  Acute stroke with intracranial hemorrhage Essential hypertension Urinary tract infection Hyperlipidemia  PATIENT BEING TRANSFERRED/DISCHARGED TO INPATIENT REHAB  RECOMMENDATIONS FOR OUTPATIENT FOLLOW UP: Patient will need outpatient follow-up with neurology   CODE STATUS: Full code  DISCHARGE CONDITION: fair   INITIAL HISTORY: 71 y.o. female with history of HTN, arthritis, diverticulitis, heart murmur, hep B, and migraines presenting with sudden onset of right leg weakness. She ran out of her medications and had her last dose of amlodipine on Monday. Her prescription ran out and there was some sort of misunderstanding in getting it refilled so she has been taking her Norvasc sporadically instead of daily until she could get it refilled.  She drove herself to church this morning and in the afternoon noted that she could not move her right leg.  She was brought in as an acute code stroke to [facility] hospital where she was found to have a left high frontal ICH.  She did state that she had a colonoscopy Monday and she does have a heart murmur. Last known well 1300 hrs. Head CT showed an IPH in the left parasagittal frontoparietal lobe. At 2230 on 1/14 RN noted right foot twitching every few minutes lasting over 30 minutes, neurohospitalist notified. Concern for possible seizure, she was given 1mg  of ativan, 2g Keppra load. EEG and Echo pending. MRI with contrast performed to rule out mets source.  MRI reveals numerous microhemorrhages, and IPH was likely caused by hypertension.    Consultations: Neurology  Procedures: Transthoracic echocardiogram EEG   HOSPITAL COURSE:   Stroke:   IPH in the left parasagittal frontoparietal lobes likely  secondary uncontrolled hypertensive source vs metastatic source doubt endocarditis given history of colonoscopy earlier last week. Code Stroke acute IPH in the parasagittal left frontoparietal lobes with mild edema.  No substantial mass-effect.  No IVH. Repeat CT- unchanged size of Lt frontal lobe IPH CTA head & neck No hemodynamically significant stenosis. CT venogram negative for dural venous sinus thrombus MRI  No mass or infarct underlying left frontal IPH, innumerable remote lobar microhemorrhages and white matter gliosis compatible with amyloid angiopathy 2D Echo EF 60-65%, mild LVH, grade 1 diastolic dysfunction, normal atrial septum EEG Normal awake study with no epileptiform discharges LDL 101 HgbA1c 5. 4  neurological monitoring and strict blood pressure control with systolic below 945 VTE prophylaxis - SCDs aspirin 81 mg daily prior to admission, now on No antithrombotic r/t IPH    Hypertension Home meds:  Norvasc 2.5mg   Stable BP systolic less than 038   Hyperlipidemia Home meds:  Rosuvastatin 5 mg daily, will resume at discharge  LDL 101, goal < 70 Continue statin at discharge  Urinary tract infection Mentions some urinary frequency.  Urine culture positive for E. coli and Klebsiella.  Symptoms have improved.  Sensitivities revealed that the E. coli is actually resistant to Keflex.  Was discharged on 3 more days of Bactrim.   Obesity Estimated body mass index is 42.05 kg/m as calculated from the following:   Height as of this encounter: 5' 2.01" (1.575 m).   Weight as of this encounter: 104.3 kg.  PATIENT BEING TRANSFERRED/DISCHARGED TO INPATIENT REHAB   PERTINENT LABS:  The results of significant diagnostics from this hospitalization (including imaging, microbiology, ancillary and laboratory) are listed below for  [facility]  Physician Discharge Summary   Patient ID: MILEE QUALLS MRN: [id number] DOB/AGE: November 18, 1950 71 y.o.  Admit date: 05/27/2021 Discharge date: 05/31/2021    PCP: Steele Sizer, MD  DISCHARGE DIAGNOSES:  Acute stroke with intracranial hemorrhage Essential hypertension Urinary tract infection Hyperlipidemia  PATIENT BEING TRANSFERRED/DISCHARGED TO INPATIENT REHAB  RECOMMENDATIONS FOR OUTPATIENT FOLLOW UP: Patient will need outpatient follow-up with neurology   CODE STATUS: Full code  DISCHARGE CONDITION: fair   INITIAL HISTORY: 71 y.o. female with history of HTN, arthritis, diverticulitis, heart murmur, hep B, and migraines presenting with sudden onset of right leg weakness. She ran out of her medications and had her last dose of amlodipine on Monday. Her prescription ran out and there was some sort of misunderstanding in getting it refilled so she has been taking her Norvasc sporadically instead of daily until she could get it refilled.  She drove herself to church this morning and in the afternoon noted that she could not move her right leg.  She was brought in as an acute code stroke to [facility] hospital where she was found to have a left high frontal ICH.  She did state that she had a colonoscopy Monday and she does have a heart murmur. Last known well 1300 hrs. Head CT showed an IPH in the left parasagittal frontoparietal lobe. At 2230 on 1/14 RN noted right foot twitching every few minutes lasting over 30 minutes, neurohospitalist notified. Concern for possible seizure, she was given 1mg  of ativan, 2g Keppra load. EEG and Echo pending. MRI with contrast performed to rule out mets source.  MRI reveals numerous microhemorrhages, and IPH was likely caused by hypertension.    Consultations: Neurology  Procedures: Transthoracic echocardiogram EEG   HOSPITAL COURSE:   Stroke:   IPH in the left parasagittal frontoparietal lobes likely  secondary uncontrolled hypertensive source vs metastatic source doubt endocarditis given history of colonoscopy earlier last week. Code Stroke acute IPH in the parasagittal left frontoparietal lobes with mild edema.  No substantial mass-effect.  No IVH. Repeat CT- unchanged size of Lt frontal lobe IPH CTA head & neck No hemodynamically significant stenosis. CT venogram negative for dural venous sinus thrombus MRI  No mass or infarct underlying left frontal IPH, innumerable remote lobar microhemorrhages and white matter gliosis compatible with amyloid angiopathy 2D Echo EF 60-65%, mild LVH, grade 1 diastolic dysfunction, normal atrial septum EEG Normal awake study with no epileptiform discharges LDL 101 HgbA1c 5. 4  neurological monitoring and strict blood pressure control with systolic below 945 VTE prophylaxis - SCDs aspirin 81 mg daily prior to admission, now on No antithrombotic r/t IPH    Hypertension Home meds:  Norvasc 2.5mg   Stable BP systolic less than 038   Hyperlipidemia Home meds:  Rosuvastatin 5 mg daily, will resume at discharge  LDL 101, goal < 70 Continue statin at discharge  Urinary tract infection Mentions some urinary frequency.  Urine culture positive for E. coli and Klebsiella.  Symptoms have improved.  Sensitivities revealed that the E. coli is actually resistant to Keflex.  Was discharged on 3 more days of Bactrim.   Obesity Estimated body mass index is 42.05 kg/m as calculated from the following:   Height as of this encounter: 5' 2.01" (1.575 m).   Weight as of this encounter: 104.3 kg.  PATIENT BEING TRANSFERRED/DISCHARGED TO INPATIENT REHAB   PERTINENT LABS:  The results of significant diagnostics from this hospitalization (including imaging, microbiology, ancillary and laboratory) are listed below for  is normal. Chronic small vessel ischemic type change in the hemispheric white matter. Partially empty sella, nonspecific in isolation. Vascular: Normal flow voids and vascular enhancements Skull and upper cervical spine: Normal marrow signal Sinuses/Orbits: Negative IMPRESSION: No mass or infarct seen underlying the left posterior frontal hematoma. There are however signs of innumerable remote lobar microhemorrhages and white matter gliosis, compatible with amyloid angiopathy or other primary vascular disease. Electronically Signed   By: Jorje Guild M.D.   On: 05/29/2021 10:37   EEG adult  Result Date: 05/28/2021 Tsosie Billing, MD     05/28/2021  7:59 PM [facility] Routine EEG Report Duration: 21 min Patient Name:   Terri Werner Date of Birth:   [birth year] Identification Number:   MRN - [id number] Date of Study:   05/28/2021 08:11:10 Indication: Spells, Eval for Seizures, Technical Summary: A routine 20 channel  electroencephalogram using the international 10-20 system of electrode placement was performed. Background: 9-10 Hz, Posterior dominant rhythm that attenuated with eye Opening States      Awake Activation Procedures Hyperventilation: Performed : Photic Stimulation: Classification: Normal : There were no Epileptiform discharges Diagnosis: Normal Awake study. There are no epileptiform discharges. Clinical Correlation: This is a normal study. The absence of interictal epileptiform abnormalities does not exclude the diagnosis of a seizure disorder. Dr Tsosie Billing [facility] [phone] Case [id number]  ECHOCARDIOGRAM COMPLETE  Result Date: 05/28/2021    ECHOCARDIOGRAM REPORT   Patient Name:   Terri Werner Date of Exam: 05/28/2021 Medical Rec #:  [id number]                   Height:       62.0 in Accession #:    [id number]                  Weight:       229.9 lb Date of Birth:  10-Jan-1951                   BSA:          2.028 m Patient Age:    71 years                    BP:           116/61 mmHg Patient Gender: F                           HR:           60 bpm. Exam Location:  Inpatient Procedure: 2D Echo Indications:    CVA  History:        Patient has no prior history of Echocardiogram examinations.                 Signs/Symptoms:Murmur; Risk Factors:Hypertension.  Sonographer:    Arlyss Gandy Referring Phys: [place]  1. Left ventricular ejection fraction, by estimation, is 60 to 65%. The left ventricle has normal function. The left ventricle has no regional wall motion abnormalities. There is mild concentric left ventricular hypertrophy. Left ventricular diastolic parameters are consistent with Grade I diastolic dysfunction (impaired relaxation).  2. Right ventricular systolic function is normal. The right ventricular size is normal. There is normal pulmonary artery systolic pressure.  3. The mitral valve is normal in structure. Trivial mitral valve regurgitation.   4. The aortic valve is tricuspid. There is mild calcification of the aortic valve. There is mild  is normal. Chronic small vessel ischemic type change in the hemispheric white matter. Partially empty sella, nonspecific in isolation. Vascular: Normal flow voids and vascular enhancements Skull and upper cervical spine: Normal marrow signal Sinuses/Orbits: Negative IMPRESSION: No mass or infarct seen underlying the left posterior frontal hematoma. There are however signs of innumerable remote lobar microhemorrhages and white matter gliosis, compatible with amyloid angiopathy or other primary vascular disease. Electronically Signed   By: Jorje Guild M.D.   On: 05/29/2021 10:37   EEG adult  Result Date: 05/28/2021 Tsosie Billing, MD     05/28/2021  7:59 PM [facility] Routine EEG Report Duration: 21 min Patient Name:   Terri Werner Date of Birth:   [birth year] Identification Number:   MRN - [id number] Date of Study:   05/28/2021 08:11:10 Indication: Spells, Eval for Seizures, Technical Summary: A routine 20 channel  electroencephalogram using the international 10-20 system of electrode placement was performed. Background: 9-10 Hz, Posterior dominant rhythm that attenuated with eye Opening States      Awake Activation Procedures Hyperventilation: Performed : Photic Stimulation: Classification: Normal : There were no Epileptiform discharges Diagnosis: Normal Awake study. There are no epileptiform discharges. Clinical Correlation: This is a normal study. The absence of interictal epileptiform abnormalities does not exclude the diagnosis of a seizure disorder. Dr Tsosie Billing [facility] [phone] Case [id number]  ECHOCARDIOGRAM COMPLETE  Result Date: 05/28/2021    ECHOCARDIOGRAM REPORT   Patient Name:   Terri Werner Date of Exam: 05/28/2021 Medical Rec #:  [id number]                   Height:       62.0 in Accession #:    [id number]                  Weight:       229.9 lb Date of Birth:  10-Jan-1951                   BSA:          2.028 m Patient Age:    71 years                    BP:           116/61 mmHg Patient Gender: F                           HR:           60 bpm. Exam Location:  Inpatient Procedure: 2D Echo Indications:    CVA  History:        Patient has no prior history of Echocardiogram examinations.                 Signs/Symptoms:Murmur; Risk Factors:Hypertension.  Sonographer:    Arlyss Gandy Referring Phys: [place]  1. Left ventricular ejection fraction, by estimation, is 60 to 65%. The left ventricle has normal function. The left ventricle has no regional wall motion abnormalities. There is mild concentric left ventricular hypertrophy. Left ventricular diastolic parameters are consistent with Grade I diastolic dysfunction (impaired relaxation).  2. Right ventricular systolic function is normal. The right ventricular size is normal. There is normal pulmonary artery systolic pressure.  3. The mitral valve is normal in structure. Trivial mitral valve regurgitation.   4. The aortic valve is tricuspid. There is mild calcification of the aortic valve. There is mild

## 2021-05-31 NOTE — TOC Transition Note (Signed)
Transition of Care [facility]) - CM/SW Discharge Note   Patient Details  Name: Terri Werner MRN: [id number] Date of Birth: 05-Mar-1951  Transition of Care [facility] CM/SW Contact:  Pollie Friar, RN Phone Number: 05/31/2021, 11:22 AM   Clinical Narrative:    Patient is discharging to CIR today. CM signing off.    Final next level of care: IP Rehab Facility Barriers to Discharge: No Barriers Identified   Patient Goals and CMS Choice     Choice offered to / list presented to : Patient  Discharge Placement                       Discharge Plan and Services                                     Social Determinants of Health (SDOH) Interventions     Readmission Risk Interventions No flowsheet data found.

## 2021-06-01 ENCOUNTER — Telehealth: Payer: Self-pay | Admitting: Family Medicine

## 2021-06-01 DIAGNOSIS — I619 Nontraumatic intracerebral hemorrhage, unspecified: Secondary | ICD-10-CM | POA: Diagnosis not present

## 2021-06-01 LAB — CBC WITH DIFFERENTIAL/PLATELET
Abs Immature Granulocytes: 0.01 10*3/uL (ref 0.00–0.07)
Basophils Absolute: 0 10*3/uL (ref 0.0–0.1)
Basophils Relative: 0 %
Eosinophils Absolute: 0.2 10*3/uL (ref 0.0–0.5)
Eosinophils Relative: 4 %
HCT: 37.4 % (ref 36.0–46.0)
Hemoglobin: 12.1 g/dL (ref 12.0–15.0)
Immature Granulocytes: 0 %
Lymphocytes Relative: 29 %
Lymphs Abs: 1.6 10*3/uL (ref 0.7–4.0)
MCH: 28.1 pg (ref 26.0–34.0)
MCHC: 32.4 g/dL (ref 30.0–36.0)
MCV: 87 fL (ref 80.0–100.0)
Monocytes Absolute: 0.6 10*3/uL (ref 0.1–1.0)
Monocytes Relative: 10 %
Neutro Abs: 3.1 10*3/uL (ref 1.7–7.7)
Neutrophils Relative %: 57 %
Platelets: 181 10*3/uL (ref 150–400)
RBC: 4.3 MIL/uL (ref 3.87–5.11)
RDW: 15.3 % (ref 11.5–15.5)
WBC: 5.5 10*3/uL (ref 4.0–10.5)
nRBC: 0 % (ref 0.0–0.2)

## 2021-06-01 LAB — COMPREHENSIVE METABOLIC PANEL
ALT: 13 U/L (ref 0–44)
AST: 16 U/L (ref 15–41)
Albumin: 3.2 g/dL — ABNORMAL LOW (ref 3.5–5.0)
Alkaline Phosphatase: 40 U/L (ref 38–126)
Anion gap: 13 (ref 5–15)
BUN: 16 mg/dL (ref 8–23)
CO2: 23 mmol/L (ref 22–32)
Calcium: 9.3 mg/dL (ref 8.9–10.3)
Chloride: 107 mmol/L (ref 98–111)
Creatinine, Ser: 1.12 mg/dL — ABNORMAL HIGH (ref 0.44–1.00)
GFR, Estimated: 53 mL/min — ABNORMAL LOW (ref >60)
Glucose, Bld: 127 mg/dL — ABNORMAL HIGH (ref 70–99)
Potassium: 3.6 mmol/L (ref 3.5–5.1)
Sodium: 143 mmol/L (ref 135–145)
Total Bilirubin: 0.5 mg/dL (ref 0.3–1.2)
Total Protein: 6.7 g/dL (ref 6.5–8.1)

## 2021-06-01 LAB — MAGNESIUM: Magnesium: 2.2 mg/dL (ref 1.7–2.4)

## 2021-06-01 LAB — LIPID PANEL
Cholesterol: 158 mg/dL (ref 0–200)
HDL: 50 mg/dL (ref >40)
LDL Cholesterol: 96 mg/dL (ref 0–99)
Total CHOL/HDL Ratio: 3.2 RATIO
Triglycerides: 60 mg/dL (ref <150)
VLDL: 12 mg/dL (ref 0–40)

## 2021-06-01 MED ORDER — AMLODIPINE BESYLATE 2.5 MG PO TABS
2.5000 mg | ORAL_TABLET | Freq: Every day | ORAL | Status: DC
  Administered 2021-06-01 – 2021-06-03 (×3): 2.5 mg via ORAL
  Filled 2021-06-01 (×3): qty 1

## 2021-06-01 NOTE — Evaluation (Signed)
Occupational Therapy Assessment and Plan  Patient Details  Name: Terri Werner MRN: [id number] Date of Birth: 1951-04-24  OT Diagnosis: apraxia, cognitive deficits, and hemiplegia affecting dominant side Rehab Potential: Rehab Potential (ACUTE ONLY): Good ELOS: 2-3 weeks   Today's Date: 06/01/2021 OT Individual Time: 9563-8756 OT Individual Time Calculation (min): 75 min     Hospital Problem: Principal Problem:   Intraparenchymal hemorrhage of brain (HCC)   Past Medical History:  Past Medical History:  Diagnosis Date   Arthritis    pt reports in back & knees   Colon polyps 2010   At [facility]   Diverticulitis 2016   Genital warts    GERD (gastroesophageal reflux disease)    Heart murmur    Hepatitis B    History of blood transfusion    during each major surgery per pt   History of chicken pox    History of hiatal hernia    History of torn meniscus of right knee    HTN (hypertension)    Hx of migraine headaches    Hypertension    Hypopotassemia    Past Surgical History:  Past Surgical History:  Procedure Laterality Date   ABDOMINAL HYSTERECTOMY  2006   APPENDECTOMY  1980   CESAREAN SECTION  1981   COLONOSCOPY WITH PROPOFOL N/A 11/01/2014   Procedure: COLONOSCOPY WITH PROPOFOL;  Surgeon: Scot Jun, MD;  Location[facility] ENDOSCOPY;  Service: Endoscopy;  Laterality: N/A;   COLONOSCOPY WITH PROPOFOL N/A 01/05/2020   Procedure: COLONOSCOPY WITH PROPOFOL;  Surgeon: Regis Bill, MD;  Location: [facility] ENDOSCOPY;  Service: Endoscopy;  Laterality: N/A;   ECTOPIC PREGNANCY SURGERY  1980   HERNIA REPAIR  2006   Umbilical   KNEE ARTHROSCOPY Left 2006   torn meniscus   TONSILLECTOMY  1971   TOTAL ABDOMINAL HYSTERECTOMY W/ BILATERAL SALPINGOOPHORECTOMY  2006   VESICOVAGINAL FISTULA CLOSURE W/ TAH      Assessment & Plan Clinical Impression: Patient is a 71 y.o. year old female with history of hepatitis B, hyperlipidemia, hypertension but ran out of her  medications and did not seek a refill.  Per chart review patient lives alone.  1 level home ramped entrance.  Recently retired in November.  She has a daughter and niece in the area.  Presented 05/27/2021 with sudden onset of right leg weakness and mild headache.  Cranial CT scan showed acute parenchymal hemorrhage in the parasagittal left frontal parietal lobes with mild edema.  No substantial mass-effect.  CT angiogram head and neck no abnormal vascularity in the region of hemorrhage no hemodynamically significant stenosis.  MRI follow-up showed no mass or infarct seen underlying the left posterior frontal hematoma.  Admission chemistries were unremarkable except glucose 103, urine drug screen negative, urine culture greater 100,000 gram-negative rods.  Patient was placed on Keflex x5 days for UTI.  EEG showed no seizure activity.  Echocardiogram with ejection fraction of 60 to 65% no wall motion abnormalities grade 1 diastolic dysfunction.  Neurology follow-up IPH likely caused by hypertension monitored closely and she was initially maintained on Cleviprex.  Tolerating a regular consistency diet.    Patient transferred to CIR on 05/31/2021 .    Patient currently requires total with basic self-care skills and functional mobility  secondary to muscle weakness, decreased cardiorespiratoy endurance, impaired timing and sequencing, abnormal tone, unbalanced muscle activation, motor apraxia, decreased coordination, and decreased motor planning, decreased attention to right and decreased motor planning, decreased attention, decreased awareness, and delayed processing, and decreased sitting  balance, decreased standing balance, decreased postural control, hemiplegia, decreased balance strategies, and difficulty maintaining precautions.  Prior to hospitalization, patient could complete BADL related task with independent .  Patient will benefit from skilled intervention to decrease level of assist with basic self-care  skills and increase independence with basic self-care skills prior to discharge home with care partner.  Anticipate patient will require minimal physical assistance and follow up home health.  OT - End of Session Activity Tolerance: Tolerates 30+ min activity with multiple rests Endurance Deficit: Yes OT Assessment Rehab Potential (ACUTE ONLY): Good OT Patient demonstrates impairments in the following area(s): Balance;Endurance;Motor;Safety;Cognition;Perception;Sensory;Pain OT Basic ADL's Functional Problem(s): Grooming;Bathing;Dressing;Eating;Toileting OT Transfers Functional Problem(s): Toilet;Tub/Shower OT Additional Impairment(s): Fuctional Use of Upper Extremity OT Plan OT Intensity: Minimum of 1-2 x/day, 45 to 90 minutes OT Frequency: 5 out of 7 days OT Duration/Estimated Length of Stay: 2-3 weeks OT Treatment/Interventions: Balance/vestibular training;Neuromuscular re-education;Self Care/advanced ADL retraining;Patient/family education;Community reintegration;Therapeutic Exercise;UE/LE Coordination activities;Wheelchair propulsion/positioning;Cognitive remediation/compensation;Discharge planning;Functional mobility training;DME/adaptive equipment instruction;Pain management;Psychosocial support;Therapeutic Activities;UE/LE Strength taining/ROM;Visual/perceptual remediation/compensation OT Self Feeding Anticipated Outcome(s): set-up OT Basic Self-Care Anticipated Outcome(s): MinA OT Toileting Anticipated Outcome(s): MinA OT Bathroom Transfers Anticipated Outcome(s): MinA OT Recommendation Recommendations for Other Services: Therapeutic Recreation consult Therapeutic Recreation Interventions: Outing/community reintergration Patient destination: Home Follow Up Recommendations: Home health OT Equipment Recommended: To be determined   OT Evaluation Precautions/Restrictions  Precautions Precautions: Fall Precaution Comments: R hemiparesis, extensor synergy dynamically  RLE Restrictions Weight Bearing Restrictions: No General Chart Reviewed: Yes Family/Caregiver Present: No Vital Signs Therapy Vitals Temp: 98.4 F (36.9 C) Pulse Rate: 72 Resp: 18 BP: (!) 151/78 Patient Position (if appropriate): Sitting Oxygen Therapy SpO2: 100 % O2 Device: Room Air Pain  The pt indicated at the closure of OT eval that she was experiencing low back pain, nursing was informed. Home Living/Prior Functioning Home Living Family/patient expects to be discharged to:: Private residence Living Arrangements: Alone Available Help at Discharge: Family Type of Home: House Home Access: Ramped entrance Home Layout: One level Bathroom Shower/Tub: Hydrographic surveyor, Walk-in shower  Lives With: Alone IADL History Education: Education administrator x2 Prior Function Vocation: Retired Administrator, sports Baseline Vision/History: 1 Wears glasses Ability to See in Adequate Light: 0 Adequate Patient Visual Report: No change from baseline Vision Assessment?: Yes Eye Alignment: Within Functional Limits Additional Comments: OT to continue assessing for visul impairment Perception  Perception: Impaired Inattention/Neglect: Does not attend to right side of body Praxis Praxis: Impaired Praxis Impairment Details: Motor planning Cognition Overall Cognitive Status: Within Functional Limits for tasks assessed Arousal/Alertness: Awake/alert Orientation Level: Person;Place;Situation Person: Oriented Place: Oriented Situation: Oriented Year: 2023 Month: January Day of Week: Correct Memory: Appears intact Immediate Memory Recall: Sock;Blue;Bed Memory Recall Sock: With Cue Memory Recall Blue: Without Cue Memory Recall Bed: Without Cue Attention: Selective Focused Attention: Appears intact Sustained Attention: Appears intact Selective Attention: Impaired Selective Attention Impairment: Functional complex Awareness: Appears intact Awareness Impairment: Anticipatory impairment Problem Solving:  Appears intact Safety/Judgment: Appears intact Sensation Sensation Light Touch: Impaired Detail Light Touch Impaired Details: Impaired RUE;Impaired RLE Hot/Cold: Appears Intact Proprioception: Impaired Detail Proprioception Impaired Details: Impaired RUE;Impaired RLE Coordination Gross Motor Movements are Fluid and Coordinated: No Fine Motor Movements are Fluid and Coordinated: No Coordination and Movement Description: Patient met with challenges in relation to regulating the level of pressure with grasping and releasing an object. Finger Nose Finger Test: not intact at this time. Motor  Motor Motor: Motor apraxia;Abnormal tone;Hemiplegia;Motor impersistence  Trunk/Postural Assessment  Cervical Assessment Cervical Assessment: Exceptions to [facility] (Patient presents with  a forward head posture) Thoracic Assessment Thoracic Assessment: Exceptions to [facility] (Patient present with an anterior tilt at the thoracic level) Lumbar Assessment Lumbar Assessment: Exceptions to [facility] (Patient positions self in a posterior sitting position, sacral sitting more favored.) Postural Control Postural Control: Deficits on evaluation Trunk Control: Patient requires MinA secondary to preference for posterior sitting to the right. Righting Reactions: pt presents with delayed righting response. Protective Responses: The pt presents with poor protective response seondary to and inability to control dynamic sit balance.  Balance Balance Balance Assessed: Yes Static Sitting Balance Static Sitting - Balance Support: Feet supported Static Sitting - Level of Assistance: 5: Stand by assistance Dynamic Sitting Balance Dynamic Sitting - Level of Assistance: 4: Min assist;3: Mod assist Static Standing Balance Static Standing - Balance Support: During functional activity;No upper extremity supported Static Standing - Level of Assistance: 1: +2 Total assist Dynamic Standing Balance Dynamic Standing - Balance Support: No  upper extremity supported;During functional activity Dynamic Standing - Level of Assistance: 1: +2 Total assist Extremity/Trunk Assessment RUE Assessment RUE Assessment: Exceptions to [facility] General Strength Comments: Patient presents with MMT of 3/5 RUE Body System: Neuro Brunstrum levels for arm and hand: Arm;Hand Brunstrum level for arm: Stage IV Movement is deviating from synergy Brunstrum level for hand: Stage IV Movements deviating from synergies RUE Tone RUE Tone: Within Functional Limits LUE Assessment LUE Assessment: Within Functional Limits  Care Tool Care Tool Self Care Eating   Eating Assist Level: Minimal Assistance - Patient > 75%    Oral Care    Oral Care Assist Level: Minimal Assistance - Patient > 75%    Bathing   Body parts bathed by patient: Chest;Abdomen;Right upper leg;Left upper leg;Face;Left arm;Right arm Body parts bathed by helper: Front perineal area;Buttocks;Left lower leg;Right lower leg        Upper Body Dressing(including orthotics)   What is the patient wearing?: Bra;Pull over shirt   Assist Level: Maximal Assistance - Patient 25 - 49%    Lower Body Dressing (excluding footwear)   What is the patient wearing?: Incontinence brief;Pants Assist for lower body dressing: 2 Helpers    Putting on/Taking off footwear   What is the patient wearing?: Socks;Shoes;Ted hose Assist for footwear: Total Assistance - Patient < 25%       Care Tool Toileting Toileting activity   Assist for toileting: 2 Helpers     Care Tool Bed Mobility Roll left and right activity        Sit to lying activity        Lying to sitting on side of bed activity         Care Tool Transfers Sit to stand transfer   Sit to stand assist level: 2 Helpers    Chair/bed transfer   Chair/bed transfer assist level: 2 Helpers     Toilet transfer   Assist Level: 2 Helpers     Care Tool Cognition  Expression of Ideas and Wants Expression of Ideas and Wants: 4. Without  difficulty (complex and basic) - expresses complex messages without difficulty and with speech that is clear and easy to understand  Understanding Verbal and Non-Verbal Content Understanding Verbal and Non-Verbal Content: 4. Understands (complex and basic) - clear comprehension without cues or repetitions   Memory/Recall Ability Memory/Recall Ability : Current season;Staff names and faces;That he or she is in a hospital/hospital unit   Refer to Care Plan for Long Term Goals  SHORT TERM GOAL WEEK 1 OT Short Term Goal 1 (Week  1): Donn UB clothing items with ModA OT Short Term Goal 2 (Week 1): Patient to complete sit to stand with functional task performance with ModA OT Short Term Goal 3 (Week 1): Patient will complete squat-pivot transfer with MaxAx1 person OT Short Term Goal 4 (Week 1): Pt will attend to RUE in relation to placement during functional task performance  Recommendations for other services: Therapeutic Recreation  Outing/community reintegration   Skilled Therapeutic Intervention  OT eval intiated this date with purpose, roles, and goals discussed. The pt completed  was able to come from supine in bed to EOB with initial cueing for managing BLE with ModA.  The pt was able to come from EOB to w/c LOF  using a stand pivot transfer with MaxA x2 for secondary to extensor tone of there R leg and foot.  The pt required  a similar level of assistance for toilet transfer with vc's for incorporating the grab bar due to challenges in perception in and motor planning in relation to RUE/LE while attempting to lower herself to the commode.  The pt was required MaxA for  doffing LB clothing items.  The pt was MaxA +2 for transferring to the shower bench secondary to the same precipitant .  The pt was instructed in ways to modify bathing LB and other area out of reach by including  AE for safety and greater independence with relaxation breathing and rest breaks as a key component. The pt was able to  dress herself UB/LB with verbal prompts bilateral hand coordination and manipulation, she required Mod A for UB and Max A for LB.  The pt was able to brush her teeth with MinA secondary to being right hand dominate and presenting with a degree of weakness  impacting how she approach her environment.  The pt presents with good motivation and has agreed to the established treatment plan of care.  ADL ADL Eating: Minimal assistance (simulated task performance) Grooming: Minimal assistance Where Assessed-Grooming: Chair Upper Body Bathing: Minimal assistance Where Assessed-Upper Body Bathing: Shower Lower Body Bathing: Maximal assistance Where Assessed-Lower Body Bathing: Shower Upper Body Dressing: Moderate assistance Where Assessed-Upper Body Dressing: Wheelchair Lower Body Dressing: Maximal assistance Where Assessed-Lower Body Dressing: Wheelchair Toileting: Dependent Where Assessed-Toileting: Teacher, adult education: Dependent Statistician Method: Surveyor, minerals: Engineer, technical sales: Dependent Web designer Method: Engineer, technical sales: Restaurant manager, fast food: Dependent Film/video editor Method: Warden/ranger: Transfer tub bench;Grab bars Mobility  Bed Mobility Bed Mobility: Supine to Sit Supine to Sit: Maximal Assistance - Patient - Patient 25-49% Transfers Sit to Stand: 2 Helpers Stand to Sit: 2 Helpers   Discharge Criteria: Patient will be discharged from OT if patient refuses treatment 3 consecutive times without medical reason, if treatment goals not met, if there is a change in medical status, if patient makes no progress towards goals or if patient is discharged from hospital.  The above assessment, treatment plan, treatment alternatives and goals were discussed and mutually agreed upon: by patient  Lavona Mound 06/01/2021, 1:19 PM

## 2021-06-01 NOTE — Progress Notes (Signed)
[facility] Individual Statement of Services  Patient Name:  Terri Werner  Date:  06/01/2021  Welcome to the [facility].  Our goal is to provide you with an individualized program based on your diagnosis and situation, designed to meet your specific needs.  With this comprehensive rehabilitation program, you will be expected to participate in at least 3 hours of rehabilitation therapies Monday-Friday, with modified therapy programming on the weekends.  Your rehabilitation program will include the following services:  Physical Therapy (PT), Occupational Therapy (OT), Speech Therapy (ST), 24 hour per day rehabilitation nursing, Therapeutic Recreaction (TR), Neuropsychology, Care Coordinator, Rehabilitation Medicine, Nutrition Services, Pharmacy Services, and Other  Weekly team conferences will be held on Wednesdays to discuss your progress.  Your Inpatient Rehabilitation Care Coordinator will talk with you frequently to get your input and to update you on team discussions.  Team conferences with you and your family in attendance may also be held.  Expected length of stay:  14-20 Days  Overall anticipated outcome:  Min A  Depending on your progress and recovery, your program may change. Your Inpatient Rehabilitation Care Coordinator will coordinate services and will keep you informed of any changes. Your Inpatient Rehabilitation Care Coordinator's name and contact numbers are listed  below.  The following services may also be recommended but are not provided by the [facility]:   [place] will be made to provide these services after discharge if needed.  Arrangements include referral to agencies that provide these services.  Your insurance has been verified to be:  [employer] Your primary doctor is:  Steele Sizer, MD  Pertinent  information will be shared with your doctor and your insurance company.  Inpatient Rehabilitation Care Coordinator:  Erlene Quan, [place] or [phone]  Information discussed with and copy given to patient by: Dyanne Iha, 06/01/2021, 11:09 AM

## 2021-06-01 NOTE — Research (Signed)
Pt eligible for SATURN trial which aims to determine whether continuation vs. discontinuation of statin after spontaneous lobar intracerebral hemorrhage would benefit pt. Pt expressed interest for this trial. I called her PCP Dr. Ancil Boozer this am and she was OK for pt to be enrolled in this trial.   Pt signed consent this afternoon and randomized to discontinuation of statin. Crestor has been discontinued. Will inform primary team and her PCP.   Rosalin Hawking, MD PhD Stroke Neurology 06/01/2021 4:37 PM

## 2021-06-01 NOTE — Evaluation (Addendum)
Speech Language Pathology Assessment and Plan  Patient Details  Name: Terri Werner MRN: [id number] Date of Birth: Oct 19, 1950  Today's Date: 06/01/2021 SLP Individual Time: 1100-1200 SLP Individual Time Calculation (min): 60 min  Hospital Problem: Principal Problem:   Intraparenchymal hemorrhage of brain [facility])  Past Medical History:  Past Medical History:  Diagnosis Date   Arthritis    pt reports in back & knees   Colon polyps 2010   At [facility]   Diverticulitis 2016   Genital warts    GERD (gastroesophageal reflux disease)    Heart murmur    Hepatitis B    History of blood transfusion    during each major surgery per pt   History of chicken pox    History of hiatal hernia    History of torn meniscus of right knee    HTN (hypertension)    Hx of migraine headaches    Hypertension    Hypopotassemia    Past Surgical History:  Past Surgical History:  Procedure Laterality Date   ABDOMINAL HYSTERECTOMY  2006   APPENDECTOMY  1980   CESAREAN SECTION  1981   COLONOSCOPY WITH PROPOFOL N/A 11/01/2014   Procedure: COLONOSCOPY WITH PROPOFOL;  Surgeon: Scot Jun, MD;  Location: [facility] ENDOSCOPY;  Service: Endoscopy;  Laterality: N/A;   COLONOSCOPY WITH PROPOFOL N/A 01/05/2020   Procedure: COLONOSCOPY WITH PROPOFOL;  Surgeon: Regis Bill, MD;  Location: [facility] ENDOSCOPY;  Service: Endoscopy;  Laterality: N/A;   ECTOPIC PREGNANCY SURGERY  1980   HERNIA REPAIR  2006   Umbilical   KNEE ARTHROSCOPY Left 2006   torn meniscus   TONSILLECTOMY  1971   TOTAL ABDOMINAL HYSTERECTOMY W/ BILATERAL SALPINGOOPHORECTOMY  2006   VESICOVAGINAL FISTULA CLOSURE W/ TAH      Assessment / Plan / Recommendation Clinical Impression [name]. Terri Werner is a 71 year old right-handed female with history of hepatitis B, hyperlipidemia, hypertension but ran out of her medications and did not seek a refill.  Per chart review patient lives alone.  1 level home ramped entrance.   Recently retired in November.  She has a daughter and niece in the area.  Presented 05/27/2021 with sudden onset of right leg weakness and mild headache.  Cranial CT scan showed acute parenchymal hemorrhage in the parasagittal left frontal parietal lobes with mild edema. MRI follow-up showed no mass or infarct seen underlying the left posterior frontal hematoma. Neurology follow-up IPH likely caused by hypertension monitored closely and she was initially maintained on Cleviprex. Tolerating a regular consistency diet.   Patient presents with cognitive-linguistic skills WFL. Pt feels she is at her baseline with no concerns pertaining to speech therapy at this time. SLP administered the [facility] Mental Status Examination (SLUMS) and patient scored  70/30 points with a score of 27 or above considered normal based on patient's educational history. Patient's overall auditory comprehension and verbal expression appeared [facility] for all tasks assessed and patient was 100% intelligible at the conversation level with no concerns for dysarthria. Patient with intact higher level cognitive skills and executive functions for all tasks assessed with appropriate insight and anticipatory awareness. Pt to discharge home with the support of her daughter. Skilled SLP services do not appear clinically indicated at this time. ST to sign off.   Skilled Therapeutic Interventions          Pt participated in [employer] [facility] Mental Status Examination (SLUMS) as well as further non-standardized assessments of cognitive-linguistic, speech, and language function. Please see  above.     SLP Assessment  Patient does not need any further Speech Lanaguage Pathology Services    Recommendations  Patient destination: Home Follow up Recommendations: None Equipment Recommended: None recommended by SLP           Pain  No pain  Prior Functioning Cognitive/Linguistic Baseline: Within functional limits Type of Home: House   Lives With: Alone Available Help at Discharge: Family Education: Master's x2 Vocation: Retired  SLP Evaluation Cognition Overall Cognitive Status: Within Functional Limits for tasks assessed Arousal/Alertness: Awake/alert Orientation Level: Oriented X4 Year: 2023 Month: January Day of Week: Correct Focused Attention: Appears intact Sustained Attention: Appears intact Alternating Attention: Appears intact Memory: Appears intact Awareness: Appears intact Problem Solving: Appears intact Safety/Judgment: Appears intact  Comprehension Auditory Comprehension Overall Auditory Comprehension: Appears within functional limits for tasks assessed Expression Expression Primary Mode of Expression: Verbal Verbal Expression Overall Verbal Expression: Appears within functional limits for tasks assessed Written Expression Dominant Hand: Right Oral Motor Oral Motor/Sensory Function Overall Oral Motor/Sensory Function: Within functional limits Motor Speech Overall Motor Speech: Appears within functional limits for tasks assessed Articulation: Within functional limitis Intelligibility: Intelligible Motor Planning: Witnin functional limits  Care Tool Care Tool Cognition Ability to hear (with hearing aid or hearing appliances if normally used Ability to hear (with hearing aid or hearing appliances if normally used): 0. Adequate - no difficulty in normal conservation, social interaction, listening to TV   Expression of Ideas and Wants Expression of Ideas and Wants: 4. Without difficulty (complex and basic) - expresses complex messages without difficulty and with speech that is clear and easy to understand   Understanding Verbal and Non-Verbal Content Understanding Verbal and Non-Verbal Content: 4. Understands (complex and basic) - clear comprehension without cues or repetitions  Memory/Recall Ability Memory/Recall Ability : Current season;Staff names and faces;That he or she is in a  hospital/hospital unit   Intelligibility: Intelligible  Recommendations for other services: None   Discharge Criteria: Patient will be discharged from SLP if patient refuses treatment 3 consecutive times without medical reason, if treatment goals not met, if there is a change in medical status, if patient makes no progress towards goals or if patient is discharged from hospital.  The above assessment, treatment plan, treatment alternatives and goals were discussed and mutually agreed upon: by patient  Tamala Ser 06/01/2021, 11:58 AM

## 2021-06-01 NOTE — Evaluation (Signed)
Physical Therapy Assessment and Plan  Patient Details  Name: Terri Werner MRN: [id number] Date of Birth: 1950/06/14  PT Diagnosis: Abnormal posture, Abnormality of gait, Difficulty walking, Hemiparesis dominant, Hypertonia, Impaired sensation, Muscle weakness, and Pain in low back Rehab Potential: Good ELOS: ~2.5 - 3 weeks   Today's Date: 06/01/2021 PT Individual Time: 4098-1191 PT Individual Time Calculation (min): 75 min    Hospital Problem: Principal Problem:   Intraparenchymal hemorrhage of brain [facility])   Past Medical History:  Past Medical History:  Diagnosis Date   Arthritis    pt reports in back & knees   Colon polyps 2010   At [facility]   Diverticulitis 2016   Genital warts    GERD (gastroesophageal reflux disease)    Heart murmur    Hepatitis B    History of blood transfusion    during each major surgery per pt   History of chicken pox    History of hiatal hernia    History of torn meniscus of right knee    HTN (hypertension)    Hx of migraine headaches    Hypertension    Hypopotassemia    Past Surgical History:  Past Surgical History:  Procedure Laterality Date   ABDOMINAL HYSTERECTOMY  2006   APPENDECTOMY  1980   CESAREAN SECTION  1981   COLONOSCOPY WITH PROPOFOL N/A 11/01/2014   Procedure: COLONOSCOPY WITH PROPOFOL;  Surgeon: Scot Jun, MD;  Location: [facility] ENDOSCOPY;  Service: Endoscopy;  Laterality: N/A;   COLONOSCOPY WITH PROPOFOL N/A 01/05/2020   Procedure: COLONOSCOPY WITH PROPOFOL;  Surgeon: Regis Bill, MD;  Location: [facility] ENDOSCOPY;  Service: Endoscopy;  Laterality: N/A;   ECTOPIC PREGNANCY SURGERY  1980   HERNIA REPAIR  2006   Umbilical   KNEE ARTHROSCOPY Left 2006   torn meniscus   TONSILLECTOMY  1971   TOTAL ABDOMINAL HYSTERECTOMY W/ BILATERAL SALPINGOOPHORECTOMY  2006   VESICOVAGINAL FISTULA CLOSURE W/ TAH      Assessment & Plan Clinical Impression: Patient is a 71 y.o. right-handed female with history of hepatitis  B, hyperlipidemia, hypertension but ran out of her medications and did not seek a refill.  Per chart review patient lives alone.  1 level home ramped entrance.  Recently retired in November.  She has a daughter and niece in the area.  Presented 05/27/2021 with sudden onset of right leg weakness and mild headache.  Cranial CT scan showed acute parenchymal hemorrhage in the parasagittal left frontal parietal lobes with mild edema.  No substantial mass-effect.  CT angiogram head and neck no abnormal vascularity in the region of hemorrhage no hemodynamically significant stenosis.  MRI follow-up showed no mass or infarct seen underlying the left posterior frontal hematoma.  Admission chemistries were unremarkable except glucose 103, urine drug screen negative, urine culture greater 100,000 gram-negative rods.  Patient was placed on Keflex x5 days for UTI.  EEG showed no seizure activity.  Echocardiogram with ejection fraction of 60 to 65% no wall motion abnormalities grade 1 diastolic dysfunction.  Neurology follow-up IPH likely caused by hypertension monitored closely and she was initially maintained on Cleviprex.  Tolerating a regular consistency diet.  Therapy evaluations completed due to patient's right side weakness decreased functional mobility was admitted for a comprehensive rehab program. Patient transferred to CIR on 05/31/2021 .   Patient currently requires  +2 max assist  with mobility secondary to muscle weakness and muscle joint tightness, decreased cardiorespiratoy endurance, impaired timing and sequencing, abnormal tone, and unbalanced muscle activation,  decreased attention to right,  , and decreased sitting balance, decreased standing balance, decreased postural control, hemiplegia, and decreased balance strategies.  Prior to hospitalization, patient was independent  with mobility and lived with Alone in a House home.  Home access is  Ramped entrance.  Patient will benefit from skilled PT intervention  to maximize safe functional mobility, minimize fall risk, and decrease caregiver burden for planned discharge home with 24 hour assist.  Anticipate patient will benefit from follow up [facility] at discharge.  PT - End of Session Activity Tolerance: Tolerates 30+ min activity with multiple rests Endurance Deficit: Yes Endurance Deficit Description: requires seated rest breaks PT Assessment Rehab Potential (ACUTE/IP ONLY): Good PT Barriers to Discharge: Decreased caregiver support;Incontinence PT Patient demonstrates impairments in the following area(s): Balance;Perception;Behavior;Safety;Edema;Sensory;Endurance;Skin Integrity;Motor;Nutrition;Pain PT Transfers Functional Problem(s): Bed Mobility;Bed to Chair;Car;Furniture PT Locomotion Functional Problem(s): Ambulation;Wheelchair Mobility;Stairs PT Plan PT Intensity: Minimum of 1-2 x/day ,45 to 90 minutes PT Frequency: 5 out of 7 days PT Duration Estimated Length of Stay: ~2.5 - 3 weeks PT Treatment/Interventions: Ambulation/gait training;Community reintegration;DME/adaptive equipment instruction;Neuromuscular re-education;Psychosocial support;Stair training;UE/LE Strength taining/ROM;Wheelchair propulsion/positioning;Balance/vestibular training;Discharge planning;Functional electrical stimulation;Pain management;Skin care/wound management;Therapeutic Activities;UE/LE Coordination activities;Cognitive remediation/compensation;Disease management/prevention;Functional mobility training;Patient/family education;Splinting/orthotics;Therapeutic Exercise;Visual/perceptual remediation/compensation PT Transfers Anticipated Outcome(s): CGA using LRAD PT Locomotion Anticipated Outcome(s): min assist using LRAD PT Recommendation Recommendations for Other Services: Therapeutic Recreation consult Therapeutic Recreation Interventions: Stress management Follow Up Recommendations: Home health PT;24 hour supervision/assistance Patient destination: Home Equipment  Recommended: To be determined   PT Evaluation Precautions/Restrictions Precautions Precautions: Fall;Other (comment) Precaution Comments: R hemiparesis, R LE extensor tone Restrictions Weight Bearing Restrictions: No Pain Pain Assessment Pain Scale: 0-10 Pain Score: 0-No pain (reports has chronic low back pain and recent medication administration) Pain Interference Pain Interference Pain Effect on Sleep: 2. Occasionally Pain Interference with Therapy Activities: 2. Occasionally Pain Interference with Day-to-Day Activities: 1. 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 Vision/Perception  Vision - History Ability to See in Adequate Light: 0 Adequate Perception Perception: Impaired Inattention/Neglect: Does not attend to right side of body Praxis Praxis: Impaired Praxis Impairment Details: Motor planning  Cognition  Overall Cognitive Status: Within Functional Limits for tasks assessed Arousal/Alertness: Awake/alert Orientation Level: Oriented X4 Year: 2023 Month: January Day of Week: Correct Attention: Focused;Sustained;Selective Focused Attention: Appears intact Sustained Attention: Appears intact Selective Attention: Appears intact  Memory: Appears intact Awareness: Appears intact Safety/Judgment: Appears intact Sensation Sensation Light Touch: Impaired Detail Light Touch Impaired Details: Impaired RUE;Impaired RLE Hot/Cold:  Not tested Proprioception: Impaired Detail Proprioception Impaired Details: Impaired RUE;Impaired RLE Stereognosis: Not tested Coordination Gross Motor Movements are Fluid and Coordinated: No Coordination and Movement Description: R hemiparesis as well as significant R LE extensor tone and impaired trunk control Heel Shin Test: unable to perform on R LE due to paresis and extensor tone Motor   Motor Motor: Abnormal tone;Hemiplegia;Abnormal postural alignment and control Motor - Skilled Clinical Observations: R hemiparesis and hypertonia in R LE   Trunk/Postural Assessment  Cervical Assessment Cervical Assessment: Exceptions to [facility] (forward head) Thoracic Assessment Thoracic  Assessment: Exceptions to [facility] (rounded shoulders) Lumbar Assessment Lumbar Assessment: Exceptions to [facility] (posterior pelvic tilt) Postural Control Postural Control: Deficits on evaluation Trunk Control: requires up to mod assist for trunk control due to posterior LOB during dynamic tasks Righting Reactions: pt presents with delayed righting response.  Balance Balance Balance Assessed: Yes Standardized Balance Assessment Standardized Balance Assessment: Berg Balance Test Berg Balance Test Sit to Stand: Needs moderate or maximal assist to stand Standing Unsupported: Unable to stand 30 seconds unassisted Sitting with Back Unsupported but Feet Supported on Floor or Stool: Able to sit 2 minutes under supervision Stand to Sit: Needs assistance to sit Transfers: Needs two people to assist of supervise to be safe Standing Unsupported with Eyes Closed: Needs help to keep from falling Standing Ubsupported with Feet Together: Needs help to attain position and unable to hold for 15 seconds From Standing, Reach Forward with Outstretched Arm: Loses balance while trying/requires external support From Standing Position, Pick up Object from Floor: Unable to try/needs assist to keep balance From Standing Position, Turn to Look  Behind Over each Shoulder: Needs assist to keep from losing balance and falling Turn 360 Degrees: Needs assistance while turning Standing Unsupported, Alternately Place Feet on Step/Stool: Needs assistance to keep from falling or unable to try Standing Unsupported, One Foot in Front: Loses balance while stepping or standing Standing on One Leg: Unable to try or needs assist to prevent fall Total Score: 3 Static Sitting Balance Static Sitting - Balance Support: Feet supported Static Sitting - Level of Assistance: 5: Stand by assistance Dynamic Sitting Balance Dynamic Sitting - Balance Support: Feet supported Dynamic Sitting - Level of Assistance: 3: Mod assist;2: Max assist Static Standing Balance Static Standing - Balance Support: During functional activity Static Standing - Level of Assistance: 1: +2 Total assist Dynamic Standing Balance Dynamic Standing - Balance Support: During functional activity;Left upper extremity supported Dynamic Standing - Level of Assistance: 1: +2 Total assist Extremity Assessment    RLE Assessment RLE Assessment: Exceptions to [facility] General Strength Comments: assessed in sitting with extensor tone kicking in with hip/knee movements RLE Strength Right Hip Flexion: 2-/5 Right Knee Flexion: 1/5 Right Knee Extension: 2-/5 Right Ankle Dorsiflexion: 0/5 Right Ankle Plantar Flexion: 0/5 RLE Tone RLE Tone: Hypertonic;Moderate RLE Tone Comments: extensor tone LLE Assessment LLE Assessment: Exceptions to [facility] Active Range of Motion (AROM) Comments: WFL General Strength Comments: assessed in sitting LLE Strength Left Hip Flexion: 4/5 Left Knee Flexion: 4+/5 Left Knee Extension: 4+/5 Left Ankle Dorsiflexion: 4+/5 Left Ankle Plantar Flexion: 4+/5  Care Tool Care Tool Bed Mobility Roll left and right activity   Roll left and right assist level: Maximal Assistance - Patient 25 - 49%    Sit to lying activity   Sit to lying assist level: 2 Helpers    Lying  to sitting on side of bed activity   Lying to sitting on side of bed assist level: the ability to move from lying on the back to sitting on the side of the bed with no back support.: 2 Helpers     Care Tool Transfers Sit to stand transfer   Sit to stand assist level: 2 Helpers    Chair/bed transfer   Chair/bed transfer assist level: 2 Helpers (squat pivot)     Scientist, research (physical sciences) transfer activity did not occur: Safety/medical concerns        Care Tool Locomotion Ambulation Ambulation activity did not occur: Safety/medical  concerns        Walk 10 feet activity Walk 10 feet activity did not occur: Safety/medical concerns       Walk 50 feet with 2 turns activity Walk 50 feet with 2 turns activity did not occur: Safety/medical concerns      Walk 150 feet activity Walk 150 feet activity did not occur: Safety/medical concerns      Walk 10 feet on uneven surfaces activity Walk 10 feet on uneven surfaces activity did not occur: Safety/medical concerns      Stairs Stair activity did not occur: Safety/medical concerns        Walk up/down 1 step activity Walk up/down 1 step or curb (drop down) activity did not occur: Safety/medical concerns      Walk up/down 4 steps activity Walk up/down 4 steps activity did not occur: Safety/medical concerns      Walk up/down 12 steps activity Walk up/down 12 steps activity did not occur: Safety/medical concerns      Pick up small objects from floor   Pick up small object from the floor assist level: Dependent - Patient 0%;2 helpers    Wheelchair Is the patient using a wheelchair?: Yes Type of Wheelchair: Manual   Wheelchair assist level: Dependent - Patient 0%    Wheel 50 feet with 2 turns activity   Assist Level: Dependent - Patient 0%  Wheel 150 feet activity   Assist Level: Dependent - Patient 0%    Refer to Care Plan for Long Term Goals  SHORT TERM GOAL WEEK 1 PT Short Term Goal 1 (Week 1): Pt will  perform supine<>sit with mod assist of 1 PT Short Term Goal 2 (Week 1): Pt will perform sit<>stands using LRAD with mod assist of 1 PT Short Term Goal 3 (Week 1): Pt will perform bed<>chair transfers using LRAD with mod assist of 1 PT Short Term Goal 4 (Week 1): Pt will ambulate at least 88ft using LRAD with +2 assist  Recommendations for other services: Therapeutic Recreation  Stress management  Skilled Therapeutic Intervention Pt received sitting in w/c and agreeable to therapy session. Pt noted to have R arm hanging down beside of the w/c with some R hemibody inattention as pt does not move it to a supported position until she starts talking with her hands. Evaluation completed (see details above) with patient education regarding purpose of PT evaluation, PT POC and goals, therapy schedule, weekly team meetings, and other CIR information including safety plan and fall risk safety. Pt performed the below functional mobility tasks with skilled assistance and cuing as described. Pt demonstrates strong extensor tone in RLE that is more prominent when lying supine - educated pt on performing supine bridges (x8 reps during session) to aid in tone management. During squat pivot transfers educated pt on head/hips relationship with good understanding but needing cues for carryover. Therapist provided pt with wider wheelchair that has lower floor-to-seat height with contoured cushion to improve pt's positioning, increase independence with transfers, and promote increased upright, OOB activity tolerance. Therapist donned R LE [place] sprystep PLS AFO to protect pt's ankle and assist with swing phase advancement. Gait training ~42ft at L hallway rail with mirror feedback - heavy max assist of 1 for balance due to R lean and R LE management with +2 providing close w/c follow - pt requires heavy assist and cuing to weight shift L during stance to allow thearpist to provided total assist to advance R LE during swing -  thearpist has to  block R knee buckle during stance with cuing and facilitation for hip extension to maintain upright posture (improved with mirror feedback). At end of session, pt left seated in w/c with needs in reach and chair alarm on.  Mobility Bed Mobility Bed Mobility: Supine to Sit;Sit to Supine Supine to Sit: 2 Helpers;Moderate Assistance - Patient 50-74% (+ 2 mod assist) Sit to Supine: 2 Helpers;Moderate Assistance - Patient 50-74% (+2 mod assist) Transfers Transfers: Sit to Stand;Stand to Sit;Squat Pivot Transfers Sit to Stand: 2 Helpers (+2 mod assist) Stand to Sit: 2 Helpers (+2 mod assist) Squat Pivot Transfers: 2 Helpers (+2 mod assist) Transfer (Assistive device): None Locomotion  Gait Ambulation: Yes Gait Assistance: 2 Helpers (max assist of 1 and +2 w/c follow) Gait Distance (Feet): 15 Feet Assistive device: Other (Comment) (L hallway rail and wearing R LE AFO) Gait Assistance Details: Manual facilitation for weight bearing;Manual facilitation for weight shifting;Manual facilitation for placement;Verbal cues for gait pattern;Verbal cues for technique;Verbal cues for sequencing;Visual cues/gestures for sequencing;Visual cues/gestures for precautions/safety;Tactile cues for placement;Tactile cues for weight shifting;Tactile cues for sequencing;Tactile cues for posture;Tactile cues for initiation;Tactile cues for weight beaing Gait Assistance Details: total assist to block R knee buckle in stance and to facilitate swing phase advancement Gait Gait: Yes Gait Pattern: Impaired Gait Pattern: Decreased step length - right;Step-to pattern;Decreased step length - left;Decreased stance time - right;Decreased stride length;Decreased hip/knee flexion - right;Poor foot clearance - right;Wide base of support;Decreased dorsiflexion - right;Decreased weight shift to left Stairs / Additional Locomotion Stairs: No Wheelchair Mobility Wheelchair Mobility: No   Discharge Criteria: Patient  will be discharged from PT if patient refuses treatment 3 consecutive times without medical reason, if treatment goals not met, if there is a change in medical status, if patient makes no progress towards goals or if patient is discharged from hospital.  The above assessment, treatment plan, treatment alternatives and goals were discussed and mutually agreed upon: by patient  Ginny Forth , PT, DPT, NCS, CSRS  06/01/2021, 1:00 PM

## 2021-06-01 NOTE — Plan of Care (Signed)
°  Problem: RH Balance Goal: LTG Patient will maintain dynamic sitting balance (PT) Description: LTG:  Patient will maintain dynamic sitting balance with assistance during mobility activities (PT) Flowsheets (Taken 06/01/2021 1832) LTG: Pt will maintain dynamic sitting balance during mobility activities with:: Supervision/Verbal cueing Goal: LTG Patient will maintain dynamic standing balance (PT) Description: LTG:  Patient will maintain dynamic standing balance with assistance during mobility activities (PT) Flowsheets (Taken 06/01/2021 1832) LTG: Pt will maintain dynamic standing balance during mobility activities with:: Minimal Assistance - Patient > 75%   Problem: Sit to Stand Goal: LTG:  Patient will perform sit to stand with assistance level (PT) Description: LTG:  Patient will perform sit to stand with assistance level (PT) Flowsheets (Taken 06/01/2021 1832) LTG: PT will perform sit to stand in preparation for functional mobility with assistance level: Contact Guard/Touching assist   Problem: RH Bed Mobility Goal: LTG Patient will perform bed mobility with assist (PT) Description: LTG: Patient will perform bed mobility with assistance, with/without cues (PT). Flowsheets (Taken 06/01/2021 1832) LTG: Pt will perform bed mobility with assistance level of: Contact Guard/Touching assist   Problem: RH Bed to Chair Transfers Goal: LTG Patient will perform bed/chair transfers w/assist (PT) Description: LTG: Patient will perform bed to chair transfers with assistance (PT). Flowsheets (Taken 06/01/2021 1832) LTG: Pt will perform Bed to Chair Transfers with assistance level: Minimal Assistance - Patient > 75%   Problem: RH Car Transfers Goal: LTG Patient will perform car transfers with assist (PT) Description: LTG: Patient will perform car transfers with assistance (PT). Flowsheets (Taken 06/01/2021 1832) LTG: Pt will perform car transfers with assist:: Minimal Assistance - Patient > 75%    Problem: RH Ambulation Goal: LTG Patient will ambulate in controlled environment (PT) Description: LTG: Patient will ambulate in a controlled environment, # of feet with assistance (PT). Flowsheets (Taken 06/01/2021 1832) LTG: Pt will ambulate in controlled environ  assist needed:: Minimal Assistance - Patient > 75% LTG: Ambulation distance in controlled environment: 163ft using LRAD Goal: LTG Patient will ambulate in home environment (PT) Description: LTG: Patient will ambulate in home environment, # of feet with assistance (PT). Flowsheets (Taken 06/01/2021 1832) LTG: Pt will ambulate in home environ  assist needed:: Minimal Assistance - Patient > 75% LTG: Ambulation distance in home environment: 6ft using LRAD

## 2021-06-01 NOTE — Plan of Care (Signed)
Problem: RH Balance Goal: LTG Patient will maintain dynamic standing with ADLs (OT) Description: LTG:  Patient will maintain dynamic standing balance with assist during activities of daily living (OT)  Flowsheets (Taken 06/01/2021 1357) LTG: Pt will maintain dynamic standing balance during ADLs with: Minimal Assistance - Patient > 75%   Problem: Sit to Stand Goal: LTG:  Patient will perform sit to stand in prep for activites of daily living with assistance level (OT) Description: LTG:  Patient will perform sit to stand in prep for activites of daily living with assistance level (OT) Flowsheets (Taken 06/01/2021 1357) LTG: PT will perform sit to stand in prep for activites of daily living with assistance level: Minimal Assistance - Patient > 75%   Problem: RH Eating Goal: LTG Patient will perform eating w/assist, cues/equip (OT) Description: LTG: Patient will perform eating with assist, with/without cues using equipment (OT) Flowsheets (Taken 06/01/2021 1357) LTG: Pt will perform eating with assistance level of: Set up assist    Problem: RH Grooming Goal: LTG Patient will perform grooming w/assist,cues/equip (OT) Description: LTG: Patient will perform grooming with assist, with/without cues using equipment (OT) Flowsheets (Taken 06/01/2021 1357) LTG: Pt will perform grooming with assistance level of: Set up assist    Problem: RH Bathing Goal: LTG Patient will bathe all body parts with assist levels (OT) Description: LTG: Patient will bathe all body parts with assist levels (OT) Flowsheets (Taken 06/01/2021 1357) LTG: Pt will perform bathing with assistance level/cueing: Minimal Assistance - Patient > 75%   Problem: RH Dressing Goal: LTG Patient will perform upper body dressing (OT) Description: LTG Patient will perform upper body dressing with assist, with/without cues (OT). Flowsheets (Taken 06/01/2021 1357) LTG: Pt will perform upper body dressing with assistance level of: Minimal  Assistance - Patient > 75% Goal: LTG Patient will perform lower body dressing w/assist (OT) Description: LTG: Patient will perform lower body dressing with assist, with/without cues in positioning using equipment (OT) Flowsheets (Taken 06/01/2021 1357) LTG: Pt will perform lower body dressing with assistance level of: Minimal Assistance - Patient > 75%   Problem: RH Toileting Goal: LTG Patient will perform toileting task (3/3 steps) with assistance level (OT) Description: LTG: Patient will perform toileting task (3/3 steps) with assistance level (OT)  Flowsheets (Taken 06/01/2021 1357) LTG: Pt will perform toileting task (3/3 steps) with assistance level: Minimal Assistance - Patient > 75%   Problem: RH Functional Use of Upper Extremity Goal: LTG Patient will use RT/LT upper extremity as a (OT) Description: LTG: Patient will use right/left upper extremity as a stabilizer/gross assist/diminished/nondominant/dominant level with assist, with/without cues during functional activity (OT) Flowsheets (Taken 06/01/2021 1357) LTG: Use of upper extremity in functional activities: RUE as nondominant level LTG: Pt will use upper extremity in functional activity with assistance level of: Set up assist   Problem: RH Toilet Transfers Goal: LTG Patient will perform toilet transfers w/assist (OT) Description: LTG: Patient will perform toilet transfers with assist, with/without cues using equipment (OT) Flowsheets (Taken 06/01/2021 1357) LTG: Pt will perform toilet transfers with assistance level of: Minimal Assistance - Patient > 75%   Problem: RH Tub/Shower Transfers Goal: LTG Patient will perform tub/shower transfers w/assist (OT) Description: LTG: Patient will perform tub/shower transfers with assist, with/without cues using equipment (OT) Flowsheets (Taken 06/01/2021 1357) LTG: Pt will perform tub/shower stall transfers with assistance level of: Minimal Assistance - Patient > 75%   Problem: RH  Memory Goal: LTG Patient will demonstrate ability for day to day recall/carry over during activities  of daily living with assistance level (OT) Description: LTG:  Patient will demonstrate ability for day to day recall/carry over during activities of daily living with assistance level (OT). Flowsheets (Taken 06/01/2021 1357) LTG:  Patient will demonstrate ability for day to day recall/carry over during activities of daily living with assistance level (OT): Independent   Problem: RH Awareness Goal: LTG: Patient will demonstrate awareness during functional activites type of (OT) Description: LTG: Patient will demonstrate awareness during functional activites type of (OT) Flowsheets (Taken 06/01/2021 1357) Patient will demonstrate awareness during functional activites type of: Anticipatory LTG: Patient will demonstrate awareness during functional activites type of (OT): Modified Independent

## 2021-06-01 NOTE — Progress Notes (Signed)
Inpatient Rehabilitation  Patient information reviewed and entered into eRehab system by Churchill Grimsley M. Britany Callicott, M.A., CCC/SLP, PPS Coordinator.  Information including medical coding, functional ability and quality indicators will be reviewed and updated through discharge.    

## 2021-06-01 NOTE — Progress Notes (Signed)
PROGRESS NOTE   Subjective/Complaints: No issues overnite, discussed BP, her home dose of amlodipine was "a full tablet"  however 2.5 mg listed in chart Reviewed labs ROS- neg CP, SOB , N/V/D  Objective:   No results found. Recent Labs    06/01/21 0510  WBC 5.5  HGB 12.1  HCT 37.4  PLT 181   Recent Labs    06/01/21 0510  NA 143  K 3.6  CL 107  CO2 23  GLUCOSE 127*  BUN 16  CREATININE 1.12*  CALCIUM 9.3    Intake/Output Summary (Last 24 hours) at 06/01/2021 0823 Last data filed at 05/31/2021 1836 Gross per 24 hour  Intake 240 ml  Output --  Net 240 ml        Physical Exam: Vital Signs Blood pressure (!) 164/88, pulse 80, temperature 99.1 F (37.3 C), temperature source Oral, resp. rate 18, height 5\' 2"  (1.575 m), weight 103.4 kg, SpO2 98 %.   General: No acute distress Mood and affect are appropriate Heart: Regular rate and rhythm no rubs murmurs or extra sounds Lungs: Clear to auscultation, breathing unlabored, no rales or wheezes Abdomen: Positive bowel sounds, soft nontender to palpation, nondistended Extremities: No clubbing, cyanosis, or edema Skin: No evidence of breakdown, no evidence of rash Neurologic: Cranial nerves II through XII intact, motor strength is 5/5 in bilateral deltoid, bicep, tricep, grip, Left hip flexor, knee extensors, ankle dorsiflexor and plantar flexor RIght hip flexor knee ext trace , 0/5 at ankle  Sensory exam normal sensation to light touch and proprioception in bilateral upper and lower extremities Cerebellar exam normal finger to nose to finger as well as heel to shin in bilateral upper and lower extremities Musculoskeletal: Full range of motion in all 4 extremities. No joint swelling   Assessment/Plan: 1. Functional deficits which require 3+ hours per day of interdisciplinary therapy in a comprehensive inpatient rehab setting. Physiatrist is providing close team  supervision and 24 hour management of active medical problems listed below. Physiatrist and rehab team continue to assess barriers to discharge/monitor patient progress toward functional and medical goals  Care Tool:  Bathing              Bathing assist       Upper Body Dressing/Undressing Upper body dressing   What is the patient wearing?: Hospital gown only    Upper body assist Assist Level: Maximal Assistance - Patient 25 - 49%    Lower Body Dressing/Undressing Lower body dressing      What is the patient wearing?: Incontinence brief, Hospital gown only     Lower body assist       Toileting Toileting    Toileting assist Assist for toileting: Maximal Assistance - Patient 25 - 49%     Transfers Chair/bed transfer  Transfers assist           Locomotion Ambulation   Ambulation assist              Walk 10 feet activity   Assist           Walk 50 feet activity   Assist           Walk  150 feet activity   Assist           Walk 10 feet on uneven surface  activity   Assist           Wheelchair     Assist Is the patient using a wheelchair?: No             Wheelchair 50 feet with 2 turns activity    Assist            Wheelchair 150 feet activity     Assist          Blood pressure (!) 164/88, pulse 80, temperature 99.1 F (37.3 C), temperature source Oral, resp. rate 18, height 5\' 2"  (1.575 m), weight 103.4 kg, SpO2 98 %.    Medical Problem List and Plan: 1. 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  Antithrombotics: -DVT/anticoagulation:  Mechanical:  Antiembolism stockings, knee (TED hose) Bilateral lower extremities             -antiplatelet therapy: N/A 3. Pain Management: Tylenol as needed 4.  Mood: Provide emotional support             -antipsychotic agents: N/A 5. Neuropsych: This patient is capable of making decisions on her own behalf. 6. Skin/Wound Care: Routine skin checks 7. Fluids/Electrolytes/Nutrition: Routine in and outs with follow-up chemistries 8.  Hypertension.  Uncontrolled. Start magnesium gluconate 250mg  HS and check magnesium level tomorrow morning. Patient initially on Cleviprex.  Monitor with increased mobility Vitals:   06/01/21 0454 06/01/21 0505  BP: (!) 164/88   Pulse: 79 80  Resp: 18   Temp: 99.1 F (37.3 C)   SpO2: (!) 69% 98%  IPH with sys BP goal <129mmHg , was on amlodipine 2.5 mg qd PTA will resume and monitor   9.  Gram-negative rod UTI.  Completing course of Keflex. Add probiotic 10.  GERD.  Protonix 11.  Hyperlipidemia.  Rosuvastatin 5 mg daily resume at discharge. 12.  Obesity.  BMI 42.05.  Dietary follow-up 13. Constipation: start magnesium gluconate 250mg  HS.     LOS: 1 days A FACE TO FACE EVALUATION WAS PERFORMED  Charlett Blake 06/01/2021, 8:23 AM

## 2021-06-01 NOTE — Telephone Encounter (Addendum)
Dr. Erlinda Hong contacted me around 9 am today to explained patient had a brain hemorrhage that is likely secondary to amyloid angiopathy. They are doing a study to find out if statin therapy increases the risk of bleeding and patient is wiling to enroll. She is clinically doing well and is going to rehab but he would like to ask if okay to enroll her on the study. Advised him to follow patient's wishes.

## 2021-06-01 NOTE — Telephone Encounter (Signed)
-----   Message from Royal [place]thorn, [place] sent at 06/01/2021  9:17 AM EST ----- Regarding: Update Note per consult with Dr. Erlinda Hong

## 2021-06-02 LAB — CULTURE, BLOOD (ROUTINE X 2)
Culture: NO GROWTH
Culture: NO GROWTH
Special Requests: ADEQUATE
Special Requests: ADEQUATE

## 2021-06-02 NOTE — Progress Notes (Signed)
Physical Therapy Session Note  Patient Details  Name: Terri Werner MRN: [id number] Date of Birth: [birth year]  Today's Date: 06/02/2021 PT Individual Time: 1101-1158 PT Individual Time Calculation (min): 57 min   Short Term Goals: Week 1:  PT Short Term Goal 1 (Week 1): Pt will perform supine<>sit with mod assist of 1 PT Short Term Goal 2 (Week 1): Pt will perform sit<>stands using LRAD with mod assist of 1 PT Short Term Goal 3 (Week 1): Pt will perform bed<>chair transfers using LRAD with mod assist of 1 PT Short Term Goal 4 (Week 1): Pt will ambulate at least 78ft using LRAD with +2 assist  Skilled Therapeutic Interventions/Progress Updates:     Pt sitting in w/c to start session. Agreeable to PT tx and denies pain while resting.   Reports need to toilet prior to leaving room. [place]edy used for urgency and time. Sit<>stand from w/c to [facility] with minA - able to use both UE 's to assist in pulling to stand but requires assist for R foot placement. Sit's in perched position with CGA during dependent transfer to toilet. Requires minA for controlled lowering in [place] to toilet and totalA for LB dressing in standing. Pt continent of bladder and able to complete pericare without assist. Sit<>Stand in [place] with minA again and totalA needed for LB dressing - transported back to w/c in similar manner and then wheeled downstairs to 21M rehab gym for time.   Donned socks/shoes at dependent level. Completed squat<>pivot transfer with modA from w/c to mat table, blocking R knee to prevent extensor tone and assisting with R foot placement to encourage knee flexion. At edge of mat, sit's unsupported with CGA. Worked on repeated sit<>stands while using large mirror for visual feedback - requiring min/modA for powering to rise and min/modA for static standing balance due to R pushing and posterior lean. In standing, demonstrates poor postural awareness with excessive lumbar lordosis in combination of R  pushing, resulting in increased unsteadiness and difficulty achieving neutral standing. Pt contributes her chronic low back pain to her lordotic posture. While seated, instructed in short<>tall sitting to improve awareness of pelvic positioning to assist with standing balance. Returned to standing position with min/modA but continues to demonstrate lumbar lordosis and R pushing. In standing - worked on lateral weight shifting to reduce R pushing as well as pre-gait training for forward/backward stepping on L to encourage stance control on R - modA level for balance due to worsening R push. Pt assisted back to her w/c via squat<>pivot transfer, towards her stronger L side, with minA (!) and mod instructional cues for setup and sequencing.   Returned upstairs to her room where she was agreeable to remain sitting in w/c, chair alarm on, all need within reach.   Therapy Documentation Precautions:  Precautions Precautions: Fall, Other (comment) Precaution Comments: R hemiparesis, R LE extensor tone Restrictions Weight Bearing Restrictions: No General:    Therapy/Group: Individual Therapy  Alger Simons 06/02/2021, 7:47 AM

## 2021-06-02 NOTE — Progress Notes (Signed)
Occupational Therapy Session Note  Patient Details  Name: Terri Werner MRN: [id number] Date of Birth: 1950-07-10  Today's Date: 06/02/2021 OT Individual Time: 5945-8592 OT Individual Time Calculation (min): 53 min    Short Term Goals: Week 1:  OT Short Term Goal 1 (Week 1): Donn UB clothing items with ModA OT Short Term Goal 2 (Week 1): Patient to complete sit to stand with functional task performance with ModA OT Short Term Goal 3 (Week 1): Patient will complete squat-pivot transfer with MaxAx1 person OT Short Term Goal 4 (Week 1): Pt will attend to RUE in relation to placement during functional task performance  Skilled Therapeutic Interventions/Progress Updates:    Session 1: ([id number]) Pt in the wheelchair to start session with transfer down to the therapy gym for session.  She was able to complete stand pivot transfer to the EOM with max assist.  Increased lean/pushing to the right noted.  Once on the mat, worked on forward and lateral weighshifts with integration of reciprical scooting, bilateral scooting, and scooting up the EOM.  She needs mod facilitation for reciprical scooting on the right side with supervision on the left.  Mod assist for sit to squat transitions with RUE in weightbearing to assist with transition.  Finished session with squat pivot transfer to the wheelchair on the left side with mod assist.  Returned to the room with pt left sitting up in the wheelchair with the call button and phone in reach and safety alarm pad in place.    Session 2: ([id number])  Pt completed RUE coordination tasks from the wheelchair during session.  She was able to complete Nine Hole Peg Test in 30 seconds with the LUE and then 40 seconds with the right.  Had her work on picking up checkers from [place] and place in the grid.  She needed mod instructional cueing to pick up more then one at a time, while translating if from her fingertips to palm and back  before placing it.  She demonstrated very minimal dropping of the checkers but spatially exhibited difficulty lifting her arm high enough to avoid putting pressure on the box with her forearm when retrieving the checkers from it.  On two occassions she flipped the box up, with checkers spilling onto the floor the first time, and then therapist keeping them from falling on the second time.  Encouraged her to work on picking up change while in her room if she can get someone to help provide for her.  Provided half lap tray for support of the RUE at end of the session with safety alarm in place and call button and phone in reach.      Therapy Documentation Precautions:  Precautions Precautions: Fall, Other (comment) Precaution Comments: R hemiparesis, R LE extensor tone Restrictions Weight Bearing Restrictions: No  Pain: Pain Assessment Pain Scale: Faces Pain Score: 0-No pain Faces Pain Scale: Hurts a little bit Pain Type: Chronic pain Pain Location: Back Pain Orientation: Lower Pain Descriptors / Indicators: Discomfort Pain Onset: With Activity ADL: See Care Tool Section for some details of mobility and selfcare   Therapy/Group: Individual Therapy  Artesha Wemhoff OTR/L 06/02/2021, 12:19 PM

## 2021-06-02 NOTE — Progress Notes (Signed)
Occupational Therapy Session Note  Patient Details  Name: Terri Werner MRN: [id number] Date of Birth: 05/18/1950  Today's Date: 06/02/2021 OT Individual Time: 5189-8421 OT Individual Time Calculation (min): 55 min    Short Term Goals: Week 1:  OT Short Term Goal 1 (Week 1): Donn UB clothing items with ModA OT Short Term Goal 2 (Week 1): Patient to complete sit to stand with functional task performance with ModA OT Short Term Goal 3 (Week 1): Patient will complete squat-pivot transfer with MaxAx1 person OT Short Term Goal 4 (Week 1): Pt will attend to RUE in relation to placement during functional task performance  Skilled Therapeutic Interventions/Progress Updates:    Pt received supine with no c/o pain, eager to get OOB for session. She completed bed mobility with mod A to EOB, including RLE management and trunk lifting. Max A stand pivot transfer to the w/c with extensor tone in the RLE. +2 present for safety. Max A stand pivot transfer to the [facility] over toilet with BUE support on the grab bar toward the L. Max A required for toileting tasks. She stood x4 for donning of brief, pants, and hygiene. Mod A to stand with L grab bar and then max A for standing balance with R lean. She completed stand pivot back to the w/c, max A. Pt completed UB ADLs at the sink with min cueing for hemi technique, but pt able to functionally use her RUE. Shirt donned with CGA. She completed oral care in standing with mod A for stabilization. She was taken via w/c to the therapy gym for bimanual UE strengthening/coordination circuit. Great activation in the RUE with coordination deficits. She was left sitting up with all needs met, chair alarm set.   Therapy Documentation Precautions:  Precautions Precautions: Fall, Other (comment) Precaution Comments: R hemiparesis, R LE extensor tone Restrictions Weight Bearing Restrictions: No  Therapy/Group: Individual Therapy  LUMINA GITTO 06/02/2021, 6:28  AM

## 2021-06-02 NOTE — Progress Notes (Signed)
PROGRESS NOTE   Subjective/Complaints:  Reviewed labs ROS- neg CP, SOB , N/V/D  Objective:   No results found. Recent Labs    06/01/21 0510  WBC 5.5  HGB 12.1  HCT 37.4  PLT 181    Recent Labs    06/01/21 0510  NA 143  K 3.6  CL 107  CO2 23  GLUCOSE 127*  BUN 16  CREATININE 1.12*  CALCIUM 9.3     Intake/Output Summary (Last 24 hours) at 06/02/2021 0848 Last data filed at 06/01/2021 1805 Gross per 24 hour  Intake 480 ml  Output --  Net 480 ml         Physical Exam: Vital Signs Blood pressure (!) 150/89, pulse 67, temperature 97.6 F (36.4 C), resp. rate 18, height 5\' 2"  (1.575 m), weight 103.4 kg, SpO2 99 %.   General: No acute distress Mood and affect are appropriate Heart: Regular rate and rhythm no rubs murmurs or extra sounds Lungs: Clear to auscultation, breathing unlabored, no rales or wheezes Abdomen: Positive bowel sounds, soft nontender to palpation, nondistended Extremities: No clubbing, cyanosis, or edema Skin: No evidence of breakdown, no evidence of rash Neurologic: Cranial nerves II through XII intact, motor strength is 5/5 in bilateral deltoid, bicep, tricep, grip, Left hip flexor, knee extensors, ankle dorsiflexor and plantar flexor RIght hip flexor knee ext trace , 0/5 at ankle  Sensory exam normal sensation to light touch and proprioception in bilateral upper and lower extremities Cerebellar exam normal finger to nose to finger as well as heel to shin in bilateral upper and lower extremities Musculoskeletal: Full range of motion in all 4 extremities. No joint swelling   Assessment/Plan: 1. Functional deficits which require 3+ hours per day of interdisciplinary therapy in a comprehensive inpatient rehab setting. Physiatrist is providing close team supervision and 24 hour management of active medical problems listed below. Physiatrist and rehab team continue to assess barriers to  discharge/monitor patient progress toward functional and medical goals  Care Tool:  Bathing    Body parts bathed by patient: Chest, Abdomen, Right upper leg, Left upper leg, Face, Left arm, Right arm   Body parts bathed by helper: Front perineal area, Buttocks, Left lower leg, Right lower leg     Bathing assist       Upper Body Dressing/Undressing Upper body dressing   What is the patient wearing?: Bra, Pull over shirt    Upper body assist Assist Level: Maximal Assistance - Patient 25 - 49%    Lower Body Dressing/Undressing Lower body dressing      What is the patient wearing?: Incontinence brief     Lower body assist Assist for lower body dressing: 2 Helpers     Toileting Toileting    Toileting assist Assist for toileting: 2 Helpers     Transfers Chair/bed transfer  Transfers assist     Chair/bed transfer assist level: 2 Helpers (squat pivot)     Locomotion Ambulation   Ambulation assist   Ambulation activity did not occur: Safety/medical concerns          Walk 10 feet activity   Assist  Walk 10 feet activity did not occur: Safety/medical concerns  Walk 50 feet activity   Assist Walk 50 feet with 2 turns activity did not occur: Safety/medical concerns         Walk 150 feet activity   Assist Walk 150 feet activity did not occur: Safety/medical concerns         Walk 10 feet on uneven surface  activity   Assist Walk 10 feet on uneven surfaces activity did not occur: Safety/medical concerns         Wheelchair     Assist Is the patient using a wheelchair?: Yes Type of Wheelchair: Manual    Wheelchair assist level: Dependent - Patient 0%      Wheelchair 50 feet with 2 turns activity    Assist        Assist Level: Dependent - Patient 0%   Wheelchair 150 feet activity     Assist      Assist Level: Dependent - Patient 0%   Blood pressure (!) 150/89, pulse 67, temperature 97.6 F (36.4 C),  resp. rate 18, height 5\' 2"  (1.575 m), weight 103.4 kg, SpO2 99 %.    Medical Problem List and Plan: 1. 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  Antithrombotics: -DVT/anticoagulation:  Mechanical:  Antiembolism stockings, knee (TED hose) Bilateral lower extremities             -antiplatelet therapy: N/A 3. Pain Management: Tylenol as needed 4. Mood: Provide emotional support             -antipsychotic agents: N/A 5. Neuropsych: This patient is capable of making decisions on her own behalf. 6. Skin/Wound Care: Routine skin checks 7. Fluids/Electrolytes/Nutrition: Routine in and outs with follow-up chemistries 8.  Hypertension.  Uncontrolled. Start magnesium gluconate 250mg  HS and check magnesium level tomorrow morning. Mg++ 2.2-nl Vitals:   06/01/21 2036 06/02/21 0459  BP: (!) 154/82 (!) 150/89  Pulse: 72 67  Resp: 17 18  Temp: 98.4 F (36.9 C) 97.6 F (36.4 C)  SpO2: 98% 99%  IPH with sys BP goal <125mmHg , was on amlodipine 2.5 mg qd PTA will resume and monitor  May need to increase amlodipine in 2-3 d if SYs BP remains elevated   9.  Gram-negative rod UTI.  Completed Keflex- resolved  10.  GERD.  Protonix 11.  Hyperlipidemia.  Rosuvastatin 5 mg daily resume at discharge. 12.  Obesity.  BMI 42.05.  Dietary follow-up 13. Constipation: start magnesium gluconate 250mg  HS.     LOS: 2 days A FACE TO FACE EVALUATION WAS PERFORMED  Charlett Blake 06/02/2021, 8:48 AM

## 2021-06-02 NOTE — IPOC Note (Signed)
Overall Plan of Care [facility]) Patient Details Name: Terri Werner MRN: [id number] DOB: 06-20-1950  Admitting Diagnosis: Intraparenchymal hemorrhage of brain [facility])  Hospital Problems: Principal Problem:   Intraparenchymal hemorrhage of brain [facility])     Functional Problem List: Nursing Pain, Endurance, Bladder, Medication Management, Safety, Bowel  PT Balance, Perception, Behavior, Safety, Edema, Sensory, Endurance, Skin Integrity, Motor, Nutrition, Pain  OT Balance, Endurance, Motor, Safety, Cognition, Perception, Sensory, Pain  SLP    TR         Basic ADLs: OT Grooming, Bathing, Dressing, Eating, Toileting     Advanced  ADLs: OT       Transfers: PT Bed Mobility, Bed to Chair, Car, Occupational psychologist, Research scientist (life sciences): PT Ambulation, Psychologist, prison and probation services, Stairs     Additional Impairments: OT Fuctional Use of Upper Extremity  SLP None      TR      Anticipated Outcomes Item Anticipated Outcome  Self Feeding set-up  Swallowing      Basic self-care  MinA  Toileting  MinA   Bathroom Transfers MinA  Bowel/Bladder  manage bowel with mod I and bladder without assist  Transfers  CGA using LRAD  Locomotion  min assist using LRAD  Communication     Cognition     Pain  pain at or below level 4 with prn meds  Safety/Judgment  maintain safety with cues   Therapy Plan: PT Intensity: Minimum of 1-2 x/day ,45 to 90 minutes PT Frequency: 5 out of 7 days PT Duration Estimated Length of Stay: ~2.5 - 3 weeks OT Intensity: Minimum of 1-2 x/day, 45 to 90 minutes OT Frequency: 5 out of 7 days OT Duration/Estimated Length of Stay: 2-3 weeks     Due to the current state of emergency, patients may not be receiving their 3-hours of Medicare-mandated therapy.   Team Interventions: Nursing Interventions Bladder Management, Disease Management/Prevention, Medication Management, Discharge Planning, Pain Management, Bowel Management, Patient/Family  Education  PT interventions Ambulation/gait training, Community reintegration, DME/adaptive equipment instruction, Neuromuscular re-education, Psychosocial support, Stair training, UE/LE Strength taining/ROM, Wheelchair propulsion/positioning, Warden/ranger, Discharge planning, Functional electrical stimulation, Pain management, Skin care/wound management, Therapeutic Activities, UE/LE Coordination activities, Cognitive remediation/compensation, Disease management/prevention, Functional mobility training, Patient/family education, Splinting/orthotics, Therapeutic Exercise, Visual/perceptual remediation/compensation  OT Interventions Balance/vestibular training, Neuromuscular re-education, Self Care/advanced ADL retraining, Patient/family education, Community reintegration, Therapeutic Exercise, UE/LE Coordination activities, Wheelchair propulsion/positioning, Cognitive remediation/compensation, Discharge planning, Functional mobility training, DME/adaptive equipment instruction, Pain management, Psychosocial support, Therapeutic Activities, UE/LE Strength taining/ROM, Visual/perceptual remediation/compensation  SLP Interventions    TR Interventions    SW/CM Interventions Discharge Planning, Psychosocial Support, Patient/Family Education, Disease Management/Prevention   Barriers to Discharge MD  Medical stability and Weight  Nursing Decreased caregiver support ramped entry solo; sisterm dtr will assist prn  PT Decreased caregiver support, Incontinence    OT      SLP      SW Insurance for SNF coverage     Team Discharge Planning: Destination: PT-Home ,OT- Home , SLP-Home Projected Follow-up: PT-Home health PT, 24 hour supervision/assistance, OT-  Home health OT, SLP-None Projected Equipment Needs: PT-To be determined, OT- To be determined, SLP-None recommended by SLP Equipment Details: PT- , OT-  Patient/family involved in discharge planning: PT- Patient,  OT-Patient,  SLP-Patient  MD ELOS: 18-21d Medical Rehab Prognosis:  Good Assessment: 71 year old right-handed female with history of hepatitis B, hyperlipidemia, hypertension but ran out of her medications and did not seek a refill.  Per chart review patient  lives alone.  1 level home ramped entrance.  Recently retired in November.  She has a daughter and niece in the area.  Presented 05/27/2021 with sudden onset of right leg weakness and mild headache.  Cranial CT scan showed acute parenchymal hemorrhage in the parasagittal left frontal parietal lobes with mild edema.  No substantial mass-effect.  CT angiogram head and neck no abnormal vascularity in the region of hemorrhage no hemodynamically significant stenosis.  MRI follow-up showed no mass or infarct seen underlying the left posterior frontal hematoma.  Admission chemistries were unremarkable except glucose 103, urine drug screen negative, urine culture greater 100,000 gram-negative rods.  Patient was placed on Keflex x5 days for UTI.  EEG showed no seizure activity.  Echocardiogram with ejection fraction of 60 to 65% no wall motion abnormalities grade 1 diastolic dysfunction.  Neurology follow-up IPH likely caused by hypertension monitored closely and she was initially maintained on Cleviprex.  Tolerating a regular consistency diet    See Team Conference Notes for weekly updates to the plan of care

## 2021-06-03 MED ORDER — AMLODIPINE BESYLATE 5 MG PO TABS
5.0000 mg | ORAL_TABLET | Freq: Every day | ORAL | Status: DC
  Administered 2021-06-04 – 2021-06-27 (×24): 5 mg via ORAL
  Filled 2021-06-03 (×24): qty 1

## 2021-06-03 NOTE — Progress Notes (Signed)
Occupational Therapy Session Note  Patient Details  Name: Terri Werner MRN: [id number] Date of Birth: 10-08-1950  Today's Date: 06/03/2021 OT Individual Time: 1006-1102 OT Individual Time Calculation (min): 56 min    Short Term Goals: Week 1:  OT Short Term Goal 1 (Week 1): Donn UB clothing items with ModA OT Short Term Goal 2 (Week 1): Patient to complete sit to stand with functional task performance with ModA OT Short Term Goal 3 (Week 1): Patient will complete squat-pivot transfer with MaxAx1 person OT Short Term Goal 4 (Week 1): Pt will attend to RUE in relation to placement during functional task performance  Skilled Therapeutic Interventions/Progress Updates:    Session 1: ([id number])  Pt sitting up in the wheelchair to start session.  She was taken down to the therapy gym via wheelchair with mod assist squat pivot transfer to the right to the therapy mat.  Once on the mat, had her focus on midline orientation, sit to stand, and dynamic standing balance.  She completed sit to stand with overall mod assist but needs total assist +2 (pt 30%) to remain standing.  Decreased proprioception in the RLE resulting in motor impersistence when attempting to keep the right knee extended.  Increased pusher tendencies also noted to the right in standing with decreased ability to maintain neutral hip and trunk alignment as she would over extend her body with posterior LOB.  Worked on reaching laterally to the left to promote weighshifting to the pushing side while retrieving clothespins with the LUE.  Rest breaks given throughout after standing for 3-4 mins.  While sitting had her work on reaching with the RUE to pick up and place clothespins on the horizontal bars of the grid.  Finished with short distance mobility with total +2 (pt 30%).  Therapist would have to provide overall max facilitation to place and extend the RLE in preparation for weightbearing.  She was however able to advance the  RLE with AFO in place but with increased adduction noted.  Returned to the wheelchair with transfer back to the room where she was left sitting up with the call button and phone in reach and safety alarm belt in place.  She reported some lower back pain with standing during session but no number stated.    Session 2: ([id number])  Pt with no report of pain during session.  Pt completed shower and dressing during session.  She needed max assist for stand pivot transfer to the tub bench and mod assist for turning around and scooting over into the shower with use of the grab bars for support.  Supervision for doffing UB clothing with mod assist to remove LB clothing sit to stand.  She then completed UB bathing with supervision and then needed mod assist for LB bathing sit to stand without attempt to wash her lower legs and feet secondary to decreased space.  Feel she may benefit from [facility] sponge for future use to assist with this.  Once shower was complete and she dried off, she transferred out to the wheelchair at max assist level stand pivot.  She completed UB dressing at supervision level with LB dressing at max assist level sit to stand.  Increased pushing to the right noted with standing when pulling items over her hips.  She needed max assist for donning gripper socks.  Finished session with pt resting in the wheelchair at end of session with the call button and phone in reach.    Therapy Documentation Precautions:  Precautions Precautions: Fall, Other (comment) Precaution Comments: R hemiparesis, R LE extensor tone Restrictions Weight Bearing Restrictions: No  Pain: Pain Assessment Pain Scale: Faces Pain Score: 0-No pain ADL: See Care Tool Section for some details of mobility and selfcare  Therapy/Group: Individual Therapy  Nellie Pester OTR/L 06/03/2021, 12:26 PM

## 2021-06-03 NOTE — Progress Notes (Signed)
PROGRESS NOTE   Subjective/Complaints: Seen walking with Carly in the morning and then later in her room. Cheerful, no questions Was having some pain while ambulating  ROS- neg CP, SOB , N/V/D, +pain  Objective:   No results found. Recent Labs    06/01/21 0510  WBC 5.5  HGB 12.1  HCT 37.4  PLT 181   Recent Labs    06/01/21 0510  NA 143  K 3.6  CL 107  CO2 23  GLUCOSE 127*  BUN 16  CREATININE 1.12*  CALCIUM 9.3    Intake/Output Summary (Last 24 hours) at 06/03/2021 1913 Last data filed at 06/03/2021 1700 Gross per 24 hour  Intake 460 ml  Output --  Net 460 ml        Physical Exam: Vital Signs Blood pressure (!) 159/94, pulse 73, temperature 98.5 F (36.9 C), resp. rate 18, height 5\' 2"  (1.575 m), weight 103.4 kg, SpO2 99 %.  Gen: no distress, normal appearing HEENT: oral mucosa pink and moist, NCAT Cardio: Reg rate Chest: normal effort, normal rate of breathing Abd: soft, non-distended Ext: no edema Psych: pleasant, normal affect Skin: No evidence of breakdown, no evidence of rash Neurologic: Cranial nerves II through XII intact, motor strength is 5/5 in bilateral deltoid, bicep, tricep, grip, Left hip flexor, knee extensors, ankle dorsiflexor and plantar flexor RIght hip flexor knee ext trace , 0/5 at ankle  Sensory exam normal sensation to light touch and proprioception in bilateral upper and lower extremities Cerebellar exam normal finger to nose to finger as well as heel to shin in bilateral upper and lower extremities Musculoskeletal: Full range of motion in all 4 extremities. No joint swelling   Assessment/Plan: 1. Functional deficits which require 3+ hours per day of interdisciplinary therapy in a comprehensive inpatient rehab setting. Physiatrist is providing close team supervision and 24 hour management of active medical problems listed below. Physiatrist and rehab team continue to assess  barriers to discharge/monitor patient progress toward functional and medical goals  Care Tool:  Bathing    Body parts bathed by patient: Chest, Abdomen, Right upper leg, Left upper leg, Face, Left arm, Right arm   Body parts bathed by helper: Front perineal area, Buttocks, Left lower leg, Right lower leg     Bathing assist       Upper Body Dressing/Undressing Upper body dressing   What is the patient wearing?: Bra, Pull over shirt    Upper body assist Assist Level: Maximal Assistance - Patient 25 - 49%    Lower Body Dressing/Undressing Lower body dressing      What is the patient wearing?: Incontinence brief     Lower body assist Assist for lower body dressing: 2 Helpers     Toileting Toileting    Toileting assist Assist for toileting: Maximal Assistance - Patient 25 - 49%     Transfers Chair/bed transfer  Transfers assist     Chair/bed transfer assist level: 2 Helpers (squat pivot)     Locomotion Ambulation   Ambulation assist   Ambulation activity did not occur: Safety/medical concerns  Assist level: 2 helpers (max A of 1 and +2 w/c follow) Assistive device: Other (comment) (  L hallway rail) Max distance: 102ft   Walk 10 feet activity   Assist  Walk 10 feet activity did not occur: Safety/medical concerns        Walk 50 feet activity   Assist Walk 50 feet with 2 turns activity did not occur: Safety/medical concerns         Walk 150 feet activity   Assist Walk 150 feet activity did not occur: Safety/medical concerns         Walk 10 feet on uneven surface  activity   Assist Walk 10 feet on uneven surfaces activity did not occur: Safety/medical concerns         Wheelchair     Assist Is the patient using a wheelchair?: Yes Type of Wheelchair: Manual    Wheelchair assist level: Dependent - Patient 0%      Wheelchair 50 feet with 2 turns activity    Assist        Assist Level: Dependent - Patient 0%    Wheelchair 150 feet activity     Assist      Assist Level: Dependent - Patient 0%   Blood pressure (!) 159/94, pulse 73, temperature 98.5 F (36.9 C), resp. rate 18, height 5\' 2"  (1.575 m), weight 103.4 kg, SpO2 99 %.    Medical Problem List and Plan: 1. 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  Antithrombotics: -DVT/anticoagulation:  Mechanical:  Antiembolism stockings, knee (TED hose) Bilateral lower extremities             -antiplatelet therapy: N/A 3. Lower back pain: kpad added. Tylenol as needed 4. Mood: Provide emotional support             -antipsychotic agents: N/A 5. Neuropsych: This patient is capable of making decisions on her own behalf. 6. Skin/Wound Care: Routine skin checks 7. Fluids/Electrolytes/Nutrition: Routine in and outs with follow-up chemistries 8.  Hypertension.  Uncontrolled. Start magnesium gluconate 250mg  HS and check magnesium level tomorrow morning. Mg++ 2.2-nl Vitals:   06/03/21 0733 06/03/21 1322  BP: (!) 152/81 (!) 159/94  Pulse: 65 73  Resp: (!) 9 18  Temp: 98.3 F (36.8 C) 98.5 F (36.9 C)  SpO2: 100% 99%  IPH with sys BP goal <128mmHg , was on amlodipine 2.5 mg qd PTA will resume and monitor  Increase amlodipine to 5mg  on 1/21  9.  Gram-negative rod UTI.  Completed Keflex- resolved  10.  GERD.  continue Protonix 11.  Hyperlipidemia.  Rosuvastatin 5 mg daily resume at discharge. 12.  Obesity.  BMI 42.05.  Dietary follow-up 13. Constipation: continue magnesium gluconate 250mg  HS.   1/21: Type 5 stool     LOS: 3 days A FACE TO FACE EVALUATION WAS PERFORMED  Clide Deutscher Srihitha Tagliaferri 06/03/2021, 7:13 PM

## 2021-06-03 NOTE — Progress Notes (Signed)
Physical Therapy Session Note  Patient Details  Name: Terri Werner MRN: [id number] Date of Birth: 10-28-1950  Today's Date: 06/03/2021 PT Individual Time: 0807-0903 PT Individual Time Calculation (min): 56 min   Short Term Goals: Week 1:  PT Short Term Goal 1 (Week 1): Pt will perform supine<>sit with mod assist of 1 PT Short Term Goal 2 (Week 1): Pt will perform sit<>stands using LRAD with mod assist of 1 PT Short Term Goal 3 (Week 1): Pt will perform bed<>chair transfers using LRAD with mod assist of 1 PT Short Term Goal 4 (Week 1): Pt will ambulate at least 6ft using LRAD with +2 assist  Skilled Therapeutic Interventions/Progress Updates:    Pt received sitting in w/c and agreeable to therapy session. Pt requesting to bathe but agreeable to wait for OT session in a couple hours to try and get in shower safely. Sitting in w/c donned pants, socks, shoes, and R LE Thuasne Sprystep PLS AFO with total assist for time management - donned shirt with set-up assist. Sit>stand from w/c to no UE support while guarding R knee and provided mod assist for balance due to R posterior lean while pt pulled pants up without assist.  Transported to/from gym in w/c for time management and energy conservation.   Sit>stand at L hallway rail with mod assist for rising to stand and balance due to R lean - guarding R knee buckle but quad activation improving.   Gait training at L hallway rail 44ft +56ft x3 with +2 w/c follow - heavy max assist of 1 for balance and RLE management - pt with strong R lean and intermittent strong anterior lean requiring max verbal cuing and facilitation to return to midline - step-by-step verbal cuing throughout for weight shifting onto stance limb and advancing swing phase limb - blocking R knee during stance with pt demoing increased quad activation and improving knee control compared to 2 days ago at initial evaluation - requires total assist progressed to max assist to  advance R LE during swing with pt demoing increased swing phase activation (wearing AFO) - primarily achieving step-to pattern leading with R LE due to shortened R stance time not allowing full L step length, which improves with increased repetition.   Transported back to room and left sitting in w/c with needs in reach and chair alarm on.  Therapy Documentation Precautions:  Precautions Precautions: Fall, Other (comment) Precaution Comments: R hemiparesis, R LE extensor tone Restrictions Weight Bearing Restrictions: No   Pain: Pt reports "sharp" pain in her low back in same location of her chronic back pain; however, now the pain is "sharp" - states it happens when she moves funny in the bed or also with standing and gait; states it feels better when she holds her back into more extension while standing. Provided seated rest breaks for pain management.    Therapy/Group: Individual Therapy  Tawana Scale , PT, DPT, NCS, CSRS  06/03/2021, 7:39 AM

## 2021-06-05 MED ORDER — CYCLOBENZAPRINE HCL 5 MG PO TABS
5.0000 mg | ORAL_TABLET | Freq: Three times a day (TID) | ORAL | Status: DC | PRN
  Administered 2021-06-05 – 2021-06-19 (×10): 5 mg via ORAL
  Filled 2021-06-05 (×10): qty 1

## 2021-06-05 NOTE — Progress Notes (Addendum)
Occupational Therapy Session Note  Patient Details  Name: Terri Werner MRN: [id number] Date of Birth: Jul 09, 1950  Today's Date: 06/05/2021 OT Individual Time: 1003-1100 OT Individual Time Calculation (min): 57 min    Short Term Goals: Week 1:  OT Short Term Goal 1 (Week 1): Donn UB clothing items with ModA OT Short Term Goal 2 (Week 1): Patient to complete sit to stand with functional task performance with ModA OT Short Term Goal 3 (Week 1): Patient will complete squat-pivot transfer with MaxAx1 person OT Short Term Goal 4 (Week 1): Pt will attend to RUE in relation to placement during functional task performance  Skilled Therapeutic Interventions/Progress Updates:    Session Note: (1003-1101)  No report of pain during session.  Pt worked on bathing and dressing during session per request.  Antony Salmon was used for transfer from the recliner to the bathroom.  Min assist for sit to stand from the recliner, pulling up on the [place].  Total assist +2 was needed to sit down on the tub bench secondary to not being able to get the [facility] close enough with the edge of the shower limiting this.  Would not attempt again with the [facility].  Once on the seat, she needed mod assist to scoot back over onto the tub bench.  Mod instructional cueing for forward trunk flexion with forward weightshift for scooting.  She was able to complete UB bathing in sitting with setup.  LB bathing with mod assist sit to stand using the grab bars for support.  She was able to use the LH sponge for washing the right lower leg and foot, but was able to bring up the left.  In standing, she exhibits increased right pushing as well as knee flexion, requiring mod facilitation secondary to decreased proprioception in the RLE as well decreased spatial orientation.  Once dried off, she was able to complete transfer stand pivot to the wheelchair for dressing with max assist to the right.  She was able to donn her bra with min assist as  well as pullover shirt with supervision.  She then completed donning her underpants and pants at mod assist level over her feet, following hemi techniques.  Max assist for standing to pull them up over her hips.  Therapist provided total assist for TEDs and gripper socks.  Finished session with transfer back to the recliner with use of the [place] per pt request.  Call button and phone in reach with safety alarm in place.    Session 2 Note: ([id number])  Pt transferred from recliner to wheelchair with use of the [facility] and total assist to begin session.  No report of pain to start or at any time during session.  She was able to complete sit to stand with min assist in the [place] and once in the wheelchair she was transported down to the therapy gym via wheelchair.  She was able to transfer squat pivot to the mat on the right side with min assist.  Mod assist for reciprical scooting on the right side secondary to not completing adequate right weightshift.  Once on the mat worked on sit to stand, standing balance, left weightshifts in sitting and standing toward the pushing side.  She was able to complete lateral reaching in sitting to the left with both the left and right UEs to pick up and place clothespins with close supervision.  To complete task in standing, she needs max assist secondary to increased pushing to the right and decreased  weightshift to the left.  She will also fall backwards and rotate to the right with inability to maintain right knee and hip extension without max facilitation.  Completed several repetitions with mod assist for sit to stand transition repeatedly with emphasis on forward weightshift.  However, once standing amount of assist needed to maintain increases to max assist or greater.  Finished session with transfer back to the wheelchair at mod assist squat pivot to the left and return to the room.  She was left sitting up with the call button and phone in reach and safety chair alarm in  place.    Therapy Documentation Precautions:  Precautions Precautions: Fall, Other (comment) Precaution Comments: R hemiparesis, R LE extensor tone Restrictions Weight Bearing Restrictions: No  Pain: Pain Assessment Pain Scale: Faces Pain Score: 0-No pain Pain Type: Chronic pain Pain Location: Back Pain Descriptors / Indicators: Aching Pain Frequency: Intermittent Pain Onset: On-going Pain Intervention(s): Medication (See eMAR) ADL: See Care Tool Section for some details of mobility and selfcare   Therapy/Group: Individual Therapy  Lynniah Janoski OTR/L 06/05/2021, 12:22 PM

## 2021-06-05 NOTE — Progress Notes (Signed)
PROGRESS NOTE   Subjective/Complaints:  Pt notes increased low back and RLE pain mainly around knee area.  Prior hx of similar pain which responded to muscle relaxers.  Pt used to go to pain clinic in past and receive injections  Lumbar MRI from 2017 demonstrated spinal stenosis potentially impinging RIght L 3 and L4 nerve root (as well as Left L4)  ROS- neg CP, SOB , N/V/D, +pain  Objective:   No results found. No results for input(s): WBC, HGB, HCT, PLT in the last 72 hours.  No results for input(s): NA, K, CL, CO2, GLUCOSE, BUN, CREATININE, CALCIUM in the last 72 hours.   Intake/Output Summary (Last 24 hours) at 06/05/2021 0850 Last data filed at 06/05/2021 0816 Gross per 24 hour  Intake 240 ml  Output --  Net 240 ml         Physical Exam: Vital Signs Blood pressure 135/80, pulse (!) 59, temperature 98.1 F (36.7 C), temperature source Oral, resp. rate 18, height 5\' 2"  (1.575 m), weight 103.4 kg, SpO2 96 %.   General: No acute distress Mood and affect are appropriate Heart: Regular rate and rhythm no rubs murmurs or extra sounds Lungs: Clear to auscultation, breathing unlabored, no rales or wheezes Abdomen: Positive bowel sounds, soft nontender to palpation, nondistended Extremities: No clubbing, cyanosis, or edema Skin: No evidence of breakdown, no evidence of rash   Sensory exam normal sensation to light touch and proprioception in bilateral upper and lower extremities No sensory loss RLE Cerebellar exam normal finger to nose to finger as well as heel to shin in bilateral upper and lower extremities DTR 4+ RIght patellar  Musculoskeletal: Full range of motion in all 4 extremities. No joint swelling No pain to palpation RLE , neg SLR    Assessment/Plan: 1. Functional deficits which require 3+ hours per day of interdisciplinary therapy in a comprehensive inpatient rehab setting. Physiatrist is providing  close team supervision and 24 hour management of active medical problems listed below. Physiatrist and rehab team continue to assess barriers to discharge/monitor patient progress toward functional and medical goals  Care Tool:  Bathing    Body parts bathed by patient: Chest, Abdomen, Right upper leg, Left upper leg, Face, Left arm, Right arm   Body parts bathed by helper: Front perineal area, Buttocks, Left lower leg, Right lower leg     Bathing assist       Upper Body Dressing/Undressing Upper body dressing   What is the patient wearing?: Bra, Pull over shirt    Upper body assist Assist Level: Maximal Assistance - Patient 25 - 49%    Lower Body Dressing/Undressing Lower body dressing      What is the patient wearing?: Incontinence brief     Lower body assist Assist for lower body dressing: 2 Helpers     Toileting Toileting    Toileting assist Assist for toileting: Maximal Assistance - Patient 25 - 49%     Transfers Chair/bed transfer  Transfers assist     Chair/bed transfer assist level: 2 Helpers (squat pivot)     Locomotion Ambulation   Ambulation assist   Ambulation activity did not occur: Safety/medical concerns  Assist  level: 2 helpers Assistive device: Other (comment) (L hallway rail) Max distance: 10ft   Walk 10 feet activity   Assist  Walk 10 feet activity did not occur: Safety/medical concerns        Walk 50 feet activity   Assist Walk 50 feet with 2 turns activity did not occur: Safety/medical concerns         Walk 150 feet activity   Assist Walk 150 feet activity did not occur: Safety/medical concerns         Walk 10 feet on uneven surface  activity   Assist Walk 10 feet on uneven surfaces activity did not occur: Safety/medical concerns         Wheelchair     Assist Is the patient using a wheelchair?: Yes Type of Wheelchair: Manual    Wheelchair assist level: Dependent - Patient 0%      Wheelchair  50 feet with 2 turns activity    Assist        Assist Level: Dependent - Patient 0%   Wheelchair 150 feet activity     Assist      Assist Level: Dependent - Patient 0%   Blood pressure 135/80, pulse (!) 59, temperature 98.1 F (36.7 C), temperature source Oral, resp. rate 18, height 5\' 2"  (1.575 m), weight 103.4 kg, SpO2 96 %.    Medical Problem List and Plan: 1. 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  Antithrombotics: -DVT/anticoagulation:  Mechanical:  Antiembolism stockings, knee (TED hose) Bilateral lower extremities             -antiplatelet therapy: N/A 3. Lower back pain: kpad added. Tylenol as needed 4. Mood: Provide emotional support             -antipsychotic agents: N/A 5. Neuropsych: This patient is capable of making decisions on her own behalf. 6. Skin/Wound Care: Routine skin checks 7. Fluids/Electrolytes/Nutrition: Routine in and outs with follow-up chemistries 8.  Hypertension.  Uncontrolled. Start magnesium gluconate 250mg  HS and check magnesium level tomorrow morning. Mg++ 2.2-nl Vitals:   06/04/21 1934 06/05/21 0314  BP: (!) 162/81 135/80  Pulse: 72 (!) 59  Resp: 18 18  Temp: 98.4 F (36.9 C) 98.1 F (36.7 C)  SpO2: 98% 96%  IPH with sys BP goal <15mmHg , was on amlodipine 2.5 mg qd PTA will resume and monitor  Increase amlodipine to 5mg  on 1/21  9.  Gram-negative rod UTI.  Completed Keflex- resolved  10.  GERD.  continue Protonix 11.  Hyperlipidemia.  Rosuvastatin 5 mg daily resume at discharge. 12.  Obesity.  BMI 42.05.  Dietary follow-up 13. Constipation: continue magnesium gluconate 250mg  HS.   1/21: Type 5 stool    14.  Hx of lumbar spinal stenosis and spondylosis, has had Lumbar RF in past (2018) , pt reports improvement with muscle  relaxers will trial  LOS: 5 days A FACE TO FACE EVALUATION WAS PERFORMED  Charlett Blake 06/05/2021, 8:50 AM

## 2021-06-05 NOTE — Progress Notes (Signed)
Physical Therapy Session Note  Patient Details  Name: Terri Werner MRN: [id number] Date of Birth: [birth year]  Today's Date: 06/05/2021 PT Individual Time: 1445-1555 PT Individual Time Calculation (min): 70 min   Short Term Goals: Week 1:  PT Short Term Goal 1 (Week 1): Pt will perform supine<>sit with mod assist of 1 PT Short Term Goal 2 (Week 1): Pt will perform sit<>stands using LRAD with mod assist of 1 PT Short Term Goal 3 (Week 1): Pt will perform bed<>chair transfers using LRAD with mod assist of 1 PT Short Term Goal 4 (Week 1): Pt will ambulate at least 42ft using LRAD with +2 assist  Skilled Therapeutic Interventions/Progress Updates:    Patient received sitting up in wc, agreeable to PT. She denies pain. PT transporting patient in wc to therapy gym for time management and energy conservation. Donned LiteGait harness in standing with ModA to stand and B UE support + R knee blocked. Patient with [place] walkon AFO to RLE. MaxA+ to advance R LE and facilitate weight shift L. 2nd person present to assist in facilitating weight shift L to allow for advancement of R LE. Patient with poor proprioceptive awareness of R LE and reported inability to feel whether it's flexed or not. Patient ambulated for 2' 13ft at 0.5 mph. Toward end of gait bout, patient with progressive pushing to the R and limited ability to correct despite max multimodal cues. Patient ambulated additional 1'30" for 54ft at 0.64mph with slightly improved ability to weight shift L. Patient returning to wc. PT donning Sprystep max GRAFO to assist with R knee flexion with significant improvement in midline stance and stability of R knee. Mirror used for visual feedback on posture. RW used for UE support with only slight evidence of pushing to the R. Patient completing 3x12 minisquats within allowable range of AFO. Only toward the end of all sets, with increased fatigue, did pushing increase to where PT had to intervene to  manually facilitate return to midline to sit safely. Patient returning to room in wc, seatbelt alarm on, call light within reach.   Therapy Documentation Precautions:  Precautions Precautions: Fall, Other (comment) Precaution Comments: R hemiparesis, R LE extensor tone Restrictions Weight Bearing Restrictions: No    Therapy/Group: Individual Therapy  Karoline Caldwell, PT, DPT, CBIS  06/05/2021, 7:45 AM

## 2021-06-06 ENCOUNTER — Inpatient Hospital Stay (HOSPITAL_COMMUNITY): Payer: Medicare Other

## 2021-06-06 LAB — CBC WITH DIFFERENTIAL/PLATELET
Abs Immature Granulocytes: 0.01 10*3/uL (ref 0.00–0.07)
Basophils Absolute: 0 10*3/uL (ref 0.0–0.1)
Basophils Relative: 0 %
Eosinophils Absolute: 0.2 10*3/uL (ref 0.0–0.5)
Eosinophils Relative: 3 %
HCT: 38.1 % (ref 36.0–46.0)
Hemoglobin: 12.6 g/dL (ref 12.0–15.0)
Immature Granulocytes: 0 %
Lymphocytes Relative: 21 %
Lymphs Abs: 1.5 10*3/uL (ref 0.7–4.0)
MCH: 29.1 pg (ref 26.0–34.0)
MCHC: 33.1 g/dL (ref 30.0–36.0)
MCV: 88 fL (ref 80.0–100.0)
Monocytes Absolute: 0.7 10*3/uL (ref 0.1–1.0)
Monocytes Relative: 11 %
Neutro Abs: 4.5 10*3/uL (ref 1.7–7.7)
Neutrophils Relative %: 65 %
Platelets: 180 10*3/uL (ref 150–400)
RBC: 4.33 MIL/uL (ref 3.87–5.11)
RDW: 14.6 % (ref 11.5–15.5)
WBC: 7 10*3/uL (ref 4.0–10.5)
nRBC: 0 % (ref 0.0–0.2)

## 2021-06-06 LAB — BASIC METABOLIC PANEL
Anion gap: 6 (ref 5–15)
BUN: 14 mg/dL (ref 8–23)
CO2: 28 mmol/L (ref 22–32)
Calcium: 9 mg/dL (ref 8.9–10.3)
Chloride: 105 mmol/L (ref 98–111)
Creatinine, Ser: 1.07 mg/dL — ABNORMAL HIGH (ref 0.44–1.00)
GFR, Estimated: 56 mL/min — ABNORMAL LOW (ref >60)
Glucose, Bld: 144 mg/dL — ABNORMAL HIGH (ref 70–99)
Potassium: 4.2 mmol/L (ref 3.5–5.1)
Sodium: 139 mmol/L (ref 135–145)

## 2021-06-06 LAB — TROPONIN I (HIGH SENSITIVITY): Troponin I (High Sensitivity): 7 ng/L (ref <18)

## 2021-06-06 MED ORDER — ALUM & MAG HYDROXIDE-SIMETH 200-200-20 MG/5ML PO SUSP
30.0000 mL | Freq: Once | ORAL | Status: AC
  Administered 2021-06-06: 09:00:00 30 mL via ORAL
  Filled 2021-06-06: qty 30

## 2021-06-06 NOTE — Plan of Care (Signed)
°  Problem: Consults Goal: RH STROKE PATIENT EDUCATION Description: See Patient Education module for education specifics  Outcome: Progressing   Problem: RH BOWEL ELIMINATION Goal: RH STG MANAGE BOWEL WITH ASSISTANCE Description: STG Manage Bowel with mod I  Assistance. Outcome: Progressing Goal: RH STG MANAGE BOWEL W/MEDICATION W/ASSISTANCE Description: STG Manage Bowel with Medication with mod I Assistance. Outcome: Progressing   Problem: RH BLADDER ELIMINATION Goal: RH STG MANAGE BLADDER WITH ASSISTANCE Description: STG Manage Bladder With out Assistance Outcome: Progressing   Problem: RH SAFETY Goal: RH STG ADHERE TO SAFETY PRECAUTIONS W/ASSISTANCE/DEVICE Description: STG Adhere to Safety Precautions With cues Assistance/Device. Outcome: Progressing   Problem: RH PAIN MANAGEMENT Goal: RH STG PAIN MANAGED AT OR BELOW PT'S PAIN GOAL Description: At or below level 4 with prn meds Outcome: Progressing   Problem: RH KNOWLEDGE DEFICIT Goal: RH STG INCREASE KNOWLEDGE OF DIABETES Description: Patient and sister will be able to manage prediabetes with dietary modifications using handouts and educational materials independently Outcome: Progressing Goal: RH STG INCREASE KNOWLEDGE OF HYPERTENSION Description: Patient and sister will be able to manage HTN with medications and dietary modifications using handouts and educational materials independently Outcome: Progressing Goal: RH STG INCREASE KNOWLEGDE OF HYPERLIPIDEMIA Description: Patient and sister will be able to manage HLD with medications and dietary modifications using handouts and educational materials independently Outcome: Progressing Goal: RH STG INCREASE KNOWLEDGE OF STROKE PROPHYLAXIS Description: Patient and sister will be able to manage secondary risk with medications and dietary modifications using handouts and educational materials independently Outcome: Progressing

## 2021-06-06 NOTE — Progress Notes (Signed)
Occupational Therapy Session Note  Patient Details  Name: Terri Werner MRN: [id number] Date of Birth: Dec 28, 1950  Today's Date: 06/06/2021 OT Individual Time: 9407-6808 OT Individual Time Calculation (min): 61 min    Short Term Goals: Week 1:  OT Short Term Goal 1 (Week 1): Donn UB clothing items with ModA OT Short Term Goal 2 (Week 1): Patient to complete sit to stand with functional task performance with ModA OT Short Term Goal 3 (Week 1): Patient will complete squat-pivot transfer with MaxAx1 person OT Short Term Goal 4 (Week 1): Pt will attend to RUE in relation to placement during functional task performance  Skilled Therapeutic Interventions/Progress Updates:    Pt in wheelchair to start session, agreeable to therapy.  She was taken down to the therapy gym for session where she transferred to the therapy mat with mod assist squat pivot.  Worked on sit to stand and standing balance with use of the mirror for feedback during session.  She was able to complete sit to stand with mod assist but continues to need max to total assist +2 (pt 40%) for standing balance secondary to increased pushing to the right as well as decreased proprioception in the right knee and motor impersistence.  She also exhibits decrease spatial awareness which results in her over extending her trunk and rotating to the left when attempting to correct her balance.  Worked on stand to squat position as well and attempting to maintain midline with the transitional movement.  She needed max assist to maintain through the movement secondary to increased pushing to the right.  Finished session with squat pivot transfer to the wheelchair with mod assist and return to the room.  She was left sitting up with the call button and phone in reach with safety alarm belt in place.    Therapy Documentation Precautions:  Precautions Precautions: Fall, Other (comment) Precaution Comments: R hemiparesis, R LE extensor  tone Restrictions Weight Bearing Restrictions: No  Pain: See Pain Flowsheet for details    Therapy/Group: Individual Therapy  Terri Werner OTR/L 06/06/2021, 7:35 PM

## 2021-06-06 NOTE — Progress Notes (Signed)
Patient complaining of new onset chest pain during handoff. Patient reports as "pressure" 8/10. Vitals stable. MD Kirsteins notified by oncoming nurse. New orders received.

## 2021-06-06 NOTE — Progress Notes (Signed)
Inpatient Rehabilitation Care Coordinator Assessment and Plan Patient Details  Name: Terri Werner MRN: [id number] Date of Birth: 1950-12-01  Today's Date: 06/06/2021  Hospital Problems: Principal Problem:   Intraparenchymal hemorrhage of brain [facility])  Past Medical History:  Past Medical History:  Diagnosis Date   Arthritis    pt reports in back & knees   Colon polyps 2010   At [facility]   Diverticulitis 2016   Genital warts    GERD (gastroesophageal reflux disease)    Heart murmur    Hepatitis B    History of blood transfusion    during each major surgery per pt   History of chicken pox    History of hiatal hernia    History of torn meniscus of right knee    HTN (hypertension)    Hx of migraine headaches    Hypertension    Hypopotassemia    Past Surgical History:  Past Surgical History:  Procedure Laterality Date   ABDOMINAL HYSTERECTOMY  2006   APPENDECTOMY  1980   CESAREAN SECTION  1981   COLONOSCOPY WITH PROPOFOL N/A 11/01/2014   Procedure: COLONOSCOPY WITH PROPOFOL;  Surgeon: Scot Jun, MD;  Location: [facility] ENDOSCOPY;  Service: Endoscopy;  Laterality: N/A;   COLONOSCOPY WITH PROPOFOL N/A 01/05/2020   Procedure: COLONOSCOPY WITH PROPOFOL;  Surgeon: Regis Bill, MD;  Location: [facility] ENDOSCOPY;  Service: Endoscopy;  Laterality: N/A;   ECTOPIC PREGNANCY SURGERY  1980   HERNIA REPAIR  2006   Umbilical   KNEE ARTHROSCOPY Left 2006   torn meniscus   TONSILLECTOMY  1971   TOTAL ABDOMINAL HYSTERECTOMY W/ BILATERAL SALPINGOOPHORECTOMY  2006   VESICOVAGINAL FISTULA CLOSURE W/ TAH     Social History:  reports that she has never smoked. She has never used smokeless tobacco. She reports current alcohol use. She reports that she does not use drugs.  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 Legal History/Current Legal Issues: n/a Guardian/Conservator: n/a   Abuse/Neglect Abuse/Neglect Assessment Can Be Completed: Yes Physical Abuse: Denies Verbal Abuse: Denies Sexual Abuse: Denies Exploitation of patient/patient's resources: Denies Self-Neglect: Denies Possible abuse reported to:: [place] department of social services  Patient response to: Social Isolation - How often do you feel lonely or isolated from those around you?: Never  Emotional Status Recent Psychosocial Issues: coping Psychiatric History: n/a Substance Abuse History: n/a  Patient / Family Perceptions, Expectations & Goals Pt/Family understanding of illness & functional limitations: yes Premorbid pt/family roles/activities: Previously independent and driving Anticipated changes in roles/activities/participation: family able to assist with roles and tasks at discharge Pt/family expectations/goals: Min A  [employer]: None Premorbid Home Care/DME Agencies: Other (Comment) Adult nurse) Transportation available at discharge: family able to transport Is the patient able to respond to transportation needs?: Yes In the past 12 months, has lack of transportation kept you from medical appointments or from getting medications?: No In the past 12 months, has lack of transportation kept you from meetings, work, or from getting things needed for daily living?: No Resource referrals recommended: Neuropsychology  Discharge Planning Living Arrangements: Alone Support Systems: Children, Other relatives Type of Residence: Private residence Insurance Resources: Media planner (specify) Theatre manager Medicare) Financial Resources:  Social Theatre manager Screen Referred: No Living Expenses: Rent Money Management: Patient Does the patient have any problems obtaining your medications?: Yes (Describe) Home Management: previously independent Patient/Family Preliminary Plans: niece and daughter able to assist Care Coordinator Barriers to Discharge: Insurance for SNF coverage Care Coordinator Anticipated Follow Up Needs: HH/OP Expected length of stay: 14-20 Days  Clinical Impression Sw met with patient introduced self, explained role and addresses questions and concerns.  Andria Rhein 06/06/2021, 11:50 AM

## 2021-06-06 NOTE — Progress Notes (Signed)
Occupational Therapy Session Note  Patient Details  Name: Terri Werner MRN: [id number] Date of Birth: 1950/07/28  Today's Date: 06/06/2021 OT Individual Time: 1000-1100 OT Individual Time Calculation (min): 60 min    Short Term Goals: Week 1:  OT Short Term Goal 1 (Week 1): Donn UB clothing items with ModA OT Short Term Goal 2 (Week 1): Patient to complete sit to stand with functional task performance with ModA OT Short Term Goal 3 (Week 1): Patient will complete squat-pivot transfer with MaxAx1 person OT Short Term Goal 4 (Week 1): Pt will attend to RUE in relation to placement during functional task performance Week 2:     Skilled Therapeutic Interventions/Progress Updates:    Patient seen this A.M. for OT treatment.  Patient seated in bed upon arrival with reported pain level of 4 on o-10 scale for low back pain at the lumbar region.  The pt indicated that she wanted to work on BADL in bathing at [facility] , pt indicated that she needed to go to the restroom prior to bathing. Patient transfer using the stedy with MaxAx2 secondary to an  inability external rotation or dorsal flex foot for lifting the extremity. The pt was total assist for wiping during toileting and required verbal prompts for hand placement and CGA for returning to EOB. The patient was able to bathing her UB and LB with MinA, she required MaxA for her back and feet.  The pt was able to dress her UB with MinA for her bra, fasteners, and pull over top and Max to total assist with her LB secondary to challenges with maneuvering her LE through the legs of her pants and for donning her compression stocking.  The pt was s/u for doff her left shoe, and MaxA for the right shoe. The pt was able to complete simple grooming task for brushing her teeth and combing her hair with s/u A.The pt completed a squat pivot transfer for going from the EOB to the w/c with her bedside table in place, her call light available and all addition  needs addressed.   Therapy Documentation Precautions:  Precautions Precautions: Fall, Other (comment) Precaution Comments: R hemiparesis, R LE extensor tone Restrictions Weight Bearing Restrictions: No General:   Vital Signs: Therapy Vitals Pulse Rate: 73 Resp: 18 BP: 135/73 Patient Position (if appropriate): Sitting Oxygen Therapy SpO2: 96 % O2 Device: Room Air Pain: Pain Assessment Pain Scale: 0-10 Pain Score: 8  Pain Type: Acute pain Pain Location: Chest Pain Descriptors / Indicators: Pressure Pain Onset: Sudden Pain Intervention(s): MD notified (Comment) Vision   Perception    Praxis   Balance   Exercises:   Other Treatments:     Therapy/Group: Individual Therapy  Yvonne Kendall 06/06/2021, 11:14 AM

## 2021-06-06 NOTE — Progress Notes (Signed)
Physical Therapy Session Note  Patient Details  Name: Terri Werner MRN: [id number] Date of Birth: 1950-07-28  Today's Date: 06/06/2021 PT Individual Time: 1125-1205 and 4098-1191 PT Individual Time Calculation (min): 40 min and 45 min  Short Term Goals: Week 1:  PT Short Term Goal 1 (Week 1): Pt will perform supine<>sit with mod assist of 1 PT Short Term Goal 2 (Week 1): Pt will perform sit<>stands using LRAD with mod assist of 1 PT Short Term Goal 3 (Week 1): Pt will perform bed<>chair transfers using LRAD with mod assist of 1 PT Short Term Goal 4 (Week 1): Pt will ambulate at least 60ft using LRAD with +2 assist  Skilled Therapeutic Interventions/Progress Updates:    Session 1: Per chart review, pt c/o of chest pain this AM - confirmed with RN that pt is cleared to participate in therapy session. Pt received sitting in w/c and agreeable to therapy session reporting improvement in her chest pain.  Transported to/from gym in w/c for time management and energy conservation.   Pt already wearing Thu[employer]ne Sprystep max GRAFO on R LE.   Sit>stand w/c>L UE support on hallway rail with heavy mod assist of 1 for balance due to pt having excessive thoracic extension causing posterior lean - therapist also blocking R knee.  Gait training 31ft at L hallway rail with +2 w/c follow and mirror feedback - continues to require heavy max assist of 1 for balance and R LE gait mechanics - pt demos decreased anterior lean, but continues to have poor L weight shift over L LE during stance (possible pushing towards R) though improving - wearing this GRAFO pt does demo improved R knee extension (almost snapping back too quickly into extension) during stance phase but has more impaired R LE external rotation during swing therefore therapist having to correct for this.Guarding R knee during stance but no buckling noted, total assist to advance R LE during swing   Attempted gait training using L HHA from +2  assist requiring heavy +2 mod assist for balance and R LE gait mechanics but pt continues to have worsening R LE external rotation placing LE in an unsafe position to guard during stance therefore returned to sitting in w/c (3rd person providing w/c follow).  Swapped back to Thuasne Sprystep PLS AFO. Gait training 86ft +30ft using L HHA from +2 assist - mirror feedback - requires heavy +2 mod assist for balance and R LE gait mechanics - total assist to advance R LE during swing with intermittent ability to initiate swing - requires more of a knee block on R LE during stance wearing this AFO compared to the GRAFO although able to ensure pt's LE in improved alignment to ensure pt safety - pt continues to have strong R lean requiring heavy assistance from +2 to weight shift L during L stance phase with pt having impaired motor planning on how to achieve this despite mirror feedback and multimodal cuing.  Gait training final trial at L hallway rail ~52ft with continued heavy max assist of 1 and +2 w/c follow as described above - continues to demo above gait deviations through improving but inconsistent.   Transported back to room and left seated in w/c with needs in reach, seat belt alarm on, and R UE supported on 1/2 lap tray.     Session 2: Pt received sitting in w/c and agreeable to therapy session.  Transported to/from gym in w/c for time management and energy conservation.  Donned R LE  Thuasne Sprystep Max GRAFO. Sit>stand w/c>B UE support on litegait handles with mod assist of 1 for balance and guarding R knee that became more flexed with pt's divided attention and over time, requiring more of a block while +2 assist provided total assist to don litegait harness. Stepped forward up onto treadmill while in litegait harness to provide balance and body weight support (BWS) with +2 assist managing litegait and therapist providing total assist for R LE management to step forward and then up onto treadmill  while providing pt step-by-step verbal cuing for sequencing.   Participated in locomotor treadmill training with B UE support on litegait handles and partial BWS for 57min57seconds at 0.69mph totaling 133ft - +2 assist facilitating L weight shift during L stance phase while therapist provided total assist for R LE swing phase advancement as well as facilitating knee extension during stance while avoiding hyperextension nor flexed knee - verbal cuing throughout for increased L step length to achieve reciprocal stepping, which pt was successful with <25% of the time.   Stepped back off treadmill with B UE support on litegait handles and +2 to assist with litegait management while therapist provided total assist for stepping and managing R LE positioning. Doffed harness. Transported back to room and left seated in w/c with needs in reach, chair alarm on, and R UE supported on 1/2 lap tray.   Therapy Documentation Precautions:  Precautions Precautions: Fall, Other (comment) Precaution Comments: R hemiparesis, R LE extensor tone Restrictions Weight Bearing Restrictions: No   Pain: Session 1: Reports the "flexeril" has improved her back pain - no further interventions needed at this time.  Session 2: Reports "pinch" type pain in her lower back rated as 8/10 that radiates down R LE to her "mid-calf" - pt states this is similar to her chronic pain; however, appears to be experiencing more pain while performing gait training on treadmill as opposed to overground, but will continue to assess.     Therapy/Group: Individual Therapy  Ginny Forth , PT, DPT, NCS, CSRS  06/06/2021, 8:00 AM

## 2021-06-06 NOTE — Progress Notes (Signed)
PROGRESS NOTE   Subjective/Complaints:  Called by RN, pt c/o 8/10 CP, non radiating starting this am.  Mild SOB, no radiating discomfort.  Vitals unchanged.  Denies heartburn but pain dropped to 5/10 with sitting, denied pain during therapy yesterday  ROS- neg CP, SOB , N/V/D, +pain  Objective:   No results found. No results for input(s): WBC, HGB, HCT, PLT in the last 72 hours.  No results for input(s): NA, K, CL, CO2, GLUCOSE, BUN, CREATININE, CALCIUM in the last 72 hours.   Intake/Output Summary (Last 24 hours) at 06/06/2021 0759 Last data filed at 06/05/2021 1802 Gross per 24 hour  Intake 480 ml  Output --  Net 480 ml         Physical Exam: Vital Signs Blood pressure 135/73, pulse 73, temperature 98.3 F (36.8 C), temperature source Oral, resp. rate 18, height 5\' 2"  (1.575 m), weight 103.4 kg, SpO2 96 %.    General: No acute distress Mood and affect are appropriate Heart: Regular rate and rhythm no rubs murmurs or extra sounds Lungs: Clear to auscultation, breathing unlabored, no rales or wheezes Abdomen: Positive bowel sounds, soft nontender to palpation, nondistended, no epigastric tenderness Extremities: No clubbing, cyanosis, or edema, no calf tenderness   Sensory exam normal sensation to light touch and proprioception in bilateral upper and lower extremities No sensory loss RLE Cerebellar exam normal finger to nose to finger as well as heel to shin in bilateral upper and lower extremities DTR 4+ RIght patellar  Musculoskeletal: Full range of motion in all 4 extremities. No joint swelling No pain to palpation RLE , No pain to palpation over sternal area    Assessment/Plan: 1. Functional deficits which require 3+ hours per day of interdisciplinary therapy in a comprehensive inpatient rehab setting. Physiatrist is providing close team supervision and 24 hour management of active medical problems listed  below. Physiatrist and rehab team continue to assess barriers to discharge/monitor patient progress toward functional and medical goals  Care Tool:  Bathing    Body parts bathed by patient: Chest, Abdomen, Right upper leg, Left upper leg, Face, Left arm, Right arm   Body parts bathed by helper: Front perineal area, Buttocks, Left lower leg, Right lower leg     Bathing assist       Upper Body Dressing/Undressing Upper body dressing   What is the patient wearing?: Bra, Pull over shirt    Upper body assist Assist Level: Maximal Assistance - Patient 25 - 49%    Lower Body Dressing/Undressing Lower body dressing      What is the patient wearing?: Incontinence brief     Lower body assist Assist for lower body dressing: 2 Helpers     Toileting Toileting    Toileting assist Assist for toileting: Maximal Assistance - Patient 25 - 49%     Transfers Chair/bed transfer  Transfers assist     Chair/bed transfer assist level: 2 Helpers (squat pivot)     Locomotion Ambulation   Ambulation assist   Ambulation activity did not occur: Safety/medical concerns  Assist level: 2 helpers Assistive device: Other (comment) (L hallway rail) Max distance: 70ft   Walk 10 feet activity  Assist  Walk 10 feet activity did not occur: Safety/medical concerns        Walk 50 feet activity   Assist Walk 50 feet with 2 turns activity did not occur: Safety/medical concerns         Walk 150 feet activity   Assist Walk 150 feet activity did not occur: Safety/medical concerns         Walk 10 feet on uneven surface  activity   Assist Walk 10 feet on uneven surfaces activity did not occur: Safety/medical concerns         Wheelchair     Assist Is the patient using a wheelchair?: Yes Type of Wheelchair: Manual    Wheelchair assist level: Dependent - Patient 0%      Wheelchair 50 feet with 2 turns activity    Assist        Assist Level:  Dependent - Patient 0%   Wheelchair 150 feet activity     Assist      Assist Level: Dependent - Patient 0%   Blood pressure 135/73, pulse 73, temperature 98.3 F (36.8 C), temperature source Oral, resp. rate 18, height 5\' 2"  (1.575 m), weight 103.4 kg, SpO2 96 %.    Medical Problem List and Plan: 1. 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  Antithrombotics: -DVT/anticoagulation:  Mechanical:  Antiembolism stockings, knee (TED hose) Bilateral lower extremities             -antiplatelet therapy: N/A 3. Lower back pain: kpad added. Tylenol as needed 4. Mood: Provide emotional support             -antipsychotic agents: N/A 5. Neuropsych: This patient is capable of making decisions on her own behalf. 6. Skin/Wound Care: Routine skin checks 7. Fluids/Electrolytes/Nutrition: Routine in and outs with follow-up chemistries 8.  Hypertension.  Uncontrolled. Start magnesium gluconate 250mg  HS and check magnesium level tomorrow morning. Mg++ 2.2-nl Vitals:   06/06/21 0508 06/06/21 0746  BP: (!) 147/84 135/73  Pulse: 63 73  Resp: 17 18  Temp: 98.3 F (36.8 C)   SpO2: 100% 96%  IPH with sys BP goal <142mmHg , was on amlodipine 2.5 mg qd PTA will resume and monitor  Increase amlodipine to 5mg  on 1/21  9.  Gram-negative rod UTI.  Completed Keflex- resolved  10.  GERD.  continue Protonix 11.  Hyperlipidemia.  Rosuvastatin 5 mg daily resume at discharge. 12.  Obesity.  BMI 42.05.  Dietary follow-up 13. Constipation: continue magnesium gluconate 250mg  HS.   1/21: Type 5 stool    14.  Hx of lumbar spinal stenosis and spondylosis, has had Lumbar RF in past (2018) , pt reports improvement with muscle relaxers will trial  15.  Chest pain at rest some improvement with sitting position, pt denies  hx of reflux but is on protonix , EKG 06/06/21 unchanged vs 05/27/21, in fact T waves normalized Await troponin, if ok may resume therapy Will give maalox 22ml x 1 given suspicion of reflux  LOS: 6 days A FACE TO FACE EVALUATION WAS PERFORMED  Charlett Blake 06/06/2021, 7:59 AM

## 2021-06-07 ENCOUNTER — Inpatient Hospital Stay (HOSPITAL_COMMUNITY): Payer: Medicare Other

## 2021-06-07 DIAGNOSIS — M7989 Other specified soft tissue disorders: Secondary | ICD-10-CM

## 2021-06-07 DIAGNOSIS — M79604 Pain in right leg: Secondary | ICD-10-CM

## 2021-06-07 NOTE — Progress Notes (Signed)
Physical Therapy Session Note  Patient Details  Name: Terri Werner MRN: [id number] Date of Birth: 1950/10/22  Today's Date: 06/07/2021 PT Individual Time: 1130-1157 PT Individual Time Calculation (min): 27 min   Short Term Goals: Week 1:  PT Short Term Goal 1 (Week 1): Pt will perform supine<>sit with mod assist of 1 PT Short Term Goal 2 (Week 1): Pt will perform sit<>stands using LRAD with mod assist of 1 PT Short Term Goal 3 (Week 1): Pt will perform bed<>chair transfers using LRAD with mod assist of 1 PT Short Term Goal 4 (Week 1): Pt will ambulate at least 69ft using LRAD with +2 assist  Skilled Therapeutic Interventions/Progress Updates:     Pt sitting in w/c to start session. Agreeable to PT tx. Wheeled to [facility] rehab gym to work on [employer] table but unfortunately this was being used with other pt/PT. Wheeled to hallway to work on static standing balance, postural control/awareness training, sit<>stand transitions, and some pre-gait tasks. Sit<>stand using L hand rail with min/modA and requires modA for initial standing balance due to R lean and poor postural control. Standing posture shows significant lumbar lordosis and L pelvic rotation. Educated her on engaging abdominals/core musculature to assist with lumbar support and standing control. Practiced this in static standing while providing tactile feedback to abdominals to fire, which she responded well with. Attempted to progress this with pre-gait tasks for forward/backward stepping on L while engaging core but she still required modA for balance due to above listed impairments. Pt began to c/o "10/10" R calf pain while completing standing tasks. She reports that the MD is aware and planned for [place]. Assessed HR which was in the 70s and provided rest break for pian management. Discussed possible need for PRAFO vs adjustable night splint for her R ankle to prevent ankle ROM restrictions - relayed to primary PT. Pt concluded  session seated in w/c with safety belt alarm on and all immediate needs within reach. Reports improvement in R calf pain while resting, unrated.    Therapy Documentation Precautions:  Precautions Precautions: Fall, Other (comment) Precaution Comments: R hemiparesis, R LE extensor tone Restrictions Weight Bearing Restrictions: No General:    Therapy/Group: Individual Therapy  Alger Simons 06/07/2021, 7:29 AM

## 2021-06-07 NOTE — Patient Care Conference (Signed)
Inpatient RehabilitationTeam Conference and Plan of Care Update Date: 06/07/2021   Time: 10:48 AM    Patient Name: Terri Werner      Medical Record Number: [id number]  Date of Birth: Apr 26, 1951 Sex: Female         Room/Bed: 5C07C/5C07C-01 Payor Info: Payor: [employer]ICARE / Plan: BCBS MEDICARE / Product Type: *No Product type* /    Admit Date/Time:  05/31/2021 12:36 PM  Primary Diagnosis:  Intraparenchymal hemorrhage of brain [facility])  Hospital Problems: Principal Problem:   Intraparenchymal hemorrhage of brain [facility])    Expected Discharge Date: Expected Discharge Date: 06/27/21  Team Members Present: Physician leading conference: Dr. Claudette Laws Social Worker Present: Lavera Guise, BSW Nurse Present: Chana Bode, RN PT Present: Casimiro Needle, PT OT Present: Perrin Maltese, OT SLP Present: Eilene Ghazi, SLP PPS Coordinator present : Fae Pippin, SLP     Current Status/Progress Goal Weekly Team Focus  Bowel/Bladder   cont of B/B, last BM-1/22  maintain cont., regular BM  assess q shift and PRN   Swallow/Nutrition/ Hydration             ADL's   Pt completes UB selfcare at supervision level.  LB bathing and dressing are at max assist sit to stand.  Increased pushing is noted to the right in standing and with transitional movements.  Transfers are max assist stand pivot with mod assist for squat pivot.  She is able to use the RUE at a diminshed level currently  min assist level  selfcare retraining, transfer retraining, DME education, balance retraining, neuromuscular re-education   Mobility   max assist bed mobility, mod assist squat pivot transfers, mod/max assist sit<>stand, max assist gait 61ft at L hallway rail with +2 w/c follow wearing RLE AFO requiring total assist for R swing phase and max assist for R stance phase  - continues to demo significantly impaired proprioceptive spatial awareness with pushing towards R  min assist  overall at ambulatory level  pt education, activity tolerance, R LE NMR, dynamic standing balance, midline orientation and weight shifting, gait training, transfer training   Communication             Safety/Cognition/ Behavioral Observations            Pain   c/o general pain, back pain(4 of 10).pain manag. in place-effective  pain<3  assess pain q shift and PRN   Skin   intact  remain intact  assess skin q shift and PRN     Discharge Planning:  discharging with assistance from sister, niece and daughter. Sister and niece able to [facility] 27/7 supervision avaliable   Team Discussion: Patient with calf pain and chest pain on 06/06/21 appears to be gastric in nature. EKG and labs WNL.  Progress limited by spatial orientation issues and knee flexion. Patient on target to meet rehab goals: yes, currently needs mod assist for lower body bathing and total assist  +2 for dressing. Needs mod assist for squat pivot transfers and stand pivot with total assist. Goals for discharge set for min assist overall.   *See Care Plan and progress notes for long and short-term goals.   Revisions to Treatment Plan:  N/A  Teaching Needs: Safety, medication management, dietary modification recommendations, transfers, toileting, etc  Current Barriers to Discharge: Decreased caregiver support  Possible Resolutions to Barriers: Family education with sister/niece and daughter     Medical Summary Current Status: CP related to GI issues  Barriers to Discharge: Medical  stability   Possible Resolutions to [employer] Focus: complete rehab program short stay d/c to home after family training   Continued Need for Acute Rehabilitation Level of Care: The patient requires daily medical management by a physician with specialized training in physical medicine and rehabilitation for the following reasons: Direction of a multidisciplinary physical rehabilitation program to maximize functional independence :  Yes Medical management of patient stability for increased activity during participation in an intensive rehabilitation regime.: Yes Analysis of laboratory values and/or radiology reports with any subsequent need for medication adjustment and/or medical intervention. : Yes   I attest that I was present, lead the team conference, and concur with the assessment and plan of the team.   Chana Bode B 06/07/2021, 11:22 AM

## 2021-06-07 NOTE — Progress Notes (Signed)
Lower extremity venous RT study completed.  Preliminary results relayed to Neoma Laming, RN via secure chat.  See CV Proc for preliminary results report.   Darlin Coco, RDMS, RVT

## 2021-06-07 NOTE — Progress Notes (Signed)
PROGRESS NOTE   Subjective/Complaints: No further CP, had relief with maalox  Labs normal yesterday CBC and BMET  ROS- neg CP, SOB , N/V/D, +pain  Objective:   No results found. Recent Labs    06/06/21 0808  WBC 7.0  HGB 12.6  HCT 38.1  PLT 180    Recent Labs    06/06/21 0808  NA 139  K 4.2  CL 105  CO2 28  GLUCOSE 144*  BUN 14  CREATININE 1.07*  CALCIUM 9.0     Intake/Output Summary (Last 24 hours) at 06/07/2021 0837 Last data filed at 06/07/2021 0717 Gross per 24 hour  Intake 238 ml  Output --  Net 238 ml         Physical Exam: Vital Signs Blood pressure (!) 148/81, pulse 68, temperature 97.8 F (36.6 C), temperature source Oral, resp. rate 18, height _0  (1.575 m), weight 103.4 kg, SpO2 93 %.    General: No acute distress Mood and affect are appropriate Heart: Regular rate and rhythm no rubs murmurs or extra sounds Lungs: Clear to auscultation, breathing unlabored, no rales or wheezes Abdomen: Positive bowel sounds, soft nontender to palpation, nondistended, no epigastric tenderness Extremities: No clubbing, cyanosis, or edema, no calf tenderness   Sensory exam normal sensation to light touch and proprioception in bilateral upper and lower extremities No sensory loss RLE Cerebellar exam normal finger to nose to finger as well as heel to shin in bilateral upper and lower extremities DTR 4+ RIght patellar  Musculoskeletal: Full range of motion in all 4 extremities. No joint swelling No pain to palpation RLE , No pain to palpation over sternal area    Assessment/Plan: 1. Functional deficits which require 3+ hours per day of interdisciplinary therapy in a comprehensive inpatient rehab setting. Physiatrist is providing close team supervision and 24 hour management of active medical problems listed below. Physiatrist and rehab team continue to assess barriers to discharge/monitor patient  progress toward functional and medical goals  Care Tool:  Bathing    Body parts bathed by patient: Chest, Abdomen, Right upper leg, Left upper leg, Face, Left arm, Right arm   Body parts bathed by helper: Front perineal area, Buttocks, Left lower leg, Right lower leg     Bathing assist       Upper Body Dressing/Undressing Upper body dressing   What is the patient wearing?: Bra, Pull over shirt    Upper body assist Assist Level: Maximal Assistance - Patient 25 - 49%    Lower Body Dressing/Undressing Lower body dressing      What is the patient wearing?: Incontinence brief     Lower body assist Assist for lower body dressing: 2 Helpers     Toileting Toileting    Toileting assist Assist for toileting: Maximal Assistance - Patient 25 - 49%     Transfers Chair/bed transfer  Transfers assist     Chair/bed transfer assist level: 2 Helpers (squat pivot)     Locomotion Ambulation   Ambulation assist   Ambulation activity did not occur: Safety/medical concerns  Assist level: 2 helpers (if using B HHA requires 3rd person w/c follow) Assistive device: Other (comment) (+2 HHA  or L hallway rail) Max distance: 40f   Walk 10 feet activity   Assist  Walk 10 feet activity did not occur: Safety/medical concerns        Walk 50 feet activity   Assist Walk 50 feet with 2 turns activity did not occur: Safety/medical concerns         Walk 150 feet activity   Assist Walk 150 feet activity did not occur: Safety/medical concerns         Walk 10 feet on uneven surface  activity   Assist Walk 10 feet on uneven surfaces activity did not occur: Safety/medical concerns         Wheelchair     Assist Is the patient using a wheelchair?: Yes Type of Wheelchair: Manual    Wheelchair assist level: Dependent - Patient 0%      Wheelchair 50 feet with 2 turns activity    Assist        Assist Level: Dependent - Patient 0%   Wheelchair 150  feet activity     Assist      Assist Level: Dependent - Patient 0%   Blood pressure (!) 148/81, pulse 68, temperature 97.8 F (36.6 C), temperature source Oral, resp. rate 18, height _0  (1.575 m), weight 103.4 kg, SpO2 93 %.    Medical Problem List and Plan: 1. 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 Formulized individual treatment plan based on medical history, underlying problem and comorbidities.              Continue CIR PT, OT 2.  Antithrombotics: -DVT/anticoagulation:  Mechanical:  Antiembolism stockings, knee (TED hose) Bilateral lower extremities             -antiplatelet therapy: N/A 3. Lower back pain: kpad added. Tylenol as needed 4. Mood: Provide emotional support             -antipsychotic agents: N/A 5. Neuropsych: This patient is capable of making decisions on her own behalf. 6. Skin/Wound Care: Routine skin checks 7. Fluids/Electrolytes/Nutrition: Routine in and outs with follow-up chemistries 8.  Hypertension.  Uncontrolled. Start magnesium gluconate 2557mHS and check magnesium level tomorrow morning. Mg++ 2.2-nl Vitals:   06/06/21 2004 06/07/21 0452  BP: (!) 179/90 (!) 148/81  Pulse: 82 68  Resp: 18 18  Temp: 98.6 F (37 C) 97.8 F (36.6 C)  SpO2: 98% 93%  IPH with sys BP goal <160106m , was on amlodipine 2.5 mg qd PTA  Increase amlodipine to 5mg33m 1/21, if pt still elevated tomorrow would rec increase to 7.5mg 71m 9.  Gram-negative rod UTI.  Completed Keflex- resolved  10.  GERD.  continue Protonix 11.  Hyperlipidemia.  Rosuvastatin 5 mg daily resume at discharge. 12.  Obesity.  BMI 42.05.  Dietary follow-up 13. Constipation: continue magnesium  gluconate 250mg 24m  1/21: Type 5 stool    14.  Hx of lumbar spinal stenosis and spondylosis, has had Lumbar RF in past (2018) , pt reports improvement with muscle relaxers will trial  15.  Chest pain at rest (now resolved) some improvement with sitting position, pt denies hx of  reflux but is on protonix , EKG 06/06/21 unchanged vs 05/27/21, in fact T waves normalized  Troponinx 2, neg Had relief with Maalox likely GI  LOS: 7 days A FACE TO FACE EVALUATION WAS PERFORMED  Charlett Blake 06/07/2021, 8:37 AM

## 2021-06-07 NOTE — Progress Notes (Signed)
Occupational Therapy Weekly Progress Note  Patient Details  Name: Terri Werner MRN: [id number] Date of Birth: [birth year]  Beginning of progress report period: June 01, 2021 End of progress report period: June 07, 2021  Today's Date: 06/07/2021 OT Individual Time: 0800-0903 OT Individual Time Calculation (min): 63 min    Patient has met 4 of 4 short term goals.   Pt is making steady progress with OT at this time.  She is able to complete UB selfcare with supervision in sitting.  LB bathing is at a mod to max assist level in the shower with use of the grab bar for support.  She continues to need total +2 (pt 30%) for LB dressing sit to stand.  She demonstrates increased right pushing and lean in sitting which increases in standing.  Increased motor impersistence also noted as well with decreased ability to keep the right knee and hip extended.  Transfers are at a mod assist squat pivot with total assist for stand pivot secondary to not being able to adequately progress the RLE.  RUE functional use is currently at a diminshed level with min assist for dressing tasks.  Overall, feel she is making steady progress but is limited at this time secondary spatial orientation deficits as well as RUE and RLE weakness.  Recommend continued CIR level therapy for continued progression toward min assist level goals with ongoing pt/family education.    Patient continues to demonstrate the following deficits: muscle weakness and muscle paralysis, impaired timing and sequencing, abnormal tone, unbalanced muscle activation, and decreased coordination, decreased attention to right, decreased problem solving and decreased memory, and decreased standing balance, decreased postural control, hemiplegia, and decreased balance strategies and therefore will continue to benefit from skilled OT intervention to enhance overall performance with BADL and Reduce care partner burden.  Patient progressing toward long  term goals..  Continue plan of care.  OT Short Term Goals Week 2:  OT Short Term Goal 1 (Week 2): Patient will complete stand-pivot transfer with MaxAx1 person to the 3:1. OT Short Term Goal 2 (Week 2): Pt will donning underpants and pants with mod assist sit to stand. OT Short Term Goal 3 (Week 2): Pt will complete LB bathing sit to stand with mod assist sit to stand in the shower.  Skilled Therapeutic Interventions/Progress Updates:    Session 1: [phone])  Pt worked on toileting, bathing, and dressing during session.  Mod assist for transfer from supine to sit EOB with use of the Stedy utilized for transfer to the 3:1 over the toilet secondary to urgency.  She was able to complete sit to stand on the Gerty with mod assist.  She needed mod assist for clothing management in the [place] as well.  Once toileting was complete, therapist used the Stedy to transport her over to the tub bench where she sat and was assisted at [facility] assist level for scooting back on the seat.  She was able to then complete doffing her UB clothing with supervision and LB clothing at max assist level.  Bathing was completed at supervision level for UB and max assist level for LB secondary to right knee buckling and motor impersistence.  She was able to complete drying off with total assist transfer to the wheelchair from the edge of the tub bench.  She then worked on [employer] dressing with min assist to [employer] and shirt.  She was able to complete LB dressing with mod instructional cueing and overall total +2 (pt  30%) for sit to stand when donning underpants and pants.  She needed total assist for donning TEDs and shoes.  Call button and phone in reach with safety alarm belt in place.  Pt with increased right calf pain noted to pressure if therapist placed his hand behind her leg to help her move it.  MD aware and test confirmed for further evaluation.    Session 2: [phone])  Pt completed work on sit to stand transitions and  standing in the therapy gym.  Max assist for sit to stand with total +2 (pt 30%) to remain standing secondary to motor impersistence and pushing to the right side.  Decreased spatial orientation as well as she would fall forward with her hips and pelvis with inability to correct it without +2 assistance.  Worked on standing with mirror for feedback with emphasis on trying to complete weightshift to the left.  Increased pushing still noted unless pt was given target to reach for on the left.  Slight flexor tone also noted in the LLE as well with resistance to pushing to the right.  She was able to complete squat pivot transfers to the therapy mat and the bed from the wheelchair at mod assist squat pivot.  Call button and phone in reach at end of session.    Therapy Documentation Precautions:  Precautions Precautions: Fall, Other (comment) Precaution Comments: R hemiparesis, R LE extensor tone Restrictions Weight Bearing Restrictions: No  Pain: Pain Assessment Pain Scale: Faces Pain Score: 0-No pain Faces Pain Scale: Hurts a little bit Pain Type: Acute pain Pain Location: Calf Pain Orientation: Right Pain Descriptors / Indicators: Discomfort Pain Onset: With Activity Patients Stated Pain Goal: 0 Pain Intervention(s): Repositioned;Emotional support ADL:    Therapy/Group: Individual Therapy  Morris Markham OTR/L 06/07/2021, 10:43 AM

## 2021-06-07 NOTE — Progress Notes (Signed)
Patient ID: Terri Werner, female   DOB: [birth year], 71 y.o.   MRN: [id number]  Team Conference Report to Patient/Family  Team Conference discussion was reviewed with the patient and caregiver, including goals, any changes in plan of care and target discharge date.  Patient and caregiver express understanding and are in agreement.  The patient has a target discharge date of 06/27/21.  Sw met with patient and called daughter at bedside. Provided conference updates. No additional questions or concerns, sw will continue to follow up  Dyanne Iha 06/07/2021, 2:18 PM

## 2021-06-07 NOTE — Progress Notes (Signed)
Physical Therapy Session Note  Patient Details  Name: Terri Werner MRN: [id number] Date of Birth: 24-May-1950  Today's Date: 06/07/2021 PT Individual Time: 1303-1405 PT Individual Time Calculation (min): 62 min   Short Term Goals: Week 1:  PT Short Term Goal 1 (Week 1): Pt will perform supine<>sit with mod assist of 1 PT Short Term Goal 2 (Week 1): Pt will perform sit<>stands using LRAD with mod assist of 1 PT Short Term Goal 3 (Week 1): Pt will perform bed<>chair transfers using LRAD with mod assist of 1 PT Short Term Goal 4 (Week 1): Pt will ambulate at least 60ft using LRAD with +2 assist  Skilled Therapeutic Interventions/Progress Updates:    Pt received sitting in w/c and agreeable to therapy session. Pt already wearing B LE TED hose. Transported to/from gym in w/c for time management and energy conservation. MD aware of pt's R calf pain.  Pt continuing to report R calf "burning" pain - therapist provided gentle ankle DF stretch x1 minute prior to donning [place] Sprystep PLS AFO total assist. Pt agreeable to continue with session and to notify therapist if need for rest break.  Sit>stands to L hallway rail or L HHA with only light mod assist of 1 for balance due to posterior lean - pt demos improved ability to rise to stand with anterior trunk lean and guarding R knee but no buckle.  Gait training 2x at L hallway rail with max assist of 1 for balance and R LE management with +2 w/c follow - pt demos improved upright posture with improved L weight shift onto stance limb though continues to require mod assist for balance (only a few instance of excessive anterior lean/LOB) - pt continues to require total assist for R LE advancement during swing however pt with increasing initiation of this - pt demos improved R knee control during stance requiring only a guard with intermittent min/mod assist for safety - cuing throughout to achieve reciprocal stepping pattern, which pt was  successful with >75% of the time.  Gait training ~35-86ft x3 with +2 for HHA and 3rd person w/c follow to target weight shifting at hips as opposed to compensating with use of hallway rail - continuing to require mod/max assist from therapist for balance and R LE management with +2 also providing min/mod assist for balance - continued cuing to focus on L weight shift onto stance limb while avoiding hip drop - pt able to initiate advancement of R LE though continues to require total assist to bring through - pt requires more consistent knee block for safety during this as well as therapist facilitating R weight shift onto stance to promote increased L step length for reciprocal pattern, which pt achieved <35% of the time.   Doffed AFO. Transported back to room and left seated in w/c with needs in reach, R UE supported on 1/2 lap tray, and seat belt alarm on.    Therapy Documentation Precautions:  Precautions Precautions: Fall, Other (comment) Precaution Comments: R hemiparesis, R LE extensor tone Restrictions Weight Bearing Restrictions: No   Pain: Reports R calf pain - provided 1x gentle stretch for 1 minute but otherwise pt reports pain tolerable and able to continue with session.   Therapy/Group: Individual Therapy  Tawana Scale , PT, DPT, NCS, CSRS 06/07/2021, 12:28 PM

## 2021-06-08 MED ORDER — GABAPENTIN 600 MG PO TABS
300.0000 mg | ORAL_TABLET | Freq: Every day | ORAL | Status: DC
  Administered 2021-06-08: 300 mg via ORAL
  Filled 2021-06-08: qty 1

## 2021-06-08 NOTE — Progress Notes (Signed)
Occupational Therapy Session Note  Patient Details  Name: Terri Werner MRN: [id number] Date of Birth: 1950-10-23  Today's Date: 06/08/2021 OT Group Time: 1433-1530 OT Group Time Calculation (min): 57 min   Short Term Goals: Week 2:  OT Short Term Goal 1 (Week 2): Patient will complete stand-pivot transfer with MaxAx1 person to the 3:1. OT Short Term Goal 2 (Week 2): Pt will donning underpants and pants with mod assist sit to stand. OT Short Term Goal 3 (Week 2): Pt will complete LB bathing sit to stand with mod assist sit to stand in the shower.  Skilled Therapeutic Interventions/Progress Updates:  Pt participated in group session with a focus on stress mgmt, education on healthy coping strategies, and social interaction. Focus of session on providing coping strategies to manage new current level of function as a result of new diagnosis.  Session focus on breaking down stressors into daily hassles, major life stressors and life circumstances in an effort to allow pts to chunk their stressors into groups. Pt actively sharing stressors and contributing to group conversation. Provided active listening, emotional support and therapeutic use of self. Offered education on factors that protect [place] against stress such as daily uplifts, healthy coping strategies and protective factors. Encouraged all group members to make an effort to actively recall one event from their day that was a daily uplift in an effort to protect their mindset from stressors as well as sharing this information with their caregivers to facilitate improved caregiver communication and decrease overall burden of care.  Issued pt handouts on healthy coping strategies to implement into routine. Pt transported back to room by this OTA where pt completed sit<>stand to stedy for toilet transfer with MOD A. Total A stedy transfer to toilet, NT enter to assist pt with toileting.   Therapy Documentation Precautions:   Precautions Precautions: Fall, Other (comment) Precaution Comments: R hemiparesis, R LE extensor tone Restrictions Weight Bearing Restrictions: No  Pain: no pain reported during session     Therapy/Group: Group Therapy  Precious Haws 06/08/2021, 4:18 PM

## 2021-06-08 NOTE — Progress Notes (Signed)
At 2048 patient was found sitting on the floor, r chair (recliner)behind her. Patient alert and responsive x 4. Pt reported that she was fixing her "new shoes"- ankle foot orthesis and she slipped from the chair. Patient denies hitting her head, denies any pain. Skin is intact. Patient was safely transferred to bed by 4 nurses. On call NP was made aware, no new orders. Continue to monitor patient for any new changes. Patient comfortably sleeping at this time. Pt refused to notify family tonight, she will call daughter tomorrow. Writer spoke to patient regarding to use safety belt when she is sitting on a chair and she agreed .

## 2021-06-08 NOTE — Progress Notes (Signed)
PROGRESS NOTE   Subjective/Complaints: Discussed her new DVT and not adding anticoagulation given her brain hemorrhage. She asks how this will impact her therapy Discussed repeating US 1 week  ROS- denies CP, SOB , N/V/D, +pain  Objective:   VAS [place] LOWER EXTREMITY VENOUS (DVT)  Result Date: 06/07/2021  Lower Venous DVT Study Patient Name:  Terri Werner  Date of Exam:   06/07/2021 Medical Rec #: [id number]                  Accession #:    [id number] Date of Birth: 05-16-1950                  Patient Gender: F Patient Age:   71 years Exam Location:  [facility] Procedure:      VAS [place] LOWER EXTREMITY VENOUS (DVT) Referring Phys: Claudette Laws --------------------------------------------------------------------------------  Indications: Right lower extremity pain and swelling.  Comparison Study: No prior studies. Performing Technologist: Jean Rosenthal RDMS, RVT  Examination Guidelines: A complete evaluation includes B-mode imaging, spectral Doppler, color Doppler, and power Doppler as needed of all accessible portions of each vessel. Bilateral testing is considered an integral part of a complete examination. Limited examinations for reoccurring indications may be performed as noted. The reflux portion of the exam is performed with the patient in reverse Trendelenburg.  +---------+---------------+---------+-----------+----------+--------------+  RIGHT     Compressibility Phasicity Spontaneity Properties Thrombus Aging  +---------+---------------+---------+-----------+----------+--------------+  CFV       Full            Yes       Yes                                    +---------+---------------+---------+-----------+----------+--------------+  SFJ       Full                                                             +---------+---------------+---------+-----------+----------+--------------+  FV Prox   Full                                                              +---------+---------------+---------+-----------+----------+--------------+  FV Mid    Full                                                             +---------+---------------+---------+-----------+----------+--------------+  FV Distal Full                                                             +---------+---------------+---------+-----------+----------+--------------+  PFV       Full                                                             +---------+---------------+---------+-----------+----------+--------------+  POP       Full            Yes       Yes                                    +---------+---------------+---------+-----------+----------+--------------+  PTV       None            No        No                     Acute           +---------+---------------+---------+-----------+----------+--------------+  PERO      None            No        No                     Acute           +---------+---------------+---------+-----------+----------+--------------+  Gastroc   Full                                                             +---------+---------------+---------+-----------+----------+--------------+   +----+---------------+---------+-----------+----------+--------------+  LEFT Compressibility Phasicity Spontaneity Properties Thrombus Aging  +----+---------------+---------+-----------+----------+--------------+  CFV  Full            Yes       Yes                                    +----+---------------+---------+-----------+----------+--------------+    Summary: LEFT: - Findings consistent with acute deep vein thrombosis involving the left posterior tibial veins, and left peroneal veins. - No cystic structure found in the popliteal fossa.  *See table(s) above for measurements and observations. Electronically signed by Coral Else MD on 06/07/2021 at 9:15:28 PM.    Final    Recent Labs    06/06/21 0808  WBC 7.0  HGB 12.6  HCT 38.1  PLT 180   Recent  Labs    06/06/21 0808  NA 139  K 4.2  CL 105  CO2 28  GLUCOSE 144*  BUN 14  CREATININE 1.07*  CALCIUM 9.0    Intake/Output Summary (Last 24 hours) at 06/08/2021 1746 Last data filed at 06/08/2021 1600 Gross per 24 hour  Intake 704 ml  Output 1 ml  Net 703 ml        Physical Exam: Vital Signs Blood pressure (!) 148/77, pulse 71, temperature 98 F (36.7 C), temperature source Oral, resp. rate 16, height 5\' 2"  (1.575 m), weight 103.2 kg, SpO2 100 %.  Gen: no distress, normal appearing HEENT: oral mucosa pink and moist, NCAT Cardio: Reg rate Chest: normal effort, normal rate of breathing Abd: soft,  non-distended Ext: no edema Psych: pleasant, normal affect Skin: intact Neuro: Sensory exam normal sensation to light touch and proprioception in bilateral upper and lower extremities No sensory loss RLE Cerebellar exam normal finger to nose to finger as well as heel to shin in bilateral upper and lower extremities DTR 4+ RIght patellar  Musculoskeletal: Full range of motion in all 4 extremities. No joint swelling No pain to palpation RLE , No pain to palpation over sternal area    Assessment/Plan: 1. Functional deficits which require 3+ hours per day of interdisciplinary therapy in a comprehensive inpatient rehab setting. Physiatrist is providing close team supervision and 24 hour management of active medical problems listed below. Physiatrist and rehab team continue to assess barriers to discharge/monitor patient progress toward functional and medical goals  Care Tool:  Bathing    Body parts bathed by patient: Right arm, Left arm, Chest, Abdomen, Right upper leg, Left upper leg, Left lower leg, Right lower leg, Face   Body parts bathed by helper: Front perineal area, Buttocks, Left lower leg, Right lower leg     Bathing assist Assist Level: Moderate Assistance - Patient 50 - 74%     Upper Body Dressing/Undressing Upper body dressing   What is the patient  wearing?: Bra, Pull over shirt    Upper body assist Assist Level: Maximal Assistance - Patient 25 - 49%    Lower Body Dressing/Undressing Lower body dressing      What is the patient wearing?: Pants, Underwear/pull up     Lower body assist Assist for lower body dressing: 2 Helpers     Toileting Toileting    Toileting assist Assist for toileting: Dependent - Patient 0% (use of the Stedy)     Transfers Chair/bed transfer  Transfers assist     Chair/bed transfer assist level: 2 Helpers (squat pivot)     Locomotion Ambulation   Ambulation assist   Ambulation activity did not occur: Safety/medical concerns  Assist level: 2 helpers (+2 modA) Assistive device: Other (comment) (B HHA) Max distance: 24ft   Walk 10 feet activity   Assist  Walk 10 feet activity did not occur: Safety/medical concerns        Walk 50 feet activity   Assist Walk 50 feet with 2 turns activity did not occur: Safety/medical concerns         Walk 150 feet activity   Assist Walk 150 feet activity did not occur: Safety/medical concerns         Walk 10 feet on uneven surface  activity   Assist Walk 10 feet on uneven surfaces activity did not occur: Safety/medical concerns         Wheelchair     Assist Is the patient using a wheelchair?: Yes Type of Wheelchair: Manual    Wheelchair assist level: Dependent - Patient 0%      Wheelchair 50 feet with 2 turns activity    Assist        Assist Level: Dependent - Patient 0%   Wheelchair 150 feet activity     Assist      Assist Level: Dependent - Patient 0%   Blood pressure (!) 148/77, pulse 71, temperature 98 F (36.7 C), temperature source Oral, resp. rate 16, height 5\' 2"  (1.575 m), weight 103.2 kg, SpO2 100 %.    Medical Problem List and Plan: 1. Functional deficits secondary to IPH in the left parasagittal frontal parietal lobes likely secondary to uncontrolled hypertension              -  patient may shower             -ELOS/Goals: MinA 12-16 days              Continue CIR PT, OT 2.  DVT: discussed recommendation not to anti-coagulate given recent IPH and she is in agreement -DVT/anticoagulation:  Mechanical:  Antiembolism stockings, knee (TED hose) Bilateral lower extremities             -antiplatelet therapy: N/A 3. Lower back pain: kpad added. Tylenol as needed 4. Mood: Provide emotional support             -antipsychotic agents: N/A 5. Neuropsych: This patient is capable of making decisions on her own behalf. 6. Skin/Wound Care: Routine skin checks 7. Fluids/Electrolytes/Nutrition: Routine in and outs with follow-up chemistries 8.  Hypertension.  Uncontrolled. Start magnesium gluconate 250mg  HS and check magnesium level tomorrow morning. Mg++ 2.2-nl Vitals:   06/08/21 0344 06/08/21 1249  BP: 126/62 (!) 148/77  Pulse: 68 71  Resp: 18 16  Temp: 97.7 F (36.5 C) 98 F (36.7 C)  SpO2: 93% 100%  IPH with sys BP goal <15mmHg , was on amlodipine 2.5 mg qd PTA  Increase amlodipine to 5mg  on 1/21, if pt still elevated tomorrow would rec increase to 7.5mg  qd  9.  Gram-negative rod UTI.  Completed Keflex- resolved  10.  GERD.  continue Protonix 11.  Hyperlipidemia.  Rosuvastatin 5 mg daily resume at discharge. 12.  Obesity.  BMI 42.05.  Dietary follow-up 13. Constipation: continue magnesium gluconate 250mg  HS.   1/21: Type 5 stool    14.  Hx of lumbar spinal stenosis and spondylosis, has had Lumbar RF in past (2018) , pt reports improvement with muscle relaxers will trial  15.  Chest pain at rest (now resolved) some improvement with sitting position, pt denies hx of reflux but is on protonix , EKG 06/06/21 unchanged vs 05/27/21, in fact T waves normalized  Troponinx 2, neg Had relief with Maalox likely GI  16. Right lower extremity pain: 300mg  Gabapentin ordered HS 17. Right foot drop: Dynamic night splint ordered LOS: 8 days A FACE TO FACE EVALUATION WAS  PERFORMED  Drema Pry Nicklas Mcsweeney 06/08/2021, 5:46 PM

## 2021-06-08 NOTE — Progress Notes (Signed)
Occupational Therapy Session Note  Patient Details  Name: Terri Werner MRN: [id number] Date of Birth: 1950-07-15  Today's Date: 06/08/2021 OT Individual Time: 1000-1106 OT Individual Time Calculation (min): 66 min    Short Term Goals: Week 2:  OT Short Term Goal 1 (Week 2): Patient will complete stand-pivot transfer with MaxAx1 person to the 3:1. OT Short Term Goal 2 (Week 2): Pt will donning underpants and pants with mod assist sit to stand. OT Short Term Goal 3 (Week 2): Pt will complete LB bathing sit to stand with mod assist sit to stand in the shower.  Skilled Therapeutic Interventions/Progress Updates:  Patient met seated in wc in agreement with OT treatment session. 8/10 pain reported at rest and with activity in R calf. Doppler (+) DVT yesterday. Patient cleared for OOB activity by NP Dan A. Session with focus on self-care re-education as detailed below. Patient reporting need for toileting. Total A for wc transport into bathroom. Patient then completed sqat-pivot to [facility] over standard toilet with Max A. Heavy assist at RLE. Patient voided bladder then completed UB bathing/dressing seated on BSC with supervision A and LB bathing/dressing with in stedy with Max A. Total A to don footwear. Did not don R TED 2/2 presence of DVT. In stedy patient transported to sink surface for grooming in perched position with supervision A. Cues for incorporation of RUE throughout session.Session concluded with patient seated in wc with call bell within reach, belt alarm activated and all needs met.   Therapy Documentation Precautions:  Precautions Precautions: Fall, Other (comment) Precaution Comments: R hemiparesis, R LE extensor tone Restrictions Weight Bearing Restrictions: No General:    Therapy/Group: Individual Therapy  Casmir Auguste R Howerton-Davis 06/08/2021, 7:01 AM

## 2021-06-08 NOTE — Progress Notes (Signed)
Physical Therapy Weekly Progress Note  Patient Details  Name: Terri Werner MRN: [id number] Date of Birth: 01/08/1951  Beginning of progress report period: June 01, 2021 End of progress report period: June 08, 2021  Today's Date: 06/08/2021 PT Individual Time: 6440-3474 PT Individual Time Calculation (min): 72 min   Patient has met 4 of 4 short term goals. Ms. Vivenzio is progressing well with therapy demonstrating increasing independence with functional mobility. She is performing bed mobility mod assist, sit<>stands with mod assist, squat pivot transfers mod assist, and has progressed to gait training overground using L hallway rail with mod/max assist of 1 (+2 w/c follow) or up to ~76ft with +2 L HHA with +2 mod assist for balance - pt demos improved R knee control during stance as well as now initiating R swing phase advancement. Her greatest impairments include R hemiparesis with impaired proprioception and poor spatial awareness/midline orientation, which impacts her balance awareness and recruitment of balance recovery strategies. She will benefit from continued CIR level therapies to further progress her independence with functional mobility prior to D/Cing home with 24hr support from her family.   Patient continues to demonstrate the following deficits muscle weakness, muscle joint tightness, and muscle paralysis, decreased cardiorespiratoy endurance, impaired timing and sequencing, abnormal tone, unbalanced muscle activation, decreased coordination, and decreased motor planning, decreased midline orientation and decreased motor planning, and decreased standing balance, decreased postural control, hemiplegia, and decreased balance strategies and therefore will continue to benefit from skilled PT intervention to increase functional independence with mobility.  Patient progressing toward long term goals..  Continue plan of care.  PT Short Term Goals Week 1:  PT  Short Term Goal 1 (Week 1): Pt will perform supine<>sit with mod assist of 1 PT Short Term Goal 1 - Progress (Week 1): Met PT Short Term Goal 2 (Week 1): Pt will perform sit<>stands using LRAD with mod assist of 1 PT Short Term Goal 2 - Progress (Week 1): Met PT Short Term Goal 3 (Week 1): Pt will perform bed<>chair transfers using LRAD with mod assist of 1 PT Short Term Goal 3 - Progress (Week 1): Met PT Short Term Goal 4 (Week 1): Pt will ambulate at least 80ft using LRAD with +2 assist PT Short Term Goal 4 - Progress (Week 1): Met Week 2:  PT Short Term Goal 1 (Week 2): Pt will perform sit<>stands using LRAD with min assist PT Short Term Goal 2 (Week 2): Pt will perform bed<>chair transfers using LRAD with min assist PT Short Term Goal 3 (Week 2): Pt will ambulate at least 34ft using LRAD with max assist of 1 and +2 min assist PT Short Term Goal 4 (Week 2): Pt will participate in Berg Balance Test demonstrating improvement in score by at least 7 points indicating decreased fall risk  Skilled Therapeutic Interventions/Progress Updates:  Ambulation/gait training;Community reintegration;DME/adaptive equipment instruction;Neuromuscular re-education;Psychosocial support;Stair training;UE/LE Strength taining/ROM;Wheelchair propulsion/positioning;Balance/vestibular training;Discharge planning;Functional electrical stimulation;Pain management;Skin care/wound management;Therapeutic Activities;UE/LE Coordination activities;Cognitive remediation/compensation;Disease management/prevention;Functional mobility training;Patient/family education;Splinting/orthotics;Therapeutic Exercise;Visual/perceptual remediation/compensation   Therapist confirmed with Pam, PA that pt is appropriate for therapy given pt's recent R LE DVT findings - PA cleared pt for therapy but stated no heavy resistive exercises. Pt received seated in w/c and agreeable to session but per facial expression pt in more pain than yesterday. Pt's R  LE noted to be observably larger than L LE - therapist educated pt that PA did clear pt to wear TED hose therefore therapist donned for edema management.  Transported  to/from gym in w/c for time management and energy conservation.  Donned R LE Thuasne Sprystep PLS AFO total assist - pt reporting increased pain with ankle DF movement. Gait training ~35ft using L HHA from +2 with 3rd person wheelchair follow - +2 mod assist for balance due to continued R lean/pushing with varying anterior/posterior bias - requires max/total assist to block R knee during stance as pt demos decreased quad activation today due to pain - requires total assist for R swing phase advancement with decreased initiation of movement due to pain - continues to require max verbal/tactile cuing to weight shift L onto stance limb to decrease R lean. Due to increased pain with gait training deferred further trials at this time.   R squat pivot w/c>EOM with light mod assist for lifting/pivoting hips and therapist blocking R knee to promote WBing - cued/educated pt on head/hips relationship and for increased anterior trunk lean.   Repeated sit<>stands x12 to/from EOM to L HHA from +2 assist as needed with mod assist from therapist for balance due to R posterior lean and blocking/guarding R knee - cuing for increased anterior trunk flexion when initiating coming to stand - demos R pelvic shift when going to sit with cuing to improve midline orientation. Once in standing, targeted sustained R quad activation and maintaining midline orientation while preventing R lean - pt has gradual R knee flexion that results in worsening lean and is able to correct with light mod assist and verbal cuing.  L squat pivot EOB>w/c with min assist for pivoting hips and blocking R knee to promote WBing - continued cuing for head/hips relationship and increased anterior trunk flexion.  Transported back to room. Doffed R LE AFO. R squat pivot w/c>recliner as  described above. Pt left seated in recliner with needs in reach and chair alarm on.    Therapy Documentation Precautions:  Precautions Precautions: Fall, Other (comment) Precaution Comments: R hemiparesis, R LE extensor tone Restrictions Weight Bearing Restrictions: No   Pain:  Pt wanting to participate as much as possible in therapy session therefore states her pain is "OK" but therapist able to see pt's facial expression indicating significantly more pain than yesterday - modified session for pt's pain tolerance. Pt also continues to report "sharp" pain in low back that radiates down her R LE - attempted to adjust spine alignment as pt tends to maintain hyperextended trunk posture; however, pt reports trunk flexion is more painful - performed interventions to pt's tolerance and provided rest breaks throughout for pain management.   Therapy/Group: Individual Therapy  Ginny Forth , PT, DPT, NCS, CSRS  06/08/2021, 3:28 PM

## 2021-06-08 NOTE — Progress Notes (Signed)
Results of RLE venous study received indicating presence of DVT.

## 2021-06-09 MED ORDER — GABAPENTIN 300 MG PO CAPS
300.0000 mg | ORAL_CAPSULE | Freq: Three times a day (TID) | ORAL | Status: DC
  Administered 2021-06-09 – 2021-06-27 (×54): 300 mg via ORAL
  Filled 2021-06-09 (×54): qty 1

## 2021-06-09 NOTE — Progress Notes (Signed)
Occupational Therapy Session Note  Patient Details  Name: Terri Werner MRN: [id number] Date of Birth: 02/22/1951  Today's Date: 06/09/2021 OT Individual Time: 7517-0017 OT Individual Time Calculation (min): 63 min    Short Term Goals: Week 1:  OT Short Term Goal 1 (Week 1): Donn UB clothing items with ModA OT Short Term Goal 1 - Progress (Week 1): Met OT Short Term Goal 2 (Week 1): Patient to complete sit to stand with functional task performance with ModA OT Short Term Goal 2 - Progress (Week 1): Met OT Short Term Goal 3 (Week 1): Patient will complete squat-pivot transfer with MaxAx1 person OT Short Term Goal 3 - Progress (Week 1): Met OT Short Term Goal 4 (Week 1): Pt will attend to RUE in relation to placement during functional task performance OT Short Term Goal 4 - Progress (Week 1): Met Week 2:  OT Short Term Goal 1 (Week 2): Patient will complete stand-pivot transfer with MaxAx1 person to the 3:1. OT Short Term Goal 2 (Week 2): Pt will donning underpants and pants with mod assist sit to stand. OT Short Term Goal 3 (Week 2): Pt will complete LB bathing sit to stand with mod assist sit to stand in the shower.  Skilled Therapeutic Interventions/Progress Updates:  Patient met lying supine in bed in agreement with OT treatment session. Mild pain in R calf reported with increased dorsiflexion while positioning for transfers. Supine to EOB with verbal and tactile cues for hemi technique and use of rail with Mod A. Squat-pivot to wc on L with Max A +1 and heavy assist to for attention to R and to maintain position of RLE. Total A for wc transport to bathroom. Patient then completed stand-pivot to [facility] over standard toilet with L hand on grab bar and heavy assist to progress RLE. 3/3 parts of toileting task with Max A for hygiene/clothing management. Patient then completed squat-pivot to wc on right followed by squat-pivot to shower chair on L in same manner as indicated above. Able  to doff footwear with Mod A and doff overhead gown with supervision A. UB bathing with supervision A and LB bathing with Mod A in sitting with lateral leans. Assist to don mesh underwear and pants for time management. Able to pull to standing with use of side rail on bed with assist to hike pants over hips. R lateral lean requiring assist for R knee block. Session concluded with patient seated in wc with call bell within reach, belt alarm activated and all needs met.   Therapy Documentation Precautions:  Precautions Precautions: Fall, Other (comment) Precaution Comments: R hemiparesis, R LE extensor tone Restrictions Weight Bearing Restrictions: No General:   Therapy/Group: Individual Therapy  Joni Colegrove R Howerton-Davis 06/09/2021, 7:01 AM

## 2021-06-09 NOTE — Progress Notes (Signed)
Physical Therapy Session Note  Patient Details  Name: Terri Werner MRN: [id number] Date of Birth: [birth year]  Today's Date: 06/09/2021 PT Individual Time: 8343-7357 PT Individual Time Calculation (min): 53 min   Short Term Goals:  Week 2:  PT Short Term Goal 1 (Week 2): Pt will perform sit<>stands using LRAD with min assist PT Short Term Goal 2 (Week 2): Pt will perform bed<>chair transfers using LRAD with min assist PT Short Term Goal 3 (Week 2): Pt will ambulate at least 33f using LRAD with max assist of 1 and +2 min assist PT Short Term Goal 4 (Week 2): Pt will participate in [place]esa [place]Test demonstrating improvement in score by at least 7 points indicating decreased fall risk   Skilled Therapeutic Interventions/Progress Updates:   Pt received sitting in WC and agreeable to PT. [facility] and moderate cues for sequencing, BLE position and anterior weight shift. Once EOB pt performed sit<>stand x 5 with mod assist and RUE support on PT. Standing balance to perform lateral reach to the L with HHA from PT on the R 2 x 6 with mod assist to improve terminal knee extension on the RLE as well as lateral pelvic and trunk movement to L. Visual feedback of basketball pole at pt midline to improve vertical orientation. Pt performed gait training in hall 2 x 331fwith max A+2 for WC follow, weight shift L and advancement of RLE. Max cues for sequencing, UE placement to reduce pusher syndrome and orientation to midline. Prolonged therapeutic break between bouts. Patient returned to [facility] and [facility] reach and all needs met.         Therapy Documentation Precautions:  Precautions Precautions: Fall, Other (comment) Precaution Comments: R hemiparesis, R LE extensor tone Restrictions Weight Bearing Restrictions: No    Vital Signs: Therapy Vitals Temp: 98.4 F (36.9 C) Temp Source:  Oral Pulse Rate: 71 Resp: 17 BP: 135/63 Patient Position (if appropriate): Sitting Oxygen Therapy SpO2: 100 % O2 Device: Room Air Pain:  Denies at rest    Therapy/Group: Individual Therapy  AuLorie Phenix/27/2023, 5:16 PM

## 2021-06-09 NOTE — Progress Notes (Signed)
Physical Therapy Session Note  Patient Details  Name: Terri Werner MRN: [id number] Date of Birth: 08-16-1950  Today's Date: 06/09/2021 PT Individual Time: 1110-1210 PT Individual Time Calculation (min): 60 min   Short Term Goals: Week 1:  PT Short Term Goal 1 (Week 1): Pt will perform supine<>sit with mod assist of 1 PT Short Term Goal 1 - Progress (Week 1): Met PT Short Term Goal 2 (Week 1): Pt will perform sit<>stands using LRAD with mod assist of 1 PT Short Term Goal 2 - Progress (Week 1): Met PT Short Term Goal 3 (Week 1): Pt will perform bed<>chair transfers using LRAD with mod assist of 1 PT Short Term Goal 3 - Progress (Week 1): Met PT Short Term Goal 4 (Week 1): Pt will ambulate at least 74ft using LRAD with +2 assist PT Short Term Goal 4 - Progress (Week 1): Met Week 2:  PT Short Term Goal 1 (Week 2): Pt will perform sit<>stands using LRAD with min assist PT Short Term Goal 2 (Week 2): Pt will perform bed<>chair transfers using LRAD with min assist PT Short Term Goal 3 (Week 2): Pt will ambulate at least 25ft using LRAD with max assist of 1 and +2 min assist PT Short Term Goal 4 (Week 2): Pt will participate in Berg Balance Test demonstrating improvement in score by at least 7 points indicating decreased fall risk  Skilled Therapeutic Interventions/Progress Updates:  Patient seated in w/c on entrance to room. Patient alert and agreeable to PT session. PT acquired elevating leg rest for pt in order to better support RLE and increased extensor tone with muscle activation.   Patient with no pain complaint throughout session.  Therapeutic Activity: R AFO donned while pt seated in w/c. TotalA for donning of shoes.  Transfers: Patient performed sit<>stand transfers during session with NMR guidance. Rises to stand at [facility] with CGA initially and melts to MinA d/t fatigue in R side. Provided verbal cues for forward lean to rise and RLE knee extension as well as trunk  extension for upright stance and to decrease ant bias.  Toilet transfer at end of session using STEDY and pt able to assist with clothing mgmt at Mod/MaxA. Pericare at supervision.   Neuromuscular Re-ed: NMR facilitated during session with focus on standing balance. Pt guided in sit<>stand training with +2 to assist for balance in standing with no AD. Performs initially to upright stance with CGA with +2 for safety and requires MinA +2 d/t fatigue and melt to R side. Requiring MinA for balance and effort.  NMR performed for improvements in motor control and coordination, balance, sequencing, judgement, and self confidence/ efficacy in performing all aspects of mobility at highest level of independence.   Gait Training:  Patient ambulated 10 ft using LHR in hallway with MaxA and +2 for w/c follow.  Assist required for overcoming anterior bias and advancing RLE. Provided vc/ tc for technique throughout including weight shift, hip extension during stance phase, posterior weight shift, RLE advancement.  Patient seated upright  in w/c at end of session with brakes locked, belt alarm set, and all needs within reach.   Therapy Documentation Precautions:  Precautions Precautions: Fall, Other (comment) Precaution Comments: R hemiparesis, R LE extensor tone Restrictions Weight Bearing Restrictions: No General:   Vital Signs:   Pain: Pain Assessment Pain Scale: 0-10 Pain Score: 6  Pain Type: Chronic pain Pain Location: Back Pain Orientation: Lower Pain Intervention(s): Medication (See eMAR);Pain med given for lower pain score than  stated, per patient request;Repositioned;Emotional support;Relaxation;Rest  Therapy/Group: Individual Therapy  Loel Dubonnet PT, DPT 06/09/2021, 10:13 AM

## 2021-06-09 NOTE — Progress Notes (Signed)
PROGRESS NOTE   Subjective/Complaints: Continuing to have pain from low back into right lower extremity. Gabapentin helped last night. She would like to try this three times per day Working with [facility] in shower.   ROS- denies CP, SOB , N/V/D, +right sided lower back pain radiating into lower extremity.   Objective:   VAS [place] LOWER EXTREMITY VENOUS (DVT)  Result Date: 06/07/2021  Lower Venous DVT Study Patient Name:  Terri Werner  Date of Exam:   06/07/2021 Medical Rec #: [id number]                  Accession #:    [id number] Date of Birth: [birth year]                  Patient Gender: F Patient Age:   71 years Exam Location:  [facility] Procedure:      VAS [place] LOWER EXTREMITY VENOUS (DVT) Referring Phys: Claudette Laws --------------------------------------------------------------------------------  Indications: Right lower extremity pain and swelling.  Comparison Study: No prior studies. Performing Technologist: Jean Rosenthal RDMS, RVT  Examination Guidelines: A complete evaluation includes B-mode imaging, spectral Doppler, color Doppler, and power Doppler as needed of all accessible portions of each vessel. Bilateral testing is considered an integral part of a complete examination. Limited examinations for reoccurring indications may be performed as noted. The reflux portion of the exam is performed with the patient in reverse Trendelenburg.  +---------+---------------+---------+-----------+----------+--------------+  RIGHT     Compressibility Phasicity Spontaneity Properties Thrombus Aging  +---------+---------------+---------+-----------+----------+--------------+  CFV       Full            Yes       Yes                                    +---------+---------------+---------+-----------+----------+--------------+  SFJ       Full                                                              +---------+---------------+---------+-----------+----------+--------------+  FV Prox   Full                                                             +---------+---------------+---------+-----------+----------+--------------+  FV Mid    Full                                                             +---------+---------------+---------+-----------+----------+--------------+  FV Distal Full                                                             +---------+---------------+---------+-----------+----------+--------------+  PFV       Full                                                             +---------+---------------+---------+-----------+----------+--------------+  POP       Full            Yes       Yes                                    +---------+---------------+---------+-----------+----------+--------------+  PTV       None            No        No                     Acute           +---------+---------------+---------+-----------+----------+--------------+  PERO      None            No        No                     Acute           +---------+---------------+---------+-----------+----------+--------------+  Gastroc   Full                                                             +---------+---------------+---------+-----------+----------+--------------+   +----+---------------+---------+-----------+----------+--------------+  LEFT Compressibility Phasicity Spontaneity Properties Thrombus Aging  +----+---------------+---------+-----------+----------+--------------+  CFV  Full            Yes       Yes                                    +----+---------------+---------+-----------+----------+--------------+    Summary: LEFT: - Findings consistent with acute deep vein thrombosis involving the left posterior tibial veins, and left peroneal veins. - No cystic structure found in the popliteal fossa.  *See table(s) above for measurements and observations. Electronically signed by Coral Else MD on  06/07/2021 at 9:15:28 PM.    Final    No results for input(s): WBC, HGB, HCT, PLT in the last 72 hours.  No results for input(s): NA, K, CL, CO2, GLUCOSE, BUN, CREATININE, CALCIUM in the last 72 hours.   Intake/Output Summary (Last 24 hours) at 06/09/2021 1329 Last data filed at 06/09/2021 1254 Gross per 24 hour  Intake 684 ml  Output 1 ml  Net 683 ml        Physical Exam: Vital Signs Blood pressure 119/67, pulse 72, temperature 98.3 F (36.8 C), temperature source Oral, resp. rate 17, height 5\' 2"  (1.575 m), weight 103.2 kg, SpO2 100 %. Gen: no distress, normal appearing HEENT: oral mucosa pink and moist, NCAT Cardio: Reg rate Chest: normal effort, normal rate of breathing Abd: soft, non-distended Ext: no edema Psych: pleasant, normal affect Skin: intact Neuro: Sensory exam normal sensation to light touch and proprioception in bilateral upper and  lower extremities No sensory loss RLE Cerebellar exam normal finger to nose to finger as well as heel to shin in bilateral upper and lower extremities DTR 4+ RIght patellar  Musculoskeletal: Full range of motion in all 4 extremities. No joint swelling No pain to palpation RLE , No pain to palpation over sternal area    Assessment/Plan: 1. Functional deficits which require 3+ hours per day of interdisciplinary therapy in a comprehensive inpatient rehab setting. Physiatrist is providing close team supervision and 24 hour management of active medical problems listed below. Physiatrist and rehab team continue to assess barriers to discharge/monitor patient progress toward functional and medical goals  Care Tool:  Bathing    Body parts bathed by patient: Right arm, Left arm, Chest, Abdomen, Right upper leg, Left upper leg, Left lower leg, Right lower leg, Face, Front perineal area   Body parts bathed by helper: Buttocks, Left lower leg, Right lower leg     Bathing assist Assist Level: Moderate Assistance - Patient 50 - 74%      Upper Body Dressing/Undressing Upper body dressing   What is the patient wearing?: Bra, Pull over shirt    Upper body assist Assist Level: Minimal Assistance - Patient > 75%    Lower Body Dressing/Undressing Lower body dressing      What is the patient wearing?: Pants, Underwear/pull up     Lower body assist Assist for lower body dressing: Maximal Assistance - Patient 25 - 49%     Toileting Toileting    Toileting assist Assist for toileting: Dependent - Patient 0% (use of the Stedy)     Transfers Chair/bed transfer  Transfers assist     Chair/bed transfer assist level: Moderate Assistance - Patient 50 - 74% (squat pivot)     Locomotion Ambulation   Ambulation assist   Ambulation activity did not occur: Safety/medical concerns  Assist level: 2 helpers (+2 modA) Assistive device: Other (comment) (B HHA) Max distance: 59ft   Walk 10 feet activity   Assist  Walk 10 feet activity did not occur: Safety/medical concerns        Walk 50 feet activity   Assist Walk 50 feet with 2 turns activity did not occur: Safety/medical concerns         Walk 150 feet activity   Assist Walk 150 feet activity did not occur: Safety/medical concerns         Walk 10 feet on uneven surface  activity   Assist Walk 10 feet on uneven surfaces activity did not occur: Safety/medical concerns         Wheelchair     Assist Is the patient using a wheelchair?: Yes Type of Wheelchair: Manual    Wheelchair assist level: Dependent - Patient 0%      Wheelchair 50 feet with 2 turns activity    Assist        Assist Level: Dependent - Patient 0%   Wheelchair 150 feet activity     Assist      Assist Level: Dependent - Patient 0%   Blood pressure 119/67, pulse 72, temperature 98.3 F (36.8 C), temperature source Oral, resp. rate 17, height 5\' 2"  (1.575 m), weight 103.2 kg, SpO2 100 %.    Medical Problem List and Plan: 1. Functional deficits  secondary to IPH in the left parasagittal frontal parietal lobes likely secondary to uncontrolled hypertension             -patient may shower             -  ELOS/Goals: MinA 12-16 days Continue CIR PT, OT 2.  DVT: discussed recommendation not to anti-coagulate given recent IPH and she is in agreement -DVT/anticoagulation:  Mechanical:  Antiembolism stockings, knee (TED hose) Bilateral lower extremities             -antiplatelet therapy: N/A 3. Lower back pain: kpad added. Tylenol as needed 4. Mood: Provide emotional support             -antipsychotic agents: N/A 5. Neuropsych: This patient is capable of making decisions on her own behalf. 6. Skin/Wound Care: Routine skin checks 7. Fluids/Electrolytes/Nutrition: Routine in and outs with follow-up chemistries 8.  Hypertension.  Uncontrolled. Start magnesium gluconate 250mg  HS and check magnesium level tomorrow morning. Mg++ 2.2-nl. Continue amlodipine 5mg  daily.  Vitals:   06/08/21 2353 06/09/21 0521  BP: 114/64 119/67  Pulse: 73 72  Resp: 16 17  Temp: (!) 97.4 F (36.3 C) 98.3 F (36.8 C)  SpO2: 98% 100%    9.  Gram-negative rod UTI.  Completed Keflex- resolved  10.  GERD.  continue Protonix 11.  Hyperlipidemia.  Rosuvastatin 5 mg daily resume at discharge. 12.  Obesity.  BMI 42.05.  Dietary follow-up 13. Constipation: continue magnesium gluconate 250mg  HS.  14.  Hx of lumbar spinal stenosis and spondylosis, has had Lumbar RF in past (2018) , pt reports improvement with muscle relaxers will trial  15.  Chest pain at rest (now resolved) some improvement with sitting position, pt denies hx of reflux but is on protonix , EKG 06/06/21 unchanged vs 05/27/21, in fact T waves normalized  Troponinx 2, neg Had relief with Maalox likely GI  16. Right lower extremity pain: 300mg  Gabapentin ordered HS, increase to TID, helping.  17. Right foot drop: continue dynamic night splint LOS: 9 days A FACE TO FACE EVALUATION WAS PERFORMED  Vadim Centola P  Emaly Boschert 06/09/2021, 1:29 PM

## 2021-06-10 DIAGNOSIS — M792 Neuralgia and neuritis, unspecified: Secondary | ICD-10-CM

## 2021-06-10 DIAGNOSIS — K5901 Slow transit constipation: Secondary | ICD-10-CM

## 2021-06-10 DIAGNOSIS — I10 Essential (primary) hypertension: Secondary | ICD-10-CM

## 2021-06-10 NOTE — Progress Notes (Signed)
Occupational Therapy Session Note  Patient Details  Name: Terri Werner MRN: [id number] Date of Birth: September 09, 1950  Today's Date: 06/11/2021 OT Individual Time: 1300-1400 OT Individual Time Calculation (min): 60 min   Skilled Therapeutic Interventions/Progress Updates:    Pt greeted in the w/c with no c/o pain. She really wanted to shower. +2 for squat pivot<TTB with manual facilitation for Rt LE placement, pts legs straddling the wide threshold of shower prior to transferring. She then bathed at seated level using LH sponge to reach feet, CGA-Min A for balance when leaning to complete pericare, pt needing more balance assistance when leaning towards the Rt. Afterwards +2 for squat pivot<w/c. She then dressed at sit<stand level with 2 assist 3 musketeers technique. Pt required assistance for threading LB clothing over her affected Rt side. Min A for bra + shirt. Total A for Teds + gripper socks. Note that in standing pt with pronounced Rt pushing. She remained in the w/c at close of session, all needs within reach and safety belt fastened. Tx focus placed on ADL retraining, functional transfers, sit<stand, and NMR.   Therapy Documentation Precautions:  Precautions Precautions: Fall, Other (comment) Precaution Comments: R hemiparesis, R LE extensor tone Restrictions Weight Bearing Restrictions: No Pain: Pain Assessment Pain Scale: 0-10 Pain Score: 0-No pain Faces Pain Scale: No hurt ADL: ADL Eating: Minimal assistance (simulated task performance) Grooming: Minimal assistance Where Assessed-Grooming: Chair Upper Body Bathing: Minimal assistance Where Assessed-Upper Body Bathing: Shower Lower Body Bathing: Maximal assistance Where Assessed-Lower Body Bathing: Shower Upper Body Dressing: Moderate assistance Where Assessed-Upper Body Dressing: Wheelchair Lower Body Dressing: Maximal assistance Where Assessed-Lower Body Dressing: Wheelchair Toileting: Dependent Where  Assessed-Toileting: Glass blower/designer: Dependent Armed forces technical officer Method: Arts development officer: Energy manager: Dependent Clinical cytogeneticist Method: Librarian, academic: Grab bars, Facilities manager: Dependent Social research officer, government Method: Radiographer, therapeutic: Radio broadcast assistant, Grab bars   Therapy/Group: Individual Therapy  Loyal Holzheimer A Dejuana Weist 06/11/2021, 4:23 PM

## 2021-06-10 NOTE — Progress Notes (Signed)
PROGRESS NOTE   Subjective/Complaints: Still having pain in RLE. Able to sleep but pain still keeping her awake  ROS: Patient denies fever, rash, sore throat, blurred vision, dizziness, nausea, vomiting, diarrhea, cough, shortness of breath or chest pain,  neck pain, headache, or mood change.   Objective:   No results found. No results for input(s): WBC, HGB, HCT, PLT in the last 72 hours.  No results for input(s): NA, K, CL, CO2, GLUCOSE, BUN, CREATININE, CALCIUM in the last 72 hours.   Intake/Output Summary (Last 24 hours) at 06/10/2021 1352 Last data filed at 06/10/2021 1241 Gross per 24 hour  Intake 717 ml  Output --  Net 717 ml        Physical Exam: Vital Signs Blood pressure (!) 152/76, pulse 79, temperature 98.5 F (36.9 C), resp. rate 16, height 5\' 2"  (1.575 m), weight 103.2 kg, SpO2 100 %. Constitutional: No distress . Vital signs reviewed. HEENT: NCAT, EOMI, oral membranes moist Neck: supple Cardiovascular: RRR without murmur. No JVD    Respiratory/Chest: CTA Bilaterally without wheezes or rales. Normal effort    GI/Abdomen: BS +, non-tender, non-distended Ext: no clubbing, cyanosis, or edema Psych: pleasant and cooperative  Skin: intact Neuro: Sensory exam normal sensation to light touch and proprioception in bilateral upper and lower extremities Normal sensory exam RLE Cerebellar exam normal finger to nose to finger as well as heel to shin in bilateral upper and lower extremities DTR 4+ RIght patellar  Musculoskeletal: Full range of motion in all 4 extremities. No joint swelling     Assessment/Plan: 1. Functional deficits which require 3+ hours per day of interdisciplinary therapy in a comprehensive inpatient rehab setting. Physiatrist is providing close team supervision and 24 hour management of active medical problems listed below. Physiatrist and rehab team continue to assess barriers to  discharge/monitor patient progress toward functional and medical goals  Care Tool:  Bathing    Body parts bathed by patient: Right arm, Left arm, Chest, Abdomen, Right upper leg, Left upper leg, Left lower leg, Right lower leg, Face, Front perineal area   Body parts bathed by helper: Buttocks, Left lower leg, Right lower leg     Bathing assist Assist Level: Moderate Assistance - Patient 50 - 74%     Upper Body Dressing/Undressing Upper body dressing   What is the patient wearing?: Bra, Pull over shirt    Upper body assist Assist Level: Minimal Assistance - Patient > 75%    Lower Body Dressing/Undressing Lower body dressing      What is the patient wearing?: Pants, Underwear/pull up     Lower body assist Assist for lower body dressing: Maximal Assistance - Patient 25 - 49%     Toileting Toileting    Toileting assist Assist for toileting: Dependent - Patient 0% (use of the Stedy)     Transfers Chair/bed transfer  Transfers assist     Chair/bed transfer assist level: Moderate Assistance - Patient 50 - 74% (squat pivot)     Locomotion Ambulation   Ambulation assist   Ambulation activity did not occur: Safety/medical concerns  Assist level: 2 helpers (+2 modA) Assistive device: Other (comment) (B HHA) Max distance: 32ft  Walk 10 feet activity   Assist  Walk 10 feet activity did not occur: Safety/medical concerns        Walk 50 feet activity   Assist Walk 50 feet with 2 turns activity did not occur: Safety/medical concerns         Walk 150 feet activity   Assist Walk 150 feet activity did not occur: Safety/medical concerns         Walk 10 feet on uneven surface  activity   Assist Walk 10 feet on uneven surfaces activity did not occur: Safety/medical concerns         Wheelchair     Assist Is the patient using a wheelchair?: Yes Type of Wheelchair: Manual    Wheelchair assist level: Dependent - Patient 0%       Wheelchair 50 feet with 2 turns activity    Assist        Assist Level: Dependent - Patient 0%   Wheelchair 150 feet activity     Assist      Assist Level: Dependent - Patient 0%   Blood pressure (!) 152/76, pulse 79, temperature 98.5 F (36.9 C), resp. rate 16, height 5\' 2"  (1.575 m), weight 103.2 kg, SpO2 100 %.    Medical Problem List and Plan: 1. Functional deficits secondary to IPH in the left parasagittal frontal parietal lobes likely secondary to uncontrolled hypertension             -patient may shower             -ELOS/Goals: MinA 12-16 days -Continue CIR therapies including PT, OT  2.  DVT: discussed recommendation not to anti-coagulate given recent IPH and she is in agreement -DVT/anticoagulation:  Mechanical:  Antiembolism stockings, knee (TED hose) Bilateral lower extremities             -antiplatelet therapy: N/A 3. Lower back pain: kpad added. Tylenol as needed 4. Mood: Provide emotional support             -antipsychotic agents: N/A 5. Neuropsych: This patient is capable of making decisions on her own behalf. 6. Skin/Wound Care: Routine skin checks 7. Fluids/Electrolytes/Nutrition: Routine in and outs with follow-up chemistries 8.  Hypertension.  Uncontrolled. Start magnesium gluconate 250mg  HS and check magnesium level tomorrow morning. Mg++ 2.2-nl. Continue amlodipine 5mg  daily.  Vitals:   06/10/21 0445 06/10/21 1328  BP: (!) 93/53 (!) 152/76  Pulse: 68 79  Resp: 16 16  Temp: 98.4 F (36.9 C) 98.5 F (36.9 C)  SpO2: 97% 100%    9.  Gram-negative rod UTI.  Completed Keflex- resolved  10.  GERD.  continue Protonix 11.  Hyperlipidemia.  Rosuvastatin 5 mg daily resume at discharge. 12.  Obesity.  BMI 42.05.  Dietary follow-up 13. Constipation: continue magnesium gluconate 250mg  HS.  14.  Hx of lumbar spinal stenosis and spondylosis, has had Lumbar RF in past (2018) , pt reports improvement with muscle relaxers will trial  15.  Chest pain  at rest (now resolved) some improvement with sitting position, pt denies hx of reflux but is on protonix , EKG 06/06/21 unchanged vs 05/27/21, in fact T waves normalized  Troponinx 2, neg Had relief with Maalox likely GI  16. Right lower extremity pain:   Gabapentin was just increased to TID on 1/27--observe today before making further changes.  17. Right foot drop: continue dynamic night splint   LOS: 10 days A FACE TO FACE EVALUATION WAS PERFORMED  Meredith Staggers 06/10/2021, 1:52 PM

## 2021-06-11 NOTE — Progress Notes (Signed)
PROGRESS NOTE   Subjective/Complaints: Had a pretty good  night. Right leg feels tingly but pain seems to be improving  ROS: Patient denies fever, rash, sore throat, blurred vision, dizziness, nausea, vomiting, diarrhea, cough, shortness of breath or chest pain,    neck pain, headache, or mood change.   Objective:   No results found. No results for input(s): WBC, HGB, HCT, PLT in the last 72 hours.  No results for input(s): NA, K, CL, CO2, GLUCOSE, BUN, CREATININE, CALCIUM in the last 72 hours.   Intake/Output Summary (Last 24 hours) at 06/11/2021 0806 Last data filed at 06/10/2021 1832 Gross per 24 hour  Intake 474 ml  Output --  Net 474 ml        Physical Exam: Vital Signs Blood pressure 118/68, pulse 64, temperature 98.2 F (36.8 C), temperature source Oral, resp. rate 14, height 5\' 2"  (1.575 m), weight 103.2 kg, SpO2 94 %. Constitutional: No distress . Vital signs reviewed. HEENT: NCAT, EOMI, oral membranes moist Neck: supple Cardiovascular: RRR without murmur. No JVD    Respiratory/Chest: CTA Bilaterally without wheezes or rales. Normal effort    GI/Abdomen: BS +, non-tender, non-distended Ext: no clubbing, cyanosis, or edema Psych: pleasant and cooperative  Skin: intact Neuro: Sensory exam intact for pain and gross touch RLE. Cerebellar exam normal finger to nose to finger as well as heel to shin in bilateral upper and lower extremities. Right ADF weakness Musculoskeletal: Full range of motion in all 4 extremities. No joint swelling     Assessment/Plan: 1. Functional deficits which require 3+ hours per day of interdisciplinary therapy in a comprehensive inpatient rehab setting. Physiatrist is providing close team supervision and 24 hour management of active medical problems listed below. Physiatrist and rehab team continue to assess barriers to discharge/monitor patient progress toward functional and medical  goals  Care Tool:  Bathing    Body parts bathed by patient: Right arm, Left arm, Chest, Abdomen, Right upper leg, Left upper leg, Left lower leg, Right lower leg, Face, Front perineal area   Body parts bathed by helper: Buttocks, Left lower leg, Right lower leg     Bathing assist Assist Level: Moderate Assistance - Patient 50 - 74%     Upper Body Dressing/Undressing Upper body dressing   What is the patient wearing?: Bra, Pull over shirt    Upper body assist Assist Level: Minimal Assistance - Patient > 75%    Lower Body Dressing/Undressing Lower body dressing      What is the patient wearing?: Pants, Underwear/pull up     Lower body assist Assist for lower body dressing: Maximal Assistance - Patient 25 - 49%     Toileting Toileting    Toileting assist Assist for toileting: Dependent - Patient 0% (use of the Stedy)     Transfers Chair/bed transfer  Transfers assist     Chair/bed transfer assist level: Moderate Assistance - Patient 50 - 74% (squat pivot)     Locomotion Ambulation   Ambulation assist   Ambulation activity did not occur: Safety/medical concerns  Assist level: 2 helpers (+2 modA) Assistive device: Other (comment) (B HHA) Max distance: 96ft   Walk 10 feet activity  Assist  Walk 10 feet activity did not occur: Safety/medical concerns        Walk 50 feet activity   Assist Walk 50 feet with 2 turns activity did not occur: Safety/medical concerns         Walk 150 feet activity   Assist Walk 150 feet activity did not occur: Safety/medical concerns         Walk 10 feet on uneven surface  activity   Assist Walk 10 feet on uneven surfaces activity did not occur: Safety/medical concerns         Wheelchair     Assist Is the patient using a wheelchair?: Yes Type of Wheelchair: Manual    Wheelchair assist level: Dependent - Patient 0%      Wheelchair 50 feet with 2 turns activity    Assist         Assist Level: Dependent - Patient 0%   Wheelchair 150 feet activity     Assist      Assist Level: Dependent - Patient 0%   Blood pressure 118/68, pulse 64, temperature 98.2 F (36.8 C), temperature source Oral, resp. rate 14, height 5\' 2"  (1.575 m), weight 103.2 kg, SpO2 94 %.    Medical Problem List and Plan: 1. 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  DVT: discussed recommendation not to anti-coagulate given recent IPH and she is in agreement -DVT/anticoagulation:  Mechanical:  Antiembolism stockings, knee (TED hose) Bilateral lower extremities             -antiplatelet therapy: N/A 3. Lower back pain: kpad added. Tylenol as needed 4. Mood: Provide emotional support             -antipsychotic agents: N/A 5. Neuropsych: This patient is capable of making decisions on her own behalf. 6. Skin/Wound Care: Routine skin checks 7. Fluids/Electrolytes/Nutrition: Routine in and outs with follow-up chemistries 8.  Hypertension.  Uncontrolled. Start magnesium gluconate 250mg  HS and check magnesium level tomorrow morning. Mg++ 2.2-nl. Continue amlodipine 5mg  daily.  Vitals:   06/10/21 2008 06/11/21 0450  BP: 137/84 118/68  Pulse: 74 64  Resp: 14 14  Temp: 98.3 F (36.8 C) 98.2 F (36.8 C)  SpO2: 99% 94%    9.  Gram-negative rod UTI.  Completed Keflex- resolved  10.  GERD.  continue Protonix 11.  Hyperlipidemia.  Rosuvastatin 5 mg daily resume at discharge. 12.  Obesity.  BMI 42.05.  Dietary follow-up 13. Constipation: continue magnesium gluconate 250mg  HS.  14.  Hx of lumbar spinal stenosis and spondylosis, has had Lumbar RF in past (2018) , pt reports improvement with muscle relaxers will trial  15.  Chest pain at rest (now resolved) some improvement with sitting  position, pt denies hx of reflux but is on protonix , EKG 06/06/21 unchanged vs 05/27/21, in fact T waves normalized  Troponinx 2, neg Had relief with Maalox likely GI  16. Right lower extremity pain:   Gabapentin was just increased to TID on 1/27--pain seems better 1/29. Continue with current plan.  17. Right foot drop: continue dynamic night splint   LOS: 11 days A FACE TO FACE EVALUATION WAS PERFORMED  Meredith Staggers 06/11/2021, 8:06 AM

## 2021-06-12 NOTE — Progress Notes (Signed)
Physical Therapy Session Note  Patient Details  Name: Terri Werner MRN: [id number] Date of Birth: 02-Jul-1950  Today's Date: 06/12/2021 PT Individual Time: 2703-5009 PT Individual Time Calculation (min): 24 min   Short Term Goals: Week 1:  PT Short Term Goal 1 (Week 1): Pt will perform supine<>sit with mod assist of 1 PT Short Term Goal 1 - Progress (Week 1): Met PT Short Term Goal 2 (Week 1): Pt will perform sit<>stands using LRAD with mod assist of 1 PT Short Term Goal 2 - Progress (Week 1): Met PT Short Term Goal 3 (Week 1): Pt will perform bed<>chair transfers using LRAD with mod assist of 1 PT Short Term Goal 3 - Progress (Week 1): Met PT Short Term Goal 4 (Week 1): Pt will ambulate at least 32f using LRAD with +2 assist PT Short Term Goal 4 - Progress (Week 1): Met Week 2:  PT Short Term Goal 1 (Week 2): Pt will perform sit<>stands using LRAD with min assist PT Short Term Goal 2 (Week 2): Pt will perform bed<>chair transfers using LRAD with min assist PT Short Term Goal 3 (Week 2): Pt will ambulate at least 563fusing LRAD with max assist of 1 and +2 min assist PT Short Term Goal 4 (Week 2): Pt will participate in Be[place]est demonstrating improvement in score by at least 7 points indicating decreased fall risk  Skilled Therapeutic Interventions/Progress Updates:   Received pt sitting in WC with NT taking vitals. Pt agreeable to PT treatment and denied any pain during session. Session with emphasis on functional mobility/transfers, NMR, and standing balance. [facility]. Worked on blocked practice sit<>stands 2x5 reps with mod A of 1 - cues to scoot to edge of WC and for anterior weight shifting upon standing. Pt placed RUE around therapist's shoulder and required total A for RLE foot postioning. While standing, worked on lateral weight shifting and midline orientation using straight back chair with cues to  "have hips mirror chair" - guarding R knee from buckling. Emphasized eccentric quad control when transitioning stand<>sit. Then worked on RLE knee extensions AAROM on towel 1x5 and 1x3 reps to fatigue - traces of knee extension noted. Concluded session with pt sitting in WC, needs within reach, and seatbelt alarm on.   Therapy Documentation Precautions:  Precautions Precautions: Fall, Other (comment) Precaution Comments: R hemiparesis, R LE extensor tone Restrictions Weight Bearing Restrictions: No  Therapy/Group: Individual Therapy AnAlfonse AlpersT, DPT   06/12/2021, 7:37 AM

## 2021-06-12 NOTE — Progress Notes (Signed)
Physical Therapy Session Note  Patient Details  Name: Terri Werner MRN: [id number] Date of Birth: 03-25-1951  Today's Date: 06/12/2021 PT Individual Time: 0900-0956 PT Individual Time Calculation (min): 56 min   Short Term Goals: Week 1:  PT Short Term Goal 1 (Week 1): Pt will perform supine<>sit with mod assist of 1 PT Short Term Goal 1 - Progress (Week 1): Met PT Short Term Goal 2 (Week 1): Pt will perform sit<>stands using LRAD with mod assist of 1 PT Short Term Goal 2 - Progress (Week 1): Met PT Short Term Goal 3 (Week 1): Pt will perform bed<>chair transfers using LRAD with mod assist of 1 PT Short Term Goal 3 - Progress (Week 1): Met PT Short Term Goal 4 (Week 1): Pt will ambulate at least 39f using LRAD with +2 assist PT Short Term Goal 4 - Progress (Week 1): Met Week 2:  PT Short Term Goal 1 (Week 2): Pt will perform sit<>stands using LRAD with min assist PT Short Term Goal 2 (Week 2): Pt will perform bed<>chair transfers using LRAD with min assist PT Short Term Goal 3 (Week 2): Pt will ambulate at least 557fusing LRAD with max assist of 1 and +2 min assist PT Short Term Goal 4 (Week 2): Pt will participate in Be[place]hol[place]est demonstrating improvement in score by at least 7 points indicating decreased fall risk Week 3:     Skilled Therapeutic Interventions/Progress Updates:    PAIN denies pain Pt initially oob in wc and agreeable to session w/focus on gait training. Pt transported to hallway.  Pt already wearing R AFO.  Gait trial 1: 2037f 2 person assist x 1 W/HHA on L and max assist to R for wt shifting, attention to R, advancement of R limb, max cues to facilitate R quad activation w/loading of limb, max cues for upright posture, heavy R lean  [id number] using handrail on L, mod assist of 1 for advancement of RLE, cues to activate R quads w/loading, cues to achieve trailing limb on R to facilitate initiation of swing which she is able to complete several reps  but assumes ER of limb, fatigues quickly, cues to attend to R, improved stability and efficiency vs HHA above but both beneficial due to handrail not fnxl.  Attempted retropulsion of wc for bipedal activity but this results in extensor pattern RLE w/difficulty flexing therefore discontinued activity.  Pt left oob in wc w/alarm belt set and needs in reach   Therapy Documentation Precautions:  Precautions Precautions: Fall, Other (comment) Precaution Comments: R hemiparesis, R LE extensor tone Restrictions Weight Bearing Restrictions: No   Therapy/Group: Individual Therapy BarbCallie Fielding  [place]0/2023, 4:08 PM

## 2021-06-12 NOTE — Progress Notes (Signed)
Occupational Therapy Session Note  Patient Details  Name: Terri Werner MRN: [id number] Date of Birth: [birth year]  Today's Date: 06/12/2021 OT Individual Time: 0800-0859 OT Individual Time Calculation (min): 59 min  Session 2: 1445- 1530 ( 45 mins)  Short Term Goals: Week 2:  OT Short Term Goal 1 (Week 2): Patient will complete stand-pivot transfer with MaxAx1 person to the 3:1. OT Short Term Goal 2 (Week 2): Pt will donning underpants and pants with mod assist sit to stand. OT Short Term Goal 3 (Week 2): Pt will complete LB bathing sit to stand with mod assist sit to stand in the shower.  Skilled Therapeutic Interventions/Progress Updates:  Session 1: Pt greeted supine in bed  agreeable to OT intervention. Session focus on BADL reeducation, functional mobility, dynamic standing balance and decreasing overall caregiver burden. Pt completed supine>sit with MOD A needing assist to maneuver LLE to EOB. Pt request toilet transfer to Terri Werner, pt completed stand pivot to Terri Werner with Rw and  MODA +2 to pivot. Pt completing 3/3 toileting tasks with MAX A +2 with pt able to complete anterior pericare in standing but needed assist for clothing mgmt and for sit<>stand. Pt completed bathing from Terri Werner with pt needing MIN A for bathing from sitting. MIN A to don [place] shirt with most assist needed to don bra. MODA  +2 for LB dressing to don pants and underwear as pt needed assist for stand and to pull pants up to waist line in standing. Total A to don shoes and AFO. Pt completed additional stand pivot from Terri Werner to w/c with MOD A +2. Pt completed seated oral care from w/c with set- up assist.  pt left seated in w/c with all needs within reach.        Session 2: Pt greeted seated in w/c  agreeable to OT intervention. Pt wanting to work on [place] as pt reports "I feel like it jsut hangs there and needs to be stretched." Session focus on functional mobility, RLE therex/ gentle stretching to promote improved  strength and support in RLE for ADL participation as well as working to break up tone and mild clonus noted in R ankle. Pt completed stand pivot transfer from w/c>EOB to pts L side with Rw and MOD A +2 needing assist to maneuver R foot during transfer, MOD A for sit>supine needing assist to elevate BLEs. Remainder of session to focus on PROM/AAROM to RLE as indicated below:    PROM Ankle dorsiflexion/plantar flexion 2x10 reps ( set pt up with gait belt around R foot to complete AAROM on her own) PROM Knee flexion/extension 2x10 reps PROM Hip flexion/extension 2 x10 reps PROM Short arc quad sets with towel underneath R knee x10 reps AAROM hip ABD/ADD from supine x10 reps  Placed sign over pts bed to assist with carryover of remembering to don night time RLE AFO  pt left supine in bed with all needs within reach.                                 Therapy Documentation Precautions:  Precautions Precautions: Fall, Other (comment) Precaution Comments: R hemiparesis, R LE extensor tone Restrictions Weight Bearing Restrictions: No  Pain: no pain reported during either session     Therapy/Group: Individual Therapy  Corinne Ports Terri Werner 06/12/2021, 10:14 AM

## 2021-06-12 NOTE — Progress Notes (Signed)
PROGRESS NOTE   Subjective/Complaints:  Dressing with OT, no pain c/os  ROS: Patient denies CP, SOB, N/V/D  Objective:   No results found. No results for input(s): WBC, HGB, HCT, PLT in the last 72 hours.  No results for input(s): NA, K, CL, CO2, GLUCOSE, BUN, CREATININE, CALCIUM in the last 72 hours.   Intake/Output Summary (Last 24 hours) at 06/12/2021 0834 Last data filed at 06/11/2021 1806 Gross per 24 hour  Intake 240 ml  Output --  Net 240 ml         Physical Exam: Vital Signs Blood pressure (!) 102/58, pulse 77, temperature 98.6 F (37 C), temperature source Oral, resp. rate 14, height 5\' 2"  (1.575 m), weight 103.2 kg, SpO2 96 %.  General: No acute distress Mood and affect are appropriate Heart: Regular rate and rhythm no rubs murmurs or extra sounds Lungs: Clear to auscultation, breathing unlabored, no rales or wheezes Abdomen: Positive bowel sounds, soft nontender to palpation, nondistended Extremities: No clubbing, cyanosis, or edema Skin: No evidence of breakdown, no evidence of rash Neurologic: Cranial nerves II through XII intact, motor strength is 5/5 in bilateral deltoid, bicep, tricep, grip, 3/5 Right knee ext 0/5 RIght foot and ankle  and 5/5/ Left hip flexor, knee extensors, ankle dorsiflexor and plantar flexor Sensory exam normal sensation to light touch and proprioception in bilateral upper and lower extremities Cerebellar exam normal finger to nose to finger as well as heel to shin in bilateral upper and lower extremities Musculoskeletal: Full range of motion in all 4 extremities. No joint swelling   Psych: pleasant and cooperative  Skin: intact Neuro: Sensory exam intact for pain and gross touch RLE. Cerebellar exam normal finger to nose to finger as well as heel to shin in bilateral upper and lower extremities. Right ADF weakness Musculoskeletal: Full range of motion in all 4 extremities.  No joint swelling     Assessment/Plan: 1. Functional deficits which require 3+ hours per day of interdisciplinary therapy in a comprehensive inpatient rehab setting. Physiatrist is providing close team supervision and 24 hour management of active medical problems listed below. Physiatrist and rehab team continue to assess barriers to discharge/monitor patient progress toward functional and medical goals  Care Tool:  Bathing    Body parts bathed by patient: Right arm, Left arm, Chest, Abdomen, Right upper leg, Left upper leg, Left lower leg, Right lower leg, Face, Front perineal area, Buttocks   Body parts bathed by helper: Buttocks, Left lower leg, Right lower leg     Bathing assist Assist Level: Minimal Assistance - Patient > 75%     Upper Body Dressing/Undressing Upper body dressing   What is the patient wearing?: Bra, Pull over shirt    Upper body assist Assist Level: Minimal Assistance - Patient > 75%    Lower Body Dressing/Undressing Lower body dressing      What is the patient wearing?: Pants, Underwear/pull up     Lower body assist Assist for lower body dressing: 2 Helpers (sit<stand)     Toileting Toileting    Toileting assist Assist for toileting: Dependent - Patient 0% (use of the Stedy)     Transfers Chair/bed transfer  Transfers assist     Chair/bed transfer assist level: Moderate Assistance - Patient 50 - 74% (squat pivot)     Locomotion Ambulation   Ambulation assist   Ambulation activity did not occur: Safety/medical concerns  Assist level: 2 helpers (+2 modA) Assistive device: Other (comment) (B HHA) Max distance: 45ft   Walk 10 feet activity   Assist  Walk 10 feet activity did not occur: Safety/medical concerns        Walk 50 feet activity   Assist Walk 50 feet with 2 turns activity did not occur: Safety/medical concerns         Walk 150 feet activity   Assist Walk 150 feet activity did not occur: Safety/medical  concerns         Walk 10 feet on uneven surface  activity   Assist Walk 10 feet on uneven surfaces activity did not occur: Safety/medical concerns         Wheelchair     Assist Is the patient using a wheelchair?: Yes Type of Wheelchair: Manual    Wheelchair assist level: Dependent - Patient 0%      Wheelchair 50 feet with 2 turns activity    Assist        Assist Level: Dependent - Patient 0%   Wheelchair 150 feet activity     Assist      Assist Level: Dependent - Patient 0%   Blood pressure (!) 102/58, pulse 77, temperature 98.6 F (37 C), temperature source Oral, resp. rate 14, height 5\' 2"  (1.575 m), weight 103.2 kg, SpO2 96 %.    Medical Problem List and Plan: 1. 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  DVT: discussed recommendation not to anti-coagulate given recent IPH and she is in agreement -DVT/anticoagulation:  Mechanical:  Antiembolism stockings, knee (TED hose) Bilateral lower extremities             -antiplatelet therapy: N/A 3. Lower back pain: kpad added. Tylenol as needed 4. Mood: Provide emotional support             -antipsychotic agents: N/A 5. Neuropsych: This patient is capable of making decisions on her own behalf. 6. Skin/Wound Care: Routine skin checks 7. Fluids/Electrolytes/Nutrition: Routine in and outs with follow-up chemistries 8.  Hypertension.  Start magnesium gluconate 250mg  HS and check magnesium level tomorrow morning. Mg++ 2.2-nl. Continue amlodipine 5mg  daily.  Vitals:   06/11/21 1948 06/12/21 0425  BP: 103/89 (!) 102/58  Pulse: 84 77  Resp: 14 14  Temp: 98 F (36.7 C) 98.6 F (37 C)  SpO2: 99% 96%  Controlled no dizziness   9.  Gram-negative rod UTI.  Completed Keflex- resolved  10.  GERD.   continue Protonix 11.  Hyperlipidemia.  Rosuvastatin 5 mg daily resume at discharge. 12.  Obesity.  BMI 42.05.  Dietary follow-up 13. Constipation: continue magnesium gluconate 250mg  HS.  14.  Hx of lumbar spinal stenosis and spondylosis, has had Lumbar RF in past (2018) , pt reports improvement with muscle relaxers will trial  15.  Chest pain at rest (now resolved) some improvement with sitting position, pt denies hx of reflux but is on protonix , EKG 06/06/21 unchanged vs 05/27/21, in fact T  waves normalized  Troponinx 2, neg Had relief with Maalox likely GI  16. Right lower extremity pain:   Gabapentin was just increased to TID on 1/27--pain seems better 1/29. Continue with current plan.  17. Right foot drop: continue dynamic night splint qhs, using trial AFO , will need AFO prior to D/C    LOS: 12 days A FACE TO FACE EVALUATION WAS PERFORMED  Charlett Blake 06/12/2021, 8:34 AM

## 2021-06-13 NOTE — Progress Notes (Signed)
PROGRESS NOTE   Subjective/Complaints:  Still needing Max A for shower xfer per OT , no pain c/os , discussed bracing for RLE  ROS: Patient denies CP, SOB, N/V/D  Objective:   No results found. No results for input(s): WBC, HGB, HCT, PLT in the last 72 hours.  No results for input(s): NA, K, CL, CO2, GLUCOSE, BUN, CREATININE, CALCIUM in the last 72 hours.   Intake/Output Summary (Last 24 hours) at 06/13/2021 0808 Last data filed at 06/13/2021 0807 Gross per 24 hour  Intake 950 ml  Output --  Net 950 ml         Physical Exam: Vital Signs Blood pressure 115/67, pulse 61, temperature (!) 97.4 F (36.3 C), resp. rate 16, height 5\' 2"  (1.575 m), weight 103.2 kg, SpO2 100 %.  General: No acute distress Mood and affect are appropriate Heart: Regular rate and rhythm no rubs murmurs or extra sounds Lungs: Clear to auscultation, breathing unlabored, no rales or wheezes Abdomen: Positive bowel sounds, soft nontender to palpation, nondistended Extremities: No clubbing, cyanosis, or edema Skin: No evidence of breakdown, no evidence of rash Neurologic: Cranial nerves II through XII intact, motor strength is 5/5 in bilateral deltoid, bicep, tricep, grip, 3/5 Right knee ext 0/5 RIght foot and ankle  and 5/5/ Left hip flexor, knee extensors, ankle dorsiflexor and plantar flexor Sensory exam normal sensation to light touch and proprioception in bilateral upper and lower extremities Cerebellar exam normal finger to nose to finger as well as heel to shin in bilateral upper and lower extremities Musculoskeletal: Full range of motion in all 4 extremities. No joint swelling   Psych: pleasant and cooperative  Skin: intact Neuro: Sensory exam intact for pain and gross touch RLE. Cerebellar exam normal finger to nose to finger   Musculoskeletal: Full range of motion in all 4 extremities. No joint swelling     Assessment/Plan: 1.  Functional deficits which require 3+ hours per day of interdisciplinary therapy in a comprehensive inpatient rehab setting. Physiatrist is providing close team supervision and 24 hour management of active medical problems listed below. Physiatrist and rehab team continue to assess barriers to discharge/monitor patient progress toward functional and medical goals  Care Tool:  Bathing    Body parts bathed by patient: Right arm, Left arm, Chest, Abdomen, Front perineal area, Buttocks, Right upper leg, Left upper leg, Face   Body parts bathed by helper: Buttocks, Left lower leg, Right lower leg     Bathing assist Assist Level: Minimal Assistance - Patient > 75%     Upper Body Dressing/Undressing Upper body dressing   What is the patient wearing?: Bra, Pull over shirt    Upper body assist Assist Level: Minimal Assistance - Patient > 75%    Lower Body Dressing/Undressing Lower body dressing      What is the patient wearing?: Pants, Underwear/pull up     Lower body assist Assist for lower body dressing: 2 Helpers (MOD A +2)     Toileting Toileting    Toileting assist Assist for toileting: 2 Helpers [facility])     Transfers Chair/bed transfer  Transfers assist     Chair/bed transfer assist level: Moderate Assistance -  Patient 50 - 74% (squat pivot)     Locomotion Ambulation   Ambulation assist   Ambulation activity did not occur: Safety/medical concerns  Assist level: 2 helpers (+2 modA) Assistive device: Other (comment) (B HHA) Max distance: 57ft   Walk 10 feet activity   Assist  Walk 10 feet activity did not occur: Safety/medical concerns        Walk 50 feet activity   Assist Walk 50 feet with 2 turns activity did not occur: Safety/medical concerns         Walk 150 feet activity   Assist Walk 150 feet activity did not occur: Safety/medical concerns         Walk 10 feet on uneven surface  activity   Assist Walk 10 feet on uneven surfaces  activity did not occur: Safety/medical concerns         Wheelchair     Assist Is the patient using a wheelchair?: Yes Type of Wheelchair: Manual    Wheelchair assist level: Dependent - Patient 0%      Wheelchair 50 feet with 2 turns activity    Assist        Assist Level: Dependent - Patient 0%   Wheelchair 150 feet activity     Assist      Assist Level: Dependent - Patient 0%   Blood pressure 115/67, pulse 61, temperature (!) 97.4 F (36.3 C), resp. rate 16, height 5\' 2"  (1.575 m), weight 103.2 kg, SpO2 100 %.    Medical Problem List and Plan: 1. 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  DVT: discussed recommendation not to anti-coagulate given recent IPH and she is in agreement -DVT/anticoagulation:  Mechanical:  Antiembolism stockings, knee (TED hose) Bilateral lower extremities             -antiplatelet therapy: N/A 3. Lower back pain: kpad added. Tylenol as needed 4. Mood: Provide emotional support             -antipsychotic agents: N/A 5. Neuropsych: This patient is capable of making decisions on her own behalf. 6. Skin/Wound Care: Routine skin checks 7. Fluids/Electrolytes/Nutrition: Routine in and outs with follow-up chemistries 8.  Hypertension.  Start magnesium gluconate 250mg  HS and check magnesium level tomorrow morning.Continue amlodipine 5mg  daily.  Vitals:   06/12/21 2024 06/13/21 0452  BP: (!) 143/84 115/67  Pulse: 75 61  Resp: 16 16  Temp: 97.7 F (36.5 C) (!) 97.4 F (36.3 C)  SpO2: 99% 100%  Controlled no dizziness 1/31  9.  Gram-negative rod UTI.  Completed Keflex- resolved  10.  GERD.  continue Protonix 11.  Hyperlipidemia.  Rosuvastatin 5 mg daily resume at discharge. 12.  Obesity.  BMI 42.05.   Dietary follow-up 13. Constipation: continue magnesium gluconate 250mg  HS.  14.  Hx of lumbar spinal stenosis and spondylosis, has had Lumbar RF in past (2018) , pt reports improvement with muscle relaxers will trial  15.  Chest pain at rest (now resolved) some improvement with sitting position, pt denies hx of reflux but is on protonix , EKG 06/06/21 unchanged vs 05/27/21, in fact T waves normalized  Troponinx 2, neg Had relief with Maalox likely GI  16.  Right lower extremity pain:   Gabapentin was just increased to TID on 1/27--pain seems better 1/31. Continue with current plan.  17. Right foot drop: continue dynamic night splint qhs, using trial AFO , will need AFO prior to D/C    LOS: 13 days A FACE TO FACE EVALUATION WAS PERFORMED  Charlett Blake 06/13/2021, 8:08 AM

## 2021-06-13 NOTE — Progress Notes (Signed)
Physical Therapy Session Note  Patient Details  Name: Terri Werner MRN: [id number] Date of Birth: April 02, 1951  Today's Date: 06/13/2021 PT Individual Time: 8250-5397 PT Individual Time Calculation (min): 59 min   Short Term Goals: Week 2:  PT Short Term Goal 1 (Week 2): Pt will perform sit<>stands using LRAD with min assist PT Short Term Goal 2 (Week 2): Pt will perform bed<>chair transfers using LRAD with min assist PT Short Term Goal 3 (Week 2): Pt will ambulate at least 65ft using LRAD with max assist of 1 and +2 min assist PT Short Term Goal 4 (Week 2): Pt will participate in [place] Test demonstrating improvement in score by at least 7 points indicating decreased fall risk  Skilled Therapeutic Interventions/Progress Updates:    Pt received seated in w/c and agreeable to therapy session. Pt already wearing R LE Thuasne Sprystep PLS AFO.  Transported to/from gym in w/c for time management and energy conservation. Sit>stand w/c>B UE support on litegait handles with min assist for rising to stand and guarding/blocking R knee - no knee buckle but will start to "sag" into a flexed posture due to poor sustained quad contraction. Standing with CGA and R knee guard while +2 assist donned litegait harness total assist for time management.   Gait training overground using litegait harness ~114ft x FOUR!! repetitions as described below with overall a mod assist for facilitating weight shifting L and managing R LE gait mechanics (primarily for swing phase advancement): 1st trial: B UE support with minimal partial BWS through harness for safety - continued to require cuing for increased L weight shift to allow R swing phase advancement as well as increased R weight shift while activating R quad/glute in stance to allow increased L LE step length to achieve reciprocal pattern (as opposed to step-to leading with R LE) 2nd: same of  BUE support with minimal partial BWS - same cuing as above  with pt demoing significant improvement in R stance control with full knee extension >75% of the time without assistance as well as improved swing phase advancement with pt only requiring mod/max assist to advance <70% of the time to achieve reciprocal step 3rd: only R UE support with minimal partial BWS - same cuing as above with pt continuing to demonstrate ability to weight shift L during stance (without compensating by pulling with L UE as she was not using it) and advance R LE during swing with mod assist <70% of the time 4th: B UE support but no BWS (harness just tightened to take out the slack) required increased assistance overall with this to facilitate L weight shift onto stance limb and more consistent ~100% of the time mod/max assist to advance R LE during swing   Pt will benefit from continued gait training with decreased postural stability to further challenge her dynamic balance and ability to weight shift onto stance limb during gait.   Standing as described above doffed harness. Transported back to room and pt left seated in w/c with needs in reach,  RUE supported on 1/2 lap tray, and seat belt alarm on.  Therapy Documentation Precautions:  Precautions Precautions: Fall, Other (comment) Precaution Comments: R hemiparesis, R LE extensor tone Restrictions Weight Bearing Restrictions: No   Pain: No specific reports of pain throughout session.    Therapy/Group: Individual Therapy  Tawana Scale , PT, DPT, NCS, CSRS  06/13/2021, 3:12 PM

## 2021-06-13 NOTE — Progress Notes (Signed)
Occupational Therapy Session Note  Patient Details  Name: Terri Werner MRN: [id number] Date of Birth: [birth year]  Today's Date: 06/13/2021 OT Individual Time: 0800-0902 OT Individual Time Calculation (min): 62 min    Short Term Goals: Week 2:  OT Short Term Goal 1 (Week 2): Patient will complete stand-pivot transfer with MaxAx1 person to the 3:1. OT Short Term Goal 2 (Week 2): Pt will donning underpants and pants with mod assist sit to stand. OT Short Term Goal 3 (Week 2): Pt will complete LB bathing sit to stand with mod assist sit to stand in the shower.  Skilled Therapeutic Interventions/Progress Updates:    Session 1: [id number])  Pt in the process of toileting with the NT using the Stedy.  Therapist took over and assisted her with removal of her tear away brief.  Pt with one LOB to the right while sitting on the regular toilet, resulting in the need for max assist to correct.  Instructed pt to be sure that she sits on the 3:1 from now on and not the regular toilet.  Also changed safety plan and notified NT to this as well.  She used the [facility] for sit to stand from the toilet with mod assist and total assist for transfer over to the tub bench.  She needed max assist for scooting over onto the bench and into the walk-in shower.  Once in, she was able to finish removing UB clothing with setup and then gripper socks with min guard.  She was able to complete all bathing sit to stand with mod assist.  Increased right knee flexion noted in standing with forward LOB while supporting herself with the rails.  After completion of washing and drying she needed max assist for transfer stand pivot to the wheelchair from the tub bench.  She then completed dressing with min assist for donning her bra and supervision for pullover shirt.  She completed donning brief and pants sit to stand with max assist.  Total assist was needed for donning her TEDs, shoes, and AFO on the right foot.  She was left  with the call button and phone in reach with safety alarm belt in place.    Session 2: [id number]) Took pt down to the ortho gym via wheelchair where she worked on sit to stand and stand to sit transitions, standing balance, and pre-gait stepping while positioned next to a wall on the left side.  Had pt focus on maintaining contact with the wall with her shoulder and sometimes hip in order to decrease pushing to the right.  Therapist provided max demonstrational cueing to maintain right knee extension and activation secondary to motor impersistence.  Mod facilitation needed for sit to stand with mod to max for stand to sit secondary to increased pushing and LOB to the right and posteriorly.  Worked on trying to bend forward at the waist and let the left knee flex for transitions from squat to stand with mod assist.  Also worked on taking small steps forward and back with the LLE with max assist while not pushing too far over to the right. Returned to the room at the end of the session via wheelchair where he was left with the call button and phone in reach.     Therapy Documentation Precautions:  Precautions Precautions: Fall, Other (comment) Precaution Comments: R hemiparesis, R LE extensor tone Restrictions Weight Bearing Restrictions: No  Pain: Pain Assessment Pain Scale: Faces Pain Score: 0-No pain ADL: See Care Tool  Section for some details of mobility and selfcare   Therapy/Group: Individual Therapy  Laurissa Cowper OTR/L 06/13/2021, 12:13 PM

## 2021-06-13 NOTE — Progress Notes (Signed)
Physical Therapy Session Note ° °Patient Details  °Name: Terri Werner °MRN: [id number] °Date of Birth: 09/16/1950 ° °Today's Date: 06/13/2021 °PT Individual Time: 0915-0955 °PT Individual Time Calculation (min): 40 min  ° °Short Term Goals: °Week 1:  PT Short Term Goal 1 (Week 1): Pt will perform supine<>sit with mod assist of 1 °PT Short Term Goal 1 - Progress (Week 1): Met °PT Short Term Goal 2 (Week 1): Pt will perform sit<>stands using LRAD with mod assist of 1 °PT Short Term Goal 2 - Progress (Week 1): Met °PT Short Term Goal 3 (Week 1): Pt will perform bed<>chair transfers using LRAD with mod assist of 1 °PT Short Term Goal 3 - Progress (Week 1): Met °PT Short Term Goal 4 (Week 1): Pt will ambulate at least 30ft using LRAD with +2 assist °PT Short Term Goal 4 - Progress (Week 1): Met °Week 2:  PT Short Term Goal 1 (Week 2): Pt will perform sit<>stands using LRAD with min assist °PT Short Term Goal 2 (Week 2): Pt will perform bed<>chair transfers using LRAD with min assist °PT Short Term Goal 3 (Week 2): Pt will ambulate at least 50ft using LRAD with max assist of 1 and +2 min assist °PT Short Term Goal 4 (Week 2): Pt will participate in [place] Balance Test demonstrating improvement in score by at least 7 points indicating decreased fall risk ° °Skilled Therapeutic Interventions/Progress Updates:  ° Received pt sitting in WC, pt agreeable to PT treatment, and denied any pain at beginning of session but reported increased back pain with standing activities - R AFO donned throughout session. Pt requested to brush teeth - did so with supervision with emphasis on functional use of RUE. Session with emphasis on functional mobility/transfers, generalized strengthening, NMR, dynamic standing balance/coordination, and improved endurance with activity. Pt transported to/from room in WC dependently for time management purposes. Sit<>stand with L handrail in hallway with RUE supported around therapist. Worked on  squats 2x10 with mod A increasing to max A with fatigue using mirror for visual feedback with therapist blocking R knee from buckling with emphasis on quad strength - pt required cues for L lateral weight shifting and to shift weight onto heels. Transitioned to standing L lateral weight shifting hooking horseshoes from railing onto L corner of mirror with LUE and max A with total A to prevent R hip/knee ER and to guard R knee from buckling. Pt with multiple LOB to R requiring max/total A to correct. Pt reported not being able to feel anything on RLE and c/o increased back pain when standing - provided rest breaks for relief. Concluded session with pt sitting in WC, needs within reach, and seatbelt alarm on. Placed K-pad along back for pain relief.  ° °Therapy Documentation °Precautions:  °Precautions °Precautions: Fall, Other (comment) °Precaution Comments: R hemiparesis, R LE extensor tone °Restrictions °Weight Bearing Restrictions: No ° °Therapy/Group: Individual Therapy °Anna M Johnson °Anna Johnson PT, DPT  ° °06/13/2021, 7:31 AM  °

## 2021-06-14 NOTE — Progress Notes (Signed)
Occupational Therapy Session Note  Patient Details  Name: Terri Werner MRN: [id number] Date of Birth: [birth year]  Today's Date: 06/13/2021 OT Individual Time: 6789- 3810    OT Individual Time Calculation (min): 44 min    Short Term Goals: Week 2:  OT Short Term Goal 1 (Week 2): Patient will complete stand-pivot transfer with MaxAx1 person to the 3:1. OT Short Term Goal 2 (Week 2): Pt will donning underpants and pants with mod assist sit to stand. OT Short Term Goal 3 (Week 2): Pt will complete LB bathing sit to stand with mod assist sit to stand in the shower.  Skilled Therapeutic Interventions/Progress Updates:     Pt sitting up in w/c, agreeable to working with OT.  C/o mild low back pain during standing tasks throughout session.  Nurse made aware.  Offered rest breaks, repositioning, and heating Kpad to address pain.  Pt reported she needs to use the bathroom.  Transported to bathroom and completed stand pivot w/c to [facility] with mod assist +2 using grab bars and assist to manually position RLE when stepping back.  3/3 toileting completed with mod assist +2 in order to maintain balance with BUE release.  Pt had difficulty grasping pants to pull up/down over hips on right side due to impaired proprioception of RUE.  Pt required max assist +2 stand pivot toward affected side BSC to w/c.     Transported pt to sink via w/c where she stood with mod assist +2 to wash hands.  Pt tolerated standing x approximately 4 minutes. Utilized [employer], engineering for visual feedback, targets to increase external focus, and multimodal cues to improve postural alignment, self righting to midline, and improved RLE engagement. Pt reported she doesn't understand why she cant correct her right sided lean.    Agreeable to blocked practicing sit<>stand and static standing in front of body length mirror to promote improved postural control and balance.  Pt required mod assist +2 sit to stand and static standing with  multimodal cues and external targets to translate pelvis laterally to the left and engage RLE for improved weight bearing. Pt able to maintain standing balance using RW initially with min assist +2 then faded to mod assist +2 likely due to impaired standing endurance. Pt also provided visual demo of forward trunk lean during sit<>stand with pt needing only min assist +2 for safety after training. Call bell in reach, seat alarm on at end of session.  Therapy Documentation Precautions:  Precautions Precautions: Fall, Other (comment) Precaution Comments: R hemiparesis, R LE extensor tone Restrictions Weight Bearing Restrictions: No    Therapy/Group: Individual Therapy  Ezekiel Slocumb 06/14/2021, 8:58 AM

## 2021-06-14 NOTE — Progress Notes (Signed)
Orthopedic Tech Progress Note Patient Details:  Terri Werner 07/03/1950 [id number]  Called in order to [facility] for an AFO CONSULT  Patient ID: Terri Werner, female   DOB: 1951-02-15, 71 y.o.   MRN: [id number]  Terri Werner 06/14/2021, 10:51 AM

## 2021-06-14 NOTE — Progress Notes (Signed)
Occupational Therapy Session Note  Patient Details  Name: Terri Werner MRN: [id number] Date of Birth: 10-Jun-1950  Today's Date: 06/14/2021 OT Individual Time: 5916-3846 OT Individual Time Calculation (min): 69 min    Short Term Goals: Week 2:  OT Short Term Goal 1 (Week 2): Patient will complete stand-pivot transfer with MaxAx1 person to the 3:1. OT Short Term Goal 2 (Week 2): Pt will donning underpants and pants with mod assist sit to stand. OT Short Term Goal 3 (Week 2): Pt will complete LB bathing sit to stand with mod assist sit to stand in the shower.  Skilled Therapeutic Interventions/Progress Updates:    Pt in wheelchair to start with request for toileting and showering this session.  Charlaine Dalton was used for transfer to the 3:1 over the toilet secondary to setup of the bathroom.  Sit to stand on the Stedy was at min assist level.  She was able to complete clothing management and toilet hygiene in the [place] with mod assist before transferring over to the tub bench.  She transferred over to the tub bench with the [facility] and max assist.  Shower was completed in sitting only with use of a LH sponge for washing the lower legs, feet, and back.  She was able to dry off and then complete stand pivot transfer to the wheelchair with max assist stand pivot.  Increased right lean with max assist needed to advance the RLE with stepping.  She was able to complete UB dressing with supervision, but needed max assist for LB sit to stand secondary to difficulty donning clothing over the RLE.  Total assist for donning the right shoe with max mod assist to donn the left.  She finished session with completion of oral hygiene from the wheelchair with setup.  Call button and phone in reach with safety alarm belt in place.    Therapy Documentation Precautions:  Precautions Precautions: Fall, Other (comment) Precaution Comments: R hemiparesis, R LE extensor tone Restrictions Weight Bearing Restrictions:  No  Pain: Pain Assessment Pain Scale: Faces Pain Score: 0-No pain ADL:    Therapy/Group: Individual Therapy  Hudson Lehmkuhl OTR/L 06/14/2021, 10:33 AM

## 2021-06-14 NOTE — Progress Notes (Signed)
Physical Therapy Session Note  Patient Details  Name: Terri Werner MRN: [id number] Date of Birth: 03/27/1951  Today's Date: 06/14/2021 PT Individual Time: 7829-5621 PT Individual Time Calculation (min): 68 min   Short Term Goals: Week 2:  PT Short Term Goal 1 (Week 2): Pt will perform sit<>stands using LRAD with min assist PT Short Term Goal 2 (Week 2): Pt will perform bed<>chair transfers using LRAD with min assist PT Short Term Goal 3 (Week 2): Pt will ambulate at least 76ft using LRAD with max assist of 1 and +2 min assist PT Short Term Goal 4 (Week 2): Pt will participate in Berg Balance Test demonstrating improvement in score by at least 7 points indicating decreased fall risk  Skilled Therapeutic Interventions/Progress Updates:  Pt received sitting in w/c and agreeable to therapy session. Pt already wearing R LE Thuasne Sprystep PLS AFO.  Transported to/from gym in w/c for time management and energy conservation. Sit>stand w/c>B UE support on litegait handles with min assist while guarding/blocking R knee. Standing with min assist guarding R knee (unable to sustain quad activation) while +2 donned litegait harness total assist.  Gait training using litegait harness for balance support but not body weight support (BWS) with BUE support on handles ~134ft x3 as described below - +2 assist throughout for litegait management: 1st trial: pt with more impaired motor planning this round with decreased R glute/quad activation during stance resulting in lack of R knee extension and control resulting in decreased R stance time with short L step length causing step-to pattern as well as more impaired L weight shift during L stance requiring heavier facilitation for weight shifting and max assist to advance R LE during swing - overall requires max assist for balance and R LE gait mechanics 2nd & 3rd trial: did provide very slight partial BWS during 2nd trial for pain management until pt able  to achieve more normalized gait mechanics - pt with overall improved R stance control with increased glute/quad activation to extend R knee resulting in increased L step length for reciprocal pattern as well as improved L weight shifting allowing R swing phase advancement with mod assist and intermittent min assist (pt now able to advance R LE ~50% of the way during swing ~30-50% of the time)- overall continues to require a steady mod assist for balance and R LE gait mechanics   4th trial: transitioned to using BUE support on RW while in litegait harness for balance support and safety - requires heavier max progressed to heavier mod assist to facilitate L weight shift and R LE swing phase advancement while pt continues to be able to maintain R knee control during stance without facilitation/assist - when allowing pt increased time to motor plan the weight shift she is more successful and can advance R LE ~50% of the way - therapist assisting with AD management and pt continuously needing the support of the litegait harness to prevent LOB and maintain upright   Pt continues to demo significantly impaired awareness of where midline is oriented (especially during static standing) and which direction she is leaning towards (majority of the time she leans R anteriorly with excessive thoracic/hip extension, almost "bowing" her body backwards). At end of gait training, allowed pt to stand in litegait harness with B UE support on RW with attempts at allowing pt to feel her lean without therapist manually facilitating midline - attempted to train pt to be aware and reorient to midline with this being very challenging.  Standing as described above doffed litegait harness. Transported back to room. Pt left seated in w/c with needs in reach and seat belt alarm on.   Therapy Documentation Precautions:  Precautions Precautions: Fall, Other (comment) Precaution Comments: R hemiparesis, R LE extensor  tone Restrictions Weight Bearing Restrictions: No   Pain:  Continues to report chronic low back pain as well as some R knee pain/discomfort; however, pt declining need for intervention and motivated to continue with therapy session - therapist did provide pt with some partial BWS on 2nd gait trial while pt re-learned how to activate R LE muscles during stance for joint support and pain management.    Therapy/Group: Individual Therapy  Ginny Forth , PT, DPT, NCS, CSRS 06/14/2021, 5:56 PM

## 2021-06-14 NOTE — Progress Notes (Signed)
PROGRESS NOTE   Subjective/Complaints:  Had good morning with therapy, discussed CLBP, states that lumbar RF only gave 6wk relief  Pain ok with prn muscle relaxers   ROS: Patient denies CP, SOB, N/V/D  Objective:   No results found. No results for input(s): WBC, HGB, HCT, PLT in the last 72 hours.  No results for input(s): NA, K, CL, CO2, GLUCOSE, BUN, CREATININE, CALCIUM in the last 72 hours.   Intake/Output Summary (Last 24 hours) at 06/14/2021 1029 Last data filed at 06/14/2021 0900 Gross per 24 hour  Intake 890 ml  Output --  Net 890 ml         Physical Exam: Vital Signs Blood pressure 130/89, pulse (!) 58, temperature 97.6 F (36.4 C), resp. rate 18, height 5' 2" (1.575 m), weight 103.2 kg, SpO2 100 %.  General: No acute distress Mood and affect are appropriate Heart: Regular rate and rhythm no rubs murmurs or extra sounds Lungs: Clear to auscultation, breathing unlabored, no rales or wheezes Abdomen: Positive bowel sounds, soft nontender to palpation, nondistended Extremities: No clubbing, cyanosis, or edema Skin: No evidence of breakdown, no evidence of rash Neurologic: Cranial nerves II through XII intact, motor strength is 5/5 in bilateral deltoid, bicep, tricep, grip, 3/5 Right knee ext 0/5 RIght foot and ankle  and 5/5/ Left hip flexor, knee extensors, ankle dorsiflexor and plantar flexor Sensory exam normal sensation to light touch and proprioception in bilateral upper and lower extremities Cerebellar exam normal finger to nose to finger as well as heel to shin in bilateral upper and lower extremities Musculoskeletal: Full range of motion in all 4 extremities. No joint swelling Psych: pleasant and cooperative    Assessment/Plan: 1. Functional deficits which require 3+ hours per day of interdisciplinary therapy in a comprehensive inpatient rehab setting. Physiatrist is providing close team supervision  and 24 hour management of active medical problems listed below. Physiatrist and rehab team continue to assess barriers to discharge/monitor patient progress toward functional and medical goals  Care Tool:  Bathing    Body parts bathed by patient: Right arm, Left arm, Chest, Abdomen, Front perineal area, Buttocks, Right upper leg, Left upper leg, Face, Right lower leg, Left lower leg   Body parts bathed by helper: Buttocks, Left lower leg, Right lower leg     Bathing assist Assist Level: Moderate Assistance - Patient 50 - 74% (shower with grab bars)     Upper Body Dressing/Undressing Upper body dressing   What is the patient wearing?: Bra, Pull over shirt    Upper body assist Assist Level: Minimal Assistance - Patient > 75%    Lower Body Dressing/Undressing Lower body dressing      What is the patient wearing?: Pants, Underwear/pull up     Lower body assist Assist for lower body dressing: Maximal Assistance - Patient 25 - 49%     Toileting Toileting    Toileting assist Assist for toileting: Maximal Assistance - Patient 25 - 49% (in [place])     Transfers Chair/bed transfer  Transfers assist     Chair/bed transfer assist level: Moderate Assistance - Patient 50 - 74% (squat pivot)     Locomotion Ambulation  Ambulation assist   Ambulation activity did not occur: Safety/medical concerns  Assist level: 2 helpers Assistive device: Lite Gait Max distance: 164f   Walk 10 feet activity   Assist  Walk 10 feet activity did not occur: Safety/medical concerns        Walk 50 feet activity   Assist Walk 50 feet with 2 turns activity did not occur: Safety/medical concerns         Walk 150 feet activity   Assist Walk 150 feet activity did not occur: Safety/medical concerns         Walk 10 feet on uneven surface  activity   Assist Walk 10 feet on uneven surfaces activity did not occur: Safety/medical concerns          Wheelchair     Assist Is the patient using a wheelchair?: Yes Type of Wheelchair: Manual    Wheelchair assist level: Dependent - Patient 0%      Wheelchair 50 feet with 2 turns activity    Assist        Assist Level: Dependent - Patient 0%   Wheelchair 150 feet activity     Assist      Assist Level: Dependent - Patient 0%   Blood pressure 130/89, pulse (!) 58, temperature 97.6 F (36.4 C), resp. rate 18, height 5' 2" (1.575 m), weight 103.2 kg, SpO2 100 %.    Medical Problem List and Plan: 1. 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 Formulized individual treatment plan based on medical history, underlying problem and comorbidities.  2.  DVT: discussed recommendation not to anti-coagulate given recent IPH and she is in agreement -DVT/anticoagulation:  Mechanical:  Antiembolism stockings, knee (TED hose) Bilateral lower extremities             -antiplatelet therapy: N/A 3. Lower back pain: kpad added. Tylenol as needed 4. Mood: Provide emotional support             -antipsychotic agents: N/A 5. Neuropsych: This patient is capable of making decisions on her own behalf. 6. Skin/Wound Care: Routine skin checks 7. Fluids/Electrolytes/Nutrition: Routine in and outs with follow-up chemistries 8.  Hypertension.  Start magnesium gluconate 2572mHS and check magnesium level tomorrow morning.Continue amlodipine 5m16maily.  Vitals:   06/14/21 0544 06/14/21 0738  BP: 118/64 130/89  Pulse: (!) 58   Resp:    Temp: 97.6 F (36.4 C)   SpO2: 100%   Controlled no dizziness 2/1  9.  Gram-negative rod UTI.  Completed Keflex-  resolved  10.  GERD.  continue Protonix 11.  Hyperlipidemia.  Rosuvastatin 5 mg daily resume at discharge. 12.  Obesity.  BMI 42.05.  Dietary follow-up 13. Constipation: continue magnesium gluconate 250m69m.  14.  Hx of lumbar spinal stenosis and spondylosis, has had Lumbar RF in past (2018) , pt reports improvement with muscle relaxers will trial  15.  Chest pain at rest (now resolved) some improvement with sitting position, pt denies hx of reflux but is on protonix , EKG 06/06/21 unchanged vs 05/27/21, in fact T waves normalized  Troponinx 2, neg Had relief  with Maalox likely GI  16. Right lower extremity pain:   Gabapentin was just increased to TID on 1/27--pain seems better 1/31. Continue with current plan.  17. Right foot drop: continue dynamic night splint qhs, using trial AFO , will order AFO consult    LOS: 14 days A FACE TO [place] Strummer Canipe 06/14/2021, 10:29 AM

## 2021-06-14 NOTE — Patient Care Conference (Signed)
Inpatient RehabilitationTeam Conference and Plan of Care Update Date: 06/14/2021   Time: 10:35 AM    Patient Name: Terri Werner      Medical Record Number: [id number]  Date of Birth: April 12, 1951 Sex: Female         Room/Bed: 5C07C/5C07C-01 Payor Info: Payor: [employer]ICARE / Plan: BCBS MEDICARE / Product Type: *No Product type* /    Admit Date/Time:  05/31/2021 12:36 PM  Primary Diagnosis:  Intraparenchymal hemorrhage of brain [facility])  Hospital Problems: Principal Problem:   Intraparenchymal hemorrhage of brain [facility])    Expected Discharge Date: Expected Discharge Date: 06/27/21  Team Members Present: Physician leading conference: Dr. Claudette Laws Social Worker Present: Lavera Guise, BSW Nurse Present: Chana Bode, RN PT Present: Casimiro Needle, PT OT Present: Perrin Maltese, OT SLP Present: Eilene Ghazi, SLP PPS Coordinator present : Fae Pippin, SLP     Current Status/Progress Goal Weekly Team Focus  Bowel/Bladder   continent to B/B last BM 1/31         Swallow/Nutrition/ Hydration             ADL's   UB selfcare at supervision level with LB bathing at mod to max assist as well as LB dressing being max assist.  Transfers stand pivot are max to total assist currently with min to mod for squat pivot transfers.  min assist level  selfcare retraining, transfer retraining, DME education, balance retraining, neuromuscular re-education   Mobility   mod/max assist bed mobility, mod assist sit<>stand, mod assist squat pivot transfers, +2 mod assist gait overground with B HHA up to 21ft with max assist for R LE management primarily for swing advancement (wearing AFO) otherwise up to 164ft using litegait harness support, not yet progressed to stair navigation training - continues to have impaired midline orientation with R lean/pushing though improving  min assist overall at ambulatory level  pt education, activity tolerance, R LE NMR, dynamic  standing balance, midline orientation and weight shifting, gait training, transfer training   Communication             Safety/Cognition/ Behavioral Observations            Pain   denies pain         Skin   skin intact  skin to remain intact        Discharge Planning:  discharging with assistance from sister, niece and daughter. Sister and niece able to [facility]. 27/7 supervision avaliable   Team Discussion: Patient post left frontal hemorrhage; patient has kinesthetic issues with right upper extremity and poor proprioception.  BP is controlled, UTI resolved and low back pain controlled.  Patient on target to meet rehab goals: yes, currently needs supervision for upper body bathing and dressing and mod assist for lower body bating and max assist for lower body dressing. Completes squat pivot transfers wit min assist but needs max assist for stand pivot transfers. Able to advance her right lower extremity and weight shifting has improved. Goals for discharge set for min assist overall.   *See Care Plan and progress notes for long and short-term goals.   Revisions to Treatment Plan:  Stair training AFO consult   Teaching Needs: Safety, transfers, toileting, medication management, secondary risk management, dietary modifications, etc  Current Barriers to Discharge: Decreased caregiver support and Home enviroment access/layout  Possible Resolutions to Barriers: Family education OP follow up services recommended DME: RW     Medical Summary Current Status: BP still labile, HR  controlled  Barriers to Discharge: Weight;Medical stability   Possible Resolutions to [employer] Focus: Still adjusting BP meds, d/c planning ongoing   Continued Need for Acute Rehabilitation Level of Care: The patient requires daily medical management by a physician with specialized training in physical medicine and rehabilitation for the following reasons: Direction of a multidisciplinary physical  rehabilitation program to maximize functional independence : Yes Medical management of patient stability for increased activity during participation in an intensive rehabilitation regime.: Yes Analysis of laboratory values and/or radiology reports with any subsequent need for medication adjustment and/or medical intervention. : Yes   I attest that I was present, lead the team conference, and concur with the assessment and plan of the team.   Chana Bode B 06/14/2021, 2:39 PM

## 2021-06-14 NOTE — Progress Notes (Signed)
Physical Therapy Session Note  Patient Details  Name: Terri Werner MRN: [id number] Date of Birth: 12-20-1950  Today's Date: 06/14/2021 PT Individual Time: 1405-1500 PT Individual Time Calculation (min): 55 min   Short Term Goals: Week 2:  PT Short Term Goal 1 (Week 2): Pt will perform sit<>stands using LRAD with min assist PT Short Term Goal 1 - Progress (Week 2): Progressing toward goal PT Short Term Goal 2 (Week 2): Pt will perform bed<>chair transfers using LRAD with min assist PT Short Term Goal 2 - Progress (Week 2): Progressing toward goal PT Short Term Goal 3 (Week 2): Pt will ambulate at least 69f using LRAD with max assist of 1 and +2 min assist PT Short Term Goal 3 - Progress (Week 2): Progressing toward goal PT Short Term Goal 4 (Week 2): Pt will participate in [place] demonstrating improvement in score by at least 7 points indicating decreased fall risk PT Short Term Goal 4 - Progress (Week 2): Not met  Skilled Therapeutic Interventions/Progress Updates:   Pt received sitting in WC and agreeable to PT. [facility] min-mod assist. Once in standing pt performed lateral weight shift R and L with min-mod assist x 5 bil. Stepping in RW x 4 Bil with max cues for and assist for adequate weight shift to the L. Pt performed pregait stepping in parallel bars x 8 Bil with max cues for weight shift and midline orientation. Able to advance the RUE 50% to steps. Gait with RW 4 x 533fwith max assist and max cues for weigh shift to the L to allow advancement of the RLE with max cues for safety midline awareness. Pt then performed WC mobility x 10050fith min assist and moderate cues for attention to the RUE to prevent veer to the R. Patient returned to room and left [facility] in reach and all needs met.     Therapy Documentation Precautions:  Precautions Precautions: Fall, Other (comment) Precaution  Comments: R hemiparesis, R LE extensor tone Restrictions Weight Bearing Restrictions: No  Pain  denies    Therapy/Group: Individual Therapy  AusLorie Phenix1/2023, 4:10 PM

## 2021-06-14 NOTE — Progress Notes (Addendum)
Patient ID: Terri Werner, female   DOB: [birth year], 71 y.o.   MRN: [id number]  Team Conference Report to Patient/Family  Team Conference discussion was reviewed with the patient and caregiver, including goals, any changes in plan of care and target discharge date.  Patient and caregiver express understanding and are in agreement.  The patient has a target discharge date of 06/27/21.  Sw met with patient and daughter, provided team conference update. Patient and daughter requesting transportation resources for appointments. Sw will follow up with [facility]. SW inform pt and daughter of recommendations closer to discharge. Sw will e-mail resources to daughter at: [email or web address] and [email or web address]  Dyanne Iha 06/14/2021, 2:02 PM

## 2021-06-15 NOTE — Progress Notes (Addendum)
PROGRESS NOTE   Subjective/Complaints:  Pt is ok with D/C date, night splint uncomfortable last noc   ROS: Patient denies CP, SOB, N/V/D  Objective:   No results found. No results for input(s): WBC, HGB, HCT, PLT in the last 72 hours.  No results for input(s): NA, K, CL, CO2, GLUCOSE, BUN, CREATININE, CALCIUM in the last 72 hours.   Intake/Output Summary (Last 24 hours) at 06/15/2021 0838 Last data filed at 06/15/2021 0700 Gross per 24 hour  Intake 954 ml  Output --  Net 954 ml         Physical Exam: Vital Signs Blood pressure (!) 142/84, pulse 73, temperature 97.9 F (36.6 C), temperature source Oral, resp. rate 18, height 5\' 2"  (1.575 m), weight 103.2 kg, SpO2 94 %.  General: No acute distress Mood and affect are appropriate Heart: Regular rate and rhythm no rubs murmurs or extra sounds Lungs: Clear to auscultation, breathing unlabored, no rales or wheezes Abdomen: Positive bowel sounds, soft nontender to palpation, nondistended Extremities: No clubbing, cyanosis, or edema Skin: No evidence of breakdown, no evidence of rash Neurologic: Cranial nerves II through XII intact, motor strength is 5/5 in bilateral deltoid, bicep, tricep, grip, 3/5 Right knee ext 0/5 RIght foot and ankle  and 5/5/ Left hip flexor, knee extensors, ankle dorsiflexor and plantar flexor Sensory exam normal sensation to light touch and proprioception in Left  upper and lower extremities Absent LT sensation RLE and reduce proprioception  Cerebellar exam normal finger to nose to finger as well as heel to shin in bilateral upper and lower extremities Musculoskeletal: Full range of motion in all 4 extremities. No joint swelling Psych: pleasant and cooperative    Assessment/Plan: 1. Functional deficits which require 3+ hours per day of interdisciplinary therapy in a comprehensive inpatient rehab setting. Physiatrist is providing close team  supervision and 24 hour management of active medical problems listed below. Physiatrist and rehab team continue to assess barriers to discharge/monitor patient progress toward functional and medical goals  Care Tool:  Bathing    Body parts bathed by patient: Right arm, Left arm, Chest, Abdomen, Front perineal area, Buttocks, Right upper leg, Left upper leg, Face, Right lower leg, Left lower leg   Body parts bathed by helper: Buttocks, Left lower leg, Right lower leg     Bathing assist Assist Level: Moderate Assistance - Patient 50 - 74% (shower with grab bars)     Upper Body Dressing/Undressing Upper body dressing   What is the patient wearing?: Bra, Pull over shirt    Upper body assist Assist Level: Minimal Assistance - Patient > 75%    Lower Body Dressing/Undressing Lower body dressing      What is the patient wearing?: Pants, Underwear/pull up     Lower body assist Assist for lower body dressing: Maximal Assistance - Patient 25 - 49%     Toileting Toileting    Toileting assist Assist for toileting: Maximal Assistance - Patient 25 - 49% (in [place])     Transfers Chair/bed transfer  Transfers assist     Chair/bed transfer assist level: Moderate Assistance - Patient 50 - 74% (squat pivot)     Locomotion Ambulation  Ambulation assist   Ambulation activity did not occur: Safety/medical concerns  Assist level: 2 helpers Assistive device: Lite Gait Max distance: 136ft   Walk 10 feet activity   Assist  Walk 10 feet activity did not occur: Safety/medical concerns        Walk 50 feet activity   Assist Walk 50 feet with 2 turns activity did not occur: Safety/medical concerns         Walk 150 feet activity   Assist Walk 150 feet activity did not occur: Safety/medical concerns         Walk 10 feet on uneven surface  activity   Assist Walk 10 feet on uneven surfaces activity did not occur: Safety/medical concerns          Wheelchair     Assist Is the patient using a wheelchair?: Yes Type of Wheelchair: Manual    Wheelchair assist level: Dependent - Patient 0%      Wheelchair 50 feet with 2 turns activity    Assist        Assist Level: Dependent - Patient 0%   Wheelchair 150 feet activity     Assist      Assist Level: Dependent - Patient 0%   Blood pressure (!) 142/84, pulse 73, temperature 97.9 F (36.6 C), temperature source Oral, resp. rate 18, height 5\' 2"  (1.575 m), weight 103.2 kg, SpO2 94 %.    Medical Problem List and Plan: 1. 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  DVT: discussed recommendation not to anti-coagulate given recent IPH and she is in agreement -DVT/anticoagulation:  Mechanical:  Antiembolism stockings, knee (TED hose) Bilateral lower extremities             -antiplatelet therapy: N/A 3. Lower back pain: kpad added. Tylenol as needed 4. Mood: Provide emotional support             -antipsychotic agents: N/A 5. Neuropsych: This patient is capable of making decisions on her own behalf. 6. Skin/Wound Care: Routine skin checks 7. Fluids/Electrolytes/Nutrition: Routine in and outs with follow-up chemistries 8.  Hypertension.  Start magnesium gluconate 250mg  HS and check magnesium level tomorrow morning.Continue amlodipine 5mg  daily.  Vitals:   06/15/21 0636 06/15/21 0803  BP: 115/68 (!) 142/84  Pulse: 73   Resp: 18   Temp: 97.9 F (36.6 C)   SpO2: 94%   Controlled no dizziness 2/2  9.  Gram-negative rod UTI.  Completed Keflex- resolved  10.  GERD.  continue Protonix 11.  Hyperlipidemia.  Rosuvastatin 5 mg daily resume at discharge. 12.  Obesity.  BMI 42.05.  Dietary follow-up 13. Constipation: continue magnesium gluconate 250mg   HS.  14.  Hx of lumbar spinal stenosis and spondylosis, has had Lumbar RF in past (2018) , pt reports improvement with muscle relaxers will trial  15.  Chest pain at rest (now resolved) some improvement with sitting position, pt denies hx of reflux but is on protonix , EKG 06/06/21 unchanged vs 05/27/21, in fact T waves normalized  Troponinx 2, neg Had relief with Maalox likely GI  16. Right lower extremity pain:   Gabapentin was just increased to TID on 1/27--pain seems better 1/31. Continue with current  plan.  17. Right foot drop: continue dynamic night splint qhs,pt states it was uncomfortable "cutting into foot" will ask PT to assess fit ,  using trial AFO , will order AFO consult    LOS: 15 days A FACE TO FACE EVALUATION WAS PERFORMED  Charlett Blake 06/15/2021, 8:38 AM

## 2021-06-15 NOTE — Progress Notes (Signed)
Occupational Therapy Session Note  Patient Details  Name: Terri Werner MRN: [id number] Date of Birth: 08-26-1950  Today's Date: 06/15/2021 OT Individual Time: 0930-1000 OT Individual Time Calculation (min): 30 min    Short Term Goals: Week 1:  OT Short Term Goal 1 (Week 1): Donn UB clothing items with ModA OT Short Term Goal 1 - Progress (Week 1): Met OT Short Term Goal 2 (Week 1): Patient to complete sit to stand with functional task performance with ModA OT Short Term Goal 2 - Progress (Week 1): Met OT Short Term Goal 3 (Week 1): Patient will complete squat-pivot transfer with MaxAx1 person OT Short Term Goal 3 - Progress (Week 1): Met OT Short Term Goal 4 (Week 1): Pt will attend to RUE in relation to placement during functional task performance OT Short Term Goal 4 - Progress (Week 1): Met Week 2:  OT Short Term Goal 1 (Week 2): Patient will complete stand-pivot transfer with MaxAx1 person to the 3:1. OT Short Term Goal 2 (Week 2): Pt will donning underpants and pants with mod assist sit to stand. OT Short Term Goal 3 (Week 2): Pt will complete LB bathing sit to stand with mod assist sit to stand in the shower. Week 3:     Skilled Therapeutic Interventions/Progress Updates:    1:1 Pt was up in w/c when arrived. Donned AFO and shoes with total A. Self propelled to the sink with mod A for management of right LE. Pt set up toothbrush with toothpaste in sitting. Came into standing with min A and then mod A to find and maintain standing balance at the sink. Tactile and verbal cues to obtain and sustain right hip and knee terminal extension and to maintain balance at midline (mirror also used for visual feedback). Pt does require verbal cues to weight shift towards her left to maintain balance. Pt used right hand to brush teeth but hand can be clumsy at times trying to put caps on toothpaste and swish bottle. Returned to sitting after completion of task. Pt used bilateral hands to  empty out flower vases. Transferred to toilet in bathroom via STEADY with contact guard to come into standing but mod A to place right foot in correct place. Pt able to pull down underwear with steadying A with one UE support at time.  Passed off to next therapist in the room.   Therapy Documentation Precautions:  Precautions Precautions: Fall Precaution Comments: R hemiparesis, R LE extensor tone Restrictions Weight Bearing Restrictions: No  Pain: Pt reports ongoing pain in lower back - allowed for rest breaks as needed. Also encouraged use of heating pad when sitting in w/c (not just in bed) in between therapies.    Therapy/Group: Individual Therapy  Willeen Cass [facility] 06/15/2021, 2:49 PM

## 2021-06-15 NOTE — Progress Notes (Signed)
Occupational Therapy Session Note  Patient Details  Name: Terri Werner MRN: [id number] Date of Birth: 02-15-1951  Today's Date: 06/15/2021 OT Individual Time: 1003-1057 OT Individual Time Calculation (min): 54 min    Short Term Goals: Week 2:  OT Short Term Goal 1 (Week 2): Patient will complete stand-pivot transfer with MaxAx1 person to the 3:1. OT Short Term Goal 2 (Week 2): Pt will donning underpants and pants with mod assist sit to stand. OT Short Term Goal 3 (Week 2): Pt will complete LB bathing sit to stand with mod assist sit to stand in the shower.  Skilled Therapeutic Interventions/Progress Updates:    Sessioin 1: ([id number])  Pt completed transfer from the 3:1 on the toilet to the shower bench with use of the Stedy and total assist for bathing.  Shower bench had to be moved out with pt having to sit far back on the seat.  After turning around and scooting over on the bench with mod assist, therapist had her use the grab bars to stand up so the seat could be moved further in the shower.  She was able to complete all bathing with supervision in sitting.  She was able to lean forward and to the side for washing her buttocks and did not want to try and stand to do any further.  Once she dried off, she was able to transfer out to the wheelchair with max assist stand pivot and work on dressing tasks at the sink.  Mod instructional cueing for technique to lift the RLE off of the floor and hold using the LLE, while donning brief and pants.  She was able to donn both with mod assist sit to stand.  Total assist for TEDs as well as right shoe with AFO, but she was able to donn the left shoe and tie it with setup, when propped up on the foot rest of the couch.  She was able to also donn her bra and pullover shirt with increased time and supervision.  Finished session with pt resting in the wheelchair and with the call button and phone in reach and safety alarm belt in place.    Session 2:  [phone])  Pt up in the wheelchair to start.  Therapist pushed her down to the ortho gym for work on standing balance while incorporating the RUE for functional reach.  She was able to complete several sit to stand intervals with the RW for support, while engaged in [employer] for two mins each.   She needed mod assist for sustained standing balance during intervals secondary to increased right knee flexion and forward rotation to the left.  She was able to demonstrate accuracy in the 56-57% range with average reaction time of 2.6-2.8 seconds between each dot, while having to divide attention between task and balance.  Pt still with increased motor impersistence in the right knee with difficulty maintaining sustained knee extension.  Finished session with transport back to the room via wheelchair with pt left sitting up for upcoming PT session.  Call button and phone in reach with safety alarm belt in place.    Therapy Documentation Precautions:  Precautions Precautions: Fall Precaution Comments: R hemiparesis, R LE extensor tone Restrictions Weight Bearing Restrictions: No  Pain: Pain Assessment Pain Scale: Faces Pain Score: 0-No pain ADL: See Care Tool Section for some details of mobility and selfcare   Therapy/Group: Individual Therapy  Harl Wiechmann OTR/L 06/15/2021, 12:07 PM

## 2021-06-15 NOTE — Evaluation (Signed)
Recreational Therapy Assessment and Plan  Patient Details  Name: Terri Werner MRN: [id number] Date of Birth: 14-Jul-1950 Today's Date: 06/15/2021  Rehab Potential:  Good ELOS:   d/c 2/14  Assessment  Hospital Problem: Principal Problem:   Intraparenchymal hemorrhage of brain [facility])     Past Medical History:      Past Medical History:  Diagnosis Date   Arthritis      pt reports in back & knees   Colon polyps 2010    [facility]   Diverticulitis 2016   Genital warts     GERD (gastroesophageal reflux disease)     Heart murmur     Hepatitis B     History of blood transfusion      during each major surgery per pt   History of chicken pox     History of hiatal hernia     History of torn meniscus of right knee     HTN (hypertension)     Hx of migraine headaches     Hypertension     Hypopotassemia      Past Surgical History:       Past Surgical History:  Procedure Laterality Date   ABDOMINAL HYSTERECTOMY   2006   APPENDECTOMY   1980   CESAREAN SECTION   1981   COLONOSCOPY WITH PROPOFOL N/A 11/01/2014    Procedure: COLONOSCOPY WITH PROPOFOL;  Surgeon: Scot Jun, MD;  Location[facility] ENDOSCOPY;  Service: Endoscopy;  Laterality: N/A;   COLONOSCOPY WITH PROPOFOL N/A 01/05/2020    Procedure: COLONOSCOPY WITH PROPOFOL;  Surgeon: Regis Bill, MD;  Location: [facility] ENDOSCOPY;  Service: Endoscopy;  Laterality: N/A;   ECTOPIC PREGNANCY SURGERY   1980   HERNIA REPAIR   2006    Umbilical   KNEE ARTHROSCOPY Left 2006    torn meniscus   TONSILLECTOMY   1971   TOTAL ABDOMINAL HYSTERECTOMY W/ BILATERAL SALPINGOOPHORECTOMY   2006   VESICOVAGINAL FISTULA CLOSURE W/ TAH           Clinical Impression: Patient is a 71 y.o. right-handed female with history of hepatitis B, hyperlipidemia, hypertension but ran out of her medications and did not seek a refill.  Per chart review patient lives alone.  1 level home ramped entrance.  Recently retired in November.  She has a  daughter and niece in the area.  Presented 05/27/2021 with sudden onset of right leg weakness and mild headache.  Cranial CT scan showed acute parenchymal hemorrhage in the parasagittal left frontal parietal lobes with mild edema.  No substantial mass-effect.  CT angiogram head and neck no abnormal vascularity in the region of hemorrhage no hemodynamically significant stenosis.  MRI follow-up showed no mass or infarct seen underlying the left posterior frontal hematoma.  Admission chemistries were unremarkable except glucose 103, urine drug screen negative, urine culture greater 100,000 gram-negative rods.  Patient was placed on Keflex x5 days for UTI.  EEG showed no seizure activity.  Echocardiogram with ejection fraction of 60 to 65% no wall motion abnormalities grade 1 diastolic dysfunction.  Neurology follow-up IPH likely caused by hypertension monitored closely and she was initially maintained on Cleviprex.  Tolerating a regular consistency diet.  Therapy evaluations completed due to patient's right side weakness decreased functional mobility was admitted for a comprehensive rehab program. Patient transferred to CIR on 05/31/2021 .     Pt presents with decreased activity tolerance, decreased functional mobility, decreased balance, right inattention & feelings of stress Limiting pt's independence with  leisure/community pursuits.  Met with pt today to discuss TR services including leisure education, activity analysis/modifications and stress management.  Also discussed the importance of social, emotional, spiritual health in addition to physical health and their effects on overall health and wellness.  Pt tearful through session but stated understanding and appreciation for this visit.   Plan  Min 1 TR session >20 minutes  Recommendations for other services: None   Discharge Criteria: Patient will be discharged from TR if patient refuses treatment 3 consecutive times without medical reason.  If  treatment goals not met, if there is a change in medical status, if patient makes no progress towards goals or if patient is discharged from hospital.  The above assessment, treatment plan, treatment alternatives and goals were discussed and mutually agreed upon: by patient  Terri Werner 06/15/2021, 8:39 AM

## 2021-06-15 NOTE — Progress Notes (Signed)
Physical Therapy Weekly Progress Note  Patient Details  Name: Terri Werner MRN: [id number] Date of Birth: 1951/04/18  Beginning of progress report period: June 08, 2021 End of progress report period: June 15, 2021  Today's Date: 06/15/2021 PT Individual Time: 1546-1700 PT Individual Time Calculation (min): 74 min   Patient has met 2 of 4 short term goals. Terri Werner is making steady progress with therapy demonstrating increasing independence with functional mobility. She is performing supine<>sit with mod assist, sit<>stands with min/mod assist using RW, and squat pivot transfers with min/mod assist. She is demonstrating increased muscle activation in R LE; however, continues to have difficulty with sustained quad contraction in stance and more significant weakness in hip/knee flexors. She continues to demo pusher tendencies with poor spatial awareness and midline orientation resulting in R anterior trunk lean during static standing. She is progressing well with gait training to now ambulating up to 36ft using RW with +2 assist for AD management and heavy mod assist from therapist for balance and total assist for R LE swing phase advancement. Planning for custom AFO consultation next week. Pt will benefit from continued CIR level therapist prior to D/Cing home with 24hr support from family.  Patient continues to demonstrate the following deficits muscle weakness, muscle joint tightness, and muscle paralysis, decreased cardiorespiratoy endurance, impaired timing and sequencing, abnormal tone, unbalanced muscle activation, motor apraxia, decreased coordination, and decreased motor planning, decreased midline orientation and decreased motor planning, and decreased sitting balance, decreased standing balance, decreased postural control, hemiplegia, and decreased balance strategies and therefore will continue to benefit from skilled PT intervention to increase functional  independence with mobility.  Patient progressing toward long term goals..  Continue plan of care.  PT Short Term Goals Week 2:  PT Short Term Goal 1 (Week 2): Pt will perform sit<>stands using LRAD with min assist PT Short Term Goal 1 - Progress (Week 2): Progressing toward goal (still inconsistent requiring intermittent light mod assist) PT Short Term Goal 2 (Week 2): Pt will perform bed<>chair transfers using LRAD with min assist PT Short Term Goal 2 - Progress (Week 2): Progressing toward goal (still inconsistent requiring intermittent light mod assist) PT Short Term Goal 3 (Week 2): Pt will ambulate at least 54ft using LRAD with max assist of 1 and +2 min assist PT Short Term Goal 3 - Progress (Week 2): Met PT Short Term Goal 4 (Week 2): Pt will participate in Berg Balance Test demonstrating improvement in score by at least 7 points indicating decreased fall risk PT Short Term Goal 4 - Progress (Week 2): Met Week 3:  PT Short Term Goal 1 (Week 3): Pt will perform sit<>stands using LRAD with min assist consistently PT Short Term Goal 2 (Week 3): Pt will perform bed<>chair transfers using LRAD with min assist consistently PT Short Term Goal 3 (Week 3): Pt will ambulate at least 81ft using LRAD with mod assist of 1 and +2 supervision for safety PT Short Term Goal 4 (Week 3): Pt will initiate stair training  Skilled Therapeutic Interventions/Progress Updates:  Ambulation/gait training;Community reintegration;DME/adaptive equipment instruction;Neuromuscular re-education;Psychosocial support;Stair training;UE/LE Strength taining/ROM;Wheelchair propulsion/positioning;Balance/vestibular training;Discharge planning;Functional electrical stimulation;Pain management;Skin care/wound management;Therapeutic Activities;UE/LE Coordination activities;Cognitive remediation/compensation;Disease management/prevention;Functional mobility training;Patient/family education;Splinting/orthotics;Therapeutic  Exercise;Visual/perceptual remediation/compensation   Pt received sitting in w/c and agreeable to therapy session. Pt reports dynamic night splint felt so painful last night she felt like it was "cutting my leg off." Therapist doffed pt's shoe and AFO and donned the dynamic night splint - noticed that  the night splint straps to hold her foot in place were too loose likely resulting in her foot sliding around inside the brace and getting twisted - therapist adjusted straps and asked pt to report back to tomorrow's PT how it goes tonight. Therapist assessed pt's skin because she wears the [employer]ry[employer] PLS AFO all day long during the day - pt has no signs of skin irritation and denies any pain with the brace. Pt educated on plan for AFO consult next week.  Transported to/from gym in w/c for time management and energy conservation.  L squat pivot w/c>EOM with heavy min assist for pivoting hips - pt having difficulty motor planning how to initiate transfer and remembering the head/hips relationship. Performed 2x block practice squat pivot transfers w/c<>EOM progressed to consistent min assist for rotating hips - therapist required to properly position R LE and facilitating to promote WBing while transferring.   Patient participated in [place] Balance Test and demonstrates significantly increased fall risk as noted by score of  10/56; however, this is an improvement compared to 3/10 two weeks prior (<36= high risk for falls, close to 100%; 37-45 significant >80%; 46-51 moderate >50%; 52-55 lower >25%).  Sit<>stands to/from EOM, no UE support, focusing on midline orientation and R LE NMR - therapist guarding/blocking R knee (pt has significant anterior tibia translation while rising to stand due to short stature) - min assist for balance while rising due to R lean with anterior bias  - when going to sit pt biases strong R pelvic weight shift - noticed in sitting, she has a "windswept" posture with hips toward R  and knees towards L. In standing, performed static balance each time for 10seconds with CGA for safety while pt focuses on sustaining midline orientation and R quad contraction.  Wearing AFO. Gait training 39ft + 64ft x2 using RW with +2 assist for AD management and safety while therapist provides heavy mod assist for balance due to strong R lean (due to pushing, pt responds best to verbal cuing as opposed to tactile cuing or manual facilitation, because that causes worse lean/pushing) and total assist to advance R LE during swing while preventing scissoring and preventing excessive hip external rotation - pt able to maintain R stance control without assist. When turning to sit at end of each gait trail, pt requires heavier +2 mod assist to maintain balance, manage AD, and facilitate weight shifting with R LE stepping.  At end of session, pt left seated in w/c with needs in reach and seat belt alarm on.   Therapy Documentation Precautions:  Precautions Precautions: Fall Precaution Comments: R hemiparesis, R LE extensor tone Restrictions Weight Bearing Restrictions: No   Pain:  Chronic "tingling, stabbing pain" in low back - provided increased activity with pt stating it "loosens up" during session and feels better.  Balance: Standardized Balance Assessment Standardized Balance Assessment: Berg Balance Test Berg Balance Test Sit to Stand: Needs minimal aid to stand or to stabilize Standing Unsupported: Able to stand 30 seconds unsupported (stood for 1 minute with close supervision guarding R anterior lean/LOB but no physical assistance) Sitting with Back Unsupported but Feet Supported on Floor or Stool: Able to sit safely and securely 2 minutes Stand to Sit: Uses backs of legs against chair to control descent (R pelvic weight shift) Transfers: Needs one person to assist Standing Unsupported with Eyes Closed: Needs help to keep from falling Standing Ubsupported with Feet Together: Needs  help to attain position and unable to hold  for 15 seconds From Standing, Reach Forward with Outstretched Arm: Loses balance while trying/requires external support From Standing Position, Pick up Object from Floor: Unable to try/needs assist to keep balance From Standing Position, Turn to Look Behind Over each Shoulder: Needs assist to keep from losing balance and falling Turn 360 Degrees: Needs assistance while turning Standing Unsupported, Alternately Place Feet on Step/Stool: Needs assistance to keep from falling or unable to try Standing Unsupported, One Foot in Front: Loses balance while stepping or standing Standing on One Leg: Unable to try or needs assist to prevent fall Total Score: 10   Therapy/Group: Individual Therapy  Ginny Forth , PT, DPT, NCS, CSRS  06/15/2021, 2:29 PM

## 2021-06-16 MED ORDER — MUSCLE RUB 10-15 % EX CREA
TOPICAL_CREAM | CUTANEOUS | Status: DC | PRN
  Filled 2021-06-16: qty 85

## 2021-06-16 NOTE — Progress Notes (Signed)
PROGRESS NOTE   Subjective/Complaints:  Working with OT , had back pain last noc partially relieved by Kpad , would like to try topical analgesic   ROS: Patient denies CP, SOB, N/V/D  Objective:   No results found. No results for input(s): WBC, HGB, HCT, PLT in the last 72 hours.  No results for input(s): NA, K, CL, CO2, GLUCOSE, BUN, CREATININE, CALCIUM in the last 72 hours.   Intake/Output Summary (Last 24 hours) at 06/16/2021 0906 Last data filed at 06/16/2021 0753 Gross per 24 hour  Intake 360 ml  Output --  Net 360 ml         Physical Exam: Vital Signs Blood pressure (!) 145/76, pulse 84, temperature 97.8 F (36.6 C), resp. rate 17, height 5\' 2"  (1.575 m), weight 103.2 kg, SpO2 100 %.  General: No acute distress Mood and affect are appropriate Heart: Regular rate and rhythm no rubs murmurs or extra sounds Lungs: Clear to auscultation, breathing unlabored, no rales or wheezes Abdomen: Positive bowel sounds, soft nontender to palpation, nondistended Extremities: No clubbing, cyanosis, or edema Skin: No evidence of breakdown, no evidence of rash Neurologic: Cranial nerves II through XII intact, motor strength is 5/5 in bilateral deltoid, bicep, tricep, grip, 3/5 Right knee ext 0/5 RIght foot and ankle  and 5/5/ Left hip flexor, knee extensors, ankle dorsiflexor and plantar flexor Sensory exam normal sensation to light touch and proprioception in Left  upper and lower extremities Absent LT sensation RLE and reduce proprioception  Cerebellar exam normal finger to nose to finger as well as heel to shin in bilateral upper and lower extremities Musculoskeletal: Full range of motion in all 4 extremities. No joint swelling Psych: pleasant and cooperative    Assessment/Plan: 1. Functional deficits which require 3+ hours per day of interdisciplinary therapy in a comprehensive inpatient rehab setting. Physiatrist is  providing close team supervision and 24 hour management of active medical problems listed below. Physiatrist and rehab team continue to assess barriers to discharge/monitor patient progress toward functional and medical goals  Care Tool:  Bathing    Body parts bathed by patient: Right arm, Left arm, Chest, Abdomen, Right upper leg, Left upper leg, Face, Right lower leg, Left lower leg, Front perineal area   Body parts bathed by helper: Buttocks, Left lower leg, Right lower leg     Bathing assist Assist Level: Moderate Assistance - Patient 50 - 74%     Upper Body Dressing/Undressing Upper body dressing   What is the patient wearing?: Bra, Pull over shirt    Upper body assist Assist Level: Supervision/Verbal cueing    Lower Body Dressing/Undressing Lower body dressing      What is the patient wearing?: Pants, Underwear/pull up     Lower body assist Assist for lower body dressing: Moderate Assistance - Patient 50 - 74%     Toileting Toileting    Toileting assist Assist for toileting: Maximal Assistance - Patient 25 - 49%     Transfers Chair/bed transfer  Transfers assist     Chair/bed transfer assist level: Moderate Assistance - Patient 50 - 74% (min/mod A squat pivot)     Locomotion Ambulation   Ambulation  assist   Ambulation activity did not occur: Safety/medical concerns  Assist level: 2 helpers (heavy mod A of 1 and +2 min A for AD) Assistive device: Walker-rolling Max distance: 64ft   Walk 10 feet activity   Assist  Walk 10 feet activity did not occur: Safety/medical concerns        Walk 50 feet activity   Assist Walk 50 feet with 2 turns activity did not occur: Safety/medical concerns         Walk 150 feet activity   Assist Walk 150 feet activity did not occur: Safety/medical concerns         Walk 10 feet on uneven surface  activity   Assist Walk 10 feet on uneven surfaces activity did not occur: Safety/medical concerns          Wheelchair     Assist Is the patient using a wheelchair?: Yes Type of Wheelchair: Manual    Wheelchair assist level: Dependent - Patient 0%      Wheelchair 50 feet with 2 turns activity    Assist        Assist Level: Dependent - Patient 0%   Wheelchair 150 feet activity     Assist      Assist Level: Dependent - Patient 0%   Blood pressure (!) 145/76, pulse 84, temperature 97.8 F (36.6 C), resp. rate 17, height 5\' 2"  (1.575 m), weight 103.2 kg, SpO2 100 %.    Medical Problem List and Plan: 1. 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  DVT: discussed recommendation not to anti-coagulate given recent IPH and she is in agreement -DVT/anticoagulation:  Mechanical:  Antiembolism stockings, knee (TED hose) Bilateral lower extremities             -antiplatelet therapy: N/A 3. Lower back pain: kpad added. Tylenol as needed, order prn BenGay  4. Mood: Provide emotional support             -antipsychotic agents: N/A 5. Neuropsych: This patient is capable of making decisions on her own behalf. 6. Skin/Wound Care: Routine skin checks 7. Fluids/Electrolytes/Nutrition: Routine in and outs with follow-up chemistries 8.  Hypertension.  Start magnesium gluconate 250mg  HS and check magnesium level tomorrow morning.Continue amlodipine 5mg  daily.  Vitals:   06/15/21 1427 06/15/21 1947  BP: 124/72 (!) 145/76  Pulse: 80 84  Resp: 17 17  Temp: 98.1 F (36.7 C) 97.8 F (36.6 C)  SpO2: 100% 100%  Controlled no dizziness 2/2  9.  Gram-negative rod UTI.  Completed Keflex- resolved  10.  GERD.  continue Protonix 11.  Hyperlipidemia.  Rosuvastatin 5 mg daily resume at discharge. 12.  Obesity.  BMI 42.05.  Dietary follow-up 13. Constipation: continue  magnesium gluconate 250mg  HS.  14.  Hx of lumbar spinal stenosis and spondylosis, has had Lumbar RF in past (2018) , pt reports improvement with muscle relaxers will trial  15.  Chest pain at rest (now resolved) some improvement with sitting position, pt denies hx of reflux but is on protonix , EKG 06/06/21 unchanged vs 05/27/21, in fact T waves normalized  Troponinx 2, neg Had relief with Maalox likely GI  16. Right lower extremity pain:   Gabapentin  was just increased to TID on 1/27--pain seems better 1/31. Continue with current plan.  17. Right foot drop: continue dynamic night splint qhs,pt states it was uncomfortable "cutting into foot" will ask PT to assess fit ,  using trial AFO , will order AFO consult    LOS: 16 days A FACE TO [place] E Libra Gatz 06/16/2021, 9:06 AM

## 2021-06-16 NOTE — Progress Notes (Signed)
Physical Therapy Session Note  Patient Details  Name: Terri Werner MRN: [id number] Date of Birth: [birth year]  Today's Date: 06/16/2021 PT Individual Time: 1004-1100 PT Individual Time Calculation (min): 56 min   Short Term Goals: Week 1:  PT Short Term Goal 1 (Week 1): Pt will perform supine<>sit with mod assist of 1 PT Short Term Goal 1 - Progress (Week 1): Met PT Short Term Goal 2 (Week 1): Pt will perform sit<>stands using LRAD with mod assist of 1 PT Short Term Goal 2 - Progress (Week 1): Met PT Short Term Goal 3 (Week 1): Pt will perform bed<>chair transfers using LRAD with mod assist of 1 PT Short Term Goal 3 - Progress (Week 1): Met PT Short Term Goal 4 (Week 1): Pt will ambulate at least 2f using LRAD with +2 assist PT Short Term Goal 4 - Progress (Week 1): Met Week 2:  PT Short Term Goal 1 (Week 2): Pt will perform sit<>stands using LRAD with min assist PT Short Term Goal 1 - Progress (Week 2): Progressing toward goal (still inconsistent requiring intermittent light mod assist) PT Short Term Goal 2 (Week 2): Pt will perform bed<>chair transfers using LRAD with min assist PT Short Term Goal 2 - Progress (Week 2): Progressing toward goal (still inconsistent requiring intermittent light mod assist) PT Short Term Goal 3 (Week 2): Pt will ambulate at least 552fusing LRAD with max assist of 1 and +2 min assist PT Short Term Goal 3 - Progress (Week 2): Met PT Short Term Goal 4 (Week 2): Pt will participate in Be[place]hest demonstrating improvement in score by at least 7 points indicating decreased fall risk PT Short Term Goal 4 - Progress (Week 2): Met  Skilled Therapeutic Interventions/Progress Updates:    Pt seated in w/c on arrival and agreeable to therapy.  No complaint of pain. Pt's pastor stopped in for short visit, therapist participated in providing emotional encouragement in conjunction with pastoral care. Pt transported to therapy gym for time management and  energy conservation. Pt performed Sit to stand with and without walker. Continues to demo shift to R side with "windswept" hips. Mod improvement with addition of visual cueing from mirror. Pt directed in weight shifting to prepare for gait. Pt then Transported to hall way for gait training. Gait with hall rail, 8 x 30 ft. Short distance for improved gait mechanics and incr reps of good steps. VC for incr LLE step length and advancing in straight line vs scooping toward mid line with each step. Therapist advanced RLE, but no evidence of buckling noted. VC and mod A to sit toward L side. Pt demoes improved cadence and LLE step length with repetition and multimodal cueing. Pt returned to room and was left with all needs in reach and alarm active, lap tray in place.   Therapy Documentation Precautions:  Precautions Precautions: Fall Precaution Comments: R hemiparesis, R LE extensor tone Restrictions Weight Bearing Restrictions: No   Therapy/Group: Individual Therapy  OlMickel Fuchs/07/2021, 11:40 AM

## 2021-06-16 NOTE — Progress Notes (Signed)
Occupational Therapy Weekly Progress Note  Patient Details  Name: Terri Werner MRN: [id number] Date of Birth: [birth year]  Beginning of progress report period: June 08, 2021 End of progress report period: June 16, 2021  Today's Date: 06/16/2021 OT Individual Time: 4696-2952 OT Individual Time Calculation (min): 57 min    Patient has met 3 of 3 short term goals.  Terri Werner is making steady progress with OT at this time.  She currently completes UB selfcare with supervision in sitting with LB bathing at mod assist and LB dressing at max assist.  Sit to stand when pulling items over hips or with toileting tasks is at a mod assist level as well.  She is able to complete squat pivot transfers both directions at a min to mod assist level.  Stand pivot transfers are still max assist overall with RAFO on her foot as well.  She continues to exhibit motor impersistence, with decreased ability to maintain right knee extension.  Increased flexion is noted as well as forward rotation on the right side while standing.  RUE functional use is at a diminshed level for selfcare tasks.  She uses it functionally but at times will leave it out behind her or laying to the side, secondary to decreased spatial awareness and proprioception.  Mod instructional cueing is also needed for recall of day to day tasks such as hemi dressing techniques.  Overall, feel she is on target for established LTGs set at min assist level.  Recommend continued CIR level therapy at this time with expected discharge on 2/14.  Will continue to provide ongoing family pt/education as appropriate.    Patient continues to demonstrate the following deficits: muscle weakness and muscle paralysis, impaired timing and sequencing, abnormal tone, unbalanced muscle activation, decreased coordination, and decreased motor planning, decreased midline orientation and right side neglect, decreased problem solving and decreased memory, and  decreased sitting balance, decreased standing balance, decreased postural control, hemiplegia, and decreased balance strategies and therefore will continue to benefit from skilled OT intervention to enhance overall performance with BADL and Reduce care partner burden.  Patient progressing toward long term goals..  Continue plan of care.  OT Short Term Goals Week 3:  OT Short Term Goal 1 (Week 3): Pt will complete LB bathing min assist sit to stand. OT Short Term Goal 2 (Week 3): Pt will complete toilet transfers with mod assist stand pivot with the RW and AFO in place on the right foot. OT Short Term Goal 3 (Week 3): Pt will donn underpants and pants following hemi technique with min assist sit to stand.  Skilled Therapeutic Interventions/Progress Updates:    Session 1: [phone])  Pt in recliner to start with request to work on selfcare retraining sit to stand.  She was able to complete sit to stand in the [place] with min assist for transport to the tub bench.  Tub bench was slid out further with instructions for pt to sit back as far as possible.  Once on the seat, she was able to turn around and bring her legs over the edge of the shower with mod assist.  Increased extensor tone noted in the RLE, with therapist having to provide max assist to flex at the knee for bringing into the shower.  She was able to complete all bathing with supervision in sitting except for washing her buttocks.  Mod assist for sit to stand and to maintain standing while using the grab bars and washing her buttocks.  Once  complete, she performed stand pivot transfer to the wheelchair from the tub bench with max assist.  She then completed dressing tasks sit to stand from the wheelchair.  Supervision for donning bra and pullover shirt with mod assist for threading underpants and pants following hemi technique.  She needed total assist for TEDs, right AFO, and left shoe.  She finished session resting in the wheelchair with the call  button and phone in reach and safety alarm belt in place.    Session 2:  [phone])  Pt worked on standing balance and pre gait in the therapy gym during session.  Had her work on standing with mirror for feedback was well as wall on the left side of her to help increase weightshift to the left.  She was able to stand statically at times with min to mod assist.  Max demonstrational cueing to maintain right knee extension.  Progressed to working on stepping forward with the RW at overall mod assist level to advance and place the RLE as well as weightshift to the left.  She is able to advance the LE at times with right AFO in place, however the LE tends to adduct and scissor across the LLE.  Max assist needed for ambulation as well forward and backwards a few steps without use of the RW for support.  Completed stand pivot transfer to the therapy mat for next part of session.  Had her use the RUE for FM coordination task of nuts and bolts activity board.  She was able to remove the nuts and bolts with supervision and increased time.  She returned to the wheelchair stand pivot and then back to the room at the end of the session.  She stayed sitting up in the wheelchair with the call button and phone in reach and safety alarm belt in place.    Therapy Documentation Precautions:  Precautions Precautions: Fall Precaution Comments: R hemiparesis, R LE extensor tone Restrictions Weight Bearing Restrictions: No  Pain: Pain Assessment Pain Scale: 0-10 Pain Score: 0-No pain Faces Pain Scale: Hurts a little bit Pain Type: Acute pain Pain Location: Back Pain Orientation: Lower Pain Descriptors / Indicators: Discomfort Pain Onset: With Activity Pain Intervention(s): Repositioned ADL: See Care Tool Section for some details of mobility and selfcare   Therapy/Group: Individual Therapy  Camy Leder OTR/L 06/16/2021, 9:15 AM

## 2021-06-17 NOTE — Progress Notes (Signed)
Physical Therapy Session Note  Patient Details  Name: Terri Werner MRN: [id number] Date of Birth: 1950/07/05  Today's Date: 06/17/2021 PT Individual Time: 1010-1105 PT Individual Time Calculation (min): 55 min   Short Term Goals: Week 3:  PT Short Term Goal 1 (Week 3): Pt will perform sit<>stands using LRAD with min assist consistently PT Short Term Goal 2 (Week 3): Pt will perform bed<>chair transfers using LRAD with min assist consistently PT Short Term Goal 3 (Week 3): Pt will ambulate at least 56ft using LRAD with mod assist of 1 and +2 supervision for safety PT Short Term Goal 4 (Week 3): Pt will initiate stair training  Skilled Therapeutic Interventions/Progress Updates:    Pt received seated in w/c and agreeable to therapy session. Donned B LE TED hose, R LE [place] Sprystep PLS AFO, and B shoes total assist for time management.  Transported to/from gym in w/c for time management and energy conservation. Retrieved +2 assist.  Repeated sit<>stands w/c<>RW x8 reps focusing on R LE NMR and midline orientation in preparation for gait training - heavy min/light mod assist primarily for balance due to moderate progressed to min/mild R trunk lean - cuing for increased R quad activation and L weight shift - had some R knee instability in the initial few reps but this improved.   Gait training 67ft + 140ft + 53ft using RW with +2 providing mod progressed to min assist primarily to guide/direct AD (otherwise it would veer R) and therapist providing mod assist for balance and R LE management: - initial walk pt with much more significant R trunk lean and more impaired R swing phase advancement with both of these improving on subsequent gait trials  - total assist to advance R LE during swing to achieve reciprocal stepping pattern and position the LE, pt does initiate swing but not able to advance - therapist having to prevent scissoring and excessive hip external rotation - therapist  donned a leg loop on RLE during 3rd gait trial with much improved control over swing advancement and R foot placement - slight R knee instability noted in initial <29ft gait on 1st trial but otherwise pt with good knee stability only requiring intermittent cuing to increase knee extension for improved LE positioning so pt doesn't stay in a slight bent knee posture during stance  Transported back to room and pt left seated in w/c with needs in reach and seat belt alarm on.  Therapy Documentation Precautions:  Precautions Precautions: Fall Precaution Comments: R hemiparesis, R LE extensor tone Restrictions Weight Bearing Restrictions: No   Pain: Continues to have chronic low back pain but no intervention needed during session - seated breaks provided.    Therapy/Group: Individual Therapy  Tawana Scale , PT, DPT, NCS, CSRS  06/17/2021, 8:01 AM

## 2021-06-17 NOTE — Progress Notes (Signed)
PROGRESS NOTE   Subjective/Complaints: No new complaints this morning  Continues to have pain in right lower extremity, but commended on her improvements in ambulation! She sometimes feels the boot is very heavy  ROS: Patient denies CP, SOB, N/V/D, +right lower extremity pain  Objective:   No results found. No results for input(s): WBC, HGB, HCT, PLT in the last 72 hours.  No results for input(s): NA, K, CL, CO2, GLUCOSE, BUN, CREATININE, CALCIUM in the last 72 hours.   Intake/Output Summary (Last 24 hours) at 06/17/2021 1714 Last data filed at 06/17/2021 1300 Gross per 24 hour  Intake 1260 ml  Output --  Net 1260 ml        Physical Exam: Vital Signs Blood pressure (!) 150/90, pulse 71, temperature 98.5 F (36.9 C), resp. rate 16, height 5\' 2"  (1.575 m), weight 103.2 kg, SpO2 100 %. Gen: no distress, normal appearing HEENT: oral mucosa pink and moist, NCAT Cardio: Reg rate Chest: normal effort, normal rate of breathing Abd: soft, non-distended Ext: no edema Psych: pleasant, normal affect Skin: intact Neurologic: Cranial nerves II through XII intact, motor strength is 5/5 in bilateral deltoid, bicep, tricep, grip, 3/5 Right knee ext 0/5 RIght foot and ankle  and 5/5/ Left hip flexor, knee extensors, ankle dorsiflexor and plantar flexor Sensory exam normal sensation to light touch and proprioception in Left  upper and lower extremities Absent LT sensation RLE and reduce proprioception  Cerebellar exam normal finger to nose to finger as well as heel to shin in bilateral upper and lower extremities Musculoskeletal: Full range of motion in all 4 extremities. No joint swelling Psych: pleasant and cooperative    Assessment/Plan: 1. Functional deficits which require 3+ hours per day of interdisciplinary therapy in a comprehensive inpatient rehab setting. Physiatrist is providing close team supervision and 24 hour  management of active medical problems listed below. Physiatrist and rehab team continue to assess barriers to discharge/monitor patient progress toward functional and medical goals  Care Tool:  Bathing    Body parts bathed by patient: Right arm, Left arm, Chest, Abdomen, Right upper leg, Left upper leg, Face, Right lower leg, Left lower leg, Front perineal area   Body parts bathed by helper: Buttocks     Bathing assist Assist Level: Moderate Assistance - Patient 50 - 74%     Upper Body Dressing/Undressing Upper body dressing   What is the patient wearing?: Bra, Pull over shirt    Upper body assist Assist Level: Supervision/Verbal cueing    Lower Body Dressing/Undressing Lower body dressing      What is the patient wearing?: Pants, Underwear/pull up     Lower body assist Assist for lower body dressing: Moderate Assistance - Patient 50 - 74%     Toileting Toileting    Toileting assist Assist for toileting: Maximal Assistance - Patient 25 - 49%     Transfers Chair/bed transfer  Transfers assist     Chair/bed transfer assist level: Moderate Assistance - Patient 50 - 74% (min/mod A squat pivot)     Locomotion Ambulation   Ambulation assist   Ambulation activity did not occur: Safety/medical concerns  Assist level: 2 helpers (mod A  from therapist and +2 min/mod for AD management) Assistive device: Walker-rolling Max distance: 188ft   Walk 10 feet activity   Assist  Walk 10 feet activity did not occur: Safety/medical concerns        Walk 50 feet activity   Assist Walk 50 feet with 2 turns activity did not occur: Safety/medical concerns         Walk 150 feet activity   Assist Walk 150 feet activity did not occur: Safety/medical concerns         Walk 10 feet on uneven surface  activity   Assist Walk 10 feet on uneven surfaces activity did not occur: Safety/medical concerns         Wheelchair     Assist Is the patient using a  wheelchair?: Yes Type of Wheelchair: Manual    Wheelchair assist level: Dependent - Patient 0%      Wheelchair 50 feet with 2 turns activity    Assist        Assist Level: Dependent - Patient 0%   Wheelchair 150 feet activity     Assist      Assist Level: Dependent - Patient 0%   Blood pressure (!) 150/90, pulse 71, temperature 98.5 F (36.9 C), resp. rate 16, height 5\' 2"  (1.575 m), weight 103.2 kg, SpO2 100 %.    Medical Problem List and Plan: 1. 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  DVT: discussed recommendation not to anti-coagulate given recent IPH and she is in agreement -DVT/anticoagulation:  Mechanical:  Antiembolism stockings, knee (TED hose) Bilateral lower extremities             -antiplatelet therapy: N/A 3. Lower back pain: kpad added. Tylenol as needed, order prn BenGay  4. Mood: Provide emotional support             -antipsychotic agents: N/A 5. Neuropsych: This patient is capable of making decisions on her own behalf. 6. Skin/Wound Care: Routine skin checks 7. Fluids/Electrolytes/Nutrition: Routine in and outs with follow-up chemistries 8.  Hypertension.  Start magnesium gluconate 250mg  HS .Continue amlodipine 5mg  daily.  Vitals:   06/17/21 0541 06/17/21 1247  BP: 127/86 (!) 150/90  Pulse: 64 71  Resp: 16 16  Temp: 98.2 F (36.8 C) 98.5 F (36.9 C)  SpO2: 99% 100%   9.  Gram-negative rod UTI.  Completed Keflex- resolved  10.  GERD.  continue Protonix 11.  Hyperlipidemia.  Rosuvastatin 5 mg daily resume at discharge. 12.  Obesity.  BMI 42.05.  Dietary follow-up 13. Constipation: continue magnesium gluconate 250mg  HS.  14.  Hx of lumbar spinal stenosis and spondylosis, has had Lumbar RF in past (2018) , pt reports  improvement with muscle relaxers will trial  15.  Chest pain at rest (now resolved) some improvement with sitting position, pt denies hx of reflux but is on protonix , EKG 06/06/21 unchanged vs 05/27/21, in fact T waves normalized  Troponinx 2, neg Had relief with Maalox likely GI  16. Right lower extremity pain:   Gabapentin was just increased to TID on 1/27--pain seems better 1/31. Continue with current plan.  17. Right foot drop: continue dynamic night splint qhs,pt states it was  uncomfortable "cutting into foot" will ask PT to assess fit ,  using trial AFO , will order AFO consult    LOS: 17 days A FACE TO FACE EVALUATION WAS PERFORMED  Terri Werner Terri Werner 06/17/2021, 5:14 PM

## 2021-06-18 NOTE — Progress Notes (Signed)
Occupational Therapy Session Note  Patient Details  Name: Terri Werner MRN: [id number] Date of Birth: [birth year]  Today's Date: 06/18/2021 OT Individual Time: 1250-1350 OT Individual Time Calculation (min): 60 min    Short Term Goals: Week 3:  OT Short Term Goal 1 (Week 3): Pt will complete LB bathing min assist sit to stand. OT Short Term Goal 2 (Week 3): Pt will complete toilet transfers with mod assist stand pivot with the RW and AFO in place on the right foot. OT Short Term Goal 3 (Week 3): Pt will donn underpants and pants following hemi technique with min assist sit to stand.  Skilled Therapeutic Interventions/Progress Updates:    Pt sitting in w/c, very frustrated by "not being able to do anything" and lunch not being properly set up for her. Emotional supportive provided and pt agreeable to take shower. She completed a squat pivot transfer from the w/c to the TTB with max A. Heavy blocking for RLE extensor tone. She required mod A for removing LB clothing sit <> stand with use of the grab bar. She was able to complete all UB bathing with set up assist. Pt completed LB bathing in standing with min-mod A for balance support with the grab bar for heavy support. Pt transferred back to the w/c with max A. She completed UB dressing with (S) seated. LB dressing with mod A overall. Mod A to don socks with max facilitation into modified figure 4 position to reach RLE. Shoes with R AFO donned with max A. Pt completed grooming tasks at the sink with set up assist. Pt was left sitting up in the w/c with all needs met.   Therapy Documentation Precautions:  Precautions Precautions: Fall Precaution Comments: R hemiparesis, R LE extensor tone Restrictions Weight Bearing Restrictions: No   Therapy/Group: Individual Therapy  [name] 06/18/2021, 7:31 AM

## 2021-06-18 NOTE — Progress Notes (Signed)
Physical Therapy Session Note  Patient Details  Name: Terri Werner MRN: [id number] Date of Birth: [birth year]  Today's Date: 06/18/2021 PT Individual Time: 2458-0998 PT Individual Time Calculation (min): 55 min   Short Term Goals: Week 3:  PT Short Term Goal 1 (Week 3): Pt will perform sit<>stands using LRAD with min assist consistently PT Short Term Goal 2 (Week 3): Pt will perform bed<>chair transfers using LRAD with min assist consistently PT Short Term Goal 3 (Week 3): Pt will ambulate at least 16ft using LRAD with mod assist of 1 and +2 supervision for safety PT Short Term Goal 4 (Week 3): Pt will initiate stair training  Skilled Therapeutic Interventions/Progress Updates:    Pt received seated in w/c in room, agreeable to PT session. Pt reports ongoing chronic back pain, declines intervention. Utilized rest breaks and stretching during session for pain management. Dependent transport via w/c to/from therapy gym on 4W for time and energy conservation. Sit to stand with min to mod A to RW during session. Ambulation 2 x 5 ft, 1 x 10 ft with use of RW and mod to max A for balance and advancement of RLE, use of mirror for visual feedback for midline and lateral weight shift, use of R PLS AFO, and close w/c follow for safety. Pt exhibits significant R anterolateral lean during standing inhibiting ability to advance R limb during gait. With use of mirror pt exhibits improved weight shift onto LLE and improved ability to advance R limb. Sit to stand with 4" step under RLE to encourage weight shift to the L. While in standing with RW and use of mirror for visual feedback with 4" step under RLE pt able to perform reaching with alt UE outside BOS and across midline. Pt requires cues to maintain equal WB through BLE in stance when performing reaching task. Pt fatigues very quickly with standing activity. Pt returned to room and left seated in w/c with needs in reach at end of session.  Therapy  Documentation Precautions:  Precautions Precautions: Fall Precaution Comments: R hemiparesis, R LE extensor tone Restrictions Weight Bearing Restrictions: No      Therapy/Group: Individual Therapy   Excell Seltzer, PT, DPT, CSRS  06/18/2021, 5:05 PM

## 2021-06-19 NOTE — Progress Notes (Signed)
Occupational Therapy Session Note  Patient Details  Name: Terri Werner MRN: [id number] Date of Birth: 1951/04/11  Today's Date: 06/19/2021 OT Individual Time: 2751-7001 OT Individual Time Calculation (min): 60 min    Short Term Goals: Week 3:  OT Short Term Goal 1 (Week 3): Pt will complete LB bathing min assist sit to stand. OT Short Term Goal 2 (Week 3): Pt will complete toilet transfers with mod assist stand pivot with the RW and AFO in place on the right foot. OT Short Term Goal 3 (Week 3): Pt will donn underpants and pants following hemi technique with min assist sit to stand.  Skilled Therapeutic Interventions/Progress Updates:    Pt in wheelchair to start session with transport down to the therapy gym.  She was then able to complete transfer stand pivot with max assist to the therapy gym.  Had her start with work on sit to squat transitions while picking up checker with the right hand and placing in the [employer].  She was able to demonstrate good left weightshift while reaching.  Transitioned to working on stand to squat to reach down and pick up the checkers.  Increased pushing to the right noted with pt demonstrating decreased ability to maintain right knee extension in standing resulting in forward lean with LOB as well as rotation to the left.  Mod to max assist needed to maintain balance while completing this.  Finished with work on functional mobility with total +2 (pt 30%) without an assistive device.  Decreased ability to weightshift to the left as well as decreased ability to advance the RLE.  Increased flexor synergy noted in the RLE when trying to advance the RLE.  Max assist for correct placement in order to take a step with the LLE.  Finished session with transport back to the room where she was left sitting up in the wheelchair and with the call button and phone in reach.  Safety alarm belt in place as well.    Therapy Documentation Precautions:   Precautions Precautions: Fall Precaution Comments: R hemiparesis, R LE extensor tone Restrictions Weight Bearing Restrictions: No   Pain: Pain Assessment Pain Scale: Faces Pain Score: 0-No pain     Therapy/Group: Individual Therapy  Sacoya Mcgourty OTR/L 06/19/2021, 12:34 PM

## 2021-06-19 NOTE — Progress Notes (Signed)
Physical Therapy Session Note  Patient Details  Name: Terri Werner MRN: [id number] Date of Birth: Jun 10, 1950  Today's Date: 06/19/2021 PT Individual Time: 0802-0900 + 1400-1500  PT Individual Time Calculation (min): 58 min  + 60 min  Short Term Goals: Week 3:  PT Short Term Goal 1 (Week 3): Pt will perform sit<>stands using LRAD with min assist consistently PT Short Term Goal 2 (Week 3): Pt will perform bed<>chair transfers using LRAD with min assist consistently PT Short Term Goal 3 (Week 3): Pt will ambulate at least 72ft using LRAD with mod assist of 1 and +2 supervision for safety PT Short Term Goal 4 (Week 3): Pt will initiate stair training   Skilled Therapeutic Interventions/Progress Updates:     1st session: Pt presents supine in bed with MD finishing up morning rounding. Pt agreeable to PT tx and denies pain. Requesting assistance in getting dressed and ready for the day.   Supine<>sit with HOB raised, modA for RLE and some trunk support. Pt with some motor planning deficits during transition to EOB, requiring mod instructional cues for technique. Able to scoot anteriorly at EOB with CGA. Donned B thigh-high TED's, shoes, and R AFO with totalA for time. Completed LB dressing with maxA and UB dressing with min/modA. Pt wheeled sinkside in w/c to complete a few self care tasks at the sink (washing face, brushing hair).   Pt then transported downstairs to 65M rehab gym for time to focus remainder of session on gait training.   Sit<>stand to RW with modA with assist needed for R foot placement due to extensor tone. ModA needed for standing balance due to R truncal lean. Able to ambulate ~67ft with modA and +2 assist for w/c follow. Primary gait deficits include R adduction, R truncal leal.   2nd gait trial with RW using +2 assist for student PT sitting on stool in front of her to help guide her RLE into neutral position and advance limb in swing phase, ambulating ~42ft with  +2 modA.   3rd gait trail with EVA walker using +2 assist for student PT guiding walker from anteriorly. PT assisting with facilitating RLE swing and placement, as well as limiting adduction. EVA walker assisted with trunk support but pt would rely too heavily on it at times (leaning trunk against walker frame). Pt ambulated ~43ft + ~7ft (standing rest break) with +2 modA during this trial.  Pt transported back upstairs to her room for time. Completed session seated in w/c with safety belt alarm on, all immediate needs within reach.   2nd session: Pt presenting sitting in w/c, agreeable to PT tx. Denies pain. Transported to 65M day room rehab gym for time. Pt with R AFO already on. Setup on LiteGait with harness donned/doffed in sitting for safety. Applied minimal BWS in LiteGait for gait trials. Unable to locate Leg Lifter for gait facilitation.   Sit<>Stand in LiteGait with minA with VC needed to push from armrests rather than pull from LiteGait. Also requires assist for R foot placement prior to standing due to extensor tone and pt unable to engage hamstrings to bring R foot underneath her. MinA for standing balance in LiteGait while adjustments are being made.   Trial 1) ~39ft, modA for facilitating gait and +2 for steering LiteGait. Attached heavy resistant hip flexor cord to promote hip flexion in swing phase. Required facilitation for lateral weight shifting, trunk support, and RLE advancement in swing. Continues to adduct R LE as well.  Trial 2) ~  3ft, modA for facilitating gait and +2 for steering LiteGait. Donned shoe cover to R foot to assist with R swing phase. Improved ability to forward progress RLE but with too much hip extension, pt unable to overcome. Worked on "smaller" steps and increasing hip control for advancing gait.   Worked on unsupported dynamic standing balance while in LiteGait, no UE support - multi-plane reaching for cones to encourage weight shifting and cone tapping on  L for R stance control. Pt struggled with cone tapping and was unable to support herself on R for single leg stance, despite BWS through LiteGait. Once her postural control was lost, she had great difficulty correcting to neutral static standing despite max multi-modal cueing.   Transported back upstairs to her room. Remained seated in w/c with safety belt alarm on and all immediate needs within reach.    Therapy Documentation Precautions:  Precautions Precautions: Fall Precaution Comments: R hemiparesis, R LE extensor tone Restrictions Weight Bearing Restrictions: No General:      Therapy/Group: Individual Therapy  Orrin Brigham 06/19/2021, 7:31 AM

## 2021-06-19 NOTE — Progress Notes (Signed)
Occupational Therapy Session Note  Patient Details  Name: CARENA STREAM MRN: [id number] Date of Birth: 09-May-1951  Today's Date: 06/19/2021 OT Individual Time: 1304-1340 OT Individual Time Calculation (min): 36 min   Short Term Goals: Week 2:  OT Short Term Goal 1 (Week 2): Patient will complete stand-pivot transfer with MaxAx1 person to the 3:1. OT Short Term Goal 1 - Progress (Week 2): Met OT Short Term Goal 2 (Week 2): Pt will donning underpants and pants with mod assist sit to stand. OT Short Term Goal 2 - Progress (Week 2): Met OT Short Term Goal 3 (Week 2): Pt will complete LB bathing sit to stand with mod assist sit to stand in the shower. OT Short Term Goal 3 - Progress (Week 2): Met  Skilled Therapeutic Interventions/Progress Updates:    Pt greeted seated in wc and agreeable to OT treatment session. Pt wanted to wash up since she has not done that today. Pt brought to the sink in wc and worked on sit<>stands with R knee block and min A to power up, but then mod A for balance with occasional R knee buckle. Pt able to wash peri-area and buttocks with OT providing mod A for balance. OT issued [place] reacher and educated on tecnique to assist with donning brief and pants. Pt demonstrated understanding with cues for technique. With more practice, I think patient will utilize reacher well for threading pants. Sit<>stand in similar fashion to pull up pants, UB bathing/dressing from wc at the sink with set-up A. Addressed standing balance/endurance to stand for toothbrushing task. Pt with improved R knee control with this standing bout, requiring mostly min A for short standing bout. Pt left seated in wc at end of session with alarm belt on, call bell in reach, and needs met.   Therapy Documentation Precautions:  Precautions Precautions: Fall Precaution Comments: R hemiparesis, R LE extensor tone Restrictions Weight Bearing Restrictions: No Pain:  Denies pain   Therapy/Group:  Individual Therapy  Valma Cava 06/19/2021, 1:49 PM

## 2021-06-19 NOTE — Progress Notes (Signed)
PROGRESS NOTE   Subjective/Complaints:   No pain c/o PT in room, plan for AFO consult this week , currently using trial AFO   ROS: Patient denies CP, SOB, N/V/D, +right lower extremity pain  Objective:   No results found. No results for input(s): WBC, HGB, HCT, PLT in the last 72 hours.  No results for input(s): NA, K, CL, CO2, GLUCOSE, BUN, CREATININE, CALCIUM in the last 72 hours.   Intake/Output Summary (Last 24 hours) at 06/19/2021 0800 Last data filed at 06/18/2021 1807 Gross per 24 hour  Intake 720 ml  Output --  Net 720 ml         Physical Exam: Vital Signs Blood pressure 138/78, pulse 61, temperature 97.6 F (36.4 C), temperature source Oral, resp. rate 16, height 5\' 2"  (1.575 m), weight 103.2 kg, SpO2 100 %.  General: No acute distress Mood and affect are appropriate Heart: Regular rate and rhythm no rubs murmurs or extra sounds Lungs: Clear to auscultation, breathing unlabored, no rales or wheezes Abdomen: Positive bowel sounds, soft nontender to palpation, nondistended Extremities: No clubbing, cyanosis, or edema Skin: No evidence of breakdown, no evidence of rash   Neurologic: Cranial nerves II through XII intact, motor strength is 5/5 in bilateral deltoid, bicep, tricep, grip, 3/5 Right knee ext 0/5 RIght foot and ankle  and 5/5/ Left hip flexor, knee extensors, ankle dorsiflexor and plantar flexor Sensory exam reduced  sensation to light touch RLE but now able to ID light touch on foot! Absent LT sensation RLE and reduce proprioception  Cerebellar exam normal finger to nose to finger as well as heel to shin in bilateral upper and lower extremities Musculoskeletal: Full range of motion in all 4 extremities. No joint swelling Psych: pleasant and cooperative    Assessment/Plan: 1. Functional deficits which require 3+ hours per day of interdisciplinary therapy in a comprehensive inpatient rehab  setting. Physiatrist is providing close team supervision and 24 hour management of active medical problems listed below. Physiatrist and rehab team continue to assess barriers to discharge/monitor patient progress toward functional and medical goals  Care Tool:  Bathing    Body parts bathed by patient: Right arm, Left arm, Chest, Abdomen, Right upper leg, Left upper leg, Face, Right lower leg, Left lower leg, Front perineal area   Body parts bathed by helper: Buttocks     Bathing assist Assist Level: Moderate Assistance - Patient 50 - 74%     Upper Body Dressing/Undressing Upper body dressing   What is the patient wearing?: Bra, Pull over shirt    Upper body assist Assist Level: Supervision/Verbal cueing    Lower Body Dressing/Undressing Lower body dressing      What is the patient wearing?: Pants, Underwear/pull up     Lower body assist Assist for lower body dressing: Moderate Assistance - Patient 50 - 74%     Toileting Toileting    Toileting assist Assist for toileting: Maximal Assistance - Patient 25 - 49%     Transfers Chair/bed transfer  Transfers assist     Chair/bed transfer assist level: Moderate Assistance - Patient 50 - 74% (min/mod A squat pivot)     Locomotion Ambulation  Ambulation assist   Ambulation activity did not occur: Safety/medical concerns  Assist level: 2 helpers (mod A from therapist and +2 min/mod for AD management) Assistive device: Walker-rolling Max distance: 18ft   Walk 10 feet activity   Assist  Walk 10 feet activity did not occur: Safety/medical concerns        Walk 50 feet activity   Assist Walk 50 feet with 2 turns activity did not occur: Safety/medical concerns         Walk 150 feet activity   Assist Walk 150 feet activity did not occur: Safety/medical concerns         Walk 10 feet on uneven surface  activity   Assist Walk 10 feet on uneven surfaces activity did not occur: Safety/medical  concerns         Wheelchair     Assist Is the patient using a wheelchair?: Yes Type of Wheelchair: Manual    Wheelchair assist level: Dependent - Patient 0%      Wheelchair 50 feet with 2 turns activity    Assist        Assist Level: Dependent - Patient 0%   Wheelchair 150 feet activity     Assist      Assist Level: Dependent - Patient 0%   Blood pressure 138/78, pulse 61, temperature 97.6 F (36.4 C), temperature source Oral, resp. rate 16, height 5\' 2"  (1.575 m), weight 103.2 kg, SpO2 100 %.    Medical Problem List and Plan: 1. Functional deficits secondary to IPH 05/27/21 in the left parasagittal frontal parietal lobes likely secondary to uncontrolled hypertension RLE> RUE weakness              -patient may shower             -ELOS/Goals: MinA 2/14- some improv3ement with sensation RLE  -Continue CIR therapies including PT, OT  , 2.  DVT: discussed recommendation not to anti-coagulate given recent IPH and she is in agreement -DVT/anticoagulation:  Mechanical:  Antiembolism stockings, knee (TED hose) Bilateral lower extremities             -antiplatelet therapy: N/A 3. Lower back pain: kpad added. Tylenol as needed, order prn BenGay  4. Mood: Provide emotional support             -antipsychotic agents: N/A 5. Neuropsych: This patient is capable of making decisions on her own behalf. 6. Skin/Wound Care: Routine skin checks 7. Fluids/Electrolytes/Nutrition: Routine in and outs with follow-up chemistries 8.  Hypertension.  Start magnesium gluconate 250mg  HS .Continue amlodipine 5mg  daily.  Vitals:   06/18/21 2109 06/19/21 0528  BP: 136/74 138/78  Pulse: 77 61  Resp: 16 16  Temp: 98.6 F (37 C) 97.6 F (36.4 C)  SpO2: 100% 100%  Controlled 2/6 9.  Gram-negative rod UTI.  Completed Keflex- resolved  10.  GERD.  continue Protonix 11.  Hyperlipidemia.  Rosuvastatin 5 mg daily resume at discharge. 12.  Obesity.  BMI 42.05.  Dietary follow-up 13.  Constipation: continue magnesium gluconate 250mg  HS.  14.  Hx of lumbar spinal stenosis and spondylosis, has had Lumbar RF in past (2018) , pt reports improvement with muscle relaxers will trial  15.  Chest pain at rest (now resolved) some improvement with sitting position, pt denies hx of reflux but is on protonix , EKG 06/06/21 unchanged vs 05/27/21, in fact T waves normalized  Troponin x 2, neg Had relief with Maalox likely GI  16. Right lower extremity pain:  Gabapentin was just increased to TID on 1/27--pain seems better 1/31. Continue with current plan.  17. Right foot drop: continue dynamic night splint qhs,pt states it was uncomfortable "cutting into foot" will ask PT to assess fit ,  using trial AFO ,AFO consult may be on 2/8   LOS: 19 days A FACE TO Sharkey E Migdalia Olejniczak 06/19/2021, 8:00 AM

## 2021-06-20 MED ORDER — LIDOCAINE 5 % EX PTCH
3.0000 | MEDICATED_PATCH | CUTANEOUS | Status: DC
  Administered 2021-06-20 – 2021-06-26 (×7): 3 via TRANSDERMAL
  Filled 2021-06-20 (×8): qty 3

## 2021-06-20 MED ORDER — DICLOFENAC SODIUM 1 % EX GEL
4.0000 g | Freq: Four times a day (QID) | CUTANEOUS | Status: DC
  Administered 2021-06-20 – 2021-06-27 (×20): 4 g via TOPICAL
  Filled 2021-06-20: qty 100

## 2021-06-20 NOTE — Plan of Care (Signed)
°  Problem: RH Wheelchair Mobility Goal: LTG Patient will propel w/c in controlled environment (PT) Description: LTG: Patient will propel wheelchair in controlled environment, # of feet with assist (PT) Flowsheets (Taken 06/20/2021 1915) LTG: Pt will propel w/c in controlled environ  assist needed:: (goal added to achieve mod-I functional mobility) Independent with assistive device LTG: Propel w/c distance in controlled environment: 134ft Note: goal added to achieve mod-I functional mobility Goal: LTG Patient will propel w/c in home environment (PT) Description: LTG: Patient will propel wheelchair in home environment, # of feet with assistance (PT). Flowsheets (Taken 06/20/2021 1915) LTG: Pt will propel w/c in home environ  assist needed:: (goal added to achieve mod-I functional mobility) Independent with assistive device LTG: Propel w/c distance in home environment: 54ft Note: goal added to achieve mod-I functional mobility

## 2021-06-20 NOTE — Progress Notes (Signed)
Occupational Therapy Session Note  Patient Details  Name: Terri Werner MRN: [id number] Date of Birth: 1950-05-19  Today's Date: 06/20/2021 OT Individual Time: 7741-2878 OT Individual Time Calculation (min): 56 min    Short Term Goals: Week 1:  OT Short Term Goal 1 (Week 1): Donn UB clothing items with ModA OT Short Term Goal 1 - Progress (Week 1): Met OT Short Term Goal 2 (Week 1): Patient to complete sit to stand with functional task performance with ModA OT Short Term Goal 2 - Progress (Week 1): Met OT Short Term Goal 3 (Week 1): Patient will complete squat-pivot transfer with MaxAx1 person OT Short Term Goal 3 - Progress (Week 1): Met OT Short Term Goal 4 (Week 1): Pt will attend to RUE in relation to placement during functional task performance OT Short Term Goal 4 - Progress (Week 1): Met Week 2:  OT Short Term Goal 1 (Week 2): Patient will complete stand-pivot transfer with MaxAx1 person to the 3:1. OT Short Term Goal 1 - Progress (Week 2): Met OT Short Term Goal 2 (Week 2): Pt will donning underpants and pants with mod assist sit to stand. OT Short Term Goal 2 - Progress (Week 2): Met OT Short Term Goal 3 (Week 2): Pt will complete LB bathing sit to stand with mod assist sit to stand in the shower. OT Short Term Goal 3 - Progress (Week 2): Met Week 3:  OT Short Term Goal 1 (Week 3): Pt will complete LB bathing min assist sit to stand. OT Short Term Goal 2 (Week 3): Pt will complete toilet transfers with mod assist stand pivot with the RW and AFO in place on the right foot. OT Short Term Goal 3 (Week 3): Pt will donn underpants and pants following hemi technique with min assist sit to stand.  Skilled Therapeutic Interventions/Progress Updates:    Pt received semi-reclined in bed, denies pain and, agreeable to therapy. Session focus on self-care retraining, activity tolerance, transfer retraining in prep for improved ADL/IADL/func mobility performance + decreased  caregiver burden. Requests need to toilet/shower. Came to sitting EOB with min A to progress RLE off bed. Stedy transfer > toilet > TTB with min A to manage RLE extensor tone. Continent void of bladder on toilet, able to manage clothing with light min A for balance in stedy, attempted standing pericare, but pt reports being unable to reach with RUE. Bathed full-body with LH sponge seated on TTB, would require up to min / mod A to reach periarea thoroughly.   Pt donned bra/shirt while seated and performed grooming tasks mod I seated in w/c with slightly increased amount of time.  Donned pants/brief with mod A to thread RLE and for balance in standing to pull up over R hip. In stance at sink, completed oral and hair care with up to light mod A for balance and to block R knee. Utilized RUE dominantly throughout tasks. Donned B teds with mod A to thread over heels, total A to don B shoes and R AFO.  Pt left seated in w/c with safety belt alarm engaged, call bell in reach, and all immediate needs met.    Therapy Documentation Precautions:  Precautions Precautions: Fall Precaution Comments: R hemiparesis, R LE extensor tone Restrictions Weight Bearing Restrictions: No    Pain:   denies ADL: See Care Tool for more details.   Therapy/Group: Individual Therapy  Volanda Napoleon MS, OTR/L  06/20/2021, 6:57 AM

## 2021-06-20 NOTE — Progress Notes (Signed)
Physical Therapy Session Note  Patient Details  Name: Terri Werner MRN: [id number] Date of Birth: Sep 29, 1950  Today's Date: 06/20/2021 PT Individual Time: 1540-1625 PT Individual Time Calculation (min): 45 min   Short Term Goals: Week 3:  PT Short Term Goal 1 (Week 3): Pt will perform sit<>stands using LRAD with min assist consistently PT Short Term Goal 2 (Week 3): Pt will perform bed<>chair transfers using LRAD with min assist consistently PT Short Term Goal 3 (Week 3): Pt will ambulate at least 30ft using LRAD with mod assist of 1 and +2 supervision for safety PT Short Term Goal 4 (Week 3): Pt will initiate stair training  Skilled Therapeutic Interventions/Progress Updates:    Pt received sitting in w/c and agreeable to therapy session. Therapist and pt discussed pt's CLOF requiring skilled physical therapy assistance to participate in gait training and that this therapist does not anticipate pt will be a safe, independent functional household ambulator upon discharge. Discussed recommendation for custom wheelchair evaluation and pt in agreement - scheduled for this Thursday Feb 9th with [facility].  Transported to/from gym in w/c for time management and energy conservation.   Pt already wearing R LE Thuasne Sprystep PLS AFO - therapist donned R LE leg loop to facilitate improved swing phase advancement and LE positioning.  Sit>stand w/c>RW with heavy min assist for balance due to R posterior lean while rising to stand - continues to require assist to position R foot prior to initiating transfer due to extensor tone and limited hamstring activation to bring foot under her BOS.  Gait training 145ft x2 using RW with mod assist of 1 and +2 providing CGA for safety and intermittent (very rare) assist for AD management - 2nd gait trial added 2 additional 90 degree turns on top of 1x 180degree turn - pt continues to demo significant improvement in ability to weight shift L during L stance  phase and pt now able to advance R LE ~50-75% of the way during swing requiring max assist for therapist to avoid R LE scissoring and excessive hip external rotation, cuing to step foot towards R front RW wheel - pt with no instances of significant R lean/LOB. Pt requires heavier assist and multimodal cuing to step back towards wheelchair requiring total assist to step R LE backwards towards seat.   B UE and L LE wheelchair propulsion ~55ft with supervision and cuing for timing of propulsion and navigating through doorways - pt demonstrates potential to reach mod-I functional mobility at wheelchair level.  Transported remainder of distance back to room. Pt left seated in w/c with needs in reach, seat belt alarm on, and nurse present.   Therapy Documentation Precautions:  Precautions Precautions: Fall Precaution Comments: R hemiparesis, R LE extensor tone Restrictions Weight Bearing Restrictions: No   Pain:  Continues to have chronic low back pain but no complaints during session.    Therapy/Group: Individual Therapy  Tawana Scale , PT, DPT, NCS, CSRS 06/20/2021, 3:10 PM

## 2021-06-20 NOTE — Progress Notes (Signed)
Occupational Therapy Session Note  Patient Details  Name: Terri Werner MRN: [id number] Date of Birth: 1951-04-01  Today's Date: 06/20/2021 OT Individual Time: 4098-1191 OT Individual Time Calculation (min): 42 min    Short Term Goals: Week 3:  OT Short Term Goal 1 (Week 3): Pt will complete LB bathing min assist sit to stand. OT Short Term Goal 2 (Week 3): Pt will complete toilet transfers with mod assist stand pivot with the RW and AFO in place on the right foot. OT Short Term Goal 3 (Week 3): Pt will donn underpants and pants following hemi technique with min assist sit to stand.  Skilled Therapeutic Interventions/Progress Updates:    Session 1: (4782-9562)  Pt in wheelchair to start already completing ADL session earlier this am.  No report of pain during session.  Took her down to the therapy gym for work on standing balance for increased independence with toileting and LB dressing tasks.  She was able to complete stand pivot transfer from wheelchair to the therapy mat with max assist stand pivot.  She then completed sit to stand from the therapy mat with min assist and maintained static standing with min assist using mirror for feedback.  Worked on trying to maintain right knee extension in standing while stepping forward with the LLE and then back.  Mod to max assist needed to complete.  Progressed to working on functional mobility forward in intervals of approximately 8-10' with total +2 (pt 40%).  Max assist for advancing the RLE and positioning it for weightbearing as well as mod assist for sustained activation of knee extension while stepping with the LLE.  Increased LOB to the right still noted at times secondary to increased pushing.  Short step length also noted in the LLE as well.  Completed three intervals of mobility with return back to the room at the end of the session with pt left sitting up in the wheelchair at the end of the session.    Session 2: (1308-6578)  Pt in  the process of completing toilet transfer with use of the [place] and NT assisting to start session.  Therapist took over and had her complete toileting with use of the RW for support at mod assist level and then ambulate out to the sink to wash her hands, throw away her paper towel, and transfer to the wheelchair.  Max assist needed for functional mobility throughout the room.  She also needed overall mod to max assist to advance and place the RLE in preparation for weightbearing to step with the LUE.  Increased right knee flexion noted at times with increased LOB to the right.  Continued with work on walking forward from her wheelchair to the sink with the RW and mod assist.  Also had her work on stepping backwards back to the wheelchair at overall max assist.  Completed several repetitions of this with emphasis on left weightshift for advancing the RLE forward and backwards.  Pt still with decreased control when attempting to advance the RLE resulting in adduction of the LE with external rotation.  Finished session with pt sitting in the wheelchair and with the call button and phone in reach.    Therapy Documentation Precautions:  Precautions Precautions: Fall Precaution Comments: R hemiparesis, R LE extensor tone Restrictions Weight Bearing Restrictions: No   Pain: Pain Assessment Pain Scale: Faces Pain Score: 0-No pain ADL: See Care Tool Section for some details of mobility and selfcare   Therapy/Group: Individual Therapy  Javarie Crisp  OTR/L 06/20/2021, 12:43 PM

## 2021-06-21 NOTE — Patient Care Conference (Addendum)
Inpatient RehabilitationTeam Conference and Plan of Care Update Date: 06/21/2021   Time: 10:41 AM    Patient Name: Terri Werner      Medical Record Number: [id number]  Date of Birth: August 28, 1950 Sex: Female         Room/Bed: 5C07C/5C07C-01 Payor Info: Payor: [employer]ICARE / Plan: BCBS MEDICARE / Product Type: *No Product type* /    Admit Date/Time:  05/31/2021 12:36 PM  Primary Diagnosis:  Intraparenchymal hemorrhage of brain [facility])  Hospital Problems: Principal Problem:   Intraparenchymal hemorrhage of brain [facility])    Expected Discharge Date: Expected Discharge Date: 06/27/21  Team Members Present: Physician leading conference: Dr. Sula Soda Social Worker Present: Lavera Guise, BSW Nurse Present: Chana Bode, RN PT Present: Casimiro Needle, PT OT Present: Perrin Maltese, OT SLP Present: Eilene Ghazi, SLP PPS Coordinator present : Fae Pippin, SLP     Current Status/Progress Goal Weekly Team Focus  Bowel/Bladder   Continent of both B/B. LBM 2/7         Swallow/Nutrition/ Hydration             ADL's   Supervision for UB selfcare with mod assist for LB selfcare sit to stand.  Min assist for squat pivot transfers with mod to max for stand pivot as well as mod assist for toileting tasks.  min assist level  selfcare retraining, transfer retrianing, balance retraining, neuromuscular re-education, DME education   Mobility   Uses STEDY for transfer to toilet/bed.  min assist transfers and gait with mod-I wheelchair propulsion  pt education, discharge planning, R LE NMR, dynamic standing balance with midline orientation, dynamic gait training, transfer training, wheelchair management and propulsion, activity tolerance   Communication             Safety/Cognition/ Behavioral Observations            Pain   C/o pain at least once a day. Pain management in place         Skin   No current skin issues noted.           Discharge  Planning:  discharging with assistance from sister, niece and daughter. Sister and niece able to [facility]. 27/7 supervision avaliable   Team Discussion: Patient with ongoing low back pain, treated with TENs/lidocaine patch. AFO for leg pain. Patient's progress limited by pushing, right lean, scissoring gait and pain.  Patient on target to meet rehab goals: yes, currently needs supervision for upper body care and showering . Requires mod assist for lower body care, min assist for squat pivot transfers but mod - max for stand pivot transfers/toileting.  Able to ambulate up to 130' with Mod assist. Goals for discharge set for min assist overall.  *See Care Plan and progress notes for long and short-term goals.   Revisions to Treatment Plan:  Downgraded goals W/C consult; non ambulatory at discharge AFO   Teaching Needs: Safety, transfers, toileting, medication management and secondary risk management, etc.   Current Barriers to Discharge: Decreased caregiver support and Home enviroment access/layout  Possible Resolutions to Barriers: Family education with daughter/sister HH follow up services recommended DME: RW, W/C, DA-BSC, TTB     Medical Summary Current Status: intraparenchymal hemorrhage of brain, hypertension, right sided weakness, tachycardia, low back and right lower extremity pain, chest pain    Barriers to Discharge Comments: intraparenchymal hemorrhage of brain, hypertension, right sided weakness, tachycardia, low back and right lower extremity pain, chest pain Possible Resolutions to [employer]: continue  magnesium and norvasc and monitoring blood pressure three times per day, continue AFO, trial TENS unit for lower back pain, continue lidocaine patch   Continued Need for Acute Rehabilitation Level of Care: The patient requires daily medical management by a physician with specialized training in physical medicine and rehabilitation for the following reasons: Direction  of a multidisciplinary physical rehabilitation program to maximize functional independence : Yes Medical management of patient stability for increased activity during participation in an intensive rehabilitation regime.: Yes Analysis of laboratory values and/or radiology reports with any subsequent need for medication adjustment and/or medical intervention. : Yes   I attest that I was present, lead the team conference, and concur with the assessment and plan of the team.   Chana Bode B 06/21/2021, 3:14 PM

## 2021-06-21 NOTE — Progress Notes (Signed)
PROGRESS NOTE   Subjective/Complaints: Pt reports Lidocaine patches helped some with pain overnight.  Hasn't spoken to therapy yet about TENs unit-  Cannot move well with PRAFO on leg.   Pain still in low back, but a little better this AM   ROS:  Pt denies SOB, abd pain, CP, N/V/C/D, and vision changes   Objective:   No results found. No results for input(s): WBC, HGB, HCT, PLT in the last 72 hours.  No results for input(s): NA, K, CL, CO2, GLUCOSE, BUN, CREATININE, CALCIUM in the last 72 hours.   Intake/Output Summary (Last 24 hours) at 06/21/2021 1849 Last data filed at 06/21/2021 1820 Gross per 24 hour  Intake 1003 ml  Output --  Net 1003 ml        Physical Exam: Vital Signs Blood pressure (!) 141/71, pulse 80, temperature 97.7 F (36.5 C), resp. rate 16, height 5\' 2"  (1.575 m), weight 103.2 kg, SpO2 100 %.   General: awake, alert, appropriate, sitting up eating breakfast; NAD HENT: conjugate gaze; oropharynx moist CV: regular rate; no JVD Pulmonary: CTA B/L; no W/R/R- good air movement GI: soft, NT, ND, (+)BS Psychiatric: appropriate- interactive Neurological: Ox3; wearing PRAFO on RLE Neurologic: Cranial nerves II through XII intact, motor strength is 5/5 in bilateral deltoid, bicep, tricep, grip, 3/5 Right knee ext 0/5 RIght foot and ankle  and 5/5/ Left hip flexor, knee extensors, ankle dorsiflexor and plantar flexor Sensory exam reduced  sensation to light touch RLE but now able to ID light touch on foot! Absent LT sensation RLE and reduce proprioception  Cerebellar exam normal finger to nose to finger as well as heel to shin in bilateral upper and lower extremities Musculoskeletal: Full range of motion in all 4 extremities. No joint swelling Psych: pleasant and cooperative    Assessment/Plan: 1. Functional deficits which require 3+ hours per day of interdisciplinary therapy in a comprehensive  inpatient rehab setting. Physiatrist is providing close team supervision and 24 hour management of active medical problems listed below. Physiatrist and rehab team continue to assess barriers to discharge/monitor patient progress toward functional and medical goals  Care Tool:  Bathing    Body parts bathed by patient: Right arm, Left arm, Chest, Abdomen, Right upper leg, Left upper leg, Face, Right lower leg, Left lower leg, Front perineal area   Body parts bathed by helper: Buttocks     Bathing assist Assist Level: Minimal Assistance - Patient > 75%     Upper Body Dressing/Undressing Upper body dressing   What is the patient wearing?: Bra, Pull over shirt    Upper body assist Assist Level: Independent    Lower Body Dressing/Undressing Lower body dressing      What is the patient wearing?: Pants, Underwear/pull up     Lower body assist Assist for lower body dressing: Moderate Assistance - Patient 50 - 74%     Toileting Toileting    Toileting assist Assist for toileting: Dependent - Patient 0% (stedy)     Transfers Chair/bed transfer  Transfers assist     Chair/bed transfer assist level: Moderate Assistance - Patient 50 - 74% (min/mod A squat pivot)     Locomotion  Ambulation   Ambulation assist   Ambulation activity did not occur: Safety/medical concerns  Assist level: Moderate Assistance - Patient 50 - 74% (only 1 person assist) Assistive device: Other (comment) (RW, R LE AFO, and leg loop/lifter) Max distance: 177ft   Walk 10 feet activity   Assist  Walk 10 feet activity did not occur: Safety/medical concerns        Walk 50 feet activity   Assist Walk 50 feet with 2 turns activity did not occur: Safety/medical concerns         Walk 150 feet activity   Assist Walk 150 feet activity did not occur: Safety/medical concerns         Walk 10 feet on uneven surface  activity   Assist Walk 10 feet on uneven surfaces activity did not  occur: Safety/medical concerns         Wheelchair     Assist Is the patient using a wheelchair?: Yes Type of Wheelchair: Manual    Wheelchair assist level: Supervision/Verbal cueing, Set up assist Max wheelchair distance: 40ft    Wheelchair 50 feet with 2 turns activity    Assist        Assist Level: Dependent - Patient 0%   Wheelchair 150 feet activity     Assist      Assist Level: Dependent - Patient 0%   Blood pressure (!) 141/71, pulse 80, temperature 97.7 F (36.5 C), resp. rate 16, height 5\' 2"  (1.575 m), weight 103.2 kg, SpO2 100 %.    Medical Problem List and Plan: 1. Functional deficits secondary to IPH 05/27/21 in the left parasagittal frontal parietal lobes likely secondary to uncontrolled hypertension RLE> RUE weakness              -patient may shower             -ELOS/Goals: MinA 2/14- some improv3ement with sensation RLE  -constipation't PT and OT/CIR- will see if can use TENS unit for back pain 2.  DVT: discussed recommendation not to anti-coagulate given recent IPH and she is in agreement -DVT/anticoagulation:  Mechanical:  Antiembolism stockings, knee (TED hose) Bilateral lower extremities             -antiplatelet therapy: N/A 3. Lower back pain: kpad added. Tylenol as needed, order prn BenGay   2/8- added voltaren gel QID and Lidoderm patches at night- looking into getting TENS unit during day 4. Mood: Provide emotional support             -antipsychotic agents: N/A 5. Neuropsych: This patient is capable of making decisions on her own behalf. 6. Skin/Wound Care: Routine skin checks 7. Fluids/Electrolytes/Nutrition: Routine in and outs with follow-up chemistries 8.  Hypertension.  Start magnesium gluconate 250mg  HS .Continue amlodipine 5mg  daily.  Vitals:   06/21/21 0437 06/21/21 1427  BP: 103/62 (!) 141/71  Pulse: 70 80  Resp: 18 16  Temp: 97.9 F (36.6 C) 97.7 F (36.5 C)  SpO2: 100% 100%  2/8- BP somewhat labile- low 161W to  960A systolic- con't regimen for now 9.  Gram-negative rod UTI.  Completed Keflex- resolved  10.  GERD.  continue Protonix 11.  Hyperlipidemia.  Rosuvastatin 5 mg daily resume at discharge. 12.  Obesity.  BMI 42.05.  Dietary follow-up 13. Constipation: continue magnesium gluconate 250mg  HS.  14.  Hx of lumbar spinal stenosis and spondylosis, has had Lumbar RF in past (2018) , pt reports improvement with muscle relaxers will trial  15.  Chest pain at  rest (now resolved) some improvement with sitting position, pt denies hx of reflux but is on protonix , EKG 06/06/21 unchanged vs 05/27/21, in fact T waves normalized  Troponin x 2, neg Had relief with Maalox likely GI  16. Right lower extremity pain:   Gabapentin was just increased to TID on 1/27--pain seems better 1/31. Continue with current plan.  17. Right foot drop: continue dynamic night splint qhs,pt states it was uncomfortable "cutting into foot" will ask PT to assess fit ,  using trial AFO ,AFO consult may be on 2/8   LOS: 21 days A FACE TO FACE EVALUATION WAS PERFORMED  Terri Werner 06/21/2021, 6:49 PM

## 2021-06-21 NOTE — Progress Notes (Signed)
Physical Therapy Session Note  Patient Details  Name: Terri Werner MRN: [id number] Date of Birth: 19-Feb-1951  Today's Date: 06/21/2021 PT Individual Time: 0810-0905 PT Individual Time Calculation (min): 55 min   Short Term Goals:  Week 3:  PT Short Term Goal 1 (Week 3): Pt will perform sit<>stands using LRAD with min assist consistently PT Short Term Goal 2 (Week 3): Pt will perform bed<>chair transfers using LRAD with min assist consistently PT Short Term Goal 3 (Week 3): Pt will ambulate at least 23f using LRAD with mod assist of 1 and +2 supervision for safety PT Short Term Goal 4 (Week 3): Pt will initiate stair training  Skilled Therapeutic Interventions/Progress Updates:   Pt received supine in bed and agreeable to PT. Supine>sit transfer with mod assist and cues for awareness of the RLE and improved motor planning to reduce extensor response in the RLE. Sit<>stand x 10 throughout session in stedy with CGA for safety and set up occasionally to improve RLE position. Pt transported to bathroom in stedy. Pt able to void bladder sitting on BSC over toilet. Pt able to perform pericare standing in stedy with min assist for balance. Dressing performed on EOB for upper and lower body. Total A for lower body dressing including brief, pants, and shoes. Set up assist for upper body including shirt and bra.   Pt transported to rehab gym in 5th floor. [facility]  with cues for improved sequencing to prevent lateral LOB to the R. Pt performed partial sqaut 2 x 5 with min-mod assist and max cues for improved awareness of anterior LOB and motor planning to improve hip hinge with initiation of squat. Sit<>stand x 3 with RW to improve hip hinge and use of BUE to control descent into chair. [facility] returned to [facility] and [facility] and all needs met.         Therapy Documentation Precautions:  Precautions Precautions: Fall Precaution  Comments: R hemiparesis, R LE extensor tone Restrictions Weight Bearing Restrictions: No    Pain: Pain Assessment Pain Scale: Faces Pain Score: 0-No pain   Therapy/Group: Individual Therapy  ALorie Phenix2/12/2021, 10:44 AM

## 2021-06-21 NOTE — Progress Notes (Signed)
Occupational Therapy Session Note  Patient Details  Name: Terri Werner MRN: [id number] Date of Birth: February 02, 1951  Today's Date: 06/21/2021 OT Individual Time: 2202-5427 OT Individual Time Calculation (min): 58 min    Short Term Goals: Week 3:  OT Short Term Goal 1 (Week 3): Pt will complete LB bathing min assist sit to stand. OT Short Term Goal 2 (Week 3): Pt will complete toilet transfers with mod assist stand pivot with the RW and AFO in place on the right foot. OT Short Term Goal 3 (Week 3): Pt will donn underpants and pants following hemi technique with min assist sit to stand.  Skilled Therapeutic Interventions/Progress Updates:    Pt in wheelchair to start session with transfer down to the dayroom for session.  Worked on RUE functional use and standing balance while engaged with the Wii activity.  She demonstrated moderate difficulty with coordination of the RUE while attempting to play the game.  Progressed from trying to play in sitting to working on standing as well, dividing attention between balance and RUE coordination use.  Min assist for static standing balance with mod to max demonstrational cueing to keep the right knee extended as she demonstrates increased motor impersistence.  Finished with transfer back to the room via wheelchair where she was left sitting up in the wheelchair with safety alarm belt in place.    Therapy Documentation Precautions:  Precautions Precautions: Fall Precaution Comments: R hemiparesis, R LE extensor tone Restrictions Weight Bearing Restrictions: No   Pain: Pain Assessment Pain Scale: Faces Pain Score: 0-No pain     Therapy/Group: Individual Therapy  Terri Werner OTR/L 06/21/2021, 10:42 AM

## 2021-06-21 NOTE — Progress Notes (Signed)
Occupational Therapy Session Note  Patient Details  Name: Terri Werner MRN: [id number] Date of Birth: 1951/03/11  Today's Date: 06/21/2021 OT Individual Time: 1135-1200 OT Individual Time Calculation (min): 25 min    Short Term Goals: Week 1:  OT Short Term Goal 1 (Week 1): Donn UB clothing items with ModA OT Short Term Goal 1 - Progress (Week 1): Met OT Short Term Goal 2 (Week 1): Patient to complete sit to stand with functional task performance with ModA OT Short Term Goal 2 - Progress (Week 1): Met OT Short Term Goal 3 (Week 1): Patient will complete squat-pivot transfer with MaxAx1 person OT Short Term Goal 3 - Progress (Week 1): Met OT Short Term Goal 4 (Week 1): Pt will attend to RUE in relation to placement during functional task performance OT Short Term Goal 4 - Progress (Week 1): Met Week 2:  OT Short Term Goal 1 (Week 2): Patient will complete stand-pivot transfer with MaxAx1 person to the 3:1. OT Short Term Goal 1 - Progress (Week 2): Met OT Short Term Goal 2 (Week 2): Pt will donning underpants and pants with mod assist sit to stand. OT Short Term Goal 2 - Progress (Week 2): Met OT Short Term Goal 3 (Week 2): Pt will complete LB bathing sit to stand with mod assist sit to stand in the shower. OT Short Term Goal 3 - Progress (Week 2): Met Week 3:  OT Short Term Goal 1 (Week 3): Pt will complete LB bathing min assist sit to stand. OT Short Term Goal 2 (Week 3): Pt will complete toilet transfers with mod assist stand pivot with the RW and AFO in place on the right foot. OT Short Term Goal 3 (Week 3): Pt will donn underpants and pants following hemi technique with min assist sit to stand.  Skilled Therapeutic Interventions/Progress Updates:    Pt received seated in w/c with rec therapist present, no c/o pain, agreeable to therapy. Session focus on self-care retraining, activity tolerance, transfer retraining in prep for improved ADL/IADL/func mobility performance +  decreased caregiver burden. Declined need for ADL, total A w/c transport to and from gym due to time constraints.   Stood x2 at [employer] to target RLE NMR and static sitting balance.  Gloriann Loan Cancellation Task: 3 min, 0 misses, 3 distracters hits  -Rotator Sequence: completed 50% of task before needing to sit down.   -Able to use RUE dominantly throughout with no difficulty, min to mod A for static standing balance and blocking R knee and coming into standing due to posterior lean and poor carryover of split hand-technique.   Pt left seated in w/c with safety belt placed, call bell in reach, and all immediate needs met.    Therapy Documentation Precautions:  Precautions Precautions: Fall Precaution Comments: R hemiparesis, R LE extensor tone Restrictions Weight Bearing Restrictions: No  Pain:  No c/o throughout ADL: See Care Tool for more details.  Therapy/Group: Individual Therapy  Volanda Napoleon MS, OTR/L  06/21/2021, 6:53 AM

## 2021-06-21 NOTE — Progress Notes (Addendum)
Patient ID: Terri Werner, female   DOB: 1950-09-28, 71 y.o.   MRN: [id number]  Team Conference Report to Patient/Family  Team Conference discussion was reviewed with the patient and caregiver, including goals, any changes in plan of care and target discharge date.  Patient and caregiver express understanding and are in agreement.  The patient has a target discharge date of 06/27/21.  Sw met with patient and provided conference updates. Pt AFO consult today and WC eval on tomorrow. Family education scheduled on Saturday 9-12. Patient requesting [facility] [place] 06/21/2021, 1:39 PM

## 2021-06-21 NOTE — Progress Notes (Signed)
Recreational Therapy Session Note  Patient Details  Name: Terri Werner MRN: [id number] Date of Birth: 1950/10/14 Today's Date: 06/21/2021  Pain: no c/o Skilled Therapeutic Interventions/Progress Updates: Session focused on pt education regarding importance of self care, directing care, self advocacy, energy conservation and community reintegration.  Discussion included support system dynamics, importance of maintaining a sense of independence, importance of leisure and it's impact on overall wellness and continued recovery and potential community pursuits once home.  Pt stated understanding of the above.  Therapy/Group: Individual Therapy Elliet Goodnow 06/21/2021, 1:15 PM

## 2021-06-21 NOTE — Progress Notes (Signed)
Physical Therapy Session Note  Patient Details  Name: Terri Werner MRN: [id number] Date of Birth: 18-Apr-1951  Today's Date: 06/21/2021 PT Individual Time: 1305-1402 PT Individual Time Calculation (min): 57 min   Short Term Goals: Week 2:  PT Short Term Goal 1 (Week 2): Pt will perform sit<>stands using LRAD with min assist PT Short Term Goal 1 - Progress (Week 2): Progressing toward goal (still inconsistent requiring intermittent light mod assist) PT Short Term Goal 2 (Week 2): Pt will perform bed<>chair transfers using LRAD with min assist PT Short Term Goal 2 - Progress (Week 2): Progressing toward goal (still inconsistent requiring intermittent light mod assist) PT Short Term Goal 3 (Week 2): Pt will ambulate at least 46ft using LRAD with max assist of 1 and +2 min assist PT Short Term Goal 3 - Progress (Week 2): Met PT Short Term Goal 4 (Week 2): Pt will participate in Berg Balance Test demonstrating improvement in score by at least 7 points indicating decreased fall risk PT Short Term Goal 4 - Progress (Week 2): Met  Skilled Therapeutic Interventions/Progress Updates:    Pt received sitting in w/c and agreeable to therapy session with focus on AFO consultation. Thayer Ohm, [facility] present for AFO consultation. Pt's new tennis shoes arrived. Doffed old shoes and AFO then donned new shoes.    Transported to/from gym in w/c for time management and energy conservation.  Sit>stands w/c>RW with varying light to heavy min assist due to R posterior lean while rising to stand, cuing for increased anterior trunk flexion to initiate transfer - continues to require therapist assist to position R LE correctly prior to initiating transfer, started pt education on how to manage R LE more independently using R hand pulling on AFO strap and using L foot to move R LE. Stands>sit RW>w/c with light min assist for safety and pt demoing improved midline orientation of hips while descending into the chair  (pt previously would hit R hip on R w/c armrest on the way down due to R pelvic shift).   Therapist donned leg lifter/loops on R LE to facilitate improved R LE swing phase advancement and positioning.  Gait training ~77ft using RW without AFO to assess gait mechanics with mod assist from therapist for balance due to slight R lean and to position R LE during swing to avoid scissoring and excessive hip external rotation. Without AFO, pt with more impaired foot clearance during swing and placed at significant risk for inversion rolling injury to her ankle due to scissoring tendency.   Donned Thu[place]ne Sprystep PLS AFO.  Gait training ~62ft using RW with mod assist from therapist as described above - wearing AFO pt demos improved  R LE foot clearance during swing advancement as well as improved ankle stability and safety. Pt discussed AFO options with CPO and therapist - recommend ordering a Thuasne Sprystep PLS AFO for a slightly more rigid brace in event pt's extensor tone increases in future and based on pt's weight for increased ankle stability as well as shoe toe cap to improve pt's ability to more independently advance foot during swing. CPO recommended utilizing the shoe spacers that pt received with her tennis shoes in L shoes to create a functional "leg length discrepancy" to make L LE "longer" than R LE to improve pt's R LE foot clearance during swing.  Gait training additional 18ft + 161ft using RW with mod assist of 1 as described above with pt continuing to require assist to position R LE  during swing to avoid scissoring and excessive hip external rotation - pt able to make L turns and turn to sit in w/c at end of each walk with heavier mod assist during this as well as total assist to step R LE backwards - requires more assist to maintain wider BOS while turning.  At end of session, pt left seated in w/c with needs in reach.   Therapy Documentation Precautions:  Precautions Precautions:  Fall Precaution Comments: R hemiparesis, R LE extensor tone Restrictions Weight Bearing Restrictions: No   Pain: Continues to have chronic low back pain - provided rest breaks, repositioning, and increased activity tolerance for pain management.   Therapy/Group: Individual Therapy  Ginny Forth , PT, DPT, NCS, CSRS  06/21/2021, 12:18 PM

## 2021-06-22 NOTE — Plan of Care (Signed)
°  Problem: Sit to Stand Goal: LTG:  Patient will perform sit to stand with assistance level (PT) Description: LTG:  Patient will perform sit to stand with assistance level (PT) Flowsheets (Taken 06/22/2021 1808) LTG: PT will perform sit to stand in preparation for functional mobility with assistance level: (downgraded based on pt's CLOF and progress) Minimal Assistance - Patient > 75% Note: downgraded based on pt's CLOF and progress   Problem: RH Bed Mobility Goal: LTG Patient will perform bed mobility with assist (PT) Description: LTG: Patient will perform bed mobility with assistance, with/without cues (PT). Flowsheets (Taken 06/22/2021 1808) LTG: Pt will perform bed mobility with assistance level of: (downgraded based on pt's CLOF and progress) Minimal Assistance - Patient > 75% Note: downgraded based on pt's CLOF and progress   Problem: RH Ambulation Goal: LTG Patient will ambulate in controlled environment (PT) Description: LTG: Patient will ambulate in a controlled environment, # of feet with assistance (PT). Flowsheets (Taken 06/22/2021 1808) LTG: Pt will ambulate in controlled environ  assist needed:: (downgraded assistance level based on pt's CLOF and progress) Moderate Assistance - Patient 50 - 74% LTG: Ambulation distance in controlled environment: 110ft using LRAD Note: Downgraded assistance level based on pt's CLOF and progress Goal: LTG Patient will ambulate in home environment (PT) Description: LTG: Patient will ambulate in home environment, # of feet with assistance (PT). Flowsheets (Taken 06/22/2021 1808) LTG: Pt will ambulate in home environ  assist needed:: (downgraded based on pt's CLOF and progress) Moderate Assistance - Patient 50 - 74% LTG: Ambulation distance in home environment: 45ft using LRAD with skilled therapist assistance Note: downgraded based on pt's CLOF and progress

## 2021-06-22 NOTE — Progress Notes (Signed)
PROGRESS NOTE   Subjective/Complaints:  No issues overnite, RLE and CLBP not severe , Orthotist did eval yesterday   ROS:  Pt denies SOB, abd pain, CP, N/V/C/D, and vision changes   Objective:   No results found. No results for input(s): WBC, HGB, HCT, PLT in the last 72 hours.  No results for input(s): NA, K, CL, CO2, GLUCOSE, BUN, CREATININE, CALCIUM in the last 72 hours.   Intake/Output Summary (Last 24 hours) at 06/22/2021 0841 Last data filed at 06/21/2021 1820 Gross per 24 hour  Intake 531 ml  Output --  Net 531 ml         Physical Exam: Vital Signs Blood pressure 140/76, pulse 84, temperature 98.1 F (36.7 C), temperature source Oral, resp. rate 20, height 5\' 2"  (1.575 m), weight 103.2 kg, SpO2 100 %.    General: No acute distress Mood and affect are appropriate Heart: Regular rate and rhythm no rubs murmurs or extra sounds Lungs: Clear to auscultation, breathing unlabored, no rales or wheezes Abdomen: Positive bowel sounds, soft nontender to palpation, nondistended Extremities: No clubbing, cyanosis, or edema Skin: No evidence of breakdown, no evidence of rash   Neurologic: Cranial nerves II through XII intact, motor strength is 5/5 in bilateral deltoid, bicep, tricep, grip, 3/5 Right knee ext 0/5 RIght foot and ankle  and 5/5/ Left hip flexor, knee extensors, ankle dorsiflexor and plantar flexor Sensory exam reduced  sensation to light touch RLE but now able to ID light touch on foot! + LT sensation RLE and reduce proprioception - improved   Musculoskeletal: Full range of motion in all 4 extremities. No joint swelling Psych: pleasant and cooperative    Assessment/Plan: 1. Functional deficits which require 3+ hours per day of interdisciplinary therapy in a comprehensive inpatient rehab setting. Physiatrist is providing close team supervision and 24 hour management of active medical problems listed  below. Physiatrist and rehab team continue to assess barriers to discharge/monitor patient progress toward functional and medical goals  Care Tool:  Bathing    Body parts bathed by patient: Right arm, Left arm, Chest, Abdomen, Right upper leg, Left upper leg, Face, Right lower leg, Left lower leg, Front perineal area   Body parts bathed by helper: Buttocks     Bathing assist Assist Level: Minimal Assistance - Patient > 75%     Upper Body Dressing/Undressing Upper body dressing   What is the patient wearing?: Bra, Pull over shirt    Upper body assist Assist Level: Independent    Lower Body Dressing/Undressing Lower body dressing      What is the patient wearing?: Pants, Underwear/pull up     Lower body assist Assist for lower body dressing: Moderate Assistance - Patient 50 - 74%     Toileting Toileting    Toileting assist Assist for toileting: Dependent - Patient 0% (stedy)     Transfers Chair/bed transfer  Transfers assist     Chair/bed transfer assist level: Moderate Assistance - Patient 50 - 74% (min/mod A squat pivot)     Locomotion Ambulation   Ambulation assist   Ambulation activity did not occur: Safety/medical concerns  Assist level: Moderate Assistance - Patient 50 -  74% (only 1 person assist) Assistive device: Other (comment) (RW, R LE AFO, and leg loop/lifter) Max distance: 168ft   Walk 10 feet activity   Assist  Walk 10 feet activity did not occur: Safety/medical concerns        Walk 50 feet activity   Assist Walk 50 feet with 2 turns activity did not occur: Safety/medical concerns         Walk 150 feet activity   Assist Walk 150 feet activity did not occur: Safety/medical concerns         Walk 10 feet on uneven surface  activity   Assist Walk 10 feet on uneven surfaces activity did not occur: Safety/medical concerns         Wheelchair     Assist Is the patient using a wheelchair?: Yes Type of Wheelchair:  Manual    Wheelchair assist level: Supervision/Verbal cueing, Set up assist Max wheelchair distance: 6ft    Wheelchair 50 feet with 2 turns activity    Assist        Assist Level: Dependent - Patient 0%   Wheelchair 150 feet activity     Assist      Assist Level: Dependent - Patient 0%   Blood pressure 140/76, pulse 84, temperature 98.1 F (36.7 C), temperature source Oral, resp. rate 20, height 5\' 2"  (1.575 m), weight 103.2 kg, SpO2 100 %.    Medical Problem List and Plan: 1. Functional deficits secondary to IPH 05/27/21 in the left parasagittal frontal parietal lobes likely secondary to uncontrolled hypertension RLE> RUE weakness              -patient may shower             -ELOS/Goals: MinA 2/14- some improvement with sensation RLE   2.  DVT: discussed recommendation not to anti-coagulate given recent IPH and she is in agreement -DVT/anticoagulation:  Mechanical:  Antiembolism stockings, knee (TED hose) Bilateral lower extremities             -antiplatelet therapy: N/A 3. Lower back pain: kpad added. Tylenol as needed, order prn Ben[facility]ay   [facility], had been to pain management , no long duration relief with lumbar RF 4. Mood: Provide emotional support             -antipsychotic agents: N/A 5. Neuropsych: This patient is capable of making decisions on her own behalf. 6. Skin/Wound Care: Routine skin checks 7. Fluids/Electrolytes/Nutrition: Routine in and outs with follow-up chemistries 8.  Hypertension.  Start magnesium gluconate 250mg  HS .Continue amlodipine 5mg  daily.  Vitals:   06/21/21 1427 06/21/21 2130  BP: (!) 141/71 140/76  Pulse: 80 84  Resp: 16 20  Temp: 97.7 F (36.5 C) 98.1 F (36.7 C)  SpO2: 100% 100%  2/9 controlled  9.  Gram-negative rod UTI.  Completed Keflex- resolved  10.  GERD.  continue Protonix 11.  Hyperlipidemia.  Rosuvastatin 5 mg daily resume at discharge. 12.  Obesity.  BMI 42.05.  Dietary follow-up 13. Constipation: continue  magnesium gluconate 250mg  HS.  14.  Hx of lumbar spinal stenosis and spondylosis, has had Lumbar RF in past (2018) , pt reports improvement with muscle relaxers will trial  15.  Chest pain at rest (now resolved) some improvement with sitting position, pt denies hx of reflux but is on protonix , EKG 06/06/21 unchanged vs 05/27/21, in fact T waves normalized  Troponin x 2, neg Had relief with Maalox likely GI  16. Right lower extremity pain:  Gabapentin was just increased to TID on 1/27--pain seems better 1/31. Continue with current plan.  17. Right foot drop: continue dynamic night splint qhs,pt states it was uncomfortable "cutting into foot" will ask PT to assess fit ,  using trial AFO ,AFO consult completed, await fabrication and delivery    LOS: 22 days A FACE TO FACE EVALUATION WAS PERFORMED  Charlett Blake 06/22/2021, 8:41 AM

## 2021-06-22 NOTE — Progress Notes (Signed)
Occupational Therapy Weekly Progress Note  Patient Details  Name: Terri Werner MRN: [id number] Date of Birth: [birth year]  Beginning of progress report period: June 17, 2021 End of progress report period: June 22, 2021  Today's Date: 06/22/2021 OT Individual Time: 1062-6948 OT Individual Time Calculation (min): 45 min    Patient has met 1 of 3 short term goals.  Terri Werner is making steady progress with OT at this time.  She continues to complete UB selfcare with supervision and then LB selfcare with mod assist for sit to stand.  She is able to complete sit to stand with min assist as well as squat pivot transfers to the wheelchair, drop arm commode, and tub bench.  When standing, she continues to demonstrate left knee motor impersistence as she demonstrates decreased ability to maintain it into extension with standing.  Slight pusher tendencies to the right noted when standing and having to let go with at least one UE.  Mod assist needed to maintain balance with this.  Transfers stand pivot are completed with the RW and the right AFO with mod assist stand pivot.  RUE functional use is at a diminished level with some decreased awareness of its position at times.  Feel overall she is making steady progress with OT at this time and is on target for most of her current goals.  Will downgrade LB dressing goals secondary to needing greater assist than originally expected.  Recommend continued CIR level OT to progress toward goals and continued ongoing pt family education with anticipated discharge on 2/14.    Patient continues to demonstrate the following deficits: muscle weakness, muscle joint tightness, and muscle paralysis, impaired timing and sequencing, unbalanced muscle activation, decreased coordination, and decreased motor planning, decreased memory, and decreased standing balance, decreased postural control, hemiplegia, and decreased balance strategies and therefore will  continue to benefit from skilled OT intervention to enhance overall performance with BADL and Reduce care partner burden.  Patient progressing toward long term goals..  Continue plan of care.  OT Short Term Goals Week 4:  OT Short Term Goal 1 (Week 4): Pt will continue working toward established LTGs set at min to mod assist overall.  Skilled Therapeutic Interventions/Progress Updates:    Session 1: [id number]) Pt worked on bathing and dressing during session.  She was able to complete stand pivot transfer to the left with mod assist on the tub bench where she was able to remove clothing in sitting with lateral leans and min guard assist.  Bathing was completed with min guard as well in sitting with lateral lean for washing her buttocks.  Once complete, she was able to perform stand pivot transfer to the wheelchair with mod assist and then worked on dressing.  Mod assist for donning brief and pull up pants following hemi dressing techniques.  She was able to complete donning her bra and pullover shirt with supervision.  Total assist was needed for TEDs as well as right AFO and shoe.  She was able to donn the left shoe with setup but needed mod assist for tying it.  Finished session with pt sitting in the wheelchair and with the call button and phone in reach and safety alarm belt in place.    Session 2: ([id number])  Pt in wheelchair to start with transport down to the dayroom for session.  She completed stand pivot transfer to the therapy mat with use of the RW and mod to max assist.  Right AFO was in  place as well.  Worked on sit to stand transitions, standing balance, and RUE coordination while engaged in [employer] activity.  She was able to complete sit to stand transitions and maintain standing balance while reaching for bean bags with min assist.  Bean bags were placed down in front of her as well as to the left incorporating bilateral knee flexion with weightshifting to the left.  Once standing, she  was able to toss the bean bag with the RUE underhand with some occasional LOB noted posteriorly and to the right after tossing it.  Progressed to functional mobility with use of the RW as well.  Mod assist for taking steps forward to another chair and also to the wheelchair.  She needed mod assist at times for placement of the RLE with mod demonstrational cueing to take small steps as when attempting a normal step the RLE inverts and adducts.  Finished session with transfer back to the room where she was left sitting up in the wheelchair with the call button and phone in reach and safety belt in place.    Therapy Documentation Precautions:  Precautions Precautions: Fall Precaution Comments: R hemiparesis, R LE extensor tone Restrictions Weight Bearing Restrictions: No   Pain: Pain Assessment Pain Scale: Faces Pain Score: 0-No pain ADL: See Care Tool Section for some details of mobility and selfcare  Therapy/Group: Individual Therapy  Franchot Pollitt OTR/L 06/22/2021, 12:13 PM

## 2021-06-22 NOTE — Progress Notes (Signed)
Patient ID: Terri Werner, female   DOB: 12/21/1950, 71 y.o.   MRN: [id number]  Patient [facility] referral sent to [facility]

## 2021-06-22 NOTE — Progress Notes (Signed)
Patient ID: Terri Werner, female   DOB: [birth year], 71 y.o.   MRN: [id number]  DME ordered through [employer]: RW, TTB, Drop Arm BSC

## 2021-06-22 NOTE — Progress Notes (Signed)
Patient ID: Terri Werner, female   DOB: [birth year], 71 y.o.   MRN: [id number]  Transportation resources provided to patient and daughter

## 2021-06-22 NOTE — Plan of Care (Signed)
°  Problem: RH Dressing Goal: LTG Patient will perform lower body dressing w/assist (OT) Description: LTG: Patient will perform lower body dressing with assist, with/without cues in positioning using equipment (OT) Flowsheets (Taken 06/22/2021 1243) LTG: Pt will perform lower body dressing with assistance level of: (goal downgraded based on progress) Moderate Assistance - Patient 50 - 74% Note: goal downgraded based on progress   Problem: RH Toileting Goal: LTG Patient will perform toileting task (3/3 steps) with assistance level (OT) Description: LTG: Patient will perform toileting task (3/3 steps) with assistance level (OT)  Flowsheets (Taken 06/22/2021 1243) LTG: Pt will perform toileting task (3/3 steps) with assistance level: (goal downgraded based on progress) Moderate Assistance - Patient 50 - 74% Note: goal downgraded based on progress

## 2021-06-22 NOTE — Progress Notes (Signed)
Physical Therapy Weekly Progress Note  Patient Details  Name: Terri Werner MRN: [id number] Date of Birth: 12/14/1950  Beginning of progress report period: June 15, 2021 End of progress report period: June 22, 2021  Today's Date: 06/22/2021 PT Individual Time: 4098-1191 PT Individual Time Calculation (min): 70 min   Patient has met 3 of 4 short term goals. Ms. Serven continues to make steady progress with therapy demonstrating increasing independence with functional mobility. She is performing supine<>sit with mod assist, sit<>stands using RW with min assist (after having assistance to correctly position R LE), and bed<>chair squat pivot transfers with min assist though requiring mod/max assist for stand pivot transfers. She is participating in gait training ambulating up to 134ft using RW with skilled mod assistance primarily to manage R LE steps and positioning. She demonstrates improving midline orientation with decreased R lean/pushing. Despite her significant progress, patient will not be a safe household ambulator with family assistance at D/C. Therefore, a custom wheelchair consultation occurred to obtain patient an ultra-lightweight manual wheelchair to allow mod-I functional mobility. The patients greatest impairments continue to be R hemiparesis (LE>UE) requiring assistance to step and position R LE during all transfers and gait training as well as impaired spatial awareness and motor planning to be aware of when lean/LOB is occurring and implement an appropriate balance recovery strategy to prevent a fall. Pt will benefit from continued CIR level therapist to further progress her independence prior to D/Cing home with 24hr support.   Patient continues to demonstrate the following deficits muscle weakness, muscle joint tightness, and muscle paralysis, decreased cardiorespiratoy endurance, impaired timing and sequencing, abnormal tone, unbalanced muscle activation,  decreased coordination, and decreased motor planning, decreased attention to right and decreased motor planning, and decreased sitting balance, decreased standing balance, decreased postural control, hemiplegia, and decreased balance strategies and therefore will continue to benefit from skilled PT intervention to increase functional independence with mobility.  Patient not progressing toward long term goals.  See goal revision.  Continue plan of care.  PT Short Term Goals Week 3:  PT Short Term Goal 1 (Week 3): Pt will perform sit<>stands using LRAD with min assist consistently PT Short Term Goal 1 - Progress (Week 3): Met PT Short Term Goal 2 (Week 3): Pt will perform bed<>chair transfers using LRAD with min assist consistently PT Short Term Goal 2 - Progress (Week 3): Met PT Short Term Goal 3 (Week 3): Pt will ambulate at least 65ft using LRAD with mod assist of 1 and +2 supervision for safety PT Short Term Goal 3 - Progress (Week 3): Met PT Short Term Goal 4 (Week 3): Pt will initiate stair training PT Short Term Goal 4 - Progress (Week 3): Progressing toward goal Week 4:  PT Short Term Goal 1 (Week 4): = to LTGs based on ELOS  Skilled Therapeutic Interventions/Progress Updates:  Ambulation/gait training;Community reintegration;DME/adaptive equipment instruction;Neuromuscular re-education;Psychosocial support;Stair training;UE/LE Strength taining/ROM;Wheelchair propulsion/positioning;Balance/vestibular training;Discharge planning;Functional electrical stimulation;Pain management;Skin care/wound management;Therapeutic Activities;UE/LE Coordination activities;Cognitive remediation/compensation;Disease management/prevention;Functional mobility training;Patient/family education;Splinting/orthotics;Therapeutic Exercise;Visual/perceptual remediation/compensation    Pt received sitting with wheelchair with Apolinar Junes, ATP from [employer] present for wheelchair consultation. Pt agreeable to therapy  session.   Discussed the following wheelchair recommendations to reach mod-I functional mobility at a wheelchair level:  - K5 ultra-lightweight manual wheelchair to allow adjustable axle to improve access to wheels and promote optimal biomechanical alignment for efficient propulsion and prevention of repetitive stress injuries (RSI) as well as enable adjustment of front/rear seat-to-floor heights for proper position/center of  gravity to accommodate for client height and size - need for tension adjustable back support to accommodate for patient's size as well as provide lumbar support due to chronic back pain  - recommendation for pressure relieving foam cushion (latex free)  - need for flip back armrests to allow squat pivot transfers  Pt in agreement with the above wheelchair recommendations. Pt with no additional questions/concerns regarding wheelchair at this time.   Transported to/from gym in w/c for time management and energy conservation.  Sit>stands w/c>RW with light min assist for balance due to minor R posterior lean - continues to require assistance to position R LE correctly prior to initiating coming to stand (educated pt on importance of ensuring her family member assists her with this every time she goes to stand).  Pt wearing her personal R LE Thuasne Sprystep AFO, pt's shoe has been returned with the toe cap on it, and pt reports CPO placed additional shoe spacers in L shoe to provide a functional "leg length discrepancy" to improve R LE foot clearance during swing.   Therapist donned leg loop/lifter on R LE to allow improved facilitation of swing phase advancement.  Gait training 52ft + 145ft using RW with skilled mod assistance of 1 primarily for R LE management to avoid scissoring and excessive hip external rotation - pt continues to demo consistent ability to weight shift L onto L stance without manual facilitation. Pt able to manage 2 90 degree turns (varied R vs L) during each  gait trial again with therapist primarily facilitating R LE placement during swing to maintain wider BOS to ensure balance stability. Pt with some difficulty managing AD but difficult to determine if this was due to therapist's positioning or poor AD management with R UE. Pt demos improved ability to advance R LE during swing while wearing her AFO and having the toe cap.   At end of session, pt left seated in w/c with needs in reach and friend present.   Therapy Documentation Precautions:  Precautions Precautions: Fall Precaution Comments: R hemiparesis, R LE extensor tone Restrictions Weight Bearing Restrictions: No   Pain: Continues to have chronic low back pain as well as some R knee pain - no intervention needed during session with pt highly motivated to participate.   Therapy/Group: Individual Therapy  Ginny Forth , PT, DPT, NCS, CSRS  06/22/2021, 2:15 PM

## 2021-06-23 NOTE — Progress Notes (Signed)
Patient ID: Terri Werner, female   DOB: Feb 06, 1951, 71 y.o.   MRN: [id number]  Patient approved by [facility] for follow up. Orders sent to agency.

## 2021-06-23 NOTE — Progress Notes (Signed)
Occupational Therapy Session Note  Patient Details  Name: TERIANNE THAKER MRN: [id number] Date of Birth: [birth year]  Today's Date: 06/23/2021 OT Individual Time: 0601-5615 OT Individual Time Calculation (min): 24 min    Short Term Goals: Week 4:  OT Short Term Goal 1 (Week 4): Pt will continue working toward established LTGs set at min to mod assist overall.  Skilled Therapeutic Interventions/Progress Updates:    Session focused on BUE coordination, strengthening, and core stability. Pt in w/c upon arrival and she was able to propel w/c with min cueing  to the therapy gym. Min A squat pivot transfer to the mat. From the EOM she completed UE strengthening/coordination circuit closed chain with a 4lb dowel, 2x10 repetitions. She reached overhead, forward, and a bicep curl with min cueing for technique. She then completed backward lean with exaggerated forward trunk flexion to promote core engagement 2x 10 repetitions. She returned to her w/c and was brought back to her room. She was left sitting up awaiting PT arrival.   Therapy Documentation Precautions:  Precautions Precautions: Fall Precaution Comments: R hemiparesis, R LE extensor tone Restrictions Weight Bearing Restrictions: No  Therapy/Group: Individual Therapy  PRENTISS HAMMETT 06/23/2021, 6:40 AM

## 2021-06-23 NOTE — Progress Notes (Signed)
PROGRESS NOTE   Subjective/Complaints:  Received AFO for RLE, feels like it is fitting well   ROS:  Pt denies SOB, abd pain, CP, N/V/C/D, and vision changes   Objective:   No results found. No results for input(s): WBC, HGB, HCT, PLT in the last 72 hours.  No results for input(s): NA, K, CL, CO2, GLUCOSE, BUN, CREATININE, CALCIUM in the last 72 hours.   Intake/Output Summary (Last 24 hours) at 06/23/2021 0834 Last data filed at 06/22/2021 1808 Gross per 24 hour  Intake 538 ml  Output --  Net 538 ml         Physical Exam: Vital Signs Blood pressure 103/65, pulse 72, temperature 97.8 F (36.6 C), temperature source Oral, resp. rate 18, height 5\' 2"  (1.575 m), weight 103.2 kg, SpO2 98 %.    General: No acute distress Mood and affect are appropriate Heart: Regular rate and rhythm no rubs murmurs or extra sounds Lungs: Clear to auscultation, breathing unlabored, no rales or wheezes Abdomen: Positive bowel sounds, soft nontender to palpation, nondistended Extremities: No clubbing, cyanosis, or edema Skin: No evidence of breakdown, no evidence of rash   Neurologic: Cranial nerves II through XII intact, motor strength is 5/5 in bilateral deltoid, bicep, tricep, grip, 3/5 Right knee ext 0/5 RIght foot and ankle  and 5/5/ Left hip flexor, knee extensors, ankle dorsiflexor and plantar flexor Sensory exam reduced  sensation to light touch RLE but now able to ID light touch on foot! + LT sensation RLE and reduce proprioception - improved   Musculoskeletal: Full range of motion in all 4 extremities. No joint swelling Psych: pleasant and cooperative    Assessment/Plan: 1. Functional deficits which require 3+ hours per day of interdisciplinary therapy in a comprehensive inpatient rehab setting. Physiatrist is providing close team supervision and 24 hour management of active medical problems listed below. Physiatrist and  rehab team continue to assess barriers to discharge/monitor patient progress toward functional and medical goals  Care Tool:  Bathing    Body parts bathed by patient: Right arm, Left arm, Chest, Abdomen, Right upper leg, Left upper leg, Face, Right lower leg, Left lower leg, Front perineal area, Buttocks   Body parts bathed by helper: Buttocks     Bathing assist Assist Level: Contact Guard/Touching assist (sitting with lateral leans)     Upper Body Dressing/Undressing Upper body dressing   What is the patient wearing?: Bra, Pull over shirt    Upper body assist Assist Level: Set up assist    Lower Body Dressing/Undressing Lower body dressing      What is the patient wearing?: Pants, Incontinence brief     Lower body assist Assist for lower body dressing: Moderate Assistance - Patient 50 - 74%     Toileting Toileting    Toileting assist Assist for toileting: Dependent - Patient 0% (stedy)     Transfers Chair/bed transfer  Transfers assist     Chair/bed transfer assist level: Moderate Assistance - Patient 50 - 74% (min/mod A squat pivot)     Locomotion Ambulation   Ambulation assist   Ambulation activity did not occur: Safety/medical concerns  Assist level: Moderate Assistance - Patient 50 -  74% Assistive device: Walker-rolling (R LE AFO & leg lifter) Max distance: 123ft   Walk 10 feet activity   Assist  Walk 10 feet activity did not occur: Safety/medical concerns        Walk 50 feet activity   Assist Walk 50 feet with 2 turns activity did not occur: Safety/medical concerns         Walk 150 feet activity   Assist Walk 150 feet activity did not occur: Safety/medical concerns         Walk 10 feet on uneven surface  activity   Assist Walk 10 feet on uneven surfaces activity did not occur: Safety/medical concerns         Wheelchair     Assist Is the patient using a wheelchair?: Yes Type of Wheelchair: Manual    Wheelchair  assist level: Supervision/Verbal cueing, Set up assist Max wheelchair distance: 21ft    Wheelchair 50 feet with 2 turns activity    Assist        Assist Level: Dependent - Patient 0%   Wheelchair 150 feet activity     Assist      Assist Level: Dependent - Patient 0%   Blood pressure 103/65, pulse 72, temperature 97.8 F (36.6 C), temperature source Oral, resp. rate 18, height 5\' 2"  (1.575 m), weight 103.2 kg, SpO2 98 %.    Medical Problem List and Plan: 1. Functional deficits secondary to IPH 05/27/21 in the left parasagittal frontal parietal lobes likely secondary to uncontrolled hypertension RLE> RUE weakness              -patient may shower             -ELOS/Goals: MinA 2/14- some improvement with sensation RLE   2.  DVT: discussed recommendation not to anti-coagulate given recent IPH and she is in agreement -DVT/anticoagulation:  Mechanical:  Antiembolism stockings, knee (TED hose) Bilateral lower extremities             -antiplatelet therapy: N/A 3. Lower back pain: kpad added. Tylenol as needed, order prn BenGay   CHronic, had been to pain management , no long duration relief with lumbar RF 4. Mood: Provide emotional support             -antipsychotic agents: N/A 5. Neuropsych: This patient is capable of making decisions on her own behalf. 6. Skin/Wound Care: Routine skin checks 7. Fluids/Electrolytes/Nutrition: Routine in and outs with follow-up chemistries 8.  Hypertension.  Start magnesium gluconate 250mg  HS .Continue amlodipine 5mg  daily.  Vitals:   06/22/21 1955 06/23/21 0601  BP: (!) 147/83 103/65  Pulse: 76 72  Resp: 17 18  Temp: 98.4 F (36.9 C) 97.8 F (36.6 C)  SpO2: 100% 98%  2/10 controlled  9.  Gram-negative rod UTI.  Completed Keflex- resolved  10.  GERD.  continue Protonix 11.  Hyperlipidemia.  Rosuvastatin 5 mg daily resume at discharge. 12.  Obesity.  BMI 42.05.  Dietary follow-up 13. Constipation: continue magnesium gluconate 250mg   HS.  14.  Hx of lumbar spinal stenosis and spondylosis, has had Lumbar RF in past (2018) , pt reports improvement with muscle relaxers will trial  15.  Chest pain at rest (now resolved) some improvement with sitting position, pt denies hx of reflux but is on protonix , EKG 06/06/21 unchanged vs 05/27/21, in fact T waves normalized  Troponin x 2, neg Had relief with Maalox likely GI  16. Right lower extremity pain:   Gabapentin was just increased to TID  on 1/27--pain seems better 1/31. Continue with current plan.  17. Right foot drop: continue dynamic night splint qhs,pt states it was uncomfortable "cutting into foot" will ask PT to assess fit ,  has permanenet AFO pt states fit is good without pressure areas    LOS: 23 days A FACE TO FACE EVALUATION WAS PERFORMED  Charlett Blake 06/23/2021, 8:34 AM

## 2021-06-23 NOTE — Progress Notes (Signed)
Occupational Therapy Session Note  Patient Details  Name: Terri Werner MRN: [id number] Date of Birth: 04-Nov-1950  Today's Date: 06/23/2021 OT Individual Time: 9447-3958 OT Individual Time Calculation (min): 70 min    Short Term Goals: Week 3:  OT Short Term Goal 1 (Week 3): Pt will complete LB bathing min assist sit to stand. OT Short Term Goal 1 - Progress (Week 3): Not met OT Short Term Goal 2 (Week 3): Pt will complete toilet transfers with mod assist stand pivot with the RW and AFO in place on the right foot. OT Short Term Goal 2 - Progress (Week 3): Met OT Short Term Goal 3 (Week 3): Pt will donn underpants and pants following hemi technique with min assist sit to stand. OT Short Term Goal 3 - Progress (Week 3): Not met Week 4:  OT Short Term Goal 1 (Week 4): Pt will continue working toward established LTGs set at min to mod assist overall.  Skilled Therapeutic Interventions/Progress Updates:  Patient met lying supine in bed in agreement with OT treatment session. 5/10 pain reported at rest and with activity in low back (chronic). RN made aware of patient desire to receive lidocaine patch for back prior to dressing. Patient able to come to EOB with light assist at RLE. Sit to stand from EOB to stedy with assist again at RLE to prevent hip/knee extension. Total A for transfer to [facility] in prep for toileitng. 3/3 parts of toileting task with assist for hygiene/clothing management. Patient then completed shower transfer to tub bench via stedy. Sit to stand in walk-in shower to adjust tub bench with assist at RLE as mentioned above. Patient completed UB bathing/dressing with set-up assist and LB bathing/dressing with Mod to Max A. Grooming tasks in perched position at sink surface with set-up assist. Session concluded with patient seated in wc with call bell within reach, belt alarm activated and all needs met.    Therapy Documentation Precautions:  Precautions Precautions:  Fall Precaution Comments: R hemiparesis, R LE extensor tone Restrictions Weight Bearing Restrictions: No General:   Therapy/Group: Individual Therapy  Caedmon Louque R Howerton-Davis 06/23/2021, 6:58 AM

## 2021-06-23 NOTE — Progress Notes (Signed)
Recreational Therapy Discharge Summary °Patient Details  °Name: Terri Werner °MRN: [id number] °Date of Birth: 03/07/1951 °Today's Date: 06/23/2021 ° °Long term goals set: 1 ° °Long term goals met: 1 ° °Comments on progress toward goals: Pt has made good progress during LOS and is set for discharge home with family on 2/14.  TR sessions focused on pt education including leisure education, activity analysis/modifications and stress management at supervision/min cueing level.  Also discussed the importance of social, emotional, spiritual health in addition to physical health and their effects on overall health and wellness.  Pt stated understanding ° ° °Reasons for discharge: discharge from hospital ° °Follow-up: Home Health ° °Patient/family agrees with progress made and goals achieved: Yes ° °Francely Craw °06/23/2021, 12:20 PM ° ° °

## 2021-06-23 NOTE — Progress Notes (Signed)
Physical Therapy Session Note  Patient Details  Name: Terri Werner MRN: [id number] Date of Birth: 04/12/1951  Today's Date: 06/23/2021 PT Individual Time: 1005-1100; 1300-1408 PT Individual Time Calculation (min): 55 min and 68 mins  Short Term Goals:  Week 4:  PT Short Term Goal 1 (Week 4): = to LTGs based on ELOS  Skilled Therapeutic Interventions/Progress Updates:    Session 1: Patient received in bathroom with NT, agreeable to PT. She denies pain. Stedy transfer back to wc for time management. PT transporting patient in wc to therapy gym for time management and energy conservation. She ambulated 30ft with RW, R AFO + leg lifter and MinA/ModA. Patient with compensatory use of R adductors to advance R LE resulting in ER foot. Intermittent R LE adduction/scissoring. Patient with poor proprioception in R LE and due to strong ER of foot, often running RW into her foot, needing to look down to see where foot was placed and how to adjust accordingly. Patient with uncontrolled sit into wc due to strong R LE adduction resulting in LOB. Patient completing seated R LE hip strengthening of IR/ER. Patient with blocked practice stepping R LE to target with emphasis on maintaining neutral IR/ER. Patient still with intermittent adduction/scissoring and ER. Added 4# ankle weight to assist in proprioceptive input with slight improvement. Patient returning to room in wc, seatbelt alarm on, call light within reach.    Session 2: Patient received sitting up in wc, agreeable to PT. She denies pain. PT transporting patient in wc to therapy gym for time management and energy conservation. Applied 4# ankle weight to R LE for continued blocked practice and stepping to target with emphasis on foot alignment. She remains with compensatory use of hip adductors and poor engagement of hip Irs resulting in ER foot. PT removed weight and patient with slightly improved positioning of LE to target. NuStep completed  x10 mins with BLE only only level 4 with emphasis on R knee alignment. Intermittently able to engage hip IR. ATP present with loaner wc and patient able to trial that. She propelled herself 148ft using B UE and L LE with supervision. Patient returning to room in wc, seatbelt alarm on, call light within reach.   Therapy Documentation Precautions:  Precautions Precautions: Fall Precaution Comments: R hemiparesis, R LE extensor tone Restrictions Weight Bearing Restrictions: No     Therapy/Group: Individual Therapy  Karoline Caldwell, PT, DPT, CBIS  06/23/2021, 7:32 AM

## 2021-06-24 NOTE — Progress Notes (Signed)
Physical Therapy Session Note  Patient Details  Name: Terri Werner MRN: [id number] Date of Birth: 01-Jan-1951  Today's Date: 06/24/2021 PT Individual Time: 1110-1215 PT Individual Time Calculation (min): 65 min   Short Term Goals: Week 4:  PT Short Term Goal 1 (Week 4): = to LTGs based on ELOS  Skilled Therapeutic Interventions/Progress Updates:    Pt received sitting in w/c with her sister, Terri Werner, friend, Terri Werner, and grandson, Terri Werner, present for family education/training. Pt/family report no current questions/concerns. Plan for therapy session to practice real-life car transfer. Pt reports her current loaner wheelchair has too tall of a floor-to-seat height for her to perform propulsion using L LE - therapist notifying team to adjust this prior to D/C. Sit>stand w/c>RW with min assist for balance - continues to require assist to position R LE correctly prior to initiating the transfer - standing with light min assist donned jacket. Therapist educating pt's family on how to safely assist patient during this but no hands-on from family at this time for time management.  Notified nurse, then transported pt to/from main entrance of hospital in w/c to perform real-life car transfer.  Performed stand pivot transfers between w/c and passenger seat of pt's [employer] SUV using the following technique with primary assistance for R LE stepping - light mod assist overall: - rolled window down to allow pt B UE support on door for stability  - educated family on stabilizing the door with their hip Entering the vehicle: pt turns fully requiring assist to step back with R LE until she can place her hips back onto the seat then rotates L LE into the car while sliding hips back on seat and requires total assist to bring R LE into the vehicle due to extensor tone Exiting the vehicle: pt rotates to place both feet outside of the vehicle requiring total assist to bring R LE out of the vehicle and assist to  reposition it prior to coming to stand, again pt holds onto door with both hands while stepping sideways towards R to pivot hips towards wheelchair requiring mod assist for stepping R LE correctly  Pt performed the car transfer 2x with therapist and then 2x with Terri Werner's assistance as she will be the primary caregiver assisting the pt in/out vehicle and she demonstrated excellent understanding and hands-on assistance. Then 1x with pt's grandson requiring more cuing but demonstrating improved understanding throughout.  Family reports no questions/concerns regarding car transfer and report feeling confident to perform this at D/C.  Therapist educated family on how to break down and fold up the wheelchair to place in/out vehicle and Terri Werner demonstrated understanding.  Transported pt back to room in w/c, nurse made aware. Discussed with pt and grandson the plan for pt's daughter, Terri Werner, to come on Monday for hands-on family education/training from 8:00AM-11:00AM - therapist wrote this on pt's schedule. At end of session, pt left seated in w/c with needs in reach and grandson present.  Therapy Documentation Precautions:  Precautions Precautions: Fall Precaution Comments: R hemiparesis, R LE extensor tone Restrictions Weight Bearing Restrictions: No   Pain: No reports of pain throughout session.  Therapy/Group: Individual Therapy  Terri Werner , PT, DPT, NCS, CSRS  06/24/2021, 8:17 AM

## 2021-06-24 NOTE — Discharge Summary (Addendum)
Physical Therapy Discharge Summary  Patient Details  Name: Terri Werner MRN: [id number] Date of Birth: March 02, 1951  Patient has met 6 of 10 long term goals due to improved activity tolerance, improved balance, improved postural control, increased strength, ability to compensate for deficits, functional use of  right upper extremity and right lower extremity, improved attention, improved awareness, and improved coordination.  Patient to discharge at a wheelchair level requiring Min Assist for squat pivot transfers bed<>chair vs sit<>stand transfers using RW.   Patient's care partner attended hands-on education/training and is independent to provide the necessary physical assistance at discharge.  Reasons goals not met: Pt requires CGA for dynamic sitting balance to maintain safety. Pt requires mod assist for car transfers to maintain balance and manage R LE in/out vehicle. Pt still requires supervision for wheelchair mobility at this time, but anticipate will be mod-I in near future based on progress thus far and intact cognition.  Recommendation:  Patient will benefit from ongoing skilled PT services in home health setting to continue to advance safe functional mobility, address ongoing impairments in dynamic standing balance, transfer training, R LE NMR, gait training, and minimize fall risk.  Equipment: R LE AFO, RW, and custom wheelchair through [employer]  Reasons for discharge: treatment goals met and discharge from hospital  Patient/family agrees with progress made and goals achieved: Yes  PT Discharge Precautions/Restrictions Precautions Precautions: Fall Precaution Comments: R hemiparesis, R LE extensor tone Restrictions Weight Bearing Restrictions: No  Pain Interference Pain Interference Pain Effect on Sleep: 2. Occasionally Pain Interference with Therapy Activities: 2. Occasionally Pain Interference with Day-to-Day Activities: 1. Rarely or not at  all Vision/Perception  Vision - History Ability to See in Adequate Light: 0 Adequate Vision - Assessment Eye Alignment: Within Functional Limits Perception Perception: Impaired Inattention/Neglect: Does not attend to right side of body Spatial Orientation: impaired spatial awareness with impaired midline orientation Praxis Praxis: Impaired Praxis Impairment Details: Motor planning  Cognition Overall Cognitive Status: Within Functional Limits for tasks assessed Arousal/Alertness: Awake/alert Orientation Level: Oriented X4 Year: 2023 Month: February Day of Week: Correct Attention: Focused;Sustained;Selective Focused Attention: Appears intact Sustained Attention: Appears intact Selective Attention: Appears intact Memory: Appears intact Awareness: Appears intact Problem Solving: Appears intact Safety/Judgment: Appears intact Sensation Sensation Light Touch: Impaired Detail Light Touch Impaired Details: Impaired RUE;Impaired RLE Hot/Cold: Not tested Proprioception: Impaired Detail Proprioception Impaired Details: Impaired RUE;Impaired RLE Stereognosis: Not tested Coordination Gross Motor Movements are Fluid and Coordinated: No Coordination and Movement Description: gross motor movements improved since eval but continue to be impaired due to R hemiparesis, R LE extensor tone, and impaired trunk control Motor  Motor Motor: Abnormal tone;Hemiplegia;Abnormal postural alignment and control Motor - Discharge Observations: R hemiparesis and R LE extensor tone  Mobility  Bed Mobility Bed Mobility: Supine to Sit;Sit to Supine Supine to Sit: Minimal Assistance - Patient > 75% Sit to Supine: Minimal Assistance - Patient > 75% Transfers Transfers: Sit to Stand;Stand to Sit;Stand Pivot Transfers;Squat Pivot Transfers Sit to Stand: Minimal Assistance - Patient > 75% Stand to Sit: Minimal Assistance - Patient > 75% Stand Pivot Transfers: Moderate Assistance - Patient 50 - 74% Stand  Pivot Transfer Details: Manual facilitation for weight shifting;Manual facilitation for placement;Tactile cues for sequencing;Tactile cues for weight shifting;Verbal cues for sequencing;Verbal cues for gait pattern;Verbal cues for technique;Verbal cues for safe use of DME/AE;Verbal cues for precautions/safety Squat Pivot Transfers: Minimal Assistance - Patient > 75% Transfer (Assistive device): Rolling walker Locomotion  Gait Ambulation: Yes Gait Assistance: Moderate Assistance -  Patient 50-74% Gait Distance (Feet): 170 Feet Assistive device: Rolling walker;Other (Comment) (R LE [place] [employer] PLS AFO and leg lifter/loop) Gait Assistance Details: Manual facilitation for weight bearing;Manual facilitation for weight shifting;Manual facilitation for placement;Verbal cues for gait pattern;Verbal cues for technique;Verbal cues for sequencing;Tactile cues for placement;Tactile cues for weight shifting;Tactile cues for sequencing;Tactile cues for posture;Tactile cues for initiation;Tactile cues for weight beaing;Verbal cues for safe use of DME/AE Gait Assistance Details: requires mod assist to advance R LE during swing phase using leg lifter/loops Gait Gait: Yes Gait Pattern: Impaired Gait Pattern: Decreased step length - right;Decreased step length - left;Decreased stance time - right;Decreased stride length;Decreased hip/knee flexion - right;Poor foot clearance - right;Decreased dorsiflexion - right;Decreased weight shift to left;Step-through pattern;Scissoring;Lateral trunk lean to right Gait velocity: decreased Stairs / Additional Locomotion Stairs: No Corporate treasurer: Yes Wheelchair Assistance: Set up Education officer, museum: Both upper extremities;Left lower extremity Wheelchair Parts Management: Needs assistance (needs assist for leg rests, independent with brakes) Distance: 178ft  Trunk/Postural Assessment  Cervical Assessment Cervical Assessment: Exceptions  to [facility] (slight forward head) Thoracic Assessment Thoracic Assessment: Exceptions to [facility] (thoracic rounding in sitting) Lumbar Assessment Lumbar Assessment:  (slight posterior pelvic tilt with hx of chronic low back pain) Postural Control Postural Control: Deficits on evaluation Balance  Balance Balance Assessed: Yes Standardized Balance Assessment Standardized Balance Assessment: Berg Balance Test Berg Balance Test Sit to Stand: Able to stand using hands after several tries Standing Unsupported: Able to stand 2 minutes with supervision Sitting with Back Unsupported but Feet Supported on Floor or Stool: Able to sit safely and securely 2 minutes Stand to Sit: Controls descent by using hands (R pelvic weight shift) Transfers: Needs one person to assist Standing Unsupported with Eyes Closed: Able to stand 10 seconds with supervision Standing Ubsupported with Feet Together: Needs help to attain position and unable to hold for 15 seconds From Standing, Reach Forward with Outstretched Arm: Can reach forward >12 cm safely (5") From Standing Position, Pick up Object from Floor: Unable to pick up and needs supervision (Able to pick up, but sat to chair/couldn't stand from low squat) From Standing Position, Turn to Look Behind Over each Shoulder: Turn sideways only but maintains balance Turn 360 Degrees: Needs assistance while turning Standing Unsupported, Alternately Place Feet on Step/Stool: Needs assistance to keep from falling or unable to try Standing Unsupported, One Foot in Front: Able to take small step independently and hold 30 seconds Standing on One Leg: Unable to try or needs assist to prevent fall Total Score: 24 Static Sitting Balance Static Sitting - Balance Support: Feet supported Static Sitting - Level of Assistance: 7: Independent Dynamic Sitting Balance Dynamic Sitting - Balance Support: Feet supported Dynamic Sitting - Level of Assistance: Other (comment) (CGA) Static  Standing Balance Static Standing - Balance Support: During functional activity;Bilateral upper extremity supported Static Standing - Level of Assistance: 4: Min assist Dynamic Standing Balance Dynamic Standing - Balance Support: During functional activity;Left upper extremity supported Dynamic Standing - Level of Assistance: 3: Mod assist Extremity Assessment  RLE Assessment RLE Assessment: Exceptions to [facility] Passive Range of Motion (PROM) Comments: WFL with tightness noted in gastroc/soleus (pt has dynamic night splint for at night and AFO during day) RLE Strength Right Hip Flexion: 2/5 Right Knee Flexion: 2-/5 Right Knee Extension: 3+/5 Right Ankle Dorsiflexion: 0/5 Right Ankle Plantar Flexion: 1/5 RLE Tone RLE Tone: Moderate;Hypertonic RLE Tone Comments: R LE extensor tone LLE Assessment LLE Assessment: Within Functional Limits Active Range  of Motion (AROM) Comments: WFL LLE Strength Left Hip Flexion: 4+/5 Left Knee Flexion: 4+/5 Left Knee Extension: 4+/5 Left Ankle Dorsiflexion: 4+/5 Left Ankle Plantar Flexion: 4+/5    Ginny Forth , PT, DPT, NCS, CSRS  06/26/2021, 2:58 PM

## 2021-06-24 NOTE — Progress Notes (Signed)
Occupational Therapy Session Note  Patient Details  Name: Terri Werner MRN: [id number] Date of Birth: [birth year]  Today's Date: 06/24/2021 OT Individual Time: 4098-1191 OT Individual Time Calculation (min): 90 min    Short Term Goals: Week 1:  OT Short Term Goal 1 (Week 1): Donn UB clothing items with ModA OT Short Term Goal 1 - Progress (Week 1): Met OT Short Term Goal 2 (Week 1): Patient to complete sit to stand with functional task performance with ModA OT Short Term Goal 2 - Progress (Week 1): Met OT Short Term Goal 3 (Week 1): Patient will complete squat-pivot transfer with MaxAx1 person OT Short Term Goal 3 - Progress (Week 1): Met OT Short Term Goal 4 (Week 1): Pt will attend to RUE in relation to placement during functional task performance OT Short Term Goal 4 - Progress (Week 1): Met Week 2:  OT Short Term Goal 1 (Week 2): Patient will complete stand-pivot transfer with MaxAx1 person to the 3:1. OT Short Term Goal 1 - Progress (Week 2): Met OT Short Term Goal 2 (Week 2): Pt will donning underpants and pants with mod assist sit to stand. OT Short Term Goal 2 - Progress (Week 2): Met OT Short Term Goal 3 (Week 2): Pt will complete LB bathing sit to stand with mod assist sit to stand in the shower. OT Short Term Goal 3 - Progress (Week 2): Met Week 3:  OT Short Term Goal 1 (Week 3): Pt will complete LB bathing min assist sit to stand. OT Short Term Goal 1 - Progress (Week 3): Not met OT Short Term Goal 2 (Week 3): Pt will complete toilet transfers with mod assist stand pivot with the RW and AFO in place on the right foot. OT Short Term Goal 2 - Progress (Week 3): Met OT Short Term Goal 3 (Week 3): Pt will donn underpants and pants following hemi technique with min assist sit to stand. OT Short Term Goal 3 - Progress (Week 3): Not met Week 4:  OT Short Term Goal 1 (Week 4): Pt will continue working toward established LTGs set at min to mod assist overall.  Skilled  Therapeutic Interventions/Progress Updates:    Pt seen for ADL training with family education with 3 of her family members/friends that will be her main caregivers at home.  Family education very successful as all 3 were very involved and did hands on practice.   The first 45 min were spent giving all 3 the opportunity for demonstration and redemonstration of squat pivot transfers from w/c to bed and sit to stands from EOB.  Pt was also asked to give each of them cues on how to help her to facilitate her movements.  They all did extremely well.  Reviewed her RLE  weakness and incoordination and precautions to take with movement.  Pt wanted to shower. She used RW to ambulate to shower with min-mod A and cues for safe spacing of R foot.  Pt then transferred onto bench to shower. Her main caregiver present (Terri Werner) to see how pt was doing with her B/d.  Pt able to bathe with CGA and dressing with min A for underwear and pants.  Pt using RUE well to not need A with UB.  Pt resting in wc at end of session with all needs met and family present.   Therapy Documentation Precautions:  Precautions Precautions: Fall Precaution Comments: R hemiparesis, R LE extensor tone Restrictions Weight Bearing Restrictions: No  Vital Signs:   Pain: no c/o pain Pain Assessment Pain Score: 0-No pain ADL: ADL Eating: Set up Grooming: Setup Where Assessed-Grooming: Chair Upper Body Bathing: Setup Where Assessed-Upper Body Bathing: Shower Lower Body Bathing: Contact guard Where Assessed-Lower Body Bathing: Shower Upper Body Dressing: Setup Where Assessed-Upper Body Dressing: Wheelchair Lower Body Dressing: Moderate assistance Where Assessed-Lower Body Dressing: Wheelchair Toileting: Moderate assistance Where Assessed-Toileting: Teacher, adult education: Curator Method: Ambulance person: Engineer, technical sales: Dependent Web designer Method:  Engineer, technical sales: Grab bars, Insurance underwriter: Insurance underwriter Method: Administrator: Emergency planning/management officer, Grab bars   Therapy/Group: Individual Therapy  Terri Werner 06/24/2021, 12:56 PM

## 2021-06-25 MED ORDER — SORBITOL 70 % SOLN: 30.0000 mL | Freq: Once | Status: DC

## 2021-06-25 NOTE — Progress Notes (Signed)
PROGRESS NOTE   Subjective/Complaints:  Pt reports family training done yesterday- still has some more tomorrow.    LBM Thursday- stomach rumbling. But if doesn't go, willing ot take sorbitol.   Doing OK- cannot have TENS unit per pt-/per Dr Raliegh Ip. Would interfere with stroke rehab.    ROS:   Pt denies SOB, abd pain, CP, N/V/C/D, and vision changes   Objective:   No results found. No results for input(s): WBC, HGB, HCT, PLT in the last 72 hours.  No results for input(s): NA, K, CL, CO2, GLUCOSE, BUN, CREATININE, CALCIUM in the last 72 hours.   Intake/Output Summary (Last 24 hours) at 06/25/2021 1710 Last data filed at 06/25/2021 1320 Gross per 24 hour  Intake 600 ml  Output --  Net 600 ml        Physical Exam: Vital Signs Blood pressure (!) 155/79, pulse 78, temperature 98.2 F (36.8 C), resp. rate 20, height 5\' 2"  (1.575 m), weight 103.2 kg, SpO2 100 %.     General: awake, alert, appropriate, sitting up in w/c in room; NAD HENT: conjugate gaze; oropharynx moist CV: regular rate; no JVD Pulmonary: CTA B/L; no W/R/R- good air movement GI: soft, NT, ND, (+)BS Psychiatric: appropriate; bright affect Neurological: alert- Neurologic: Cranial nerves II through XII intact, motor strength is 5/5 in bilateral deltoid, bicep, tricep, grip, 3/5 Right knee ext 0/5 RIght foot and ankle  and 5/5/ Left hip flexor, knee extensors, ankle dorsiflexor and plantar flexor Sensory exam reduced  sensation to light touch RLE but now able to ID light touch on foot! + LT sensation RLE and reduce proprioception - improved   Musculoskeletal: Full range of motion in all 4 extremities. No joint swelling Psych: pleasant and cooperative    Assessment/Plan: 1. Functional deficits which require 3+ hours per day of interdisciplinary therapy in a comprehensive inpatient rehab setting. Physiatrist is providing close team supervision and 24  hour management of active medical problems listed below. Physiatrist and rehab team continue to assess barriers to discharge/monitor patient progress toward functional and medical goals  Care Tool:  Bathing    Body parts bathed by patient: Right arm, Left arm, Chest, Abdomen, Right upper leg, Left upper leg, Face, Right lower leg, Left lower leg, Front perineal area, Buttocks   Body parts bathed by helper: Buttocks     Bathing assist Assist Level: Contact Guard/Touching assist     Upper Body Dressing/Undressing Upper body dressing   What is the patient wearing?: Bra, Pull over shirt    Upper body assist Assist Level: Set up assist    Lower Body Dressing/Undressing Lower body dressing      What is the patient wearing?: Pants, Incontinence brief     Lower body assist Assist for lower body dressing: Minimal Assistance - Patient > 75%     Toileting Toileting    Toileting assist Assist for toileting: Dependent - Patient 0% (stedy)     Transfers Chair/bed transfer  Transfers assist     Chair/bed transfer assist level: Moderate Assistance - Patient 50 - 74% (min/mod A squat pivot)     Locomotion Ambulation   Ambulation assist   Ambulation activity did  not occur: Safety/medical concerns  Assist level: Moderate Assistance - Patient 50 - 74% Assistive device: Walker-rolling (R LE AFO & leg lifter) Max distance: 146ft   Walk 10 feet activity   Assist  Walk 10 feet activity did not occur: Safety/medical concerns        Walk 50 feet activity   Assist Walk 50 feet with 2 turns activity did not occur: Safety/medical concerns         Walk 150 feet activity   Assist Walk 150 feet activity did not occur: Safety/medical concerns         Walk 10 feet on uneven surface  activity   Assist Walk 10 feet on uneven surfaces activity did not occur: Safety/medical concerns         Wheelchair     Assist Is the patient using a wheelchair?:  Yes Type of Wheelchair: Manual    Wheelchair assist level: Supervision/Verbal cueing, Set up assist Max wheelchair distance: 70ft    Wheelchair 50 feet with 2 turns activity    Assist        Assist Level: Dependent - Patient 0%   Wheelchair 150 feet activity     Assist      Assist Level: Dependent - Patient 0%   Blood pressure (!) 155/79, pulse 78, temperature 98.2 F (36.8 C), resp. rate 20, height 5\' 2"  (1.575 m), weight 103.2 kg, SpO2 100 %.    Medical Problem List and Plan: 1. 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  DVT: discussed recommendation not to anti-coagulate given recent IPH and she is in agreement -DVT/anticoagulation:  Mechanical:  Antiembolism stockings, knee (TED hose) Bilateral lower extremities             -antiplatelet therapy: N/A 3. Lower back pain: kpad added. Tylenol as needed, order prn BenGay   [facility], had been to pain management , no long duration relief with lumbar RF  2/12- con't lidoderm patches- has been a little helpful 4. Mood: Provide emotional support             -antipsychotic agents: N/A 5. Neuropsych: This patient is capable of making decisions on her own behalf. 6. Skin/Wound Care: Routine skin checks 7. Fluids/Electrolytes/Nutrition: Routine in and outs with follow-up chemistries 8.  Hypertension.  Start magnesium gluconate 250mg  HS .Continue amlodipine 5mg  daily.  Vitals:   06/25/21 0507 06/25/21 1342  BP: (!) 142/85 (!) 155/79  Pulse: 73 78  Resp: 20 20  Temp: 98.4 F (36.9 C) 98.2 F (36.8 C)  SpO2: 100% 100%  2/10 controlled   2/12- BP a little elevated in last 24 hours, but had been controlled- wait to increase meds- monitor trends.  9.  Gram-negative rod UTI.   Completed Keflex- resolved  10.  GERD.  continue Protonix 11.  Hyperlipidemia.  Rosuvastatin 5 mg daily resume at discharge. 12.  Obesity.  BMI 42.05.  Dietary follow-up 13. Constipation: continue magnesium gluconate 250mg  HS.  14.  Hx of lumbar spinal stenosis and spondylosis, has had Lumbar RF in past (2018) , pt reports improvement with muscle relaxers will trial  15.  Chest pain at rest (now resolved) some improvement  with sitting position, pt denies hx of reflux but is on protonix , EKG 06/06/21 unchanged vs 05/27/21, in fact T waves normalized  Troponin x 2, neg Had relief with Maalox likely GI  16. Right lower extremity pain:   Gabapentin was just increased to TID on 1/27--pain seems better 1/31. Continue with current plan.  17. Right foot drop: continue dynamic night splint qhs,pt states it was uncomfortable "cutting into foot" will ask PT to assess fit ,  has permanent AFO pt states fit is good without pressure areas    LOS: 25 days A FACE TO FACE EVALUATION WAS PERFORMED  Shlomo Seres 06/25/2021, 5:10 PM

## 2021-06-25 NOTE — Discharge Summary (Signed)
Physician Discharge Summary  Patient ID: Terri Werner MRN: [id number] DOB/AGE: [birth year] 71 y.o.  Admit date: 05/31/2021 Discharge date: 06/27/2020  Discharge Diagnoses:  Principal Problem:   Intraparenchymal hemorrhage of brain ([place]) Left posterior tibial DVT/left peroneal Hypertension Gram-negative rod UTI GERD Hyperlipidemia Obesity History of lumbar spinal stenosis with spondylosis/right foot drop History of hepatitis B  Discharged Condition: Stable  Significant Diagnostic Studies: MR BRAIN W WO CONTRAST  Result Date: 05/29/2021 CLINICAL DATA:  Neuro deficit with stroke suspected EXAM: MRI HEAD WITHOUT AND WITH CONTRAST TECHNIQUE: Multiplanar, multiecho pulse sequences of the brain and surrounding structures were obtained without and with intravenous contrast. CONTRAST:  11mL GADAVIST GADOBUTROL 1 MMOL/ML IV SOLN COMPARISON:  Head CT from yesterday FINDINGS: Brain: Acute parenchymal hemorrhage in the parasagittal posterior left frontal lobe as previously seen. No suspected enlargement. Methemoglobin has formed with T1 shortening. There is an expected rim of edema. No abnormal adjacent enhancement. Innumerable chronic microhemorrhages in the peripheral brain. Brain volume is normal. Chronic small vessel ischemic type change in the hemispheric white matter. Partially empty sella, nonspecific in isolation. Vascular: Normal flow voids and vascular enhancements Skull and upper cervical spine: Normal marrow signal Sinuses/Orbits: Negative IMPRESSION: No mass or infarct seen underlying the left posterior frontal hematoma. There are however signs of innumerable remote lobar microhemorrhages and white matter gliosis, compatible with amyloid angiopathy or other primary vascular disease. Electronically Signed   By: Jorje Guild M.D.   On: 05/29/2021 10:37   EEG adult  Result Date: 05/28/2021 Tsosie Billing, MD     05/28/2021  7:59 PM [facility]  [facility] Services Routine EEG Report Duration: 21 min Patient Name:   Terri Werner Date of Birth:   [birth year] Identification Number:   MRN - [id number] Date of Study:   05/28/2021 08:11:10 Indication: Spells, Eval for Seizures, Technical Summary: A routine 20 channel electroencephalogram using the international 10-20 system of electrode placement was performed. Background: 9-10 Hz, Posterior dominant rhythm that attenuated with eye Opening States      Awake Activation Procedures Hyperventilation: Performed : Photic Stimulation: Classification: Normal : There were no Epileptiform discharges Diagnosis: Normal Awake study. There are no epileptiform discharges. Clinical Correlation: This is a normal study. The absence of interictal epileptiform abnormalities does not exclude the diagnosis of a seizure disorder. Dr Tsosie Billing [facility] [phone] Case [id number]  ECHOCARDIOGRAM COMPLETE  Result Date: 05/28/2021    ECHOCARDIOGRAM REPORT   Patient Name:   Terri Werner Date of Exam: 05/28/2021 Medical Rec #:  [id number]                   Height:       62.0 in Accession #:    [id number]                  Weight:       229.9 lb Date of Birth:  1950/06/17                   BSA:          2.028 m Patient Age:    41 years                    BP:           116/61 mmHg Patient Gender: F                           HR:  Area:     3.14 cm  RIGHT VENTRICLE             IVC RV S prime:     17.10 cm/s  IVC diam: 2.00 cm TAPSE (M-mode): 2.2 cm LEFT ATRIUM             Index LA diam:        4.30 cm 2.12 cm/m LA Vol (A2C):   44.3 ml 21.84 ml/m LA Vol (A4C):   42.9 ml 21.15 ml/m LA Biplane Vol: 45.5 ml 22.43 ml/m  AORTIC VALVE AV Area (Vmax):    2.24 cm AV Area (Vmean):   2.35 cm AV Area (VTI):     2.23 cm AV Vmax:           153.00 cm/s AV Vmean:          103.000 cm/s AV VTI:            0.279 m AV Peak Grad:      9.4 mmHg AV Mean Grad:      5.0 mmHg LVOT Vmax:         109.00 cm/s LVOT Vmean:        77.200 cm/s LVOT VTI:          0.198 m LVOT/AV VTI ratio: 0.71  AORTA Ao Root diam: 3.00 cm Ao Asc diam:  3.00 cm MITRAL VALVE               TRICUSPID VALVE MV Area (PHT): 2.37 cm    TR Peak grad:   14.4 mmHg MV Decel Time: 320 msec    TR Vmax:        190.00 cm/s MV E velocity: 57.80 cm/s MV A velocity: 91.30 cm/s  SHUNTS MV E/A  ratio:  0.63        Systemic VTI:  0.20 m                            Systemic Diam: 2.00 cm Gwyndolyn Kaufman MD Electronically signed by Gwyndolyn Kaufman MD Signature Date/Time: 05/28/2021/3:11:17 PM    Final    VAS [place] LOWER EXTREMITY VENOUS (DVT)  Result Date: 06/07/2021  Lower Venous DVT Study Patient Name:  Terri Werner  Date of Exam:   06/07/2021 Medical Rec #: [id number]                  Accession #:    [id number] Date of Birth: 1950-08-31                  Patient Gender: F Patient Age:   44 years Exam Location:  [facility] Procedure:      VAS [place] LOWER EXTREMITY VENOUS (DVT) Referring Phys: Alysia Penna --------------------------------------------------------------------------------  Indications: Right lower extremity pain and swelling.  Comparison Study: No prior studies. Performing Technologist: Darlin Coco RDMS, RVT  Examination Guidelines: A complete evaluation includes B-mode imaging, spectral Doppler, color Doppler, and power Doppler as needed of all accessible portions of each vessel. Bilateral testing is considered an integral part of a complete examination. Limited examinations for reoccurring indications may be performed as noted. The reflux portion of the exam is performed with the patient in reverse Trendelenburg.  +---------+---------------+---------+-----------+----------+--------------+  RIGHT     Compressibility Phasicity Spontaneity Properties Thrombus Aging  +---------+---------------+---------+-----------+----------+--------------+  CFV       Full            Yes  Area:     3.14 cm  RIGHT VENTRICLE             IVC RV S prime:     17.10 cm/s  IVC diam: 2.00 cm TAPSE (M-mode): 2.2 cm LEFT ATRIUM             Index LA diam:        4.30 cm 2.12 cm/m LA Vol (A2C):   44.3 ml 21.84 ml/m LA Vol (A4C):   42.9 ml 21.15 ml/m LA Biplane Vol: 45.5 ml 22.43 ml/m  AORTIC VALVE AV Area (Vmax):    2.24 cm AV Area (Vmean):   2.35 cm AV Area (VTI):     2.23 cm AV Vmax:           153.00 cm/s AV Vmean:          103.000 cm/s AV VTI:            0.279 m AV Peak Grad:      9.4 mmHg AV Mean Grad:      5.0 mmHg LVOT Vmax:         109.00 cm/s LVOT Vmean:        77.200 cm/s LVOT VTI:          0.198 m LVOT/AV VTI ratio: 0.71  AORTA Ao Root diam: 3.00 cm Ao Asc diam:  3.00 cm MITRAL VALVE               TRICUSPID VALVE MV Area (PHT): 2.37 cm    TR Peak grad:   14.4 mmHg MV Decel Time: 320 msec    TR Vmax:        190.00 cm/s MV E velocity: 57.80 cm/s MV A velocity: 91.30 cm/s  SHUNTS MV E/A  ratio:  0.63        Systemic VTI:  0.20 m                            Systemic Diam: 2.00 cm Gwyndolyn Kaufman MD Electronically signed by Gwyndolyn Kaufman MD Signature Date/Time: 05/28/2021/3:11:17 PM    Final    VAS [place] LOWER EXTREMITY VENOUS (DVT)  Result Date: 06/07/2021  Lower Venous DVT Study Patient Name:  Terri Werner  Date of Exam:   06/07/2021 Medical Rec #: [id number]                  Accession #:    [id number] Date of Birth: 1950-08-31                  Patient Gender: F Patient Age:   44 years Exam Location:  [facility] Procedure:      VAS [place] LOWER EXTREMITY VENOUS (DVT) Referring Phys: Alysia Penna --------------------------------------------------------------------------------  Indications: Right lower extremity pain and swelling.  Comparison Study: No prior studies. Performing Technologist: Darlin Coco RDMS, RVT  Examination Guidelines: A complete evaluation includes B-mode imaging, spectral Doppler, color Doppler, and power Doppler as needed of all accessible portions of each vessel. Bilateral testing is considered an integral part of a complete examination. Limited examinations for reoccurring indications may be performed as noted. The reflux portion of the exam is performed with the patient in reverse Trendelenburg.  +---------+---------------+---------+-----------+----------+--------------+  RIGHT     Compressibility Phasicity Spontaneity Properties Thrombus Aging  +---------+---------------+---------+-----------+----------+--------------+  CFV       Full            Yes  Area:     3.14 cm  RIGHT VENTRICLE             IVC RV S prime:     17.10 cm/s  IVC diam: 2.00 cm TAPSE (M-mode): 2.2 cm LEFT ATRIUM             Index LA diam:        4.30 cm 2.12 cm/m LA Vol (A2C):   44.3 ml 21.84 ml/m LA Vol (A4C):   42.9 ml 21.15 ml/m LA Biplane Vol: 45.5 ml 22.43 ml/m  AORTIC VALVE AV Area (Vmax):    2.24 cm AV Area (Vmean):   2.35 cm AV Area (VTI):     2.23 cm AV Vmax:           153.00 cm/s AV Vmean:          103.000 cm/s AV VTI:            0.279 m AV Peak Grad:      9.4 mmHg AV Mean Grad:      5.0 mmHg LVOT Vmax:         109.00 cm/s LVOT Vmean:        77.200 cm/s LVOT VTI:          0.198 m LVOT/AV VTI ratio: 0.71  AORTA Ao Root diam: 3.00 cm Ao Asc diam:  3.00 cm MITRAL VALVE               TRICUSPID VALVE MV Area (PHT): 2.37 cm    TR Peak grad:   14.4 mmHg MV Decel Time: 320 msec    TR Vmax:        190.00 cm/s MV E velocity: 57.80 cm/s MV A velocity: 91.30 cm/s  SHUNTS MV E/A  ratio:  0.63        Systemic VTI:  0.20 m                            Systemic Diam: 2.00 cm Gwyndolyn Kaufman MD Electronically signed by Gwyndolyn Kaufman MD Signature Date/Time: 05/28/2021/3:11:17 PM    Final    VAS [place] LOWER EXTREMITY VENOUS (DVT)  Result Date: 06/07/2021  Lower Venous DVT Study Patient Name:  Terri Werner  Date of Exam:   06/07/2021 Medical Rec #: [id number]                  Accession #:    [id number] Date of Birth: 1950-08-31                  Patient Gender: F Patient Age:   44 years Exam Location:  [facility] Procedure:      VAS [place] LOWER EXTREMITY VENOUS (DVT) Referring Phys: Alysia Penna --------------------------------------------------------------------------------  Indications: Right lower extremity pain and swelling.  Comparison Study: No prior studies. Performing Technologist: Darlin Coco RDMS, RVT  Examination Guidelines: A complete evaluation includes B-mode imaging, spectral Doppler, color Doppler, and power Doppler as needed of all accessible portions of each vessel. Bilateral testing is considered an integral part of a complete examination. Limited examinations for reoccurring indications may be performed as noted. The reflux portion of the exam is performed with the patient in reverse Trendelenburg.  +---------+---------------+---------+-----------+----------+--------------+  RIGHT     Compressibility Phasicity Spontaneity Properties Thrombus Aging  +---------+---------------+---------+-----------+----------+--------------+  CFV       Full            Yes  Area:     3.14 cm  RIGHT VENTRICLE             IVC RV S prime:     17.10 cm/s  IVC diam: 2.00 cm TAPSE (M-mode): 2.2 cm LEFT ATRIUM             Index LA diam:        4.30 cm 2.12 cm/m LA Vol (A2C):   44.3 ml 21.84 ml/m LA Vol (A4C):   42.9 ml 21.15 ml/m LA Biplane Vol: 45.5 ml 22.43 ml/m  AORTIC VALVE AV Area (Vmax):    2.24 cm AV Area (Vmean):   2.35 cm AV Area (VTI):     2.23 cm AV Vmax:           153.00 cm/s AV Vmean:          103.000 cm/s AV VTI:            0.279 m AV Peak Grad:      9.4 mmHg AV Mean Grad:      5.0 mmHg LVOT Vmax:         109.00 cm/s LVOT Vmean:        77.200 cm/s LVOT VTI:          0.198 m LVOT/AV VTI ratio: 0.71  AORTA Ao Root diam: 3.00 cm Ao Asc diam:  3.00 cm MITRAL VALVE               TRICUSPID VALVE MV Area (PHT): 2.37 cm    TR Peak grad:   14.4 mmHg MV Decel Time: 320 msec    TR Vmax:        190.00 cm/s MV E velocity: 57.80 cm/s MV A velocity: 91.30 cm/s  SHUNTS MV E/A  ratio:  0.63        Systemic VTI:  0.20 m                            Systemic Diam: 2.00 cm Gwyndolyn Kaufman MD Electronically signed by Gwyndolyn Kaufman MD Signature Date/Time: 05/28/2021/3:11:17 PM    Final    VAS [place] LOWER EXTREMITY VENOUS (DVT)  Result Date: 06/07/2021  Lower Venous DVT Study Patient Name:  Terri Werner  Date of Exam:   06/07/2021 Medical Rec #: [id number]                  Accession #:    [id number] Date of Birth: 1950-08-31                  Patient Gender: F Patient Age:   44 years Exam Location:  [facility] Procedure:      VAS [place] LOWER EXTREMITY VENOUS (DVT) Referring Phys: Alysia Penna --------------------------------------------------------------------------------  Indications: Right lower extremity pain and swelling.  Comparison Study: No prior studies. Performing Technologist: Darlin Coco RDMS, RVT  Examination Guidelines: A complete evaluation includes B-mode imaging, spectral Doppler, color Doppler, and power Doppler as needed of all accessible portions of each vessel. Bilateral testing is considered an integral part of a complete examination. Limited examinations for reoccurring indications may be performed as noted. The reflux portion of the exam is performed with the patient in reverse Trendelenburg.  +---------+---------------+---------+-----------+----------+--------------+  RIGHT     Compressibility Phasicity Spontaneity Properties Thrombus Aging  +---------+---------------+---------+-----------+----------+--------------+  CFV       Full            Yes  Physician Discharge Summary  Patient ID: Terri Werner MRN: [id number] DOB/AGE: [birth year] 71 y.o.  Admit date: 05/31/2021 Discharge date: 06/27/2020  Discharge Diagnoses:  Principal Problem:   Intraparenchymal hemorrhage of brain ([place]) Left posterior tibial DVT/left peroneal Hypertension Gram-negative rod UTI GERD Hyperlipidemia Obesity History of lumbar spinal stenosis with spondylosis/right foot drop History of hepatitis B  Discharged Condition: Stable  Significant Diagnostic Studies: MR BRAIN W WO CONTRAST  Result Date: 05/29/2021 CLINICAL DATA:  Neuro deficit with stroke suspected EXAM: MRI HEAD WITHOUT AND WITH CONTRAST TECHNIQUE: Multiplanar, multiecho pulse sequences of the brain and surrounding structures were obtained without and with intravenous contrast. CONTRAST:  11mL GADAVIST GADOBUTROL 1 MMOL/ML IV SOLN COMPARISON:  Head CT from yesterday FINDINGS: Brain: Acute parenchymal hemorrhage in the parasagittal posterior left frontal lobe as previously seen. No suspected enlargement. Methemoglobin has formed with T1 shortening. There is an expected rim of edema. No abnormal adjacent enhancement. Innumerable chronic microhemorrhages in the peripheral brain. Brain volume is normal. Chronic small vessel ischemic type change in the hemispheric white matter. Partially empty sella, nonspecific in isolation. Vascular: Normal flow voids and vascular enhancements Skull and upper cervical spine: Normal marrow signal Sinuses/Orbits: Negative IMPRESSION: No mass or infarct seen underlying the left posterior frontal hematoma. There are however signs of innumerable remote lobar microhemorrhages and white matter gliosis, compatible with amyloid angiopathy or other primary vascular disease. Electronically Signed   By: Jorje Guild M.D.   On: 05/29/2021 10:37   EEG adult  Result Date: 05/28/2021 Tsosie Billing, MD     05/28/2021  7:59 PM [facility]  Tele[facility]urology Consult Services Routine EEG Report Duration: 21 min Patient Name:   Terri Werner Date of Birth:   [birth year] Identification Number:   MRN - [id number] Date of Study:   05/28/2021 08:11:10 Indication: Spells, Eval for Seizures, Technical Summary: A routine 20 channel electroencephalogram using the international 10-20 system of electrode placement was performed. Background: 9-10 Hz, Posterior dominant rhythm that attenuated with eye Opening States      Awake Activation Procedures Hyperventilation: Performed : Photic Stimulation: Classification: Normal : There were no Epileptiform discharges Diagnosis: Normal Awake study. There are no epileptiform discharges. Clinical Correlation: This is a normal study. The absence of interictal epileptiform abnormalities does not exclude the diagnosis of a seizure disorder. Dr Tsosie Billing [facility] [phone] Case [id number]  ECHOCARDIOGRAM COMPLETE  Result Date: 05/28/2021    ECHOCARDIOGRAM REPORT   Patient Name:   Terri Werner Date of Exam: 05/28/2021 Medical Rec #:  [id number]                   Height:       62.0 in Accession #:    [id number]                  Weight:       229.9 lb Date of Birth:  1950/06/17                   BSA:          2.028 m Patient Age:    41 years                    BP:           116/61 mmHg Patient Gender: F                           HR:

## 2021-06-26 ENCOUNTER — Other Ambulatory Visit (HOSPITAL_COMMUNITY): Payer: Self-pay

## 2021-06-26 MED ORDER — CYCLOBENZAPRINE HCL 5 MG PO TABS
5.0000 mg | ORAL_TABLET | Freq: Three times a day (TID) | ORAL | 0 refills | Status: DC | PRN
  Filled 2021-06-26: qty 30, 10d supply, fill #0

## 2021-06-26 MED ORDER — MAGNESIUM OXIDE 400 MG PO TABS
250.0000 mg | ORAL_TABLET | Freq: Every day | ORAL | 0 refills | Status: DC
  Filled 2021-06-26: qty 15, 30d supply, fill #0

## 2021-06-26 MED ORDER — DICLOFENAC SODIUM 1 % EX GEL
4.0000 g | Freq: Four times a day (QID) | CUTANEOUS | 0 refills | Status: DC
Start: 2021-06-26 — End: 2023-03-05
  Filled 2021-06-26: qty 200, 14d supply, fill #0

## 2021-06-26 MED ORDER — GABAPENTIN 300 MG PO CAPS
300.0000 mg | ORAL_CAPSULE | Freq: Three times a day (TID) | ORAL | 0 refills | Status: DC
  Filled 2021-06-26: qty 90, 30d supply, fill #0

## 2021-06-26 MED ORDER — PANTOPRAZOLE SODIUM 40 MG PO TBEC
40.0000 mg | DELAYED_RELEASE_TABLET | Freq: Every day | ORAL | 0 refills | Status: DC
  Filled 2021-06-26: qty 30, 30d supply, fill #0

## 2021-06-26 MED ORDER — SENNOSIDES-DOCUSATE SODIUM 8.6-50 MG PO TABS: 1.0000 | ORAL_TABLET | Freq: Two times a day (BID) | ORAL | Status: DC

## 2021-06-26 MED ORDER — LIDOCAINE 5 % EX PTCH
3.0000 | MEDICATED_PATCH | CUTANEOUS | 0 refills | Status: DC
  Filled 2021-06-26: qty 60, 20d supply, fill #0

## 2021-06-26 MED ORDER — RISAQUAD PO CAPS
1.0000 | ORAL_CAPSULE | Freq: Every day | ORAL | 0 refills | Status: DC
  Filled 2021-06-26: qty 30, 30d supply, fill #0

## 2021-06-26 MED ORDER — ROSUVASTATIN CALCIUM 10 MG PO TABS
10.0000 mg | ORAL_TABLET | Freq: Every day | ORAL | 0 refills | Status: DC
  Filled 2021-06-26: qty 30, 30d supply, fill #0

## 2021-06-26 MED ORDER — AMLODIPINE BESYLATE 5 MG PO TABS
5.0000 mg | ORAL_TABLET | Freq: Every day | ORAL | 0 refills | Status: DC
  Filled 2021-06-26: qty 30, 30d supply, fill #0

## 2021-06-26 MED ORDER — ACETAMINOPHEN 325 MG PO TABS
650.0000 mg | ORAL_TABLET | ORAL | Status: AC | PRN
Start: 2021-06-26 — End: ?

## 2021-06-26 MED ORDER — VITAMIN D 50 MCG (2000 UT) PO TABS
2000.0000 [IU] | ORAL_TABLET | Freq: Every day | ORAL | 0 refills | Status: DC
  Filled 2021-06-26: qty 30, 30d supply, fill #0

## 2021-06-26 NOTE — Progress Notes (Signed)
Inpatient Rehabilitation Care Coordinator Discharge Note   Patient Details  Name: Terri Werner MRN: [id number] Date of Birth: 05-13-1951   Discharge location: Home  Length of Stay: 27 days  Discharge activity level: Min/Mod  Home/community participation: Family support (sister, daughter, niece and others)  Patient response PF:[id number] Literacy - How often do you need to have someone help you when you read instructions, pamphlets, or other written material from your doctor or pharmacy?: Never  Patient response KM:[id number] Isolation - How often do you feel lonely or isolated from those around you?: Never  Services provided included: SW, Pharmacy, TR, CM, RN, SLP, PT, OT, RD, MD  Financial Services:  Financial Services Utilized: Private Insurance [place] offered to/list presented to: Renee (sister) / Teacher, early years/pre (Daughter)  Follow-up services arranged:  [place]: [facility]         Patient response to transportation need: Is the patient able to respond to transportation needs?: Yes In the past 12 months, has lack of transportation kept you from medical appointments or from getting medications?: No In the past 12 months, has lack of transportation kept you from meetings, work, or from getting things needed for daily living?: No    Comments (or additional information):  Patient/Family verbalized understanding of follow-up arrangements:  Yes  Individual responsible for coordination of the follow-up plan: patient/Belinda  Confirmed correct DME delivered: Dyanne Iha 06/26/2021    Dyanne Iha

## 2021-06-26 NOTE — Progress Notes (Signed)
Patient ID: Terri Werner, female   DOB: [birth year], 71 y.o.   MRN: [id number]  Sw made aware that [employer] is attempting to make contact. Family provided with [employer] contact information to reach out. SW will cont to follow up.

## 2021-06-26 NOTE — Progress Notes (Signed)
Physical Therapy Session Note  Patient Details  Name: Terri Werner MRN: [id number] Date of Birth: Feb 22, 1951  Today's Date: 06/26/2021 PT Individual Time: 1331-1430 PT Individual Time Calculation (min): 59 min   Short Term Goals: Week 1:  PT Short Term Goal 1 (Week 1): Pt will perform supine<>sit with mod assist of 1 PT Short Term Goal 1 - Progress (Week 1): Met PT Short Term Goal 2 (Week 1): Pt will perform sit<>stands using LRAD with mod assist of 1 PT Short Term Goal 2 - Progress (Week 1): Met PT Short Term Goal 3 (Week 1): Pt will perform bed<>chair transfers using LRAD with mod assist of 1 PT Short Term Goal 3 - Progress (Week 1): Met PT Short Term Goal 4 (Week 1): Pt will ambulate at least 4ft using LRAD with +2 assist PT Short Term Goal 4 - Progress (Week 1): Met Week 2:  PT Short Term Goal 1 (Week 2): Pt will perform sit<>stands using LRAD with min assist PT Short Term Goal 1 - Progress (Week 2): Progressing toward goal (still inconsistent requiring intermittent light mod assist) PT Short Term Goal 2 (Week 2): Pt will perform bed<>chair transfers using LRAD with min assist PT Short Term Goal 2 - Progress (Week 2): Progressing toward goal (still inconsistent requiring intermittent light mod assist) PT Short Term Goal 3 (Week 2): Pt will ambulate at least 70ft using LRAD with max assist of 1 and +2 min assist PT Short Term Goal 3 - Progress (Week 2): Met PT Short Term Goal 4 (Week 2): Pt will participate in Berg Balance Test demonstrating improvement in score by at least 7 points indicating decreased fall risk PT Short Term Goal 4 - Progress (Week 2): Met Week 3:  PT Short Term Goal 1 (Week 3): Pt will perform sit<>stands using LRAD with min assist consistently PT Short Term Goal 1 - Progress (Week 3): Met PT Short Term Goal 2 (Week 3): Pt will perform bed<>chair transfers using LRAD with min assist consistently PT Short Term Goal 2 - Progress (Week 3): Met PT Short  Term Goal 3 (Week 3): Pt will ambulate at least 15ft using LRAD with mod assist of 1 and +2 supervision for safety PT Short Term Goal 3 - Progress (Week 3): Met PT Short Term Goal 4 (Week 3): Pt will initiate stair training PT Short Term Goal 4 - Progress (Week 3): Progressing toward goal  Skilled Therapeutic Interventions/Progress Updates:    Pt seated in w/c on arrival and agreeable to therapy. No complaint of pain. Pt propelled w/c with BUE throughout session, setting up transfers, navigating elevators, and navigating tight environments. Supervision for leg rests as pt just received loaner w/c. Sit to stand with and without RW min A-CGA at times during session. Stand pivot transfer to bed in ADL apart with RW, mod A for balance. In pt room, pt demoed min a SPT using bed rail for support. Pt required min A for bed mobility and stool placed under feet to scoot back on bed. Discussed strategies and home lay out for d/c tomorrow. Pt then returned to room and participated in berg balance test as documented below. Pt remained in w/c and was left with all needs in reach.   Therapy Documentation Precautions:  Precautions Precautions: Fall Precaution Comments: R hemiparesis, R LE extensor tone Restrictions Weight Bearing Restrictions: No  Balance: Balance Balance Assessed: Yes Standardized Balance Assessment Standardized Balance Assessment: Berg Balance Test Berg Balance Test Sit to Stand: Able to  stand using hands after several tries Standing Unsupported: Able to stand 2 minutes with supervision Sitting with Back Unsupported but Feet Supported on Floor or Stool: Able to sit safely and securely 2 minutes Stand to Sit: Controls descent by using hands (R pelvic weight shift) Transfers: Needs one person to assist Standing Unsupported with Eyes Closed: Able to stand 10 seconds with supervision Standing Ubsupported with Feet Together: Needs help to attain position and unable to hold for 15  seconds From Standing, Reach Forward with Outstretched Arm: Can reach forward >12 cm safely (5") From Standing Position, Pick up Object from Floor: Unable to pick up and needs supervision (Able to pick up, but sat to chair/couldn't stand from low squat) From Standing Position, Turn to Look Behind Over each Shoulder: Turn sideways only but maintains balance Turn 360 Degrees: Needs assistance while turning Standing Unsupported, Alternately Place Feet on Step/Stool: Needs assistance to keep from falling or unable to try Standing Unsupported, One Foot in Front: Able to take small step independently and hold 30 seconds Standing on One Leg: Unable to try or needs assist to prevent fall Total Score: 24     Therapy/Group: Individual Therapy  Juluis Rainier 06/26/2021, 3:04 PM

## 2021-06-26 NOTE — Progress Notes (Signed)
PROGRESS NOTE   Subjective/Complaints:  Cousin in room , discussed equipment bracing and orthotics   ROS:  Pt denies SOB, abd pain, CP, N/V/C/D, and vision changes   Objective:   No results found. No results for input(s): WBC, HGB, HCT, PLT in the last 72 hours.  No results for input(s): NA, K, CL, CO2, GLUCOSE, BUN, CREATININE, CALCIUM in the last 72 hours.   Intake/Output Summary (Last 24 hours) at 06/26/2021 0941 Last data filed at 06/26/2021 0810 Gross per 24 hour  Intake 960 ml  Output --  Net 960 ml         Physical Exam: Vital Signs Blood pressure 131/70, pulse 64, temperature 97.8 F (36.6 C), temperature source Oral, resp. rate 18, height 5\' 2"  (1.575 m), weight 103.2 kg, SpO2 100 %.  General: No acute distress Mood and affect are appropriate Heart: Regular rate and rhythm no rubs murmurs or extra sounds Lungs: Clear to auscultation, breathing unlabored, no rales or wheezes Abdomen: Positive bowel sounds, soft nontender to palpation, nondistended Extremities: No clubbing, cyanosis, or edema Skin: No evidence of breakdown, no evidence of rash  Neurologic: Cranial nerves II through XII intact, motor strength is 5/5 in bilateral deltoid, bicep, tricep, grip, 3- Right HF 3/5 Right knee ext 0/5 RIght foot and ankle  and 5/5/ Left hip flexor, knee extensors, ankle dorsiflexor and plantar flexor Sensory exam reduced  sensation to light touch RLE but now able to ID light touch on foot! + LT sensation RLE and reduce proprioception - improved   Musculoskeletal: Full range of motion in all 4 extremities. No joint swelling Psych: pleasant and cooperative    Assessment/Plan: 1. Functional deficits which require 3+ hours per day of interdisciplinary therapy in a comprehensive inpatient rehab setting. Physiatrist is providing close team supervision and 24 hour management of active medical problems listed  below. Physiatrist and rehab team continue to assess barriers to discharge/monitor patient progress toward functional and medical goals  Care Tool:  Bathing    Body parts bathed by patient: Right arm, Left arm, Chest, Abdomen, Right upper leg, Left upper leg, Face, Right lower leg, Left lower leg, Front perineal area, Buttocks   Body parts bathed by helper: Buttocks     Bathing assist Assist Level: Contact Guard/Touching assist     Upper Body Dressing/Undressing Upper body dressing   What is the patient wearing?: Bra, Pull over shirt    Upper body assist Assist Level: Set up assist    Lower Body Dressing/Undressing Lower body dressing      What is the patient wearing?: Pants, Incontinence brief     Lower body assist Assist for lower body dressing: Minimal Assistance - Patient > 75%     Toileting Toileting    Toileting assist Assist for toileting: Dependent - Patient 0% (stedy)     Transfers Chair/bed transfer  Transfers assist     Chair/bed transfer assist level: Moderate Assistance - Patient 50 - 74% (min/mod A squat pivot)     Locomotion Ambulation   Ambulation assist   Ambulation activity did not occur: Safety/medical concerns  Assist level: Moderate Assistance - Patient 50 - 74% Assistive device: Walker-rolling (R  LE AFO & leg lifter) Max distance: 160ft   Walk 10 feet activity   Assist  Walk 10 feet activity did not occur: Safety/medical concerns        Walk 50 feet activity   Assist Walk 50 feet with 2 turns activity did not occur: Safety/medical concerns         Walk 150 feet activity   Assist Walk 150 feet activity did not occur: Safety/medical concerns         Walk 10 feet on uneven surface  activity   Assist Walk 10 feet on uneven surfaces activity did not occur: Safety/medical concerns         Wheelchair     Assist Is the patient using a wheelchair?: Yes Type of Wheelchair: Manual    Wheelchair assist  level: Supervision/Verbal cueing, Set up assist Max wheelchair distance: 95ft    Wheelchair 50 feet with 2 turns activity    Assist        Assist Level: Dependent - Patient 0%   Wheelchair 150 feet activity     Assist      Assist Level: Dependent - Patient 0%   Blood pressure 131/70, pulse 64, temperature 97.8 F (36.6 C), temperature source Oral, resp. rate 18, height 5\' 2"  (1.575 m), weight 103.2 kg, SpO2 100 %.    Medical Problem List and Plan: 1. Functional deficits secondary to IPH 05/27/21 in the left parasagittal frontal parietal lobes likely secondary to uncontrolled hypertension RLE> RUE weakness              -patient may shower             -ELOS/Goals: MinA 2/14- some improvement with sensation RLE   2.  DVT: discussed recommendation not to anti-coagulate given recent IPH and she is in agreement -DVT/anticoagulation:  Mechanical:  Antiembolism stockings, knee (TED hose) Bilateral lower extremities             -antiplatelet therapy: N/A 3. Lower back pain: kpad added. Tylenol as needed, order prn [facility]   [facility], had been to pain management , no long duration relief with lumbar RF 4. Mood: Provide emotional support             -antipsychotic agents: N/A 5. Neuropsych: This patient is capable of making decisions on her own behalf. 6. Skin/Wound Care: Routine skin checks 7. Fluids/Electrolytes/Nutrition: Routine in and outs with follow-up chemistries 8.  Hypertension.  Start magnesium gluconate 250mg  HS .Continue amlodipine 5mg  daily.  Vitals:   06/25/21 1931 06/26/21 0348  BP: (!) 151/81 131/70  Pulse: 75 64  Resp: 18 18  Temp: 98.1 F (36.7 C) 97.8 F (36.6 C)  SpO2: 100% 100%  2/13 controlled  9.  Gram-negative rod UTI.  Completed Keflex- resolved  10.  GERD.  continue Protonix 11.  Hyperlipidemia.  Rosuvastatin 5 mg daily resume at discharge. 12.  Obesity.  BMI 42.05.  Dietary follow-up 13. Constipation: continue magnesium gluconate 250mg  HS.   14.  Hx of lumbar spinal stenosis and spondylosis, has had Lumbar RF in past (2018) , pt reports improvement with muscle relaxers will trial  15.  Chest pain at rest (now resolved) some improvement with sitting position, pt denies hx of reflux but is on protonix , EKG 06/06/21 unchanged vs 05/27/21, in fact T waves normalized  Troponin x 2, neg Had relief with Maalox likely GI  16. Right lower extremity pain:   Gabapentin was just increased to TID on 1/27--pain seems better 1/31.  Continue with current plan.  17. Right foot drop: continue dynamic night splint qhs,pt states it was uncomfortable "cutting into foot" will ask PT to assess fit ,  has permanenet AFO pt states fit is good without pressure areas   Discussion with pt and caregiver regarding d/c process, questions answered  LOS: 26 days A FACE TO FACE EVALUATION WAS PERFORMED  Charlett Blake 06/26/2021, 9:41 AM

## 2021-06-26 NOTE — Progress Notes (Signed)
Occupational Therapy Discharge Summary  Patient Details  Name: Terri Werner MRN: [id number] Date of Birth: Jun 19, 1950  Today's Date: 06/26/2021 OT Individual Time: 0802-0901 OT Individual Time Calculation (min): 59 min   Session Note:  Pt with her cousin present for education during session.  Completed toilet transfer with use of the Stedy secondary to bathroom setup.  She was able to pull up on the [facility] and manage her clothing at min assist level prior to sitting.  Min assist also for completion of toilet hygiene prior to transfer over to the tub bench.  Once on the bench she was able to remove her gown with setup and then her brief at min assist level from sitting.  She was able to turn around and lift her LEs over the edge of the walk-in shower at min assist level as well.  She completed all UB bathing at supervision level with LB bathing at min assist sit to stand with use of the grab bars for support.  Therapist assisted with rinsing front and back peri area during standing to keep from getting him wet.  After completion of shower, she was able to transfer stand pivot to the wheelchair at mod assist secondary to bathroom setup.  She completed all UB dressing with supervision and LB dressing with mod assist to donn her brief and pants.  Had her cousin assist with standing to pull items over her hips with min assist.  She then needed total assist to donn the right compression stocking, AFO, and shoe.  Min assist for the left.  Finished session with pt sitting in the wheelchair with completion or oral hygiene at setup level.  Call button and phone in reach with safety alarm in place and family present for next session.   Patient has met 10 of 13 long term goals due to improved activity tolerance, improved balance, postural control, ability to compensate for deficits, functional use of  RIGHT upper and RIGHT lower extremity, improved attention, improved awareness, and improved coordination.   Patient to discharge at overall Mod Assist level.  Patient's care partner is independent to provide the necessary physical and cognitive assistance at discharge.    Reasons goals not met: Pt continues to need cueing for carryover of techniques day to day as well as for anticipatory awareness prior to transfers.   Recommendation:  Patient will benefit from ongoing skilled OT services in home health setting to continue to advance functional skills in the area of BADL, iADL, and Reduce care partner burden.  Pt will continue to benefit from ongoing therapy to increase balance, RLE strength, and functional performance of selfcare tasks to a modified independent level.   Equipment: Drop arm commode, tub bench  Reasons for discharge: treatment goals met and discharge from hospital  Patient/family agrees with progress made and goals achieved: Yes  OT Discharge Precautions/Restrictions  Precautions Precautions: Fall Precaution Comments: R hemiparesis, R LE extensor tone Restrictions Weight Bearing Restrictions: No  Pain   No report of pain ADL ADL Eating: Independent Where Assessed-Eating: Wheelchair Grooming: Setup Where Assessed-Grooming: Wheelchair Upper Body Bathing: Setup Where Assessed-Upper Body Bathing: Chair, Air traffic controller Lower Body Bathing: Minimal assistance Where Assessed-Lower Body Bathing: Chair, Water quality scientist Dressing: Setup Where Assessed-Upper Body Dressing: Wheelchair Lower Body Dressing: Moderate assistance Where Assessed-Lower Body Dressing: Wheelchair Toileting: Moderate assistance Where Assessed-Toileting: Hydrologist: Minimal Dentist Method: Ambulance person: Drop arm bedside commode Tub/Shower Transfer: Not assessed Tub/Shower Transfer Method: Stand pivot Tub/Shower  Equipment: Grab bars, Insurance underwriter: Insurance underwriter Method: Counselling psychologist: Emergency planning/management officer, Grab bars Vision Baseline Vision/History: 1 Wears glasses Patient Visual Report: No change from baseline Vision Assessment?: Yes Eye Alignment: Within Functional Limits Ocular Range of Motion: Within Functional Limits Alignment/Gaze Preference: Within Defined Limits Tracking/Visual Pursuits: Able to track stimulus in all quads without difficulty Saccades: Within functional limits Convergence: Within functional limits Visual Fields: No apparent deficits Perception  Perception: Impaired Inattention/Neglect: Does not attend to right side of body Spatial Orientation: impaired spatial awareness with impaired midline orientation, pusher tendencies to the right with transitional movements Praxis Praxis: Impaired Praxis Impairment Details: Motor planning Praxis-Other Comments: motor impersistence Cognition Overall Cognitive Status: Within Functional Limits for tasks assessed Arousal/Alertness: Awake/alert Orientation Level: Oriented X4 Year: 2023 Month: February Day of Week: Correct Attention: Focused;Sustained;Selective Focused Attention: Appears intact Sustained Attention: Appears intact Selective Attention: Appears intact Memory: Impaired Memory Impairment: Storage deficit;Decreased recall of new information Immediate Memory Recall: Sock;Blue;Bed Memory Recall Sock: Without Cue Memory Recall Blue: With Cue Memory Recall Bed: Without Cue Awareness: Appears intact Awareness Impairment: Anticipatory impairment Problem Solving: Impaired Problem Solving Impairment: Functional complex Executive Function: Sequencing Sequencing: Impaired Sequencing Impairment: Functional complex Safety/Judgment: Appears intact Comments: Pt still requires cueing to place her RLE under her and scoot to the edge of the chair prior to transitonal movements of scooting or standing. Sensation Sensation Light Touch: Impaired Detail Light Touch Impaired Details:  Impaired RUE Hot/Cold: Appears Intact Proprioception: Appears Intact Proprioception Impaired Details: Impaired RUE;Impaired RLE Stereognosis: Not tested Additional Comments: Pt reports slight decreased light touch acuity in the right hand compared to the left, but able to detect therapist touching her.  Proprioception intact in the RUE with testing but exhibits decreased kinesthetic positioning as she will have it positioned behind her with standing and not be aware of it. Coordination Gross Motor Movements are Fluid and Coordinated: No Fine Motor Movements are Fluid and Coordinated: No Coordination and Movement Description: Slight gross and FM coordination deficits in the RUE compared to the left but uses at a non-dominant level independently with selfcare tasks. Motor  Motor Motor: Hemiplegia Motor - Discharge Observations: RLE hemiparesis Mobility  Bed Mobility Bed Mobility: Supine to Sit;Sit to Supine Supine to Sit: Minimal Assistance - Patient > 75% Sit to Supine: Minimal Assistance - Patient > 75% Transfers Sit to Stand: Minimal Assistance - Patient > 75% Stand to Sit: Minimal Assistance - Patient > 75%  Trunk/Postural Assessment  Cervical Assessment Cervical Assessment: Exceptions to [facility] (slight forward head) Thoracic Assessment Thoracic Assessment: Exceptions to [facility] (thoracic rounding in sitting) Lumbar Assessment Lumbar Assessment: Exceptions to [facility] (slight posterior pelvic tilt)  Balance Balance Balance Assessed: Yes Standardized Balance Assessment Standardized Balance Assessment: Berg Balance Test Berg Balance Test Sit to Stand: Able to stand using hands after several tries Standing Unsupported: Able to stand 2 minutes with supervision Sitting with Back Unsupported but Feet Supported on Floor or Stool: Able to sit safely and securely 2 minutes Stand to Sit: Controls descent by using hands (R pelvic weight shift) Transfers: Needs one person to assist Standing  Unsupported with Eyes Closed: Able to stand 10 seconds with supervision Standing Ubsupported with Feet Together: Needs help to attain position and unable to hold for 15 seconds From Standing, Reach Forward with Outstretched Arm: Can reach forward >12 cm safely (5") From Standing Position, Pick up Object from Floor: Unable to pick up and needs supervision (  Able to pick up, but sat to chair/couldn't stand from low squat) From Standing Position, Turn to Look Behind Over each Shoulder: Turn sideways only but maintains balance Turn 360 Degrees: Needs assistance while turning Standing Unsupported, Alternately Place Feet on Step/Stool: Needs assistance to keep from falling or unable to try Standing Unsupported, One Foot in Front: Able to take small step independently and hold 30 seconds Standing on One Leg: Unable to try or needs assist to prevent fall Total Score: 24 Static Sitting Balance Static Sitting - Balance Support: Feet supported Static Sitting - Level of Assistance: 7: Independent Dynamic Sitting Balance Dynamic Sitting - Balance Support: Feet supported Dynamic Sitting - Level of Assistance:  (min guard assist) Static Standing Balance Static Standing - Balance Support: During functional activity;Bilateral upper extremity supported Static Standing - Level of Assistance: 4: Min assist Dynamic Standing Balance Dynamic Standing - Balance Support: During functional activity;Left upper extremity supported Dynamic Standing - Level of Assistance: 3: Mod assist Extremity/Trunk Assessment RUE Assessment RUE Assessment: Exceptions to [facility] Active Range of Motion (AROM) Comments: WFLs General Strength Comments: Strength 4/5 througout, slight decreased FM coordination when compared to the left but does not affect functional use. LUE Assessment LUE Assessment: Within Functional Limits General Strength Comments: strength 4/5 throughout   Sima Lindenberger OTR/L 06/26/2021, 4:34 PM

## 2021-06-26 NOTE — Progress Notes (Signed)
Inpatient Rehabilitation Discharge Medication Review by a Pharmacist  A complete drug regimen review was completed for this patient to identify any potential clinically significant medication issues.  High Risk Drug Classes Is patient taking? Indication by Medication  Antipsychotic No   Anticoagulant No   Antibiotic No   Opioid No   Antiplatelet No   Hypoglycemics/insulin No   Vasoactive Medication Yes Amlodipine - HTN  Chemotherapy No   Other Yes Protonix - GERD Rosuvastatin - HLD Gabapentin - neuropathy     Type of Medication Issue Identified Description of Issue Recommendation(s)  Drug Interaction(s) (clinically significant)     Duplicate Therapy     Allergy     No Medication Administration End Date     Incorrect Dose     Additional Drug Therapy Needed     Significant med changes from prior encounter (inform family/care partners about these prior to discharge).    Other       Clinically significant medication issues were identified that warrant physician communication and completion of prescribed/recommended actions by midnight of the next day:  No  Name of provider notified for urgent issues identified:   Provider Method of Notification:     Pharmacist comments:   Time spent performing this drug regimen review (minutes):  [address] PharmD, [place], AAHIVP, CPP Infectious Disease Pharmacist 06/26/2021 2:11 PM

## 2021-06-26 NOTE — Progress Notes (Signed)
Physical Therapy Session Note  Patient Details  Name: Terri Werner MRN: [id number] Date of Birth: 01/02/1951  Today's Date: 06/26/2021 PT Individual Time: 0945-1100 PT Individual Time Calculation (min): 75 min   Short Term Goals: Week 4:  PT Short Term Goal 1 (Week 4): = to LTGs based on ELOS  Skilled Therapeutic Interventions/Progress Updates:     Pt presenting sitting in her w/c, agreeable to PT tx. Denies pain. Her cousin, Kieth Brightly, is at the bedside for family education/training. Pt with her R AFO on.   Began by having lengthy conversation regarding home safety, DME rec's, deficits related to her CVA, and current mobility status. Reminded to abstain from ambulating with family, only safe to ambulate with licensed physical or occupational therapists - family and pt voiced understanding of all. Pt with questions regarding follow up outpatient therapy appointments - recommending either outpatient neuro PT at [facility] (pt lives in [place]) or at [address] in [place]. Provided phone numbers/address for follow up care.   Transported in w/c downstairs to 31M rehab gym for time.   Assisted pt with stand<>pivot transfer with minA from w/c to mat table - assist for supporting due stepping pattern and VC for R foot placement due to adduction.   Reviewed w/c parts (leg rests, arm rests, w/c cushion, folding chair) and family demonstrated ability to manage.   Reviewed leg lifter and family member took a video of therapist donning/doffing for future therapists to have access to as a resource.   Instructed in gait training with RW - ambulated ~23ft with min/modA and RW with Student PT video taping for future resource for pt's [facility] therapists. Facilitating primarily needed for lateral weight shifting, mildly advancing RLE, and placement of RLE into abduction.   Focused remainder of session on functional transfers (squat pivot) from w/c to mat table. PT completed x2 reps for reviewing with family  and then [place] completed x4 repetitions total. Kieth Brightly demonstrated safe body mechanics and ability to assist pt with squat pivot transfers.  Pt transported back upstairs to her room for time. Remained seated in w/c with safety belt alarm on, all immediate needs within reach. All questions/concerns addressed and pt appreciative of all therapists during her rehab stay.   Therapy Documentation Precautions:  Precautions Precautions: Fall Precaution Comments: R hemiparesis, R LE extensor tone Restrictions Weight Bearing Restrictions: No General:    Therapy/Group: Individual Therapy  Alger Simons 06/26/2021, 7:38 AM

## 2021-06-28 ENCOUNTER — Telehealth: Payer: Self-pay

## 2021-06-28 DIAGNOSIS — E669 Obesity, unspecified: Secondary | ICD-10-CM | POA: Diagnosis not present

## 2021-06-28 DIAGNOSIS — I10 Essential (primary) hypertension: Secondary | ICD-10-CM | POA: Diagnosis not present

## 2021-06-28 DIAGNOSIS — I82442 Acute embolism and thrombosis of left tibial vein: Secondary | ICD-10-CM | POA: Diagnosis not present

## 2021-06-28 DIAGNOSIS — E785 Hyperlipidemia, unspecified: Secondary | ICD-10-CM | POA: Diagnosis not present

## 2021-06-28 DIAGNOSIS — M21371 Foot drop, right foot: Secondary | ICD-10-CM | POA: Diagnosis not present

## 2021-06-28 DIAGNOSIS — M48061 Spinal stenosis, lumbar region without neurogenic claudication: Secondary | ICD-10-CM | POA: Diagnosis not present

## 2021-06-28 DIAGNOSIS — I69151 Hemiplegia and hemiparesis following nontraumatic intracerebral hemorrhage affecting right dominant side: Secondary | ICD-10-CM | POA: Diagnosis not present

## 2021-06-28 DIAGNOSIS — M47816 Spondylosis without myelopathy or radiculopathy, lumbar region: Secondary | ICD-10-CM | POA: Diagnosis not present

## 2021-06-28 DIAGNOSIS — I82452 Acute embolism and thrombosis of left peroneal vein: Secondary | ICD-10-CM | POA: Diagnosis not present

## 2021-06-28 DIAGNOSIS — G8929 Other chronic pain: Secondary | ICD-10-CM | POA: Diagnosis not present

## 2021-06-28 DIAGNOSIS — K219 Gastro-esophageal reflux disease without esophagitis: Secondary | ICD-10-CM | POA: Diagnosis not present

## 2021-06-28 DIAGNOSIS — G43909 Migraine, unspecified, not intractable, without status migrainosus: Secondary | ICD-10-CM | POA: Diagnosis not present

## 2021-06-28 DIAGNOSIS — K5730 Diverticulosis of large intestine without perforation or abscess without bleeding: Secondary | ICD-10-CM | POA: Diagnosis not present

## 2021-06-28 DIAGNOSIS — M170 Bilateral primary osteoarthritis of knee: Secondary | ICD-10-CM | POA: Diagnosis not present

## 2021-06-28 DIAGNOSIS — K5900 Constipation, unspecified: Secondary | ICD-10-CM | POA: Diagnosis not present

## 2021-06-28 DIAGNOSIS — Z9181 History of falling: Secondary | ICD-10-CM | POA: Diagnosis not present

## 2021-06-28 NOTE — Telephone Encounter (Signed)
Jeani Hawking, PT from [place] requesting verbal orders for HHPT 1wk1, 2wk8. Order approved and given

## 2021-06-29 ENCOUNTER — Telehealth: Payer: Self-pay

## 2021-06-29 NOTE — Telephone Encounter (Signed)
Transitional Care call--Renee-sister    Are you/is patient experiencing any problems since coming home? No Are there any questions regarding any aspect of care? No Are there any questions regarding medications administration/dosing? No Are meds being taken as prescribed? Yes Patient should review meds with caller to confirm Have there been any falls? No Has Home Health been to the house and/or have they contacted you? Yes If not, have you tried to contact them? Can we help you contact them? Are bowels and bladder emptying properly? Yes Are there any unexpected incontinence issues? No If applicable, is patient following bowel/bladder programs? Any fevers, problems with breathing, unexpected pain? No Are there any skin problems or new areas of breakdown? No Has the patient/family member arranged specialty MD follow up (ie cardiology/neurology/renal/surgical/etc)?  Yes Can we help arrange? Does the patient need any other services or support that we can help arrange? No Are caregivers following through as expected in assisting the patient? Yes Has the patient quit smoking, drinking alcohol, or using drugs as recommended? Yes  Appointment time 1:40 pm, arrive time 1:20 pm with Zella Ball on 07/10/21 then Dr. Letta Pate  [address]

## 2021-06-29 NOTE — Telephone Encounter (Signed)
Transition Care Management Follow-up Telephone Call Date of discharge and from where: 06/27/21 [place] How have you been since you were released from the hospital? Pt states she is doing well, resting, no pain Any questions or concerns? No  Items Reviewed: Did the pt receive and understand the discharge instructions provided? Yes  Medications obtained and verified? Yes  Other? No  Any new allergies since your discharge? No  Dietary orders reviewed? Yes Do you have support at home? Yes   Home Care and Equipment/Supplies: Were home health services ordered? yes If so, what is the name of the agency? [place] up a time to come to the patient's home? yes Were any new equipment or medical supplies ordered?  Yes: walker , transfer bench, 3N1 What is the name of the medical supply agency? [facility] Were you able to get the supplies/equipment? yes Do you have any questions related to the use of the equipment or supplies? No  Functional Questionnaire: (I = Independent and D = Dependent) ADLs: I  Bathing/Dressing- I - with assistance  Meal Prep- I  Eating- I  Maintaining continence- I  Transferring/Ambulation- I  Managing Meds- I  Follow up appointments reviewed:  PCP Hospital f/u appt confirmed? Yes  Scheduled to see Dr. Rosana Berger on 07/12/21 @ 11:00. [place] [facility] f/u appt confirmed? Yes  Scheduled to see Danella Sensing physical medicine and rehab on 07/10/21. Are transportation arrangements needed? No  If their condition worsens, is the pt aware to call PCP or go to the Emergency Dept.? Yes Was the patient provided with contact information for the PCP's office or ED? Yes Was to pt encouraged to call back with questions or concerns? Yes

## 2021-07-04 ENCOUNTER — Other Ambulatory Visit (HOSPITAL_COMMUNITY): Payer: Self-pay

## 2021-07-05 ENCOUNTER — Encounter: Payer: Self-pay | Admitting: Family Medicine

## 2021-07-05 ENCOUNTER — Other Ambulatory Visit (HOSPITAL_COMMUNITY): Payer: Self-pay

## 2021-07-06 ENCOUNTER — Other Ambulatory Visit (HOSPITAL_COMMUNITY): Payer: Self-pay

## 2021-07-06 ENCOUNTER — Encounter: Payer: Self-pay | Admitting: Neurology

## 2021-07-06 DIAGNOSIS — I619 Nontraumatic intracerebral hemorrhage, unspecified: Secondary | ICD-10-CM

## 2021-07-06 NOTE — Progress Notes (Signed)
Informed by the SATURN trial follow up team that pt is still taking the crestor. I called pt and reiterated to her that she should NOT take crestor based on the randomization of the trial. She is in agreement. I asked her to put the crestor bottle away from her other medications and she did. I also communicated with PCP Dr. Ancil Boozer and she will also remind pt when pt follows up with her next time.   Rosalin Hawking, MD PhD Stroke Neurology 07/06/2021 9:31 AM

## 2021-07-07 ENCOUNTER — Telehealth: Payer: Self-pay | Admitting: Physical Medicine & Rehabilitation

## 2021-07-07 NOTE — Telephone Encounter (Signed)
Terri Werner or Terri Werner from [facility] needed to let doctor know that patient declined OT home health.  Any questions please call her at [phone].

## 2021-07-10 ENCOUNTER — Other Ambulatory Visit: Payer: Self-pay

## 2021-07-10 ENCOUNTER — Encounter: Payer: Medicare Other | Attending: Registered Nurse | Admitting: Registered Nurse

## 2021-07-10 ENCOUNTER — Encounter: Payer: Self-pay | Admitting: Registered Nurse

## 2021-07-10 VITALS — BP 145/84 | HR 80 | Ht 62.0 in

## 2021-07-10 DIAGNOSIS — M5136 Other intervertebral disc degeneration, lumbar region: Secondary | ICD-10-CM | POA: Diagnosis not present

## 2021-07-10 DIAGNOSIS — M7061 Trochanteric bursitis, right hip: Secondary | ICD-10-CM

## 2021-07-10 DIAGNOSIS — M47816 Spondylosis without myelopathy or radiculopathy, lumbar region: Secondary | ICD-10-CM

## 2021-07-10 DIAGNOSIS — I10 Essential (primary) hypertension: Secondary | ICD-10-CM

## 2021-07-10 DIAGNOSIS — M25561 Pain in right knee: Secondary | ICD-10-CM

## 2021-07-10 DIAGNOSIS — I619 Nontraumatic intracerebral hemorrhage, unspecified: Secondary | ICD-10-CM

## 2021-07-10 NOTE — Progress Notes (Signed)
Subjective:    Patient ID: Terri Werner, female    DOB: 06/24/1950, 71 y.o.   MRN: [id number]  HPI: Terri Werner is a 71 y.o. female who is here for transitional care visit for follow up of her  Lumbar Facet Arthropathy, Lumbar Degenerative Disc Disease, Essential Hypertension, Right Greater Trochanter Bursitis, Acute, Acute Pain of Right Knee and Left Posterior tibial DVT/ left peroneal. She presented on 05/27/2021 via EMS to [facility] for right lower extremities weakness.  Dr. Wilford Corner: H&P: Note HPI: Terri Werner is a 71 y.o. female past medical history of hypertension ran out of her medications and has not been doing them for a while, with sudden onset of right leg weakness.  She drove herself to church this morning and in the afternoon noted that she cannot move her right leg.  She was brought in as an acute code stroke to [facility] hospital where she was found to have a left high frontal ICH.  Last known well 1300 hrs.  Reported a mild headache that is better with Tylenol.  Does not report any visual changes.  Does not report any nausea or vomiting.  Does not report any chest pain palpitations shortness of breath. She said she was noncompliant to her medications because of some insurance issues and pharmacy switch over this and has not been taking amlodipine since October.  CT Head: WO Contrast:  IMPRESSION: Acute parenchymal hemorrhage in the parasagittal left frontoparietal lobes with mild edema. No substantial mass effect.  CT Angio:  IMPRESSION: No abnormal vascularity in the region of hemorrhage.   No hemodynamically significant stenosis.   CT: Venogram:  IMPRESSION: No evidence of dural sinus thrombosis.  Repeat: MRI: Follow Up 0n 05/29/2021 IMPRESSION: No mass or infarct seen underlying the left posterior frontal hematoma. There are however signs of innumerable remote lobar microhemorrhages and white matter gliosis,  compatible with amyloid angiopathy or other primary vascular disease.  Neurology Following  Ms. Werner was admitted to inpatient rehabilitation on 05/31/2021 and was discharged home on 06/27/2020. She is receiving Home Health Therapy with [facility]. She states she is walking with her walker in her home, she arrived in wheelchair today She states she has pain in her lower back, right hip and right knee pain. Also reports she has a good appetite.     Pain Inventory Average Pain 7 Pain Right Now 7 My pain is constant, burning, and stabbing  LOCATION OF PAIN  lower back, right hip, right knee  BOWEL Number of stools per week: 3 Oral laxative use Yes  Type of laxative Senna  BLADDER Normal and Pads Bladder incontinence Yes  Frequent urination Yes  Leakage with coughing No  Difficulty starting stream No  Incomplete bladder emptying No    Mobility walk with assistance use a walker ability to climb steps?  no do you drive?  no use a wheelchair needs help with transfers  Function I need assistance with the following:  dressing, bathing, toileting, meal prep, household duties, and shopping  Neuro/Psych bladder control problems trouble walking depression anxiety  Prior Studies Any changes since last visit?  no  Physicians involved in your care Any changes since last visit?  no   Family History  Problem Relation Age of Onset   Breast cancer Sister 99   Hypertension Mother    Alzheimer's disease Mother    Dementia Mother    Hypertension Maternal Grandfather    Hyperlipidemia Maternal Grandfather  Birth defects Maternal Grandmother        Bladder   Colon cancer Maternal Grandmother    Congestive Heart Failure Father    Hyperlipidemia Father    Dementia Paternal Grandmother    Dementia Paternal Grandfather    Thyroid disease Sister    Social History   Socioeconomic History   Marital status: Widowed    Spouse name: Not on  file   Number of children: 2   Years of education: Not on file   Highest education level: Master's degree (e.g., MA, MS, MEng, MEd, MSW, MBA)  Occupational History   Occupation: [employer] - [place] [employer]    Comment: Full Time    Employer: [employer]  Tobacco Use   Smoking status: Never   Smokeless tobacco: Never  Vaping Use   Vaping Use: Never used  Substance and Sexual Activity   Alcohol use: Yes    Comment: rare   Drug use: No   Sexual activity: Not Currently    Partners: Male  Other Topics Concern   Not on file  Social History Narrative   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      Regular Exercise -  NO   Daily Caffeine Use:  1 soda/tea          Social Determinants of Health   Financial Resource Strain: Not on file  Food Insecurity: Not on file  Transportation Needs: Not on file  Physical Activity: Not on file  Stress: Not on file  Social Connections: Not on file   Past Surgical History:  Procedure Laterality Date   ABDOMINAL HYSTERECTOMY  2006   APPENDECTOMY  1980   CESAREAN SECTION  1981   COLONOSCOPY WITH PROPOFOL N/A 11/01/2014   Procedure: COLONOSCOPY WITH PROPOFOL;  Surgeon: Scot Jun, MD;  Location: [facility] ENDOSCOPY;  Service: Endoscopy;  Laterality: N/A;   COLONOSCOPY WITH PROPOFOL N/A 01/05/2020   Procedure: COLONOSCOPY WITH PROPOFOL;  Surgeon: Regis Bill, MD;  Location: [facility] ENDOSCOPY;  Service: Endoscopy;  Laterality: N/A;   ECTOPIC PREGNANCY SURGERY  1980   HERNIA REPAIR  2006   Umbilical   KNEE ARTHROSCOPY Left 2006   torn meniscus   TONSILLECTOMY  1971   TOTAL ABDOMINAL HYSTERECTOMY W/ BILATERAL SALPINGOOPHORECTOMY  2006   VESICOVAGINAL FISTULA CLOSURE W/ TAH     Past Medical History:  Diagnosis Date   Arthritis    pt reports in back & knees   Colon polyps 2010   At [facility]    Diverticulitis 2016   Genital warts    GERD (gastroesophageal reflux disease)    Heart murmur    Hepatitis B    History of blood transfusion    during each major surgery per pt   History of chicken pox    History of hiatal hernia    History of torn meniscus of right knee    HTN (hypertension)    Hx of migraine headaches    Hypertension    Hypopotassemia    Ht 5\' 2"  (1.575 m)    BMI 41.61 kg/m   Opioid Risk Score:   Fall Risk Score:  `1  Depression screen PHQ 2/9  Depression screen [facility] 2/9 07/10/2021 02/27/2021 07/23/2019 05/05/2019 04/17/2019 01/13/2019 09/24/2018  Decreased Interest 0 0 0 1 2 0 0  Down, Depressed, Hopeless 1 1 0 0 3 1 1   PHQ - 2 Score  1 1 0 1 5 1 1   Altered sleeping 0 0 0 0 3 0 0  Tired, decreased energy 0 3 1 3 3 3 3   Change in appetite 0 0 0 0 1 0 0  Feeling bad or failure about yourself  1 0 0 0 0 0 0  Trouble concentrating 0 0 0 0 2 0 0  Moving slowly or fidgety/restless 0 0 0 0 0 0 0  Suicidal thoughts 0 0 0 0 1 0 0  PHQ-9 Score 2 4 1 4 15 4 4   Difficult doing work/chores - - Not difficult at all Not difficult at all Somewhat difficult Not difficult at all Somewhat difficult  Some recent data might be hidden     Review of Systems  Constitutional: Negative.   HENT: Negative.    Eyes: Negative.   Respiratory: Negative.    Cardiovascular: Negative.   Gastrointestinal: Negative.   Endocrine: Negative.   Genitourinary: Negative.   Musculoskeletal:  Positive for back pain and gait problem.  Skin: Negative.   Allergic/Immunologic: Negative.   Hematological: Negative.   Psychiatric/Behavioral: Negative.        Objective:   Physical Exam Vitals and nursing note reviewed.  Constitutional:      Appearance: Normal appearance.  Cardiovascular:     Rate and Rhythm: Normal rate and regular rhythm.     Pulses: Normal pulses.     Heart sounds: Normal heart sounds.  Pulmonary:     Effort: Pulmonary effort is normal.     Breath sounds:  Normal breath sounds.  Musculoskeletal:     Cervical back: Normal range of motion and neck supple.     Comments: Normal Muscle Bulk and Muscle Testing Reveals:  Upper Extremities: Full ROM and Muscle Strength 5/5 Lower Extremities: Right: Decreased ROM and Muscle Strength 4/5 Wearing AFO  Left Lower Extremity: Full ROM and Muscle Strength 5/5 Arrived in wheelchair     Skin:    General: Skin is warm and dry.  Neurological:     Mental Status: She is alert and oriented to person, place, and time.  Psychiatric:        Mood and Affect: Mood normal.        Behavior: Behavior normal.         Assessment & Plan:  1.Intraparenchymal Hemorrhage of Brain: She has a scheduled appointment with Dr. Pearlean Brownie. Continue Home Health Therapy. Continue to Monitor.  2.Lumbar Facet Arthropathy, Lumbar Degenerative Disc Disease: Continue HEP and Continue to Monitor.  3. Essential Hypertension: Continue current medication PCP following. 4. Right Greater Trochanter Bursitis: Continue HEP as tolerated . Continue to Monitor 5.  Acute Pain of Right Knee: Continue HEP as tolerated. Continue to monitor.  6.  Left Posterior tibial DVT/ left peroneal: No anticoagulation due to IPH: Continue to Monitor.   F/U with Dr Wynn Banker in 4- 6 weeks

## 2021-07-12 ENCOUNTER — Encounter: Payer: Self-pay | Admitting: Internal Medicine

## 2021-07-12 ENCOUNTER — Ambulatory Visit (INDEPENDENT_AMBULATORY_CARE_PROVIDER_SITE_OTHER): Payer: Medicare Other | Admitting: Internal Medicine

## 2021-07-12 VITALS — BP 132/82 | HR 81 | Temp 97.9°F | Resp 16 | Ht 62.0 in | Wt 236.1 lb

## 2021-07-12 DIAGNOSIS — E559 Vitamin D deficiency, unspecified: Secondary | ICD-10-CM

## 2021-07-12 DIAGNOSIS — E7800 Pure hypercholesterolemia, unspecified: Secondary | ICD-10-CM

## 2021-07-12 DIAGNOSIS — Z09 Encounter for follow-up examination after completed treatment for conditions other than malignant neoplasm: Secondary | ICD-10-CM

## 2021-07-12 DIAGNOSIS — I10 Essential (primary) hypertension: Secondary | ICD-10-CM

## 2021-07-12 DIAGNOSIS — Z8679 Personal history of other diseases of the circulatory system: Secondary | ICD-10-CM | POA: Diagnosis not present

## 2021-07-12 DIAGNOSIS — K5909 Other constipation: Secondary | ICD-10-CM

## 2021-07-12 DIAGNOSIS — R39198 Other difficulties with micturition: Secondary | ICD-10-CM

## 2021-07-12 NOTE — Assessment & Plan Note (Signed)
Reviewed medications, per Neurology discontinue statin. Modify risk factors. Taking Flexeril and Gabapentin for weakness, doing well in physical therapy. Feels well supported at home.  ?

## 2021-07-12 NOTE — Patient Instructions (Addendum)
It was great seeing you today! ? ?Plan discussed at today's visit: ?-Continue current medications and continue therapy and following with Neurology ?-Urology referral placed ? ?Follow up in: 1 month  ? ?Take care and let us know if you have any questions or concerns prior to your next visit. ? ?Dr. Rosana Berger ? ?

## 2021-07-12 NOTE — Assessment & Plan Note (Signed)
Blood pressure stable, continue Amlodipine 5 mg and checking blood pressure at home, recheck in 1 month. ?

## 2021-07-12 NOTE — Progress Notes (Signed)
Established Patient Office Visit  Subjective:  Patient ID: Terri Werner, female    DOB: 09/28/1950  Age: 71 y.o. MRN: [id number]  CC:  Chief Complaint  Patient presents with   Follow-up    Hospital/rehab f/u for brain hemrroage    HPI Terri Werner presents for hospital follow up.   Discharge Date: 06/27/21 Hospital/facility: [facility] Diagnosis: Intraparenchymal hemorrhage of brain, left posterior tibial DVT, UTI Procedures/tests:   Brain MRI: No mass or infarct seen underlying the left posterior frontal hematoma. There are however signs of innumerable remote lobar microhemorrhages and white matter gliosis, compatible with amyloid angiopathy or other primary vascular disease.  Echo: 1. Left ventricular ejection fraction, by estimation, is 60 to 65%. The  left ventricle has normal function. The left ventricle has no regional  wall motion abnormalities. There is mild concentric left ventricular  hypertrophy. Left ventricular diastolic  parameters are consistent with Grade I diastolic dysfunction (impaired  relaxation).   2. Right ventricular systolic function is normal. The right ventricular  size is normal. There is normal pulmonary artery systolic pressure.   3. The mitral valve is normal in structure. Trivial mitral valve  regurgitation.   4. The aortic valve is tricuspid. There is mild calcification of the  aortic valve. There is mild thickening of the aortic valve. Aortic valve  regurgitation is not visualized. Aortic valve sclerosis/calcification is  present, without any evidence of  aortic stenosis.  Consultants: Neurology  Discontinued medications: Crestor  Status: better  Hypertension: -Medications: Amlodipine 5 mg -Patient is compliant with above medications and reports no side effects. -Checking BP at home (average): 130-150/80 since home  -Denies any SOB, CP, vision changes, LE edema or symptoms of hypotension  HLD/Hx of CVA: -Medications:  Crestor 10 mg - stopped taking after SATURN trial  -Taking Flexeril 5 once a day and Gabapentin 300 TID, doing well  -PT twice a week, lives alone at home but has family support coming weekends and at night -Last lipid panel: Lipid Panel     Component Value Date/Time   CHOL 158 06/01/2021 0510   TRIG 60 06/01/2021 0510   HDL 50 06/01/2021 0510   CHOLHDL 3.2 06/01/2021 0510   VLDL 12 06/01/2021 0510   LDLCALC 96 06/01/2021 0510   LDLCALC 101 (H) 02/27/2021 1040   Constipation: -Taking Senna, had a BM this morning but had been constipated   Abnormal Urinary Flow: -Prior to CVA, had noticed change in urinary flow, like gas or bubbles in urine -Denies dysuria, hematuria, increase urinary frequency or urgency, fevers, abdominal pain - treated for UTI while inpatient  -Does have some incontinence    Past Medical History:  Diagnosis Date   Arthritis    pt reports in back & knees   Colon polyps 2010   At [facility]   Diverticulitis 2016   Genital warts    GERD (gastroesophageal reflux disease)    Heart murmur    Hepatitis B    History of blood transfusion    during each major surgery per pt   History of chicken pox    History of hiatal hernia    History of torn meniscus of right knee    HTN (hypertension)    Hx of migraine headaches    Hypertension    Hypopotassemia     Past Surgical History:  Procedure Laterality Date   ABDOMINAL HYSTERECTOMY  2006   APPENDECTOMY  1980   CESAREAN SECTION  1981   COLONOSCOPY WITH PROPOFOL  N/A 11/01/2014   Procedure: COLONOSCOPY WITH PROPOFOL;  Surgeon: Scot Jun, MD;  Location: [facility] ENDOSCOPY;  Service: Endoscopy;  Laterality: N/A;   COLONOSCOPY WITH PROPOFOL N/A 01/05/2020   Procedure: COLONOSCOPY WITH PROPOFOL;  Surgeon: Regis Bill, MD;  Location: [facility] ENDOSCOPY;  Service: Endoscopy;  Laterality: N/A;   ECTOPIC PREGNANCY SURGERY  1980   HERNIA REPAIR  2006   Umbilical   KNEE ARTHROSCOPY Left 2006   torn meniscus    TONSILLECTOMY  1971   TOTAL ABDOMINAL HYSTERECTOMY W/ BILATERAL SALPINGOOPHORECTOMY  2006   VESICOVAGINAL FISTULA CLOSURE W/ TAH      Family History  Problem Relation Age of Onset   Breast cancer Sister 68   Hypertension Mother    Alzheimer's disease Mother    Dementia Mother    Hypertension Maternal Grandfather    Hyperlipidemia Maternal Grandfather    Birth defects Maternal Grandmother        Bladder   Colon cancer Maternal Grandmother    Congestive Heart Failure Father    Hyperlipidemia Father    Dementia Paternal Grandmother    Dementia Paternal Grandfather    Thyroid disease Sister     Social History   Socioeconomic History   Marital status: Widowed    Spouse name: Not on file   Number of children: 2   Years of education: Not on file   Highest education level: Master's degree (e.g., MA, MS, MEng, MEd, MSW, MBA)  Occupational History   Occupation: [employer] - [place] [employer]    Comment: Full Time    Employer: [employer]  Tobacco Use   Smoking status: Never   Smokeless tobacco: Never  Vaping Use   Vaping Use: Never used  Substance and Sexual Activity   Alcohol use: Yes    Comment: rare   Drug use: No   Sexual activity: Not Currently    Partners: Male  Other Topics Concern   Not on file  Social History Narrative   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      Regular Exercise -  NO   Daily Caffeine Use:  1 soda/tea          Social Determinants of Health   Financial Resource Strain: Not on file  Food Insecurity: Not on file  Transportation Needs: Not on file  Physical Activity: Not on file  Stress: Not on file  Social Connections: Not on file  Intimate Partner Violence: Not on file    Outpatient Medications Prior to Visit  Medication Sig Dispense Refill   acetaminophen (TYLENOL) 325 MG tablet Take 2 tablets (650 mg total) by mouth  every 4 (four) hours as needed for mild pain (or temp > 37.5 C (99.5 F)).     acidophilus (RISAQUAD) CAPS capsule Take 1 capsule by mouth daily. 30 capsule 0   amLODipine (NORVASC) 5 MG tablet Take 1 tablet (5 mg total) by mouth daily. 30 tablet 0   Cholecalciferol (VITAMIN D) 50 MCG (2000 UT) tablet Take 1 tablet (2,000 Units total) by mouth daily. 30 tablet 0   cyclobenzaprine (FLEXERIL) 5 MG tablet Take 1 tablet (5 mg total) by mouth 3 (three) times daily as needed for muscle spasms. 30 tablet 0   diclofenac Sodium (VOLTAREN) 1 % GEL Apply 4 g topically 4 (four) times daily. 400 g 0   gabapentin (NEURONTIN) 300 MG capsule Take 1 capsule (  300 mg total) by mouth 3 (three) times daily. 90 capsule 0   lidocaine (LIDODERM) 5 % Place 3 patches onto the skin daily. Remove & Discard patch within 12 hours or as directed by MD 60 patch 0   magnesium oxide (MAG-OX) 400 MG tablet Take 0.5 tablets (200 mg total) by mouth at bedtime. 15 tablet 0   pantoprazole (PROTONIX) 40 MG tablet Take 1 tablet (40 mg total) by mouth at bedtime. 30 tablet 0   senna-docusate (SENOKOT-S) 8.6-50 MG tablet Take 1 tablet by mouth 2 (two) times daily.     No facility-administered medications prior to visit.    Allergies  Allergen Reactions   Shrimp [Shellfish Allergy] Swelling    Lips will burn    Latex Itching    Burning    ROS Review of Systems  Constitutional:  Negative for chills and fever.  Genitourinary:  Positive for difficulty urinating. Negative for dysuria, frequency, hematuria and urgency.  Neurological:  Positive for weakness.     Objective:    Physical Exam Constitutional:      Appearance: Normal appearance.     Comments: In wheelchair  HENT:     Head: Normocephalic and atraumatic.  Eyes:     Conjunctiva/sclera: Conjunctivae normal.  Cardiovascular:     Rate and Rhythm: Normal rate and regular rhythm.  Pulmonary:     Effort: Pulmonary effort is normal.     Breath sounds: Normal breath  sounds.  Skin:    General: Skin is warm and dry.  Neurological:     Mental Status: She is alert.  Psychiatric:        Mood and Affect: Mood normal.        Behavior: Behavior normal.    BP 132/82    Pulse 81    Temp 97.9 F (36.6 C)    Resp 16    Ht 5\' 2"  (1.575 m)    Wt 236 lb 1.6 oz (107.1 kg)    SpO2 98%    BMI 43.18 kg/m  Wt Readings from Last 3 Encounters:  06/07/21 227 lb 8.2 oz (103.2 kg)  05/27/21 229 lb 15 oz (104.3 kg)  02/27/21 230 lb (104.3 kg)     Health Maintenance Due  Topic Date Due   Zoster Vaccines- Shingrix (1 of 2) Never done   COVID-19 Vaccine (4 - Booster for Moderna series) 05/24/2020   INFLUENZA VACCINE  12/12/2020    There are no preventive care reminders to display for this patient.  Lab Results  Component Value Date   TSH 1.80 02/27/2021   Lab Results  Component Value Date   WBC 7.0 06/06/2021   HGB 12.6 06/06/2021   HCT 38.1 06/06/2021   MCV 88.0 06/06/2021   PLT 180 06/06/2021   Lab Results  Component Value Date   NA 139 06/06/2021   K 4.2 06/06/2021   CO2 28 06/06/2021   GLUCOSE 144 (H) 06/06/2021   BUN 14 06/06/2021   CREATININE 1.07 (H) 06/06/2021   BILITOT 0.5 06/01/2021   ALKPHOS 40 06/01/2021   AST 16 06/01/2021   ALT 13 06/01/2021   PROT 6.7 06/01/2021   ALBUMIN 3.2 (L) 06/01/2021   CALCIUM 9.0 06/06/2021   ANIONGAP 6 06/06/2021   EGFR 69 02/27/2021   GFR 82.53 07/05/2014   Lab Results  Component Value Date   CHOL 158 06/01/2021   Lab Results  Component Value Date   HDL 50 06/01/2021   Lab Results  Component Value Date  LDLCALC 96 06/01/2021   Lab Results  Component Value Date   TRIG 60 06/01/2021   Lab Results  Component Value Date   CHOLHDL 3.2 06/01/2021   Lab Results  Component Value Date   HGBA1C 5.9 (A) 02/27/2021      Assessment & Plan:   Problem List Items Addressed This Visit       Cardiovascular and Mediastinum   Hypertension, benign    Blood pressure stable, continue Amlodipine  5 mg and checking blood pressure at home, recheck in 1 month.        Other   Hyperlipidemia   Hx of spontaneous intraparenchymal intracranial hemorrhage - Primary    Reviewed medications, per Neurology discontinue statin. Modify risk factors. Taking Flexeril and Gabapentin for weakness, doing well in physical therapy. Feels well supported at home.       Other Visit Diagnoses     Hospital discharge follow-up       Abnormal urinary stream    - referral placed for urodynamic testing.    Relevant Orders   Ambulatory referral to Urology        Follow-up: Return in about 4 weeks (around 08/09/2021).    Margarita Mail, DO

## 2021-07-13 ENCOUNTER — Telehealth: Payer: Self-pay | Admitting: Family Medicine

## 2021-07-13 NOTE — Telephone Encounter (Signed)
Aldona Bar PT with [facility] ([facility]) ?Best contact: [phone] ? ?She called to report that the patient has experienced elevated BP during the last visits. Readings include:  ?144/94 -today - no symptoms reported with any of these readings. Just elevated and Samantha wanted PCP to be aware.  ?152/88  ?142/90  ?

## 2021-07-17 ENCOUNTER — Encounter: Payer: Self-pay | Admitting: Internal Medicine

## 2021-07-18 MED ORDER — VITAMIN D 50 MCG (2000 UT) PO TABS: 2000.0000 [IU] | ORAL_TABLET | Freq: Every day | ORAL | 3 refills | Status: DC

## 2021-07-18 MED ORDER — GABAPENTIN 300 MG PO CAPS: 300.0000 mg | ORAL_CAPSULE | Freq: Three times a day (TID) | ORAL | 3 refills | Status: DC

## 2021-07-18 MED ORDER — AMLODIPINE BESYLATE 5 MG PO TABS: 5.0000 mg | ORAL_TABLET | Freq: Every day | ORAL | 3 refills | Status: DC

## 2021-07-18 MED ORDER — SENNOSIDES-DOCUSATE SODIUM 8.6-50 MG PO TABS: 1.0000 | ORAL_TABLET | Freq: Two times a day (BID) | ORAL | 3 refills | Status: DC

## 2021-07-18 MED ORDER — CYCLOBENZAPRINE HCL 5 MG PO TABS: 5.0000 mg | ORAL_TABLET | Freq: Three times a day (TID) | ORAL | 0 refills | Status: DC | PRN

## 2021-07-18 NOTE — Addendum Note (Signed)
Addended by: Teodora Medici on: 07/18/2021 09:31 AM ? ? Modules accepted: Orders ? ?

## 2021-07-20 ENCOUNTER — Ambulatory Visit: Payer: Self-pay | Admitting: *Deleted

## 2021-07-20 NOTE — Telephone Encounter (Signed)
?  Chief Complaint: patient's daughter reports elevated BP wants a call from PCP and medications refilled ?Symptoms: headache mild. Reports dizziness yesterday not today. BP 164/68 and BP 141/82 HR 83. Hx stroke  ?Frequency: na ?Pertinent Negatives: Patient denies chest pain , difficulty breathing, no fever, no other neurological deficits noted new  ?Disposition: '[]'$ ED /'[]'$ Urgent Care (no appt availability in office) / '[x]'$ Appointment(In office/virtual)/ '[]'$  [facility]/ '[]'$ Home Care/ '[]'$ Refused Recommended Disposition /'[]'$ [facility]/ '[]'$  Follow-up with PCP ?Additional Notes:  ? ?Patient 's daughter wanted appt earlier and medication refilled. Note from 07/17/21 reports refills sent to pharmacy. Patient's daughter feels BP issues have not been addressed as a priority. Reviewed protocol for response time for PCP and if symptoms noted NT would advise. Daughter ,  Velna Hatchet requesting a call back from PCP if possible. ? ? Reason for Disposition ? [1] Taking BP medications AND [2] feels is having side effects (e.g., impotence, cough, dizzy upon standing) ? ?Answer Assessment - Initial Assessment Questions ?1. BLOOD PRESSURE: "What is the blood pressure?" "Did you take at least two measurements 5 minutes apart?" ?    3:06 pm BP 164/68 and rechecked for BP 141/82 HR 83 ?2. ONSET: "When did you take your blood pressure?" ?    Now  ?3. HOW: "How did you obtain the blood pressure?" (e.g., visiting nurse, automatic home BP monitor) ?    Automatic home BP monitor  ?4. HISTORY: "Do you have a history of high blood pressure?" ?    Yes  ?5. MEDICATIONS: "Are you taking any medications for blood pressure?" "Have you missed any doses recently?" ?    Yes ,not missed any does  ?6. OTHER SYMPTOMS: "Do you have any symptoms?" (e.g., headache, chest pain, blurred vision, difficulty breathing, weakness) ?    Headache , dizziness last not today .  ?7. PREGNANCY: "Is there any chance you are pregnant?" "When was your last  menstrual period?" ?    na ? ?Protocols used: Blood Pressure - High-A-AH ? ?

## 2021-07-21 ENCOUNTER — Telehealth (INDEPENDENT_AMBULATORY_CARE_PROVIDER_SITE_OTHER): Payer: Medicare Other | Admitting: Internal Medicine

## 2021-07-21 ENCOUNTER — Encounter: Payer: Self-pay | Admitting: Internal Medicine

## 2021-07-21 VITALS — BP 167/99 | HR 81

## 2021-07-21 DIAGNOSIS — I10 Essential (primary) hypertension: Secondary | ICD-10-CM | POA: Diagnosis not present

## 2021-07-21 MED ORDER — VALSARTAN-HYDROCHLOROTHIAZIDE 80-12.5 MG PO TABS: 1.0000 | ORAL_TABLET | Freq: Every day | ORAL | 3 refills | Status: DC

## 2021-07-21 NOTE — Telephone Encounter (Signed)
Pt scheduled virtual ?

## 2021-07-21 NOTE — Progress Notes (Signed)
Virtual Visit via Video Note ? ?I connected with Terri Werner on 07/21/21 at  3:20 PM EST by a video enabled telemedicine application and verified that I am speaking with the correct person using two identifiers. ? ?Location: ?Patient: Home ?Provider: [facility] ?  ?I discussed the limitations of evaluation and management by telemedicine and the availability of in person appointments. The patient expressed understanding and agreed to proceed. ? ?History of Present Illness: ? ?Terri Werner is a 71 year old female presenting via telemedicine for blood pressure concerns. She was recently in the hospital after having a brain bleed, DVT and UTI.  ? ?Hypertension: ?-Medications: Amlodipine 5 ?-Had been on Valsartan-HCTZ in 2021 but discontinued due to being well controlled  ?-Patient is compliant with above medications and reports no side effects. ?-Checking BP at home (average): 160-170/90-101 over the last week - PT checking BP at home  ?-Denies any SOB, CP, vision changes, LE edema or symptoms of hypotension. Headache last night but this has resolved.  ?  ?Observations/Objective: ? ?General: well appearing, no acute distress ?ENT: conjunctiva normal appearing bilaterally  ?Skin: no rashes, cyanosis or abnormal bruising noted ?Neuro: answers all questions appropriately  ? ?Assessment and Plan: ? ?1. Hypertension, benign: Continue Amlodipine 5 mg daily, added low dose Valsartan-HCTZ today. Continue to check blood pressure at home. Recheck kidney function at follow up in 1 month.  ? ?- valsartan-hydrochlorothiazide (DIOVAN-HCT) 80-12.5 MG tablet; Take 1 tablet by mouth daily.  Dispense: 30 tablet; Refill: 3 ? ?Follow Up Instructions: 1 month  ? ?  ?I discussed the assessment and treatment plan with the patient. The patient was provided an opportunity to ask questions and all were answered. The patient agreed with the plan and demonstrated an understanding of the instructions. ?  ?The patient was advised  to call back or seek an in-person evaluation if the symptoms worsen or if the condition fails to improve as anticipated. ? ?I provided 12 minutes of non-face-to-face time during this encounter. ? ? ?Teodora Medici, DO ? ?

## 2021-07-25 ENCOUNTER — Ambulatory Visit: Payer: Medicare Other | Admitting: Internal Medicine

## 2021-07-28 DIAGNOSIS — I82442 Acute embolism and thrombosis of left tibial vein: Secondary | ICD-10-CM | POA: Diagnosis not present

## 2021-07-28 DIAGNOSIS — M48061 Spinal stenosis, lumbar region without neurogenic claudication: Secondary | ICD-10-CM | POA: Diagnosis not present

## 2021-07-28 DIAGNOSIS — E785 Hyperlipidemia, unspecified: Secondary | ICD-10-CM | POA: Diagnosis not present

## 2021-07-28 DIAGNOSIS — G8929 Other chronic pain: Secondary | ICD-10-CM | POA: Diagnosis not present

## 2021-07-28 DIAGNOSIS — G43909 Migraine, unspecified, not intractable, without status migrainosus: Secondary | ICD-10-CM | POA: Diagnosis not present

## 2021-07-28 DIAGNOSIS — K5900 Constipation, unspecified: Secondary | ICD-10-CM | POA: Diagnosis not present

## 2021-07-28 DIAGNOSIS — K5730 Diverticulosis of large intestine without perforation or abscess without bleeding: Secondary | ICD-10-CM | POA: Diagnosis not present

## 2021-07-28 DIAGNOSIS — K219 Gastro-esophageal reflux disease without esophagitis: Secondary | ICD-10-CM | POA: Diagnosis not present

## 2021-07-28 DIAGNOSIS — I69151 Hemiplegia and hemiparesis following nontraumatic intracerebral hemorrhage affecting right dominant side: Secondary | ICD-10-CM | POA: Diagnosis not present

## 2021-07-28 DIAGNOSIS — I82452 Acute embolism and thrombosis of left peroneal vein: Secondary | ICD-10-CM | POA: Diagnosis not present

## 2021-07-28 DIAGNOSIS — I10 Essential (primary) hypertension: Secondary | ICD-10-CM | POA: Diagnosis not present

## 2021-07-28 DIAGNOSIS — M47816 Spondylosis without myelopathy or radiculopathy, lumbar region: Secondary | ICD-10-CM | POA: Diagnosis not present

## 2021-07-28 DIAGNOSIS — Z9181 History of falling: Secondary | ICD-10-CM | POA: Diagnosis not present

## 2021-07-28 DIAGNOSIS — E669 Obesity, unspecified: Secondary | ICD-10-CM | POA: Diagnosis not present

## 2021-07-28 DIAGNOSIS — M170 Bilateral primary osteoarthritis of knee: Secondary | ICD-10-CM | POA: Diagnosis not present

## 2021-07-28 DIAGNOSIS — M21371 Foot drop, right foot: Secondary | ICD-10-CM | POA: Diagnosis not present

## 2021-08-09 ENCOUNTER — Ambulatory Visit: Payer: Medicare Other | Admitting: Family Medicine

## 2021-08-14 ENCOUNTER — Other Ambulatory Visit: Payer: Self-pay | Admitting: Internal Medicine

## 2021-08-14 DIAGNOSIS — Z8679 Personal history of other diseases of the circulatory system: Secondary | ICD-10-CM

## 2021-08-14 MED ORDER — CYCLOBENZAPRINE HCL 5 MG PO TABS: 5.0000 mg | ORAL_TABLET | Freq: Three times a day (TID) | ORAL | 0 refills | Status: DC | PRN

## 2021-08-18 ENCOUNTER — Ambulatory Visit (INDEPENDENT_AMBULATORY_CARE_PROVIDER_SITE_OTHER): Payer: Medicare Other | Admitting: Internal Medicine

## 2021-08-18 ENCOUNTER — Encounter: Payer: Self-pay | Admitting: Internal Medicine

## 2021-08-18 VITALS — BP 135/78 | HR 84 | Resp 16 | Ht 62.0 in | Wt 240.3 lb

## 2021-08-18 DIAGNOSIS — G8929 Other chronic pain: Secondary | ICD-10-CM | POA: Diagnosis not present

## 2021-08-18 DIAGNOSIS — I10 Essential (primary) hypertension: Secondary | ICD-10-CM | POA: Diagnosis not present

## 2021-08-18 DIAGNOSIS — M5441 Lumbago with sciatica, right side: Secondary | ICD-10-CM

## 2021-08-18 MED ORDER — LIDOCAINE 5 % EX PTCH: 3.0000 | MEDICATED_PATCH | CUTANEOUS | 0 refills | Status: DC

## 2021-08-18 NOTE — Patient Instructions (Signed)
It was great seeing you today! ? ?Plan discussed at today's visit: ?-Keep blood pressure medications the same and continue to check blood pressure at home and write them down ?-Lidocaine patches sent to pharmacy for back  ? ?Follow up in: 1 month  ? ?Take care and let us know if you have any questions or concerns prior to your next visit. ? ?Dr. Rosana Berger ? ?

## 2021-08-18 NOTE — Assessment & Plan Note (Signed)
Doing well on current regimen of Amlodipine 5 mg, Valsartan-HCTZ 80-12.5. Blood pressure here at first 142/82, then 135/78 on recheck. Blood pressure borderline but she did have a few lower readings at home. No changes made to medications today, continue to check at home and will return in 4 weeks for another blood pressure check. Prefer to keep blood pressure <135/85 due to history of CVA. Discussed decreasing sodium in diet and trying to stay as active as possible to also help keep blood pressure down.  ?

## 2021-08-18 NOTE — Progress Notes (Signed)
? ?Established Patient Office Visit ? ?Subjective:  ?Patient ID: Terri Werner, female    DOB: [birth year]  Age: 71 y.o. MRN: [id number] ? ?CC:  ?Chief Complaint  ?Patient presents with  ? Follow-up  ? Hypertension  ? ? ?HPI ?Terri Werner presents for follow up  on blood pressure.  135/75 ? ?Hypertension: ?-Medications: Amlodipine 5 mg, Valsartan-HCTZ 80-12.5 added back at LOV about 3 weeks  ?-Patient is compliant with above medications and reports no side effects. ?-Checking BP at home (average): 125-145/75-91. Lowest 105/71, highest 156/91. ?-Denies any SOB, CP, vision changes, LE edema or symptoms of hypotension. Did have a headache last night but resolved with tylenol and blood pressure was 125/91 at that time. Is working with PT who will check blood pressure at home, almost ready for outpatient PT ? ? ?Past Medical History:  ?Diagnosis Date  ? Arthritis   ? pt reports in back & knees  ? Colon polyps 2010  ? At [facility]  ? Diverticulitis 2016  ? Genital warts   ? GERD (gastroesophageal reflux disease)   ? Heart murmur   ? Hepatitis B   ? History of blood transfusion   ? during each major surgery per pt  ? History of chicken pox   ? History of hiatal hernia   ? History of torn meniscus of right knee   ? HTN (hypertension)   ? Hx of migraine headaches   ? Hypertension   ? Hypopotassemia   ? ? ?Past Surgical History:  ?Procedure Laterality Date  ? ABDOMINAL HYSTERECTOMY  2006  ? APPENDECTOMY  1980  ? CESAREAN SECTION  1981  ? COLONOSCOPY WITH PROPOFOL N/A 11/01/2014  ? Procedure: COLONOSCOPY WITH PROPOFOL;  Surgeon: Scot Jun, MD;  Location: [facility] ENDOSCOPY;  Service: Endoscopy;  Laterality: N/A;  ? COLONOSCOPY WITH PROPOFOL N/A 01/05/2020  ? Procedure: COLONOSCOPY WITH PROPOFOL;  Surgeon: Regis Bill, MD;  Location: [facility] ENDOSCOPY;  Service: Endoscopy;  Laterality: N/A;  ? ECTOPIC PREGNANCY SURGERY  1980  ? HERNIA REPAIR  2006  ? Umbilical  ? KNEE ARTHROSCOPY Left 2006  ? torn  meniscus  ? TONSILLECTOMY  1971  ? TOTAL ABDOMINAL HYSTERECTOMY W/ BILATERAL SALPINGOOPHORECTOMY  2006  ? VESICOVAGINAL FISTULA CLOSURE W/ TAH    ? ? ?Family History  ?Problem Relation Age of Onset  ? Breast cancer Sister 42  ? Hypertension Mother   ? Alzheimer's disease Mother   ? Dementia Mother   ? Hypertension Maternal Grandfather   ? Hyperlipidemia Maternal Grandfather   ? Birth defects Maternal Grandmother   ?     Bladder  ? Colon cancer Maternal Grandmother   ? Congestive Heart Failure Father   ? Hyperlipidemia Father   ? Dementia Paternal Grandmother   ? Dementia Paternal Grandfather   ? Thyroid disease Sister   ? ? ?Social History  ? ?Socioeconomic History  ? Marital status: Widowed  ?  Spouse name: Not on file  ? Number of children: 2  ? Years of education: Not on file  ? Highest education level: Master's degree (e.g., MA, MS, MEng, MEd, MSW, MBA)  ?Occupational History  ? Occupation: [employer]  ?  Comment: Full Time  ?  Employer: [employer]  ?Tobacco Use  ? Smoking status: Never  ? Smokeless tobacco: Never  ?Vaping Use  ? Vaping Use: Never used  ?Substance and Sexual Activity  ? Alcohol use: Yes  ?  Comment: rare  ?  Drug use: No  ? Sexual activity: Not Currently  ?  Partners: Male  ?Other Topics Concern  ? Not on file  ?Social History Narrative  ? Works for [employer] and helps her 3 grandchildren she helps with since her daughter just recently became employed again after surgery.  ?   ? Regular Exercise -  NO  ? Daily Caffeine Use:  1 soda/tea   ?   ?   ? ?Social Determinants of Health  ? ?Financial Resource Strain: Not on file  ?Food Insecurity: Not on file  ?Transportation Needs: Not on file  ?Physical Activity: Not on file  ?Stress: Not on file  ?Social Connections: Not on file  ?Intimate Partner Violence: Not on file  ? ? ?Outpatient Medications Prior to Visit  ?Medication Sig Dispense Refill  ? acetaminophen (TYLENOL) 325 MG tablet Take 2 tablets (650 mg  total) by mouth every 4 (four) hours as needed for mild pain (or temp > 37.5 C (99.5 F)).    ? acidophilus (RISAQUAD) CAPS capsule Take 1 capsule by mouth daily. 30 capsule 0  ? amLODipine (NORVASC) 5 MG tablet Take 1 tablet (5 mg total) by mouth daily. 90 tablet 3  ? Cholecalciferol (VITAMIN D) 50 MCG (2000 UT) tablet Take 1 tablet (2,000 Units total) by mouth daily. 90 tablet 3  ? cyclobenzaprine (FLEXERIL) 5 MG tablet Take 1 tablet (5 mg total) by mouth 3 (three) times daily as needed for muscle spasms. 30 tablet 0  ? diclofenac Sodium (VOLTAREN) 1 % GEL Apply 4 g topically 4 (four) times daily. 400 g 0  ? gabapentin (NEURONTIN) 300 MG capsule Take 1 capsule (300 mg total) by mouth 3 (three) times daily. 90 capsule 3  ? lidocaine (LIDODERM) 5 % Place 3 patches onto the skin daily. Remove & Discard patch within 12 hours or as directed by MD 60 patch 0  ? magnesium oxide (MAG-OX) 400 MG tablet Take 0.5 tablets (200 mg total) by mouth at bedtime. 15 tablet 0  ? pantoprazole (PROTONIX) 40 MG tablet Take 1 tablet (40 mg total) by mouth at bedtime. 30 tablet 0  ? senna-docusate (SENOKOT-S) 8.6-50 MG tablet Take 1 tablet by mouth 2 (two) times daily. 90 tablet 3  ? valsartan-hydrochlorothiazide (DIOVAN-HCT) 80-12.5 MG tablet Take 1 tablet by mouth daily. 30 tablet 3  ? ?No facility-administered medications prior to visit.  ? ? ?Allergies  ?Allergen Reactions  ? Shrimp [Shellfish Allergy] Swelling  ?  Lips will burn ?  ? Latex Itching  ?  Burning  ? ? ?ROS ?Review of Systems  ?Constitutional:  Negative for chills and fever.  ?Eyes:  Negative for visual disturbance.  ?Respiratory:  Negative for cough and shortness of breath.   ?Cardiovascular:  Negative for chest pain and palpitations.  ?Neurological:  Positive for weakness. Negative for dizziness and headaches.  ? ?  ?Objective:  ?  ?Physical Exam ?Constitutional:   ?   Appearance: Normal appearance.  ?   Comments: In wheelchair  ?HENT:  ?   Head: Normocephalic and  atraumatic.  ?Eyes:  ?   Conjunctiva/sclera: Conjunctivae normal.  ?Cardiovascular:  ?   Rate and Rhythm: Normal rate and regular rhythm.  ?Pulmonary:  ?   Effort: Pulmonary effort is normal.  ?   Breath sounds: Normal breath sounds.  ?Skin: ?   General: Skin is warm and dry.  ?Neurological:  ?   Mental Status: She is alert.  ?Psychiatric:     ?  Mood and Affect: Mood normal.     ?   Behavior: Behavior normal.  ? ? ?BP 135/78   Pulse 84   Resp 16   Ht 5\' 2"  (1.575 m)   Wt 240 lb 4.8 oz (109 kg)   SpO2 97%   BMI 43.95 kg/m?  ?Wt Readings from Last 3 Encounters:  ?08/18/21 240 lb 4.8 oz (109 kg)  ?07/12/21 236 lb 1.6 oz (107.1 kg)  ?06/07/21 227 lb 8.2 oz (103.2 kg)  ? ?Vitals:  ? 08/18/21 1058 08/18/21 1126  ?BP: (!) 142/84 135/78  ? ? ? ?Health Maintenance Due  ?Topic Date Due  ? Zoster Vaccines- Shingrix (1 of 2) Never done  ? COVID-19 Vaccine (1) 03/29/2020  ? ? ?There are no preventive care reminders to display for this patient. ? ?Lab Results  ?Component Value Date  ? TSH 1.80 02/27/2021  ? ?Lab Results  ?Component Value Date  ? WBC 7.0 06/06/2021  ? HGB 12.6 06/06/2021  ? HCT 38.1 06/06/2021  ? MCV 88.0 06/06/2021  ? PLT 180 06/06/2021  ? ?Lab Results  ?Component Value Date  ? NA 139 06/06/2021  ? K 4.2 06/06/2021  ? CO2 28 06/06/2021  ? GLUCOSE 144 (H) 06/06/2021  ? BUN 14 06/06/2021  ? CREATININE 1.07 (H) 06/06/2021  ? BILITOT 0.5 06/01/2021  ? ALKPHOS 40 06/01/2021  ? AST 16 06/01/2021  ? ALT 13 06/01/2021  ? PROT 6.7 06/01/2021  ? ALBUMIN 3.2 (L) 06/01/2021  ? CALCIUM 9.0 06/06/2021  ? ANIONGAP 6 06/06/2021  ? EGFR 69 02/27/2021  ? GFR 82.53 07/05/2014  ? ?Lab Results  ?Component Value Date  ? CHOL 158 06/01/2021  ? ?Lab Results  ?Component Value Date  ? HDL 50 06/01/2021  ? ?Lab Results  ?Component Value Date  ? LDLCALC 96 06/01/2021  ? ?Lab Results  ?Component Value Date  ? TRIG 60 06/01/2021  ? ?Lab Results  ?Component Value Date  ? CHOLHDL 3.2 06/01/2021  ? ?Lab Results  ?Component Value Date  ?  HGBA1C 5.9 (A) 02/27/2021  ? ? ?  ?Assessment & Plan:  ? ?Problem List Items Addressed This Visit   ? ?  ? Cardiovascular and Mediastinum  ? Hypertension, benign - Primary  ?  Doing well on current regimen of Amlodipine

## 2021-08-22 ENCOUNTER — Telehealth: Payer: Self-pay

## 2021-08-22 NOTE — Telephone Encounter (Signed)
Copied from [place] [id number]. Topic: General - Other ?>> Aug 22, 2021  3:34 PM Leward Quan A wrote: ?Reason for CRM: Pam with [facility] [employer] called in to inform Ms Rosana Berger that patient prior Josem Kaufmann was denied for lidocaine (LIDODERM) 5 %.. Any questions or concerns please call  Ph# [phone] opt# 5 ?

## 2021-08-24 ENCOUNTER — Encounter: Payer: Medicare Other | Attending: Registered Nurse | Admitting: Physical Medicine & Rehabilitation

## 2021-08-24 ENCOUNTER — Encounter: Payer: Self-pay | Admitting: Physical Medicine & Rehabilitation

## 2021-08-24 VITALS — BP 139/84 | HR 74 | Ht 62.0 in | Wt 237.6 lb

## 2021-08-24 DIAGNOSIS — G8111 Spastic hemiplegia affecting right dominant side: Secondary | ICD-10-CM | POA: Diagnosis not present

## 2021-08-24 NOTE — Progress Notes (Signed)
? ?Subjective:  ? ? Patient ID: Terri Werner, female    DOB: [birth year], 72 y.o.   MRN: [id number] ?71 y.o. right-handed female with history of hepatitis B hyperlipidemia hypertension but ran out of her medications and did not seek a refill.  Per chart review lives alone.  Recently retired.  She has a daughter and niece in the area.  Presented 05/27/2021 with sudden onset of right leg weakness and mild headache.  Cranial CT scan showed acute parenchymal hemorrhage in the parasagittal left frontal parietal lobes with mild edema.  No substantial mass effect.  CT angiogram head and neck no abnormal vascularity in the region of hemorrhage no hemodynamically significant stenosis.  MRI follow-up showed no mass effect seen underlying the left posterior frontal hematoma.  Admission chemistries unremarkable urine drug screen negative urine culture greater 100,000 gram-negative rods.  Patient was placed on Keflex x5 days for UTI.  EEG negative for seizure.  Echocardiogram with ejection fraction of 60 to 65% no wall motion abnormalities grade 1 diastolic dysfunction.  Neurology follow-up IPH likely caused by hypertension monitored closely initially maintained on Cleviprex.   ?He/She  has had improvement in activity tolerance, balance, postural control as well as ability to compensate for deficits. He/She has had improvement in functional use RUE/LUE  and RLE/LLE as well as improvement in awareness.  Sit to stand wheelchair rolling walker minimal assist for balance.  Perform car transfers x2 with therapist and family with assistance.  Ambulates 88 feet rolling walker right AFO with min mod assist.  Contact-guard lower body bathing set up upper body bathing moderate assist lower body dressing set up upper body dressing.  Full family teaching completed plan discharged to home   ?Admit date: 05/31/2021 ?Discharge date: 06/27/2020 ?HPI ?Receiving HH ? ?Needs help with LE dressing, able to do UE dressing ?Walking with RW ,  occ WC for long distance, using quad cane at times mainly with PT ?BPs were running high but PCP started Valsartan /HCTZ ?Pain Inventory ?Average Pain 6 ?Pain Right Now 6 ?My pain is sharp and aching ? ?LOCATION OF PAIN  Back, Hip ? ?BOWEL ?Number of stools per week: 2-3 ?Oral laxative use Yes  ?Type of laxative stool softener ? ? ?BLADDER ?Normal and Pads ? ? ? ?Mobility ?walk with assistance ?use a cane ?use a walker ?ability to climb steps?  no ?do you drive?  no ?use a wheelchair ? ?Function ?retired ?I need assistance with the following:  dressing, bathing, meal prep, household duties, and shopping ? ?Neuro/Psych ?numbness ?trouble walking ?spasms ? ?Prior Studies ?Any changes since last visit?  no ? ?Physicians involved in your care ?Any changes since last visit?  no ? ? ?Family History  ?Problem Relation Age of Onset  ? Breast cancer Sister 29  ? Hypertension Mother   ? Alzheimer's disease Mother   ? Dementia Mother   ? Hypertension Maternal Grandfather   ? Hyperlipidemia Maternal Grandfather   ? Birth defects Maternal Grandmother   ?     Bladder  ? Colon cancer Maternal Grandmother   ? Congestive Heart Failure Father   ? Hyperlipidemia Father   ? Dementia Paternal Grandmother   ? Dementia Paternal Grandfather   ? Thyroid disease Sister   ? ?Social History  ? ?Socioeconomic History  ? Marital status: Widowed  ?  Spouse name: Not on file  ? Number of children: 2  ? Years of education: Not on file  ? Highest education level: Master's degree (e.g.,  MA, MS, MEng, MEd, MSW, [employer])  ?Occupational History  ? Occupation: [employer]  ?  Comment: Full Time  ?  Employer: [employer]  ?Tobacco Use  ? Smoking status: Never  ? Smokeless tobacco: Never  ?Vaping Use  ? Vaping Use: Never used  ?Substance and Sexual Activity  ? Alcohol use: Yes  ?  Comment: rare  ? Drug use: No  ? Sexual activity: Not Currently  ?  Partners: Male  ?Other Topics Concern  ? Not on file  ?Social History  Narrative  ? Works for [employer] and helps her 3 grandchildren she helps with since her daughter just recently became employed again after surgery.  ?   ? Regular Exercise -  NO  ? Daily Caffeine Use:  1 soda/tea   ?   ?   ? ?Social Determinants of Health  ? ?Financial Resource Strain: Not on file  ?Food Insecurity: Not on file  ?Transportation Needs: Not on file  ?Physical Activity: Not on file  ?Stress: Not on file  ?Social Connections: Not on file  ? ?Past Surgical History:  ?Procedure Laterality Date  ? ABDOMINAL HYSTERECTOMY  2006  ? APPENDECTOMY  1980  ? CESAREAN SECTION  1981  ? COLONOSCOPY WITH PROPOFOL N/A 11/01/2014  ? Procedure: COLONOSCOPY WITH PROPOFOL;  Surgeon: Scot Jun, MD;  Location: [facility] ENDOSCOPY;  Service: Endoscopy;  Laterality: N/A;  ? COLONOSCOPY WITH PROPOFOL N/A 01/05/2020  ? Procedure: COLONOSCOPY WITH PROPOFOL;  Surgeon: Regis Bill, MD;  Location: [facility] ENDOSCOPY;  Service: Endoscopy;  Laterality: N/A;  ? ECTOPIC PREGNANCY SURGERY  1980  ? HERNIA REPAIR  2006  ? Umbilical  ? KNEE ARTHROSCOPY Left 2006  ? torn meniscus  ? TONSILLECTOMY  1971  ? TOTAL ABDOMINAL HYSTERECTOMY W/ BILATERAL SALPINGOOPHORECTOMY  2006  ? VESICOVAGINAL FISTULA CLOSURE W/ TAH    ? ?Past Medical History:  ?Diagnosis Date  ? Arthritis   ? pt reports in back & knees  ? Colon polyps 2010  ? At [facility]  ? Diverticulitis 2016  ? Genital warts   ? GERD (gastroesophageal reflux disease)   ? Heart murmur   ? Hepatitis B   ? History of blood transfusion   ? during each major surgery per pt  ? History of chicken pox   ? History of hiatal hernia   ? History of torn meniscus of right knee   ? HTN (hypertension)   ? Hx of migraine headaches   ? Hypertension   ? Hypopotassemia   ? ?BP 139/84   Pulse 74   Ht 5\' 2"  (1.575 m)   Wt 237 lb 9.6 oz (107.8 kg)   SpO2 98%   BMI 43.46 kg/m?  ? ?Opioid Risk Score:   ?Fall Risk Score:  `1 ? ?Depression screen PHQ 2/9 ? ? ?  08/24/2021  ? 10:48 AM 08/18/2021  ? 11:05 AM 07/21/2021  ?   2:33 PM 07/12/2021  ? 11:09 AM 07/10/2021  ?  1:13 PM 02/27/2021  ?  9:48 AM 07/23/2019  ? 10:23 AM  ?Depression screen PHQ 2/9  ?Decreased Interest 0 0 0 1 0 0 0  ?Down, Depressed, Hopeless 0 0 0 1 1 1  0  ?PHQ - 2 Score 0 0 0 2 1 1  0  ?Altered sleeping  0 0 1 0 0 0  ?Tired, decreased energy  0 0 1 0 3 1  ?Change in appetite  0 0 0 0  0 0  ?Feeling bad or failure about yourself   0 0 0 1 0 0  ?Trouble concentrating  0 0 0 0 0 0  ?Moving slowly or fidgety/restless  0 0 1 0 0 0  ?Suicidal thoughts  0 0 0 0 0 0  ?PHQ-9 Score  0 0 5 2 4 1   ?Difficult doing work/chores  Not difficult at all Not difficult at all Somewhat difficult   Not difficult at all  ?  ? ?Review of Systems  ?Constitutional:  Positive for unexpected weight change.  ?HENT: Negative.    ?Eyes: Negative.   ?Respiratory: Negative.    ?Cardiovascular: Negative.   ?Gastrointestinal: Negative.   ?Endocrine: Negative.   ?Genitourinary: Negative.   ?Musculoskeletal:  Positive for back pain and gait problem.  ?Skin: Negative.   ?Allergic/Immunologic: Negative.   ?Neurological:  Positive for numbness.  ?Hematological: Negative.   ?Psychiatric/Behavioral: Negative.    ? ?   ?Objective:  ? Physical Exam ?Vitals and nursing note reviewed.  ?Constitutional:   ?   Appearance: She is obese.  ?HENT:  ?   Head: Normocephalic and atraumatic.  ?Eyes:  ?   Extraocular Movements: Extraocular movements intact.  ?   Conjunctiva/sclera: Conjunctivae normal.  ?   Pupils: Pupils are equal, round, and reactive to light.  ?Skin: ?   General: Skin is warm and dry.  ?Neurological:  ?   Mental Status: She is alert and oriented to person, place, and time.  ?   Cranial Nerves: No dysarthria or facial asymmetry.  ?   Motor: Weakness present.  ?   Gait: Gait abnormal.  ?   Comments: Motor strength is 5/5 bilateral deltoid, bicep, tricep, grip, left hip flexor, left knee extensor, left ankle dorsiflexor and plantar flexor ?4/5 in the right hip flexor knee extensor and trace and ankle  dorsiflexor and plantar flexor ?Sensation mildly diminished in the right foot. ?Tone no clonus at the ankle however when she stands she does have pronounced toe curling of the great toe as well as small toes on the

## 2021-08-24 NOTE — Patient Instructions (Signed)
OnabotulinumtoxinA injection (Medical Use) ?What is this medication? ?ONABOTULINUMTOXINA (o na BOTT you lye num tox in eh) is a neuro-muscular blocker. This medicine is used to treat crossed eyes, eyelid spasms, severe neck muscle spasms, ankle and toe muscle spasms, and elbow, wrist, and finger muscle spasms. It is also used to treat excessive underarm sweating, to prevent chronic migraine headaches, and to treat loss of bladder control due to neurologic conditions such as multiple sclerosis or spinal cord injury. ?This medicine may be used for other purposes; ask your health care provider or pharmacist if you have questions. ?COMMON BRAND NAME(S): Botox ?What should I tell my care team before I take this medication? ?They need to know if you have any of these conditions: ?breathing problems ?cerebral palsy spasms ?difficulty urinating ?heart problems ?history of surgery where this medicine is going to be used ?infection at the site where this medicine is going to be used ?myasthenia gravis or other neurologic disease ?nerve or muscle disease ?surgery plans ?take medicines that treat or prevent blood clots ?thyroid problems ?an unusual or allergic reaction to botulinum toxin, albumin, other medicines, foods, dyes, or preservatives ?pregnant or trying to get pregnant ?breast-feeding ?How should I use this medication? ?This medicine is for injection into a muscle. It is given by a health care professional in a hospital or clinic setting. ?Talk to your pediatrician regarding the use of this medicine in children. While this drug may be prescribed for children as young as 6 years old for selected conditions, precautions do apply. ?Overdosage: If you think you have taken too much of this medicine contact a poison control center or emergency room at once. ?NOTE: This medicine is only for you. Do not share this medicine with others. ?What if I miss a dose? ?This does not apply. ?What may interact with this  medication? ?aminoglycoside antibiotics like gentamicin, neomycin, tobramycin ?muscle relaxants ?other botulinum toxin injections ?This list may not describe all possible interactions. Give your health care provider a list of all the medicines, herbs, non-prescription drugs, or dietary supplements you use. Also tell them if you smoke, drink alcohol, or use illegal drugs. Some items may interact with your medicine. ?What should I watch for while using this medication? ?Visit your doctor for regular check ups. ?This medicine will cause weakness in the muscle where it is injected. Tell your doctor if you feel unusually weak in other muscles. Get medical help right away if you have problems with breathing, swallowing, or talking. ?This medicine might make your eyelids droop or make you see blurry or double. If you have weak muscles or trouble seeing do not drive a car, use machinery, or do other dangerous activities. ?This medicine contains albumin from human blood. It may be possible to pass an infection in this medicine, but no cases have been reported. Talk to your doctor about the risks and benefits of this medicine. ?If your activities have been limited by your condition, go back to your regular routine slowly after treatment with this medicine. ?What side effects may I notice from receiving this medication? ?Side effects that you should report to your doctor or health care professional as soon as possible: ?allergic reactions like skin rash, itching or hives, swelling of the face, lips, or tongue ?breathing problems ?changes in vision ?chest pain or tightness ?eye irritation, pain ?fast, irregular heartbeat ?infection ?numbness ?speech problems ?swallowing problems ?unusual weakness ?Side effects that usually do not require medical attention (report to your doctor or health care professional  if they continue or are bothersome): ?bruising or pain at site where injected ?drooping eyelid ?dry eyes or  mouth ?headache ?muscles aches, pains ?sensitivity to light ?tearing ?This list may not describe all possible side effects. Call your doctor for medical advice about side effects. You may report side effects to FDA at 1-800-FDA-1088. ?Where should I keep my medication? ?This drug is given in a hospital or clinic and will not be stored at home. ?NOTE: This sheet is a summary. It may not cover all possible information. If you have questions about this medicine, talk to your doctor, pharmacist, or health care provider. ?? 2022 Elsevier/Gold Standard (2017-11-11 00:00:00) ? ?

## 2021-08-25 ENCOUNTER — Telehealth: Payer: Self-pay

## 2021-08-25 DIAGNOSIS — G8111 Spastic hemiplegia affecting right dominant side: Secondary | ICD-10-CM

## 2021-08-25 NOTE — Telephone Encounter (Signed)
Jeani Hawking, PT from [place] called to request a referral for patient to do outpatient PT. Referral sent to [facility]  ?

## 2021-08-28 ENCOUNTER — Ambulatory Visit: Payer: Medicare Other | Admitting: Urology

## 2021-08-28 ENCOUNTER — Encounter: Payer: Self-pay | Admitting: Urology

## 2021-08-28 VITALS — BP 151/86 | HR 91 | Ht 62.0 in | Wt 237.0 lb

## 2021-08-28 DIAGNOSIS — R350 Frequency of micturition: Secondary | ICD-10-CM | POA: Diagnosis not present

## 2021-08-28 NOTE — Progress Notes (Signed)
? ?08/28/2021 ?10:18 AM  ? ?Terri Werner ?Feb 10, 1951 ?[id number] ? ?Referring provider: Margarita Mail, DO ?[address] ?[address] ?[place],  [place] [address] ? ?Chief Complaint  ?Patient presents with  ? Urinary Frequency  ? ? ?HPI: ?Consulted to assess the patient's voiding dysfunction.  At the end of urination she hears almost escort like a bottle.  There is almost a fizz or bubble or foaminess to urine.  Sometimes it is dark.  The symptoms began in September ? ?She wears 1 pad a day and 1 pad at underclothes and changes it once a day.  She sometimes has urge incontinence.  She occasionally leaks with coughing sneezing.  She has rare bedwetting ? ?She had a stroke in January but now she is back to baseline.  She was initially going more frequently.  She is now using a walker. ? ?She has had a hysterectomy.  She is prone to infrequent bowel movements. ? ?She may have had a urinary tract infection in the hospital ? ?I reviewed the CT scan from September 2021 and she had mild diverticulitis.  She did have a positive urine culture May 29, 2021. ? ?No kidney stones.  No bladder surgery. ? ? ? ? ?PMH: ?Past Medical History:  ?Diagnosis Date  ? Arthritis   ? pt reports in back & knees  ? Colon polyps 2010  ? At [facility]  ? Diverticulitis 2016  ? Genital warts   ? GERD (gastroesophageal reflux disease)   ? Heart murmur   ? Hepatitis B   ? History of blood transfusion   ? during each major surgery per pt  ? History of chicken pox   ? History of hiatal hernia   ? History of torn meniscus of right knee   ? HTN (hypertension)   ? Hx of migraine headaches   ? Hypertension   ? Hypopotassemia   ? ? ?Surgical History: ?Past Surgical History:  ?Procedure Laterality Date  ? ABDOMINAL HYSTERECTOMY  2006  ? APPENDECTOMY  1980  ? CESAREAN SECTION  1981  ? COLONOSCOPY WITH PROPOFOL N/A 11/01/2014  ? Procedure: COLONOSCOPY WITH PROPOFOL;  Surgeon: Scot Jun, MD;  Location: [facility] ENDOSCOPY;  Service: Endoscopy;   Laterality: N/A;  ? COLONOSCOPY WITH PROPOFOL N/A 01/05/2020  ? Procedure: COLONOSCOPY WITH PROPOFOL;  Surgeon: Regis Bill, MD;  Location: [facility] ENDOSCOPY;  Service: Endoscopy;  Laterality: N/A;  ? ECTOPIC PREGNANCY SURGERY  1980  ? HERNIA REPAIR  2006  ? Umbilical  ? KNEE ARTHROSCOPY Left 2006  ? torn meniscus  ? TONSILLECTOMY  1971  ? TOTAL ABDOMINAL HYSTERECTOMY W/ BILATERAL SALPINGOOPHORECTOMY  2006  ? VESICOVAGINAL FISTULA CLOSURE W/ TAH    ? ? ?Home Medications:  ?Allergies as of 08/28/2021   ? ?   Reactions  ? Shrimp [shellfish Allergy] Swelling  ? Lips will burn  ? Latex Itching  ? Burning  ? ?  ? ?  ?Medication List  ?  ? ?  ? Accurate as of August 28, 2021 10:18 AM. If you have any questions, ask your nurse or doctor.  ?  ?  ? ?  ? ?acetaminophen 325 MG tablet ?Commonly known as: TYLENOL ?Take 2 tablets (650 mg total) by mouth every 4 (four) hours as needed for mild pain (or temp > 37.5 C (99.5 F)). ?  ?acidophilus Caps capsule ?Take 1 capsule by mouth daily. ?  ?amLODipine 5 MG tablet ?Commonly known as: NORVASC ?Take 1 tablet (5 mg total) by  mouth daily. ?  ?cyclobenzaprine 5 MG tablet ?Commonly known as: FLEXERIL ?Take 1 tablet (5 mg total) by mouth 3 (three) times daily as needed for muscle spasms. ?  ?diclofenac Sodium 1 % Gel ?Commonly known as: VOLTAREN ?Apply 4 g topically 4 (four) times daily. ?  ?gabapentin 300 MG capsule ?Commonly known as: NEURONTIN ?Take 1 capsule (300 mg total) by mouth 3 (three) times daily. ?  ?lidocaine 5 % ?Commonly known as: LIDODERM ?Place 3 patches onto the skin daily. Remove & Discard patch within 12 hours or as directed by MD ?  ?magnesium oxide 400 MG tablet ?Commonly known as: MAG-OX ?Take 0.5 tablets (200 mg total) by mouth at bedtime. ?  ?pantoprazole 40 MG tablet ?Commonly known as: PROTONIX ?Take 1 tablet (40 mg total) by mouth at bedtime. ?  ?senna-docusate 8.6-50 MG tablet ?Commonly known as: Senokot-S ?Take 1 tablet by mouth 2 (two) times daily. ?   ?valsartan-hydrochlorothiazide 80-12.5 MG tablet ?Commonly known as: DIOVAN-HCT ?Take 1 tablet by mouth daily. ?  ?Vitamin D 50 MCG (2000 UT) tablet ?Take 1 tablet (2,000 Units total) by mouth daily. ?  ? ?  ? ? ?Allergies:  ?Allergies  ?Allergen Reactions  ? Shrimp [Shellfish Allergy] Swelling  ?  Lips will burn ?  ? Latex Itching  ?  Burning  ? ? ?Family History: ?Family History  ?Problem Relation Age of Onset  ? Breast cancer Sister 2  ? Hypertension Mother   ? Alzheimer's disease Mother   ? Dementia Mother   ? Hypertension Maternal Grandfather   ? Hyperlipidemia Maternal Grandfather   ? Birth defects Maternal Grandmother   ?     Bladder  ? Colon cancer Maternal Grandmother   ? Congestive Heart Failure Father   ? Hyperlipidemia Father   ? Dementia Paternal Grandmother   ? Dementia Paternal Grandfather   ? Thyroid disease Sister   ? ? ?Social History:  reports that she has never smoked. She has never used smokeless tobacco. She reports current alcohol use. She reports that she does not use drugs. ? ?ROS: ?  ? ?  ? ?  ? ?  ? ?  ? ?  ? ?  ? ?  ? ?  ? ?  ? ?  ? ?  ? ?  ? ?Physical Exam: ?BP (!) 151/86   Pulse 91   Ht 5\' 2"  (1.575 m)   Wt 107.5 kg   BMI 43.35 kg/m?   ?Constitutional:  Alert and oriented, No acute distress. ?HEENT: [place] AT, moist mucus membranes.  Trachea midline, no masses. ?Cardiovascular: No clubbing, cyanosis, or edema. ?Respiratory: Normal respiratory effort, no increased work of breathing. ?GI: Abdomen is soft, nontender, nondistended, no abdominal masses ?GU: Her pelvic exam was challenging due to body habitus and less mobility but she was very compliant.  I saw no significant vaginal prolapse or stress incontinence. ?Skin: No rashes, bruises or suspicious lesions. ?Lymph: No cervical or inguinal adenopathy. ?Neurologic: Grossly intact, no focal deficits, moving all 4 extremities. ?Psychiatric: Normal mood and affect. ? ?Laboratory Data: ?Lab Results  ?Component Value Date  ? WBC 7.0  06/06/2021  ? HGB 12.6 06/06/2021  ? HCT 38.1 06/06/2021  ? MCV 88.0 06/06/2021  ? PLT 180 06/06/2021  ? ? ?Lab Results  ?Component Value Date  ? CREATININE 1.07 (H) 06/06/2021  ? ? ?No results found for: PSA ? ?No results found for: TESTOSTERONE ? ?Lab Results  ?Component Value Date  ? HGBA1C 5.9 (A)  02/27/2021  ? ? ?Urinalysis ?   ?Component Value Date/Time  ? COLORURINE YELLOW 05/28/2021 1908  ? APPEARANCEUR CLOUDY (A) 05/28/2021 1908  ? LABSPEC 1.025 05/28/2021 1908  ? PHURINE 5.5 05/28/2021 1908  ? GLUCOSEU NEGATIVE 05/28/2021 1908  ? HGBUR SMALL (A) 05/28/2021 1908  ? BILIRUBINUR NEGATIVE 05/28/2021 1908  ? BILIRUBINUR Negative 02/27/2021 1001  ? Lavenia Atlas NEGATIVE 05/28/2021 1908  ? PROTEINUR 30 (A) 05/28/2021 1908  ? UROBILINOGEN 0.2 02/27/2021 1001  ? NITRITE POSITIVE (A) 05/28/2021 1908  ? LEUKOCYTESUR LARGE (A) 05/28/2021 1908  ? ? ?Pertinent Imaging: ?Urine reviewed.  Urine sent for culture.  Chart reviewed ? ?Assessment & Plan: Patient has mild mixed incontinence.  She does have a risk factor for a fistula but is not getting urinary tract infections.  She had mild diverticulitis a few years ago.  She had a recent negative colonoscopy. ? ?Urine looked infected and call if culture positive. ? ?1. Urine frequency ? ?- Urinalysis, Complete ? ? ?No follow-ups on file. ? ?Martina Sinner, MD ? ?[facility] ?[address], [address] ?[place], [place] [address] ?[phone]  ?

## 2021-08-28 NOTE — Patient Instructions (Signed)

## 2021-08-29 ENCOUNTER — Encounter: Payer: Self-pay | Admitting: Neurology

## 2021-08-29 ENCOUNTER — Ambulatory Visit: Payer: Medicare Other | Admitting: Neurology

## 2021-08-29 VITALS — BP 149/78 | HR 81 | Ht 62.0 in | Wt 238.0 lb

## 2021-08-29 DIAGNOSIS — R29898 Other symptoms and signs involving the musculoskeletal system: Secondary | ICD-10-CM | POA: Diagnosis not present

## 2021-08-29 DIAGNOSIS — M21371 Foot drop, right foot: Secondary | ICD-10-CM

## 2021-08-29 DIAGNOSIS — I611 Nontraumatic intracerebral hemorrhage in hemisphere, cortical: Secondary | ICD-10-CM

## 2021-08-29 DIAGNOSIS — E782 Mixed hyperlipidemia: Secondary | ICD-10-CM

## 2021-08-29 LAB — MICROSCOPIC EXAMINATION

## 2021-08-29 LAB — URINALYSIS, COMPLETE
Bilirubin, UA: NEGATIVE
Glucose, UA: NEGATIVE
Ketones, UA: NEGATIVE
Leukocytes,UA: NEGATIVE
Nitrite, UA: POSITIVE — AB
Protein,UA: NEGATIVE
Specific Gravity, UA: 1.02 (ref 1.005–1.030)
Urobilinogen, Ur: 0.2 mg/dL (ref 0.2–1.0)
pH, UA: 6 (ref 5.0–7.5)

## 2021-08-29 NOTE — Progress Notes (Signed)
?[facility] ?[id number] ?[place]. [place] [address] ?[phone] ? ?     OFFICE FOLLOW-UP NOTE ? ?[name] ?Date of Birth:  [birth year] ?Medical Record Number:  [id number]  ? ?HPI: [name] is a pleasant 71 year old African-American lady seen today for initial office visit following hospital admission for intracerebral hemorrhage in January 2023.  History is obtained from the patient and review of electronic medical records and opossum reviewed pertinent available imaging films in PACS.  She has past medical history of hypertension, diverticulitis, migraines and gastroesophageal reflux disease.  She ran out of her blood pressure medications and not refilled it for a while.  She was at church when she noticed sudden onset of right leg weakness.  CT scan of the head showed acute parenchymal hemorrhage in the left frontal parasagittal region with mild cytotoxic edema but no midline shift or intraventricular extension.  CT angiogram of the head and neck did not show any large vessel stenosis occlusion or aneurysms.  NIH stroke scale on admission was 5.  ICH score was 0.  She was admitted to the intensive care unit and required IV Cleviprex drip for blood pressure control.  CT venogram was negative for dural venous sinus thrombosis.  MRI scan of the brain showed parenchymal left frontal parasagittal hematoma with cytotoxic edema but no underlying mass lesion.  There were multiple microhemorrhages in the white matter compatible with hypertensive small vessel disease versus amyloid angiopathy.  2D echo showed normal ejection fraction of 60 to 65% with mild left ventricular hypertrophy.  EEG was normal.  LDL cholesterol is 101 mg percent.  Hemoglobin A1c was 5.4.  Patient was on Crestor 5 mg daily prior to admission.  She met criteria for inclusion in the Saturn trial (statin versus no statin after parenchymal ntracerebral hemorrhage) and was randomized to the no statin.   She was transferred to inpatient rehab where she did well and has been discharged home.  She is finished home physical and occupational therapy and plans to start outpatient therapy next week.  She is able to ambulate using a walker for long distances and a cane for indoors and short distances.  She is using an AFO.  She still has right foot drop and has stiffness and dragging of the right leg.  She states her blood pressure control has been borderline ranging from 140-160 systolic.  She has cut out all external salt in the food and has been very compliant with her medications. ? ?ROS:   ?14 system review of systems is positive for leg weakness, difficulty walking, disturbed sleep, back pain all other systems negative ? ?PMH:  ?Past Medical History:  ?Diagnosis Date  ? Arthritis   ? pt reports in back & knees  ? Colon polyps 2010  ? At [facility]  ? Diverticulitis 2016  ? Genital warts   ? GERD (gastroesophageal reflux disease)   ? Heart murmur   ? Hepatitis B   ? History of blood transfusion   ? during each major surgery per pt  ? History of chicken pox   ? History of hiatal hernia   ? History of torn meniscus of right knee   ? HTN (hypertension)   ? Hx of migraine headaches   ? Hypertension   ? Hypopotassemia   ? ? ?Social History:  ?Social History  ? ?Socioeconomic History  ? Marital status: Widowed  ?  Spouse name: Not on file  ? Number of children: 2  ?  Years of education: Not on file  ? Highest education level: Master's degree (e.g., MA, MS, MEng, MEd, MSW, MBA)  ?Occupational History  ? Occupation: [employer]  ?  Comment: Full Time  ?  Employer: [employer]  ?Tobacco Use  ? Smoking status: Never  ? Smokeless tobacco: Never  ?Vaping Use  ? Vaping Use: Never used  ?Substance and Sexual Activity  ? Alcohol use: Yes  ?  Comment: rare  ? Drug use: No  ? Sexual activity: Not Currently  ?  Partners: Male  ?Other Topics Concern  ? Not on file  ?Social History Narrative  ? Works  for [employer] and helps her 3 grandchildren she helps with since her daughter just recently became employed again after surgery.  ?   ? Regular Exercise -  NO  ? Daily Caffeine Use:  1 soda/tea   ?   ?   ? ?Social Determinants of Health  ? ?Financial Resource Strain: Not on file  ?Food Insecurity: Not on file  ?Transportation Needs: Not on file  ?Physical Activity: Not on file  ?Stress: Not on file  ?Social Connections: Not on file  ?Intimate Partner Violence: Not on file  ? ? ?Medications:   ?Current Outpatient Medications on File Prior to Visit  ?Medication Sig Dispense Refill  ? acetaminophen (TYLENOL) 325 MG tablet Take 2 tablets (650 mg total) by mouth every 4 (four) hours as needed for mild pain (or temp > 37.5 C (99.5 F)).    ? acidophilus (RISAQUAD) CAPS capsule Take 1 capsule by mouth daily. 30 capsule 0  ? amLODipine (NORVASC) 5 MG tablet Take 1 tablet (5 mg total) by mouth daily. 90 tablet 3  ? Cholecalciferol (VITAMIN D) 50 MCG (2000 UT) tablet Take 1 tablet (2,000 Units total) by mouth daily. 90 tablet 3  ? cyclobenzaprine (FLEXERIL) 5 MG tablet Take 1 tablet (5 mg total) by mouth 3 (three) times daily as needed for muscle spasms. 30 tablet 0  ? diclofenac Sodium (VOLTAREN) 1 % GEL Apply 4 g topically 4 (four) times daily. 400 g 0  ? gabapentin (NEURONTIN) 300 MG capsule Take 1 capsule (300 mg total) by mouth 3 (three) times daily. 90 capsule 3  ? senna-docusate (SENOKOT-S) 8.6-50 MG tablet Take 1 tablet by mouth 2 (two) times daily. 90 tablet 3  ? valsartan-hydrochlorothiazide (DIOVAN-HCT) 80-12.5 MG tablet Take 1 tablet by mouth daily. 30 tablet 3  ? ?No current facility-administered medications on file prior to visit.  ? ? ?Allergies:   ?Allergies  ?Allergen Reactions  ? Shrimp [Shellfish Allergy] Swelling  ?  Lips will burn ?  ? Latex Itching  ?  Burning  ? ? ?Physical Exam ?General: Obese middle-age African-American lady, seated, in no evident distress ?Head: head normocephalic and atraumatic.   ?Neck: supple with no carotid or supraclavicular bruits ?Cardiovascular: regular rate and rhythm, no murmurs ?Musculoskeletal: no deformity ?Skin:  no rash/petichiae ?Vascular:  Normal pulses all extremities ?Vitals:  ? 08/29/21 0810  ?BP: (!) 149/78  ?Pulse: 81  ? ?Neurologic Exam ?Mental Status: Awake and fully alert. Oriented to place and time. Recent and remote memory intact. Attention span, concentration and fund of knowledge appropriate. Mood and affect appropriate.  ?Cranial Nerves: Fundoscopic exam reveals sharp disc margins. Pupils equal, briskly reactive to light. Extraocular movements full without nystagmus. Visual fields full to confrontation. Hearing intact. Facial sensation intact. Face, tongue, palate moves normally and symmetrically.  ?Motor: Normal bulk  and tone. Normal strength in all tested extremity muscles mild right hip flexor weakness.  Right foot drop with ankle dorsiflexors 0/5 and plantar flexors 3/5.  Wearing an AFO.Marland Kitchen ?Sensory.: intact to touch ,pinprick .position and vibratory sensation.  ?Coordination: Rapid alternating movements normal in all extremities. Finger-to-nose and heel-to-shin performed accurately bilaterally. ?Gait and Station: Arises from chair without difficulty. Stance is normal. Gait demonstrates right foot drop with stiffness and dragging of the right leg  ?reflexes: 1+ and symmetric. Toes downgoing.  ? ?NIHSS  1 ?Modified Rankin  2 ? ? ?ASSESSMENT:71 year old African-American lady with left frontal parenchymal intracerebral hemorrhage in January 2023 likely from uncontrolled hypertension.  She is doing well except for mild residual right leg weakness.  She is enrolled in the Saturn trial (statin versus no statin after intracerebral hemorrhage) and randomized to the no statin arm. ? ? ? ? ?PLAN: I had a long discussion with the patient and her cousin regarding her intracerebral hemorrhage with the severe right leg weakness which is likely related to uncontrolled  hypertension.  Recommend strict control of hypertension with goal below 140/90.  She was advised to eliminate all salt from the diet and to eat a healthy diet and exercise regularly and lose weight.  She was encou

## 2021-08-29 NOTE — Patient Instructions (Signed)
I had a long discussion with the patient and her cousin regarding her intracerebral hemorrhage with the severe right leg weakness which is likely related to uncontrolled hypertension.  Recommend strict control of hypertension with goal below 140/90.  She was advised to eliminate all salt from the diet and to eat a healthy diet and exercise regularly and lose weight.  She was encouraged to start outpatient physical occupational therapy and to use a walker while walking outdoors and cane indoors and follow fall and safety precautions.  She will continue participation in the [employer] trial (statin versus no statin after intracerebral hemorrhage) .  Return for follow-up in the future in 6 months with my nurse practitioner call earlier if necessary. ?Fall Prevention in the Home, Adult ?Falls can cause injuries and can happen to people of all ages. There are many things you can do to make your home safe and to help prevent falls. Ask for help when making these changes. ?What actions can I take to prevent falls? ?General Instructions ?Use good lighting in all rooms. Replace any light bulbs that burn out. ?Turn on the lights in dark areas. Use night-lights. ?Keep items that you use often in easy-to-reach places. Lower the shelves around your home if needed. ?Set up your furniture so you have a clear path. Avoid moving your furniture around. ?Do not have throw rugs or other things on the floor that can make you trip. ?Avoid walking on wet floors. ?If any of your floors are uneven, fix them. ?Add color or contrast paint or tape to clearly mark and help you see: ?Grab bars or handrails. ?First and last steps of staircases. ?Where the edge of each step is. ?If you use a stepladder: ?Make sure that it is fully opened. Do not climb a closed stepladder. ?Make sure the sides of the stepladder are locked in place. ?Ask someone to hold the stepladder while you use it. ?Know where your pets are when moving through your home. ?What can I  do in the bathroom? ? ?  ? ?Keep the floor dry. Clean up any water on the floor right away. ?Remove soap buildup in the tub or shower. ?Use nonskid mats or decals on the floor of the tub or shower. ?Attach bath mats securely with double-sided, nonslip rug tape. ?If you need to sit down in the shower, use a plastic, nonslip stool. ?Install grab bars by the toilet and in the tub and shower. Do not use towel bars as grab bars. ?What can I do in the bedroom? ?Make sure that you have a light by your bed that is easy to reach. ?Do not use any sheets or blankets for your bed that hang to the floor. ?Have a firm chair with side arms that you can use for support when you get dressed. ?What can I do in the kitchen? ?Clean up any spills right away. ?If you need to reach something above you, use a step stool with a grab bar. ?Keep electrical cords out of the way. ?Do not use floor polish or wax that makes floors slippery. ?What can I do with my stairs? ?Do not leave any items on the stairs. ?Make sure that you have a light switch at the top and the bottom of the stairs. ?Make sure that there are handrails on both sides of the stairs. Fix handrails that are broken or loose. ?Install nonslip stair treads on all your stairs. ?Avoid having throw rugs at the top or bottom of the stairs. ?  Choose a carpet that does not hide the edge of the steps on the stairs. ?Check carpeting to make sure that it is firmly attached to the stairs. Fix carpet that is loose or worn. ?What can I do on the outside of my home? ?Use bright outdoor lighting. ?Fix the edges of walkways and driveways and fix any cracks. ?Remove anything that might make you trip as you walk through a door, such as a raised step or threshold. ?Trim any bushes or trees on paths to your home. ?Check to see if handrails are loose or broken and that both sides of all steps have handrails. ?Install guardrails along the edges of any raised decks and porches. ?Clear paths of anything  that can make you trip, such as tools or rocks. ?Have leaves, snow, or ice cleared regularly. ?Use sand or salt on paths during winter. ?Clean up any spills in your garage right away. This includes grease or oil spills. ?What other actions can I take? ?Wear shoes that: ?Have a low heel. Do not wear high heels. ?Have rubber bottoms. ?Feel good on your feet and fit well. ?Are closed at the toe. Do not wear open-toe sandals. ?Use tools that help you move around if needed. These include: ?Canes. ?Walkers. ?Scooters. ?Crutches. ?Review your medicines with your doctor. Some medicines can make you feel dizzy. This can increase your chance of falling. ?Ask your doctor what else you can do to help prevent falls. ?Where to find more information ?Centers for Disease Control and Prevention, STEADI: http://www.wolf.info/ ?[employer] on Aging: http://kim-miller.com/ ?Contact a doctor if: ?You are afraid of falling at home. ?You feel weak, drowsy, or dizzy at home. ?You fall at home. ?Summary ?There are many simple things that you can do to make your home safe and to help prevent falls. ?Ways to make your home safe include removing things that can make you trip and installing grab bars in the bathroom. ?Ask for help when making these changes in your home. ?This information is not intended to replace advice given to you by your health care provider. Make sure you discuss any questions you have with your health care provider. ?Document Revised: 01/30/2021 Document Reviewed: 12/02/2019 ?Elsevier Patient Education ? [place]. ? ?

## 2021-08-31 DIAGNOSIS — I69351 Hemiplegia and hemiparesis following cerebral infarction affecting right dominant side: Secondary | ICD-10-CM | POA: Diagnosis not present

## 2021-08-31 DIAGNOSIS — I639 Cerebral infarction, unspecified: Secondary | ICD-10-CM | POA: Diagnosis not present

## 2021-09-04 ENCOUNTER — Telehealth: Payer: Self-pay | Admitting: *Deleted

## 2021-09-04 LAB — CULTURE, URINE COMPREHENSIVE

## 2021-09-04 NOTE — Telephone Encounter (Addendum)
Left Vm to return call ? ? ?----- Message from Bjorn Loser, MD sent at 09/04/2021  8:12 AM EDT ----- ?Ciprofloxacin 250 mg twice a day for 7 days ?----- Message ----- ?From: Alvera Novel, CMA ?Sent: 08/31/2021   8:21 AM EDT ?To: Bjorn Loser, MD ? ? ?----- Message ----- ?From: Interface, [facility] Lab Results In ?Sent: 08/29/2021   9:36 AM EDT ?To: [facility] ? ? ? ?

## 2021-09-05 MED ORDER — CIPROFLOXACIN HCL 250 MG PO TABS: 250.0000 mg | ORAL_TABLET | Freq: Two times a day (BID) | ORAL | 0 refills | Status: DC

## 2021-09-05 NOTE — Telephone Encounter (Signed)
PATIENT RETURNED THE CALL ?I LET HER KNOW THAT HER MEDICATION HAD BEEN CALLED INTO THE PHARMACY. ?PT VOICED UNDERSTANDING NO QUESTIONS ?MICHELLE ?

## 2021-09-05 NOTE — Addendum Note (Signed)
Addended by: Verlene Mayer A on: 09/05/2021 02:23 PM ? ? Modules accepted: Orders ? ?

## 2021-09-07 DIAGNOSIS — Z01419 Encounter for gynecological examination (general) (routine) without abnormal findings: Secondary | ICD-10-CM | POA: Diagnosis not present

## 2021-09-14 ENCOUNTER — Ambulatory Visit: Payer: Medicare Other | Attending: Neurology

## 2021-09-14 DIAGNOSIS — R262 Difficulty in walking, not elsewhere classified: Secondary | ICD-10-CM | POA: Diagnosis not present

## 2021-09-14 DIAGNOSIS — G8111 Spastic hemiplegia affecting right dominant side: Secondary | ICD-10-CM | POA: Insufficient documentation

## 2021-09-14 DIAGNOSIS — M6281 Muscle weakness (generalized): Secondary | ICD-10-CM | POA: Insufficient documentation

## 2021-09-14 DIAGNOSIS — R269 Unspecified abnormalities of gait and mobility: Secondary | ICD-10-CM | POA: Diagnosis not present

## 2021-09-14 DIAGNOSIS — I619 Nontraumatic intracerebral hemorrhage, unspecified: Secondary | ICD-10-CM | POA: Insufficient documentation

## 2021-09-14 DIAGNOSIS — R2681 Unsteadiness on feet: Secondary | ICD-10-CM | POA: Diagnosis not present

## 2021-09-14 NOTE — Therapy (Signed)
?[facility] ?[address] ?[place], [place], [address] ?Phone: [phone]   Fax:  [phone] ? ?Physical Therapy Evaluation ? ?Patient Details  ?Name: Terri Werner ?MRN: [id number] ?Date of Birth: 14-Oct-1950 ?Referring Provider (PT): Cristopher Peru MD ? ? ?Encounter Date: 09/14/2021 ? ? PT End of Session - 09/14/21 0905   ? ? Visit Number 1   ? Number of Visits 24   ? Date for PT Re-Evaluation 12/07/21   ? Authorization Type 1/10 eval 09/14/21   ? PT Start Time 0800   ? PT Stop Time 0846   ? PT Time Calculation (min) 46 min   ? Equipment Utilized During Treatment Gait belt   ? Activity Tolerance Patient tolerated treatment well   ? Behavior During Therapy [facility] for tasks assessed/performed   ? ?  ?  ? ?  ? ? ?Past Medical History:  ?Diagnosis Date  ? Arthritis   ? pt reports in back & knees  ? Colon polyps 2010  ? At [facility]  ? Diverticulitis 2016  ? Genital warts   ? GERD (gastroesophageal reflux disease)   ? Heart murmur   ? Hepatitis B   ? History of blood transfusion   ? during each major surgery per pt  ? History of chicken pox   ? History of hiatal hernia   ? History of torn meniscus of right knee   ? HTN (hypertension)   ? Hx of migraine headaches   ? Hypertension   ? Hypopotassemia   ? ? ?Past Surgical History:  ?Procedure Laterality Date  ? ABDOMINAL HYSTERECTOMY  2006  ? APPENDECTOMY  1980  ? CESAREAN SECTION  1981  ? COLONOSCOPY WITH PROPOFOL N/A 11/01/2014  ? Procedure: COLONOSCOPY WITH PROPOFOL;  Surgeon: Scot Jun, MD;  Location: [facility] ENDOSCOPY;  Service: Endoscopy;  Laterality: N/A;  ? COLONOSCOPY WITH PROPOFOL N/A 01/05/2020  ? Procedure: COLONOSCOPY WITH PROPOFOL;  Surgeon: Regis Bill, MD;  Location[facility] ENDOSCOPY;  Service: Endoscopy;  Laterality: N/A;  ? ECTOPIC PREGNANCY SURGERY  1980  ? HERNIA REPAIR  2006  ? Umbilical  ? KNEE ARTHROSCOPY Left 2006  ? torn meniscus  ? TONSILLECTOMY  1971  ? TOTAL ABDOMINAL HYSTERECTOMY W/  BILATERAL SALPINGOOPHORECTOMY  2006  ? VESICOVAGINAL FISTULA CLOSURE W/ TAH    ? ? ?There were no vitals filed for this visit. ? ? ? Subjective Assessment - 09/14/21 0807   ? ? Subjective Patient presents to physical therapy for evaluation of R spastic hemiplegia.   ? Pertinent History Terri Werner is a 71 y.o. right-handed female with history of hepatitis B, arthritis, diverticulitis, GERD, hyperlipidemia, hypertension. Patient is recently retired.  She has a daughter and niece in the area.  Presented 05/27/2021 with sudden onset of right leg weakness and mild headache.  Cranial CT scan showed acute parenchymal hemorrhage in the parasagittal left frontal parietal lobes with mild edema.  No substantial mass effect.  CT angiogram head and neck no abnormal vascularity in the region of hemorrhage no hemodynamically significant stenosis.  MRI follow-up showed no mass effect seen underlying the left posterior frontal hematoma. Patient admitted to inpatient rehab on 05/31/21 and discharged on 06/27/21. Patient used home health at home that stopped two weeks ago. Patient has R foot drop. Has a wheelchair she does household chores in, a RW, a quad cane and R AFO   ? Limitations Walking;Standing;Lifting;House hold activities   ? How long can you  sit comfortably? several hours   ? How long can you stand comfortably? uses walker for safety   ? How long can you walk comfortably? uses walker, uses motorized chair at grocery store   ? Patient Stated Goals restore ability to walk, live alone and drive car.   ? Currently in Pain? Yes   ? Pain Score 2    ? Pain Location Back   ? Pain Orientation Right;Lower   ? Pain Descriptors / Indicators Aching   ? Pain Type Chronic pain   ? Pain Onset More than a month ago   ? Pain Frequency Constant   ? ?  ?  ? ?  ? ? ? ? ? [facility] PT Assessment - 09/14/21 0001   ? ?  ? Assessment  ? Medical Diagnosis R spastic hemiplegia   ? Referring Provider (PT) Cristopher Peru MD   ? Onset  Date/Surgical Date 05/27/21   ? Hand Dominance Right   ? Next MD Visit may 22   ? Prior Therapy CIR   ?  ? Precautions  ? Precautions None   ? Required Braces or Orthoses Other Brace/Splint   R AFO  ?  ? Restrictions  ? Weight Bearing Restrictions No   ?  ? Balance Screen  ? Has the patient fallen in the past 6 months No   ? Has the patient had a decrease in activity level because of a fear of falling?  Yes   ? Is the patient reluctant to leave their home because of a fear of falling?  No   ?  ? Home Environment  ? Living Environment Private residence   ? Available Help at Discharge Available PRN/intermittently;Home health   ? Type of Home House   ? Home Access Ramped entrance   ? Home Layout One level   ?  ? Prior Function  ? Level of Independence Independent   ? Vocation Retired   ? Leisure travel, visit daughter   ?  ? Cognition  ? Overall Cognitive Status Within Functional Limits for tasks assessed   ?  ? Observation/Other Assessments  ? Focus on Therapeutic Outcomes (FOTO)  48%   ?  ? Standardized Balance Assessment  ? Standardized Balance Assessment Berg Balance Test   ?  ? Berg Balance Test  ? Sit to Stand Able to stand without using hands and stabilize independently   ? Standing Unsupported Able to stand 2 minutes with supervision   ? Sitting with Back Unsupported but Feet Supported on Floor or Stool Able to sit safely and securely 2 minutes   ? Stand to Sit Sits safely with minimal use of hands   ? Transfers Able to transfer safely, minor use of hands   ? Standing Unsupported with Eyes Closed Able to stand 3 seconds   ? Standing Unsupported with Feet Together Able to place feet together independently but unable to hold for 30 seconds   ? From Standing, Reach Forward with Outstretched Arm Can reach forward >5 cm safely (2")   ? From Standing Position, Pick up Object from Floor Able to pick up shoe, needs supervision   ? From Standing Position, Turn to Look Behind Over each Shoulder Turn sideways only but  maintains balance   ? Turn 360 Degrees Needs close supervision or verbal cueing   ? Standing Unsupported, Alternately Place Feet on Step/Stool Needs assistance to keep from falling or unable to try   ? Standing Unsupported, One Foot in Front Needs  help to step but can hold 15 seconds   ? Standing on One Leg Unable to try or needs assist to prevent fall   ? Total Score 32   ? ?  ?  ? ?  ? ? ? PAIN: ?Lower back: across to R leg to knee. Worse in the morning:  ?Worst: 10/10 ?Least: 2/10 ?Current: 2/10 ? ?POSTURE: ?Slight lateral weight shift on LLE  ? ?STRENGTH:  Graded on a 0-5 scale ?Muscle Group Left Right  ?Hip Flex 5/5 3+/5  ?Hip Abd 5/5 3+/5  ?Hip Add 5/5 3+/5  ?Hip Ext 5/5 2+/5  ?Hip IR/ER 5/5 3+/5  ?Knee Flex 5/5 3+/5  ?Knee Ext 5/5 3+/5  ?Ankle DF 5/5 1/5  ?Ankle PF 5/5 1/5  ? ?SENSATION:  ?BUE :  ?BLE :  ? ?NEUROLOGICAL SCREEN: (2+ unless otherwise noted.) N=normal  Ab=abnormal ?  ?Level Dermatome R L  ?L2 Medial thigh/groin N N  ?L3 Lower thigh/med.knee N N  ?L4 Medial leg/lat thigh N N  ?L5 Lat. leg & dorsal foot N N  ?S1 post/lat foot/thigh/leg N N  ?S2 Post./med. thigh & leg N N  ?  ?SOMATOSENSORY:  ?Any N & T in extremities or weakness: reports : ?  ?      Sensation           Intact      Diminished         Absent  ?Light touch LEs     ?                         ?COORDINATION: ? ?        Heel Shin Slide Test: limited spatial awareness of RLE  ? ? ?FUNCTIONAL MOBILITY: ?STS: heavy use of LLE with poor foot placement of RLE. ? ?BALANCE: ?Static Sitting Balance  ?Normal Able to maintain balance against maximal resistance   ?Good Able to maintain balance against moderate resistance   ?Good-/Fair+ Accepts minimal resistance x  ?Fair Able to sit unsupported without balance loss and without UE support   ?Poor+ Able to maintain with Minimal assistance from individual or chair   ?Poor Unable to maintain balance-requires mod/max support from individual or chair   ? ?Static Standing Balance  ?Normal Able to maintain  standing balance against maximal resistance   ?Good Able to maintain standing balance against moderate resistance   ?Good-/Fair+ Able to maintain standing balance against minimal resistance   ?Fair Able to stand un

## 2021-09-14 NOTE — Patient Instructions (Signed)
? ?  Access Code: [email or web address] ?URL: [email or web address] ?Date: 09/14/2021 ?Prepared by: Janna Arch ? ?Exercises ?- Leg Extension  - 1 x daily - 7 x weekly - 2 sets - 10 reps - 5 hold ?- Seated March  - 1 x daily - 7 x weekly - 2 sets - 10 reps - 5 hold ?- Seated Heel Toe Raises  - 1 x daily - 7 x weekly - 2 sets - 10 reps - 5 hold ?- Side to Side Weight Shift with Counter Support  - 1 x daily - 7 x weekly - 2 sets - 10 reps - 5 hold ? ? ?

## 2021-09-19 ENCOUNTER — Ambulatory Visit (INDEPENDENT_AMBULATORY_CARE_PROVIDER_SITE_OTHER): Payer: Medicare Other | Admitting: Internal Medicine

## 2021-09-19 ENCOUNTER — Ambulatory Visit: Payer: Medicare Other | Admitting: Family Medicine

## 2021-09-19 ENCOUNTER — Ambulatory Visit
Admission: RE | Admit: 2021-09-19 | Discharge: 2021-09-19 | Disposition: A | Discharge status: Home / Self Care | Payer: Medicare Other | Attending: Internal Medicine | Admitting: Internal Medicine

## 2021-09-19 ENCOUNTER — Ambulatory Visit
Admission: RE | Admit: 2021-09-19 | Discharge: 2021-09-19 | Disposition: A | Discharge status: Home / Self Care | Payer: Medicare Other | Source: Ambulatory Visit | Attending: Internal Medicine | Admitting: Internal Medicine

## 2021-09-19 ENCOUNTER — Encounter: Payer: Self-pay | Admitting: Internal Medicine

## 2021-09-19 VITALS — BP 136/76 | HR 86 | Temp 97.6°F | Resp 16 | Ht 62.0 in | Wt 232.8 lb

## 2021-09-19 DIAGNOSIS — Z8679 Personal history of other diseases of the circulatory system: Secondary | ICD-10-CM | POA: Diagnosis not present

## 2021-09-19 DIAGNOSIS — K5909 Other constipation: Secondary | ICD-10-CM | POA: Diagnosis not present

## 2021-09-19 DIAGNOSIS — I10 Essential (primary) hypertension: Secondary | ICD-10-CM

## 2021-09-19 DIAGNOSIS — G8929 Other chronic pain: Secondary | ICD-10-CM

## 2021-09-19 DIAGNOSIS — M25561 Pain in right knee: Secondary | ICD-10-CM

## 2021-09-19 MED ORDER — VALSARTAN-HYDROCHLOROTHIAZIDE 80-12.5 MG PO TABS: 1.0000 | ORAL_TABLET | Freq: Every day | ORAL | 3 refills | Status: DC

## 2021-09-19 MED ORDER — CYCLOBENZAPRINE HCL 5 MG PO TABS: 5.0000 mg | ORAL_TABLET | Freq: Every day | ORAL | 1 refills | Status: DC | PRN

## 2021-09-19 MED ORDER — POLYETHYLENE GLYCOL 3350 17 GM/SCOOP PO POWD
17.0000 g | Freq: Every day | ORAL | 1 refills | Status: AC | PRN
Start: 2021-09-19 — End: ?

## 2021-09-19 NOTE — Assessment & Plan Note (Signed)
Discussed staying well-hydrated and fiber supplements to help keep bowel movements regular.  She does take senna as needed for constipation, will send in MiraLAX to pharmacy.  She will start with 1 capful a day with the goal of having 1 soft bowel movement a day. ?

## 2021-09-19 NOTE — Assessment & Plan Note (Signed)
Working on lifestyle management, doing well with physical therapy and becoming more active. ?

## 2021-09-19 NOTE — Patient Instructions (Addendum)
It was great seeing you today! ? ?Plan discussed at today's visit: ?-Medications refilled today ?-No changes to  medications, continue to check blood pressure at home ?-Knee x-ray today  ? ?Follow up in: 3 months  ? ?Take care and let us know if you have any questions or concerns prior to your next visit. ? ?Dr. Rosana Berger ? ?

## 2021-09-19 NOTE — Assessment & Plan Note (Signed)
Overall doing well.  She is now doing outpatient physical therapy twice a week.  She was able to ambulate on her own with a walker.  She does take Flexeril 5 mg prior to her physical therapy sessions and is also taking gabapentin 300 mg in the morning and occasionally in the evenings.  She does have right sided foot drop and wears a brace for this.  She does have an appointment in the next several weeks to discuss potential Botox injection to help with the foot drop. ?

## 2021-09-19 NOTE — Assessment & Plan Note (Signed)
Stable.  Blood pressure at goal currently.  Continue amlodipine 5 mg daily, valsartan-HCTZ 80-12.5 mg.  Refill sent today.  The patient will continue to check blood pressure at home.  Follow-up in 3 months for recheck. ?

## 2021-09-19 NOTE — Progress Notes (Signed)
? ?Established Patient Office Visit ? ?Subjective:  ?Patient ID: Terri Werner, female    DOB: [birth year]  Age: 71 y.o. MRN: [id number] ? ?CC:  ?Chief Complaint  ?Patient presents with  ? Follow-up  ? Hypertension  ? ? ?HPI ?Terri Werner presents for follow up  on blood pressure.   ? ?Hypertension: ?-Medications: Amlodipine 5 mg, Valsartan-HCTZ 80-12.5 added back at LOV about 3 weeks  ?-Patient is compliant with above medications and reports no side effects. ?-Checking BP at home (average): 130/80 ?-Denies any SOB, CP, vision changes, LE edema or symptoms of hypotension.  ? ?HLD/HX of CVA: ?-Medications: Crestor discontinued after SATURN trial but on Flexeril 5 mg once daily (using mostly before PT) and Gabapentin 300 mg in the morning and sometimes in the evening  ?-PT twice a week outpatient now, lives at home alone but has family support at night and on weekends ?-Some right knee pain, wearing foot brace for foot drop. Pain primarily medial but also generalized. Taking Tylenol, using Voltaren gel.  ?-Last lipid panel: Lipid Panel  ?   ?Component Value Date/Time  ? CHOL 158 06/01/2021 0510  ? TRIG 60 06/01/2021 0510  ? HDL 50 06/01/2021 0510  ? CHOLHDL 3.2 06/01/2021 0510  ? VLDL 12 06/01/2021 0510  ? LDLCALC 96 06/01/2021 0510  ? LDLCALC 101 (H) 02/27/2021 1040  ? ?Chronic Constipation: ?-Currently taking Senna PRN ?-Having one BM about every 3 days  ? ?Abnormal Urinary Flow: ?-Following with Urology  ?-Recently had a UTI and was treated with Cipro. Finished the antibiotic without issues.  ? ?Past Medical History:  ?Diagnosis Date  ? Arthritis   ? pt reports in back & knees  ? Colon polyps 2010  ? At [facility]  ? Diverticulitis 2016  ? Genital warts   ? GERD (gastroesophageal reflux disease)   ? Heart murmur   ? Hepatitis B   ? History of blood transfusion   ? during each major surgery per pt  ? History of chicken pox   ? History of hiatal hernia   ? History of torn meniscus of right knee   ?  HTN (hypertension)   ? Hx of migraine headaches   ? Hypertension   ? Hypopotassemia   ? ? ?Past Surgical History:  ?Procedure Laterality Date  ? ABDOMINAL HYSTERECTOMY  2006  ? APPENDECTOMY  1980  ? CESAREAN SECTION  1981  ? COLONOSCOPY WITH PROPOFOL N/A 11/01/2014  ? Procedure: COLONOSCOPY WITH PROPOFOL;  Surgeon: Scot Jun, MD;  Location[facility] ENDOSCOPY;  Service: Endoscopy;  Laterality: N/A;  ? COLONOSCOPY WITH PROPOFOL N/A 01/05/2020  ? Procedure: COLONOSCOPY WITH PROPOFOL;  Surgeon: Regis Bill, MD;  Location: [facility] ENDOSCOPY;  Service: Endoscopy;  Laterality: N/A;  ? ECTOPIC PREGNANCY SURGERY  1980  ? HERNIA REPAIR  2006  ? Umbilical  ? KNEE ARTHROSCOPY Left 2006  ? torn meniscus  ? TONSILLECTOMY  1971  ? TOTAL ABDOMINAL HYSTERECTOMY W/ BILATERAL SALPINGOOPHORECTOMY  2006  ? VESICOVAGINAL FISTULA CLOSURE W/ TAH    ? ? ?Family History  ?Problem Relation Age of Onset  ? Breast cancer Sister 74  ? Hypertension Mother   ? Alzheimer's disease Mother   ? Dementia Mother   ? Hypertension Maternal Grandfather   ? Hyperlipidemia Maternal Grandfather   ? Birth defects Maternal Grandmother   ?     Bladder  ? Colon cancer Maternal Grandmother   ? Congestive Heart Failure Father   ? Hyperlipidemia Father   ?  Dementia Paternal Grandmother   ? Dementia Paternal Grandfather   ? Thyroid disease Sister   ? ? ?Social History  ? ?Socioeconomic History  ? Marital status: Widowed  ?  Spouse name: Not on file  ? Number of children: 2  ? Years of education: Not on file  ? Highest education level: Master's degree (e.g., MA, MS, MEng, MEd, MSW, MBA)  ?Occupational History  ? Occupation: [employer]  ?  Comment: Full Time  ?  Employer: [employer]  ?Tobacco Use  ? Smoking status: Never  ? Smokeless tobacco: Never  ?Vaping Use  ? Vaping Use: Never used  ?Substance and Sexual Activity  ? Alcohol use: Yes  ?  Comment: rare  ? Drug use: No  ? Sexual activity: Not Currently  ?  Partners:  Male  ?Other Topics Concern  ? Not on file  ?Social History Narrative  ? Works for [employer] and helps her 3 grandchildren she helps with since her daughter just recently became employed again after surgery.  ?   ? Regular Exercise -  NO  ? Daily Caffeine Use:  1 soda/tea   ?   ?   ? ?Social Determinants of Health  ? ?Financial Resource Strain: Not on file  ?Food Insecurity: Not on file  ?Transportation Needs: Not on file  ?Physical Activity: Not on file  ?Stress: Not on file  ?Social Connections: Not on file  ?Intimate Partner Violence: Not on file  ? ? ?Outpatient Medications Prior to Visit  ?Medication Sig Dispense Refill  ? acetaminophen (TYLENOL) 325 MG tablet Take 2 tablets (650 mg total) by mouth every 4 (four) hours as needed for mild pain (or temp > 37.5 C (99.5 F)).    ? acidophilus (RISAQUAD) CAPS capsule Take 1 capsule by mouth daily. 30 capsule 0  ? amLODipine (NORVASC) 5 MG tablet Take 1 tablet (5 mg total) by mouth daily. 90 tablet 3  ? Cholecalciferol (VITAMIN D) 50 MCG (2000 UT) tablet Take 1 tablet (2,000 Units total) by mouth daily. 90 tablet 3  ? ciprofloxacin (CIPRO) 250 MG tablet Take 1 tablet (250 mg total) by mouth 2 (two) times daily. 14 tablet 0  ? cyclobenzaprine (FLEXERIL) 5 MG tablet Take 1 tablet (5 mg total) by mouth 3 (three) times daily as needed for muscle spasms. 30 tablet 0  ? diclofenac Sodium (VOLTAREN) 1 % GEL Apply 4 g topically 4 (four) times daily. 400 g 0  ? gabapentin (NEURONTIN) 300 MG capsule Take 1 capsule (300 mg total) by mouth 3 (three) times daily. 90 capsule 3  ? senna-docusate (SENOKOT-S) 8.6-50 MG tablet Take 1 tablet by mouth 2 (two) times daily. 90 tablet 3  ? valsartan-hydrochlorothiazide (DIOVAN-HCT) 80-12.5 MG tablet Take 1 tablet by mouth daily. 30 tablet 3  ? ?No facility-administered medications prior to visit.  ? ? ?Allergies  ?Allergen Reactions  ? Shrimp [Shellfish Allergy] Swelling  ?  Lips will burn ?  ? Latex Itching  ?  Burning  ? ? ?ROS ?Review of  Systems  ?Constitutional:  Negative for chills and fever.  ?Eyes:  Negative for visual disturbance.  ?Respiratory:  Negative for cough and shortness of breath.   ?Cardiovascular:  Negative for chest pain and palpitations.  ?Genitourinary:  Negative for dysuria, flank pain, frequency and hematuria.  ?Neurological:  Positive for weakness. Negative for dizziness and headaches.  ? ?  ?Objective:  ?  ?Physical Exam ?Constitutional:   ?  Appearance: Normal appearance.  ?   Comments: Walking with walker  ?HENT:  ?   Head: Normocephalic and atraumatic.  ?Eyes:  ?   Conjunctiva/sclera: Conjunctivae normal.  ?Cardiovascular:  ?   Rate and Rhythm: Normal rate and regular rhythm.  ?Pulmonary:  ?   Effort: Pulmonary effort is normal.  ?   Breath sounds: Normal breath sounds.  ?Musculoskeletal:     ?   General: Tenderness present.  ?   Comments: Tenderness to palpation at the medial joint line of the right knee  ?Skin: ?   General: Skin is warm and dry.  ?Neurological:  ?   Mental Status: She is alert. Mental status is at baseline.  ?Psychiatric:     ?   Mood and Affect: Mood normal.     ?   Behavior: Behavior normal.  ? ? ?BP 136/76   Pulse 86   Temp 97.6 ?F (36.4 ?C)   Resp 16   Ht 5\' 2"  (1.575 m)   Wt 232 lb 12.8 oz (105.6 kg)   SpO2 100%   BMI 42.58 kg/m?  ?Wt Readings from Last 3 Encounters:  ?09/19/21 232 lb 12.8 oz (105.6 kg)  ?08/29/21 238 lb (108 kg)  ?08/28/21 237 lb (107.5 kg)  ? ?Vitals:  ? 09/19/21 1056  ?BP: 136/76  ? ? ? ? ?Health Maintenance Due  ?Topic Date Due  ? Zoster Vaccines- Shingrix (1 of 2) Never done  ? COVID-19 Vaccine (1) 03/29/2020  ? ? ?There are no preventive care reminders to display for this patient. ? ?Lab Results  ?Component Value Date  ? TSH 1.80 02/27/2021  ? ?Lab Results  ?Component Value Date  ? WBC 7.0 06/06/2021  ? HGB 12.6 06/06/2021  ? HCT 38.1 06/06/2021  ? MCV 88.0 06/06/2021  ? PLT 180 06/06/2021  ? ?Lab Results  ?Component Value Date  ? NA 139 06/06/2021  ? K 4.2 06/06/2021   ? CO2 28 06/06/2021  ? GLUCOSE 144 (H) 06/06/2021  ? BUN 14 06/06/2021  ? CREATININE 1.07 (H) 06/06/2021  ? BILITOT 0.5 06/01/2021  ? ALKPHOS 40 06/01/2021  ? AST 16 06/01/2021  ? ALT 13 06/01/2021  ? PROT 6.7

## 2021-09-20 ENCOUNTER — Ambulatory Visit: Payer: Medicare Other

## 2021-09-20 DIAGNOSIS — G8111 Spastic hemiplegia affecting right dominant side: Secondary | ICD-10-CM | POA: Diagnosis not present

## 2021-09-20 DIAGNOSIS — R262 Difficulty in walking, not elsewhere classified: Secondary | ICD-10-CM

## 2021-09-20 DIAGNOSIS — R2681 Unsteadiness on feet: Secondary | ICD-10-CM

## 2021-09-20 DIAGNOSIS — M6281 Muscle weakness (generalized): Secondary | ICD-10-CM | POA: Diagnosis not present

## 2021-09-20 DIAGNOSIS — I619 Nontraumatic intracerebral hemorrhage, unspecified: Secondary | ICD-10-CM | POA: Diagnosis not present

## 2021-09-20 DIAGNOSIS — R269 Unspecified abnormalities of gait and mobility: Secondary | ICD-10-CM | POA: Diagnosis not present

## 2021-09-20 NOTE — Therapy (Signed)
[facility] ?[address] ?[place], [place], [address] ?Phone: [phone]   Fax:  [phone] ? ?Physical Therapy Treatment ? ?Patient Details  ?Name: Terri Werner ?MRN: [id number] ?Date of Birth: February 02, 1951 ?Referring Provider (PT): Terri Peru MD ? ? ?Encounter Date: 09/20/2021 ? ? PT End of Session - 09/20/21 0756   ? ? Visit Number 2   ? Number of Visits 24   ? Date for PT Re-Evaluation 12/07/21   ? Authorization Type 2/10 eval 09/14/21   ? PT Start Time 0800   ? PT Stop Time 0844   ? PT Time Calculation (min) 44 min   ? Equipment Utilized During Treatment Gait belt   ? Activity Tolerance Patient tolerated treatment well   ? Behavior During Therapy [facility] for tasks assessed/performed   ? ?  ?  ? ?  ? ? ?Past Medical History:  ?Diagnosis Date  ? Arthritis   ? pt reports in back & knees  ? Colon polyps 2010  ? At [facility]  ? Diverticulitis 2016  ? Genital warts   ? GERD (gastroesophageal reflux disease)   ? Heart murmur   ? Hepatitis B   ? History of blood transfusion   ? during each major surgery per pt  ? History of chicken pox   ? History of hiatal hernia   ? History of torn meniscus of right knee   ? HTN (hypertension)   ? Hx of migraine headaches   ? Hypertension   ? Hypopotassemia   ? ? ?Past Surgical History:  ?Procedure Laterality Date  ? ABDOMINAL HYSTERECTOMY  2006  ? APPENDECTOMY  1980  ? CESAREAN SECTION  1981  ? COLONOSCOPY WITH PROPOFOL N/A 11/01/2014  ? Procedure: COLONOSCOPY WITH PROPOFOL;  Surgeon: Scot Jun, MD;  Location: [facility] ENDOSCOPY;  Service: Endoscopy;  Laterality: N/A;  ? COLONOSCOPY WITH PROPOFOL N/A 01/05/2020  ? Procedure: COLONOSCOPY WITH PROPOFOL;  Surgeon: Regis Bill, MD;  Location: [facility] ENDOSCOPY;  Service: Endoscopy;  Laterality: N/A;  ? ECTOPIC PREGNANCY SURGERY  1980  ? HERNIA REPAIR  2006  ? Umbilical  ? KNEE ARTHROSCOPY Left 2006  ? torn meniscus  ? TONSILLECTOMY  1971  ? TOTAL ABDOMINAL HYSTERECTOMY W/  BILATERAL SALPINGOOPHORECTOMY  2006  ? VESICOVAGINAL FISTULA CLOSURE W/ TAH    ? ? ?There were no vitals filed for this visit. ? ? Subjective Assessment - 09/20/21 0803   ? ? Subjective Patient reports compliance with HEP. Has downloaded app to tablet and phone. Reports she closed her foot in the door on saturday.   ? Pertinent History Terri Werner is a 71 y.o. right-handed female with history of hepatitis B, arthritis, diverticulitis, GERD, hyperlipidemia, hypertension. Patient is recently retired.  She has a daughter and niece in the area.  Presented 05/27/2021 with sudden onset of right leg weakness and mild headache.  Cranial CT scan showed acute parenchymal hemorrhage in the parasagittal left frontal parietal lobes with mild edema.  No substantial mass effect.  CT angiogram head and neck no abnormal vascularity in the region of hemorrhage no hemodynamically significant stenosis.  MRI follow-up showed no mass effect seen underlying the left posterior frontal hematoma. Patient admitted to inpatient rehab on 05/31/21 and discharged on 06/27/21. Patient used home health at home that stopped two weeks ago. Patient has R foot drop. Has a wheelchair she does household chores in, a RW, a quad cane and R AFO   ? Limitations  Walking;Standing;Lifting;House hold activities   ? How long can you sit comfortably? several hours   ? How long can you stand comfortably? uses walker for safety   ? How long can you walk comfortably? uses walker, uses motorized chair at grocery store   ? Patient Stated Goals restore ability to walk, live alone and drive car.   ? Currently in Pain? Yes   ? Pain Score 8    ? Pain Location Head   ? Pain Orientation Right   ? Pain Descriptors / Indicators Aching   ? Pain Type Chronic pain   ? Pain Onset More than a month ago   ? Pain Frequency Constant   ? ?  ?  ? ?  ? ? ? ? ? ?BP: 89/71; patient moving; attempted again:  120/72 ? ?Neuro Re-ed ? ?Standing with CGA next to support surface:   ?Airex pad: static stand 30 seconds x 2 trials, noticeable trembling of ankles/LE's with fatigue and challenge to maintain stability ?Airex pad: horizontal head turns 30 seconds scanning room 10x ; cueing for arc of motion  ?Airex pad: vertical head turns 30 seconds, cueing for arc of motion, noticeable sway with upward gaze increasing demand on ankle righting reaction musculature ?Airex pad: one foot on 6" step one foot on airex pad, hold position for 30 seconds, switch legs, 2x each LE; ?Airex pad 6" step airex pad sandwhich lateral step up/down 10x each side ?Airex pad; visual scans with reaching outside BOS with dual task of word game x 4 minutes ? ?Orange hurdle: one foot over and back 10x each side. SUE support; very challenging for RLE  ? ?TherEx ?STS 10x ?YTB df  with march 15x RLE  ?Pt educated throughout session about proper posture and technique with exercises. Improved exercise technique, movement at target joints, use of target muscles after min to mod verbal, visual, tactile cues. ? ? ? ? ?Patient presents with excellent motivation throughout physical therapy session. Unstable surfaces tolerated well. She continues to have spatial awareness of RLE challenges. Patient will benefit from skilled physical therapy to increase stability, strength,mobility, and return to PLOF. ? ? ? ? ? ? ? ? ? ? ? ? ? ? ? ? ? ? PT Education - 09/20/21 0756   ? ? Education provided Yes   ? Education Details exercise technique, body mechanics   ? Person(s) Educated Patient   ? Methods Explanation;Demonstration;Tactile cues;Verbal cues   ? Comprehension Verbalized understanding;Returned demonstration;Verbal cues required;Tactile cues required   ? ?  ?  ? ?  ? ? ? PT Short Term Goals - 09/14/21 0908   ? ?  ? PT SHORT TERM GOAL #1  ? Title Patient will be independent in home exercise program to improve strength/mobility for better functional independence with ADLs.   ? Baseline 5/4: HEP given   ? Time 4   ? Period Weeks   ?  Status New   ? Target Date 10/12/21   ? ?  ?  ? ?  ? ? ? ? PT Long Term Goals - 09/14/21 0908   ? ?  ? PT LONG TERM GOAL #1  ? Title Patient will increase FOTO score to equal to or greater than 63%    to demonstrate statistically significant improvement in mobility and quality of life.   ? Baseline 5/4: 48%   ? Time 12   ? Period Weeks   ? Status New   ? Target Date 12/07/21   ?  ?  PT LONG TERM GOAL #2  ? Title Patient will increase 10 meter walk test to >1.71m/s as to improve gait speed for better community ambulation and to reduce fall risk.   ? Baseline 5/4: 0.54 m/s with RW   ? Time 12   ? Period Weeks   ? Status New   ? Target Date 12/07/21   ?  ? PT LONG TERM GOAL #3  ? Title Patient will demonstrate an improved Berg Balance Score of >45/56 as to demonstrate improved balance with ADLs such as sitting/standing and transfer balance and reduced fall risk.   ? Baseline 5/4: 32/56   ? Time 12   ? Period Weeks   ? Status New   ? Target Date 12/07/21   ?  ? PT LONG TERM GOAL #4  ? Title Patient will increase BLE gross strength to 4+/5 as to improve functional strength for independent gait, increased standing tolerance and increased ADL ability.   ? Baseline 5/4: see note   ? Time 12   ? Period Weeks   ? Status New   ? Target Date 12/07/21   ? ?  ?  ? ?  ? ? ? ? ? ? ? ? Plan - 09/20/21 0819   ? ? Clinical Impression Statement Patient presents with excellent motivation throughout physical therapy session. Unstable surfaces tolerated well. She continues to have spatial awareness of RLE challenges. Patient will benefit from skilled physical therapy to increase stability, strength,mobility, and return to PLOF.   ? Personal Factors and Comorbidities Comorbidity 3+;Fitness;Past/Current Experience;Time since onset of injury/illness/exacerbation;Transportation   ? Comorbidities hepatitis B, arthritis, diverticulitis, GERD, hyperlipidemia, hypertension   ? Examination-Activity Limitations Bed Mobility;Caring for  [employer];Locomotion Level;Lift;Squat;Stairs;Stand;Toileting;Transfers   ? Examination-Participation Restrictions Cleaning;Driving;Meal Prep;Laundry;Shop;Volunteer;Pincus Badder Work   ? Stability/Clinica

## 2021-09-22 ENCOUNTER — Ambulatory Visit: Payer: Medicare Other

## 2021-09-22 DIAGNOSIS — M6281 Muscle weakness (generalized): Secondary | ICD-10-CM

## 2021-09-22 DIAGNOSIS — I619 Nontraumatic intracerebral hemorrhage, unspecified: Secondary | ICD-10-CM

## 2021-09-22 DIAGNOSIS — R2681 Unsteadiness on feet: Secondary | ICD-10-CM

## 2021-09-22 DIAGNOSIS — R269 Unspecified abnormalities of gait and mobility: Secondary | ICD-10-CM | POA: Diagnosis not present

## 2021-09-22 DIAGNOSIS — R262 Difficulty in walking, not elsewhere classified: Secondary | ICD-10-CM

## 2021-09-22 DIAGNOSIS — G8111 Spastic hemiplegia affecting right dominant side: Secondary | ICD-10-CM | POA: Diagnosis not present

## 2021-09-22 NOTE — Therapy (Signed)
?[facility] ?[address] ?[place], [place], [address] ?Phone: [phone]   Fax:  [phone] ? ?Physical Therapy Treatment ? ?Patient Details  ?Name: Terri Werner ?MRN: [id number] ?Date of Birth: 13-Dec-1950 ?Referring Provider (PT): Cristopher Peru MD ? ? ?Encounter Date: 09/22/2021 ? ? PT End of Session - 09/22/21 0758   ? ? Visit Number 3   ? Number of Visits 24   ? Date for PT Re-Evaluation 12/07/21   ? Authorization Type 2/10 eval 09/14/21   ? PT Start Time 0800   ? PT Stop Time 0845   ? PT Time Calculation (min) 45 min   ? Equipment Utilized During Treatment Gait belt   ? Activity Tolerance Patient tolerated treatment well   ? Behavior During Therapy [facility] for tasks assessed/performed   ? ?  ?  ? ?  ? ? ?Past Medical History:  ?Diagnosis Date  ? Arthritis   ? pt reports in back & knees  ? Colon polyps 2010  ? At [facility]  ? Diverticulitis 2016  ? Genital warts   ? GERD (gastroesophageal reflux disease)   ? Heart murmur   ? Hepatitis B   ? History of blood transfusion   ? during each major surgery per pt  ? History of chicken pox   ? History of hiatal hernia   ? History of torn meniscus of right knee   ? HTN (hypertension)   ? Hx of migraine headaches   ? Hypertension   ? Hypopotassemia   ? ? ?Past Surgical History:  ?Procedure Laterality Date  ? ABDOMINAL HYSTERECTOMY  2006  ? APPENDECTOMY  1980  ? CESAREAN SECTION  1981  ? COLONOSCOPY WITH PROPOFOL N/A 11/01/2014  ? Procedure: COLONOSCOPY WITH PROPOFOL;  Surgeon: Scot Jun, MD;  Location: [facility] ENDOSCOPY;  Service: Endoscopy;  Laterality: N/A;  ? COLONOSCOPY WITH PROPOFOL N/A 01/05/2020  ? Procedure: COLONOSCOPY WITH PROPOFOL;  Surgeon: Regis Bill, MD;  Location: [facility] ENDOSCOPY;  Service: Endoscopy;  Laterality: N/A;  ? ECTOPIC PREGNANCY SURGERY  1980  ? HERNIA REPAIR  2006  ? Umbilical  ? KNEE ARTHROSCOPY Left 2006  ? torn meniscus  ? TONSILLECTOMY  1971  ? TOTAL ABDOMINAL HYSTERECTOMY W/  BILATERAL SALPINGOOPHORECTOMY  2006  ? VESICOVAGINAL FISTULA CLOSURE W/ TAH    ? ? ?There were no vitals filed for this visit. ? ? Subjective Assessment - 09/22/21 0756   ? ? Subjective Patient reports doing well and states desire to be as independent as possible.   ? Pertinent History Terri Werner is a 71 y.o. right-handed female with history of hepatitis B, arthritis, diverticulitis, GERD, hyperlipidemia, hypertension. Patient is recently retired.  She has a daughter and niece in the area.  Presented 05/27/2021 with sudden onset of right leg weakness and mild headache.  Cranial CT scan showed acute parenchymal hemorrhage in the parasagittal left frontal parietal lobes with mild edema.  No substantial mass effect.  CT angiogram head and neck no abnormal vascularity in the region of hemorrhage no hemodynamically significant stenosis.  MRI follow-up showed no mass effect seen underlying the left posterior frontal hematoma. Patient admitted to inpatient rehab on 05/31/21 and discharged on 06/27/21. Patient used home health at home that stopped two weeks ago. Patient has R foot drop. Has a wheelchair she does household chores in, a RW, a quad cane and R AFO   ? Limitations Walking;Standing;Lifting;House hold activities   ? How long can  you sit comfortably? several hours   ? How long can you stand comfortably? uses walker for safety   ? How long can you walk comfortably? uses walker, uses motorized chair at grocery store   ? Patient Stated Goals restore ability to walk, live alone and drive car.   ? Currently in Pain? Yes   ? Pain Onset More than a month ago   ? ?  ?  ? ?  ? ? ? ?INTERVENTIONS:  ? ? ? ?BP: 135/88 mmHg Left UE ?  ?Neuro Re-ed ?  ?Standing with CGA in // bars: ?  ?Side step over orange hurdle- Difficulty clearing hurdle with right LE so switched to  ?Side step over 1/2 foam x 10 reps each LE - less difficulty with Right LE ?Airex pad: static stand 30 seconds with eyes open, then closed-  Noticeable trembling of ankles/LE's with fatigue and challenge to maintain stability- Mostly using ankle righting strategy.  ?Airex pad: horizontal head turns  scanning room 10x ; cueing for arc of motion  ?Airex pad: vertical head turns x 10, cueing for arc of motion ?Dynamic Marching in place in // bars without UE support x 20 reps each LE- Increased unsteadiness standing on Right LE  ?  ?  ?TherEx ?STS 10x with Left foot on purple pad to shift power to right LE.  ?Step up in // bars with Right LE x 15 reps each LE ? ?Pt educated throughout session about proper posture and technique with exercises. Improved exercise technique, movement at target joints, use of target muscles after min to mod verbal, visual, tactile cues. ?  ?  ?  ?  ? ?  ? ? ? ? ? ? ? ? ? ? ? ? ? ? ? ? ? ? ? ? ? ? ? PT Education - 09/22/21 0757   ? ? Education provided Yes   ? Education Details Exercise technique   ? Person(s) Educated Patient   ? Methods Explanation;Demonstration;Tactile cues;Verbal cues   ? Comprehension Verbalized understanding;Returned demonstration;Verbal cues required;Tactile cues required;Need further instruction   ? ?  ?  ? ?  ? ? ? PT Short Term Goals - 09/14/21 0908   ? ?  ? PT SHORT TERM GOAL #1  ? Title Patient will be independent in home exercise program to improve strength/mobility for better functional independence with ADLs.   ? Baseline 5/4: HEP given   ? Time 4   ? Period Weeks   ? Status New   ? Target Date 10/12/21   ? ?  ?  ? ?  ? ? ? ? PT Long Term Goals - 09/14/21 0908   ? ?  ? PT LONG TERM GOAL #1  ? Title Patient will increase FOTO score to equal to or greater than 63%    to demonstrate statistically significant improvement in mobility and quality of life.   ? Baseline 5/4: 48%   ? Time 12   ? Period Weeks   ? Status New   ? Target Date 12/07/21   ?  ? PT LONG TERM GOAL #2  ? Title Patient will increase 10 meter walk test to >1.31m/s as to improve gait speed for better community ambulation and to reduce  fall risk.   ? Baseline 5/4: 0.54 m/s with RW   ? Time 12   ? Period Weeks   ? Status New   ? Target Date 12/07/21   ?  ? PT LONG TERM GOAL #  3  ? Title Patient will demonstrate an improved Berg Balance Score of >45/56 as to demonstrate improved balance with ADLs such as sitting/standing and transfer balance and reduced fall risk.   ? Baseline 5/4: 32/56   ? Time 12   ? Period Weeks   ? Status New   ? Target Date 12/07/21   ?  ? PT LONG TERM GOAL #4  ? Title Patient will increase BLE gross strength to 4+/5 as to improve functional strength for independent gait, increased standing tolerance and increased ADL ability.   ? Baseline 5/4: see note   ? Time 12   ? Period Weeks   ? Status New   ? Target Date 12/07/21   ? ?  ?  ? ?  ? ? ? ? ? ? ? ? Plan - 09/22/21 0758   ? ? Clinical Impression Statement Patient presents with good participate in Strengthening and balance activities today. She was challenged with standing on unstable surfaces and any activity involving SLS. She was fatigued at end of session yet able to progress with Right LE strengthening without significant difficulty including biased STS activity.  Patient will benefit from skilled physical therapy to increase stability, strength,mobility, and return to PLOF.   ? Personal Factors and Comorbidities Comorbidity 3+;Fitness;Past/Current Experience;Time since onset of injury/illness/exacerbation;Transportation   ? Comorbidities hepatitis B, arthritis, diverticulitis, GERD, hyperlipidemia, hypertension   ? Examination-Activity Limitations Bed Mobility;Caring for [employer];Locomotion Level;Lift;Squat;Stairs;Stand;Toileting;Transfers   ? Examination-Participation Restrictions Cleaning;Driving;Meal Prep;Laundry;Shop;Volunteer;Pincus Badder Work   ? Stability/Clinical Decision Making Evolving/Moderate complexity   ? Rehab Potential Fair   ? PT Frequency 2x / week   ? PT Duration 12 weeks   ? PT Treatment/Interventions ADLs/Self Care Home  Management;Canalith Repostioning;Cryotherapy;Electrical Stimulation;Iontophoresis 4mg /ml Dexamethasone;Moist Heat;Traction;Ultrasound;Functional mobility training;Stair training;Gait training;DME Instruction;Therapeutic

## 2021-09-28 ENCOUNTER — Encounter: Payer: Self-pay | Admitting: Physical Therapy

## 2021-09-28 ENCOUNTER — Ambulatory Visit: Payer: Medicare Other | Admitting: Physical Therapy

## 2021-09-28 DIAGNOSIS — M6281 Muscle weakness (generalized): Secondary | ICD-10-CM

## 2021-09-28 DIAGNOSIS — R2681 Unsteadiness on feet: Secondary | ICD-10-CM | POA: Diagnosis not present

## 2021-09-28 DIAGNOSIS — R262 Difficulty in walking, not elsewhere classified: Secondary | ICD-10-CM | POA: Diagnosis not present

## 2021-09-28 DIAGNOSIS — G8111 Spastic hemiplegia affecting right dominant side: Secondary | ICD-10-CM | POA: Diagnosis not present

## 2021-09-28 DIAGNOSIS — I619 Nontraumatic intracerebral hemorrhage, unspecified: Secondary | ICD-10-CM | POA: Diagnosis not present

## 2021-09-28 DIAGNOSIS — R269 Unspecified abnormalities of gait and mobility: Secondary | ICD-10-CM | POA: Diagnosis not present

## 2021-09-28 NOTE — Therapy (Signed)
hemorrhage 07/12/2021   Intraparenchymal hemorrhage of brain ([place]) 05/31/2021   ICH (intracerebral hemorrhage) ([place]) 05/27/2021   Morbid obesity ([place]) 09/24/2018   Unspecified inflammatory spondylopathy, lumbar region ([place]) 03/05/2018   Lumbar spondylolysis 08/20/2017   Lumbar facet arthropathy 08/20/2017   Lumbar degenerative disc disease 08/20/2017   Chronic constipation 07/30/2017   Primary osteoarthritis involving multiple joints 04/17/2017   Postmenopausal 10/12/2016   Prediabetes 10/12/2016   Lumbago 08/04/2015   Diverticulitis of colon 07/05/2014   Carotid artery disorder ([place]) 04/27/2014   Hyperlipidemia 10/08/2013   Hypertension, benign 11/19/2009    Kden Wagster, PT, DPT 09/28/2021, 10:13 AM  [facility] [address] [place], [place], [address] Phone: [phone]   Fax:  [phone]  Name: Terri Werner MRN: [id number] Date of Birth: 11/17/1950  hemorrhage 07/12/2021   Intraparenchymal hemorrhage of brain ([place]) 05/31/2021   ICH (intracerebral hemorrhage) ([place]) 05/27/2021   Morbid obesity ([place]) 09/24/2018   Unspecified inflammatory spondylopathy, lumbar region ([place]) 03/05/2018   Lumbar spondylolysis 08/20/2017   Lumbar facet arthropathy 08/20/2017   Lumbar degenerative disc disease 08/20/2017   Chronic constipation 07/30/2017   Primary osteoarthritis involving multiple joints 04/17/2017   Postmenopausal 10/12/2016   Prediabetes 10/12/2016   Lumbago 08/04/2015   Diverticulitis of colon 07/05/2014   Carotid artery disorder ([place]) 04/27/2014   Hyperlipidemia 10/08/2013   Hypertension, benign 11/19/2009    Kden Wagster, PT, DPT 09/28/2021, 10:13 AM  [facility] [address] [place], [place], [address] Phone: [phone]   Fax:  [phone]  Name: Terri Werner MRN: [id number] Date of Birth: 11/17/1950  [place] MAIN [facility] SERVICES [address] [place], [place], [address] Phone: [phone]   Fax:  [phone]  Physical Therapy Treatment  Patient Details  Name: TARSHA BLANDO MRN: [id number] Date of Birth: Jan 01, 1951 Referring Provider (PT): Jennings Books MD   Encounter Date: 09/28/2021   PT End of Session - 09/28/21 0814     Visit Number 4    Number of Visits 24    Date for PT Re-Evaluation 12/07/21    Authorization Type 2/10 eval 09/14/21    PT Start Time 0807    PT Stop Time 0850    PT Time Calculation (min) 43 min    Equipment Utilized During Treatment Gait belt    Activity Tolerance Patient tolerated treatment well    Behavior During Therapy [facility] for tasks assessed/performed             Past Medical History:  Diagnosis Date   Arthritis    pt reports in back & knees   Colon polyps 2010   At [facility]   Diverticulitis 2016   Genital warts    GERD (gastroesophageal reflux disease)    Heart murmur    Hepatitis B    History of blood transfusion    during each major surgery per pt   History of chicken pox    History of hiatal hernia    History of torn meniscus of right knee    HTN (hypertension)    Hx of migraine headaches    Hypertension    Hypopotassemia     Past Surgical History:  Procedure Laterality Date   ABDOMINAL HYSTERECTOMY  2006   [place]   COLONOSCOPY WITH PROPOFOL N/A 11/01/2014   Procedure: COLONOSCOPY WITH PROPOFOL;  Surgeon: Manya Silvas, MD;  Location: [place];  Service: Endoscopy;  Laterality: N/A;   COLONOSCOPY WITH PROPOFOL N/A 01/05/2020   Procedure: COLONOSCOPY WITH PROPOFOL;  Surgeon: [name], MD;  Location: [facility] ENDOSCOPY;  Service: Endoscopy;  Laterality: N/A;   [place]   HERNIA REPAIR  1884   Umbilical   KNEE ARTHROSCOPY Left 2006   torn meniscus   TONSILLECTOMY  1971   TOTAL ABDOMINAL HYSTERECTOMY W/  BILATERAL SALPINGOOPHORECTOMY  2006   VESICOVAGINAL FISTULA CLOSURE W/ TAH      There were no vitals filed for this visit.   Subjective Assessment - 09/28/21 0813     Subjective Patient reports doing well. She did not take her muscle relaxer this morning as she was rushing to get here. She reports mild general soreness. Reports HEP is going well.    Pertinent History KEYANNI WHITTINGHILL is a 71 y.o. right-handed female with history of hepatitis B, arthritis, diverticulitis, GERD, hyperlipidemia, hypertension. Patient is recently retired.  She has a daughter and niece in the area.  Presented 05/27/2021 with sudden onset of right leg weakness and mild headache.  Cranial CT scan showed acute parenchymal hemorrhage in the parasagittal left frontal parietal lobes with mild edema.  No substantial mass effect.  CT angiogram head and neck no abnormal vascularity in the region of hemorrhage no hemodynamically significant stenosis.  MRI follow-up showed no mass effect seen underlying the left posterior frontal hematoma. Patient admitted to inpatient rehab on 05/31/21 and discharged on 06/27/21. Patient used home health at home that stopped two weeks ago. Patient has R foot drop. Has a wheelchair she does household chores in, a RW, a quad  hemorrhage 07/12/2021   Intraparenchymal hemorrhage of brain ([place]) 05/31/2021   ICH (intracerebral hemorrhage) ([place]) 05/27/2021   Morbid obesity ([place]) 09/24/2018   Unspecified inflammatory spondylopathy, lumbar region ([place]) 03/05/2018   Lumbar spondylolysis 08/20/2017   Lumbar facet arthropathy 08/20/2017   Lumbar degenerative disc disease 08/20/2017   Chronic constipation 07/30/2017   Primary osteoarthritis involving multiple joints 04/17/2017   Postmenopausal 10/12/2016   Prediabetes 10/12/2016   Lumbago 08/04/2015   Diverticulitis of colon 07/05/2014   Carotid artery disorder ([place]) 04/27/2014   Hyperlipidemia 10/08/2013   Hypertension, benign 11/19/2009    Kden Wagster, PT, DPT 09/28/2021, 10:13 AM  [place] [address] [place], [place], [address] Phone: [phone]   Fax:  [phone]  Name: Terri Werner MRN: [id number] Date of Birth: 11/17/1950

## 2021-09-28 NOTE — Patient Instructions (Signed)
Access Code: [email or web address] URL: [email or web address] Date: 09/28/2021 Prepared by: Blanche East  Exercises - Standing Hip Extension with Resistance at Ankles and Counter Support  - 1 x daily - 7 x weekly - 2 sets - 10 reps - Standing Hip Abduction with Resistance at Ankles and Counter Support  - 1 x daily - 7 x weekly - 2 sets - 10 reps - Standing Hip Flexion with Resistance at Ankles and Counter Support  - 1 x daily - 7 x weekly - 2 sets - 10 reps - Side Stepping with Resistance at Ankles and Counter Support  - 1 x daily - 7 x weekly - 1 sets - 10 reps - Seated Toe Raise  - 1 x daily - 7 x weekly - 1 sets - 10 reps - Seated Heel Raise  - 1 x daily - 7 x weekly - 1 sets - 10 reps - Seated Toe Curl  - 1 x daily - 7 x weekly - 1 sets - 10 reps - Seated Great Toe Extension  - 1 x daily - 7 x weekly - 1 sets - 10 reps

## 2021-10-02 ENCOUNTER — Ambulatory Visit: Payer: Medicare Other | Admitting: Urology

## 2021-10-02 ENCOUNTER — Encounter: Payer: Self-pay | Admitting: Urology

## 2021-10-02 VITALS — BP 136/84 | HR 79

## 2021-10-02 DIAGNOSIS — N3020 Other chronic cystitis without hematuria: Secondary | ICD-10-CM | POA: Diagnosis not present

## 2021-10-02 DIAGNOSIS — N321 Vesicointestinal fistula: Secondary | ICD-10-CM | POA: Diagnosis not present

## 2021-10-02 DIAGNOSIS — R82998 Other abnormal findings in urine: Secondary | ICD-10-CM

## 2021-10-02 DIAGNOSIS — N3946 Mixed incontinence: Secondary | ICD-10-CM

## 2021-10-02 LAB — MICROSCOPIC EXAMINATION

## 2021-10-02 LAB — URINALYSIS, COMPLETE
Bilirubin, UA: NEGATIVE
Glucose, UA: NEGATIVE
Ketones, UA: NEGATIVE
Leukocytes,UA: NEGATIVE
Nitrite, UA: NEGATIVE
Protein,UA: NEGATIVE
Specific Gravity, UA: >1.03 — ABNORMAL HIGH (ref 1.005–1.030)
Urobilinogen, Ur: 0.2 mg/dL (ref 0.2–1.0)
pH, UA: 5.5 (ref 5.0–7.5)

## 2021-10-02 NOTE — Progress Notes (Signed)
10/02/2021 10:18 AM   Terri Werner [birth year] [id number]  Referring provider: Alba Cory, MD [address] [place],  [place] [address]  Chief Complaint  Patient presents with   Cysto    HPI: Consulted to assess the patient's voiding dysfunction.  At the end of urination she hears almost escort like a bottle.  There is almost a fizz or bubble or foaminess to urine.  Sometimes it is dark.  The symptoms began in September   She wears 1 pad a day and 1 pad at underclothes and changes it once a day.  She sometimes has urge incontinence.  She occasionally leaks with coughing sneezing.  She has rare bedwetting  She had a stroke in January but now she is back to baseline.  She was initially going more frequently.  She is now using a walker.  She has had a hysterectomy.  She is prone to infrequent bowel movements.  She may have had a urinary tract infection in the hospital   I reviewed the CT scan from September 2021 and she had mild diverticulitis.  She did have a positive urine culture May 29, 2021.    Her pelvic exam was challenging due to body habitus and less mobility but she was very compliant.  I saw no significant vaginal prolapse or stress incontinence.  Patient has mild mixed incontinence.  She does have a risk factor for a fistula but is not getting urinary tract infections.  She had mild diverticulitis a few years ago.  She had a recent negative colonoscopy.  Today Frequency stable.  Last culture positive.  The patient said the foaminess went away when she took the antibiotics but twice she thought she may have seen a little bit of fecal matter in the urine  Cystoscopy: Patient underwent flexible cystoscopy.  Bladder mucosa overall negative with no cystitis.  Trigone normal.  In the midline at the dome there was a small protrusion of tissue with no obvious opening and it did not look like a carcinoma.  It certainly may have represented a  fistula  PMH: Past Medical History:  Diagnosis Date   Arthritis    pt reports in back & knees   Colon polyps 2010   At [facility]   Diverticulitis 2016   Genital warts    GERD (gastroesophageal reflux disease)    Heart murmur    Hepatitis B    History of blood transfusion    during each major surgery per pt   History of chicken pox    History of hiatal hernia    History of torn meniscus of right knee    HTN (hypertension)    Hx of migraine headaches    Hypertension    Hypopotassemia     Surgical History: Past Surgical History:  Procedure Laterality Date   ABDOMINAL HYSTERECTOMY  2006   APPENDECTOMY  1980   CESAREAN SECTION  1981   COLONOSCOPY WITH PROPOFOL N/A 11/01/2014   Procedure: COLONOSCOPY WITH PROPOFOL;  Surgeon: Scot Jun, MD;  Location: [facility] ENDOSCOPY;  Service: Endoscopy;  Laterality: N/A;   COLONOSCOPY WITH PROPOFOL N/A 01/05/2020   Procedure: COLONOSCOPY WITH PROPOFOL;  Surgeon: Regis Bill, MD;  Location: [facility] ENDOSCOPY;  Service: Endoscopy;  Laterality: N/A;   ECTOPIC PREGNANCY SURGERY  1980   HERNIA REPAIR  2006   Umbilical   KNEE ARTHROSCOPY Left 2006   torn meniscus   TONSILLECTOMY  1971   TOTAL ABDOMINAL HYSTERECTOMY W/ BILATERAL SALPINGOOPHORECTOMY  2006   VESICOVAGINAL FISTULA CLOSURE W/ TAH      Home Medications:  Allergies as of 10/02/2021       Reactions   Shrimp [shellfish Allergy] Swelling   Lips will burn   Latex Itching   Burning        Medication List        Accurate as of Oct 02, 2021 10:18 AM. If you have any questions, ask your nurse or doctor.          acetaminophen 325 MG tablet Commonly known as: TYLENOL Take 2 tablets (650 mg total) by mouth every 4 (four) hours as needed for mild pain (or temp > 37.5 C (99.5 F)).   acidophilus Caps capsule Take 1 capsule by mouth daily.   amLODipine 5 MG tablet Commonly known as: NORVASC Take 1 tablet (5 mg total) by mouth daily.   ciprofloxacin 250 MG  tablet Commonly known as: Cipro Take 1 tablet (250 mg total) by mouth 2 (two) times daily.   cyclobenzaprine 5 MG tablet Commonly known as: FLEXERIL Take 1 tablet (5 mg total) by mouth daily as needed for muscle spasms.   diclofenac Sodium 1 % Gel Commonly known as: VOLTAREN Apply 4 g topically 4 (four) times daily.   gabapentin 300 MG capsule Commonly known as: NEURONTIN Take 1 capsule (300 mg total) by mouth 3 (three) times daily.   polyethylene glycol powder 17 GM/SCOOP powder Commonly known as: GLYCOLAX/MIRALAX Take 17 g by mouth daily as needed for moderate constipation.   senna-docusate 8.6-50 MG tablet Commonly known as: Senokot-S Take 1 tablet by mouth 2 (two) times daily.   valsartan-hydrochlorothiazide 80-12.5 MG tablet Commonly known as: DIOVAN-HCT Take 1 tablet by mouth daily.   Vitamin D 50 MCG (2000 UT) tablet Take 1 tablet (2,000 Units total) by mouth daily.        Allergies:  Allergies  Allergen Reactions   Shrimp [Shellfish Allergy] Swelling    Lips will burn    Latex Itching    Burning    Family History: Family History  Problem Relation Age of Onset   Breast cancer Sister 58   Hypertension Mother    Alzheimer's disease Mother    Dementia Mother    Hypertension Maternal Grandfather    Hyperlipidemia Maternal Grandfather    Birth defects Maternal Grandmother        Bladder   Colon cancer Maternal Grandmother    Congestive Heart Failure Father    Hyperlipidemia Father    Dementia Paternal Grandmother    Dementia Paternal Grandfather    Thyroid disease Sister     Social History:  reports that she has never smoked. She has never used smokeless tobacco. She reports current alcohol use. She reports that she does not use drugs.  ROS:                                        Physical Exam: There were no vitals taken for this visit.    Laboratory Data: Lab Results  Component Value Date   WBC 7.0 06/06/2021    HGB 12.6 06/06/2021   HCT 38.1 06/06/2021   MCV 88.0 06/06/2021   PLT 180 06/06/2021    Lab Results  Component Value Date   CREATININE 1.07 (H) 06/06/2021    No results found for: PSA  No results found for: TESTOSTERONE  Lab Results  Component Value  Date   HGBA1C 5.9 (A) 02/27/2021    Urinalysis    Component Value Date/Time   COLORURINE YELLOW 05/28/2021 1908   APPEARANCEUR Hazy (A) 08/28/2021 1017   LABSPEC 1.025 05/28/2021 1908   PHURINE 5.5 05/28/2021 1908   GLUCOSEU Negative 08/28/2021 1017   HGBUR SMALL (A) 05/28/2021 1908   BILIRUBINUR Negative 08/28/2021 1017   KETONESUR NEGATIVE 05/28/2021 1908   PROTEINUR Negative 08/28/2021 1017   PROTEINUR 30 (A) 05/28/2021 1908   UROBILINOGEN 0.2 02/27/2021 1001   NITRITE Positive (A) 08/28/2021 1017   NITRITE POSITIVE (A) 05/28/2021 1908   LEUKOCYTESUR Negative 08/28/2021 1017   LEUKOCYTESUR LARGE (A) 05/28/2021 1908    Pertinent Imaging:   Assessment & Plan: Based upon cystoscopy and history and physical examination patient may have a fistula between her bladder and bowel.  I will order a CT of the abdomen pelvis with cystogram phase looking for fistula and have the patient see Dr. Apolinar Junes in follow-up..  If it is positive the patient will need to be seen by general surgery and likely joint surgery would be performed.  Picture was drawn.  If she does have a fistula she may require open or robotic surgery.  If present the opening looked very small, it did not look like cancer and it was at the dome almost in the midline    There are no diagnoses linked to this encounter.  No follow-ups on file.  Martina Sinner, MD  [facility] [address], [address] [place], [place] [address] [phone]

## 2021-10-02 NOTE — Addendum Note (Signed)
Addended by: Verlene Mayer A on: 10/02/2021 11:11 AM   Modules accepted: Orders

## 2021-10-05 ENCOUNTER — Ambulatory Visit: Payer: Medicare Other

## 2021-10-05 DIAGNOSIS — I619 Nontraumatic intracerebral hemorrhage, unspecified: Secondary | ICD-10-CM | POA: Diagnosis not present

## 2021-10-05 DIAGNOSIS — R262 Difficulty in walking, not elsewhere classified: Secondary | ICD-10-CM

## 2021-10-05 DIAGNOSIS — G8111 Spastic hemiplegia affecting right dominant side: Secondary | ICD-10-CM | POA: Diagnosis not present

## 2021-10-05 DIAGNOSIS — M6281 Muscle weakness (generalized): Secondary | ICD-10-CM | POA: Diagnosis not present

## 2021-10-05 DIAGNOSIS — R2681 Unsteadiness on feet: Secondary | ICD-10-CM

## 2021-10-05 DIAGNOSIS — R269 Unspecified abnormalities of gait and mobility: Secondary | ICD-10-CM

## 2021-10-05 LAB — CULTURE, URINE COMPREHENSIVE

## 2021-10-05 NOTE — Therapy (Signed)
[facility] MAIN [facility] SERVICES [address] [place], [place], [address] Phone: [phone]   Fax:  [phone]  Physical Therapy Treatment  Patient Details  Name: Terri Werner MRN: [id number] Date of Birth: 1950-08-10 Referring Provider (PT): Cristopher Peru MD   Encounter Date: 10/05/2021   PT End of Session - 10/05/21 1231     Visit Number 5    Number of Visits 24    Date for PT Re-Evaluation 12/07/21    Authorization Type 2/10 eval 09/14/21    PT Start Time 0851    PT Stop Time 0929    PT Time Calculation (min) 38 min    Equipment Utilized During Treatment Gait belt    Activity Tolerance Patient tolerated treatment well    Behavior During Therapy [facility] for tasks assessed/performed             Past Medical History:  Diagnosis Date   Arthritis    pt reports in back & knees   Colon polyps 2010   At [facility]   Diverticulitis 2016   Genital warts    GERD (gastroesophageal reflux disease)    Heart murmur    Hepatitis B    History of blood transfusion    during each major surgery per pt   History of chicken pox    History of hiatal hernia    History of torn meniscus of right knee    HTN (hypertension)    Hx of migraine headaches    Hypertension    Hypopotassemia     Past Surgical History:  Procedure Laterality Date   ABDOMINAL HYSTERECTOMY  2006   APPENDECTOMY  1980   CESAREAN SECTION  1981   COLONOSCOPY WITH PROPOFOL N/A 11/01/2014   Procedure: COLONOSCOPY WITH PROPOFOL;  Surgeon: Scot Jun, MD;  Location: [facility] ENDOSCOPY;  Service: Endoscopy;  Laterality: N/A;   COLONOSCOPY WITH PROPOFOL N/A 01/05/2020   Procedure: COLONOSCOPY WITH PROPOFOL;  Surgeon: Regis Bill, MD;  Location: [facility] ENDOSCOPY;  Service: Endoscopy;  Laterality: N/A;   ECTOPIC PREGNANCY SURGERY  1980   HERNIA REPAIR  2006   Umbilical   KNEE ARTHROSCOPY Left 2006   torn meniscus   TONSILLECTOMY  1971   TOTAL ABDOMINAL HYSTERECTOMY W/  BILATERAL SALPINGOOPHORECTOMY  2006   VESICOVAGINAL FISTULA CLOSURE W/ TAH      There were no vitals filed for this visit.   Subjective Assessment - 10/05/21 0858     Subjective Patient reports doing okay today but states was having some low back pain past couple of days.    Pertinent History Terri Werner is a 71 y.o. right-handed female with history of hepatitis B, arthritis, diverticulitis, GERD, hyperlipidemia, hypertension. Patient is recently retired.  She has a daughter and niece in the area.  Presented 05/27/2021 with sudden onset of right leg weakness and mild headache.  Cranial CT scan showed acute parenchymal hemorrhage in the parasagittal left frontal parietal lobes with mild edema.  No substantial mass effect.  CT angiogram head and neck no abnormal vascularity in the region of hemorrhage no hemodynamically significant stenosis.  MRI follow-up showed no mass effect seen underlying the left posterior frontal hematoma. Patient admitted to inpatient rehab on 05/31/21 and discharged on 06/27/21. Patient used home health at home that stopped two weeks ago. Patient has R foot drop. Has a wheelchair she does household chores in, a RW, a quad cane and R AFO    Limitations Walking;Standing;Lifting;House hold activities  How long can you sit comfortably? several hours    How long can you stand comfortably? uses walker for safety    How long can you walk comfortably? uses walker, uses motorized chair at grocery store    Patient Stated Goals restore ability to walk, live alone and drive car.    Currently in Pain? No/denies              INTERVENTIONS:   Neuromuscular re-ed:  -step tap onto purple pad- Alt LE - difficulty with placement  -Coordination step tap onto appropriate colored 1/2 spike ball (red/blue/yellow) upon command - PT called out color- Patient worked on her propriception and coordination- able to step with almost 100% accuracy wearing RAFO. She perform approx  20 + step taps  -Heel to toe gait sequencing in // bars- positioned 2 cones (1 for heel strike location and the other for toe- off) - performed 25 reps of heel to toe on left LE then 25 on right. Patient reported difficult on right to maintain concentration but able to demo good recall and performance.   -Heel to toe gait ambuation in // bars - down and back x 10- focusing on reciprocal gait.    Gait using RW at approx 120 feet - exaggerating heel to toe for reciprocal steps.   Sit to stand from chair with left foot extended out in front slightly to bias power to right side. X 12 reps total.   Education provided throughout session via VC/TC and demonstration to facilitate movement at target joints and correct muscle activation for all testing and exercises performed.                     PT Education - 10/05/21 1233     Education provided Yes    Education Details Exercise technique/HEP    Person(s) Educated Patient    Methods Explanation;Demonstration;Tactile cues;Verbal cues    Comprehension Verbalized understanding;Returned demonstration;Verbal cues required;Tactile cues required;Need further instruction              PT Short Term Goals - 09/14/21 0908       PT SHORT TERM GOAL #1   Title Patient will be independent in home exercise program to improve strength/mobility for better functional independence with ADLs.    Baseline 5/4: HEP given    Time 4    Period Weeks    Status New    Target Date 10/12/21               PT Long Term Goals - 09/14/21 0908       PT LONG TERM GOAL #1   Title Patient will increase FOTO score to equal to or greater than 63%    to demonstrate statistically significant improvement in mobility and quality of life.    Baseline 5/4: 48%    Time 12    Period Weeks    Status New    Target Date 12/07/21      PT LONG TERM GOAL #2   Title Patient will increase 10 meter walk test to >1.3m/s as to improve gait speed for better  community ambulation and to reduce fall risk.    Baseline 5/4: 0.54 m/s with RW    Time 12    Period Weeks    Status New    Target Date 12/07/21      PT LONG TERM GOAL #3   Title Patient will demonstrate an improved Berg Balance Score of >45/56 as to demonstrate  improved balance with ADLs such as sitting/standing and transfer balance and reduced fall risk.    Baseline 5/4: 32/56    Time 12    Period Weeks    Status New    Target Date 12/07/21      PT LONG TERM GOAL #4   Title Patient will increase BLE gross strength to 4+/5 as to improve functional strength for independent gait, increased standing tolerance and increased ADL ability.    Baseline 5/4: see note    Time 12    Period Weeks    Status New    Target Date 12/07/21                   Plan - 10/05/21 1230     Clinical Impression Statement Patient performed well today - able to follow all VC and visual demo of exercises and able to exhibit increased heel strike and improved step length. She was fatigued at end of session yet able to complete all activities improving in confidence with repetitions. She would benefit from additional skilled PT Intervention to improve strength, balance and mobility    Personal Factors and Comorbidities Comorbidity 3+;Fitness;Past/Current Experience;Time since onset of injury/illness/exacerbation;Transportation    Comorbidities hepatitis B, arthritis, diverticulitis, GERD, hyperlipidemia, hypertension    Examination-Activity Limitations Bed Mobility;Caring for [employer];Locomotion Level;Lift;Squat;Stairs;Stand;Toileting;Transfers    Examination-Participation Restrictions Cleaning;Driving;Meal Prep;Laundry;Shop;Volunteer;Yard Work    Conservation officer, historic buildings Evolving/Moderate complexity    Rehab Potential Fair    PT Frequency 2x / week    PT Duration 12 weeks    PT Treatment/Interventions ADLs/Self Care Home Management;Canalith  Repostioning;Cryotherapy;Electrical Stimulation;Iontophoresis 4mg /ml Dexamethasone;Moist Heat;Traction;Ultrasound;Functional mobility training;Stair training;Gait training;DME Instruction;Therapeutic activities;Therapeutic exercise;Balance training;Neuromuscular re-education;Manual techniques;Orthotic Fit/Training;Patient/family education;Passive range of motion;Dry needling;Taping;Splinting;Energy conservation;Visual/perceptual remediation/compensation;Vestibular    PT Next Visit Plan progress stabilization    PT Home Exercise Plan see above    Consulted and Agree with Plan of Care Patient             Patient will benefit from skilled therapeutic intervention in order to improve the following deficits and impairments:  Abnormal gait, Decreased activity tolerance, Decreased balance, Decreased endurance, Decreased coordination, Decreased mobility, Difficulty walking, Decreased strength, Impaired flexibility, Impaired perceived functional ability, Postural dysfunction, Improper body mechanics  Visit Diagnosis: Muscle weakness (generalized)  Difficulty in walking, not elsewhere classified  Unsteadiness on feet  Intraparenchymal hemorrhage of brain (HCC)  Abnormality of gait and mobility     Problem List Patient Active Problem List   Diagnosis Date Noted   Hx of spontaneous intraparenchymal intracranial hemorrhage 07/12/2021   Intraparenchymal hemorrhage of brain (HCC) 05/31/2021   ICH (intracerebral hemorrhage) (HCC) 05/27/2021   Morbid obesity (HCC) 09/24/2018   Unspecified inflammatory spondylopathy, lumbar region (HCC) 03/05/2018   Lumbar spondylolysis 08/20/2017   Lumbar facet arthropathy 08/20/2017   Lumbar degenerative disc disease 08/20/2017   Chronic constipation 07/30/2017   Primary osteoarthritis involving multiple joints 04/17/2017   Postmenopausal 10/12/2016   Prediabetes 10/12/2016   Lumbago 08/04/2015   Diverticulitis of colon 07/05/2014   Carotid artery  disorder (HCC) 04/27/2014   Hyperlipidemia 10/08/2013   Hypertension, benign 11/19/2009    Lenda Kelp, PT 10/05/2021, 12:34 PM  [place] [facility] MAIN [facility] SERVICES [address] [place], [place], [address] Phone: [phone]   Fax:  [phone]  Name: Terri Werner MRN: [id number] Date of Birth: 1950/10/09

## 2021-10-06 ENCOUNTER — Encounter: Payer: Self-pay | Admitting: Physical Medicine & Rehabilitation

## 2021-10-06 ENCOUNTER — Encounter: Payer: Medicare Other | Attending: Registered Nurse | Admitting: Physical Medicine & Rehabilitation

## 2021-10-06 VITALS — BP 143/88 | HR 73 | Ht 62.0 in | Wt 234.8 lb

## 2021-10-06 DIAGNOSIS — G8111 Spastic hemiplegia affecting right dominant side: Secondary | ICD-10-CM | POA: Diagnosis not present

## 2021-10-06 NOTE — Progress Notes (Addendum)
Xeomin Injection for spasticity using needle EMG guidance  Dilution: 50 Units/ml Indication: Severe spasticity which interferes with ADL,mobility and/or  hygiene and is unresponsive to medication management and other conservative care Informed consent was obtained after describing risks and benefits of the procedure with the patient. This includes bleeding, bruising, infection, excessive weakness, or medication side effects. A REMS form is on file and signed. Needle: 2" 25g  needle electrode Number of units per muscle Right FDL 75 units Right FHL 50 units Right gastroc 50 units RIght soleus 50 units All injections were done after obtaining appropriate EMG activity and after negative drawback for blood. The patient tolerated the procedure well. Post procedure instructions were given. A followup appointment was made.

## 2021-10-10 ENCOUNTER — Ambulatory Visit
Admission: RE | Admit: 2021-10-10 | Discharge: 2021-10-10 | Disposition: A | Discharge status: Home / Self Care | Payer: Medicare Other | Source: Ambulatory Visit | Attending: Urology | Admitting: Urology

## 2021-10-10 DIAGNOSIS — N3289 Other specified disorders of bladder: Secondary | ICD-10-CM | POA: Diagnosis not present

## 2021-10-10 DIAGNOSIS — Q7649 Other congenital malformations of spine, not associated with scoliosis: Secondary | ICD-10-CM | POA: Diagnosis not present

## 2021-10-10 DIAGNOSIS — K5730 Diverticulosis of large intestine without perforation or abscess without bleeding: Secondary | ICD-10-CM | POA: Diagnosis not present

## 2021-10-10 DIAGNOSIS — N321 Vesicointestinal fistula: Secondary | ICD-10-CM

## 2021-10-10 DIAGNOSIS — K429 Umbilical hernia without obstruction or gangrene: Secondary | ICD-10-CM | POA: Diagnosis not present

## 2021-10-10 DIAGNOSIS — N3020 Other chronic cystitis without hematuria: Secondary | ICD-10-CM

## 2021-10-10 MED ORDER — IOPAMIDOL (ISOVUE-300) INJECTION 61%
100.0000 mL | Freq: Once | INTRAVENOUS | Status: AC | PRN
  Administered 2021-10-10: 100 mL via INTRAVENOUS

## 2021-10-16 ENCOUNTER — Ambulatory Visit
Admission: RE | Admit: 2021-10-16 | Discharge: 2021-10-16 | Disposition: A | Discharge status: Home / Self Care | Payer: Medicare Other | Source: Ambulatory Visit | Attending: Urology | Admitting: Urology

## 2021-10-16 DIAGNOSIS — N3020 Other chronic cystitis without hematuria: Secondary | ICD-10-CM

## 2021-10-16 NOTE — Addendum Note (Signed)
Addended by: Verlene Mayer A on: 10/16/2021 10:10 AM   Modules accepted: Orders

## 2021-10-17 ENCOUNTER — Ambulatory Visit: Payer: Medicare Other | Attending: Neurology

## 2021-10-17 DIAGNOSIS — M6281 Muscle weakness (generalized): Secondary | ICD-10-CM | POA: Diagnosis not present

## 2021-10-17 DIAGNOSIS — R262 Difficulty in walking, not elsewhere classified: Secondary | ICD-10-CM | POA: Insufficient documentation

## 2021-10-17 DIAGNOSIS — R2681 Unsteadiness on feet: Secondary | ICD-10-CM | POA: Insufficient documentation

## 2021-10-17 DIAGNOSIS — R269 Unspecified abnormalities of gait and mobility: Secondary | ICD-10-CM | POA: Diagnosis not present

## 2021-10-17 DIAGNOSIS — I619 Nontraumatic intracerebral hemorrhage, unspecified: Secondary | ICD-10-CM | POA: Insufficient documentation

## 2021-10-17 NOTE — Therapy (Signed)
[place] MAIN [facility] SERVICES [address] [place], [place], [address] Phone: [phone]   Fax:  [phone]  Physical Therapy Treatment  Patient Details  Name: Terri Werner MRN: [id number] Date of Birth: May 03, 1951 Referring Provider (PT): Jennings Books MD   Encounter Date: 10/17/2021   PT End of Session - 10/17/21 0910     Visit Number 6    Number of Visits 24    Date for PT Re-Evaluation 12/07/21    Authorization Type 2/10 eval 09/14/21    PT Start Time 0758    PT Stop Time 0841    PT Time Calculation (min) 43 min    Equipment Utilized During Treatment Gait belt    Activity Tolerance Patient tolerated treatment well    Behavior During Therapy [facility] for tasks assessed/performed             Past Medical History:  Diagnosis Date   Arthritis    pt reports in back & knees   Colon polyps 2010   At [facility]   Diverticulitis 2016   Genital warts    GERD (gastroesophageal reflux disease)    Heart murmur    Hepatitis B    History of blood transfusion    during each major surgery per pt   History of chicken pox    History of hiatal hernia    History of torn meniscus of right knee    HTN (hypertension)    Hx of migraine headaches    Hypertension    Hypopotassemia     Past Surgical History:  Procedure Laterality Date   ABDOMINAL HYSTERECTOMY  2006   [place]   COLONOSCOPY WITH PROPOFOL N/A 11/01/2014   Procedure: COLONOSCOPY WITH PROPOFOL;  Surgeon: Manya Silvas, MD;  Location: [facility];  Service: Endoscopy;  Laterality: N/A;   COLONOSCOPY WITH PROPOFOL N/A 01/05/2020   Procedure: COLONOSCOPY WITH PROPOFOL;  Surgeon: [name], MD;  Location: [facility] ENDOSCOPY;  Service: Endoscopy;  Laterality: N/A;   [place]   HERNIA REPAIR  [id number]   Umbilical   KNEE ARTHROSCOPY Left 2006   torn meniscus   TONSILLECTOMY  1971   TOTAL ABDOMINAL HYSTERECTOMY W/  BILATERAL SALPINGOOPHORECTOMY  2006   VESICOVAGINAL FISTULA CLOSURE W/ TAH      There were no vitals filed for this visit.   Subjective Assessment - 10/17/21 0801     Subjective Patient reports she received botox injection right foot/gastroc region and states feeling okay today.    Pertinent History Terri Werner is a 71 y.o. right-handed female with history of hepatitis B, arthritis, diverticulitis, GERD, hyperlipidemia, hypertension. Patient is recently retired.  She has a daughter and niece in the area.  Presented 05/27/2021 with sudden onset of right leg weakness and mild headache.  Cranial CT scan showed acute parenchymal hemorrhage in the parasagittal left frontal parietal lobes with mild edema.  No substantial mass effect.  CT angiogram head and neck no abnormal vascularity in the region of hemorrhage no hemodynamically significant stenosis.  MRI follow-up showed no mass effect seen underlying the left posterior frontal hematoma. Patient admitted to inpatient rehab on 05/31/21 and discharged on 06/27/21. Patient used home health at home that stopped two weeks ago. Patient has R foot drop. Has a wheelchair she does household chores in, a RW, a quad cane and R AFO    Limitations Walking;Standing;Lifting;House hold activities    How long  transfer balance and reduced fall risk.    Baseline 5/4: 32/56    Time 12    Period Weeks    Status New    Target Date 12/07/21      PT LONG TERM GOAL #4   Title Patient will increase BLE gross strength to 4+/5 as to improve functional strength for independent gait, increased standing tolerance and increased ADL ability.    Baseline 5/4: see note    Time 12    Period Weeks    Status New    Target Date 12/07/21                   Plan - 10/17/21 0911     Clinical Impression Statement Patient presents with good motivation today- Improved gait sequencing with less scuffing/foot drag without as many VC's. She was challenged with hip strengthening and significant proximal hip weakness- Flex more than abd today. She would benefit from additional skilled PT Intervention to improve strength, balance and mobility for decreased risk of falling and improved quality of life.    Personal Factors and Comorbidities Comorbidity 3+;Fitness;Past/Current Experience;Time since onset of injury/illness/exacerbation;Transportation    Comorbidities hepatitis B, arthritis, diverticulitis, GERD, hyperlipidemia, hypertension    Examination-Activity Limitations Bed Mobility;Caring for [employer];Locomotion Level;Lift;Squat;Stairs;Stand;Toileting;Transfers    Examination-Participation Restrictions Cleaning;Driving;Meal Prep;Laundry;Shop;Volunteer;Yard Work    Merchant navy officer Evolving/Moderate complexity    Rehab Potential Fair    PT Frequency 2x / week    PT Duration 12 weeks    PT Treatment/Interventions ADLs/Self Care Home Management;Canalith Repostioning;Cryotherapy;Electrical Stimulation;Iontophoresis '4mg'$ /ml Dexamethasone;Moist Heat;Traction;Ultrasound;Functional mobility training;Stair training;Gait training;DME  Instruction;Therapeutic activities;Therapeutic exercise;Balance training;Neuromuscular re-education;Manual techniques;Orthotic Fit/Training;Patient/family education;Passive range of motion;Dry needling;Taping;Splinting;Energy conservation;Visual/perceptual remediation/compensation;Vestibular    PT Next Visit Plan progress stabilization    PT Home Exercise Plan see above    Consulted and Agree with Plan of Care Patient             Patient will benefit from skilled therapeutic intervention in order to improve the following deficits and impairments:  Abnormal gait, Decreased activity tolerance, Decreased balance, Decreased endurance, Decreased coordination, Decreased mobility, Difficulty walking, Decreased strength, Impaired flexibility, Impaired perceived functional ability, Postural dysfunction, Improper body mechanics  Visit Diagnosis: Muscle weakness (generalized)  Difficulty in walking, not elsewhere classified  Unsteadiness on feet  Intraparenchymal hemorrhage of brain (HCC)  Abnormality of gait and mobility     Problem List Patient Active Problem List   Diagnosis Date Noted   Hx of spontaneous intraparenchymal intracranial hemorrhage 07/12/2021   Intraparenchymal hemorrhage of brain ([place]) 05/31/2021   ICH (intracerebral hemorrhage) (HCC) 05/27/2021   Morbid obesity ([place]) 09/24/2018   Unspecified inflammatory spondylopathy, 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 [place], [place], [address] Phone: [phone]   Fax:  [phone]  Name: MARYJANE BENEDICT MRN: [id number] Date of Birth: 08-21-1950  [place] MAIN [facility] SERVICES [address] [place], [place], [address] Phone: [phone]   Fax:  [phone]  Physical Therapy Treatment  Patient Details  Name: Terri Werner MRN: [id number] Date of Birth: May 03, 1951 Referring Provider (PT): Jennings Books MD   Encounter Date: 10/17/2021   PT End of Session - 10/17/21 0910     Visit Number 6    Number of Visits 24    Date for PT Re-Evaluation 12/07/21    Authorization Type 2/10 eval 09/14/21    PT Start Time 0758    PT Stop Time 0841    PT Time Calculation (min) 43 min    Equipment Utilized During Treatment Gait belt    Activity Tolerance Patient tolerated treatment well    Behavior During Therapy [facility] for tasks assessed/performed             Past Medical History:  Diagnosis Date   Arthritis    pt reports in back & knees   Colon polyps 2010   At [facility]   Diverticulitis 2016   Genital warts    GERD (gastroesophageal reflux disease)    Heart murmur    Hepatitis B    History of blood transfusion    during each major surgery per pt   History of chicken pox    History of hiatal hernia    History of torn meniscus of right knee    HTN (hypertension)    Hx of migraine headaches    Hypertension    Hypopotassemia     Past Surgical History:  Procedure Laterality Date   ABDOMINAL HYSTERECTOMY  2006   [place]   COLONOSCOPY WITH PROPOFOL N/A 11/01/2014   Procedure: COLONOSCOPY WITH PROPOFOL;  Surgeon: Manya Silvas, MD;  Location: [facility];  Service: Endoscopy;  Laterality: N/A;   COLONOSCOPY WITH PROPOFOL N/A 01/05/2020   Procedure: COLONOSCOPY WITH PROPOFOL;  Surgeon: [name], MD;  Location: [facility] ENDOSCOPY;  Service: Endoscopy;  Laterality: N/A;   [place]   HERNIA REPAIR  3662   Umbilical   KNEE ARTHROSCOPY Left 2006   torn meniscus   TONSILLECTOMY  1971   TOTAL ABDOMINAL HYSTERECTOMY W/  BILATERAL SALPINGOOPHORECTOMY  2006   VESICOVAGINAL FISTULA CLOSURE W/ TAH      There were no vitals filed for this visit.   Subjective Assessment - 10/17/21 0801     Subjective Patient reports she received botox injection right foot/gastroc region and states feeling okay today.    Pertinent History Terri Werner is a 71 y.o. right-handed female with history of hepatitis B, arthritis, diverticulitis, GERD, hyperlipidemia, hypertension. Patient is recently retired.  She has a daughter and niece in the area.  Presented 05/27/2021 with sudden onset of right leg weakness and mild headache.  Cranial CT scan showed acute parenchymal hemorrhage in the parasagittal left frontal parietal lobes with mild edema.  No substantial mass effect.  CT angiogram head and neck no abnormal vascularity in the region of hemorrhage no hemodynamically significant stenosis.  MRI follow-up showed no mass effect seen underlying the left posterior frontal hematoma. Patient admitted to inpatient rehab on 05/31/21 and discharged on 06/27/21. Patient used home health at home that stopped two weeks ago. Patient has R foot drop. Has a wheelchair she does household chores in, a RW, a quad cane and R AFO    Limitations Walking;Standing;Lifting;House hold activities    How long

## 2021-10-19 ENCOUNTER — Ambulatory Visit: Payer: Medicare Other

## 2021-10-19 DIAGNOSIS — R262 Difficulty in walking, not elsewhere classified: Secondary | ICD-10-CM | POA: Diagnosis not present

## 2021-10-19 DIAGNOSIS — R2681 Unsteadiness on feet: Secondary | ICD-10-CM

## 2021-10-19 DIAGNOSIS — R269 Unspecified abnormalities of gait and mobility: Secondary | ICD-10-CM

## 2021-10-19 DIAGNOSIS — M6281 Muscle weakness (generalized): Secondary | ICD-10-CM | POA: Diagnosis not present

## 2021-10-19 DIAGNOSIS — I619 Nontraumatic intracerebral hemorrhage, unspecified: Secondary | ICD-10-CM | POA: Diagnosis not present

## 2021-10-19 NOTE — Therapy (Signed)
[place] MAIN [facility] SERVICES [address] [place], [place], [address] Phone: [phone]   Fax:  [phone]  Physical Therapy Treatment  Patient Details  Name: Terri Werner MRN: [id number] Date of Birth: 02/17/1951 Referring Provider (PT): [place] Books MD   Encounter Date: 10/19/2021   PT End of Session - 10/19/21 0820     Visit Number 7    Number of Visits 24    Date for PT Re-Evaluation 12/07/21    Authorization Type 7/10 eval 09/14/21    PT Start Time 0811    PT Stop Time 0845    PT Time Calculation (min) 34 min    Equipment Utilized During Treatment Gait belt    Activity Tolerance Patient tolerated treatment well    Behavior During Therapy [facility] for tasks assessed/performed             Past Medical History:  Diagnosis Date   Arthritis    pt reports in back & knees   Colon polyps 2010   At [facility]   Diverticulitis 2016   Genital warts    GERD (gastroesophageal reflux disease)    Heart murmur    Hepatitis B    History of blood transfusion    during each major surgery per pt   History of chicken pox    History of hiatal hernia    History of torn meniscus of right knee    HTN (hypertension)    Hx of migraine headaches    Hypertension    Hypopotassemia     Past Surgical History:  Procedure Laterality Date   ABDOMINAL HYSTERECTOMY  2006   [place]   COLONOSCOPY WITH PROPOFOL N/A 11/01/2014   Procedure: COLONOSCOPY WITH PROPOFOL;  Surgeon: Manya Silvas, MD;  Location: [facility] ENDOSCOPY;  Service: Endoscopy;  Laterality: N/A;   COLONOSCOPY WITH PROPOFOL N/A 01/05/2020   Procedure: COLONOSCOPY WITH PROPOFOL;  Surgeon: [name], MD;  Location: [facility] ENDOSCOPY;  Service: Endoscopy;  Laterality: N/A;   [place]   HERNIA REPAIR  [id number]   Umbilical   KNEE ARTHROSCOPY Left 2006   torn meniscus   TONSILLECTOMY  1971   TOTAL ABDOMINAL HYSTERECTOMY W/  BILATERAL SALPINGOOPHORECTOMY  2006   VESICOVAGINAL FISTULA CLOSURE W/ TAH      There were no vitals filed for this visit.   Subjective Assessment - 10/19/21 0813     Subjective Patient reports she continues to have pain in her back and hip. Has been compliant with HEP. arrived late to PT session.    Pertinent History Terri Werner is a 71 y.o. right-handed female with history of hepatitis B, arthritis, diverticulitis, GERD, hyperlipidemia, hypertension. Patient is recently retired.  She has a daughter and niece in the area.  Presented 05/27/2021 with sudden onset of right leg weakness and mild headache.  Cranial CT scan showed acute parenchymal hemorrhage in the parasagittal left frontal parietal lobes with mild edema.  No substantial mass effect.  CT angiogram head and neck no abnormal vascularity in the region of hemorrhage no hemodynamically significant stenosis.  MRI follow-up showed no mass effect seen underlying the left posterior frontal hematoma. Patient admitted to inpatient rehab on 05/31/21 and discharged on 06/27/21. Patient used home health at home that stopped two weeks ago. Patient has R foot drop. Has a wheelchair she does household chores in, a RW, a quad cane and R AFO    Limitations  region [facility]) 03/05/2018   Lumbar spondylolysis 08/20/2017   Lumbar facet arthropathy 08/20/2017   Lumbar degenerative disc disease 08/20/2017   Chronic constipation 07/30/2017   Primary osteoarthritis involving multiple joints 04/17/2017   Postmenopausal 10/12/2016   Prediabetes 10/12/2016   Lumbago 08/04/2015   Diverticulitis of colon 07/05/2014   Carotid artery disorder ([place]) 04/27/2014   Hyperlipidemia 10/08/2013   Hypertension, benign 11/19/2009    Janna Arch, PT, DPT  10/19/2021, 8:45 AM  [place] MAIN [facility] SERVICES [address] [place], [place], [address] Phone: [phone]   Fax:  [phone]  Name: Terri Werner MRN: [id number] Date of Birth: [birth year]  [place] MAIN [facility] SERVICES [address] [place], [place], [address] Phone: [phone]   Fax:  [phone]  Physical Therapy Treatment  Patient Details  Name: Terri Werner MRN: [id number] Date of Birth: 02/17/1951 Referring Provider (PT): [place] Books MD   Encounter Date: 10/19/2021   PT End of Session - 10/19/21 0820     Visit Number 7    Number of Visits 24    Date for PT Re-Evaluation 12/07/21    Authorization Type 7/10 eval 09/14/21    PT Start Time 0811    PT Stop Time 0845    PT Time Calculation (min) 34 min    Equipment Utilized During Treatment Gait belt    Activity Tolerance Patient tolerated treatment well    Behavior During Therapy [facility] for tasks assessed/performed             Past Medical History:  Diagnosis Date   Arthritis    pt reports in back & knees   Colon polyps 2010   At [facility]   Diverticulitis 2016   Genital warts    GERD (gastroesophageal reflux disease)    Heart murmur    Hepatitis B    History of blood transfusion    during each major surgery per pt   History of chicken pox    History of hiatal hernia    History of torn meniscus of right knee    HTN (hypertension)    Hx of migraine headaches    Hypertension    Hypopotassemia     Past Surgical History:  Procedure Laterality Date   ABDOMINAL HYSTERECTOMY  2006   [place]   COLONOSCOPY WITH PROPOFOL N/A 11/01/2014   Procedure: COLONOSCOPY WITH PROPOFOL;  Surgeon: Manya Silvas, MD;  Location: [facility] ENDOSCOPY;  Service: Endoscopy;  Laterality: N/A;   COLONOSCOPY WITH PROPOFOL N/A 01/05/2020   Procedure: COLONOSCOPY WITH PROPOFOL;  Surgeon: [name], MD;  Location: [facility] ENDOSCOPY;  Service: Endoscopy;  Laterality: N/A;   [place]   HERNIA REPAIR  6962   Umbilical   KNEE ARTHROSCOPY Left 2006   torn meniscus   TONSILLECTOMY  1971   TOTAL ABDOMINAL HYSTERECTOMY W/  BILATERAL SALPINGOOPHORECTOMY  2006   VESICOVAGINAL FISTULA CLOSURE W/ TAH      There were no vitals filed for this visit.   Subjective Assessment - 10/19/21 0813     Subjective Patient reports she continues to have pain in her back and hip. Has been compliant with HEP. arrived late to PT session.    Pertinent History Terri Werner is a 71 y.o. right-handed female with history of hepatitis B, arthritis, diverticulitis, GERD, hyperlipidemia, hypertension. Patient is recently retired.  She has a daughter and niece in the area.  Presented 05/27/2021 with sudden onset of right leg weakness and mild headache.  Cranial CT scan showed acute parenchymal hemorrhage in the parasagittal left frontal parietal lobes with mild edema.  No substantial mass effect.  CT angiogram head and neck no abnormal vascularity in the region of hemorrhage no hemodynamically significant stenosis.  MRI follow-up showed no mass effect seen underlying the left posterior frontal hematoma. Patient admitted to inpatient rehab on 05/31/21 and discharged on 06/27/21. Patient used home health at home that stopped two weeks ago. Patient has R foot drop. Has a wheelchair she does household chores in, a RW, a quad cane and R AFO    Limitations  region [facility]) 03/05/2018   Lumbar spondylolysis 08/20/2017   Lumbar facet arthropathy 08/20/2017   Lumbar degenerative disc disease 08/20/2017   Chronic constipation 07/30/2017   Primary osteoarthritis involving multiple joints 04/17/2017   Postmenopausal 10/12/2016   Prediabetes 10/12/2016   Lumbago 08/04/2015   Diverticulitis of colon 07/05/2014   Carotid artery disorder ([place]) 04/27/2014   Hyperlipidemia 10/08/2013   Hypertension, benign 11/19/2009    Janna Arch, PT, DPT  10/19/2021, 8:45 AM  [place] MAIN [facility] SERVICES [address] [place], [place], [address] Phone: [phone]   Fax:  [phone]  Name: Terri Werner MRN: [id number] Date of Birth: [birth year]

## 2021-10-24 ENCOUNTER — Ambulatory Visit: Payer: Medicare Other | Admitting: Urology

## 2021-10-24 ENCOUNTER — Ambulatory Visit: Payer: Medicare Other

## 2021-10-24 DIAGNOSIS — R262 Difficulty in walking, not elsewhere classified: Secondary | ICD-10-CM

## 2021-10-24 DIAGNOSIS — M6281 Muscle weakness (generalized): Secondary | ICD-10-CM

## 2021-10-24 DIAGNOSIS — R269 Unspecified abnormalities of gait and mobility: Secondary | ICD-10-CM | POA: Diagnosis not present

## 2021-10-24 DIAGNOSIS — R2681 Unsteadiness on feet: Secondary | ICD-10-CM

## 2021-10-24 DIAGNOSIS — I619 Nontraumatic intracerebral hemorrhage, unspecified: Secondary | ICD-10-CM | POA: Diagnosis not present

## 2021-10-24 NOTE — Therapy (Signed)
[place] [facility] MAIN [facility] SERVICES [address] [place], [place], [address] Phone: [phone]   Fax:  [phone]  Physical Therapy Treatment  Patient Details  Name: Terri Werner MRN: [id number] Date of Birth: Sep 12, 1950 Referring Provider (PT): Cristopher Peru MD   Encounter Date: 10/24/2021   PT End of Session - 10/24/21 0805     Visit Number 8    Number of Visits 24    Date for PT Re-Evaluation 12/07/21    Authorization Type 8/10 eval 09/14/21    PT Start Time 0800    PT Stop Time 0845    PT Time Calculation (min) 45 min    Equipment Utilized During Treatment Gait belt    Activity Tolerance Patient tolerated treatment well    Behavior During Therapy [facility] for tasks assessed/performed             Past Medical History:  Diagnosis Date   Arthritis    pt reports in back & knees   Colon polyps 2010   At [facility]   Diverticulitis 2016   Genital warts    GERD (gastroesophageal reflux disease)    Heart murmur    Hepatitis B    History of blood transfusion    during each major surgery per pt   History of chicken pox    History of hiatal hernia    History of torn meniscus of right knee    HTN (hypertension)    Hx of migraine headaches    Hypertension    Hypopotassemia     Past Surgical History:  Procedure Laterality Date   ABDOMINAL HYSTERECTOMY  2006   APPENDECTOMY  1980   CESAREAN SECTION  1981   COLONOSCOPY WITH PROPOFOL N/A 11/01/2014   Procedure: COLONOSCOPY WITH PROPOFOL;  Surgeon: Scot Jun, MD;  Location: [facility] ENDOSCOPY;  Service: Endoscopy;  Laterality: N/A;   COLONOSCOPY WITH PROPOFOL N/A 01/05/2020   Procedure: COLONOSCOPY WITH PROPOFOL;  Surgeon: Regis Bill, MD;  Location: [facility] ENDOSCOPY;  Service: Endoscopy;  Laterality: N/A;   ECTOPIC PREGNANCY SURGERY  1980   HERNIA REPAIR  2006   [place]   KNEE ARTHROSCOPY Left 2006   torn meniscus   TONSILLECTOMY  1971   TOTAL ABDOMINAL HYSTERECTOMY W/  BILATERAL SALPINGOOPHORECTOMY  2006   VESICOVAGINAL FISTULA CLOSURE W/ TAH      There were no vitals filed for this visit.   Subjective Assessment - 10/24/21 0803     Subjective Patient reports feeling stiff after last session and sore but no pain. Reports compliance with HEP.    Pertinent History Terri Werner is a 71 y.o. right-handed female with history of hepatitis B, arthritis, diverticulitis, GERD, hyperlipidemia, hypertension. Patient is recently retired.  She has a daughter and niece in the area.  Presented 05/27/2021 with sudden onset of right leg weakness and mild headache.  Cranial CT scan showed acute parenchymal hemorrhage in the parasagittal left frontal parietal lobes with mild edema.  No substantial mass effect.  CT angiogram head and neck no abnormal vascularity in the region of hemorrhage no hemodynamically significant stenosis.  MRI follow-up showed no mass effect seen underlying the left posterior frontal hematoma. Patient admitted to inpatient rehab on 05/31/21 and discharged on 06/27/21. Patient used home health at home that stopped two weeks ago. Patient has R foot drop. Has a wheelchair she does household chores in, a RW, a quad cane and R AFO    Limitations Walking;Standing;Lifting;House hold activities  How long can you sit comfortably? several hours    How long can you stand comfortably? uses walker for safety    How long can you walk comfortably? uses walker, uses motorized chair at grocery store    Patient Stated Goals restore ability to walk, live alone and drive car.    Currently in Pain? Yes    Pain Score 4     Pain Location Hip    Pain Orientation Right    Pain Descriptors / Indicators Aching    Pain Type Chronic pain    Pain Onset More than a month ago    Pain Frequency Constant                  INTERVENTIONS:    Neuromuscular re-ed:  -step tap onto 6" step box- Alt LE - difficulty with placement x 20 reps each LE   -Side step up  and over orange hurdle with BUE support x 15 reps each side.   -forward step over and back orange hurdle SUE support 10x each LE, very challenging RLE   Airex pad horizontal and vertical head turns scanning for letters while playing word game for dual task   ambulate with SPC 90 ft; close CGA   Therex:     Nustep Lvl 4 seat position 8; 4 minutes Spm > 60 for cardiovascular and musculoskeletal challenge  Seated with # 4lb ankle weights  -Seated marches with upright posture, back away from back of chair for abdominal/trunk activation/stabilization, 10x each LE -Seated LAQ with 3 second holds, 10x each LE, cueing for muscle activation and sequencing for neutral alignment -Seated IR/ER with cueing for stabilizing knee placement with lateral foot movement for optimal muscle recruitment, 10x each LE  Seated adduction ball squeeze 10x 3 second holds  Standing with 4lb ankle weights: -marching 10x each LE; BUE support  -hip extension 10x each LE BUE support    Education provided throughout session via VC/TC and demonstration to facilitate movement at target joints and correct muscle activation for all testing and exercises performed.    Patient tolerates increased resistance for seated strengthening. Her stabilization continues to improve as can be seen in utilization of [facility] during PT session. Patient's shoes are slightly too large causing her to trip over herself occasionally. She would benefit from additional skilled PT Intervention to improve strength, balance and mobility for decreased risk of falling and improved quality of life.                   PT Education - 10/24/21 0804     Education provided Yes    Education Details exercise technique, body mechanics    Person(s) Educated Patient    Methods Explanation;Demonstration;Tactile cues;Verbal cues    Comprehension Verbalized understanding;Returned demonstration;Verbal cues required;Tactile cues required               PT Short Term Goals - 09/14/21 0908       PT SHORT TERM GOAL #1   Title Patient will be independent in home exercise program to improve strength/mobility for better functional independence with ADLs.    Baseline 5/4: HEP given    Time 4    Period Weeks    Status New    Target Date 10/12/21               PT Long Term Goals - 09/14/21 0908       PT LONG TERM GOAL #1   Title Patient will increase FOTO  score to equal to or greater than 63%    to demonstrate statistically significant improvement in mobility and quality of life.    Baseline 5/4: 48%    Time 12    Period Weeks    Status New    Target Date 12/07/21      PT LONG TERM GOAL #2   Title Patient will increase 10 meter walk test to >1.20m/s as to improve gait speed for better community ambulation and to reduce fall risk.    Baseline 5/4: 0.54 m/s with RW    Time 12    Period Weeks    Status New    Target Date 12/07/21      PT LONG TERM GOAL #3   Title Patient will demonstrate an improved Berg Balance Score of >45/56 as to demonstrate improved balance with ADLs such as sitting/standing and transfer balance and reduced fall risk.    Baseline 5/4: 32/56    Time 12    Period Weeks    Status New    Target Date 12/07/21      PT LONG TERM GOAL #4   Title Patient will increase BLE gross strength to 4+/5 as to improve functional strength for independent gait, increased standing tolerance and increased ADL ability.    Baseline 5/4: see note    Time 12    Period Weeks    Status New    Target Date 12/07/21                   Plan - 10/24/21 0834     Clinical Impression Statement Patient tolerates increased resistance for seated strengthening. Her stabilization continues to improve as can be seen in utilization of [facility] during PT session. Patient's shoes are slightly too large causing her to trip over herself occasionally. She would benefit from additional skilled PT Intervention to improve strength, balance  and mobility for decreased risk of falling and improved quality of life.    Personal Factors and Comorbidities Comorbidity 3+;Fitness;Past/Current Experience;Time since onset of injury/illness/exacerbation;Transportation    Comorbidities hepatitis B, arthritis, diverticulitis, GERD, hyperlipidemia, hypertension    Examination-Activity Limitations Bed Mobility;Caring for [employer];Locomotion Level;Lift;Squat;Stairs;Stand;Toileting;Transfers    Examination-Participation Restrictions Cleaning;Driving;Meal Prep;Laundry;Shop;Volunteer;Yard Work    Conservation officer, historic buildings Evolving/Moderate complexity    Rehab Potential Fair    PT Frequency 2x / week    PT Duration 12 weeks    PT Treatment/Interventions ADLs/Self Care Home Management;Canalith Repostioning;Cryotherapy;Electrical Stimulation;Iontophoresis 4mg /ml Dexamethasone;Moist Heat;Traction;Ultrasound;Functional mobility training;Stair training;Gait training;DME Instruction;Therapeutic activities;Therapeutic exercise;Balance training;Neuromuscular re-education;Manual techniques;Orthotic Fit/Training;Patient/family education;Passive range of motion;Dry needling;Taping;Splinting;Energy conservation;Visual/perceptual remediation/compensation;Vestibular    PT Next Visit Plan progress stabilization    PT Home Exercise Plan see above    Consulted and Agree with Plan of Care Patient             Patient will benefit from skilled therapeutic intervention in order to improve the following deficits and impairments:  Abnormal gait, Decreased activity tolerance, Decreased balance, Decreased endurance, Decreased coordination, Decreased mobility, Difficulty walking, Decreased strength, Impaired flexibility, Impaired perceived functional ability, Postural dysfunction, Improper body mechanics  Visit Diagnosis: Muscle weakness (generalized)  Unsteadiness on feet  Difficulty in walking, not elsewhere  classified  Abnormality of gait and mobility     Problem List Patient Active Problem List   Diagnosis Date Noted   Hx of spontaneous intraparenchymal intracranial hemorrhage 07/12/2021   Intraparenchymal hemorrhage of brain (HCC) 05/31/2021   ICH (intracerebral hemorrhage) (HCC) 05/27/2021   Morbid obesity (HCC) 09/24/2018   Unspecified inflammatory  spondylopathy, lumbar region (HCC) 03/05/2018   Lumbar spondylolysis 08/20/2017   Lumbar facet arthropathy 08/20/2017   Lumbar degenerative disc disease 08/20/2017   Chronic constipation 07/30/2017   Primary osteoarthritis involving multiple joints 04/17/2017   Postmenopausal 10/12/2016   Prediabetes 10/12/2016   Lumbago 08/04/2015   Diverticulitis of colon 07/05/2014   Carotid artery disorder (HCC) 04/27/2014   Hyperlipidemia 10/08/2013   Hypertension, benign 11/19/2009    Precious Bard, PT, DPT  10/24/2021, 8:45 AM  [facility] MAIN [facility] SERVICES [address] [place], [place], [address] Phone: [phone]   Fax:  [phone]  Name: JADELIN WIMS MRN: [id number] Date of Birth: [birth year]

## 2021-10-25 ENCOUNTER — Ambulatory Visit
Admission: RE | Admit: 2021-10-25 | Discharge: 2021-10-25 | Disposition: A | Discharge status: Home / Self Care | Payer: Medicare Other | Source: Ambulatory Visit | Attending: Urology | Admitting: Urology

## 2021-10-25 DIAGNOSIS — N3020 Other chronic cystitis without hematuria: Secondary | ICD-10-CM | POA: Insufficient documentation

## 2021-10-25 DIAGNOSIS — N321 Vesicointestinal fistula: Secondary | ICD-10-CM | POA: Insufficient documentation

## 2021-10-25 DIAGNOSIS — N3289 Other specified disorders of bladder: Secondary | ICD-10-CM | POA: Diagnosis not present

## 2021-10-25 DIAGNOSIS — K5730 Diverticulosis of large intestine without perforation or abscess without bleeding: Secondary | ICD-10-CM | POA: Diagnosis not present

## 2021-10-25 DIAGNOSIS — K429 Umbilical hernia without obstruction or gangrene: Secondary | ICD-10-CM | POA: Diagnosis not present

## 2021-10-25 DIAGNOSIS — K439 Ventral hernia without obstruction or gangrene: Secondary | ICD-10-CM | POA: Diagnosis not present

## 2021-10-25 LAB — POCT I-STAT CREATININE: Creatinine, Ser: 1.1 mg/dL — ABNORMAL HIGH (ref 0.44–1.00)

## 2021-10-25 MED ORDER — IOHEXOL 300 MG/ML  SOLN
100.0000 mL | Freq: Once | INTRAMUSCULAR | Status: AC | PRN
  Administered 2021-10-25: 100 mL via INTRAVENOUS

## 2021-10-26 ENCOUNTER — Ambulatory Visit: Payer: Medicare Other

## 2021-10-26 DIAGNOSIS — R2681 Unsteadiness on feet: Secondary | ICD-10-CM

## 2021-10-26 DIAGNOSIS — M6281 Muscle weakness (generalized): Secondary | ICD-10-CM

## 2021-10-26 DIAGNOSIS — R269 Unspecified abnormalities of gait and mobility: Secondary | ICD-10-CM

## 2021-10-26 DIAGNOSIS — R262 Difficulty in walking, not elsewhere classified: Secondary | ICD-10-CM | POA: Diagnosis not present

## 2021-10-26 DIAGNOSIS — I619 Nontraumatic intracerebral hemorrhage, unspecified: Secondary | ICD-10-CM | POA: Diagnosis not present

## 2021-10-26 NOTE — Therapy (Signed)
disorder ([place]) 04/27/2014   Hyperlipidemia 10/08/2013   Hypertension, benign 11/19/2009   Janna Arch, PT, DPT  10/26/2021, 8:45 AM  [place] MAIN [facility] SERVICES [address] [place], [place], [address] Phone: [phone]   Fax:  [phone]  Name: Terri Werner MRN: [id number] Date of Birth: [birth year]  disorder ([place]) 04/27/2014   Hyperlipidemia 10/08/2013   Hypertension, benign 11/19/2009   Janna Arch, PT, DPT  10/26/2021, 8:45 AM  [place] MAIN [facility] SERVICES [address] [place], [place], [address] Phone: [phone]   Fax:  [phone]  Name: Terri Werner MRN: [id number] Date of Birth: [birth year]  [place] MAIN [facility] SERVICES [address] [place], [place], [address] Phone: [phone]   Fax:  [phone]  Physical Therapy Treatment  Patient Details  Name: Terri Werner MRN: [id number] Date of Birth: 1951/01/06 Referring Provider (PT): [place] Books MD   Encounter Date: 10/26/2021   PT End of Session - 10/26/21 0744     Visit Number 9    Number of Visits 24    Date for PT Re-Evaluation 12/07/21    Authorization Type 9/10 eval 09/14/21    PT Start Time 0758    PT Stop Time 0843    PT Time Calculation (min) 45 min    Equipment Utilized During Treatment Gait belt    Activity Tolerance Patient tolerated treatment well    Behavior During Therapy [facility] for tasks assessed/performed             Past Medical History:  Diagnosis Date   Arthritis    pt reports in back & knees   Colon polyps 2010   At [facility]   Diverticulitis 2016   Genital warts    GERD (gastroesophageal reflux disease)    Heart murmur    Hepatitis B    History of blood transfusion    during each major surgery per pt   History of chicken pox    History of hiatal hernia    History of torn meniscus of right knee    HTN (hypertension)    Hx of migraine headaches    Hypertension    Hypopotassemia     Past Surgical History:  Procedure Laterality Date   ABDOMINAL HYSTERECTOMY  2006   [place]   COLONOSCOPY WITH PROPOFOL N/A 11/01/2014   Procedure: COLONOSCOPY WITH PROPOFOL;  Surgeon: Manya Silvas, MD;  Location: [place];  Service: Endoscopy;  Laterality: N/A;   COLONOSCOPY WITH PROPOFOL N/A 01/05/2020   Procedure: COLONOSCOPY WITH PROPOFOL;  Surgeon: [name], MD;  Location: [facility] ENDOSCOPY;  Service: Endoscopy;  Laterality: N/A;   [place]   HERNIA REPAIR  5956   Umbilical   KNEE ARTHROSCOPY Left 2006   torn meniscus   TONSILLECTOMY  1971   TOTAL ABDOMINAL HYSTERECTOMY W/  BILATERAL SALPINGOOPHORECTOMY  2006   VESICOVAGINAL FISTULA CLOSURE W/ TAH      There were no vitals filed for this visit.   Subjective Assessment - 10/26/21 0800     Subjective Patient had a cystogram yesterday, reports feeling "rough" after. Feels very stiff this morning.    Pertinent History Terri Werner is a 71 y.o. right-handed female with history of hepatitis B, arthritis, diverticulitis, GERD, hyperlipidemia, hypertension. Patient is recently retired.  She has a daughter and niece in the area.  Presented 05/27/2021 with sudden onset of right leg weakness and mild headache.  Cranial CT scan showed acute parenchymal hemorrhage in the parasagittal left frontal parietal lobes with mild edema.  No substantial mass effect.  CT angiogram head and neck no abnormal vascularity in the region of hemorrhage no hemodynamically significant stenosis.  MRI follow-up showed no mass effect seen underlying the left posterior frontal hematoma. Patient admitted to inpatient rehab on 05/31/21 and discharged on 06/27/21. Patient used home health at home that stopped two weeks ago. Patient has R foot drop. Has a wheelchair she does household chores in, a RW, a quad cane and R AFO    Limitations Walking;Standing;Lifting;House hold activities    How long  disorder ([place]) 04/27/2014   Hyperlipidemia 10/08/2013   Hypertension, benign 11/19/2009   Janna Arch, PT, DPT  10/26/2021, 8:45 AM  [place] MAIN [facility] SERVICES [address] [place], [place], [address] Phone: [phone]   Fax:  [phone]  Name: Terri Werner MRN: [id number] Date of Birth: [birth year]

## 2021-10-31 ENCOUNTER — Encounter: Payer: Self-pay | Admitting: Physical Therapy

## 2021-10-31 ENCOUNTER — Ambulatory Visit: Payer: Medicare Other | Admitting: Physical Therapy

## 2021-10-31 DIAGNOSIS — R269 Unspecified abnormalities of gait and mobility: Secondary | ICD-10-CM | POA: Diagnosis not present

## 2021-10-31 DIAGNOSIS — M6281 Muscle weakness (generalized): Secondary | ICD-10-CM

## 2021-10-31 DIAGNOSIS — R262 Difficulty in walking, not elsewhere classified: Secondary | ICD-10-CM | POA: Diagnosis not present

## 2021-10-31 DIAGNOSIS — R2681 Unsteadiness on feet: Secondary | ICD-10-CM

## 2021-10-31 DIAGNOSIS — I619 Nontraumatic intracerebral hemorrhage, unspecified: Secondary | ICD-10-CM | POA: Diagnosis not present

## 2021-10-31 NOTE — Therapy (Addendum)
[place] MAIN [facility] SERVICES [address] [place], [place], [address] Phone: [phone]   Fax:  [phone]  Physical Therapy Treatment Physical Therapy Progress Note   Dates of reporting period  09/14/21   to   10/31/21   Patient Details  Name: Terri Werner MRN: [id number] Date of Birth: 06-23-1950 Referring Provider (PT): Jennings Books MD   Encounter Date: 10/31/2021   PT End of Session - 10/31/21 0854     Visit Number 10    Number of Visits 24    Date for PT Re-Evaluation 12/07/21    Authorization Type 9/10 eval 09/14/21    PT Start Time 0848    PT Stop Time 0930    PT Time Calculation (min) 42 min    Equipment Utilized During Treatment Gait belt    Activity Tolerance Patient tolerated treatment well    Behavior During Therapy [facility] for tasks assessed/performed             Past Medical History:  Diagnosis Date   Arthritis    pt reports in back & knees   Colon polyps 2010   At [facility]   Diverticulitis 2016   Genital warts    GERD (gastroesophageal reflux disease)    Heart murmur    Hepatitis B    History of blood transfusion    during each major surgery per pt   History of chicken pox    History of hiatal hernia    History of torn meniscus of right knee    HTN (hypertension)    Hx of migraine headaches    Hypertension    Hypopotassemia     Past Surgical History:  Procedure Laterality Date   ABDOMINAL HYSTERECTOMY  2006   [place]   COLONOSCOPY WITH PROPOFOL N/A 11/01/2014   Procedure: COLONOSCOPY WITH PROPOFOL;  Surgeon: Manya Silvas, MD;  Location: [facility] ENDOSCOPY;  Service: Endoscopy;  Laterality: N/A;   COLONOSCOPY WITH PROPOFOL N/A 01/05/2020   Procedure: COLONOSCOPY WITH PROPOFOL;  Surgeon: [name], MD;  Location: [facility] ENDOSCOPY;  Service: Endoscopy;  Laterality: N/A;   [place]   HERNIA REPAIR  [id number]   Umbilical   KNEE  ARTHROSCOPY Left 2006   torn meniscus   TONSILLECTOMY  1971   TOTAL ABDOMINAL HYSTERECTOMY W/ BILATERAL SALPINGOOPHORECTOMY  2006   VESICOVAGINAL FISTULA CLOSURE W/ TAH      There were no vitals filed for this visit.   Subjective Assessment - 10/31/21 0852     Subjective Patient reports doing okay. She reports her right knee is a little sore this morning. She denies any falls/stumbles. Pt reports walking without AD at home but does use RW when coming to therapy. She reports adherence with HEP at least 2 days a week    Pertinent History Terri Werner is a 71 y.o. right-handed female with history of hepatitis B, arthritis, diverticulitis, GERD, hyperlipidemia, hypertension. Patient is recently retired.  She has a daughter and niece in the area.  Presented 05/27/2021 with sudden onset of right leg weakness and mild headache.  Cranial CT scan showed acute parenchymal hemorrhage in the parasagittal left frontal parietal lobes with mild edema.  No substantial mass effect.  CT angiogram head and neck no abnormal vascularity in the region of hemorrhage no hemodynamically significant stenosis.  MRI follow-up showed no mass effect seen underlying the left posterior frontal hematoma. Patient admitted to inpatient rehab  [place] MAIN [facility] [address] [place], [place], [address] Phone: [phone]   Fax:  [phone]  Physical Therapy Treatment Physical Therapy Progress Note   Dates of reporting period  09/14/21   to   10/31/21   Patient Details  Name: Terri Werner MRN: [id number] Date of Birth: 06-23-1950 Referring Provider (PT): Jennings Books MD   Encounter Date: 10/31/2021   PT End of Session - 10/31/21 0854     Visit Number 10    Number of Visits 24    Date for PT Re-Evaluation 12/07/21    Authorization Type 9/10 eval 09/14/21    PT Start Time 0848    PT Stop Time 0930    PT Time Calculation (min) 42 min    Equipment Utilized During Treatment Gait belt    Activity Tolerance Patient tolerated treatment well    Behavior During Therapy [facility] for tasks assessed/performed             Past Medical History:  Diagnosis Date   Arthritis    pt reports in back & knees   Colon polyps 2010   At [facility]   Diverticulitis 2016   Genital warts    GERD (gastroesophageal reflux disease)    Heart murmur    Hepatitis B    History of blood transfusion    during each major surgery per pt   History of chicken pox    History of hiatal hernia    History of torn meniscus of right knee    HTN (hypertension)    Hx of migraine headaches    Hypertension    Hypopotassemia     Past Surgical History:  Procedure Laterality Date   ABDOMINAL HYSTERECTOMY  2006   [place]   COLONOSCOPY WITH PROPOFOL N/A 11/01/2014   Procedure: COLONOSCOPY WITH PROPOFOL;  Surgeon: Manya Silvas, MD;  Location: [facility] ENDOSCOPY;  Service: Endoscopy;  Laterality: N/A;   COLONOSCOPY WITH PROPOFOL N/A 01/05/2020   Procedure: COLONOSCOPY WITH PROPOFOL;  Surgeon: [name], MD;  Location: [facility] ENDOSCOPY;  Service: Endoscopy;  Laterality: N/A;   [place]   HERNIA REPAIR  5797   Umbilical   KNEE  ARTHROSCOPY Left 2006   torn meniscus   TONSILLECTOMY  1971   TOTAL ABDOMINAL HYSTERECTOMY W/ BILATERAL SALPINGOOPHORECTOMY  2006   VESICOVAGINAL FISTULA CLOSURE W/ TAH      There were no vitals filed for this visit.   Subjective Assessment - 10/31/21 0852     Subjective Patient reports doing okay. She reports her right knee is a little sore this morning. She denies any falls/stumbles. Pt reports walking without AD at home but does use RW when coming to therapy. She reports adherence with HEP at least 2 days a week    Pertinent History Terri Werner is a 71 y.o. right-handed female with history of hepatitis B, arthritis, diverticulitis, GERD, hyperlipidemia, hypertension. Patient is recently retired.  She has a daughter and niece in the area.  Presented 05/27/2021 with sudden onset of right leg weakness and mild headache.  Cranial CT scan showed acute parenchymal hemorrhage in the parasagittal left frontal parietal lobes with mild edema.  No substantial mass effect.  CT angiogram head and neck no abnormal vascularity in the region of hemorrhage no hemodynamically significant stenosis.  MRI follow-up showed no mass effect seen underlying the left posterior frontal hematoma. Patient admitted to inpatient rehab  Patient will increase BLE gross strength to 4+/5 as to improve functional strength for independent gait, increased standing tolerance and increased ADL ability.    Baseline 5/4: see note, 6/20:R knee 4/5    Time 12    Period Weeks    Status Partially Met    Target Date 12/07/21                   Plan - 10/31/21 0925     Clinical Impression Statement Patient motivated and participated well within session. She was instructed in outcome measures to address progress towards goals. Patient does exhibit improvement in transfer and balance. She is still walking at slower gait speed. Patient does report increased RLE knee pain today and continues to have spastic hemiplegia. Unsure if knee pain is exacerbated from wet weather or if it is part of CVA. patient also continues to have weakness in RLE. She would benefit from additional skilled PT  intervention to improve strength, balance and mobility;    Personal Factors and Comorbidities Comorbidity 3+;Fitness;Past/Current Experience;Time since onset of injury/illness/exacerbation;Transportation    Comorbidities hepatitis B, arthritis, diverticulitis, GERD, hyperlipidemia, hypertension    Examination-Activity Limitations Bed Mobility;Caring for [employer];Locomotion Level;Lift;Squat;Stairs;Stand;Toileting;Transfers    Examination-Participation Restrictions Cleaning;Driving;Meal Prep;Laundry;Shop;Volunteer;Yard Work    Merchant navy officer Evolving/Moderate complexity    Rehab Potential Fair    PT Frequency 2x / week    PT Duration 12 weeks    PT Treatment/Interventions ADLs/Self Care Home Management;Canalith Repostioning;Cryotherapy;Electrical Stimulation;Iontophoresis 62m/ml Dexamethasone;Moist Heat;Traction;Ultrasound;Functional mobility training;Stair training;Gait training;DME Instruction;Therapeutic activities;Therapeutic exercise;Balance training;Neuromuscular re-education;Manual techniques;Orthotic Fit/Training;Patient/family education;Passive range of motion;Dry needling;Taping;Splinting;Energy conservation;Visual/perceptual remediation/compensation;Vestibular    PT Next Visit Plan progress stabilization    PT Home Exercise Plan see above    Consulted and Agree with Plan of Care Patient             Patient will benefit from skilled therapeutic intervention in order to improve the following deficits and impairments:  Abnormal gait, Decreased activity tolerance, Decreased balance, Decreased endurance, Decreased coordination, Decreased mobility, Difficulty walking, Decreased strength, Impaired flexibility, Impaired perceived functional ability, Postural dysfunction, Improper body mechanics  Visit Diagnosis: Muscle weakness (generalized)  Unsteadiness on feet  Difficulty in walking, not elsewhere classified  Abnormality of gait and  mobility     Problem List Patient Active Problem List   Diagnosis Date Noted   Hx of spontaneous intraparenchymal intracranial hemorrhage 07/12/2021   Intraparenchymal hemorrhage of brain ([facility] 05/31/2021   ICH (intracerebral hemorrhage) ([facility] 05/27/2021   Morbid obesity ([facility] 09/24/2018   Unspecified inflammatory spondylopathy, lumbar region ([facility] 03/05/2018   Lumbar spondylolysis 08/20/2017   Lumbar facet arthropathy 08/20/2017   Lumbar degenerative disc disease 08/20/2017   Chronic constipation 07/30/2017   Primary osteoarthritis involving multiple joints 04/17/2017   Postmenopausal 10/12/2016   Prediabetes 10/12/2016   Lumbago 08/04/2015   Diverticulitis of colon 07/05/2014   Carotid artery disorder ([facility] 04/27/2014   Hyperlipidemia 10/08/2013   Hypertension, benign 11/19/2009    Waver Dibiasio, PT, DPT 10/31/2021, 9:28 AM  [address] [address]Phone: [phone]  Fax:  [phone] Name: Terri LIPSKYMRN: [id number] of Birth: [birth year]  Patient will increase BLE gross strength to 4+/5 as to improve functional strength for independent gait, increased standing tolerance and increased ADL ability.    Baseline 5/4: see note, 6/20:R knee 4/5    Time 12    Period Weeks    Status Partially Met    Target Date 12/07/21                   Plan - 10/31/21 0925     Clinical Impression Statement Patient motivated and participated well within session. She was instructed in outcome measures to address progress towards goals. Patient does exhibit improvement in transfer and balance. She is still walking at slower gait speed. Patient does report increased RLE knee pain today and continues to have spastic hemiplegia. Unsure if knee pain is exacerbated from wet weather or if it is part of CVA. patient also continues to have weakness in RLE. She would benefit from additional skilled PT  intervention to improve strength, balance and mobility;    Personal Factors and Comorbidities Comorbidity 3+;Fitness;Past/Current Experience;Time since onset of injury/illness/exacerbation;Transportation    Comorbidities hepatitis B, arthritis, diverticulitis, GERD, hyperlipidemia, hypertension    Examination-Activity Limitations Bed Mobility;Caring for [employer];Locomotion Level;Lift;Squat;Stairs;Stand;Toileting;Transfers    Examination-Participation Restrictions Cleaning;Driving;Meal Prep;Laundry;Shop;Volunteer;Yard Work    Merchant navy officer Evolving/Moderate complexity    Rehab Potential Fair    PT Frequency 2x / week    PT Duration 12 weeks    PT Treatment/Interventions ADLs/Self Care Home Management;Canalith Repostioning;Cryotherapy;Electrical Stimulation;Iontophoresis 62m/ml Dexamethasone;Moist Heat;Traction;Ultrasound;Functional mobility training;Stair training;Gait training;DME Instruction;Therapeutic activities;Therapeutic exercise;Balance training;Neuromuscular re-education;Manual techniques;Orthotic Fit/Training;Patient/family education;Passive range of motion;Dry needling;Taping;Splinting;Energy conservation;Visual/perceptual remediation/compensation;Vestibular    PT Next Visit Plan progress stabilization    PT Home Exercise Plan see above    Consulted and Agree with Plan of Care Patient             Patient will benefit from skilled therapeutic intervention in order to improve the following deficits and impairments:  Abnormal gait, Decreased activity tolerance, Decreased balance, Decreased endurance, Decreased coordination, Decreased mobility, Difficulty walking, Decreased strength, Impaired flexibility, Impaired perceived functional ability, Postural dysfunction, Improper body mechanics  Visit Diagnosis: Muscle weakness (generalized)  Unsteadiness on feet  Difficulty in walking, not elsewhere classified  Abnormality of gait and  mobility     Problem List Patient Active Problem List   Diagnosis Date Noted   Hx of spontaneous intraparenchymal intracranial hemorrhage 07/12/2021   Intraparenchymal hemorrhage of brain ([facility] 05/31/2021   ICH (intracerebral hemorrhage) ([facility] 05/27/2021   Morbid obesity ([facility] 09/24/2018   Unspecified inflammatory spondylopathy, lumbar region ([facility] 03/05/2018   Lumbar spondylolysis 08/20/2017   Lumbar facet arthropathy 08/20/2017   Lumbar degenerative disc disease 08/20/2017   Chronic constipation 07/30/2017   Primary osteoarthritis involving multiple joints 04/17/2017   Postmenopausal 10/12/2016   Prediabetes 10/12/2016   Lumbago 08/04/2015   Diverticulitis of colon 07/05/2014   Carotid artery disorder ([facility] [place] [place] 04/27/2014   Hyperlipidemia 10/08/2013   Hypertension, benign 11/19/2009    Waver Dibiasio, PT, DPT 10/31/2021, 9:28 AM  [address] [address]Phone: [phone]  Fax:  [phone] Name: Terri LIPSKYMRN: [id number] of Birth: [birth year]

## 2021-11-01 ENCOUNTER — Ambulatory Visit (INDEPENDENT_AMBULATORY_CARE_PROVIDER_SITE_OTHER): Payer: Medicare Other | Admitting: Urology

## 2021-11-01 ENCOUNTER — Encounter: Payer: Self-pay | Admitting: Urology

## 2021-11-01 VITALS — BP 143/80 | HR 80 | Ht 62.0 in | Wt 236.0 lb

## 2021-11-01 DIAGNOSIS — R3989 Other symptoms and signs involving the genitourinary system: Secondary | ICD-10-CM

## 2021-11-01 NOTE — Progress Notes (Signed)
11/01/2021 2:42 PM   Terri Werner Mar 26, 1951 [id number]  Referring provider:  Margarita Mail, DO [address] [place],  [place] [address] Chief Complaint  Patient presents with   Follow-up    CT-cystogram results     HPI: Terri Werner is a 71 y.o.female with a personal history of voiding dysfunction who presents today for further evaluation of possible fistula and CT/cystogram results.   She was last seen by Dr Sherron Monday on 10/02/2021. She underwent a pelvic exam and cystoscopy. Exam was challenging due to mobility and habitus but there was no significant prolapse or stress incontinence. Cystoscopy a small protrusion of tissues in the midline at dome with no obvious opening, that looked consistent with a fistula.   She underwent a CT abdomen and pelvis on 10/10/2021 to further evaluate. It visualized no definite fistula between bowel and the urinary bladder is identified, however there is a small amount of gas in the urinary bladder.   CT cystogram on 10/25/2021 visualized no evidence of bladder fistula.   She reports that her symptoms have improved.  She has not had an episode of pneumaturia since last week and the pneumaturia is becoming less obvious.  She no longer has debris in her urine.  She denies any abdominal pain or bowel issues.  She is regular and if not, will take MiraLAX.  PMH: Past Medical History:  Diagnosis Date   Arthritis    pt reports in back & knees   Colon polyps 2010   At [facility]   Diverticulitis 2016   Genital warts    GERD (gastroesophageal reflux disease)    Heart murmur    Hepatitis B    History of blood transfusion    during each major surgery per pt   History of chicken pox    History of hiatal hernia    History of torn meniscus of right knee    HTN (hypertension)    Hx of migraine headaches    Hypertension    Hypopotassemia     Surgical History: Past Surgical History:  Procedure Laterality  Date   ABDOMINAL HYSTERECTOMY  2006   APPENDECTOMY  1980   CESAREAN SECTION  1981   COLONOSCOPY WITH PROPOFOL N/A 11/01/2014   Procedure: COLONOSCOPY WITH PROPOFOL;  Surgeon: Scot Jun, MD;  Location: [facility] ENDOSCOPY;  Service: Endoscopy;  Laterality: N/A;   COLONOSCOPY WITH PROPOFOL N/A 01/05/2020   Procedure: COLONOSCOPY WITH PROPOFOL;  Surgeon: Regis Bill, MD;  Location: [facility] ENDOSCOPY;  Service: Endoscopy;  Laterality: N/A;   ECTOPIC PREGNANCY SURGERY  1980   HERNIA REPAIR  2006   Umbilical   KNEE ARTHROSCOPY Left 2006   torn meniscus   TONSILLECTOMY  1971   TOTAL ABDOMINAL HYSTERECTOMY W/ BILATERAL SALPINGOOPHORECTOMY  2006   VESICOVAGINAL FISTULA CLOSURE W/ TAH      Home Medications:  Allergies as of 11/01/2021       Reactions   Shrimp [shellfish Allergy] Swelling   Lips will burn   Latex Itching   Burning        Medication List        Accurate as of November 01, 2021 11:59 PM. If you have any questions, ask your nurse or doctor.          acetaminophen 325 MG tablet Commonly known as: TYLENOL Take 2 tablets (650 mg total) by mouth every 4 (four) hours as needed for mild pain (or temp > 37.5 C (99.5 F)).   acidophilus  Caps capsule Take 1 capsule by mouth daily.   amLODipine 5 MG tablet Commonly known as: NORVASC Take 1 tablet (5 mg total) by mouth daily.   cyclobenzaprine 5 MG tablet Commonly known as: FLEXERIL Take 1 tablet (5 mg total) by mouth daily as needed for muscle spasms.   diclofenac Sodium 1 % Gel Commonly known as: VOLTAREN Apply 4 g topically 4 (four) times daily.   gabapentin 300 MG capsule Commonly known as: NEURONTIN Take 1 capsule (300 mg total) by mouth 3 (three) times daily.   polyethylene glycol powder 17 GM/SCOOP powder Commonly known as: GLYCOLAX/MIRALAX Take 17 g by mouth daily as needed for moderate constipation.   senna-docusate 8.6-50 MG tablet Commonly known as: Senokot-S Take 1 tablet by mouth 2 (two) times  daily.   valsartan-hydrochlorothiazide 80-12.5 MG tablet Commonly known as: DIOVAN-HCT Take 1 tablet by mouth daily.   Vitamin D 50 MCG (2000 UT) tablet Take 1 tablet (2,000 Units total) by mouth daily.        Allergies:  Allergies  Allergen Reactions   Shrimp [Shellfish Allergy] Swelling    Lips will burn    Latex Itching    Burning    Family History: Family History  Problem Relation Age of Onset   Breast cancer Sister 54   Hypertension Mother    Alzheimer's disease Mother    Dementia Mother    Hypertension Maternal Grandfather    Hyperlipidemia Maternal Grandfather    Birth defects Maternal Grandmother        Bladder   Colon cancer Maternal Grandmother    Congestive Heart Failure Father    Hyperlipidemia Father    Dementia Paternal Grandmother    Dementia Paternal Grandfather    Thyroid disease Sister     Social History:  reports that she has never smoked. She has never used smokeless tobacco. She reports current alcohol use. She reports that she does not use drugs.   Physical Exam: BP (!) 143/80   Pulse 80   Ht 5\' 2"  (1.575 m)   Wt 236 lb (107 kg)   BMI 43.16 kg/m   Constitutional:  Alert and oriented, No acute distress. HEENT: [place] AT, moist mucus membranes.  Trachea midline, no masses. Cardiovascular: No clubbing, cyanosis, or edema. Respiratory: Normal respiratory effort, no increased work of breathing. Skin: No rashes, bruises or suspicious lesions. Neurologic: Grossly intact, no focal deficits, moving all 4 extremities. Psychiatric: Normal mood and affect.  Laboratory Data:  Lab Results  Component Value Date   CREATININE 1.10 (H) 10/25/2021   Lab Results  Component Value Date   HGBA1C 5.9 (A) 02/27/2021   CT cystogram IMPRESSION: No evidence of bladder fistula.   Sigmoid diverticulosis, without radiographic evidence of diverticulitis.   Moderate paraumbilical ventral hernia, which contains transverse colon.     Electronically  Signed   By: Danae Orleans M.D.   On: 10/26/2021 13:34  I personally reviewed these images and agree with radiologic interpretation.  There is normal appearing bladder contour and close proximation with bowel but no obvious fistulous tract and no extravasation of contrast in the bladder.  Assessment & Plan:    Pneumaturia /concern for colovesical fistula - Reviewed cystogram and CT and no evidence of fistula were appreciated - She could have an occult or healing fistulous tract  - for time being recommend observation -We discussed repeat cystoscopy to ensure resolution of cystoscopic findings, she is agreeable this plan will return sooner if her symptoms worsen  Return in  about 3 months (around 02/01/2022) for 3 mo cysto.   Tawni Millers as a Neurosurgeon for Vanna Scotland, MD.,have documented all relevant documentation on the behalf of Vanna Scotland, MD,as directed by  Vanna Scotland, MD while in the presence of Vanna Scotland, MD.  I have reviewed the above documentation for accuracy and completeness, and I agree with the above.   Vanna Scotland, MD   [facility] [address] [place], [place] [address] [phone]

## 2021-11-02 ENCOUNTER — Encounter: Payer: Self-pay | Admitting: Internal Medicine

## 2021-11-02 ENCOUNTER — Ambulatory Visit: Payer: Medicare Other

## 2021-11-02 DIAGNOSIS — M6281 Muscle weakness (generalized): Secondary | ICD-10-CM

## 2021-11-02 DIAGNOSIS — R2681 Unsteadiness on feet: Secondary | ICD-10-CM

## 2021-11-02 DIAGNOSIS — R269 Unspecified abnormalities of gait and mobility: Secondary | ICD-10-CM

## 2021-11-02 DIAGNOSIS — R262 Difficulty in walking, not elsewhere classified: Secondary | ICD-10-CM | POA: Diagnosis not present

## 2021-11-02 DIAGNOSIS — I619 Nontraumatic intracerebral hemorrhage, unspecified: Secondary | ICD-10-CM | POA: Diagnosis not present

## 2021-11-02 NOTE — Therapy (Signed)
OUTPATIENT PHYSICAL THERAPY TREATMENT NOTE   Patient Name: Terri Werner MRN: [id number] DOB:16-Sep-1950, 71 y.o., female Today's Date: 11/02/2021  PCP: Teodora Medici DO  REFERRING PROVIDER: Charlett Blake MD    PT End of Session - 11/02/21 0807     Visit Number 11    Number of Visits 24    Date for PT Re-Evaluation 12/07/21    PT Start Time 0803    PT Stop Time 0845    PT Time Calculation (min) 42 min    Equipment Utilized During Treatment Gait belt    Activity Tolerance Patient tolerated treatment well    Behavior During Therapy [facility] for tasks assessed/performed             Past Medical History:  Diagnosis Date   Arthritis    pt reports in back & knees   Colon polyps 2010   At [facility]   Diverticulitis 2016   Genital warts    GERD (gastroesophageal reflux disease)    Heart murmur    Hepatitis B    History of blood transfusion    during each major surgery per pt   History of chicken pox    History of hiatal hernia    History of torn meniscus of right knee    HTN (hypertension)    Hx of migraine headaches    Hypertension    Hypopotassemia    Past Surgical History:  Procedure Laterality Date   ABDOMINAL HYSTERECTOMY  2006   [place]   COLONOSCOPY WITH PROPOFOL N/A 11/01/2014   Procedure: COLONOSCOPY WITH PROPOFOL;  Surgeon: Manya Silvas, MD;  Location: [place];  Service: Endoscopy;  Laterality: N/A;   COLONOSCOPY WITH PROPOFOL N/A 01/05/2020   Procedure: COLONOSCOPY WITH PROPOFOL;  Surgeon: [name], MD;  Location: [facility] ENDOSCOPY;  Service: Endoscopy;  Laterality: N/A;   [place]   HERNIA REPAIR  1660   Umbilical   KNEE ARTHROSCOPY Left 2006   torn meniscus   TONSILLECTOMY  1971   TOTAL ABDOMINAL HYSTERECTOMY W/ BILATERAL SALPINGOOPHORECTOMY  2006   VESICOVAGINAL FISTULA CLOSURE W/ TAH     Patient Active Problem List   Diagnosis Date Noted   Hx of  spontaneous intraparenchymal intracranial hemorrhage 07/12/2021   Intraparenchymal hemorrhage of brain ([place]) 05/31/2021   ICH (intracerebral hemorrhage) ([place]) 05/27/2021   Morbid obesity ([place]) 09/24/2018   Unspecified inflammatory spondylopathy, lumbar region ([place]) 03/05/2018   Lumbar spondylolysis 08/20/2017   Lumbar facet arthropathy 08/20/2017   Lumbar degenerative disc disease 08/20/2017   Chronic constipation 07/30/2017   Primary osteoarthritis involving multiple joints 04/17/2017   Postmenopausal 10/12/2016   Prediabetes 10/12/2016   Lumbago 08/04/2015   Diverticulitis of colon 07/05/2014   Carotid artery disorder ([place]) 04/27/2014   Hyperlipidemia 10/08/2013   Hypertension, benign 11/19/2009    REFERRING DIAG: R spastic hemiplegia   THERAPY DIAG:  Muscle weakness (generalized)  Unsteadiness on feet  Difficulty in walking, not elsewhere classified  Abnormality of gait and mobility  Rationale for Evaluation and Treatment Rehabilitation  PERTINENT HISTORY: Terri Werner is a 71 y.o. right-handed female with history of hepatitis B, arthritis, diverticulitis, GERD, hyperlipidemia, hypertension. Patient is recently retired. She has a daughter and niece in the area. Presented 05/27/2021 with sudden onset of right leg weakness and mild headache. Cranial CT scan showed acute parenchymal hemorrhage in the parasagittal left frontal parietal lobes with mild edema. No substantial mass effect. CT angiogram  head and neck no abnormal vascularity in the region of hemorrhage no hemodynamically significant stenosis. MRI follow-up showed no mass effect seen underlying the left posterior frontal hematoma. Patient admitted to inpatient rehab on 05/31/21 and discharged on 06/27/21. Patient used home health at home that stopped two weeks ago. Patient has R foot drop. Has a wheelchair she does household chores in, a RW, a quad cane and R AFO  PRECAUTIONS: fall  SUBJECTIVE: Patient reports  having increased pain in her right knee. Is interested in learning about adaptive driving.   PAIN:  Are you having pain? Yes: NPRS scale: 8/10 Pain location: r knee Pain description: aching Aggravating factors: weightbearing, moving Relieving factors: rest     TODAY'S TREATMENT:    11/02/21 INTERVENTIONS:    Neuromuscular re-ed:  Cone taps for 3 cones in // bars; BUE support 10x each LE    -Side step up and over orange hurdle with BUE support x 15 reps each side.   -forward step over and back orange hurdle SUE support 10x each LE, very challenging RLE   Airex balance beam:  -lateral stepping 6x length -tandem walk 6x length  Airex pad: throw balls for pertubation 15 balls x 2 trials     Therex:   Nustep Lvl 4 seat position 8; 4 minutes Spm > 60 for cardiovascular and musculoskeletal challenge:    Dynadisc no Afo on seated: -pf 15x; very challenging -df 15x; very challenging    Education provided throughout session via VC/TC and demonstration to facilitate movement at target joints and correct muscle activation for all testing and exercises performed.        PATIENT EDUCATION: Education details: exercise technique, adaptive driving Person educated: Patient Education method: Explanation, Demonstration, and Tactile cues Education comprehension: verbalized understanding, returned demonstration, verbal cues required, and tactile cues required   HOME EXERCISE PROGRAM: Added df/pf  exercise to HEP    PT Short Term Goals -      PT SHORT TERM GOAL #1   Title Patient will be independent in home exercise program to improve strength/mobility for better functional independence with ADLs.    Baseline 5/4: HEP given 6/20: does HEP 2x a week    Time 4    Period Weeks    Status Achieved    Target Date 10/12/21              PT Long Term Goals       PT LONG TERM GOAL #1   Title Patient will increase FOTO score to equal to or greater than 63%    to demonstrate  statistically significant improvement in mobility and quality of life.    Baseline 5/4: 48%, 6/20: 60%    Time 12    Period Weeks    Status Partially Met    Target Date 12/07/21      PT LONG TERM GOAL #2   Title Patient will increase 10 meter walk test to >1.5m/s as to improve gait speed for better community ambulation and to reduce fall risk.    Baseline 5/4: 0.54 m/s with RW, 6/20: 0.63 m/s    Time 12    Period Weeks    Status Partially Met    Target Date 12/07/21      PT LONG TERM GOAL #3   Title Patient will demonstrate an improved Berg Balance Score of >45/56 as to demonstrate improved balance with ADLs such as sitting/standing and transfer balance and reduced fall risk.    Baseline 5/4:  OUTPATIENT PHYSICAL THERAPY TREATMENT NOTE   Patient Name: Terri Werner MRN: [id number] DOB:16-Sep-1950, 71 y.o., female Today's Date: 11/02/2021  PCP: Teodora Medici DO  REFERRING PROVIDER: Charlett Blake MD    PT End of Session - 11/02/21 0807     Visit Number 11    Number of Visits 24    Date for PT Re-Evaluation 12/07/21    PT Start Time 0803    PT Stop Time 0845    PT Time Calculation (min) 42 min    Equipment Utilized During Treatment Gait belt    Activity Tolerance Patient tolerated treatment well    Behavior During Therapy [facility] for tasks assessed/performed             Past Medical History:  Diagnosis Date   Arthritis    pt reports in back & knees   Colon polyps 2010   At [facility]   Diverticulitis 2016   Genital warts    GERD (gastroesophageal reflux disease)    Heart murmur    Hepatitis B    History of blood transfusion    during each major surgery per pt   History of chicken pox    History of hiatal hernia    History of torn meniscus of right knee    HTN (hypertension)    Hx of migraine headaches    Hypertension    Hypopotassemia    Past Surgical History:  Procedure Laterality Date   ABDOMINAL HYSTERECTOMY  2006   [place]   COLONOSCOPY WITH PROPOFOL N/A 11/01/2014   Procedure: COLONOSCOPY WITH PROPOFOL;  Surgeon: Manya Silvas, MD;  Location: [place];  Service: Endoscopy;  Laterality: N/A;   COLONOSCOPY WITH PROPOFOL N/A 01/05/2020   Procedure: COLONOSCOPY WITH PROPOFOL;  Surgeon: [name], MD;  Location: [facility] ENDOSCOPY;  Service: Endoscopy;  Laterality: N/A;   [place]   HERNIA REPAIR  1660   Umbilical   KNEE ARTHROSCOPY Left 2006   torn meniscus   TONSILLECTOMY  1971   TOTAL ABDOMINAL HYSTERECTOMY W/ BILATERAL SALPINGOOPHORECTOMY  2006   VESICOVAGINAL FISTULA CLOSURE W/ TAH     Patient Active Problem List   Diagnosis Date Noted   Hx of  spontaneous intraparenchymal intracranial hemorrhage 07/12/2021   Intraparenchymal hemorrhage of brain ([place]) 05/31/2021   ICH (intracerebral hemorrhage) ([place]) 05/27/2021   Morbid obesity ([place]) 09/24/2018   Unspecified inflammatory spondylopathy, lumbar region ([place]) 03/05/2018   Lumbar spondylolysis 08/20/2017   Lumbar facet arthropathy 08/20/2017   Lumbar degenerative disc disease 08/20/2017   Chronic constipation 07/30/2017   Primary osteoarthritis involving multiple joints 04/17/2017   Postmenopausal 10/12/2016   Prediabetes 10/12/2016   Lumbago 08/04/2015   Diverticulitis of colon 07/05/2014   Carotid artery disorder ([place]) 04/27/2014   Hyperlipidemia 10/08/2013   Hypertension, benign 11/19/2009    REFERRING DIAG: R spastic hemiplegia   THERAPY DIAG:  Muscle weakness (generalized)  Unsteadiness on feet  Difficulty in walking, not elsewhere classified  Abnormality of gait and mobility  Rationale for Evaluation and Treatment Rehabilitation  PERTINENT HISTORY: Terri Werner is a 71 y.o. right-handed female with history of hepatitis B, arthritis, diverticulitis, GERD, hyperlipidemia, hypertension. Patient is recently retired. She has a daughter and niece in the area. Presented 05/27/2021 with sudden onset of right leg weakness and mild headache. Cranial CT scan showed acute parenchymal hemorrhage in the parasagittal left frontal parietal lobes with mild edema. No substantial mass effect. CT angiogram

## 2021-11-03 ENCOUNTER — Telehealth: Payer: Self-pay | Admitting: Internal Medicine

## 2021-11-03 DIAGNOSIS — Z1231 Encounter for screening mammogram for malignant neoplasm of breast: Secondary | ICD-10-CM

## 2021-11-06 ENCOUNTER — Ambulatory Visit (INDEPENDENT_AMBULATORY_CARE_PROVIDER_SITE_OTHER): Payer: Medicare Other | Admitting: Internal Medicine

## 2021-11-06 ENCOUNTER — Encounter: Payer: Self-pay | Admitting: Internal Medicine

## 2021-11-06 VITALS — BP 132/78 | HR 93 | Temp 98.3°F | Resp 16 | Ht 62.0 in | Wt 231.4 lb

## 2021-11-06 DIAGNOSIS — Z8679 Personal history of other diseases of the circulatory system: Secondary | ICD-10-CM

## 2021-11-06 DIAGNOSIS — M170 Bilateral primary osteoarthritis of knee: Secondary | ICD-10-CM

## 2021-11-07 ENCOUNTER — Ambulatory Visit: Payer: Medicare Other | Admitting: Physical Therapy

## 2021-11-07 ENCOUNTER — Encounter: Payer: Self-pay | Admitting: Physical Therapy

## 2021-11-07 DIAGNOSIS — R262 Difficulty in walking, not elsewhere classified: Secondary | ICD-10-CM | POA: Diagnosis not present

## 2021-11-07 DIAGNOSIS — R2681 Unsteadiness on feet: Secondary | ICD-10-CM | POA: Diagnosis not present

## 2021-11-07 DIAGNOSIS — I619 Nontraumatic intracerebral hemorrhage, unspecified: Secondary | ICD-10-CM | POA: Diagnosis not present

## 2021-11-07 DIAGNOSIS — M6281 Muscle weakness (generalized): Secondary | ICD-10-CM | POA: Diagnosis not present

## 2021-11-07 DIAGNOSIS — R269 Unspecified abnormalities of gait and mobility: Secondary | ICD-10-CM

## 2021-11-08 DIAGNOSIS — M1711 Unilateral primary osteoarthritis, right knee: Secondary | ICD-10-CM | POA: Diagnosis not present

## 2021-11-09 ENCOUNTER — Ambulatory Visit: Payer: Medicare Other

## 2021-11-09 DIAGNOSIS — M6281 Muscle weakness (generalized): Secondary | ICD-10-CM

## 2021-11-09 DIAGNOSIS — I619 Nontraumatic intracerebral hemorrhage, unspecified: Secondary | ICD-10-CM

## 2021-11-09 NOTE — Therapy (Deleted)
OUTPATIENT OCCUPATIONAL THERAPY NEURO EVALUATION  Patient Name: Terri Werner MRN: [id number] DOB:09-29-1950, 71 y.o., female Today's Date: 11/09/2021  PCP: Marland Kitchen REFERRING PROVIDER: ***    Past Medical History:  Diagnosis Date   Arthritis    pt reports in back & knees   Colon polyps 2010   At [facility]   Diverticulitis 2016   Genital warts    GERD (gastroesophageal reflux disease)    Heart murmur    Hepatitis B    History of blood transfusion    during each major surgery per pt   History of chicken pox    History of hiatal hernia    History of torn meniscus of right knee    HTN (hypertension)    Hx of migraine headaches    Hypertension    Hypopotassemia    Past Surgical History:  Procedure Laterality Date   ABDOMINAL HYSTERECTOMY  2006   APPENDECTOMY  1980   CESAREAN SECTION  1981   COLONOSCOPY WITH PROPOFOL N/A 11/01/2014   Procedure: COLONOSCOPY WITH PROPOFOL;  Surgeon: Scot Jun, MD;  Location: [facility] ENDOSCOPY;  Service: Endoscopy;  Laterality: N/A;   COLONOSCOPY WITH PROPOFOL N/A 01/05/2020   Procedure: COLONOSCOPY WITH PROPOFOL;  Surgeon: Regis Bill, MD;  Location: [facility] ENDOSCOPY;  Service: Endoscopy;  Laterality: N/A;   ECTOPIC PREGNANCY SURGERY  1980   HERNIA REPAIR  2006   [place]   KNEE ARTHROSCOPY Left 2006   torn meniscus   TONSILLECTOMY  1971   TOTAL ABDOMINAL HYSTERECTOMY W/ BILATERAL SALPINGOOPHORECTOMY  2006   VESICOVAGINAL FISTULA CLOSURE W/ TAH     Patient Active Problem List   Diagnosis Date Noted   Hx of spontaneous intraparenchymal intracranial hemorrhage 07/12/2021   Intraparenchymal hemorrhage of brain ([facility]) 05/31/2021   ICH (intracerebral hemorrhage) ([facility]) 05/27/2021   Morbid obesity ([facility]) 09/24/2018   Unspecified inflammatory spondylopathy, lumbar region ([facility]) 03/05/2018   Lumbar spondylolysis 08/20/2017   Lumbar facet arthropathy 08/20/2017   Lumbar degenerative disc disease 08/20/2017   Chronic constipation  07/30/2017   Primary osteoarthritis involving multiple joints 04/17/2017   Postmenopausal 10/12/2016   Prediabetes 10/12/2016   Lumbago 08/04/2015   Diverticulitis of colon 07/05/2014   Carotid artery disorder ([facility]) 04/27/2014   Hyperlipidemia 10/08/2013   Hypertension, benign 11/19/2009    ONSET DATE: ***  REFERRING DIAG: ***  THERAPY DIAG:  No diagnosis found.  Rationale for Evaluation and Treatment {HABREHAB:27488}  SUBJECTIVE:   SUBJECTIVE STATEMENT: *** Pt accompanied by: {accompnied:27141}  PERTINENT HISTORY: ***  PRECAUTIONS: {Therapy precautions:24002}  WEIGHT BEARING RESTRICTIONS {Yes ***/No:24003}  PAIN:  Are you having pain? {OPRCPAIN:27236}  FALLS: Has patient fallen in last 6 months? {fallsyesno:27318}  LIVING ENVIRONMENT: Lives with: {OPRC lives with:25569::"lives with their family"} Lives in: {Lives in:25570} Stairs: {opstairs:27293} Has following equipment at home: {Assistive devices:23999}  PLOF: {PLOF:24004}  PATIENT GOALS ***  OBJECTIVE:   HAND DOMINANCE: {MISC; OT HAND DOMINANCE:[phone]}  ADLs: Overall ADLs: *** Transfers/ambulation related to ADLs: Eating: *** Grooming: *** UB Dressing: *** LB Dressing: *** Toileting: *** Bathing: *** Tub Shower transfers: *** Equipment: {equipment:25573}   IADLs: Shopping: *** Light housekeeping: *** Meal Prep: *** Community mobility: *** Medication management: *** Financial management: *** Handwriting: {OTWRITTENEXPRESSION:25361}  MOBILITY STATUS: {OTMOBILITY:25360}  POSTURE COMMENTS:  {posture:25561} Sitting balance: {sitting balance:25483}  ACTIVITY TOLERANCE: Activity tolerance: ***  FUNCTIONAL OUTCOME MEASURES: {OTFUNCTIONALMEASURES:27238}  UPPER EXTREMITY ROM     {AROM/PROM:27142} ROM Right eval Left eval  Shoulder flexion    Shoulder abduction    Shoulder  adduction    Shoulder extension    Shoulder internal rotation    Shoulder external rotation    Elbow  flexion    Elbow extension    Wrist flexion    Wrist extension    Wrist ulnar deviation    Wrist radial deviation    Wrist pronation    Wrist supination    (Blank rows = not tested)   UPPER EXTREMITY MMT:     MMT Right eval Left eval  Shoulder flexion    Shoulder abduction    Shoulder adduction    Shoulder extension    Shoulder internal rotation    Shoulder external rotation    Middle trapezius    Lower trapezius    Elbow flexion    Elbow extension    Wrist flexion    Wrist extension    Wrist ulnar deviation    Wrist radial deviation    Wrist pronation    Wrist supination    (Blank rows = not tested)  HAND FUNCTION: {handfunction:27230}  COORDINATION: {otcoordination:27237}  SENSATION: {sensation:27233}  EDEMA: ***  MUSCLE TONE: {UETONE:25567}  COGNITION: Overall cognitive status: {cognition:24006}  VISION: Subjective report: *** Baseline vision: {OTBASELINEVISION:25363} Visual history: {OTVISUALHISTORY:25364}  VISION ASSESSMENT: {visionassessment:27231}  Patient has difficulty with following activities due to following visual impairments: ***  PERCEPTION: {Perception:25564}  PRAXIS: {Praxis:25565}  OBSERVATIONS: ***   TODAY'S TREATMENT:  ***   PATIENT EDUCATION: Education details: *** Person educated: {Person educated:25204} Education method: {Education Method:25205} Education comprehension: {Education Comprehension:25206}   HOME EXERCISE PROGRAM: ***    GOALS: Goals reviewed with patient? {yes/no:20286}  SHORT TERM GOALS: Target date: ***  *** Baseline: Goal status: {GOALSTATUS:25110}  2.  *** Baseline:  Goal status: {GOALSTATUS:25110}  3.  *** Baseline:  Goal status: {GOALSTATUS:25110}  4.  *** Baseline:  Goal status: {GOALSTATUS:25110}  5.  *** Baseline:  Goal status: {GOALSTATUS:25110}  6.  *** Baseline:  Goal status: {GOALSTATUS:25110}  LONG TERM GOALS: Target date: ***  *** Baseline:  Goal  status: {GOALSTATUS:25110}  2.  *** Baseline:  Goal status: {GOALSTATUS:25110}  3.  *** Baseline:  Goal status: {GOALSTATUS:25110}  4.  *** Baseline:  Goal status: {GOALSTATUS:25110}  5.  *** Baseline:  Goal status: {GOALSTATUS:25110}  6.  *** Baseline:  Goal status: {GOALSTATUS:25110}  ASSESSMENT:  CLINICAL IMPRESSION: Patient is a *** y.o. *** who was seen today for occupational therapy evaluation for ***.   PERFORMANCE DEFICITS in functional skills including {OT physical skills:25468}, cognitive skills including {OT cognitive skills:25469}, and psychosocial skills including {OT psychosocial skills:25470}.   IMPAIRMENTS are limiting patient from {OT performance deficits:25471}.   COMORBIDITIES {Comorbidities:25485} that affects occupational performance. Patient will benefit from skilled OT to address above impairments and improve overall function.  MODIFICATION OR ASSISTANCE TO COMPLETE EVALUATION: {OT modification:25474}  OT OCCUPATIONAL PROFILE AND HISTORY: {OT PROFILE AND HISTORY:25484}  CLINICAL DECISION MAKING: {OT CDM:25475}  REHAB POTENTIAL: {rehabpotential:25112}  EVALUATION COMPLEXITY: {Evaluation complexity:25115}    PLAN: OT FREQUENCY: {rehab frequency:25116}  OT DURATION: {rehab duration:25117}  PLANNED INTERVENTIONS: {OT Interventions:25467}  RECOMMENDED OTHER SERVICES: ***  CONSULTED AND AGREED WITH PLAN OF CARE: {MWU:13244}  PLAN FOR NEXT SESSION: ***   Otis Dials, OT 11/09/2021, 8:31 AM

## 2021-11-09 NOTE — Therapy (Signed)
OT Screen:  Pt arrived for OT eval stating her goal for OT was to have her car modified with pedals shifted to the L to accommodate for her R drop foot post CVA.  OT advised that [facility] neuro rehab does not provide this service, but referred pt to Helene Shoe with [employer] in [place], [place] to address her driving goals.  Pt denied any visual or UE deficits limiting her driving and wanted to proceed with getting her car adapted.  No additional OT indicated in this setting at [facility].  Pt in agreement with plan.

## 2021-11-13 ENCOUNTER — Ambulatory Visit: Payer: Medicare Other

## 2021-11-13 NOTE — Therapy (Signed)
OUTPATIENT PHYSICAL THERAPY TREATMENT NOTE   Patient Name: Terri Werner MRN: [id number] DOB:[birth year], 71 y.o., female Today's Date: 11/15/2021  PCP: Terri Mail DO  REFERRING PROVIDER: Erick Colace MD    PT End of Session - 11/15/21 0749     Visit Number 13    Number of Visits 24    Date for PT Re-Evaluation 12/07/21    PT Start Time 0800    PT Stop Time 0844    PT Time Calculation (min) 44 min    Equipment Utilized During Treatment Gait belt    Activity Tolerance Patient tolerated treatment well    Behavior During Therapy [facility] for tasks assessed/performed              Past Medical History:  Diagnosis Date   Arthritis    pt reports in back & knees   Colon polyps 2010   At [facility]   Diverticulitis 2016   Genital warts    GERD (gastroesophageal reflux disease)    Heart murmur    Hepatitis B    History of blood transfusion    during each major surgery per pt   History of chicken pox    History of hiatal hernia    History of torn meniscus of right knee    HTN (hypertension)    Hx of migraine headaches    Hypertension    Hypopotassemia    Past Surgical History:  Procedure Laterality Date   ABDOMINAL HYSTERECTOMY  2006   APPENDECTOMY  1980   CESAREAN SECTION  1981   COLONOSCOPY WITH PROPOFOL N/A 11/01/2014   Procedure: COLONOSCOPY WITH PROPOFOL;  Surgeon: Terri Jun, MD;  Location: [facility] ENDOSCOPY;  Service: Endoscopy;  Laterality: N/A;   COLONOSCOPY WITH PROPOFOL N/A 01/05/2020   Procedure: COLONOSCOPY WITH PROPOFOL;  Surgeon: Terri Bill, MD;  Location: [facility] ENDOSCOPY;  Service: Endoscopy;  Laterality: N/A;   ECTOPIC PREGNANCY SURGERY  1980   HERNIA REPAIR  2006   Umbilical   KNEE ARTHROSCOPY Left 2006   torn meniscus   TONSILLECTOMY  1971   TOTAL ABDOMINAL HYSTERECTOMY W/ BILATERAL SALPINGOOPHORECTOMY  2006   VESICOVAGINAL FISTULA CLOSURE W/ TAH     Patient Active Problem List   Diagnosis Date Noted   Hx of  spontaneous intraparenchymal intracranial hemorrhage 07/12/2021   Intraparenchymal hemorrhage of brain (HCC) 05/31/2021   ICH (intracerebral hemorrhage) (HCC) 05/27/2021   Morbid obesity (HCC) 09/24/2018   Unspecified inflammatory spondylopathy, lumbar region (HCC) 03/05/2018   Lumbar spondylolysis 08/20/2017   Lumbar facet arthropathy 08/20/2017   Lumbar degenerative disc disease 08/20/2017   Chronic constipation 07/30/2017   Primary osteoarthritis involving multiple joints 04/17/2017   Postmenopausal 10/12/2016   Prediabetes 10/12/2016   Lumbago 08/04/2015   Diverticulitis of colon 07/05/2014   Carotid artery disorder (HCC) 04/27/2014   Hyperlipidemia 10/08/2013   Hypertension, benign 11/19/2009    REFERRING DIAG: R spastic hemiplegia   THERAPY DIAG:  Muscle weakness (generalized)  Unsteadiness on feet  Rationale for Evaluation and Treatment Rehabilitation  PERTINENT HISTORY: Terri Werner is a 71 y.o. right-handed female with history of hepatitis B, arthritis, diverticulitis, GERD, hyperlipidemia, hypertension. Patient is recently retired. She has a daughter and niece in the area. Presented 05/27/2021 with sudden onset of right leg weakness and mild headache. Cranial CT scan showed acute parenchymal hemorrhage in the parasagittal left frontal parietal lobes with mild edema. No substantial mass effect. CT angiogram head and neck no abnormal vascularity in the region of hemorrhage no  hemodynamically significant stenosis. MRI follow-up showed no mass effect seen underlying the left posterior frontal hematoma. Patient admitted to inpatient rehab on 05/31/21 and discharged on 06/27/21. Patient used home health at home that stopped two weeks ago. Patient has R foot drop. Has a wheelchair she does household chores in, a RW, a quad cane and R AFO  PRECAUTIONS: fall  SUBJECTIVE: Patient reports she had injections in the R knee since she was seen last. Is not sure when she is  getting the L knee done.   PAIN:  Are you having pain? Yes: NPRS scale: 0/10 Pain location: r knee Pain description: aching Aggravating factors: weightbearing, moving Relieving factors: rest     TODAY'S TREATMENT:    11/15/21 INTERVENTIONS:    Neuromuscular re-ed:  Standing on airex pad: -tapping single foot to stepping stone #3, x3 sets  Progressed to multi-directional stepping x1 min each LE Pt exhibits decreased RLE stance control requiring min A when stepping with LLE; she also exhibits mild apraxia requiring increased attempts to achieve targets when stepping with RLE with min A for safety;    -Side step up and over 1/2 bolster with 2-1 UE support x 10 reps each side.with min VCS to increase hip flexion for better foot clearance;   -forward step over and back 1/2 bolster 1-0 UE support 10x each LE, very challenging RLE with increased foot drag;      Standing on airex: -alternate toe taps to 6 inch step x10 reps unsupported with CGA to min A for safety; Pt requires increased time for weight shift -Standing tandem stance with one leg on airex pad one leg on step 30 seconds with horizontal head turns    Airex balance beam: lateral stepping 6x length of // bars; challenging for R foot palcement      Therex:   Nustep Lvl 4 seat position 8; 4 minutes Spm > 60 for cardiovascular and musculoskeletal challenge:   4lb ankle weight: -lateral stepping 4x length of // bars -high knee march 4x length of // bars with UE support    4lb ankle weights seated: -LAQ 10x each LE  -march 10x each LE -latreal step/abduction 10x each LE  10x STS;  Required therapist to facilitate increased ROM- decreased toe flexion/extension initiation. Pt reports increased stiffness today. She required max A for don/doff shoe;    Education provided throughout session via VC/TC and demonstration to facilitate movement at target joints and correct muscle activation for all testing and exercises  performed.       PATIENT EDUCATION: Education details: exercise technique Person educated: Patient Education method: Explanation, Demonstration, and Tactile cues Education comprehension: verbalized understanding, returned demonstration, verbal cues required, and tactile cues required   HOME EXERCISE PROGRAM: Continue as previously given  Added df/pf  exercise to HEP -11/02/21   PT Short Term Goals -      PT SHORT TERM GOAL #1   Title Patient will be independent in home exercise program to improve strength/mobility for better functional independence with ADLs.    Baseline 5/4: HEP given 6/20: does HEP 2x a week    Time 4    Period Weeks    Status Achieved    Target Date 10/12/21              PT Long Term Goals       PT LONG TERM GOAL #1   Title Patient will increase FOTO score to equal to or greater than 63%    to  demonstrate statistically significant improvement in mobility and quality of life.    Baseline 5/4: 48%, 6/20: 60%    Time 12    Period Weeks    Status Partially Met    Target Date 12/07/21      PT LONG TERM GOAL #2   Title Patient will increase 10 meter walk test to >1.63m/s as to improve gait speed for better community ambulation and to reduce fall risk.    Baseline 5/4: 0.54 m/s with RW, 6/20: 0.63 m/s    Time 12    Period Weeks    Status Partially Met    Target Date 12/07/21      PT LONG TERM GOAL #3   Title Patient will demonstrate an improved Berg Balance Score of >45/56 as to demonstrate improved balance with ADLs such as sitting/standing and transfer balance and reduced fall risk.    Baseline 5/4: 32/56, 6/20: 45/56    Time 12    Period Weeks    Status Partially Met    Target Date 12/07/21      PT LONG TERM GOAL #4   Title Patient will increase BLE gross strength to 4+/5 as to improve functional strength for independent gait, increased standing tolerance and increased ADL ability.    Baseline 5/4: see note, 6/20:R knee 4/5    Time 12     Period Weeks    Status Partially Met    Target Date 12/07/21                Plan     Clinical Impression Statement Patient is highly motivated throughout session. She tolerates interventions without pain and with improved stability. Her ability to stabilize on unstable surfaces has improved with reduction of knee pain. She continues to have difficulty with clearance of RLE.  Patient would benefit from additional skilled PT intervention to improve strength, balance and gait safety;    Personal Factors and Comorbidities Comorbidity 3+;Fitness;Past/Current Experience;Time since onset of injury/illness/exacerbation;Transportation    Comorbidities hepatitis B, arthritis, diverticulitis, GERD, hyperlipidemia, hypertension    Examination-Activity Limitations Bed Mobility;Caring for [employer];Locomotion Level;Lift;Squat;Stairs;Stand;Toileting;Transfers    Examination-Participation Restrictions Cleaning;Driving;Meal Prep;Laundry;Shop;Volunteer;Yard Work    Conservation officer, historic buildings Evolving/Moderate complexity    Rehab Potential Fair    PT Frequency 2x / week    PT Duration 12 weeks    PT Treatment/Interventions ADLs/Self Care Home Management;Canalith Repostioning;Cryotherapy;Electrical Stimulation;Iontophoresis 4mg /ml Dexamethasone;Moist Heat;Traction;Ultrasound;Functional mobility training;Stair training;Gait training;DME Instruction;Therapeutic activities;Therapeutic exercise;Balance training;Neuromuscular re-education;Manual techniques;Orthotic Fit/Training;Patient/family education;Passive range of motion;Dry needling;Taping;Splinting;Energy conservation;Visual/perceptual remediation/compensation;Vestibular    PT Next Visit Plan progress stabilization and strength    PT Home Exercise Plan see above    Consulted and Agree with Plan of Care Patient              Precious Bard, PT, DPT   11/15/2021, 8:44 AM

## 2021-11-15 ENCOUNTER — Ambulatory Visit: Payer: Medicare Other | Attending: Neurology

## 2021-11-15 DIAGNOSIS — R262 Difficulty in walking, not elsewhere classified: Secondary | ICD-10-CM | POA: Diagnosis not present

## 2021-11-15 DIAGNOSIS — R2681 Unsteadiness on feet: Secondary | ICD-10-CM | POA: Insufficient documentation

## 2021-11-15 DIAGNOSIS — I619 Nontraumatic intracerebral hemorrhage, unspecified: Secondary | ICD-10-CM | POA: Insufficient documentation

## 2021-11-15 DIAGNOSIS — M6281 Muscle weakness (generalized): Secondary | ICD-10-CM | POA: Insufficient documentation

## 2021-11-15 DIAGNOSIS — R269 Unspecified abnormalities of gait and mobility: Secondary | ICD-10-CM | POA: Diagnosis not present

## 2021-11-16 NOTE — Therapy (Signed)
OUTPATIENT PHYSICAL THERAPY TREATMENT NOTE   Patient Name: [name] MRN: [id number] DOB:10-11-1950, 71 y.o., female Today's Date: 11/20/2021  PCP: Margarita Mail DO  REFERRING PROVIDER: Erick Colace MD    PT End of Session - 11/20/21 0959     Visit Number 14    Number of Visits 24    Date for PT Re-Evaluation 12/07/21    PT Start Time 1015    PT Stop Time 1059    PT Time Calculation (min) 44 min    Equipment Utilized During Treatment Gait belt    Activity Tolerance Patient tolerated treatment well    Behavior During Therapy WFL for tasks assessed/performed               Past Medical History:  Diagnosis Date   Arthritis    pt reports in back & knees   Colon polyps 2010   At [facility]   Diverticulitis 2016   Genital warts    GERD (gastroesophageal reflux disease)    Heart murmur    Hepatitis B    History of blood transfusion    during each major surgery per pt   History of chicken pox    History of hiatal hernia    History of torn meniscus of right knee    HTN (hypertension)    Hx of migraine headaches    Hypertension    Hypopotassemia    Past Surgical History:  Procedure Laterality Date   ABDOMINAL HYSTERECTOMY  2006   APPENDECTOMY  1980   CESAREAN SECTION  1981   COLONOSCOPY WITH PROPOFOL N/A 11/01/2014   Procedure: COLONOSCOPY WITH PROPOFOL;  Surgeon: Scot Jun, MD;  Location: [facility] ENDOSCOPY;  Service: Endoscopy;  Laterality: N/A;   COLONOSCOPY WITH PROPOFOL N/A 01/05/2020   Procedure: COLONOSCOPY WITH PROPOFOL;  Surgeon: Regis Bill, MD;  Location: [facility] ENDOSCOPY;  Service: Endoscopy;  Laterality: N/A;   ECTOPIC PREGNANCY SURGERY  1980   HERNIA REPAIR  2006   Umbilical   KNEE ARTHROSCOPY Left 2006   torn meniscus   TONSILLECTOMY  1971   TOTAL ABDOMINAL HYSTERECTOMY W/ BILATERAL SALPINGOOPHORECTOMY  2006   VESICOVAGINAL FISTULA CLOSURE W/ TAH     Patient Active Problem List   Diagnosis Date Noted   Hx of  spontaneous intraparenchymal intracranial hemorrhage 07/12/2021   Intraparenchymal hemorrhage of brain (HCC) 05/31/2021   ICH (intracerebral hemorrhage) (HCC) 05/27/2021   Morbid obesity (HCC) 09/24/2018   Unspecified inflammatory spondylopathy, lumbar region (HCC) 03/05/2018   Lumbar spondylolysis 08/20/2017   Lumbar facet arthropathy 08/20/2017   Lumbar degenerative disc disease 08/20/2017   Chronic constipation 07/30/2017   Primary osteoarthritis involving multiple joints 04/17/2017   Postmenopausal 10/12/2016   Prediabetes 10/12/2016   Lumbago 08/04/2015   Diverticulitis of colon 07/05/2014   Carotid artery disorder (HCC) 04/27/2014   Hyperlipidemia 10/08/2013   Hypertension, benign 11/19/2009    REFERRING DIAG: R spastic hemiplegia   THERAPY DIAG:  Muscle weakness (generalized)  Unsteadiness on feet  Difficulty in walking, not elsewhere classified  Rationale for Evaluation and Treatment Rehabilitation  PERTINENT HISTORY: Terri Werner is a 71 y.o. right-handed female with history of hepatitis B, arthritis, diverticulitis, GERD, hyperlipidemia, hypertension. Patient is recently retired. She has a daughter and niece in the area. Presented 05/27/2021 with sudden onset of right leg weakness and mild headache. Cranial CT scan showed acute parenchymal hemorrhage in the parasagittal left frontal parietal lobes with mild edema. No substantial mass effect. CT angiogram head and neck no  abnormal vascularity in the region of hemorrhage no hemodynamically significant stenosis. MRI follow-up showed no mass effect seen underlying the left posterior frontal hematoma. Patient admitted to inpatient rehab on 05/31/21 and discharged on 06/27/21. Patient used home health at home that stopped two weeks ago. Patient has R foot drop. Has a wheelchair she does household chores in, a RW, a quad cane and R AFO  PRECAUTIONS: fall  SUBJECTIVE: Patient reports she has been busy, had her grandkids  last night. Is feeling a little tired today.   PAIN:  Are you having pain? Yes: NPRS scale: 0/10 Pain location: r knee Pain description: aching Aggravating factors: weightbearing, moving Relieving factors: rest     TODAY'S TREATMENT:        Neuromuscular re-ed:     -Side step up and over 1/2 bolster with 2-1 UE support x 10 reps each side.with min VCS to increase hip flexion for better foot clearance;   -forward step over and back 1/2 bolster 1-0 UE support 10x each LE, very challenging RLE with increased foot drag;    Bosu ball: flat side down:  -lateral modified lunge 10x each LE; heavy UE support  -forward modified lunge 10x each LE; heavy UE support  Standing on airex: -visual scan with reach for dual task cognitive game x 6 minutes      Therex:   Three way hip hikes 10x each LE  Multiple sit to stands throughout session      Education provided throughout session via VC/TC and demonstration to facilitate movement at target joints and correct muscle activation for all testing and exercises performed.       PATIENT EDUCATION: Education details: exercise technique Person educated: Patient Education method: Explanation, Demonstration, and Tactile cues Education comprehension: verbalized understanding, returned demonstration, verbal cues required, and tactile cues required   HOME EXERCISE PROGRAM: Continue as previously given  Added df/pf  exercise to HEP -11/02/21   PT Short Term Goals -      PT SHORT TERM GOAL #1   Title Patient will be independent in home exercise program to improve strength/mobility for better functional independence with ADLs.    Baseline 5/4: HEP given 6/20: does HEP 2x a week    Time 4    Period Weeks    Status Achieved    Target Date 10/12/21              PT Long Term Goals       PT LONG TERM GOAL #1   Title Patient will increase FOTO score to equal to or greater than 63%    to demonstrate statistically significant  improvement in mobility and quality of life.    Baseline 5/4: 48%, 6/20: 60%    Time 12    Period Weeks    Status Partially Met    Target Date 12/07/21      PT LONG TERM GOAL #2   Title Patient will increase 10 meter walk test to >1.80m/s as to improve gait speed for better community ambulation and to reduce fall risk.    Baseline 5/4: 0.54 m/s with RW, 6/20: 0.63 m/s    Time 12    Period Weeks    Status Partially Met    Target Date 12/07/21      PT LONG TERM GOAL #3   Title Patient will demonstrate an improved Berg Balance Score of >45/56 as to demonstrate improved balance with ADLs such as sitting/standing and transfer balance and reduced fall risk.  Baseline 5/4: 32/56, 6/20: 45/56    Time 12    Period Weeks    Status Partially Met    Target Date 12/07/21      PT LONG TERM GOAL #4   Title Patient will increase BLE gross strength to 4+/5 as to improve functional strength for independent gait, increased standing tolerance and increased ADL ability.    Baseline 5/4: see note, 6/20:R knee 4/5    Time 12    Period Weeks    Status Partially Met    Target Date 12/07/21                Plan     Clinical Impression Statement Patient is more unstable this session requiring close CGA throughout session. She is able to tolerate higher level tasks today despite unsteadiness due to high level of motivation. She does have intermittent tripping due to foot drag. Patient would benefit from additional skilled PT intervention to improve strength, balance and gait safety;    Personal Factors and Comorbidities Comorbidity 3+;Fitness;Past/Current Experience;Time since onset of injury/illness/exacerbation;Transportation    Comorbidities hepatitis B, arthritis, diverticulitis, GERD, hyperlipidemia, hypertension    Examination-Activity Limitations Bed Mobility;Caring for [employer];Locomotion Level;Lift;Squat;Stairs;Stand;Toileting;Transfers     Examination-Participation Restrictions Cleaning;Driving;Meal Prep;Laundry;Shop;Volunteer;Yard Work    Conservation officer, historic buildings Evolving/Moderate complexity    Rehab Potential Fair    PT Frequency 2x / week    PT Duration 12 weeks    PT Treatment/Interventions ADLs/Self Care Home Management;Canalith Repostioning;Cryotherapy;Electrical Stimulation;Iontophoresis 4mg /ml Dexamethasone;Moist Heat;Traction;Ultrasound;Functional mobility training;Stair training;Gait training;DME Instruction;Therapeutic activities;Therapeutic exercise;Balance training;Neuromuscular re-education;Manual techniques;Orthotic Fit/Training;Patient/family education;Passive range of motion;Dry needling;Taping;Splinting;Energy conservation;Visual/perceptual remediation/compensation;Vestibular    PT Next Visit Plan progress stabilization and strength    PT Home Exercise Plan see above    Consulted and Agree with Plan of Care Patient              Precious Bard, PT, DPT   11/20/2021, 10:59 AM

## 2021-11-19 ENCOUNTER — Other Ambulatory Visit: Payer: Self-pay | Admitting: Internal Medicine

## 2021-11-19 DIAGNOSIS — Z8679 Personal history of other diseases of the circulatory system: Secondary | ICD-10-CM

## 2021-11-20 ENCOUNTER — Ambulatory Visit: Payer: Medicare Other

## 2021-11-20 ENCOUNTER — Encounter: Payer: Medicare Other | Admitting: Occupational Therapy

## 2021-11-20 DIAGNOSIS — R2681 Unsteadiness on feet: Secondary | ICD-10-CM

## 2021-11-20 DIAGNOSIS — R269 Unspecified abnormalities of gait and mobility: Secondary | ICD-10-CM | POA: Diagnosis not present

## 2021-11-20 DIAGNOSIS — M6281 Muscle weakness (generalized): Secondary | ICD-10-CM | POA: Diagnosis not present

## 2021-11-20 DIAGNOSIS — I619 Nontraumatic intracerebral hemorrhage, unspecified: Secondary | ICD-10-CM | POA: Diagnosis not present

## 2021-11-20 DIAGNOSIS — R262 Difficulty in walking, not elsewhere classified: Secondary | ICD-10-CM

## 2021-11-20 NOTE — Telephone Encounter (Signed)
Requested medication (s) are due for refill today: yes  Requested medication (s) are on the active medication list: yes    Last refill: 09/19/21  #30  1 refill  Future visit scheduled no  Notes to clinic:Not delegated, please review. Thank you.  Requested Prescriptions  Pending Prescriptions Disp Refills   cyclobenzaprine (FLEXERIL) 5 MG tablet [Pharmacy Med Name: CYCLOBENZAPRINE 5 MG TABLET] 30 tablet 1    Sig: TAKE 1 TABLET BY MOUTH DAILY AS NEEDED FOR MUSCLE SPASMS.     Not Delegated - Analgesics:  Muscle Relaxants Failed - 11/19/2021 11:44 AM      Failed - This refill cannot be delegated      Passed - Valid encounter within last 6 months    Recent Outpatient Visits           2 weeks ago Hx of spontaneous intraparenchymal intracranial hemorrhage   [place], DO   2 months ago Hypertension, benign   [facility] Teodora Medici, DO   3 months ago Hypertension, benign   [place], DO   4 months ago Hypertension, benign   [facility] Teodora Medici, DO   4 months ago Hx of spontaneous intraparenchymal intracranial hemorrhage   [facility] Teodora Medici, DO       Future Appointments             In 1 month Teodora Medici, [facility], [facility]

## 2021-11-21 ENCOUNTER — Other Ambulatory Visit: Payer: Self-pay

## 2021-11-21 DIAGNOSIS — Z8679 Personal history of other diseases of the circulatory system: Secondary | ICD-10-CM

## 2021-11-22 ENCOUNTER — Encounter: Payer: Medicare Other | Admitting: Occupational Therapy

## 2021-11-22 ENCOUNTER — Ambulatory Visit: Payer: Medicare Other

## 2021-11-22 DIAGNOSIS — I619 Nontraumatic intracerebral hemorrhage, unspecified: Secondary | ICD-10-CM | POA: Diagnosis not present

## 2021-11-22 DIAGNOSIS — R2681 Unsteadiness on feet: Secondary | ICD-10-CM | POA: Diagnosis not present

## 2021-11-22 DIAGNOSIS — M6281 Muscle weakness (generalized): Secondary | ICD-10-CM | POA: Diagnosis not present

## 2021-11-22 DIAGNOSIS — R269 Unspecified abnormalities of gait and mobility: Secondary | ICD-10-CM | POA: Diagnosis not present

## 2021-11-22 DIAGNOSIS — R262 Difficulty in walking, not elsewhere classified: Secondary | ICD-10-CM | POA: Diagnosis not present

## 2021-11-22 NOTE — Therapy (Signed)
trianing, specifically wth [employer] utility remains motivated to progress her independence and safety. She requires minA for safety intermittently in session for more challenging activities.    Personal Factors and Comorbidities Comorbidity 3+;Fitness;Past/Current Experience;Time since onset of injury/illness/exacerbation;Transportation    Comorbidities hepatitis B, arthritis, diverticulitis, GERD, hyperlipidemia, hypertension    Examination-Activity Limitations Bed Mobility;Caring for [employer];Locomotion Level;Lift;Squat;Stairs;Stand;Toileting;Transfers    Examination-Participation Restrictions Cleaning;Driving;Meal Prep;Laundry;Shop;Volunteer;Yard Work    Merchant navy officer Evolving/Moderate complexity    Clinical Decision Making Moderate    Rehab Potential Good    Clinical Impairments Affecting Rehab Potential Body habitus, sitting for her occupation, chronic low back pain, psychosocial factors (stress/depression at work)    PT Frequency 2x / week    PT Duration 12 weeks    PT Treatment/Interventions ADLs/Self Care Home Management;Canalith Repostioning;Cryotherapy;Electrical Stimulation;Iontophoresis 82m/ml Dexamethasone;Moist Heat;Traction;Ultrasound;Functional mobility training;Stair training;Gait training;DME Instruction;Therapeutic activities;Therapeutic exercise;Balance training;Neuromuscular re-education;Manual techniques;Orthotic Fit/Training;Patient/family education;Passive range of motion;Dry needling;Taping;Splinting;Energy conservation;Visual/perceptual remediation/compensation;Vestibular    PT Next Visit Plan progress stabilization and use of SPC for community navigation    PT Home Exercise Plan no updates today    Consulted and Agree with Plan of Care Patient                  Patient will benefit from skilled therapeutic  intervention in order to improve the following deficits and impairments:     Visit Diagnosis: Muscle weakness (generalized)  Unsteadiness on feet  Difficulty in walking, not elsewhere classified  Intraparenchymal hemorrhage of brain (HCC)  Abnormality of gait and mobility     Problem List Patient Active Problem List   Diagnosis Date Noted   Hx of spontaneous intraparenchymal intracranial hemorrhage 07/12/2021   Intrap[place]renchymal hemorrhage of brain ([place] 05/31/2021   ICH (intracerebral hemorrhage) ([place] 05/27/2021   Morbid obesity ([place] 09/24/2018   Unspecified inflammatory spondylopathy, lumbar region ([place] 03/05/2018   Lumbar spondylolysis 08/20/2017   Lumbar facet arthropathy 08/20/2017   Lumbar degenerative disc disease 08/20/2017   Chronic constipation 07/30/2017   Primary osteoarthritis involving multiple joints 04/17/2017   Postmenopausal 10/12/2016   Prediabetes 10/12/2016   Lumbago 08/04/2015   Diverticulitis of colon 07/05/2014   Carotid artery disorder ([place] 04/27/2014   Hyperlipidemia 10/08/2013   Hypertension, benign 11/19/2009   10:15 AM, 11/22/21 AEtta Grandchild PT, DPT Physical Therapist - [facility]- [phone]    BLiberal PT 11/22/2021, 9:46 AM  [place] [address]Phone: [phone]  Fax:  [phone] Name: Terri Werner: [id number] of Birth: [birth year]  trianing, specifically wth [employer] utility remains motivated to progress her independence and safety. She requires minA for safety intermittently in session for more challenging activities.    Personal Factors and Comorbidities Comorbidity 3+;Fitness;Past/Current Experience;Time since onset of injury/illness/exacerbation;Transportation    Comorbidities hepatitis B, arthritis, diverticulitis, GERD, hyperlipidemia, hypertension    Examination-Activity Limitations Bed Mobility;Caring for [employer];Locomotion Level;Lift;Squat;Stairs;Stand;Toileting;Transfers    Examination-Participation Restrictions Cleaning;Driving;Meal Prep;Laundry;Shop;Volunteer;Yard Work    Merchant navy officer Evolving/Moderate complexity    Clinical Decision Making Moderate    Rehab Potential Good    Clinical Impairments Affecting Rehab Potential Body habitus, sitting for her occupation, chronic low back pain, psychosocial factors (stress/depression at work)    PT Frequency 2x / week    PT Duration 12 weeks    PT Treatment/Interventions ADLs/Self Care Home Management;Canalith Repostioning;Cryotherapy;Electrical Stimulation;Iontophoresis 82m/ml Dexamethasone;Moist Heat;Traction;Ultrasound;Functional mobility training;Stair training;Gait training;DME Instruction;Therapeutic activities;Therapeutic exercise;Balance training;Neuromuscular re-education;Manual techniques;Orthotic Fit/Training;Patient/family education;Passive range of motion;Dry needling;Taping;Splinting;Energy conservation;Visual/perceptual remediation/compensation;Vestibular    PT Next Visit Plan progress stabilization and use of SPC for community navigation    PT Home Exercise Plan no updates today    Consulted and Agree with Plan of Care Patient                  Patient will benefit from skilled therapeutic  intervention in order to improve the following deficits and impairments:     Visit Diagnosis: Muscle weakness (generalized)  Unsteadiness on feet  Difficulty in walking, not elsewhere classified  Intraparenchymal hemorrhage of brain (HCC)  Abnormality of gait and mobility     Problem List Patient Active Problem List   Diagnosis Date Noted   Hx of spontaneous intraparenchymal intracranial hemorrhage 07/12/2021   Intraparenchymal hemorrhage of brain ([place] 05/31/2021   ICH (intracerebral hemorrhage) ([place] 05/27/2021   Morbid obesity ([place] 09/24/2018   Unspecified inflammatory spondylopathy, lumbar region ([place] 03/05/2018   Lumbar spondylolysis 08/20/2017   Lumbar facet arthropathy 08/20/2017   Lumbar degenerative disc disease 08/20/2017   Chronic constipation 07/30/2017   Primary osteoarthritis involving multiple joints 04/17/2017   Postmenopausal 10/12/2016   Prediabetes 10/12/2016   Lumbago 08/04/2015   Diverticulitis of colon 07/05/2014   Carotid artery disorder ([place] 04/27/2014   Hyperlipidemia 10/08/2013   Hypertension, benign 11/19/2009   10:15 AM, 11/22/21 AEtta Grandchild PT, DPT Physical Therapist - [phone]    BLiberal PT 11/22/2021, 9:46 AM  [place] [address]Phone: [phone]  Fax:  [phone] Name: Terri Werner: [id number] of Birth: [birth year]  [place] [facility] [address] [place], [place], [address] Phone: [phone]   Fax:  [phone]  Physical Therapy Treatment  Patient Details  Name: TAMMALA WEIDER MRN: [id number] Date of Birth: [birth year] Referring Provider (PT): Jennings Books MD   Encounter Date: 11/22/2021   PT End of Session - 11/22/21 0936     Visit Number 15    Number of Visits 24    Date for PT Re-Evaluation 12/07/21    Authorization Type BCBS Medicare    Authorization Time Period 09/14/21 12/07/21    Progress Note Due on Visit 20    PT Start Time 0930    PT Stop Time 1010    PT Time Calculation (min) 40 min    Equipment Utilized During Treatment Gait belt    Activity Tolerance Patient tolerated treatment well;No increased pain    Behavior During Therapy [facility] for tasks assessed/performed             Past Medical History:  Diagnosis Date   Arthritis    pt reports in back & knees   Colon polyps 2010   At [facility]   Diverticulitis 2016   Genital warts    GERD (gastroesophageal reflux disease)    Heart murmur    Hepatitis B    History of blood transfusion    during each major surgery per pt   History of chicken pox    History of hiatal hernia    History of torn meniscus of right knee    HTN (hypertension)    Hx of migraine headaches    Hypertension    Hypopotassemia     Past Surgical History:  Procedure Laterality Date   ABDOMINAL HYSTERECTOMY  2006   [place]   COLONOSCOPY WITH PROPOFOL N/A 11/01/2014   Procedure: COLONOSCOPY WITH PROPOFOL;  Surgeon: Manya Silvas, MD;  Location: [place];  Service: Endoscopy;  Laterality: N/A;   COLONOSCOPY WITH PROPOFOL N/A 01/05/2020   Procedure: COLONOSCOPY WITH PROPOFOL;  Surgeon: [name], MD;  Location: [facility] ENDOSCOPY;  Service: Endoscopy;  Laterality: N/A;   [place]   HERNIA REPAIR  [id number]   Umbilical   KNEE  ARTHROSCOPY Left 2006   torn meniscus   TONSILLECTOMY  1971   TOTAL ABDOMINAL HYSTERECTOMY W/ BILATERAL SALPINGOOPHORECTOMY  2006   VESICOVAGINAL FISTULA CLOSURE W/ TAH      There were no vitals filed for this visit.   Subjective Assessment - 11/22/21 0933     Subjective Pt reports shes doing good today, had a nice weekend. Still working on walking more during the day. Pt still using her AFO for large part of day. Knee still feeling better since injections were taken.    Pertinent History DAI APEL is a 71 y.o. right-handed female with history of hepatitis B, arthritis, diverticulitis, GERD, hyperlipidemia, hypertension. Patient is recently retired.  She has a daughter and niece in the area.  Presented 05/27/2021 with sudden onset of right leg weakness and mild headache.  Cranial CT scan showed acute parenchymal hemorrhage in the parasagittal left frontal parietal lobes with mild edema.  No substantial mass effect.  CT angiogram head and neck no abnormal vascularity in the region of hemorrhage no hemodynamically significant stenosis.  MRI follow-up showed no mass effect seen underlying the left posterior frontal hematoma. Patient admitted to inpatient rehab on 05/31/21 and discharged on 06/27/21. Patient used home health

## 2021-11-23 ENCOUNTER — Encounter: Payer: Medicare Other | Attending: Registered Nurse | Admitting: Physical Medicine & Rehabilitation

## 2021-11-23 ENCOUNTER — Encounter: Payer: Self-pay | Admitting: Physical Medicine & Rehabilitation

## 2021-11-23 VITALS — BP 137/83 | HR 70 | Ht 62.0 in | Wt 227.4 lb

## 2021-11-23 DIAGNOSIS — G8111 Spastic hemiplegia affecting right dominant side: Secondary | ICD-10-CM | POA: Insufficient documentation

## 2021-11-23 NOTE — Progress Notes (Signed)
Subjective:    Patient ID: Terri Werner, female    DOB: 03/04/1951, 71 y.o.   MRN: [id number]  HPI 71 year old female with history of left CVA causing right spastic hemiparesis affecting primarily the lower extremity.  She has been going through some physical therapy.  She wears a right AFO for ambulation as well as uses a rolling walker. She has primary concerns with foot turning in and toes currently.  She underwent botulinum toxin injection i.e. Xeomin 6 weeks ago.  She has not noted significant improvement in her toe flexion spasticity or her foot inversion.  She had injection of the FHL, FDL, gastrosoleus on the right side.  Posterior tibialis was not injected Total of 225 units were utilized.  Pain Inventory Average Pain 6 Pain Right Now 3 My pain is dull  In the last 24 hours, has pain interfered with the following? General activity 1 Relation with others 0 Enjoyment of life 0 What TIME of day is your pain at its worst? morning  and night Sleep (in general) Fair  Pain is worse with: walking and inactivity Pain improves with: heat/ice, TENS, and injections Relief from Meds:  na  Family History  Problem Relation Age of Onset   Breast cancer Sister 35   Hypertension Mother    Alzheimer's disease Mother    Dementia Mother    Hypertension Maternal Grandfather    Hyperlipidemia Maternal Grandfather    Birth defects Maternal Grandmother        Bladder   Colon cancer Maternal Grandmother    Congestive Heart Failure Father    Hyperlipidemia Father    Dementia Paternal Grandmother    Dementia Paternal Grandfather    Thyroid disease Sister    Social History   Socioeconomic History   Marital status: Widowed    Spouse name: Not on file   Number of children: 2   Years of education: Not on file   Highest education level: Master's degree (e.g., MA, MS, MEng, MEd, MSW, MBA)  Occupational History   Occupation: [employer] [place] [employer]    Comment: Full  Time    Employer: [employer]  Tobacco Use   Smoking status: Never   Smokeless tobacco: Never  Vaping Use   Vaping Use: Never used  Substance and Sexual Activity   Alcohol use: Yes    Comment: rare   Drug use: No   Sexual activity: Not Currently    Partners: Male  Other Topics Concern   Not on file  Social History Narrative   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      Regular Exercise -  NO   Daily Caffeine Use:  1 soda/tea          Social Determinants of Health   Financial Resource Strain: High Risk (03/05/2018)   Overall Financial Resource Strain (CARDIA)    Difficulty of Paying Living Expenses: Hard  Food Insecurity: No Food Insecurity (03/05/2018)   Hunger Vital Sign    Worried About Running Out of Food in the Last Year: Never true    Ran Out of Food in the Last Year: Never true  Transportation Needs: No Transportation Needs (03/05/2018)   PRAPARE - Administrator, Civil Service (Medical): No    Lack of Transportation (Non-Medical): No  Physical Activity: Insufficiently Active (03/05/2018)   Exercise Vital Sign    Days  of Exercise per Week: 3 days    Minutes of Exercise per Session: 20 min  Stress: No Stress Concern Present (03/05/2018)   [employer] of Occupational Health - Occupational Stress Questionnaire    Feeling of Stress : Not at all  Social Connections: Moderately Integrated (03/05/2018)   Social Connection and Isolation Panel [NHANES]    Frequency of Communication with Friends and Family: More than three times a week    Frequency of Social Gatherings with Friends and Family: Twice a week    Attends Religious Services: More than 4 times per year    Active Member of [employer] or Organizations: Yes    Attends Banker Meetings: More than 4 times per year    Marital Status: Widowed   Past Surgical History:  Procedure  Laterality Date   ABDOMINAL HYSTERECTOMY  2006   APPENDECTOMY  1980   CESAREAN SECTION  1981   COLONOSCOPY WITH PROPOFOL N/A 11/01/2014   Procedure: COLONOSCOPY WITH PROPOFOL;  Surgeon: Scot Jun, MD;  Location: [facility] ENDOSCOPY;  Service: Endoscopy;  Laterality: N/A;   COLONOSCOPY WITH PROPOFOL N/A 01/05/2020   Procedure: COLONOSCOPY WITH PROPOFOL;  Surgeon: Regis Bill, MD;  Location: [facility] ENDOSCOPY;  Service: Endoscopy;  Laterality: N/A;   ECTOPIC PREGNANCY SURGERY  1980   HERNIA REPAIR  2006   Umbilical   KNEE ARTHROSCOPY Left 2006   torn meniscus   TONSILLECTOMY  1971   TOTAL ABDOMINAL HYSTERECTOMY W/ BILATERAL SALPINGOOPHORECTOMY  2006   VESICOVAGINAL FISTULA CLOSURE W/ TAH     Past Surgical History:  Procedure Laterality Date   ABDOMINAL HYSTERECTOMY  2006   APPENDECTOMY  1980   CESAREAN SECTION  1981   COLONOSCOPY WITH PROPOFOL N/A 11/01/2014   Procedure: COLONOSCOPY WITH PROPOFOL;  Surgeon: Scot Jun, MD;  Location: [facility] ENDOSCOPY;  Service: Endoscopy;  Laterality: N/A;   COLONOSCOPY WITH PROPOFOL N/A 01/05/2020   Procedure: COLONOSCOPY WITH PROPOFOL;  Surgeon: Regis Bill, MD;  Location: [facility] ENDOSCOPY;  Service: Endoscopy;  Laterality: N/A;   ECTOPIC PREGNANCY SURGERY  1980   HERNIA REPAIR  2006   Umbilical   KNEE ARTHROSCOPY Left 2006   torn meniscus   TONSILLECTOMY  1971   TOTAL ABDOMINAL HYSTERECTOMY W/ BILATERAL SALPINGOOPHORECTOMY  2006   VESICOVAGINAL FISTULA CLOSURE W/ TAH     Past Medical History:  Diagnosis Date   Arthritis    pt reports in back & knees   Colon polyps 2010   At [facility]   Diverticulitis 2016   Genital warts    GERD (gastroesophageal reflux disease)    Heart murmur    Hepatitis B    History of blood transfusion    during each major surgery per pt   History of chicken pox    History of hiatal hernia    History of torn meniscus of right knee    HTN (hypertension)    Hx of migraine headaches    Hypertension     Hypopotassemia    BP 137/83   Pulse 70   Ht 5\' 2"  (1.575 m)   Wt 227 lb 6.4 oz (103.1 kg)   SpO2 99%   BMI 41.59 kg/m   Opioid Risk Score:   Fall Risk Score:  `1  Depression screen [facility] 2/9     11/23/2021   11:51 AM 11/06/2021   11:24 AM 10/06/2021   10:19 AM 09/19/2021   11:02 AM 08/24/2021   10:48 AM 08/18/2021   11:05 AM 07/21/2021  2:33 PM  Depression screen PHQ 2/9  Decreased Interest 0 0 0 0 0 0 0  Down, Depressed, Hopeless 0 0 0 1 0 0 0  PHQ - 2 Score 0 0 0 1 0 0 0  Altered sleeping  0  0  0 0  Tired, decreased energy  0  0  0 0  Change in appetite  0  0  0 0  Feeling bad or failure about yourself   0  0  0 0  Trouble concentrating  0  0  0 0  Moving slowly or fidgety/restless  0  0  0 0  Suicidal thoughts  0  0  0 0  PHQ-9 Score  0  1  0 0  Difficult doing work/chores  Not difficult at all  Not difficult at all  Not difficult at all Not difficult at all     Review of Systems  Constitutional: Negative.   HENT: Negative.    Eyes: Negative.   Respiratory: Negative.    Cardiovascular: Negative.   Gastrointestinal: Negative.   Endocrine: Negative.   Genitourinary: Negative.   Musculoskeletal:  Positive for back pain.       Left foot pain  Skin: Negative.   Allergic/Immunologic: Negative.   Neurological: Negative.   Hematological: Negative.   Psychiatric/Behavioral: Negative.    All other systems reviewed and are negative.      Objective:   Physical Exam Vitals and nursing note reviewed.  Constitutional:      Appearance: She is obese.  HENT:     Head: Normocephalic and atraumatic.  Eyes:     Extraocular Movements: Extraocular movements intact.     Conjunctiva/sclera: Conjunctivae normal.     Pupils: Pupils are equal, round, and reactive to light.  Musculoskeletal:     Comments: The patient has tightness of the Achilles tendon on the right side but no pain with passive dorsiflexion. No joint swelling at the right ankle.  No vasomotor changes right  lower extremity.  Neurological:     Mental Status: She is alert and oriented to person, place, and time.     Comments: 4/5 strength in the right hip flexor knee extensor 0/5 ankle dorsiflexor trace plantar flexor Tone MAS 3 at the ankle plantar flexors With standing patient has foot inversion especially when she removes weightbearing.  Also exhibits significant curling of digits 1 through 5. Sensation intact to light touch in the right leg and right foot Ambulates with right AFO as well as rolling walker no knee instability  Psychiatric:        Mood and Affect: Mood normal.        Behavior: Behavior normal.           Assessment & Plan:  1.  Right spastic hemiplegia no significant relief with toe flexor spasticity.  Foot inversion is quite significant and was not addressed with her first injection.  Will increase total dose of Xeomin adding the posterior tibialis as well as increasing dose to FHL.  We discussed that she may need an additional dosage escalation if this is not helpful.  We also discussed possibility of tibial nerve block RIGHT FHL 75 FDL 75 PT 50 Med Gastr 50 Lat Gastroc 50

## 2021-11-27 ENCOUNTER — Ambulatory Visit: Payer: Medicare Other | Admitting: Physical Therapy

## 2021-11-27 ENCOUNTER — Encounter: Payer: Medicare Other | Admitting: Occupational Therapy

## 2021-11-27 ENCOUNTER — Encounter: Payer: Self-pay | Admitting: Physical Therapy

## 2021-11-27 DIAGNOSIS — R262 Difficulty in walking, not elsewhere classified: Secondary | ICD-10-CM | POA: Diagnosis not present

## 2021-11-27 DIAGNOSIS — R2681 Unsteadiness on feet: Secondary | ICD-10-CM

## 2021-11-27 DIAGNOSIS — R269 Unspecified abnormalities of gait and mobility: Secondary | ICD-10-CM | POA: Diagnosis not present

## 2021-11-27 DIAGNOSIS — M6281 Muscle weakness (generalized): Secondary | ICD-10-CM | POA: Diagnosis not present

## 2021-11-27 DIAGNOSIS — I619 Nontraumatic intracerebral hemorrhage, unspecified: Secondary | ICD-10-CM | POA: Diagnosis not present

## 2021-11-27 NOTE — Therapy (Signed)
[place] [facility] SERVICES [address] [place], [place], [address] Phone: [phone]   Fax:  [phone]  Physical Therapy Treatment  Patient Details  Name: Terri Werner MRN: [id number] Date of Birth: [birth year] Referring Provider (PT): Jennings Books MD   Encounter Date: 11/27/2021   PT End of Session - 11/27/21 0807     Visit Number 16    Number of Visits 24    Date for PT Re-Evaluation 12/07/21    Authorization Type BCBS Medicare    Authorization Time Period 09/14/21 12/07/21    Progress Note Due on Visit 20    PT Start Time 0805    PT Stop Time 0845    PT Time Calculation (min) 40 min    Equipment Utilized During Treatment Gait belt    Activity Tolerance Patient tolerated treatment well;No increased pain    Behavior During Therapy [facility] for tasks assessed/performed             Past Medical History:  Diagnosis Date   Arthritis    pt reports in back & knees   Colon polyps 2010   At [facility]   Diverticulitis 2016   Genital warts    GERD (gastroesophageal reflux disease)    Heart murmur    Hepatitis B    History of blood transfusion    during each major surgery per pt   History of chicken pox    History of hiatal hernia    History of torn meniscus of right knee    HTN (hypertension)    Hx of migraine headaches    Hypertension    Hypopotassemia     Past Surgical History:  Procedure Laterality Date   ABDOMINAL HYSTERECTOMY  2006   [place]   COLONOSCOPY WITH PROPOFOL N/A 11/01/2014   Procedure: COLONOSCOPY WITH PROPOFOL;  Surgeon: Manya Silvas, MD;  Location: [place];  Service: Endoscopy;  Laterality: N/A;   COLONOSCOPY WITH PROPOFOL N/A 01/05/2020   Procedure: COLONOSCOPY WITH PROPOFOL;  Surgeon: [name], MD;  Location: [facility] ENDOSCOPY;  Service: Endoscopy;  Laterality: N/A;   [place]   HERNIA REPAIR  2707   Umbilical   KNEE  ARTHROSCOPY Left 2006   torn meniscus   TONSILLECTOMY  1971   TOTAL ABDOMINAL HYSTERECTOMY W/ BILATERAL SALPINGOOPHORECTOMY  2006   VESICOVAGINAL FISTULA CLOSURE W/ TAH      There were no vitals filed for this visit.      INTERVENTION: Gait around gym x3 laps (180 feet each lap) with RW and RLE AFO, with supervision with cues to increase RLE knee extension at terminal swing for better step length; Patient continues to have RLE flexor tone which limits step length;   Neuromuscular re-ed:      -Side step up and over orange hurdle with 2-1 UE support x 10 reps each side.with min VCS to increase hip flexion for better foot clearance;   -forward step over and back orange hurdle, airex to airex 1 UE support 10x each LE, very challenging RLE with increased circumduction requiring cues to increase hip flexion for better foot clearance;    Bosu ball: flat side down:  -forward modified lunge 10x each LE; heavy UE support   Standing on airex: -alternate toe tap to 6 inch step unsupported x10 reps; -Standing one foot on airex, one foot on 6 inch step: Unsupported standing 15 sec hold Progressed to passing cone  [place] [facility] SERVICES [address] [place], [place], [address] Phone: [phone]   Fax:  [phone]  Physical Therapy Treatment  Patient Details  Name: Terri Werner MRN: [id number] Date of Birth: [birth year] Referring Provider (PT): Jennings Books MD   Encounter Date: 11/27/2021   PT End of Session - 11/27/21 0807     Visit Number 16    Number of Visits 24    Date for PT Re-Evaluation 12/07/21    Authorization Type BCBS Medicare    Authorization Time Period 09/14/21 12/07/21    Progress Note Due on Visit 20    PT Start Time 0805    PT Stop Time 0845    PT Time Calculation (min) 40 min    Equipment Utilized During Treatment Gait belt    Activity Tolerance Patient tolerated treatment well;No increased pain    Behavior During Therapy [facility] for tasks assessed/performed             Past Medical History:  Diagnosis Date   Arthritis    pt reports in back & knees   Colon polyps 2010   At [facility]   Diverticulitis 2016   Genital warts    GERD (gastroesophageal reflux disease)    Heart murmur    Hepatitis B    History of blood transfusion    during each major surgery per pt   History of chicken pox    History of hiatal hernia    History of torn meniscus of right knee    HTN (hypertension)    Hx of migraine headaches    Hypertension    Hypopotassemia     Past Surgical History:  Procedure Laterality Date   ABDOMINAL HYSTERECTOMY  2006   [place]   COLONOSCOPY WITH PROPOFOL N/A 11/01/2014   Procedure: COLONOSCOPY WITH PROPOFOL;  Surgeon: Manya Silvas, MD;  Location: [facility];  Service: Endoscopy;  Laterality: N/A;   COLONOSCOPY WITH PROPOFOL N/A 01/05/2020   Procedure: COLONOSCOPY WITH PROPOFOL;  Surgeon: [name], MD;  Location: [facility] ENDOSCOPY;  Service: Endoscopy;  Laterality: N/A;   [place]   HERNIA REPAIR  2707   Umbilical   KNEE  ARTHROSCOPY Left 2006   torn meniscus   TONSILLECTOMY  1971   TOTAL ABDOMINAL HYSTERECTOMY W/ BILATERAL SALPINGOOPHORECTOMY  2006   VESICOVAGINAL FISTULA CLOSURE W/ TAH      There were no vitals filed for this visit.      INTERVENTION: Gait around gym x3 laps (180 feet each lap) with RW and RLE AFO, with supervision with cues to increase RLE knee extension at terminal swing for better step length; Patient continues to have RLE flexor tone which limits step length;   Neuromuscular re-ed:      -Side step up and over orange hurdle with 2-1 UE support x 10 reps each side.with min VCS to increase hip flexion for better foot clearance;   -forward step over and back orange hurdle, airex to airex 1 UE support 10x each LE, very challenging RLE with increased circumduction requiring cues to increase hip flexion for better foot clearance;    Bosu ball: flat side down:  -forward modified lunge 10x each LE; heavy UE support   Standing on airex: -alternate toe tap to 6 inch step unsupported x10 reps; -Standing one foot on airex, one foot on 6 inch step: Unsupported standing 15 sec hold Progressed to passing cone  [place] [facility] SERVICES [address] [place], [place], [address] Phone: [phone]   Fax:  [phone]  Physical Therapy Treatment  Patient Details  Name: Terri Werner MRN: [id number] Date of Birth: [birth year] Referring Provider (PT): Jennings Books MD   Encounter Date: 11/27/2021   PT End of Session - 11/27/21 0807     Visit Number 16    Number of Visits 24    Date for PT Re-Evaluation 12/07/21    Authorization Type BCBS Medicare    Authorization Time Period 09/14/21 12/07/21    Progress Note Due on Visit 20    PT Start Time 0805    PT Stop Time 0845    PT Time Calculation (min) 40 min    Equipment Utilized During Treatment Gait belt    Activity Tolerance Patient tolerated treatment well;No increased pain    Behavior During Therapy [facility] for tasks assessed/performed             Past Medical History:  Diagnosis Date   Arthritis    pt reports in back & knees   Colon polyps 2010   At [facility]   Diverticulitis 2016   Genital warts    GERD (gastroesophageal reflux disease)    Heart murmur    Hepatitis B    History of blood transfusion    during each major surgery per pt   History of chicken pox    History of hiatal hernia    History of torn meniscus of right knee    HTN (hypertension)    Hx of migraine headaches    Hypertension    Hypopotassemia     Past Surgical History:  Procedure Laterality Date   ABDOMINAL HYSTERECTOMY  2006   [place]   COLONOSCOPY WITH PROPOFOL N/A 11/01/2014   Procedure: COLONOSCOPY WITH PROPOFOL;  Surgeon: Manya Silvas, MD;  Location: [place];  Service: Endoscopy;  Laterality: N/A;   COLONOSCOPY WITH PROPOFOL N/A 01/05/2020   Procedure: COLONOSCOPY WITH PROPOFOL;  Surgeon: [name], MD;  Location: [facility] ENDOSCOPY;  Service: Endoscopy;  Laterality: N/A;   [place]   HERNIA REPAIR  2707   Umbilical   KNEE  ARTHROSCOPY Left 2006   torn meniscus   TONSILLECTOMY  1971   TOTAL ABDOMINAL HYSTERECTOMY W/ BILATERAL SALPINGOOPHORECTOMY  2006   VESICOVAGINAL FISTULA CLOSURE W/ TAH      There were no vitals filed for this visit.      INTERVENTION: Gait around gym x3 laps (180 feet each lap) with RW and RLE AFO, with supervision with cues to increase RLE knee extension at terminal swing for better step length; Patient continues to have RLE flexor tone which limits step length;   Neuromuscular re-ed:      -Side step up and over orange hurdle with 2-1 UE support x 10 reps each side.with min VCS to increase hip flexion for better foot clearance;   -forward step over and back orange hurdle, airex to airex 1 UE support 10x each LE, very challenging RLE with increased circumduction requiring cues to increase hip flexion for better foot clearance;    Bosu ball: flat side down:  -forward modified lunge 10x each LE; heavy UE support   Standing on airex: -alternate toe tap to 6 inch step unsupported x10 reps; -Standing one foot on airex, one foot on 6 inch step: Unsupported standing 15 sec hold Progressed to passing cone

## 2021-11-28 NOTE — Therapy (Signed)
OUTPATIENT PHYSICAL THERAPY TREATMENT NOTE   Patient Name: Terri Werner MRN: [id number] DOB:[birth year], 71 y.o., female Today's Date: 11/29/2021  PCP: Margarita Mail DO  REFERRING PROVIDER: Erick Colace MD    PT End of Session - 11/29/21 1019     Visit Number 17    Number of Visits 24    Date for PT Re-Evaluation 12/07/21    Authorization Type BCBS Medicare    Authorization Time Period 09/14/21 12/07/21    Progress Note Due on Visit 20    PT Start Time 1019    PT Stop Time 1101    PT Time Calculation (min) 42 min    Equipment Utilized During Treatment Gait belt    Activity Tolerance Patient tolerated treatment well;No increased pain    Behavior During Therapy [facility] for tasks assessed/performed                Past Medical History:  Diagnosis Date   Arthritis    pt reports in back & knees   Colon polyps 2010   At [facility]   Diverticulitis 2016   Genital warts    GERD (gastroesophageal reflux disease)    Heart murmur    Hepatitis B    History of blood transfusion    during each major surgery per pt   History of chicken pox    History of hiatal hernia    History of torn meniscus of right knee    HTN (hypertension)    Hx of migraine headaches    Hypertension    Hypopotassemia    Past Surgical History:  Procedure Laterality Date   ABDOMINAL HYSTERECTOMY  2006   APPENDECTOMY  1980   CESAREAN SECTION  1981   COLONOSCOPY WITH PROPOFOL N/A 11/01/2014   Procedure: COLONOSCOPY WITH PROPOFOL;  Surgeon: Scot Jun, MD;  Location: [facility] ENDOSCOPY;  Service: Endoscopy;  Laterality: N/A;   COLONOSCOPY WITH PROPOFOL N/A 01/05/2020   Procedure: COLONOSCOPY WITH PROPOFOL;  Surgeon: Regis Bill, MD;  Location: [facility] ENDOSCOPY;  Service: Endoscopy;  Laterality: N/A;   ECTOPIC PREGNANCY SURGERY  1980   HERNIA REPAIR  2006   [place]   KNEE ARTHROSCOPY Left 2006   torn meniscus   TONSILLECTOMY  1971   TOTAL ABDOMINAL HYSTERECTOMY W/ BILATERAL  SALPINGOOPHORECTOMY  2006   VESICOVAGINAL FISTULA CLOSURE W/ TAH     Patient Active Problem List   Diagnosis Date Noted   Hx of spontaneous intraparenchymal intracranial hemorrhage 07/12/2021   Intraparenchymal hemorrhage of brain (HCC) 05/31/2021   ICH (intracerebral hemorrhage) (HCC) 05/27/2021   Morbid obesity (HCC) 09/24/2018   Unspecified inflammatory spondylopathy, lumbar region (HCC) 03/05/2018   Lumbar spondylolysis 08/20/2017   Lumbar facet arthropathy 08/20/2017   Lumbar degenerative disc disease 08/20/2017   Chronic constipation 07/30/2017   Primary osteoarthritis involving multiple joints 04/17/2017   Postmenopausal 10/12/2016   Prediabetes 10/12/2016   Lumbago 08/04/2015   Diverticulitis of colon 07/05/2014   Carotid artery disorder (HCC) 04/27/2014   Hyperlipidemia 10/08/2013   Hypertension, benign 11/19/2009    REFERRING DIAG: R spastic hemiplegia   THERAPY DIAG:  Muscle weakness (generalized)  Unsteadiness on feet  Difficulty in walking, not elsewhere classified  Rationale for Evaluation and Treatment Rehabilitation  PERTINENT HISTORY: Terri Werner is a 71 y.o. right-handed female with history of hepatitis B, arthritis, diverticulitis, GERD, hyperlipidemia, hypertension. Patient is recently retired. She has a daughter and niece in the area. Presented 05/27/2021 with sudden onset of right leg weakness and mild headache.  Cranial CT scan showed acute parenchymal hemorrhage in the parasagittal left frontal parietal lobes with mild edema. No substantial mass effect. CT angiogram head and neck no abnormal vascularity in the region of hemorrhage no hemodynamically significant stenosis. MRI follow-up showed no mass effect seen underlying the left posterior frontal hematoma. Patient admitted to inpatient rehab on 05/31/21 and discharged on 06/27/21. Patient used home health at home that stopped two weeks ago. Patient has R foot drop. Has a wheelchair she does  household chores in, a RW, a quad cane and R AFO  PRECAUTIONS: fall  SUBJECTIVE:Patient reports pain in her back and leg. No falls or LOB since last session.   PAIN:  Are you having pain? Yes: NPRS scale: 5/10 Pain location: r knee Pain description: aching Aggravating factors: weightbearing, moving Relieving factors: rest     TODAY'S TREATMENT:        Neuromuscular re-ed:     -Side step up and over 1/2 bolster with 2-1 UE support x 10 reps each side.with min VCS to increase hip flexion for better foot clearance;   -forward step over and back 1/2 bolster 1-0 UE support 10x each LE, very challenging RLE with increased foot drag;    -weave between 5 cones with hurrycane  -forward backward chevrone pattern between 5 cones with hurrycane -squat and pick up 5 cones with hurrycane and pass to PT.   Standing on airex: -visual scan with reach for dual task cognitive game x 6 minutes     Three hedgehog tap with SUE support 10x each LE    Therex:   Three way hip hikes 10x each LE  Multiple sit to stands throughout session  Seated LAQ with ball adduction between feet 10x  Lateral step over hedgehog and back 10x each side     Education provided throughout session via VC/TC and demonstration to facilitate movement at target joints and correct muscle activation for all testing and exercises performed.       PATIENT EDUCATION: Education details: exercise technique Person educated: Patient Education method: Explanation, Demonstration, and Tactile cues Education comprehension: verbalized understanding, returned demonstration, verbal cues required, and tactile cues required   HOME EXERCISE PROGRAM: Continue as previously given  Added df/pf  exercise to HEP -11/02/21   PT Short Term Goals -      PT SHORT TERM GOAL #1   Title Patient will be independent in home exercise program to improve strength/mobility for better functional independence with ADLs.    Baseline 5/4: HEP  given 6/20: does HEP 2x a week    Time 4    Period Weeks    Status Achieved    Target Date 10/12/21              PT Long Term Goals       PT LONG TERM GOAL #1   Title Patient will increase FOTO score to equal to or greater than 63%    to demonstrate statistically significant improvement in mobility and quality of life.    Baseline 5/4: 48%, 6/20: 60%    Time 12    Period Weeks    Status Partially Met    Target Date 12/07/21      PT LONG TERM GOAL #2   Title Patient will increase 10 meter walk test to >1.74m/s as to improve gait speed for better community ambulation and to reduce fall risk.    Baseline 5/4: 0.54 m/s with RW, 6/20: 0.63 m/s    Time 12  Period Weeks    Status Partially Met    Target Date 12/07/21      PT LONG TERM GOAL #3   Title Patient will demonstrate an improved Berg Balance Score of >45/56 as to demonstrate improved balance with ADLs such as sitting/standing and transfer balance and reduced fall risk.    Baseline 5/4: 32/56, 6/20: 45/56    Time 12    Period Weeks    Status Partially Met    Target Date 12/07/21      PT LONG TERM GOAL #4   Title Patient will increase BLE gross strength to 4+/5 as to improve functional strength for independent gait, increased standing tolerance and increased ADL ability.    Baseline 5/4: see note, 6/20:R knee 4/5    Time 12    Period Weeks    Status Partially Met    Target Date 12/07/21                Plan     Clinical Impression Statement Patient presents with excellent motivation. She is challenged with coordination of RLE with limited spatial awareness of foot. Patient is able to ambulate backwards without LOB with use of AD. Patient would benefit from additional skilled PT intervention to improve strength, balance and gait safety;    Personal Factors and Comorbidities Comorbidity 3+;Fitness;Past/Current Experience;Time since onset of injury/illness/exacerbation;Transportation    Comorbidities hepatitis  B, arthritis, diverticulitis, GERD, hyperlipidemia, hypertension    Examination-Activity Limitations Bed Mobility;Caring for [employer];Locomotion Level;Lift;Squat;Stairs;Stand;Toileting;Transfers    Examination-Participation Restrictions Cleaning;Driving;Meal Prep;Laundry;Shop;Volunteer;Yard Work    Conservation officer, historic buildings Evolving/Moderate complexity    Rehab Potential Fair    PT Frequency 2x / week    PT Duration 12 weeks    PT Treatment/Interventions ADLs/Self Care Home Management;Canalith Repostioning;Cryotherapy;Electrical Stimulation;Iontophoresis 4mg /ml Dexamethasone;Moist Heat;Traction;Ultrasound;Functional mobility training;Stair training;Gait training;DME Instruction;Therapeutic activities;Therapeutic exercise;Balance training;Neuromuscular re-education;Manual techniques;Orthotic Fit/Training;Patient/family education;Passive range of motion;Dry needling;Taping;Splinting;Energy conservation;Visual/perceptual remediation/compensation;Vestibular    PT Next Visit Plan progress stabilization and strength    PT Home Exercise Plan see above    Consulted and Agree with Plan of Care Patient              Precious Bard, PT, DPT   11/29/2021, 11:09 AM

## 2021-11-29 ENCOUNTER — Ambulatory Visit: Payer: Medicare Other

## 2021-11-29 ENCOUNTER — Encounter: Payer: Medicare Other | Admitting: Occupational Therapy

## 2021-11-29 DIAGNOSIS — R2681 Unsteadiness on feet: Secondary | ICD-10-CM

## 2021-11-29 DIAGNOSIS — R262 Difficulty in walking, not elsewhere classified: Secondary | ICD-10-CM | POA: Diagnosis not present

## 2021-11-29 DIAGNOSIS — I619 Nontraumatic intracerebral hemorrhage, unspecified: Secondary | ICD-10-CM | POA: Diagnosis not present

## 2021-11-29 DIAGNOSIS — M6281 Muscle weakness (generalized): Secondary | ICD-10-CM | POA: Diagnosis not present

## 2021-11-29 DIAGNOSIS — R269 Unspecified abnormalities of gait and mobility: Secondary | ICD-10-CM | POA: Diagnosis not present

## 2021-11-30 ENCOUNTER — Ambulatory Visit
Admission: RE | Admit: 2021-11-30 | Discharge: 2021-11-30 | Disposition: A | Discharge status: Home / Self Care | Payer: Medicare Other | Source: Ambulatory Visit | Attending: Internal Medicine | Admitting: Internal Medicine

## 2021-11-30 DIAGNOSIS — Z1231 Encounter for screening mammogram for malignant neoplasm of breast: Secondary | ICD-10-CM | POA: Diagnosis not present

## 2021-11-30 NOTE — Therapy (Signed)
OUTPATIENT PHYSICAL THERAPY TREATMENT NOTE   Patient Name: Terri Werner MRN: [id number] DOB:12-08-1950, 71 y.o., female Today's Date: 12/04/2021  PCP: Margarita Mail DO  REFERRING PROVIDER: Erick Colace MD    PT End of Session - 12/04/21 0929     Visit Number 18    Number of Visits 24    Date for PT Re-Evaluation 12/07/21    Authorization Type BCBS Medicare    Authorization Time Period 09/14/21 12/07/21    Progress Note Due on Visit 20    PT Start Time 0930    PT Stop Time 1014    PT Time Calculation (min) 44 min    Equipment Utilized During Treatment Gait belt    Activity Tolerance Patient tolerated treatment well;No increased pain    Behavior During Therapy [facility] for tasks assessed/performed                 Past Medical History:  Diagnosis Date   Arthritis    pt reports in back & knees   Colon polyps 2010   At [facility]   Diverticulitis 2016   Genital warts    GERD (gastroesophageal reflux disease)    Heart murmur    Hepatitis B    History of blood transfusion    during each major surgery per pt   History of chicken pox    History of hiatal hernia    History of torn meniscus of right knee    HTN (hypertension)    Hx of migraine headaches    Hypertension    Hypopotassemia    Past Surgical History:  Procedure Laterality Date   ABDOMINAL HYSTERECTOMY  2006   APPENDECTOMY  1980   CESAREAN SECTION  1981   COLONOSCOPY WITH PROPOFOL N/A 11/01/2014   Procedure: COLONOSCOPY WITH PROPOFOL;  Surgeon: Scot Jun, MD;  Location: [facility] ENDOSCOPY;  Service: Endoscopy;  Laterality: N/A;   COLONOSCOPY WITH PROPOFOL N/A 01/05/2020   Procedure: COLONOSCOPY WITH PROPOFOL;  Surgeon: Regis Bill, MD;  Location: [facility] ENDOSCOPY;  Service: Endoscopy;  Laterality: N/A;   ECTOPIC PREGNANCY SURGERY  1980   HERNIA REPAIR  2006   Umbilical   KNEE ARTHROSCOPY Left 2006   torn meniscus   TONSILLECTOMY  1971   TOTAL ABDOMINAL HYSTERECTOMY W/ BILATERAL  SALPINGOOPHORECTOMY  2006   VESICOVAGINAL FISTULA CLOSURE W/ TAH     Patient Active Problem List   Diagnosis Date Noted   Hx of spontaneous intraparenchymal intracranial hemorrhage 07/12/2021   Intraparenchymal hemorrhage of brain (HCC) 05/31/2021   ICH (intracerebral hemorrhage) (HCC) 05/27/2021   Morbid obesity (HCC) 09/24/2018   Unspecified inflammatory spondylopathy, lumbar region (HCC) 03/05/2018   Lumbar spondylolysis 08/20/2017   Lumbar facet arthropathy 08/20/2017   Lumbar degenerative disc disease 08/20/2017   Chronic constipation 07/30/2017   Primary osteoarthritis involving multiple joints 04/17/2017   Postmenopausal 10/12/2016   Prediabetes 10/12/2016   Lumbago 08/04/2015   Diverticulitis of colon 07/05/2014   Carotid artery disorder (HCC) 04/27/2014   Hyperlipidemia 10/08/2013   Hypertension, benign 11/19/2009    REFERRING DIAG: R spastic hemiplegia   THERAPY DIAG:  Muscle weakness (generalized)  Unsteadiness on feet  Difficulty in walking, not elsewhere classified  Rationale for Evaluation and Treatment Rehabilitation  PERTINENT HISTORY: Terri Werner is a 71 y.o. right-handed female with history of hepatitis B, arthritis, diverticulitis, GERD, hyperlipidemia, hypertension. Patient is recently retired. She has a daughter and niece in the area. Presented 05/27/2021 with sudden onset of right leg weakness and mild  headache. Cranial CT scan showed acute parenchymal hemorrhage in the parasagittal left frontal parietal lobes with mild edema. No substantial mass effect. CT angiogram head and neck no abnormal vascularity in the region of hemorrhage no hemodynamically significant stenosis. MRI follow-up showed no mass effect seen underlying the left posterior frontal hematoma. Patient admitted to inpatient rehab on 05/31/21 and discharged on 06/27/21. Patient used home health at home that stopped two weeks ago. Patient has R foot drop. Has a wheelchair she does  household chores in, a RW, a quad cane and R AFO  PRECAUTIONS: fall  SUBJECTIVE:Patient walked down with quad cane. Has been using it all weekend.  PAIN:  Are you having pain? Yes: NPRS scale: 5/10 Pain location: r knee Pain description: aching Aggravating factors: weightbearing, moving Relieving factors: rest     TODAY'S TREATMENT:        Neuromuscular re-ed:   Airex pad: static stand with visual scan and reaching to sort letters into alphabetical order. Challenging for patient to perform with frequent dropping of letters and limited left visual scan. X8 minutes    -ambulate in hallway with hurrycane: -horizontal head turns with cues for reading alphabet from cards 86 ftx 2 sets -horizontal head turns with cues for reading number and symbols from cards 86 ft x 2 sets  -"red light/green light" for sudden initiation/termination of ambulation with close CGA for carryover to natural environment 2x 86 ft      Therex:  Nustep level 3 seat position 8 RPM>70 for 4  minutes 10x STS Seated LAQ with ball adduction between feet 10x  Adduction ball squeeze 12x 3 second holds  March with GTB across toes 10x each LE GTB hamstring curl 12x each LE        Education provided throughout session via VC/TC and demonstration to facilitate movement at target joints and correct muscle activation for all testing and exercises performed.       PATIENT EDUCATION: Education details: exercise technique Person educated: Patient Education method: Explanation, Demonstration, and Tactile cues Education comprehension: verbalized understanding, returned demonstration, verbal cues required, and tactile cues required   HOME EXERCISE PROGRAM: Continue as previously given  Added df/pf  exercise to HEP -11/02/21   PT Short Term Goals -      PT SHORT TERM GOAL #1   Title Patient will be independent in home exercise program to improve strength/mobility for better functional independence with  ADLs.    Baseline 5/4: HEP given 6/20: does HEP 2x a week    Time 4    Period Weeks    Status Achieved    Target Date 10/12/21              PT Long Term Goals       PT LONG TERM GOAL #1   Title Patient will increase FOTO score to equal to or greater than 63%    to demonstrate statistically significant improvement in mobility and quality of life.    Baseline 5/4: 48%, 6/20: 60%    Time 12    Period Weeks    Status Partially Met    Target Date 12/07/21      PT LONG TERM GOAL #2   Title Patient will increase 10 meter walk test to >1.71m/s as to improve gait speed for better community ambulation and to reduce fall risk.    Baseline 5/4: 0.54 m/s with RW, 6/20: 0.63 m/s    Time 12    Period Weeks    Status  Partially Met    Target Date 12/07/21      PT LONG TERM GOAL #3   Title Patient will demonstrate an improved Berg Balance Score of >45/56 as to demonstrate improved balance with ADLs such as sitting/standing and transfer balance and reduced fall risk.    Baseline 5/4: 32/56, 6/20: 45/56    Time 12    Period Weeks    Status Partially Met    Target Date 12/07/21      PT LONG TERM GOAL #4   Title Patient will increase BLE gross strength to 4+/5 as to improve functional strength for independent gait, increased standing tolerance and increased ADL ability.    Baseline 5/4: see note, 6/20:R knee 4/5    Time 12    Period Weeks    Status Partially Met    Target Date 12/07/21                Plan     Clinical Impression Statement Patient is challenged with visual scan to the left that requires occasional cueing with dual task. Patient does have foot drag with prolonged ambulation with limited foot clearance with fatigue. She remains highly motivated throughout session and eager for progression of interventions.  Patient would benefit from additional skilled PT intervention to improve strength, balance and gait safety;    Personal Factors and Comorbidities Comorbidity  3+;Fitness;Past/Current Experience;Time since onset of injury/illness/exacerbation;Transportation    Comorbidities hepatitis B, arthritis, diverticulitis, GERD, hyperlipidemia, hypertension    Examination-Activity Limitations Bed Mobility;Caring for [employer];Locomotion Level;Lift;Squat;Stairs;Stand;Toileting;Transfers    Examination-Participation Restrictions Cleaning;Driving;Meal Prep;Laundry;Shop;Volunteer;Yard Work    Conservation officer, historic buildings Evolving/Moderate complexity    Rehab Potential Fair    PT Frequency 2x / week    PT Duration 12 weeks    PT Treatment/Interventions ADLs/Self Care Home Management;Canalith Repostioning;Cryotherapy;Electrical Stimulation;Iontophoresis 4mg /ml Dexamethasone;Moist Heat;Traction;Ultrasound;Functional mobility training;Stair training;Gait training;DME Instruction;Therapeutic activities;Therapeutic exercise;Balance training;Neuromuscular re-education;Manual techniques;Orthotic Fit/Training;Patient/family education;Passive range of motion;Dry needling;Taping;Splinting;Energy conservation;Visual/perceptual remediation/compensation;Vestibular    PT Next Visit Plan progress stabilization and strength    PT Home Exercise Plan see above    Consulted and Agree with Plan of Care Patient              Precious Bard, PT, DPT   12/04/2021, 10:15 AM

## 2021-12-04 ENCOUNTER — Ambulatory Visit: Payer: Medicare Other

## 2021-12-04 DIAGNOSIS — R2681 Unsteadiness on feet: Secondary | ICD-10-CM

## 2021-12-04 DIAGNOSIS — R262 Difficulty in walking, not elsewhere classified: Secondary | ICD-10-CM | POA: Diagnosis not present

## 2021-12-04 DIAGNOSIS — M6281 Muscle weakness (generalized): Secondary | ICD-10-CM | POA: Diagnosis not present

## 2021-12-04 DIAGNOSIS — I619 Nontraumatic intracerebral hemorrhage, unspecified: Secondary | ICD-10-CM | POA: Diagnosis not present

## 2021-12-04 DIAGNOSIS — R269 Unspecified abnormalities of gait and mobility: Secondary | ICD-10-CM | POA: Diagnosis not present

## 2021-12-06 ENCOUNTER — Ambulatory Visit: Payer: Medicare Other | Admitting: Physical Therapy

## 2021-12-06 DIAGNOSIS — R262 Difficulty in walking, not elsewhere classified: Secondary | ICD-10-CM

## 2021-12-06 DIAGNOSIS — R2681 Unsteadiness on feet: Secondary | ICD-10-CM

## 2021-12-06 DIAGNOSIS — R269 Unspecified abnormalities of gait and mobility: Secondary | ICD-10-CM | POA: Diagnosis not present

## 2021-12-06 DIAGNOSIS — I619 Nontraumatic intracerebral hemorrhage, unspecified: Secondary | ICD-10-CM | POA: Diagnosis not present

## 2021-12-06 DIAGNOSIS — M6281 Muscle weakness (generalized): Secondary | ICD-10-CM

## 2021-12-06 NOTE — Therapy (Signed)
OUTPATIENT PHYSICAL THERAPY TREATMENT NOTE   Patient Name: Terri Werner MRN: [id number] DOB:08/24/1950, 70 y.o., female Today's Date: 12/06/2021  PCP: Andrews, Elisabeth DO  REFERRING PROVIDER: Kirsteins, Andrew E MD    PT End of Session - 12/06/21 0912     Visit Number 19    Number of Visits 24    Date for PT Re-Evaluation 12/07/21    Authorization Type BCBS Medicare    Authorization Time Period 09/14/21 12/07/21    Progress Note Due on Visit 20    PT Start Time 0932    PT Stop Time 1015    PT Time Calculation (min) 43 min    Equipment Utilized During Treatment Gait belt    Activity Tolerance Patient tolerated treatment well;No increased pain    Behavior During Therapy WFL for tasks assessed/performed                 Past Medical History:  Diagnosis Date   Arthritis    pt reports in back & knees   Colon polyps 2010   At [facility]   Diverticulitis 2016   Genital warts    GERD (gastroesophageal reflux disease)    Heart murmur    Hepatitis B    History of blood transfusion    during each major surgery per pt   History of chicken pox    History of hiatal hernia    History of torn meniscus of right knee    HTN (hypertension)    Hx of migraine headaches    Hypertension    Hypopotassemia    Past Surgical History:  Procedure Laterality Date   ABDOMINAL HYSTERECTOMY  2006   APPENDECTOMY  1980   CESAREAN SECTION  1981   COLONOSCOPY WITH PROPOFOL N/A 11/01/2014   Procedure: COLONOSCOPY WITH PROPOFOL;  Surgeon: Robert T Elliott, MD;  Location: [facility] ENDOSCOPY;  Service: Endoscopy;  Laterality: N/A;   COLONOSCOPY WITH PROPOFOL N/A 01/05/2020   Procedure: COLONOSCOPY WITH PROPOFOL;  Surgeon: Locklear, Cameron T, MD;  Location: [facility] ENDOSCOPY;  Service: Endoscopy;  Laterality: N/A;   ECTOPIC PREGNANCY SURGERY  1980   HERNIA REPAIR  2006   Umbilical   KNEE ARTHROSCOPY Left 2006   torn meniscus   TONSILLECTOMY  1971   TOTAL ABDOMINAL HYSTERECTOMY W/ BILATERAL  SALPINGOOPHORECTOMY  2006   VESICOVAGINAL FISTULA CLOSURE W/ TAH     Patient Active Problem List   Diagnosis Date Noted   Hx of spontaneous intraparenchymal intracranial hemorrhage 07/12/2021   Intraparenchymal hemorrhage of brain (HCC) 05/31/2021   ICH (intracerebral hemorrhage) (HCC) 05/27/2021   Morbid obesity (HCC) 09/24/2018   Unspecified inflammatory spondylopathy, lumbar region (HCC) 03/05/2018   Lumbar spondylolysis 08/20/2017   Lumbar facet arthropathy 08/20/2017   Lumbar degenerative disc disease 08/20/2017   Chronic constipation 07/30/2017   Primary osteoarthritis involving multiple joints 04/17/2017   Postmenopausal 10/12/2016   Prediabetes 10/12/2016   Lumbago 08/04/2015   Diverticulitis of colon 07/05/2014   Carotid artery disorder (HCC) 04/27/2014   Hyperlipidemia 10/08/2013   Hypertension, benign 11/19/2009    REFERRING DIAG: R spastic hemiplegia   THERAPY DIAG:  Muscle weakness (generalized)  Unsteadiness on feet  Difficulty in walking, not elsewhere classified  Rationale for Evaluation and Treatment Rehabilitation  PERTINENT HISTORY: Terri Werner is a 70 y.o. right-handed female with history of hepatitis B, arthritis, diverticulitis, GERD, hyperlipidemia, hypertension. Patient is recently retired. She has a daughter and niece in the area. Presented 05/27/2021 with sudden onset of right leg weakness and mild   OUTPATIENT PHYSICAL THERAPY TREATMENT NOTE   Patient Name: Terri Werner MRN: [id number] DOB:08/24/1950, 70 y.o., female Today's Date: 12/06/2021  PCP: Andrews, Elisabeth DO  REFERRING PROVIDER: Kirsteins, Andrew E MD    PT End of Session - 12/06/21 0912     Visit Number 19    Number of Visits 24    Date for PT Re-Evaluation 12/07/21    Authorization Type BCBS Medicare    Authorization Time Period 09/14/21 12/07/21    Progress Note Due on Visit 20    PT Start Time 0932    PT Stop Time 1015    PT Time Calculation (min) 43 min    Equipment Utilized During Treatment Gait belt    Activity Tolerance Patient tolerated treatment well;No increased pain    Behavior During Therapy WFL for tasks assessed/performed                 Past Medical History:  Diagnosis Date   Arthritis    pt reports in back & knees   Colon polyps 2010   At [facility]   Diverticulitis 2016   Genital warts    GERD (gastroesophageal reflux disease)    Heart murmur    Hepatitis B    History of blood transfusion    during each major surgery per pt   History of chicken pox    History of hiatal hernia    History of torn meniscus of right knee    HTN (hypertension)    Hx of migraine headaches    Hypertension    Hypopotassemia    Past Surgical History:  Procedure Laterality Date   ABDOMINAL HYSTERECTOMY  2006   APPENDECTOMY  1980   CESAREAN SECTION  1981   COLONOSCOPY WITH PROPOFOL N/A 11/01/2014   Procedure: COLONOSCOPY WITH PROPOFOL;  Surgeon: Robert T Elliott, MD;  Location: [facility] ENDOSCOPY;  Service: Endoscopy;  Laterality: N/A;   COLONOSCOPY WITH PROPOFOL N/A 01/05/2020   Procedure: COLONOSCOPY WITH PROPOFOL;  Surgeon: Locklear, Cameron T, MD;  Location: [facility] ENDOSCOPY;  Service: Endoscopy;  Laterality: N/A;   ECTOPIC PREGNANCY SURGERY  1980   HERNIA REPAIR  2006   Umbilical   KNEE ARTHROSCOPY Left 2006   torn meniscus   TONSILLECTOMY  1971   TOTAL ABDOMINAL HYSTERECTOMY W/ BILATERAL  SALPINGOOPHORECTOMY  2006   VESICOVAGINAL FISTULA CLOSURE W/ TAH     Patient Active Problem List   Diagnosis Date Noted   Hx of spontaneous intraparenchymal intracranial hemorrhage 07/12/2021   Intraparenchymal hemorrhage of brain (HCC) 05/31/2021   ICH (intracerebral hemorrhage) (HCC) 05/27/2021   Morbid obesity (HCC) 09/24/2018   Unspecified inflammatory spondylopathy, lumbar region (HCC) 03/05/2018   Lumbar spondylolysis 08/20/2017   Lumbar facet arthropathy 08/20/2017   Lumbar degenerative disc disease 08/20/2017   Chronic constipation 07/30/2017   Primary osteoarthritis involving multiple joints 04/17/2017   Postmenopausal 10/12/2016   Prediabetes 10/12/2016   Lumbago 08/04/2015   Diverticulitis of colon 07/05/2014   Carotid artery disorder (HCC) 04/27/2014   Hyperlipidemia 10/08/2013   Hypertension, benign 11/19/2009    REFERRING DIAG: R spastic hemiplegia   THERAPY DIAG:  Muscle weakness (generalized)  Unsteadiness on feet  Difficulty in walking, not elsewhere classified  Rationale for Evaluation and Treatment Rehabilitation  PERTINENT HISTORY: Terri Werner is a 70 y.o. right-handed female with history of hepatitis B, arthritis, diverticulitis, GERD, hyperlipidemia, hypertension. Patient is recently retired. She has a daughter and niece in the area. Presented 05/27/2021 with sudden onset of right leg weakness and mild   OUTPATIENT PHYSICAL THERAPY TREATMENT NOTE   Patient Name: Terri Werner MRN: [id number] DOB:08/09/1950, 70 y.o., female Today's Date: 12/06/2021  PCP: Andrews, Elisabeth DO  REFERRING PROVIDER: Kirsteins, Andrew E MD    PT End of Session - 12/06/21 0912     Visit Number 19    Number of Visits 24    Date for PT Re-Evaluation 12/07/21    Authorization Type BCBS Medicare    Authorization Time Period 09/14/21 12/07/21    Progress Note Due on Visit 20    PT Start Time 0932    PT Stop Time 1015    PT Time Calculation (min) 43 min    Equipment Utilized During Treatment Gait belt    Activity Tolerance Patient tolerated treatment well;No increased pain    Behavior During Therapy WFL for tasks assessed/performed                 Past Medical History:  Diagnosis Date   Arthritis    pt reports in back & knees   Colon polyps 2010   At [facility]   Diverticulitis 2016   Genital warts    GERD (gastroesophageal reflux disease)    Heart murmur    Hepatitis B    History of blood transfusion    during each major surgery per pt   History of chicken pox    History of hiatal hernia    History of torn meniscus of right knee    HTN (hypertension)    Hx of migraine headaches    Hypertension    Hypopotassemia    Past Surgical History:  Procedure Laterality Date   ABDOMINAL HYSTERECTOMY  2006   APPENDECTOMY  1980   CESAREAN SECTION  1981   COLONOSCOPY WITH PROPOFOL N/A 11/01/2014   Procedure: COLONOSCOPY WITH PROPOFOL;  Surgeon: Robert T Elliott, MD;  Location: [facility] ENDOSCOPY;  Service: Endoscopy;  Laterality: N/A;   COLONOSCOPY WITH PROPOFOL N/A 01/05/2020   Procedure: COLONOSCOPY WITH PROPOFOL;  Surgeon: Locklear, Cameron T, MD;  Location: [facility] ENDOSCOPY;  Service: Endoscopy;  Laterality: N/A;   ECTOPIC PREGNANCY SURGERY  1980   HERNIA REPAIR  2006   Umbilical   KNEE ARTHROSCOPY Left 2006   torn meniscus   TONSILLECTOMY  1971   TOTAL ABDOMINAL HYSTERECTOMY W/ BILATERAL  SALPINGOOPHORECTOMY  2006   VESICOVAGINAL FISTULA CLOSURE W/ TAH     Patient Active Problem List   Diagnosis Date Noted   Hx of spontaneous intraparenchymal intracranial hemorrhage 07/12/2021   Intraparenchymal hemorrhage of brain (HCC) 05/31/2021   ICH (intracerebral hemorrhage) (HCC) 05/27/2021   Morbid obesity (HCC) 09/24/2018   Unspecified inflammatory spondylopathy, lumbar region (HCC) 03/05/2018   Lumbar spondylolysis 08/20/2017   Lumbar facet arthropathy 08/20/2017   Lumbar degenerative disc disease 08/20/2017   Chronic constipation 07/30/2017   Primary osteoarthritis involving multiple joints 04/17/2017   Postmenopausal 10/12/2016   Prediabetes 10/12/2016   Lumbago 08/04/2015   Diverticulitis of colon 07/05/2014   Carotid artery disorder (HCC) 04/27/2014   Hyperlipidemia 10/08/2013   Hypertension, benign 11/19/2009    REFERRING DIAG: R spastic hemiplegia   THERAPY DIAG:  Muscle weakness (generalized)  Unsteadiness on feet  Difficulty in walking, not elsewhere classified  Rationale for Evaluation and Treatment Rehabilitation  PERTINENT HISTORY: Terri Werner is a 70 y.o. right-handed female with history of hepatitis B, arthritis, diverticulitis, GERD, hyperlipidemia, hypertension. Patient is recently retired. She has a daughter and niece in the area. Presented 05/27/2021 with sudden onset of right leg weakness and mild

## 2021-12-11 ENCOUNTER — Ambulatory Visit: Payer: Medicare Other | Admitting: Physical Therapy

## 2021-12-11 ENCOUNTER — Encounter: Payer: Self-pay | Admitting: Physical Therapy

## 2021-12-11 DIAGNOSIS — M6281 Muscle weakness (generalized): Secondary | ICD-10-CM | POA: Diagnosis not present

## 2021-12-11 DIAGNOSIS — R269 Unspecified abnormalities of gait and mobility: Secondary | ICD-10-CM | POA: Diagnosis not present

## 2021-12-11 DIAGNOSIS — M25551 Pain in right hip: Secondary | ICD-10-CM | POA: Diagnosis not present

## 2021-12-11 DIAGNOSIS — R262 Difficulty in walking, not elsewhere classified: Secondary | ICD-10-CM | POA: Diagnosis not present

## 2021-12-11 DIAGNOSIS — R2681 Unsteadiness on feet: Secondary | ICD-10-CM | POA: Diagnosis not present

## 2021-12-11 DIAGNOSIS — I619 Nontraumatic intracerebral hemorrhage, unspecified: Secondary | ICD-10-CM | POA: Diagnosis not present

## 2021-12-11 NOTE — Therapy (Signed)
OUTPATIENT PHYSICAL THERAPY TREATMENT NOTE Physical Therapy Progress Note   Dates of reporting period  10/31/21   to   12/11/21    Patient Name: Terri Werner MRN: [id number] DOB:21-Sep-1950, 71 y.o., female Today's Date: 12/11/2021  PCP: Margarita Mail DO  REFERRING PROVIDER: Erick Colace MD    PT End of Session - 12/11/21 0851     Visit Number 20    Number of Visits 32    Date for PT Re-Evaluation 02/05/22    Authorization Type BCBS Medicare    Authorization Time Period 09/14/21 12/07/21    Progress Note Due on Visit 20    PT Start Time 0848    PT Stop Time 0930    PT Time Calculation (min) 42 min    Equipment Utilized During Treatment Gait belt    Activity Tolerance Patient tolerated treatment well;No increased pain    Behavior During Therapy [facility] for tasks assessed/performed                 Past Medical History:  Diagnosis Date   Arthritis    pt reports in back & knees   Colon polyps 2010   At [facility]   Diverticulitis 2016   Genital warts    GERD (gastroesophageal reflux disease)    Heart murmur    Hepatitis B    History of blood transfusion    during each major surgery per pt   History of chicken pox    History of hiatal hernia    History of torn meniscus of right knee    HTN (hypertension)    Hx of migraine headaches    Hypertension    Hypopotassemia    Past Surgical History:  Procedure Laterality Date   ABDOMINAL HYSTERECTOMY  2006   APPENDECTOMY  1980   CESAREAN SECTION  1981   COLONOSCOPY WITH PROPOFOL N/A 11/01/2014   Procedure: COLONOSCOPY WITH PROPOFOL;  Surgeon: Scot Jun, MD;  Location: [facility] ENDOSCOPY;  Service: Endoscopy;  Laterality: N/A;   COLONOSCOPY WITH PROPOFOL N/A 01/05/2020   Procedure: COLONOSCOPY WITH PROPOFOL;  Surgeon: Regis Bill, MD;  Location: [facility] ENDOSCOPY;  Service: Endoscopy;  Laterality: N/A;   ECTOPIC PREGNANCY SURGERY  1980   HERNIA REPAIR  2006   Umbilical   KNEE ARTHROSCOPY Left  2006   torn meniscus   TONSILLECTOMY  1971   TOTAL ABDOMINAL HYSTERECTOMY W/ BILATERAL SALPINGOOPHORECTOMY  2006   VESICOVAGINAL FISTULA CLOSURE W/ TAH     Patient Active Problem List   Diagnosis Date Noted   Hx of spontaneous intraparenchymal intracranial hemorrhage 07/12/2021   Intraparenchymal hemorrhage of brain (HCC) 05/31/2021   ICH (intracerebral hemorrhage) (HCC) 05/27/2021   Morbid obesity (HCC) 09/24/2018   Unspecified inflammatory spondylopathy, lumbar region (HCC) 03/05/2018   Lumbar spondylolysis 08/20/2017   Lumbar facet arthropathy 08/20/2017   Lumbar degenerative disc disease 08/20/2017   Chronic constipation 07/30/2017   Primary osteoarthritis involving multiple joints 04/17/2017   Postmenopausal 10/12/2016   Prediabetes 10/12/2016   Lumbago 08/04/2015   Diverticulitis of colon 07/05/2014   Carotid artery disorder (HCC) 04/27/2014   Hyperlipidemia 10/08/2013   Hypertension, benign 11/19/2009    REFERRING DIAG: R spastic hemiplegia   THERAPY DIAG:  Muscle weakness (generalized)  Unsteadiness on feet  Difficulty in walking, not elsewhere classified  Rationale for Evaluation and Treatment Rehabilitation  PERTINENT HISTORY: HARRIETT ANTILL is a 71 y.o. right-handed female with history of hepatitis B, arthritis, diverticulitis, GERD, hyperlipidemia, hypertension. Patient is recently retired. She  has a daughter and niece in the area. Presented 05/27/2021 with sudden onset of right leg weakness and mild headache. Cranial CT scan showed acute parenchymal hemorrhage in the parasagittal left frontal parietal lobes with mild edema. No substantial mass effect. CT angiogram head and neck no abnormal vascularity in the region of hemorrhage no hemodynamically significant stenosis. MRI follow-up showed no mass effect seen underlying the left posterior frontal hematoma. Patient admitted to inpatient rehab on 05/31/21 and discharged on 06/27/21. Patient used home health at  home that stopped two weeks ago. Patient has R foot drop. Has a wheelchair she does household chores in, a RW, a quad cane and R AFO  PRECAUTIONS: fall  SUBJECTIVE:Patient walked down with quad cane. Has been using it all weekend. Reports a little stiffness after sitting for a while.   PAIN:  Are you having pain? No     TODAY'S TREATMENT:  12/11/2021   TREATMENT: Warm up on Nustep BUE/BLE level 3 x5 min with cues to keep steps per minute >60 for cardiovascular challenge; SPo2 98%, HR 104 bpm;       PT assessed goals, see below;   She does exhibit elevated BP after 6 min walk test, after short seated rest break, BP did come back down to 109/68- unsure if elevated BP could have been error from moving UE, or if that is just a quick elevation due to fatigue; Will continue to monitor   [facility] PT Assessment - 12/11/21 0001       Observation/Other Assessments   Focus on Therapeutic Outcomes (FOTO)  60%      6 Minute Walk- Baseline   BP (mmHg) 110/69    HR (bpm) 90    Modified Borg Scale for Dyspnea 0- Nothing at all      6 Minute walk- Post Test   BP (mmHg) 152/134    HR (bpm) 84    Modified Borg Scale for Dyspnea 0.5- Very, very slight shortness of breath      6 minute walk test results    Aerobic Endurance Distance Walked 760    Endurance additional comments with RW, improved from 635 on 10/31/21, less than community ambulator of 1000 feet, also exhibits elevated BP      Standardized Balance Assessment   Standardized Balance Assessment 10 meter walk test    10 Meter Walk 0.57 m/s with tripod cane, limited home ambulator, slightly slower than 10/31/21 which was 0.63 m/s on 6/20 with RW      Berg Balance Test   Sit to Stand Able to stand without using hands and stabilize independently    Standing Unsupported Able to stand safely 2 minutes    Sitting with Back Unsupported but Feet Supported on Floor or Stool Able to sit safely and securely 2 minutes    Stand to Sit Sits  safely with minimal use of hands    Transfers Able to transfer safely, minor use of hands    Standing Unsupported with Eyes Closed Able to stand 10 seconds safely    Standing Unsupported with Feet Together Able to place feet together independently and stand 1 minute safely    From Standing, Reach Forward with Outstretched Arm Can reach confidently >25 cm (10")    From Standing Position, Pick up Object from Floor Able to pick up shoe safely and easily    From Standing Position, Turn to Look Behind Over each Shoulder Looks behind from both sides and weight shifts well    Turn 360  Degrees Able to turn 360 degrees safely but slowly    Standing Unsupported, Alternately Place Feet on Step/Stool Able to stand independently and complete 8 steps >20 seconds    Standing Unsupported, One Foot in Front Able to plae foot ahead of the other independently and hold 30 seconds    Standing on One Leg Tries to lift leg/unable to hold 3 seconds but remains standing independently    Total Score 49    Berg comment: <50% risk for falls, improved from 45/56 on 10/31/21                PATIENT EDUCATION: Education details: exercise technique Person educated: Patient Education method: Explanation, Demonstration, and Tactile cues Education comprehension: verbalized understanding, returned demonstration, verbal cues required, and tactile cues required   HOME EXERCISE PROGRAM: Continue as previously given  Added df/pf  exercise to HEP -11/02/21   PT Short Term Goals -      PT SHORT TERM GOAL #1   Title Patient will be independent in home exercise program to improve strength/mobility for better functional independence with ADLs.    Baseline 5/4: HEP given 6/20: does HEP 2x a week 7/31: 3-4x a week   Time 4   Period Weeks    Status Achieved    Target Date 01/05/22             PT Long Term Goals       PT LONG TERM GOAL #1   Title Patient will increase FOTO score to equal to or greater than 63%     to demonstrate statistically significant improvement in mobility and quality of life.    Baseline 5/4: 48%, 6/20: 60% , 7/31: 60%   Time 8   Period Weeks    Status Partially Met    Target Date 02/05/22     PT LONG TERM GOAL #2   Title Patient will increase 10 meter walk test to >1.22m/s as to improve gait speed for better community ambulation and to reduce fall risk.    Baseline 5/4: 0.54 m/s with RW, 6/20: 0.63 m/s 7/31: 0.57 with SPC   Time 8   Period Weeks    Status Partially Met    Target Date 02/05/22     PT LONG TERM GOAL #3   Title Patient will demonstrate an improved Berg Balance Score of >45/56 as to demonstrate improved balance with ADLs such as sitting/standing and transfer balance and reduced fall risk.    Baseline 5/4: 32/56, 6/20: 45/56 , 7/31: 49/56   Time 8   Period Weeks    Status Achieved;   Target Date 02/05/22     PT LONG TERM GOAL #4   Title Patient will increase BLE gross strength to 4+/5 as to improve functional strength for independent gait, increased standing tolerance and increased ADL ability.    Baseline 5/4: see note, 6/20:R knee 4/5 , 7/31: not formally assessed;    Time 8   Period Weeks    Status Partially Met    Target Date 02/05/22               Plan     Clinical Impression Statement Patient motivated and participated well within session. She was instructed in outcome measures. Patient has been using [facility] for last several weeks. She does exhibit slightly slower gait speed with cane compared to RW. She is still considered a limited home ambulator based on 10 meter walk speed. She does exhibit a significant improvement in 6  min walk test. Patient did have elevated BP after 6 min walk, which quickly recovered; will continue to monitor; She does exhibit improvement in balance based on Berg Balance assessment. She would benefit from additional skilled PT intervention to improve strength, balance and mobility; Plan to decrease frequency to 1x a week  in plan to advance HEP and transition to independent exercise. Patient agreeable.      Personal Factors and Comorbidities Comorbidity 3+;Fitness;Past/Current Experience;Time since onset of injury/illness/exacerbation;Transportation    Comorbidities hepatitis B, arthritis, diverticulitis, GERD, hyperlipidemia, hypertension    Examination-Activity Limitations Bed Mobility;Caring for [employer];Locomotion Level;Lift;Squat;Stairs;Stand;Toileting;Transfers    Examination-Participation Restrictions Cleaning;Driving;Meal Prep;Laundry;Shop;Volunteer;Yard Work    Conservation officer, historic buildings Evolving/Moderate complexity    Rehab Potential Fair    PT Frequency 1x / week    PT Duration 8 weeks    PT Treatment/Interventions ADLs/Self Care Home Management;Canalith Repostioning;Cryotherapy;Electrical Stimulation;Iontophoresis 4mg /ml Dexamethasone;Moist Heat;Traction;Ultrasound;Functional mobility training;Stair training;Gait training;DME Instruction;Therapeutic activities;Therapeutic exercise;Balance training;Neuromuscular re-education;Manual techniques;Orthotic Fit/Training;Patient/family education;Passive range of motion;Dry needling;Taping;Splinting;Energy conservation;Visual/perceptual remediation/compensation;Vestibular    PT Next Visit Plan progress stabilization and strength    PT Home Exercise Plan see above    Consulted and Agree with Plan of Care Patient              Cornell Barman, PT, DPT 12/11/21 9:35 AM   12/11/2021, 9:35 AM

## 2021-12-13 ENCOUNTER — Ambulatory Visit: Payer: Medicare Other | Admitting: Physical Therapy

## 2021-12-13 DIAGNOSIS — M25551 Pain in right hip: Secondary | ICD-10-CM | POA: Diagnosis not present

## 2021-12-15 ENCOUNTER — Ambulatory Visit: Payer: Medicare Other

## 2021-12-15 DIAGNOSIS — S76011A Strain of muscle, fascia and tendon of right hip, initial encounter: Secondary | ICD-10-CM | POA: Diagnosis not present

## 2021-12-18 ENCOUNTER — Encounter: Payer: Medicare Other | Admitting: Occupational Therapy

## 2021-12-18 ENCOUNTER — Encounter: Payer: Self-pay | Admitting: Physical Therapy

## 2021-12-18 ENCOUNTER — Ambulatory Visit: Payer: Medicare Other | Attending: Physical Medicine & Rehabilitation

## 2021-12-18 DIAGNOSIS — E7800 Pure hypercholesterolemia, unspecified: Secondary | ICD-10-CM | POA: Diagnosis not present

## 2021-12-18 DIAGNOSIS — R269 Unspecified abnormalities of gait and mobility: Secondary | ICD-10-CM | POA: Diagnosis not present

## 2021-12-18 DIAGNOSIS — I619 Nontraumatic intracerebral hemorrhage, unspecified: Secondary | ICD-10-CM | POA: Insufficient documentation

## 2021-12-18 DIAGNOSIS — M6281 Muscle weakness (generalized): Secondary | ICD-10-CM | POA: Insufficient documentation

## 2021-12-18 DIAGNOSIS — R262 Difficulty in walking, not elsewhere classified: Secondary | ICD-10-CM | POA: Diagnosis not present

## 2021-12-18 DIAGNOSIS — I779 Disorder of arteries and arterioles, unspecified: Secondary | ICD-10-CM | POA: Insufficient documentation

## 2021-12-18 DIAGNOSIS — R2681 Unsteadiness on feet: Secondary | ICD-10-CM | POA: Diagnosis not present

## 2021-12-18 DIAGNOSIS — R278 Other lack of coordination: Secondary | ICD-10-CM | POA: Diagnosis not present

## 2021-12-18 NOTE — Therapy (Signed)
OUTPATIENT PHYSICAL THERAPY TREATMENT NOTE   Patient Name: Terri Werner MRN: [id number] DOB:14-May-1951, 71 y.o., female Today's Date: 12/18/2021  PCP: Terri Mail DO  REFERRING PROVIDER: Erick Colace Werner    PT End of Session - 12/18/21 0752     Visit Number 21    Number of Visits 32    Date for PT Re-Evaluation 02/05/22    Authorization Type BCBS Medicare    Authorization Time Period 09/14/21 12/07/21    Progress Note Due on Visit 20    PT Start Time 0759    PT Stop Time 0843    PT Time Calculation (min) 44 min    Equipment Utilized During Treatment Gait belt    Activity Tolerance Patient tolerated treatment well    Behavior During Therapy Terri Werner for tasks assessed/performed                 Past Medical History:  Diagnosis Date   Arthritis    pt reports in back & knees   Colon polyps 2010   At [facility]   Diverticulitis 2016   Genital warts    GERD (gastroesophageal reflux disease)    Heart murmur    Hepatitis B    History of blood transfusion    during each major surgery per pt   History of chicken pox    History of hiatal hernia    History of torn meniscus of right knee    HTN (hypertension)    Hx of migraine headaches    Hypertension    Hypopotassemia    Past Surgical History:  Procedure Laterality Date   ABDOMINAL HYSTERECTOMY  2006   APPENDECTOMY  1980   CESAREAN SECTION  1981   COLONOSCOPY WITH PROPOFOL N/A 11/01/2014   Procedure: COLONOSCOPY WITH PROPOFOL;  Surgeon: Terri Jun, Werner;  Location: [facility] ENDOSCOPY;  Service: Endoscopy;  Laterality: N/A;   COLONOSCOPY WITH PROPOFOL N/A 01/05/2020   Procedure: COLONOSCOPY WITH PROPOFOL;  Surgeon: Terri Bill, Werner;  Location: [facility] ENDOSCOPY;  Service: Endoscopy;  Laterality: N/A;   ECTOPIC PREGNANCY SURGERY  1980   HERNIA REPAIR  2006   Umbilical   KNEE ARTHROSCOPY Left 2006   torn meniscus   TONSILLECTOMY  1971   TOTAL ABDOMINAL HYSTERECTOMY W/ BILATERAL SALPINGOOPHORECTOMY   2006   VESICOVAGINAL FISTULA CLOSURE W/ TAH     Patient Active Problem List   Diagnosis Date Noted   Hx of spontaneous intraparenchymal intracranial hemorrhage 07/12/2021   Intraparenchymal hemorrhage of brain (HCC) 05/31/2021   ICH (intracerebral hemorrhage) (HCC) 05/27/2021   Morbid obesity (HCC) 09/24/2018   Unspecified inflammatory spondylopathy, lumbar region (HCC) 03/05/2018   Lumbar spondylolysis 08/20/2017   Lumbar facet arthropathy 08/20/2017   Lumbar degenerative disc disease 08/20/2017   Chronic constipation 07/30/2017   Primary osteoarthritis involving multiple joints 04/17/2017   Postmenopausal 10/12/2016   Prediabetes 10/12/2016   Lumbago 08/04/2015   Diverticulitis of colon 07/05/2014   Carotid artery disorder (HCC) 04/27/2014   Hyperlipidemia 10/08/2013   Hypertension, benign 11/19/2009    REFERRING DIAG: R spastic hemiplegia   THERAPY DIAG:  Muscle weakness (generalized)  Unsteadiness on feet  Difficulty in walking, not elsewhere classified  Abnormality of gait and mobility  Rationale for Evaluation and Treatment Rehabilitation  PERTINENT HISTORY: Terri Werner is a 71 y.o. right-handed female with history of hepatitis B, arthritis, diverticulitis, GERD, hyperlipidemia, hypertension. Patient is recently retired. She has a daughter and niece in the area. Presented 05/27/2021 with sudden onset of right  leg weakness and mild headache. Cranial CT scan showed acute parenchymal hemorrhage in the parasagittal left frontal parietal lobes with mild edema. No substantial mass effect. CT angiogram head and neck no abnormal vascularity in the region of hemorrhage no hemodynamically significant stenosis. MRI follow-up showed no mass effect seen underlying the left posterior frontal hematoma. Patient admitted to inpatient rehab on 05/31/21 and discharged on 06/27/21. Patient used home health at home that stopped two weeks ago. Patient has R foot drop. Has a  wheelchair she does household chores in, a RW, a quad cane and R AFO  PRECAUTIONS: fall  SUBJECTIVE: Pt came into clinic today on RW. Reports having a fall one week ago falling backwards in yard. Pt reports seeing [facility] after receiving imaging. No broken bones or fractures noted per imaging. Minor pain in R hip/groin region.    PAIN:  Are you having pain? Yes. 5/10 in R hip/groin.     TODAY'S TREATMENT:  12/18/2021  There.ex: Nustep BUE/BLE level 3 x5 min with cues to keep steps per minute >/= 70 for cardiovascular challenge;    10x STS with small pad under LLE to facilitate increased weight bearing in RLE, cues for forward weight shift minimally today; 2x10 with 2 KG med ball (yellow), CGA  Standing with 3# ankle weight: -hip abduction, 2x10 reps each LE; -Hip extension  2x10 reps; no weight on RLE due to AFO preventing hip extension. Required LLE propped on green pad for RLE foot clearance.     Neuro Re-ed: Forwards/backwards 1/2 (x2) bolster step overs. 4 laps forwards leading with RLE, backwards leading with LLE. VC's for single UE support. SBA. VC's for R hip flexion with forwards steps and RLE knee flexion with hip extension to clear RLE when stepping backwards. Exercise used to assist in backwards stepping strategy due to recent fall.   R/L lateral step overs with SUE support: x2, 1/2 bolster step overs.  X6 laps in // bars with SBA  Ambulating ~20 m with hurry-cane. Min VC's for cane positioning and sequencing with RLE. Demonstrating improved, reciprocal step through gait after min VC's.    PATIENT EDUCATION: Education details: exercise technique Person educated: Patient Education method: Explanation, Demonstration, and Tactile cues Education comprehension: verbalized understanding, returned demonstration, verbal cues required, and tactile cues required   HOME EXERCISE PROGRAM: Continue as previously given  Added df/pf  exercise to HEP -11/02/21   PT Short  Term Goals -      PT SHORT TERM GOAL #1   Title Patient will be independent in home exercise program to improve strength/mobility for better functional independence with ADLs.    Baseline 5/4: HEP given 6/20: does HEP 2x a week 7/31: 3-4x a week   Time 4   Period Weeks    Status Achieved    Target Date 01/05/22             PT Long Term Goals       PT LONG TERM GOAL #1   Title Patient will increase FOTO score to equal to or greater than 63%    to demonstrate statistically significant improvement in mobility and quality of life.    Baseline 5/4: 48%, 6/20: 60% , 7/31: 60%   Time 8   Period Weeks    Status Partially Met    Target Date 02/05/22     PT LONG TERM GOAL #2   Title Patient will increase 10 meter walk test to >1.6m/s as to improve gait speed for  better community ambulation and to reduce fall risk.    Baseline 5/4: 0.54 m/s with RW, 6/20: 0.63 m/s 7/31: 0.57 with SPC   Time 8   Period Weeks    Status Partially Met    Target Date 02/05/22     PT LONG TERM GOAL #3   Title Patient will demonstrate an improved Berg Balance Score of >45/56 as to demonstrate improved balance with ADLs such as sitting/standing and transfer balance and reduced fall risk.    Baseline 5/4: 32/56, 6/20: 45/56 , 7/31: 49/56   Time 8   Period Weeks    Status Achieved;   Target Date 02/05/22     PT LONG TERM GOAL #4   Title Patient will increase BLE gross strength to 4+/5 as to improve functional strength for independent gait, increased standing tolerance and increased ADL ability.    Baseline 5/4: see note, 6/20:R knee 4/5 , 7/31: not formally assessed;    Time 8   Period Weeks    Status Partially Met    Target Date 02/05/22               Plan     Clinical Impression Statement Continuing PT POC with focus on RLE strengthening and dynamic balance exercise. Utilization of stepping strategy exercises as pt endorsing LOB posteriorly since last visit due to inability to clear RLE due  to AFO. Pt relying on at least SUE support for safe completion of foot clearance this date. Encouraged pt to utilize cane in household in familiar environment to regain confidence with use. PT to continue gait/balance with cane following session. Pt will continue to benefit from skilled PT services to progress mobility and reduce risk of falls.    Personal Factors and Comorbidities Comorbidity 3+;Fitness;Past/Current Experience;Time since onset of injury/illness/exacerbation;Transportation    Comorbidities hepatitis B, arthritis, diverticulitis, GERD, hyperlipidemia, hypertension    Examination-Activity Limitations Bed Mobility;Caring for [employer];Locomotion Level;Lift;Squat;Stairs;Stand;Toileting;Transfers    Examination-Participation Restrictions Cleaning;Driving;Meal Prep;Laundry;Shop;Volunteer;Yard Work    Conservation officer, historic buildings Evolving/Moderate complexity    Rehab Potential Fair    PT Frequency 1x / week    PT Duration 8 weeks    PT Treatment/Interventions ADLs/Self Care Home Management;Canalith Repostioning;Cryotherapy;Electrical Stimulation;Iontophoresis 4mg /ml Dexamethasone;Moist Heat;Traction;Ultrasound;Functional mobility training;Stair training;Gait training;DME Instruction;Therapeutic activities;Therapeutic exercise;Balance training;Neuromuscular re-education;Manual techniques;Orthotic Fit/Training;Patient/family education;Passive range of motion;Dry needling;Taping;Splinting;Energy conservation;Visual/perceptual remediation/compensation;Vestibular    PT Next Visit Plan progress stabilization and strength    PT Home Exercise Plan see above    Consulted and Agree with Plan of Care Patient              Delphia Grates. Fairly IV, PT, DPT Physical Therapist- [place]  [facility]  12/18/2021, 9:00 AM

## 2021-12-20 ENCOUNTER — Encounter: Payer: Medicare Other | Admitting: Occupational Therapy

## 2021-12-20 ENCOUNTER — Encounter: Payer: Self-pay | Admitting: Physical Therapy

## 2021-12-20 ENCOUNTER — Ambulatory Visit: Payer: Medicare Other

## 2021-12-20 DIAGNOSIS — R2681 Unsteadiness on feet: Secondary | ICD-10-CM | POA: Diagnosis not present

## 2021-12-20 DIAGNOSIS — R262 Difficulty in walking, not elsewhere classified: Secondary | ICD-10-CM | POA: Diagnosis not present

## 2021-12-20 DIAGNOSIS — M6281 Muscle weakness (generalized): Secondary | ICD-10-CM | POA: Diagnosis not present

## 2021-12-20 DIAGNOSIS — I619 Nontraumatic intracerebral hemorrhage, unspecified: Secondary | ICD-10-CM

## 2021-12-20 DIAGNOSIS — R269 Unspecified abnormalities of gait and mobility: Secondary | ICD-10-CM | POA: Diagnosis not present

## 2021-12-20 DIAGNOSIS — E7800 Pure hypercholesterolemia, unspecified: Secondary | ICD-10-CM | POA: Diagnosis not present

## 2021-12-20 DIAGNOSIS — I779 Disorder of arteries and arterioles, unspecified: Secondary | ICD-10-CM | POA: Diagnosis not present

## 2021-12-20 DIAGNOSIS — R278 Other lack of coordination: Secondary | ICD-10-CM | POA: Diagnosis not present

## 2021-12-20 NOTE — Therapy (Signed)
OUTPATIENT PHYSICAL THERAPY TREATMENT NOTE   Patient Name: Terri Werner MRN: [id number] DOB:Jun 20, 1950, 71 y.o., female Today's Date: 12/20/2021  PCP: Margarita Mail DO  REFERRING PROVIDER: Erick Colace MD    PT End of Session - 12/20/21 1214     Visit Number 22    Number of Visits 32    Date for PT Re-Evaluation 02/05/22    Authorization Type BCBS Medicare    Authorization Time Period 09/14/21 12/07/21    Progress Note Due on Visit 20    PT Start Time 1145    PT Stop Time 1229    PT Time Calculation (min) 44 min    Equipment Utilized During Treatment Gait belt    Activity Tolerance Patient tolerated treatment well    Behavior During Therapy WFL for tasks assessed/performed                 Past Medical History:  Diagnosis Date   Arthritis    pt reports in back & knees   Colon polyps 2010   At [facility]   Diverticulitis 2016   Genital warts    GERD (gastroesophageal reflux disease)    Heart murmur    Hepatitis B    History of blood transfusion    during each major surgery per pt   History of chicken pox    History of hiatal hernia    History of torn meniscus of right knee    HTN (hypertension)    Hx of migraine headaches    Hypertension    Hypopotassemia    Past Surgical History:  Procedure Laterality Date   ABDOMINAL HYSTERECTOMY  2006   APPENDECTOMY  1980   CESAREAN SECTION  1981   COLONOSCOPY WITH PROPOFOL N/A 11/01/2014   Procedure: COLONOSCOPY WITH PROPOFOL;  Surgeon: Scot Jun, MD;  Location: [facility] ENDOSCOPY;  Service: Endoscopy;  Laterality: N/A;   COLONOSCOPY WITH PROPOFOL N/A 01/05/2020   Procedure: COLONOSCOPY WITH PROPOFOL;  Surgeon: Regis Bill, MD;  Location: [facility] ENDOSCOPY;  Service: Endoscopy;  Laterality: N/A;   ECTOPIC PREGNANCY SURGERY  1980   HERNIA REPAIR  2006   Umbilical   KNEE ARTHROSCOPY Left 2006   torn meniscus   TONSILLECTOMY  1971   TOTAL ABDOMINAL HYSTERECTOMY W/ BILATERAL SALPINGOOPHORECTOMY   2006   VESICOVAGINAL FISTULA CLOSURE W/ TAH     Patient Active Problem List   Diagnosis Date Noted   Hx of spontaneous intraparenchymal intracranial hemorrhage 07/12/2021   Intraparenchymal hemorrhage of brain (HCC) 05/31/2021   ICH (intracerebral hemorrhage) (HCC) 05/27/2021   Morbid obesity (HCC) 09/24/2018   Unspecified inflammatory spondylopathy, lumbar region (HCC) 03/05/2018   Lumbar spondylolysis 08/20/2017   Lumbar facet arthropathy 08/20/2017   Lumbar degenerative disc disease 08/20/2017   Chronic constipation 07/30/2017   Primary osteoarthritis involving multiple joints 04/17/2017   Postmenopausal 10/12/2016   Prediabetes 10/12/2016   Lumbago 08/04/2015   Diverticulitis of colon 07/05/2014   Carotid artery disorder (HCC) 04/27/2014   Hyperlipidemia 10/08/2013   Hypertension, benign 11/19/2009    REFERRING DIAG: R spastic hemiplegia   THERAPY DIAG:  Muscle weakness (generalized)  Unsteadiness on feet  Difficulty in walking, not elsewhere classified  Abnormality of gait and mobility  Intraparenchymal hemorrhage of brain (HCC)  Rationale for Evaluation and Treatment Rehabilitation  PERTINENT HISTORY: Terri Werner is a 71 y.o. right-handed female with history of hepatitis B, arthritis, diverticulitis, GERD, hyperlipidemia, hypertension. Patient is recently retired. She has a daughter and niece in the area. Presented  05/27/2021 with sudden onset of right leg weakness and mild headache. Cranial CT scan showed acute parenchymal hemorrhage in the parasagittal left frontal parietal lobes with mild edema. No substantial mass effect. CT angiogram head and neck no abnormal vascularity in the region of hemorrhage no hemodynamically significant stenosis. MRI follow-up showed no mass effect seen underlying the left posterior frontal hematoma. Patient admitted to inpatient rehab on 05/31/21 and discharged on 06/27/21. Patient used home health at home that stopped two weeks  ago. Patient has R foot drop. Has a wheelchair she does household chores in, a RW, a quad cane and R AFO  PRECAUTIONS: fall  SUBJECTIVE: Pt reports she is overall doing well. Has been focusing on returning back to ambulating with cane since fall last week. No issues thus far.   PAIN:  Are you having pain? No     TODAY'S TREATMENT:  12/20/2021  There.ex: Nustep BUE/BLE level 4 x6 min with cues to keep steps per minute >/= 70 for cardiovascular challenge;    Neuro Re-ed:   2x160' gait bouts CGA. Min VC's for sequencing SPC in LUE with RLE. Consistent step through reciprocal 2 point gait appreciated by second bout. Minor scuffing of RLE with fatigue requiring VC's for hip flexion during swing phase.   Adjustment of cane to appropriate height. During seated rest.    Series of obstacle courses around therapy gym: x2 laps/course, CGA with PT demo prior to completion    X3 1/2 bolster step overs leading with RLE --> cone weaves (x8 cones) --> airex pad step up and down      X3 1/2 bolster step overs leading with RLE --> cone weaves (x8 cones) --> RLE on airex beam --> airex pad step up and down leading with RLE     4" step ups: x6 in // bars. PT demo and VC's for sequencing SPC and feet. Intermittent need for RUE on // bar for stability. CGA throughout. Limited eccentric control throughout.     PATIENT EDUCATION: Education details: exercise technique Person educated: Patient Education method: Explanation, Demonstration, and Tactile cues Education comprehension: verbalized understanding, returned demonstration, verbal cues required, and tactile cues required   HOME EXERCISE PROGRAM: Continue as previously given  Added df/pf  exercise to HEP -11/02/21   PT Short Term Goals -      PT SHORT TERM GOAL #1   Title Patient will be independent in home exercise program to improve strength/mobility for better functional independence with ADLs.    Baseline 5/4: HEP given 6/20: does HEP 2x  a week 7/31: 3-4x a week   Time 4   Period Weeks    Status Achieved    Target Date 01/05/22             PT Long Term Goals       PT LONG TERM GOAL #1   Title Patient will increase FOTO score to equal to or greater than 63%    to demonstrate statistically significant improvement in mobility and quality of life.    Baseline 5/4: 48%, 6/20: 60% , 7/31: 60%   Time 8   Period Weeks    Status Partially Met    Target Date 02/05/22     PT LONG TERM GOAL #2   Title Patient will increase 10 meter walk test to >1.55m/s as to improve gait speed for better community ambulation and to reduce fall risk.    Baseline 5/4: 0.54 m/s with RW, 6/20: 0.63 m/s 7/31: 0.57 with  SPC   Time 8   Period Weeks    Status Partially Met    Target Date 02/05/22     PT LONG TERM GOAL #3   Title Patient will demonstrate an improved Berg Balance Score of >45/56 as to demonstrate improved balance with ADLs such as sitting/standing and transfer balance and reduced fall risk.    Baseline 5/4: 32/56, 6/20: 45/56 , 7/31: 49/56   Time 8   Period Weeks    Status Achieved;   Target Date 02/05/22     PT LONG TERM GOAL #4   Title Patient will increase BLE gross strength to 4+/5 as to improve functional strength for independent gait, increased standing tolerance and increased ADL ability.    Baseline 5/4: see note, 6/20:R knee 4/5 , 7/31: not formally assessed;    Time 8   Period Weeks    Status Partially Met    Target Date 02/05/22               Plan     Clinical Impression Statement Focus of session on dynamic gait/balance with SPC to improve confidence and performance with household and community tasks. Pt demonstrates good understanding of SPC and LE sequencing with flat, unlevel, and curb navigation after education. No LOB throughout session but does need intermittent SPC and RUE support to maintain balance. Pt will continue to benefit from skilled PT services to progress gait/balance to reduce risk of  falls.     Personal Factors and Comorbidities Comorbidity 3+;Fitness;Past/Current Experience;Time since onset of injury/illness/exacerbation;Transportation    Comorbidities hepatitis B, arthritis, diverticulitis, GERD, hyperlipidemia, hypertension    Examination-Activity Limitations Bed Mobility;Caring for [employer];Locomotion Level;Lift;Squat;Stairs;Stand;Toileting;Transfers    Examination-Participation Restrictions Cleaning;Driving;Meal Prep;Laundry;Shop;Volunteer;Yard Work    Conservation officer, historic buildings Evolving/Moderate complexity    Rehab Potential Fair    PT Frequency 1x / week    PT Duration 8 weeks    PT Treatment/Interventions ADLs/Self Care Home Management;Canalith Repostioning;Cryotherapy;Electrical Stimulation;Iontophoresis 4mg /ml Dexamethasone;Moist Heat;Traction;Ultrasound;Functional mobility training;Stair training;Gait training;DME Instruction;Therapeutic activities;Therapeutic exercise;Balance training;Neuromuscular re-education;Manual techniques;Orthotic Fit/Training;Patient/family education;Passive range of motion;Dry needling;Taping;Splinting;Energy conservation;Visual/perceptual remediation/compensation;Vestibular    PT Next Visit Plan progress stabilization and strength    PT Home Exercise Plan see above    Consulted and Agree with Plan of Care Patient              Delphia Grates. Fairly IV, PT, DPT Physical Therapist- [place]  [facility]  12/20/2021, 12:53 PM

## 2021-12-21 ENCOUNTER — Ambulatory Visit (INDEPENDENT_AMBULATORY_CARE_PROVIDER_SITE_OTHER): Payer: Medicare Other | Admitting: Internal Medicine

## 2021-12-21 ENCOUNTER — Encounter: Payer: Self-pay | Admitting: Internal Medicine

## 2021-12-21 VITALS — BP 136/82 | HR 90 | Temp 97.8°F | Resp 16 | Ht 62.0 in | Wt 222.2 lb

## 2021-12-21 DIAGNOSIS — Z8679 Personal history of other diseases of the circulatory system: Secondary | ICD-10-CM

## 2021-12-21 DIAGNOSIS — I10 Essential (primary) hypertension: Secondary | ICD-10-CM | POA: Diagnosis not present

## 2021-12-21 DIAGNOSIS — Z0289 Encounter for other administrative examinations: Secondary | ICD-10-CM | POA: Diagnosis not present

## 2021-12-21 MED ORDER — AMLODIPINE BESYLATE 5 MG PO TABS: 5.0000 mg | ORAL_TABLET | Freq: Every day | ORAL | 1 refills | Status: DC

## 2021-12-21 MED ORDER — VALSARTAN-HYDROCHLOROTHIAZIDE 80-12.5 MG PO TABS: 1.0000 | ORAL_TABLET | Freq: Every day | ORAL | 3 refills | Status: DC

## 2021-12-21 NOTE — Progress Notes (Signed)
Established Patient Office Visit  Subjective:  Patient ID: Terri Werner, female    DOB: Apr 07, 1951  Age: 71 y.o. MRN: [id number]  CC:  Chief Complaint  Patient presents with   Follow-up   Hypertension    HPI Terri Werner presents to discuss OT and follow up. Had a fall 2 weeks ago, lost her balance and fell in her driveway.  She fell flat on her back.  She went to [facility] for evaluation x-rays were obtained, which were negative for acute fracture.  She then had an MRI of her right hip, which showed an avulsion of the right iliopsoas tendon.  She followed up with them and was seen on 12/20/2021 for continued right hip pain.  She was referred to physical therapy for 4 to 6 weeks and will follow-up in 2 months for reevaluation.  She was given tramadol 50 mg as needed for pain.  The patient states her pain is well-controlled today.  Hypertension: -Medications: Amlodipine 5 mg, Valsartan-HCTZ 80-12.5 mg daily  -Patient is compliant with above medications and reports no side effects. -Checking BP at home (average): 120-130/70-80 -Denies any SOB, CP, vision changes, LE edema or symptoms of hypotension.   HLD/HX of CVA: -Medications: Crestor discontinued after SATURN trial but on Flexeril 5 mg once daily (using mostly before PT) and Gabapentin will be discontinued, as the patient states that she is not getting any benefit from it and it just makes her feel tired. -PT and OT outpatient.  Is working on getting a left foot accelerator so she can drive and be more independent.  She does have forms to fill out today regarding her ability to drive. -Bilateral knee pain, wearing foot brace for foot drop. Pain primarily medial but also generalized. Taking Tylenol, using Voltaren gel. Xray of right knee 5/23 showing moderate arthritis with preserved joint space.  -Last lipid panel: Lipid Panel     Component Value Date/Time   CHOL 158 06/01/2021 0510   TRIG 60 06/01/2021 0510    HDL 50 06/01/2021 0510   CHOLHDL 3.2 06/01/2021 0510   VLDL 12 06/01/2021 0510   LDLCALC 96 06/01/2021 0510   LDLCALC 101 (H) 02/27/2021 1040   Chronic Constipation: -Currently taking Senna PRN -Having one BM about every 3 days, doing well no changes.  Abnormal Urinary Flow: -Following with Urology, note from 11/01/21 reviewed. Cystoscopy, cystogram and CT A/P negative for signs of fistula. Patient states she only had the gas in the urine symptoms once or twice since testing.   Health Maintenance: -Blood work up to date -Mammogram 7/23 Birads-1  -Colon cancer screening: Colonoscopy 1/23  Past Medical History:  Diagnosis Date   Arthritis    pt reports in back & knees   Colon polyps 2010   At [facility]   Diverticulitis 2016   Genital warts    GERD (gastroesophageal reflux disease)    Heart murmur    Hepatitis B    History of blood transfusion    during each major surgery per pt   History of chicken pox    History of hiatal hernia    History of torn meniscus of right knee    HTN (hypertension)    Hx of migraine headaches    Hypertension    Hypopotassemia     Past Surgical History:  Procedure Laterality Date   ABDOMINAL HYSTERECTOMY  2006   APPENDECTOMY  1980   CESAREAN SECTION  1981   COLONOSCOPY WITH PROPOFOL N/A 11/01/2014  Procedure: COLONOSCOPY WITH PROPOFOL;  Surgeon: Scot Jun, MD;  Location: [facility] ENDOSCOPY;  Service: Endoscopy;  Laterality: N/A;   COLONOSCOPY WITH PROPOFOL N/A 01/05/2020   Procedure: COLONOSCOPY WITH PROPOFOL;  Surgeon: Regis Bill, MD;  Location: [facility] ENDOSCOPY;  Service: Endoscopy;  Laterality: N/A;   ECTOPIC PREGNANCY SURGERY  1980   HERNIA REPAIR  2006   Umbilical   KNEE ARTHROSCOPY Left 2006   torn meniscus   TONSILLECTOMY  1971   TOTAL ABDOMINAL HYSTERECTOMY W/ BILATERAL SALPINGOOPHORECTOMY  2006   VESICOVAGINAL FISTULA CLOSURE W/ TAH      Family History  Problem Relation Age of Onset   Breast cancer Sister 19    Hypertension Mother    Alzheimer's disease Mother    Dementia Mother    Hypertension Maternal Grandfather    Hyperlipidemia Maternal Grandfather    Birth defects Maternal Grandmother        Bladder   Colon cancer Maternal Grandmother    Congestive Heart Failure Father    Hyperlipidemia Father    Dementia Paternal Grandmother    Dementia Paternal Grandfather    Thyroid disease Sister     Social History   Socioeconomic History   Marital status: Widowed    Spouse name: Not on file   Number of children: 2   Years of education: Not on file   Highest education level: Master's degree (e.g., MA, MS, MEng, MEd, MSW, MBA)  Occupational History   Occupation: [employer] [place]    Comment: Full Time    Employer: [employer]  Tobacco Use   Smoking status: Never   Smokeless tobacco: Never  Vaping Use   Vaping Use: Never used  Substance and Sexual Activity   Alcohol use: Yes    Comment: rare   Drug use: No   Sexual activity: Not Currently    Partners: Male  Other Topics Concern   Not on file  Social History Narrative   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      Regular Exercise -  NO   Daily Caffeine Use:  1 soda/tea          Social Determinants of Health   Financial Resource Strain: High Risk (03/05/2018)   Overall Financial Resource Strain (CARDIA)    Difficulty of Paying Living Expenses: Hard  Food Insecurity: No Food Insecurity (03/05/2018)   Hunger Vital Sign    Worried About Running Out of Food in the Last Year: Never true    Ran Out of Food in the Last Year: Never true  Transportation Needs: No Transportation Needs (03/05/2018)   PRAPARE - Administrator, Civil Service (Medical): No    Lack of Transportation (Non-Medical): No  Physical Activity: Insufficiently Active (03/05/2018)   Exercise Vital Sign    Days of Exercise  per Week: 3 days    Minutes of Exercise per Session: 20 min  Stress: No Stress Concern Present (03/05/2018)   [employer] of Occupational Health - Occupational Stress Questionnaire    Feeling of Stress : Not at all  Social Connections: Moderately Integrated (03/05/2018)   Social Connection and Isolation Panel [NHANES]    Frequency of Communication with Friends and Family: More than three times a week    Frequency of Social Gatherings with Friends and Family: Twice a week    Attends Religious Services: More than 4 times per  year    Active Member of Clubs or Organizations: Yes    Attends Banker Meetings: More than 4 times per year    Marital Status: Widowed  Intimate Partner Violence: Not At Risk (03/05/2018)   Humiliation, Afraid, Rape, and Kick questionnaire    Fear of Current or Ex-Partner: No    Emotionally Abused: No    Physically Abused: No    Sexually Abused: No    Outpatient Medications Prior to Visit  Medication Sig Dispense Refill   acetaminophen (TYLENOL) 325 MG tablet Take 2 tablets (650 mg total) by mouth every 4 (four) hours as needed for mild pain (or temp > 37.5 C (99.5 F)).     acidophilus (RISAQUAD) CAPS capsule Take 1 capsule by mouth daily. 30 capsule 0   amLODipine (NORVASC) 5 MG tablet Take 1 tablet by mouth daily.     Cholecalciferol (VITAMIN D) 50 MCG (2000 UT) tablet Take 1 tablet (2,000 Units total) by mouth daily. 90 tablet 3   cyclobenzaprine (FLEXERIL) 5 MG tablet TAKE 1 TABLET BY MOUTH DAILY AS NEEDED FOR MUSCLE SPASMS. 30 tablet 1   diclofenac Sodium (VOLTAREN) 1 % GEL Apply 4 g topically 4 (four) times daily. 400 g 0   gabapentin (NEURONTIN) 300 MG capsule Take 1 capsule (300 mg total) by mouth 3 (three) times daily. 90 capsule 3   pantoprazole (PROTONIX) 40 MG tablet      polyethylene glycol powder (GLYCOLAX/MIRALAX) 17 GM/SCOOP powder Take 17 g by mouth daily as needed for moderate constipation. 3350 g 1   senna-docusate (SENOKOT-S)  8.6-50 MG tablet Take 1 tablet by mouth 2 (two) times daily. 90 tablet 3   valsartan-hydrochlorothiazide (DIOVAN-HCT) 80-12.5 MG tablet Take 1 tablet by mouth daily. 30 tablet 3   No facility-administered medications prior to visit.    Allergies  Allergen Reactions   Shrimp [Shellfish Allergy] Swelling    Lips will burn    Latex Itching    Burning    ROS Review of Systems  Constitutional:  Negative for chills and fever.  Eyes:  Negative for visual disturbance.  Respiratory:  Negative for shortness of breath.   Cardiovascular:  Negative for chest pain and palpitations.  Genitourinary:  Negative for dysuria.  Neurological:  Negative for dizziness and headaches.      Objective:    Physical Exam Constitutional:      Appearance: Normal appearance.     Comments: Walking with walker  HENT:     Head: Normocephalic and atraumatic.  Eyes:     Conjunctiva/sclera: Conjunctivae normal.  Cardiovascular:     Rate and Rhythm: Normal rate and regular rhythm.  Pulmonary:     Effort: Pulmonary effort is normal.     Breath sounds: Normal breath sounds.  Musculoskeletal:     Right lower leg: No edema.     Left lower leg: No edema.  Skin:    General: Skin is warm and dry.  Neurological:     Mental Status: She is alert. Mental status is at baseline.  Psychiatric:        Mood and Affect: Mood normal.        Behavior: Behavior normal.    BP 136/82   Pulse 90   Temp 97.8 F (36.6 C)   Resp 16   Ht 5\' 2"  (1.575 m)   Wt 222 lb 3.2 oz (100.8 kg)   SpO2 98%   BMI 40.64 kg/m  Wt Readings from Last 3 Encounters:  12/21/21 222  lb 3.2 oz (100.8 kg)  11/23/21 227 lb 6.4 oz (103.1 kg)  11/06/21 231 lb 6.4 oz (105 kg)   Vitals:   12/21/21 1100  BP: 136/82      Health Maintenance Due  Topic Date Due   Zoster Vaccines- Shingrix (1 of 2) Never done   COVID-19 Vaccine (7 - Moderna risk series) 05/24/2020   INFLUENZA VACCINE  12/12/2021    There are no preventive care  reminders to display for this patient.  Lab Results  Component Value Date   TSH 1.80 02/27/2021   Lab Results  Component Value Date   WBC 7.0 06/06/2021   HGB 12.6 06/06/2021   HCT 38.1 06/06/2021   MCV 88.0 06/06/2021   PLT 180 06/06/2021   Lab Results  Component Value Date   NA 139 06/06/2021   K 4.2 06/06/2021   CO2 28 06/06/2021   GLUCOSE 144 (H) 06/06/2021   BUN 14 06/06/2021   CREATININE 1.10 (H) 10/25/2021   BILITOT 0.5 06/01/2021   ALKPHOS 40 06/01/2021   AST 16 06/01/2021   ALT 13 06/01/2021   PROT 6.7 06/01/2021   ALBUMIN 3.2 (L) 06/01/2021   CALCIUM 9.0 06/06/2021   ANIONGAP 6 06/06/2021   EGFR 69 02/27/2021   GFR 82.53 07/05/2014   Lab Results  Component Value Date   CHOL 158 06/01/2021   Lab Results  Component Value Date   HDL 50 06/01/2021   Lab Results  Component Value Date   LDLCALC 96 06/01/2021   Lab Results  Component Value Date   TRIG 60 06/01/2021   Lab Results  Component Value Date   CHOLHDL 3.2 06/01/2021   Lab Results  Component Value Date   HGBA1C 5.9 (A) 02/27/2021      Assessment & Plan:   1. Hypertension, benign: Stable.  Blood pressure borderline today, however doing well at home.  Continue valsartan-HCTZ 80-12.5 mg daily and amlodipine 5 mg daily.  Refills sent to pharmacy today.  Continue to monitor at home.  Follow-up here in 3 to 6 months for recheck.  - valsartan-hydrochlorothiazide (DIOVAN-HCT) 80-12.5 MG tablet; Take 1 tablet by mouth daily.  Dispense: 30 tablet; Refill: 3 - amLODipine (NORVASC) 5 MG tablet; Take 1 tablet (5 mg total) by mouth daily.  Dispense: 90 tablet; Refill: 1  2. Hx of spontaneous intraparenchymal intracranial hemorrhage/ Encounter for completion of form with patient: Overall doing better with mobility.  She is doing well with physical therapy and is now seeing them for right iliopsoas tendon tear rehabilitation as well.  Forms were filled out for her today assessing her risk to operate a  vehicle.  Will be scanned to her chart once complete.   Follow-up: Return for 3-6 months .    Margarita Mail, DO

## 2021-12-21 NOTE — Patient Instructions (Signed)
It was great seeing you today!  Plan discussed at today's visit: -Medications refilled today  Follow up in: 3-6 months   Take care and let us know if you have any questions or concerns prior to your next visit.  Dr. Rosana Berger

## 2021-12-25 ENCOUNTER — Ambulatory Visit: Payer: Medicare Other | Admitting: Physical Therapy

## 2021-12-25 ENCOUNTER — Ambulatory Visit: Payer: Medicare Other

## 2021-12-25 ENCOUNTER — Encounter: Payer: Medicare Other | Admitting: Occupational Therapy

## 2021-12-25 DIAGNOSIS — R262 Difficulty in walking, not elsewhere classified: Secondary | ICD-10-CM | POA: Diagnosis not present

## 2021-12-25 DIAGNOSIS — R269 Unspecified abnormalities of gait and mobility: Secondary | ICD-10-CM

## 2021-12-25 DIAGNOSIS — R2681 Unsteadiness on feet: Secondary | ICD-10-CM

## 2021-12-25 DIAGNOSIS — E7800 Pure hypercholesterolemia, unspecified: Secondary | ICD-10-CM

## 2021-12-25 DIAGNOSIS — M6281 Muscle weakness (generalized): Secondary | ICD-10-CM

## 2021-12-25 DIAGNOSIS — I779 Disorder of arteries and arterioles, unspecified: Secondary | ICD-10-CM | POA: Diagnosis not present

## 2021-12-25 DIAGNOSIS — R278 Other lack of coordination: Secondary | ICD-10-CM | POA: Diagnosis not present

## 2021-12-25 DIAGNOSIS — I619 Nontraumatic intracerebral hemorrhage, unspecified: Secondary | ICD-10-CM

## 2021-12-25 NOTE — Therapy (Signed)
OUTPATIENT PHYSICAL THERAPY TREATMENT NOTE   Patient Name: Terri Werner MRN: [id number] DOB:1950/07/05, 71 y.o., female Today's Date: 12/25/2021  PCP: Margarita Mail DO  REFERRING PROVIDER: Erick Colace MD    PT End of Session - 12/25/21 0855     Visit Number 23    Number of Visits 32    Date for PT Re-Evaluation 02/05/22    Authorization Type BCBS Medicare    Authorization Time Period 09/14/21 12/07/21    Progress Note Due on Visit 20    PT Start Time 0802    PT Stop Time 0842    PT Time Calculation (min) 40 min    Equipment Utilized During Treatment Gait belt    Activity Tolerance Patient tolerated treatment well    Behavior During Therapy [facility] for tasks assessed/performed                  Past Medical History:  Diagnosis Date   Arthritis    pt reports in back & knees   Colon polyps 2010   At [facility]   Diverticulitis 2016   Genital warts    GERD (gastroesophageal reflux disease)    Heart murmur    Hepatitis B    History of blood transfusion    during each major surgery per pt   History of chicken pox    History of hiatal hernia    History of torn meniscus of right knee    HTN (hypertension)    Hx of migraine headaches    Hypertension    Hypopotassemia    Past Surgical History:  Procedure Laterality Date   ABDOMINAL HYSTERECTOMY  2006   APPENDECTOMY  1980   CESAREAN SECTION  1981   COLONOSCOPY WITH PROPOFOL N/A 11/01/2014   Procedure: COLONOSCOPY WITH PROPOFOL;  Surgeon: Scot Jun, MD;  Location: [facility] ENDOSCOPY;  Service: Endoscopy;  Laterality: N/A;   COLONOSCOPY WITH PROPOFOL N/A 01/05/2020   Procedure: COLONOSCOPY WITH PROPOFOL;  Surgeon: Regis Bill, MD;  Location: [facility] ENDOSCOPY;  Service: Endoscopy;  Laterality: N/A;   ECTOPIC PREGNANCY SURGERY  1980   HERNIA REPAIR  2006   Umbilical   KNEE ARTHROSCOPY Left 2006   torn meniscus   TONSILLECTOMY  1971   TOTAL ABDOMINAL HYSTERECTOMY W/ BILATERAL  SALPINGOOPHORECTOMY  2006   VESICOVAGINAL FISTULA CLOSURE W/ TAH     Patient Active Problem List   Diagnosis Date Noted   Hx of spontaneous intraparenchymal intracranial hemorrhage 07/12/2021   Intraparenchymal hemorrhage of brain (HCC) 05/31/2021   ICH (intracerebral hemorrhage) (HCC) 05/27/2021   Morbid obesity (HCC) 09/24/2018   Unspecified inflammatory spondylopathy, lumbar region (HCC) 03/05/2018   Lumbar spondylolysis 08/20/2017   Lumbar facet arthropathy 08/20/2017   Lumbar degenerative disc disease 08/20/2017   Chronic constipation 07/30/2017   Primary osteoarthritis involving multiple joints 04/17/2017   Postmenopausal 10/12/2016   Prediabetes 10/12/2016   Lumbago 08/04/2015   Diverticulitis of colon 07/05/2014   Carotid artery disorder (HCC) 04/27/2014   Hyperlipidemia 10/08/2013   Hypertension, benign 11/19/2009    REFERRING DIAG: R spastic hemiplegia   THERAPY DIAG:  Muscle weakness (generalized)  Unsteadiness on feet  Difficulty in walking, not elsewhere classified  Abnormality of gait and mobility  Intraparenchymal hemorrhage of brain (HCC)  Pure hypercholesterolemia  Carotid artery disorder (HCC)  Rationale for Evaluation and Treatment Rehabilitation  PERTINENT HISTORY: Terri Werner is a 71 y.o. right-handed female with history of hepatitis B, arthritis, diverticulitis, GERD, hyperlipidemia, hypertension. Patient is recently retired. She  has a daughter and niece in the area. Presented 05/27/2021 with sudden onset of right leg weakness and mild headache. Cranial CT scan showed acute parenchymal hemorrhage in the parasagittal left frontal parietal lobes with mild edema. No substantial mass effect. CT angiogram head and neck no abnormal vascularity in the region of hemorrhage no hemodynamically significant stenosis. MRI follow-up showed no mass effect seen underlying the left posterior frontal hematoma. Patient admitted to inpatient rehab on 05/31/21  and discharged on 06/27/21. Patient used home health at home that stopped two weeks ago. Patient has R foot drop. Has a wheelchair she does household chores in, a RW, a quad cane and R AFO  PRECAUTIONS: fall  SUBJECTIVE: Pt reports having fall on the way into clinic today, as she was in a hurry; notes that she performed "safe fall" mechanics, rolling and using UE for support on descent. Denies pain, but endorses low back "looseness" which she reports is chronic. No significant changes since last session; endorses mild soreness in the hips and back that resolved after a few hours. States she has another botox injection for LE on 01/09/22.  PAIN:  Are you having pain? No     TODAY'S TREATMENT:  12/25/2021  There.ex: Nustep BUE/BLE level 4 x6 min with cues to keep steps per minute >/= 70 for cardiovascular challenge; seat 9, UE 10   Neuro Re-ed:   200' gait  with close SBA-CGA. Min VC's for sequencing SPC in LUE with RLE. Consistent step through reciprocal 2 point gait appreciated by second bout. Emphasis on hip/knee flexion for R foot clearance   Series of obstacle courses around therapy gym: x2 laps/course, CGA with PT demo prior to completion    x2 1/2 bolster step overs leading with RLE --> cone weaves --> airex pad step up and down (emphasis on stepping back with LLE)   x2 1/2 bolster step overs leading with LLE --> cone weaves --> airex pad step up and down (emphasis on stepping back with RLE)     Walking on airex pad in // bars, x2 laps; UUE vs. 2 finger support on // bars. Multimodal cues for exaggerated R hip/knee flexion for foot clearance.   Standing toe taps in // bars, 2x48min ea with emphasis on proprioceptive feedback in mirror. Working on Public affairs consultant, and knee/hip flexion for foot clearance   SLS with 2 finger support on // bars, 3x20s bil   Alternating tandem stance without UE support in // bars, 2x20s bil   PATIENT EDUCATION: Education details: exercise  technique, tone management techniques Person educated: Patient Education method: Explanation, Demonstration, and Tactile cues Education comprehension: verbalized understanding, returned demonstration, verbal cues required, and tactile cues required   HOME EXERCISE PROGRAM: Continue as previously given  Added df/pf  exercise to HEP -11/02/21   PT Short Term Goals -      PT SHORT TERM GOAL #1   Title Patient will be independent in home exercise program to improve strength/mobility for better functional independence with ADLs.    Baseline 5/4: HEP given 6/20: does HEP 2x a week 7/31: 3-4x a week   Time 4   Period Weeks    Status Achieved    Target Date 01/05/22             PT Long Term Goals       PT LONG TERM GOAL #1   Title Patient will increase FOTO score to equal to or greater than 63%    to demonstrate statistically  significant improvement in mobility and quality of life.    Baseline 5/4: 48%, 6/20: 60% , 7/31: 60%   Time 8   Period Weeks    Status Partially Met    Target Date 02/05/22     PT LONG TERM GOAL #2   Title Patient will increase 10 meter walk test to >1.32m/s as to improve gait speed for better community ambulation and to reduce fall risk.    Baseline 5/4: 0.54 m/s with RW, 6/20: 0.63 m/s 7/31: 0.57 with SPC   Time 8   Period Weeks    Status Partially Met    Target Date 02/05/22     PT LONG TERM GOAL #3   Title Patient will demonstrate an improved Berg Balance Score of >45/56 as to demonstrate improved balance with ADLs such as sitting/standing and transfer balance and reduced fall risk.    Baseline 5/4: 32/56, 6/20: 45/56 , 7/31: 49/56   Time 8   Period Weeks    Status Achieved;   Target Date 02/05/22     PT LONG TERM GOAL #4   Title Patient will increase BLE gross strength to 4+/5 as to improve functional strength for independent gait, increased standing tolerance and increased ADL ability.    Baseline 5/4: see note, 6/20:R knee 4/5 , 7/31: not  formally assessed;    Time 8   Period Weeks    Status Partially Met    Target Date 02/05/22               Plan     Clinical Impression Statement Pt tolerated treatment well today. Treatment focused on improving motor control, grading, and planning as well as RLE foot clearance for decreased risk of falls. Able to progress gait to close SBA for safety due to hx of multiple falls. Continues to require cueing for sequencing. Emphasis on quad control with LE stepping interventions. Educated regarding tone management skills at home, including WB through affected extremity; pt verbalized understanding. Will continue to perform interventions with R AFO donned at this time, however, pt will benefit from interventions without R AFO in future sessions when safe/appropriate. Will continue to benefit from skilled OPPT services to improve overall safety with functional mobility and return to baseline function.    Personal Factors and Comorbidities Comorbidity 3+;Fitness;Past/Current Experience;Time since onset of injury/illness/exacerbation;Transportation    Comorbidities hepatitis B, arthritis, diverticulitis, GERD, hyperlipidemia, hypertension    Examination-Activity Limitations Bed Mobility;Caring for [employer];Locomotion Level;Lift;Squat;Stairs;Stand;Toileting;Transfers    Examination-Participation Restrictions Cleaning;Driving;Meal Prep;Laundry;Shop;Volunteer;Yard Work    Conservation officer, historic buildings Evolving/Moderate complexity    Rehab Potential Fair    PT Frequency 1x / week    PT Duration 8 weeks    PT Treatment/Interventions ADLs/Self Care Home Management;Canalith Repostioning;Cryotherapy;Electrical Stimulation;Iontophoresis 4mg /ml Dexamethasone;Moist Heat;Traction;Ultrasound;Functional mobility training;Stair training;Gait training;DME Instruction;Therapeutic activities;Therapeutic exercise;Balance training;Neuromuscular re-education;Manual techniques;Orthotic  Fit/Training;Patient/family education;Passive range of motion;Dry needling;Taping;Splinting;Energy conservation;Visual/perceptual remediation/compensation;Vestibular    PT Next Visit Plan progress stabilization and strength    PT Home Exercise Plan see above    Consulted and Agree with Plan of Care Patient               Vira Blanco, PT, DPT 8:58 AM,12/25/21

## 2021-12-26 NOTE — Therapy (Signed)
OUTPATIENT PHYSICAL THERAPY TREATMENT NOTE   Patient Name: Terri Werner MRN: [id number] DOB:April 25, 1951, 71 y.o., female Today's Date: 12/27/2021  PCP: Margarita Mail DO  REFERRING PROVIDER: Erick Colace MD    PT End of Session - 12/27/21 1019     Visit Number 24    Number of Visits 32    Date for PT Re-Evaluation 02/05/22    Authorization Type BCBS Medicare    Authorization Time Period 09/14/21 12/07/21    Progress Note Due on Visit 20    PT Start Time 1015    PT Stop Time 1059    PT Time Calculation (min) 44 min    Equipment Utilized During Treatment Gait belt    Activity Tolerance Patient tolerated treatment well    Behavior During Therapy WFL for tasks assessed/performed                  Past Medical History:  Diagnosis Date   Arthritis    pt reports in back & knees   Colon polyps 2010   At [facility]   Diverticulitis 2016   Genital warts    GERD (gastroesophageal reflux disease)    Heart murmur    Hepatitis B    History of blood transfusion    during each major surgery per pt   History of chicken pox    History of hiatal hernia    History of torn meniscus of right knee    HTN (hypertension)    Hx of migraine headaches    Hypertension    Hypopotassemia    Past Surgical History:  Procedure Laterality Date   ABDOMINAL HYSTERECTOMY  2006   APPENDECTOMY  1980   CESAREAN SECTION  1981   COLONOSCOPY WITH PROPOFOL N/A 11/01/2014   Procedure: COLONOSCOPY WITH PROPOFOL;  Surgeon: Scot Jun, MD;  Location: [facility] ENDOSCOPY;  Service: Endoscopy;  Laterality: N/A;   COLONOSCOPY WITH PROPOFOL N/A 01/05/2020   Procedure: COLONOSCOPY WITH PROPOFOL;  Surgeon: Regis Bill, MD;  Location: [facility] ENDOSCOPY;  Service: Endoscopy;  Laterality: N/A;   ECTOPIC PREGNANCY SURGERY  1980   HERNIA REPAIR  2006   Umbilical   KNEE ARTHROSCOPY Left 2006   torn meniscus   TONSILLECTOMY  1971   TOTAL ABDOMINAL HYSTERECTOMY W/ BILATERAL  SALPINGOOPHORECTOMY  2006   VESICOVAGINAL FISTULA CLOSURE W/ TAH     Patient Active Problem List   Diagnosis Date Noted   Hx of spontaneous intraparenchymal intracranial hemorrhage 07/12/2021   Intraparenchymal hemorrhage of brain (HCC) 05/31/2021   ICH (intracerebral hemorrhage) (HCC) 05/27/2021   Morbid obesity (HCC) 09/24/2018   Unspecified inflammatory spondylopathy, lumbar region (HCC) 03/05/2018   Lumbar spondylolysis 08/20/2017   Lumbar facet arthropathy 08/20/2017   Lumbar degenerative disc disease 08/20/2017   Chronic constipation 07/30/2017   Primary osteoarthritis involving multiple joints 04/17/2017   Postmenopausal 10/12/2016   Prediabetes 10/12/2016   Lumbago 08/04/2015   Diverticulitis of colon 07/05/2014   Carotid artery disorder (HCC) 04/27/2014   Hyperlipidemia 10/08/2013   Hypertension, benign 11/19/2009    REFERRING DIAG: R spastic hemiplegia   THERAPY DIAG:  Muscle weakness (generalized)  Unsteadiness on feet  Difficulty in walking, not elsewhere classified  Rationale for Evaluation and Treatment Rehabilitation  PERTINENT HISTORY: Terri Werner is a 71 y.o. right-handed female with history of hepatitis B, arthritis, diverticulitis, GERD, hyperlipidemia, hypertension. Patient is recently retired. She has a daughter and niece in the area. Presented 05/27/2021 with sudden onset of right leg weakness and mild headache.  Cranial CT scan showed acute parenchymal hemorrhage in the parasagittal left frontal parietal lobes with mild edema. No substantial mass effect. CT angiogram head and neck no abnormal vascularity in the region of hemorrhage no hemodynamically significant stenosis. MRI follow-up showed no mass effect seen underlying the left posterior frontal hematoma. Patient admitted to inpatient rehab on 05/31/21 and discharged on 06/27/21. Patient used home health at home that stopped two weeks ago. Patient has R foot drop. Has a wheelchair she does  household chores in, a RW, a quad cane and R AFO  PRECAUTIONS: fall  SUBJECTIVE: Patient reports she is doing well. Has no falls since last session     PAIN:  Are you having pain? No     TODAY'S TREATMENT:   There.ex: Nustep BUE/BLE level 4 x 4 min with cues to keep steps per minute >/= 70 for cardiovascular challenge;   10x STS;  2x10 with 2 KG med ball (yellow), CGA  Standing with 3# ankle weight: -hip flexion  2x10 reps each LE; -Hip extension  2x10 reps; no weight on RLE due to AFO preventing hip extension.   Windmill back stretch 2x; each side seated variation.   Neuro Re-ed:  R/L lateral step overs half foam rollers 15x each side; finger tip support  6 cones: -weaving 2x lengths  -chevrone forward/backwards ambulation between feet x2 lengths  -lateral step toe taps 2x each LE x2 lengths   PATIENT EDUCATION: Education details: exercise technique Person educated: Patient Education method: Explanation, Demonstration, and Tactile cues Education comprehension: verbalized understanding, returned demonstration, verbal cues required, and tactile cues required   HOME EXERCISE PROGRAM: Continue as previously given  Added df/pf  exercise to HEP -11/02/21   PT Short Term Goals -      PT SHORT TERM GOAL #1   Title Patient will be independent in home exercise program to improve strength/mobility for better functional independence with ADLs.    Baseline 5/4: HEP given 6/20: does HEP 2x a week 7/31: 3-4x a week   Time 4   Period Weeks    Status Achieved    Target Date 01/05/22             PT Long Term Goals       PT LONG TERM GOAL #1   Title Patient will increase FOTO score to equal to or greater than 63%    to demonstrate statistically significant improvement in mobility and quality of life.    Baseline 5/4: 48%, 6/20: 60% , 7/31: 60%   Time 8   Period Weeks    Status Partially Met    Target Date 02/05/22     PT LONG TERM GOAL #2   Title Patient will  increase 10 meter walk test to >1.17m/s as to improve gait speed for better community ambulation and to reduce fall risk.    Baseline 5/4: 0.54 m/s with RW, 6/20: 0.63 m/s 7/31: 0.57 with SPC   Time 8   Period Weeks    Status Partially Met    Target Date 02/05/22     PT LONG TERM GOAL #3   Title Patient will demonstrate an improved Berg Balance Score of >45/56 as to demonstrate improved balance with ADLs such as sitting/standing and transfer balance and reduced fall risk.    Baseline 5/4: 32/56, 6/20: 45/56 , 7/31: 49/56   Time 8   Period Weeks    Status Achieved;   Target Date 02/05/22     PT LONG TERM GOAL #  4   Title Patient will increase BLE gross strength to 4+/5 as to improve functional strength for independent gait, increased standing tolerance and increased ADL ability.    Baseline 5/4: see note, 6/20:R knee 4/5 , 7/31: not formally assessed;    Time 8   Period Weeks    Status Partially Met    Target Date 02/05/22               Plan     Clinical Impression Statement Patient presents with excellent motivation. She is able to tolerate progressive stability interventions but continues to be limited by single limb stance and spatial awareness. . Pt will continue to benefit from skilled PT services to progress mobility and reduce risk of falls.    Personal Factors and Comorbidities Comorbidity 3+;Fitness;Past/Current Experience;Time since onset of injury/illness/exacerbation;Transportation    Comorbidities hepatitis B, arthritis, diverticulitis, GERD, hyperlipidemia, hypertension    Examination-Activity Limitations Bed Mobility;Caring for [employer];Locomotion Level;Lift;Squat;Stairs;Stand;Toileting;Transfers    Examination-Participation Restrictions Cleaning;Driving;Meal Prep;Laundry;Shop;Volunteer;Yard Work    Conservation officer, historic buildings Evolving/Moderate complexity    Rehab Potential Fair    PT Frequency 1x / week    PT Duration 8 weeks     PT Treatment/Interventions ADLs/Self Care Home Management;Canalith Repostioning;Cryotherapy;Electrical Stimulation;Iontophoresis 4mg /ml Dexamethasone;Moist Heat;Traction;Ultrasound;Functional mobility training;Stair training;Gait training;DME Instruction;Therapeutic activities;Therapeutic exercise;Balance training;Neuromuscular re-education;Manual techniques;Orthotic Fit/Training;Patient/family education;Passive range of motion;Dry needling;Taping;Splinting;Energy conservation;Visual/perceptual remediation/compensation;Vestibular    PT Next Visit Plan progress stabilization and strength    PT Home Exercise Plan see above    Consulted and Agree with Plan of Care Patient             Precious Bard PT  Physical Therapist- [facility]  12/27/2021, 12:19 PM

## 2021-12-27 ENCOUNTER — Ambulatory Visit: Payer: Medicare Other

## 2021-12-27 ENCOUNTER — Encounter: Payer: Medicare Other | Admitting: Occupational Therapy

## 2021-12-27 ENCOUNTER — Ambulatory Visit: Payer: Medicare Other | Admitting: Physical Therapy

## 2021-12-27 DIAGNOSIS — R278 Other lack of coordination: Secondary | ICD-10-CM | POA: Diagnosis not present

## 2021-12-27 DIAGNOSIS — R2681 Unsteadiness on feet: Secondary | ICD-10-CM

## 2021-12-27 DIAGNOSIS — E7800 Pure hypercholesterolemia, unspecified: Secondary | ICD-10-CM | POA: Diagnosis not present

## 2021-12-27 DIAGNOSIS — R262 Difficulty in walking, not elsewhere classified: Secondary | ICD-10-CM | POA: Diagnosis not present

## 2021-12-27 DIAGNOSIS — R269 Unspecified abnormalities of gait and mobility: Secondary | ICD-10-CM | POA: Diagnosis not present

## 2021-12-27 DIAGNOSIS — I779 Disorder of arteries and arterioles, unspecified: Secondary | ICD-10-CM | POA: Diagnosis not present

## 2021-12-27 DIAGNOSIS — M6281 Muscle weakness (generalized): Secondary | ICD-10-CM

## 2021-12-27 DIAGNOSIS — I619 Nontraumatic intracerebral hemorrhage, unspecified: Secondary | ICD-10-CM | POA: Diagnosis not present

## 2022-01-01 ENCOUNTER — Encounter: Payer: Medicare Other | Admitting: Occupational Therapy

## 2022-01-01 ENCOUNTER — Ambulatory Visit: Payer: Medicare Other | Admitting: Physical Therapy

## 2022-01-01 ENCOUNTER — Ambulatory Visit: Payer: Medicare Other

## 2022-01-01 DIAGNOSIS — R269 Unspecified abnormalities of gait and mobility: Secondary | ICD-10-CM | POA: Diagnosis not present

## 2022-01-01 DIAGNOSIS — R278 Other lack of coordination: Secondary | ICD-10-CM | POA: Diagnosis not present

## 2022-01-01 DIAGNOSIS — M6281 Muscle weakness (generalized): Secondary | ICD-10-CM | POA: Diagnosis not present

## 2022-01-01 DIAGNOSIS — I619 Nontraumatic intracerebral hemorrhage, unspecified: Secondary | ICD-10-CM | POA: Diagnosis not present

## 2022-01-01 DIAGNOSIS — R262 Difficulty in walking, not elsewhere classified: Secondary | ICD-10-CM | POA: Diagnosis not present

## 2022-01-01 DIAGNOSIS — R2681 Unsteadiness on feet: Secondary | ICD-10-CM | POA: Diagnosis not present

## 2022-01-01 DIAGNOSIS — I779 Disorder of arteries and arterioles, unspecified: Secondary | ICD-10-CM | POA: Diagnosis not present

## 2022-01-01 DIAGNOSIS — E7800 Pure hypercholesterolemia, unspecified: Secondary | ICD-10-CM | POA: Diagnosis not present

## 2022-01-01 NOTE — Therapy (Signed)
OUTPATIENT PHYSICAL THERAPY TREATMENT NOTE   Patient Name: Terri Werner MRN: [id number] DOB:Jul 30, 1950, 71 y.o., female Today's Date: 01/01/2022  PCP: Margarita Mail DO  REFERRING PROVIDER: Erick Colace MD    PT End of Session - 01/01/22 1314     Visit Number 25    Number of Visits 32    Date for PT Re-Evaluation 02/05/22    Authorization Type BCBS Medicare    Authorization Time Period 09/14/21 12/07/21    Progress Note Due on Visit 20    PT Start Time 1019    PT Stop Time 1100    PT Time Calculation (min) 41 min    Equipment Utilized During Treatment Gait belt    Activity Tolerance Patient tolerated treatment well    Behavior During Therapy WFL for tasks assessed/performed                   Past Medical History:  Diagnosis Date   Arthritis    pt reports in back & knees   Colon polyps 2010   At [facility]   Diverticulitis 2016   Genital warts    GERD (gastroesophageal reflux disease)    Heart murmur    Hepatitis B    History of blood transfusion    during each major surgery per pt   History of chicken pox    History of hiatal hernia    History of torn meniscus of right knee    HTN (hypertension)    Hx of migraine headaches    Hypertension    Hypopotassemia    Past Surgical History:  Procedure Laterality Date   ABDOMINAL HYSTERECTOMY  2006   APPENDECTOMY  1980   CESAREAN SECTION  1981   COLONOSCOPY WITH PROPOFOL N/A 11/01/2014   Procedure: COLONOSCOPY WITH PROPOFOL;  Surgeon: Scot Jun, MD;  Location: [facility] ENDOSCOPY;  Service: Endoscopy;  Laterality: N/A;   COLONOSCOPY WITH PROPOFOL N/A 01/05/2020   Procedure: COLONOSCOPY WITH PROPOFOL;  Surgeon: Regis Bill, MD;  Location: [facility] ENDOSCOPY;  Service: Endoscopy;  Laterality: N/A;   ECTOPIC PREGNANCY SURGERY  1980   HERNIA REPAIR  2006   Umbilical   KNEE ARTHROSCOPY Left 2006   torn meniscus   TONSILLECTOMY  1971   TOTAL ABDOMINAL HYSTERECTOMY W/ BILATERAL  SALPINGOOPHORECTOMY  2006   VESICOVAGINAL FISTULA CLOSURE W/ TAH     Patient Active Problem List   Diagnosis Date Noted   Hx of spontaneous intraparenchymal intracranial hemorrhage 07/12/2021   Intraparenchymal hemorrhage of brain (HCC) 05/31/2021   ICH (intracerebral hemorrhage) (HCC) 05/27/2021   Morbid obesity (HCC) 09/24/2018   Unspecified inflammatory spondylopathy, lumbar region (HCC) 03/05/2018   Lumbar spondylolysis 08/20/2017   Lumbar facet arthropathy 08/20/2017   Lumbar degenerative disc disease 08/20/2017   Chronic constipation 07/30/2017   Primary osteoarthritis involving multiple joints 04/17/2017   Postmenopausal 10/12/2016   Prediabetes 10/12/2016   Lumbago 08/04/2015   Diverticulitis of colon 07/05/2014   Carotid artery disorder (HCC) 04/27/2014   Hyperlipidemia 10/08/2013   Hypertension, benign 11/19/2009    REFERRING DIAG: R spastic hemiplegia   THERAPY DIAG:  Muscle weakness (generalized)  Other lack of coordination  Difficulty in walking, not elsewhere classified  Rationale for Evaluation and Treatment Rehabilitation  PERTINENT HISTORY: Terri Werner is a 71 y.o. right-handed female with history of hepatitis B, arthritis, diverticulitis, GERD, hyperlipidemia, hypertension. Patient is recently retired. She has a daughter and niece in the area. Presented 05/27/2021 with sudden onset of right leg weakness and  mild headache. Cranial CT scan showed acute parenchymal hemorrhage in the parasagittal left frontal parietal lobes with mild edema. No substantial mass effect. CT angiogram head and neck no abnormal vascularity in the region of hemorrhage no hemodynamically significant stenosis. MRI follow-up showed no mass effect seen underlying the left posterior frontal hematoma. Patient admitted to inpatient rehab on 05/31/21 and discharged on 06/27/21. Patient used home health at home that stopped two weeks ago. Patient has R foot drop. Has a wheelchair she does  household chores in, a RW, a quad cane and R AFO  PRECAUTIONS: fall  SUBJECTIVE: Pt reports no recent falls. Pt wants to "get rid" of her AFO. She reports she feels it is causing "all her falls." She was ambulating without it this past weekend, didn't use it when going out or to church. Pt reports she feels steadier without her AFO. With it on she says she has tendency to lose balance posteriorly.Marland Kitchen   PAIN:  Are you having pain? No     TODAY'S TREATMENT:   There.ex:  Inspection of R AFO as pt reports multiple instances of feeling like she is rocking back with posterior LOB. R ankle in PF and adduction with AFO removed.  -3 laps without R AFO with RW (approx 80 ft/lap) able to consistently heel strike, however, toe catch with toe-off observed intermittently and at times pt exhibits excessive supination. -Performs 1 lap with  AFO donned, exhibits improved toe off and eliminates toe catch, but still with increased supination. No unsteadiness observed throughout.  Recommended discussing with her physician referral to an orthotist for AFO adjustments/AFO specific to her concerns. Pt verbalizes understanding.    Seated DF with PT providing assist to R ankle  2x10 RLE Supine on plinth 2x20 B DF (PT assists R ankle intermittently) - minimal AROM noted without assist.   10x STS  Staggered STS 6x each LE  Standing with 4# ankle weight B: -hip flexion  2x10 reps each LE; -Hip extension  2x10 reps; no weight on RLE due to AFO preventing hip extension.   Standing heel raises 3x10 B     PATIENT EDUCATION: Education details: exercise technique, body mechanics, recommendations for seeing orthotist Person educated: Patient Education method: Explanation, Demonstration, and Tactile cues Education comprehension: verbalized understanding, returned demonstration, verbal cues required, and tactile cues required   HOME EXERCISE PROGRAM: Continue as previously given  Added df/pf  exercise to  HEP -11/02/21   PT Short Term Goals -      PT SHORT TERM GOAL #1   Title Patient will be independent in home exercise program to improve strength/mobility for better functional independence with ADLs.    Baseline 5/4: HEP given 6/20: does HEP 2x a week 7/31: 3-4x a week   Time 4   Period Weeks    Status Achieved    Target Date 01/05/22             PT Long Term Goals       PT LONG TERM GOAL #1   Title Patient will increase FOTO score to equal to or greater than 63%    to demonstrate statistically significant improvement in mobility and quality of life.    Baseline 5/4: 48%, 6/20: 60% , 7/31: 60%   Time 8   Period Weeks    Status Partially Met    Target Date 02/05/22     PT LONG TERM GOAL #2   Title Patient will increase 10 meter walk test to >1.46m/s as  to improve gait speed for better community ambulation and to reduce fall risk.    Baseline 5/4: 0.54 m/s with RW, 6/20: 0.63 m/s 7/31: 0.57 with SPC   Time 8   Period Weeks    Status Partially Met    Target Date 02/05/22     PT LONG TERM GOAL #3   Title Patient will demonstrate an improved Berg Balance Score of >45/56 as to demonstrate improved balance with ADLs such as sitting/standing and transfer balance and reduced fall risk.    Baseline 5/4: 32/56, 6/20: 45/56 , 7/31: 49/56   Time 8   Period Weeks    Status Achieved;   Target Date 02/05/22     PT LONG TERM GOAL #4   Title Patient will increase BLE gross strength to 4+/5 as to improve functional strength for independent gait, increased standing tolerance and increased ADL ability.    Baseline 5/4: see note, 6/20:R knee 4/5 , 7/31: not formally assessed;    Time 8   Period Weeks    Status Partially Met    Target Date 02/05/22               Plan     Clinical Impression Statement Patient continues to present with excellent motivation to participate in session. She expresses concerns today about posterior LOB with current AFO. Pt ambulated with and without  AFO within session and no unsteadiness observed. However, may benefit from testing this again with pt using a cane. Pt did exhibit heel strike with and without the AFO. With the AFO she had improved toe-off (no toe drag), but did exhibit supination. Supination was present without her AFO as well. The pt will continue to benefit from skilled PT services to progress mobility and reduce risk of falls.    Personal Factors and Comorbidities Comorbidity 3+;Fitness;Past/Current Experience;Time since onset of injury/illness/exacerbation;Transportation    Comorbidities hepatitis B, arthritis, diverticulitis, GERD, hyperlipidemia, hypertension    Examination-Activity Limitations Bed Mobility;Caring for [employer];Locomotion Level;Lift;Squat;Stairs;Stand;Toileting;Transfers    Examination-Participation Restrictions Cleaning;Driving;Meal Prep;Laundry;Shop;Volunteer;Yard Work    Conservation officer, historic buildings Evolving/Moderate complexity    Rehab Potential Fair    PT Frequency 1x / week    PT Duration 8 weeks    PT Treatment/Interventions ADLs/Self Care Home Management;Canalith Repostioning;Cryotherapy;Electrical Stimulation;Iontophoresis 4mg /ml Dexamethasone;Moist Heat;Traction;Ultrasound;Functional mobility training;Stair training;Gait training;DME Instruction;Therapeutic activities;Therapeutic exercise;Balance training;Neuromuscular re-education;Manual techniques;Orthotic Fit/Training;Patient/family education;Passive range of motion;Dry needling;Taping;Splinting;Energy conservation;Visual/perceptual remediation/compensation;Vestibular    PT Next Visit Plan progress stabilization and strength    PT Home Exercise Plan see above    Consulted and Agree with Plan of Care Patient             Baird Kay PT  Physical Therapist- [facility]  01/01/2022, 1:23 PM

## 2022-01-03 ENCOUNTER — Ambulatory Visit: Payer: Medicare Other

## 2022-01-03 ENCOUNTER — Ambulatory Visit: Payer: Medicare Other | Admitting: Physical Therapy

## 2022-01-03 ENCOUNTER — Encounter: Payer: Medicare Other | Admitting: Occupational Therapy

## 2022-01-03 DIAGNOSIS — R262 Difficulty in walking, not elsewhere classified: Secondary | ICD-10-CM | POA: Diagnosis not present

## 2022-01-03 DIAGNOSIS — I779 Disorder of arteries and arterioles, unspecified: Secondary | ICD-10-CM | POA: Diagnosis not present

## 2022-01-03 DIAGNOSIS — R2681 Unsteadiness on feet: Secondary | ICD-10-CM

## 2022-01-03 DIAGNOSIS — E7800 Pure hypercholesterolemia, unspecified: Secondary | ICD-10-CM | POA: Diagnosis not present

## 2022-01-03 DIAGNOSIS — M6281 Muscle weakness (generalized): Secondary | ICD-10-CM | POA: Diagnosis not present

## 2022-01-03 DIAGNOSIS — R278 Other lack of coordination: Secondary | ICD-10-CM

## 2022-01-03 DIAGNOSIS — I619 Nontraumatic intracerebral hemorrhage, unspecified: Secondary | ICD-10-CM | POA: Diagnosis not present

## 2022-01-03 DIAGNOSIS — R269 Unspecified abnormalities of gait and mobility: Secondary | ICD-10-CM | POA: Diagnosis not present

## 2022-01-03 NOTE — Therapy (Signed)
OUTPATIENT PHYSICAL THERAPY TREATMENT NOTE   Patient Name: Terri Werner MRN: [id number] DOB:06/25/1950, 71 y.o., female Today's Date: 01/03/2022  PCP: Margarita Mail DO  REFERRING PROVIDER: Erick Colace MD    PT End of Session - 01/03/22 1014     Visit Number 26    Number of Visits 32    Date for PT Re-Evaluation 02/05/22    Authorization Type BCBS Medicare    Authorization Time Period 09/14/21 12/07/21    Progress Note Due on Visit 20    PT Start Time 1016    PT Stop Time 1058    PT Time Calculation (min) 42 min    Equipment Utilized During Treatment Gait belt    Activity Tolerance Patient tolerated treatment well    Behavior During Therapy WFL for tasks assessed/performed                    Past Medical History:  Diagnosis Date   Arthritis    pt reports in back & knees   Colon polyps 2010   At [facility]   Diverticulitis 2016   Genital warts    GERD (gastroesophageal reflux disease)    Heart murmur    Hepatitis B    History of blood transfusion    during each major surgery per pt   History of chicken pox    History of hiatal hernia    History of torn meniscus of right knee    HTN (hypertension)    Hx of migraine headaches    Hypertension    Hypopotassemia    Past Surgical History:  Procedure Laterality Date   ABDOMINAL HYSTERECTOMY  2006   APPENDECTOMY  1980   CESAREAN SECTION  1981   COLONOSCOPY WITH PROPOFOL N/A 11/01/2014   Procedure: COLONOSCOPY WITH PROPOFOL;  Surgeon: Scot Jun, MD;  Location: [facility] ENDOSCOPY;  Service: Endoscopy;  Laterality: N/A;   COLONOSCOPY WITH PROPOFOL N/A 01/05/2020   Procedure: COLONOSCOPY WITH PROPOFOL;  Surgeon: Regis Bill, MD;  Location: [facility] ENDOSCOPY;  Service: Endoscopy;  Laterality: N/A;   ECTOPIC PREGNANCY SURGERY  1980   HERNIA REPAIR  2006   Umbilical   KNEE ARTHROSCOPY Left 2006   torn meniscus   TONSILLECTOMY  1971   TOTAL ABDOMINAL HYSTERECTOMY W/ BILATERAL  SALPINGOOPHORECTOMY  2006   VESICOVAGINAL FISTULA CLOSURE W/ TAH     Patient Active Problem List   Diagnosis Date Noted   Hx of spontaneous intraparenchymal intracranial hemorrhage 07/12/2021   Intraparenchymal hemorrhage of brain (HCC) 05/31/2021   ICH (intracerebral hemorrhage) (HCC) 05/27/2021   Morbid obesity (HCC) 09/24/2018   Unspecified inflammatory spondylopathy, lumbar region (HCC) 03/05/2018   Lumbar spondylolysis 08/20/2017   Lumbar facet arthropathy 08/20/2017   Lumbar degenerative disc disease 08/20/2017   Chronic constipation 07/30/2017   Primary osteoarthritis involving multiple joints 04/17/2017   Postmenopausal 10/12/2016   Prediabetes 10/12/2016   Lumbago 08/04/2015   Diverticulitis of colon 07/05/2014   Carotid artery disorder (HCC) 04/27/2014   Hyperlipidemia 10/08/2013   Hypertension, benign 11/19/2009    REFERRING DIAG: R spastic hemiplegia   THERAPY DIAG:  Other lack of coordination  Muscle weakness (generalized)  Unsteadiness on feet  Rationale for Evaluation and Treatment Rehabilitation  PERTINENT HISTORY: Terri Werner is a 71 y.o. right-handed female with history of hepatitis B, arthritis, diverticulitis, GERD, hyperlipidemia, hypertension. Patient is recently retired. She has a daughter and niece in the area. Presented 05/27/2021 with sudden onset of right leg weakness and mild headache.  Cranial CT scan showed acute parenchymal hemorrhage in the parasagittal left frontal parietal lobes with mild edema. No substantial mass effect. CT angiogram head and neck no abnormal vascularity in the region of hemorrhage no hemodynamically significant stenosis. MRI follow-up showed no mass effect seen underlying the left posterior frontal hematoma. Patient admitted to inpatient rehab on 05/31/21 and discharged on 06/27/21. Patient used home health at home that stopped two weeks ago. Patient has R foot drop. Has a wheelchair she does household chores in, a RW,  a quad cane and R AFO  PRECAUTIONS: fall  SUBJECTIVE: Pt reports 3-4/10 low back pain (reports this is chronic, constant).  Pt presents using a RW as she walked in today by herself. Reports she feels she lost her confidence in her balance since fall back in July.   PAIN:  Are you having pain? 3-4/10, LBP     TODAY'S TREATMENT:   There.ex:  PT did observe consistent toe-drag of RLE with AFO donned as pt walked in using RW.  Instructed pt through seated RLE gastrocsol stretch 60 sec RLE  PT provides R gastrcsol stretch 4x15-20 sec bouts. Observed improvement with reps  AAROM DF of R ankle while pt performs AROM DF L ankle 4x20. Improved activation palpated and observed with reps.  AAROM R ankle eversion while pt simultaneously performs L ankle EV 3x20  Amb with RW without AFO donned 1x around clinic, cuing for sequencing: heel-strike, toe-off. Limited ability to correct toe-catch.  Seated heel raises 2x20 B with mirror cue  Standing heel raises 2x10 B  Standing with 5# ankle weight B: -hip flexion 1x10 reps each LE; -Hip extension 1x10 reps each LE  PT still recommends that pt use AFO but would benefit from seeing orthotist for adjustment or new AFO to assist with preventing toe drag/catch.  Neuro Re-ed:   R/L lateral step overs orange hurdle 15x each side; finger tip support   Weaving through cones and stepping over green pad and orange hurdle, with RW, CGA, AFO donned 4x through. Difficulty with foot clearance initially. Improves obstacle clearance with reps   PATIENT EDUCATION: Education details: exercise technique, body mechanics Person educated: Patient Education method: Explanation, Demonstration, and Tactile cues Education comprehension: verbalized understanding, returned demonstration, verbal cues required, and tactile cues required   HOME EXERCISE PROGRAM: Continue as previously given  Added df/pf  exercise to HEP -11/02/21   PT Short Term Goals -       PT SHORT TERM GOAL #1   Title Patient will be independent in home exercise program to improve strength/mobility for better functional independence with ADLs.    Baseline 5/4: HEP given 6/20: does HEP 2x a week 7/31: 3-4x a week   Time 4   Period Weeks    Status Achieved    Target Date 01/05/22             PT Long Term Goals       PT LONG TERM GOAL #1   Title Patient will increase FOTO score to equal to or greater than 63%    to demonstrate statistically significant improvement in mobility and quality of life.    Baseline 5/4: 48%, 6/20: 60% , 7/31: 60%   Time 8   Period Weeks    Status Partially Met    Target Date 02/05/22     PT LONG TERM GOAL #2   Title Patient will increase 10 meter walk test to >1.31m/s as to improve gait speed for better community ambulation  and to reduce fall risk.    Baseline 5/4: 0.54 m/s with RW, 6/20: 0.63 m/s 7/31: 0.57 with SPC   Time 8   Period Weeks    Status Partially Met    Target Date 02/05/22     PT LONG TERM GOAL #3   Title Patient will demonstrate an improved Berg Balance Score of >45/56 as to demonstrate improved balance with ADLs such as sitting/standing and transfer balance and reduced fall risk.    Baseline 5/4: 32/56, 6/20: 45/56 , 7/31: 49/56   Time 8   Period Weeks    Status Achieved;   Target Date 02/05/22     PT LONG TERM GOAL #4   Title Patient will increase BLE gross strength to 4+/5 as to improve functional strength for independent gait, increased standing tolerance and increased ADL ability.    Baseline 5/4: see note, 6/20:R knee 4/5 , 7/31: not formally assessed;    Time 8   Period Weeks    Status Partially Met    Target Date 02/05/22               Plan     Clinical Impression Statement PT continues to recommend to pt use of AFO but that she would benefit from seeing an orthotist due to consistent toe drag with AFO donned. Pt demonstrates within-session improvement in DF of R ankle following multiple reps  with PT assistance, however, activation still limited. PT did instruct pt in gastroc stretch to try to improve tissue extensibility to improve ease with mobility of ankle. The pt will continue to benefit from skilled PT services to progress mobility and reduce risk of falls.    Personal Factors and Comorbidities Comorbidity 3+;Fitness;Past/Current Experience;Time since onset of injury/illness/exacerbation;Transportation    Comorbidities hepatitis B, arthritis, diverticulitis, GERD, hyperlipidemia, hypertension    Examination-Activity Limitations Bed Mobility;Caring for [employer];Locomotion Level;Lift;Squat;Stairs;Stand;Toileting;Transfers    Examination-Participation Restrictions Cleaning;Driving;Meal Prep;Laundry;Shop;Volunteer;Yard Work    Conservation officer, historic buildings Evolving/Moderate complexity    Rehab Potential Fair    PT Frequency 1x / week    PT Duration 8 weeks    PT Treatment/Interventions ADLs/Self Care Home Management;Canalith Repostioning;Cryotherapy;Electrical Stimulation;Iontophoresis 4mg /ml Dexamethasone;Moist Heat;Traction;Ultrasound;Functional mobility training;Stair training;Gait training;DME Instruction;Therapeutic activities;Therapeutic exercise;Balance training;Neuromuscular re-education;Manual techniques;Orthotic Fit/Training;Patient/family education;Passive range of motion;Dry needling;Taping;Splinting;Energy conservation;Visual/perceptual remediation/compensation;Vestibular    PT Next Visit Plan progress stabilization and strength    PT Home Exercise Plan see above    Consulted and Agree with Plan of Care Patient             Baird Kay PT  Physical Therapist- [facility]  01/03/2022, 4:29 PM

## 2022-01-08 ENCOUNTER — Ambulatory Visit: Payer: Medicare Other | Admitting: Physical Therapy

## 2022-01-08 NOTE — Therapy (Signed)
OUTPATIENT PHYSICAL THERAPY TREATMENT NOTE   Patient Name: Terri Werner MRN: [id number] DOB:Mar 02, 1951, 71 y.o., female Today's Date: 01/09/2022  PCP: Margarita Mail DO  REFERRING PROVIDER: Erick Colace MD    PT End of Session - 01/09/22 0845     Visit Number 27    Number of Visits 32    Date for PT Re-Evaluation 02/05/22    Authorization Type BCBS Medicare    Authorization Time Period 09/14/21 12/07/21    Progress Note Due on Visit 20    PT Start Time 0845    PT Stop Time 0929    PT Time Calculation (min) 44 min    Equipment Utilized During Treatment Gait belt    Activity Tolerance Patient tolerated treatment well    Behavior During Therapy [facility] for tasks assessed/performed                     Past Medical History:  Diagnosis Date   Arthritis    pt reports in back & knees   Colon polyps 2010   At [facility]   Diverticulitis 2016   Genital warts    GERD (gastroesophageal reflux disease)    Heart murmur    Hepatitis B    History of blood transfusion    during each major surgery per pt   History of chicken pox    History of hiatal hernia    History of torn meniscus of right knee    HTN (hypertension)    Hx of migraine headaches    Hypertension    Hypopotassemia    Past Surgical History:  Procedure Laterality Date   ABDOMINAL HYSTERECTOMY  2006   APPENDECTOMY  1980   CESAREAN SECTION  1981   COLONOSCOPY WITH PROPOFOL N/A 11/01/2014   Procedure: COLONOSCOPY WITH PROPOFOL;  Surgeon: Scot Jun, MD;  Location: [facility] ENDOSCOPY;  Service: Endoscopy;  Laterality: N/A;   COLONOSCOPY WITH PROPOFOL N/A 01/05/2020   Procedure: COLONOSCOPY WITH PROPOFOL;  Surgeon: Regis Bill, MD;  Location: [facility] ENDOSCOPY;  Service: Endoscopy;  Laterality: N/A;   ECTOPIC PREGNANCY SURGERY  1980   HERNIA REPAIR  2006   Umbilical   KNEE ARTHROSCOPY Left 2006   torn meniscus   TONSILLECTOMY  1971   TOTAL ABDOMINAL HYSTERECTOMY W/ BILATERAL  SALPINGOOPHORECTOMY  2006   VESICOVAGINAL FISTULA CLOSURE W/ TAH     Patient Active Problem List   Diagnosis Date Noted   Hx of spontaneous intraparenchymal intracranial hemorrhage 07/12/2021   Intraparenchymal hemorrhage of brain (HCC) 05/31/2021   ICH (intracerebral hemorrhage) (HCC) 05/27/2021   Morbid obesity (HCC) 09/24/2018   Unspecified inflammatory spondylopathy, lumbar region (HCC) 03/05/2018   Lumbar spondylolysis 08/20/2017   Lumbar facet arthropathy 08/20/2017   Lumbar degenerative disc disease 08/20/2017   Chronic constipation 07/30/2017   Primary osteoarthritis involving multiple joints 04/17/2017   Postmenopausal 10/12/2016   Prediabetes 10/12/2016   Lumbago 08/04/2015   Diverticulitis of colon 07/05/2014   Carotid artery disorder (HCC) 04/27/2014   Hyperlipidemia 10/08/2013   Hypertension, benign 11/19/2009    REFERRING DIAG: R spastic hemiplegia   THERAPY DIAG:  Other lack of coordination  Muscle weakness (generalized)  Unsteadiness on feet  Rationale for Evaluation and Treatment Rehabilitation  PERTINENT HISTORY: Terri Werner is a 71 y.o. right-handed female with history of hepatitis B, arthritis, diverticulitis, GERD, hyperlipidemia, hypertension. Patient is recently retired. She has a daughter and niece in the area. Presented 05/27/2021 with sudden onset of right leg weakness and mild  headache. Cranial CT scan showed acute parenchymal hemorrhage in the parasagittal left frontal parietal lobes with mild edema. No substantial mass effect. CT angiogram head and neck no abnormal vascularity in the region of hemorrhage no hemodynamically significant stenosis. MRI follow-up showed no mass effect seen underlying the left posterior frontal hematoma. Patient admitted to inpatient rehab on 05/31/21 and discharged on 06/27/21. Patient used home health at home that stopped two weeks ago. Patient has R foot drop. Has a wheelchair she does household chores in, a RW,  a quad cane and R AFO  PRECAUTIONS: fall  SUBJECTIVE: Patient has been practicing walking without her AFO. Is getting her Botox later today.   PAIN:  Are you having pain? 3-4/10, LBP     TODAY'S TREATMENT:   There.ex:   6" step : -toe taps 10x each LE limiting UE support    Seated: -lateral step over hedgehog 15x each LE 6" step toe taps 30 seconds x 2 trials Large swiss ball forward rollout 12x    Seated with # 5 ankle weights  -Seated marches with upright posture, back away from back of chair for abdominal/trunk activation/stabilization, 10x each LE -Seated LAQ with 3 second holds, 10x each LE, cueing for muscle activation and sequencing for neutral alignment -Seated IR/ER with cueing for stabilizing knee placement with lateral foot movement for optimal muscle recruitment, 10x each LE -heel raise 15x  PT still recommends that pt use AFO but would benefit from seeing orthotist for adjustment or new AFO to assist with preventing toe drag/catch.  Neuro Re-ed: Standing with CGA next to support surface: (No AFO on)  Airex pad: static stand 30 seconds x 2 trials, noticeable trembling of ankles/LE's with fatigue and challenge to maintain stability Airex pad: horizontal head turns 30 seconds scanning room 10x ; cueing for arc of motion  Airex pad: vertical head turns 30 seconds, cueing for arc of motion, noticeable sway with upward gaze increasing demand on ankle righting reaction musculature Airex pad: one foot on 6" step one foot on airex pad, hold position for 30 seconds, switch legs, 2x each LE;  Find cones scattered throughout gym with visual scanning and object negotiation without AFO with SPC x8 cones    PATIENT EDUCATION: Education details: exercise technique, body mechanics Person educated: Patient Education method: Explanation, Demonstration, and Tactile cues Education comprehension: verbalized understanding, returned demonstration, verbal cues required, and  tactile cues required   HOME EXERCISE PROGRAM: Continue as previously given  Added df/pf  exercise to HEP -11/02/21   PT Short Term Goals -      PT SHORT TERM GOAL #1   Title Patient will be independent in home exercise program to improve strength/mobility for better functional independence with ADLs.    Baseline 5/4: HEP given 6/20: does HEP 2x a week 7/31: 3-4x a week   Time 4   Period Weeks    Status Achieved    Target Date 01/05/22             PT Long Term Goals       PT LONG TERM GOAL #1   Title Patient will increase FOTO score to equal to or greater than 63%    to demonstrate statistically significant improvement in mobility and quality of life.    Baseline 5/4: 48%, 6/20: 60% , 7/31: 60%   Time 8   Period Weeks    Status Partially Met    Target Date 02/05/22     PT LONG TERM GOAL #  2   Title Patient will increase 10 meter walk test to >1.91m/s as to improve gait speed for better community ambulation and to reduce fall risk.    Baseline 5/4: 0.54 m/s with RW, 6/20: 0.63 m/s 7/31: 0.57 with SPC   Time 8   Period Weeks    Status Partially Met    Target Date 02/05/22     PT LONG TERM GOAL #3   Title Patient will demonstrate an improved Berg Balance Score of >45/56 as to demonstrate improved balance with ADLs such as sitting/standing and transfer balance and reduced fall risk.    Baseline 5/4: 32/56, 6/20: 45/56 , 7/31: 49/56   Time 8   Period Weeks    Status Achieved;   Target Date 02/05/22     PT LONG TERM GOAL #4   Title Patient will increase BLE gross strength to 4+/5 as to improve functional strength for independent gait, increased standing tolerance and increased ADL ability.    Baseline 5/4: see note, 6/20:R knee 4/5 , 7/31: not formally assessed;    Time 8   Period Weeks    Status Partially Met    Target Date 02/05/22               Plan     Clinical Impression Statement Patient is able to stabilize without use of brace this session. Her  ankle righting reactions are improving.with decreased need for UE support. She is highly motivated throughout session but continues to have limited spatial awareness of R ankle/foot requiring visual cueing for placement. Low back pain is present during session requiring use of heat pad. The pt will continue to benefit from skilled PT services to progress mobility and reduce risk of falls.    Personal Factors and Comorbidities Comorbidity 3+;Fitness;Past/Current Experience;Time since onset of injury/illness/exacerbation;Transportation    Comorbidities hepatitis B, arthritis, diverticulitis, GERD, hyperlipidemia, hypertension    Examination-Activity Limitations Bed Mobility;Caring for [employer];Locomotion Level;Lift;Squat;Stairs;Stand;Toileting;Transfers    Examination-Participation Restrictions Cleaning;Driving;Meal Prep;Laundry;Shop;Volunteer;Yard Work    Conservation officer, historic buildings Evolving/Moderate complexity    Rehab Potential Fair    PT Frequency 1x / week    PT Duration 8 weeks    PT Treatment/Interventions ADLs/Self Care Home Management;Canalith Repostioning;Cryotherapy;Electrical Stimulation;Iontophoresis 4mg /ml Dexamethasone;Moist Heat;Traction;Ultrasound;Functional mobility training;Stair training;Gait training;DME Instruction;Therapeutic activities;Therapeutic exercise;Balance training;Neuromuscular re-education;Manual techniques;Orthotic Fit/Training;Patient/family education;Passive range of motion;Dry needling;Taping;Splinting;Energy conservation;Visual/perceptual remediation/compensation;Vestibular    PT Next Visit Plan progress stabilization and strength    PT Home Exercise Plan see above    Consulted and Agree with Plan of Care Patient             Precious Bard PT  Physical Therapist- [facility]  [facility]  01/09/2022, 9:30 AM

## 2022-01-09 ENCOUNTER — Encounter: Payer: Self-pay | Admitting: Physical Medicine & Rehabilitation

## 2022-01-09 ENCOUNTER — Encounter: Payer: Medicare Other | Attending: Registered Nurse | Admitting: Physical Medicine & Rehabilitation

## 2022-01-09 ENCOUNTER — Ambulatory Visit: Payer: Medicare Other

## 2022-01-09 VITALS — BP 135/86 | HR 70 | Ht 62.0 in | Wt 222.2 lb

## 2022-01-09 DIAGNOSIS — R2681 Unsteadiness on feet: Secondary | ICD-10-CM

## 2022-01-09 DIAGNOSIS — I779 Disorder of arteries and arterioles, unspecified: Secondary | ICD-10-CM | POA: Diagnosis not present

## 2022-01-09 DIAGNOSIS — G8111 Spastic hemiplegia affecting right dominant side: Secondary | ICD-10-CM

## 2022-01-09 DIAGNOSIS — M6281 Muscle weakness (generalized): Secondary | ICD-10-CM | POA: Diagnosis not present

## 2022-01-09 DIAGNOSIS — R269 Unspecified abnormalities of gait and mobility: Secondary | ICD-10-CM | POA: Diagnosis not present

## 2022-01-09 DIAGNOSIS — I619 Nontraumatic intracerebral hemorrhage, unspecified: Secondary | ICD-10-CM | POA: Diagnosis not present

## 2022-01-09 DIAGNOSIS — R262 Difficulty in walking, not elsewhere classified: Secondary | ICD-10-CM | POA: Diagnosis not present

## 2022-01-09 DIAGNOSIS — E7800 Pure hypercholesterolemia, unspecified: Secondary | ICD-10-CM | POA: Diagnosis not present

## 2022-01-09 DIAGNOSIS — R278 Other lack of coordination: Secondary | ICD-10-CM

## 2022-01-09 MED ORDER — INCOBOTULINUMTOXINA 100 UNITS IM SOLR
100.0000 [IU] | Freq: Once | INTRAMUSCULAR | Status: AC
  Administered 2022-01-09: 300 [IU] via INTRAMUSCULAR

## 2022-01-09 NOTE — Progress Notes (Signed)
Xeomin Injection for spasticity using needle EMG guidance  Dilution: 50 Units/ml Indication: Severe spasticity which interferes with ADL,mobility and/or  hygiene and is unresponsive to medication management and other conservative care Informed consent was obtained after describing risks and benefits of the procedure with the patient. This includes bleeding, bruising, infection, excessive weakness, or medication side effects. A REMS form is on file and signed. Needle: 25g 2" needle electrode Number of units per muscle RIGHT FHL 75 2+ FDL 75 2+ PT 50 1+ Med Gastr 50 2+ Lat Gastroc 0 Waste 50U   All injections were done after obtaining appropriate EMG activity and after negative drawback for blood. The patient tolerated the procedure well. Post procedure instructions were given. A followup appointment was made.   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

## 2022-01-09 NOTE — Patient Instructions (Signed)

## 2022-01-10 ENCOUNTER — Ambulatory Visit: Payer: Medicare Other | Admitting: Physical Therapy

## 2022-01-12 ENCOUNTER — Ambulatory Visit: Payer: Medicare Other

## 2022-01-16 ENCOUNTER — Ambulatory Visit: Payer: Medicare Other | Attending: Physical Medicine & Rehabilitation

## 2022-01-16 DIAGNOSIS — R269 Unspecified abnormalities of gait and mobility: Secondary | ICD-10-CM | POA: Diagnosis not present

## 2022-01-16 DIAGNOSIS — R2681 Unsteadiness on feet: Secondary | ICD-10-CM | POA: Diagnosis not present

## 2022-01-16 DIAGNOSIS — R262 Difficulty in walking, not elsewhere classified: Secondary | ICD-10-CM | POA: Diagnosis not present

## 2022-01-16 DIAGNOSIS — R278 Other lack of coordination: Secondary | ICD-10-CM | POA: Diagnosis not present

## 2022-01-16 DIAGNOSIS — M6281 Muscle weakness (generalized): Secondary | ICD-10-CM | POA: Diagnosis not present

## 2022-01-16 NOTE — Therapy (Signed)
OUTPATIENT PHYSICAL THERAPY TREATMENT NOTE   Patient Name: Terri Werner MRN: [id number] DOB:1951/01/11, 71 y.o., female Today's Date: 01/16/2022  PCP: Margarita Mail DO  REFERRING PROVIDER: Erick Colace MD    PT End of Session - 01/16/22 0812     Visit Number 28    Number of Visits 32    Date for PT Re-Evaluation 02/05/22    Authorization Type BCBS Medicare    Progress Note Due on Visit 30    PT Start Time 0845    PT Stop Time 0925    PT Time Calculation (min) 40 min    Equipment Utilized During Treatment Gait belt    Activity Tolerance Patient tolerated treatment well    Behavior During Therapy WFL for tasks assessed/performed                     Past Medical History:  Diagnosis Date   Arthritis    pt reports in back & knees   Colon polyps 2010   At [facility]   Diverticulitis 2016   Genital warts    GERD (gastroesophageal reflux disease)    Heart murmur    Hepatitis B    History of blood transfusion    during each major surgery per pt   History of chicken pox    History of hiatal hernia    History of torn meniscus of right knee    HTN (hypertension)    Hx of migraine headaches    Hypertension    Hypopotassemia    Past Surgical History:  Procedure Laterality Date   ABDOMINAL HYSTERECTOMY  2006   APPENDECTOMY  1980   CESAREAN SECTION  1981   COLONOSCOPY WITH PROPOFOL N/A 11/01/2014   Procedure: COLONOSCOPY WITH PROPOFOL;  Surgeon: Scot Jun, MD;  Location: [facility] ENDOSCOPY;  Service: Endoscopy;  Laterality: N/A;   COLONOSCOPY WITH PROPOFOL N/A 01/05/2020   Procedure: COLONOSCOPY WITH PROPOFOL;  Surgeon: Regis Bill, MD;  Location: [facility] ENDOSCOPY;  Service: Endoscopy;  Laterality: N/A;   ECTOPIC PREGNANCY SURGERY  1980   HERNIA REPAIR  2006   Umbilical   KNEE ARTHROSCOPY Left 2006   torn meniscus   TONSILLECTOMY  1971   TOTAL ABDOMINAL HYSTERECTOMY W/ BILATERAL SALPINGOOPHORECTOMY  2006   VESICOVAGINAL FISTULA  CLOSURE W/ TAH     Patient Active Problem List   Diagnosis Date Noted   Hx of spontaneous intraparenchymal intracranial hemorrhage 07/12/2021   Intraparenchymal hemorrhage of brain (HCC) 05/31/2021   ICH (intracerebral hemorrhage) (HCC) 05/27/2021   Morbid obesity (HCC) 09/24/2018   Unspecified inflammatory spondylopathy, lumbar region (HCC) 03/05/2018   Lumbar spondylolysis 08/20/2017   Lumbar facet arthropathy 08/20/2017   Lumbar degenerative disc disease 08/20/2017   Chronic constipation 07/30/2017   Primary osteoarthritis involving multiple joints 04/17/2017   Postmenopausal 10/12/2016   Prediabetes 10/12/2016   Lumbago 08/04/2015   Diverticulitis of colon 07/05/2014   Carotid artery disorder (HCC) 04/27/2014   Hyperlipidemia 10/08/2013   Hypertension, benign 11/19/2009    REFERRING DIAG: R spastic hemiplegia   THERAPY DIAG:  Other lack of coordination  Muscle weakness (generalized)  Unsteadiness on feet  Difficulty in walking, not elsewhere classified  Abnormality of gait and mobility  Rationale for Evaluation and Treatment Rehabilitation  PERTINENT HISTORY: Terri Werner is a 71 y.o. right-handed female with history of hepatitis B, arthritis, diverticulitis, GERD, hyperlipidemia, hypertension. Patient is recently retired. She has a daughter and niece in the area. Presented 05/27/2021 with sudden onset of  OUTPATIENT PHYSICAL THERAPY TREATMENT NOTE   Patient Name: Terri Werner MRN: [id number] DOB:11-06-1950, 71 y.o., female Today's Date: 01/16/2022  PCP: Teodora Medici DO  REFERRING PROVIDER: Charlett Blake MD    PT End of Session - 01/16/22 0812     Visit Number 28    Number of Visits 32    Date for PT Re-Evaluation 02/05/22    Authorization Type BCBS Medicare    Progress Note Due on Visit 63    PT Start Time 0845    PT Stop Time 0925    PT Time Calculation (min) 40 min    Equipment Utilized During Treatment Gait belt    Activity Tolerance Patient tolerated treatment well    Behavior During Therapy WFL for tasks assessed/performed                     Past Medical History:  Diagnosis Date   Arthritis    pt reports in back & knees   Colon polyps 2010   At [facility]   Diverticulitis 2016   Genital warts    GERD (gastroesophageal reflux disease)    Heart murmur    Hepatitis B    History of blood transfusion    during each major surgery per pt   History of chicken pox    History of hiatal hernia    History of torn meniscus of right knee    HTN (hypertension)    Hx of migraine headaches    Hypertension    Hypopotassemia    Past Surgical History:  Procedure Laterality Date   ABDOMINAL HYSTERECTOMY  2006   [place]   COLONOSCOPY WITH PROPOFOL N/A 11/01/2014   Procedure: COLONOSCOPY WITH PROPOFOL;  Surgeon: Manya Silvas, MD;  Location: [place];  Service: Endoscopy;  Laterality: N/A;   COLONOSCOPY WITH PROPOFOL N/A 01/05/2020   Procedure: COLONOSCOPY WITH PROPOFOL;  Surgeon: [name], MD;  Location: [facility] ENDOSCOPY;  Service: Endoscopy;  Laterality: N/A;   [place]   HERNIA REPAIR  [id number]   Umbilical   KNEE ARTHROSCOPY Left 2006   torn meniscus   TONSILLECTOMY  1971   TOTAL ABDOMINAL HYSTERECTOMY W/ BILATERAL SALPINGOOPHORECTOMY  2006   VESICOVAGINAL FISTULA  CLOSURE W/ TAH     Patient Active Problem List   Diagnosis Date Noted   Hx of spontaneous intraparenchymal intracranial hemorrhage 07/12/2021   Intraparenchymal hemorrhage of brain ([place]) 05/31/2021   ICH (intracerebral hemorrhage) ([place]) 05/27/2021   Morbid obesity ([place]) 09/24/2018   Unspecified inflammatory spondylopathy, lumbar region ([place]) 03/05/2018   Lumbar spondylolysis 08/20/2017   Lumbar facet arthropathy 08/20/2017   Lumbar degenerative disc disease 08/20/2017   Chronic constipation 07/30/2017   Primary osteoarthritis involving multiple joints 04/17/2017   Postmenopausal 10/12/2016   Prediabetes 10/12/2016   Lumbago 08/04/2015   Diverticulitis of colon 07/05/2014   Carotid artery disorder ([place]) 04/27/2014   Hyperlipidemia 10/08/2013   Hypertension, benign 11/19/2009    REFERRING DIAG: R spastic hemiplegia   THERAPY DIAG:  Other lack of coordination  Muscle weakness (generalized)  Unsteadiness on feet  Difficulty in walking, not elsewhere classified  Abnormality of gait and mobility  Rationale for Evaluation and Treatment Rehabilitation  PERTINENT HISTORY: Terri Werner is a 71 y.o. right-handed female with history of hepatitis B, arthritis, diverticulitis, GERD, hyperlipidemia, hypertension. Patient is recently retired. She has a daughter and niece in the area. Presented 05/27/2021 with sudden onset of  Title Patient will increase BLE gross strength to 4+/5 as to improve functional strength for independent gait, increased standing tolerance and increased ADL ability.    Baseline 5/4: see note, 6/20:R knee 4/5 , 7/31: not formally assessed;    Time 8   Period Weeks    Status Partially Met    Target Date 02/05/22               Plan     Clinical Impression Statement Continued our work on RLE strength and motor control training. Additional calf stretching to maximize botox efficacy. Pt tolerates session well with recovery breaks interspersed. The pt will continue to benefit from skilled PT services to progress mobility and reduce risk of falls.   Personal Factors and Comorbidities Comorbidity 3+;Fitness;Past/Current Experience;Time since onset of injury/illness/exacerbation;Transportation    Comorbidities hepatitis B, arthritis, diverticulitis, GERD, hyperlipidemia, hypertension    Examination-Activity Limitations Bed Mobility;Caring for [employer];Locomotion Level;Lift;Squat;Stairs;Stand;Toileting;Transfers    Examination-Participation Restrictions Cleaning;Driving;Meal Prep;Laundry;Shop;Volunteer;Yard Work    Conservation officer, historic buildings Evolving/Moderate complexity    Rehab Potential Fair    PT Frequency 1x / week    PT Duration 8 weeks    PT Treatment/Interventions ADLs/Self Care  Home Management;Canalith Repostioning;Cryotherapy;Electrical Stimulation;Iontophoresis 4mg /ml Dexamethasone;Moist Heat;Traction;Ultrasound;Functional mobility training;Stair training;Gait training;DME Instruction;Therapeutic activities;Therapeutic exercise;Balance training;Neuromuscular re-education;Manual techniques;Orthotic Fit/Training;Patient/family education;Passive range of motion;Dry needling;Taping;Splinting;Energy conservation;Visual/perceptual remediation/compensation;Vestibular    PT Next Visit Plan progress balance     PT Home Exercise Plan see above    Consulted and Agree with Plan of Care Patient             Rosamaria Lints PT   8:13 AM, 01/16/22 Rosamaria Lints, PT, DPT Physical Therapist -  [facility]  Outpatient Physical Therapy- Main Campus [phone]     Physical Therapist- [facility]  01/16/2022, 8:13 AM

## 2022-01-17 ENCOUNTER — Ambulatory Visit: Payer: Medicare Other | Admitting: Physical Therapy

## 2022-01-19 ENCOUNTER — Ambulatory Visit: Payer: Medicare Other

## 2022-01-22 ENCOUNTER — Ambulatory Visit: Payer: Medicare Other | Admitting: Physical Therapy

## 2022-01-23 ENCOUNTER — Ambulatory Visit: Payer: Medicare Other | Attending: Physical Medicine & Rehabilitation

## 2022-01-23 DIAGNOSIS — R269 Unspecified abnormalities of gait and mobility: Secondary | ICD-10-CM | POA: Diagnosis not present

## 2022-01-23 DIAGNOSIS — R2681 Unsteadiness on feet: Secondary | ICD-10-CM | POA: Diagnosis not present

## 2022-01-23 DIAGNOSIS — R262 Difficulty in walking, not elsewhere classified: Secondary | ICD-10-CM | POA: Insufficient documentation

## 2022-01-23 DIAGNOSIS — I619 Nontraumatic intracerebral hemorrhage, unspecified: Secondary | ICD-10-CM | POA: Diagnosis not present

## 2022-01-23 DIAGNOSIS — R278 Other lack of coordination: Secondary | ICD-10-CM | POA: Diagnosis not present

## 2022-01-23 DIAGNOSIS — M6281 Muscle weakness (generalized): Secondary | ICD-10-CM | POA: Insufficient documentation

## 2022-01-23 DIAGNOSIS — M5459 Other low back pain: Secondary | ICD-10-CM | POA: Diagnosis not present

## 2022-01-23 NOTE — Therapy (Signed)
OUTPATIENT PHYSICAL THERAPY TREATMENT NOTE   Patient Name: Terri Werner MRN: [id number] DOB:11-19-1950, 71 y.o., female Today's Date: 01/23/2022  PCP: Teodora Medici DO  REFERRING PROVIDER: Charlett Blake MD    PT End of Session - 01/23/22 1114     Visit Number 29    Number of Visits 32    Date for PT Re-Evaluation 02/05/22    Authorization Type BCBS Medicare    Progress Note Due on Visit 85    PT Start Time 1114    PT Stop Time 1145    PT Time Calculation (min) 31 min    Equipment Utilized During Treatment Gait belt    Activity Tolerance Patient tolerated treatment well    Behavior During Therapy WFL for tasks assessed/performed                      Past Medical History:  Diagnosis Date   Arthritis    pt reports in back & knees   Colon polyps 2010   At [facility]   Diverticulitis 2016   Genital warts    GERD (gastroesophageal reflux disease)    Heart murmur    Hepatitis B    History of blood transfusion    during each major surgery per pt   History of chicken pox    History of hiatal hernia    History of torn meniscus of right knee    HTN (hypertension)    Hx of migraine headaches    Hypertension    Hypopotassemia    Past Surgical History:  Procedure Laterality Date   ABDOMINAL HYSTERECTOMY  2006   [place]   COLONOSCOPY WITH PROPOFOL N/A 11/01/2014   Procedure: COLONOSCOPY WITH PROPOFOL;  Surgeon: Manya Silvas, MD;  Location: [place];  Service: Endoscopy;  Laterality: N/A;   COLONOSCOPY WITH PROPOFOL N/A 01/05/2020   Procedure: COLONOSCOPY WITH PROPOFOL;  Surgeon: [name], MD;  Location: [facility] ENDOSCOPY;  Service: Endoscopy;  Laterality: N/A;   [place]   HERNIA REPAIR  2637   Umbilical   KNEE ARTHROSCOPY Left 2006   torn meniscus   TONSILLECTOMY  1971   TOTAL ABDOMINAL HYSTERECTOMY W/ BILATERAL SALPINGOOPHORECTOMY  2006   VESICOVAGINAL FISTULA  CLOSURE W/ TAH     Patient Active Problem List   Diagnosis Date Noted   Hx of spontaneous intraparenchymal intracranial hemorrhage 07/12/2021   Intraparenchymal hemorrhage of brain ([place]) 05/31/2021   ICH (intracerebral hemorrhage) ([place]) 05/27/2021   Morbid obesity ([place]) 09/24/2018   Unspecified inflammatory spondylopathy, lumbar region ([place]) 03/05/2018   Lumbar spondylolysis 08/20/2017   Lumbar facet arthropathy 08/20/2017   Lumbar degenerative disc disease 08/20/2017   Chronic constipation 07/30/2017   Primary osteoarthritis involving multiple joints 04/17/2017   Postmenopausal 10/12/2016   Prediabetes 10/12/2016   Lumbago 08/04/2015   Diverticulitis of colon 07/05/2014   Carotid artery disorder ([place]) 04/27/2014   Hyperlipidemia 10/08/2013   Hypertension, benign 11/19/2009    REFERRING DIAG: R spastic hemiplegia   THERAPY DIAG:  Other lack of coordination  Muscle weakness (generalized)  Unsteadiness on feet  Difficulty in walking, not elsewhere classified  Rationale for Evaluation and Treatment Rehabilitation  PERTINENT HISTORY: Terri Werner is a 71 y.o. right-handed female with history of hepatitis B, arthritis, diverticulitis, GERD, hyperlipidemia, hypertension. Patient is recently retired. She has a daughter and niece in the area. Presented 05/27/2021 with sudden onset of right leg weakness and mild  OUTPATIENT PHYSICAL THERAPY TREATMENT NOTE   Patient Name: Terri Werner MRN: [id number] DOB:11-19-1950, 71 y.o., female Today's Date: 01/23/2022  PCP: Teodora Medici DO  REFERRING PROVIDER: Charlett Blake MD    PT End of Session - 01/23/22 1114     Visit Number 29    Number of Visits 32    Date for PT Re-Evaluation 02/05/22    Authorization Type BCBS Medicare    Progress Note Due on Visit 85    PT Start Time 1114    PT Stop Time 1145    PT Time Calculation (min) 31 min    Equipment Utilized During Treatment Gait belt    Activity Tolerance Patient tolerated treatment well    Behavior During Therapy WFL for tasks assessed/performed                      Past Medical History:  Diagnosis Date   Arthritis    pt reports in back & knees   Colon polyps 2010   At [facility]   Diverticulitis 2016   Genital warts    GERD (gastroesophageal reflux disease)    Heart murmur    Hepatitis B    History of blood transfusion    during each major surgery per pt   History of chicken pox    History of hiatal hernia    History of torn meniscus of right knee    HTN (hypertension)    Hx of migraine headaches    Hypertension    Hypopotassemia    Past Surgical History:  Procedure Laterality Date   ABDOMINAL HYSTERECTOMY  2006   [place]   COLONOSCOPY WITH PROPOFOL N/A 11/01/2014   Procedure: COLONOSCOPY WITH PROPOFOL;  Surgeon: Manya Silvas, MD;  Location: [place];  Service: Endoscopy;  Laterality: N/A;   COLONOSCOPY WITH PROPOFOL N/A 01/05/2020   Procedure: COLONOSCOPY WITH PROPOFOL;  Surgeon: [name], MD;  Location: [facility] ENDOSCOPY;  Service: Endoscopy;  Laterality: N/A;   [place]   HERNIA REPAIR  2637   Umbilical   KNEE ARTHROSCOPY Left 2006   torn meniscus   TONSILLECTOMY  1971   TOTAL ABDOMINAL HYSTERECTOMY W/ BILATERAL SALPINGOOPHORECTOMY  2006   VESICOVAGINAL FISTULA  CLOSURE W/ TAH     Patient Active Problem List   Diagnosis Date Noted   Hx of spontaneous intraparenchymal intracranial hemorrhage 07/12/2021   Intraparenchymal hemorrhage of brain ([place]) 05/31/2021   ICH (intracerebral hemorrhage) ([place]) 05/27/2021   Morbid obesity ([place]) 09/24/2018   Unspecified inflammatory spondylopathy, lumbar region ([place]) 03/05/2018   Lumbar spondylolysis 08/20/2017   Lumbar facet arthropathy 08/20/2017   Lumbar degenerative disc disease 08/20/2017   Chronic constipation 07/30/2017   Primary osteoarthritis involving multiple joints 04/17/2017   Postmenopausal 10/12/2016   Prediabetes 10/12/2016   Lumbago 08/04/2015   Diverticulitis of colon 07/05/2014   Carotid artery disorder ([place]) 04/27/2014   Hyperlipidemia 10/08/2013   Hypertension, benign 11/19/2009    REFERRING DIAG: R spastic hemiplegia   THERAPY DIAG:  Other lack of coordination  Muscle weakness (generalized)  Unsteadiness on feet  Difficulty in walking, not elsewhere classified  Rationale for Evaluation and Treatment Rehabilitation  PERTINENT HISTORY: Terri Werner is a 71 y.o. right-handed female with history of hepatitis B, arthritis, diverticulitis, GERD, hyperlipidemia, hypertension. Patient is recently retired. She has a daughter and niece in the area. Presented 05/27/2021 with sudden onset of right leg weakness and mild  improved balance with ADLs such as sitting/standing and transfer balance and reduced fall risk.    Baseline 5/4: 32/56, 6/20: 45/56 , 7/31: 49/56   Time 8   Period Weeks    Status Achieved;   Target Date 02/05/22     PT LONG TERM GOAL #4   Title Patient will increase BLE gross strength to 4+/5 as to improve functional strength for independent gait, increased standing tolerance and increased ADL ability.    Baseline 5/4: see note, 6/20:R knee 4/5 , 7/31: not formally assessed;    Time 8   Period Weeks    Status Partially Met    Target Date 02/05/22               Plan     Clinical Impression Statement Patient session limited by late arrival. She has significant back pain requiring use of heat pad between interventions for tolerance of activity. Patient remains highly motivated and presents without AFO this session. Will be getting a fitted AFO on Thursday 01/25/22.  The pt will continue to benefit from skilled PT services to progress mobility and reduce risk of falls.    Personal Factors and Comorbidities Comorbidity 3+;Fitness;Past/Current Experience;Time since onset of injury/illness/exacerbation;Transportation    Comorbidities hepatitis B, arthritis, diverticulitis, GERD,  hyperlipidemia, hypertension    Examination-Activity Limitations Bed Mobility;Caring for [employer];Locomotion Level;Lift;Squat;Stairs;Stand;Toileting;Transfers    Examination-Participation Restrictions Cleaning;Driving;Meal Prep;Laundry;Shop;Volunteer;Yard Work    Conservation officer, historic buildings Evolving/Moderate complexity    Rehab Potential Fair    PT Frequency 1x / week    PT Duration 8 weeks    PT Treatment/Interventions ADLs/Self Care Home Management;Canalith Repostioning;Cryotherapy;Electrical Stimulation;Iontophoresis 4mg /ml Dexamethasone;Moist Heat;Traction;Ultrasound;Functional mobility training;Stair training;Gait training;DME Instruction;Therapeutic activities;Therapeutic exercise;Balance training;Neuromuscular re-education;Manual techniques;Orthotic Fit/Training;Patient/family education;Passive range of motion;Dry needling;Taping;Splinting;Energy conservation;Visual/perceptual remediation/compensation;Vestibular    PT Next Visit Plan progress stabilization and strength    PT Home Exercise Plan see above    Consulted and Agree with Plan of Care Patient             Precious Bard PT  Physical Therapist- [facility]  01/23/2022, 11:52 AM

## 2022-01-24 ENCOUNTER — Ambulatory Visit: Payer: Medicare Other | Admitting: Physical Therapy

## 2022-01-26 ENCOUNTER — Ambulatory Visit: Payer: Medicare Other

## 2022-01-26 DIAGNOSIS — I619 Nontraumatic intracerebral hemorrhage, unspecified: Secondary | ICD-10-CM | POA: Diagnosis not present

## 2022-01-26 DIAGNOSIS — M5459 Other low back pain: Secondary | ICD-10-CM

## 2022-01-26 DIAGNOSIS — R278 Other lack of coordination: Secondary | ICD-10-CM | POA: Diagnosis not present

## 2022-01-26 DIAGNOSIS — M6281 Muscle weakness (generalized): Secondary | ICD-10-CM

## 2022-01-26 DIAGNOSIS — R269 Unspecified abnormalities of gait and mobility: Secondary | ICD-10-CM | POA: Diagnosis not present

## 2022-01-26 DIAGNOSIS — R262 Difficulty in walking, not elsewhere classified: Secondary | ICD-10-CM | POA: Diagnosis not present

## 2022-01-26 DIAGNOSIS — R2681 Unsteadiness on feet: Secondary | ICD-10-CM

## 2022-01-26 NOTE — Therapy (Signed)
OUTPATIENT PHYSICAL THERAPY TREATMENT NOTE/Physical Therapy Progress Note/RECERT   Dates of reporting period  12/11/21   to   01/26/2022    Patient Name: Terri Werner MRN: [id number] DOB:[birth year], 71 y.o., female Today's Date: 01/26/2022  PCP: Margarita Mail DO  REFERRING PROVIDER: Erick Colace MD    PT End of Session - 01/26/22 0805     Visit Number 30    Number of Visits 42    Date for PT Re-Evaluation 04/20/22    Authorization Type BCBS Medicare    Authorization Time Period 01/26/22-04/20/22    Progress Note Due on Visit 30    PT Start Time 0805    PT Stop Time 0845    PT Time Calculation (min) 40 min    Equipment Utilized During Treatment Gait belt    Activity Tolerance Patient tolerated treatment well;Patient limited by pain    Behavior During Therapy WFL for tasks assessed/performed                       Past Medical History:  Diagnosis Date   Arthritis    pt reports in back & knees   Colon polyps 2010   At [facility]   Diverticulitis 2016   Genital warts    GERD (gastroesophageal reflux disease)    Heart murmur    Hepatitis B    History of blood transfusion    during each major surgery per pt   History of chicken pox    History of hiatal hernia    History of torn meniscus of right knee    HTN (hypertension)    Hx of migraine headaches    Hypertension    Hypopotassemia    Past Surgical History:  Procedure Laterality Date   ABDOMINAL HYSTERECTOMY  2006   APPENDECTOMY  1980   CESAREAN SECTION  1981   COLONOSCOPY WITH PROPOFOL N/A 11/01/2014   Procedure: COLONOSCOPY WITH PROPOFOL;  Surgeon: Scot Jun, MD;  Location: [facility] ENDOSCOPY;  Service: Endoscopy;  Laterality: N/A;   COLONOSCOPY WITH PROPOFOL N/A 01/05/2020   Procedure: COLONOSCOPY WITH PROPOFOL;  Surgeon: Regis Bill, MD;  Location: [facility] ENDOSCOPY;  Service: Endoscopy;  Laterality: N/A;   ECTOPIC PREGNANCY SURGERY  1980   HERNIA REPAIR  2006   [place]    KNEE ARTHROSCOPY Left 2006   torn meniscus   TONSILLECTOMY  1971   TOTAL ABDOMINAL HYSTERECTOMY W/ BILATERAL SALPINGOOPHORECTOMY  2006   VESICOVAGINAL FISTULA CLOSURE W/ TAH     Patient Active Problem List   Diagnosis Date Noted   Hx of spontaneous intraparenchymal intracranial hemorrhage 07/12/2021   Intraparenchymal hemorrhage of brain ([facility]) 05/31/2021   ICH (intracerebral hemorrhage) ([facility]) 05/27/2021   Morbid obesity ([facility]) 09/24/2018   Unspecified inflammatory spondylopathy, lumbar region ([facility]) 03/05/2018   Lumbar spondylolysis 08/20/2017   Lumbar facet arthropathy 08/20/2017   Lumbar degenerative disc disease 08/20/2017   Chronic constipation 07/30/2017   Primary osteoarthritis involving multiple joints 04/17/2017   Postmenopausal 10/12/2016   Prediabetes 10/12/2016   Lumbago 08/04/2015   Diverticulitis of colon 07/05/2014   Carotid artery disorder ([facility]) 04/27/2014   Hyperlipidemia 10/08/2013   Hypertension, benign 11/19/2009    REFERRING DIAG: R spastic hemiplegia   THERAPY DIAG:  Other low back pain  Unsteadiness on feet  Muscle weakness (generalized)  Difficulty in walking, not elsewhere classified  Rationale for Evaluation and Treatment Rehabilitation  PERTINENT HISTORY: Terri Werner is a 71 y.o. right-handed female with history of hepatitis B,  arthritis, diverticulitis, GERD, hyperlipidemia, hypertension. Patient is recently retired. She has a daughter and niece in the area. Presented 05/27/2021 with sudden onset of right leg weakness and mild headache. Cranial CT scan showed acute parenchymal hemorrhage in the parasagittal left frontal parietal lobes with mild edema. No substantial mass effect. CT angiogram head and neck no abnormal vascularity in the region of hemorrhage no hemodynamically significant stenosis. MRI follow-up showed no mass effect seen underlying the left posterior frontal hematoma. Patient admitted to inpatient rehab on 05/31/21 and  discharged on 06/27/21. Patient used home health at home that stopped two weeks ago. Patient has R foot drop. Has a wheelchair she does household chores in, a RW, a quad cane and R AFO  PRECAUTIONS: fall  SUBJECTIVE:Since trip to [place] pt with increased back pain. She reports knee pain as well.  Reports back pain worsens with being still and improves with movement. Feels back pain is limiting her. Was fitted for AFO, and is now waiting to receive it.  PAIN:  Are you having pain? 7/10, LBP     TODAY'S TREATMENT:   TherAct: Goals retested for progress note. PT instructed pt in technique with testing throughout and indications of test performance on progress and plan. Please refer to goal section below for details.   There.ex: Seated: Heat donned to low back while pt performs the following (no adverse reaction to heat, pt reports nothing on low back currently/nothing that would prohibit use of heat): Large swiss ball forward/backward rollout 12x. Reports improves pain. Large swiss ball LTL rollout 12x. Reports improves pain. Seated adduction ball squeeze 10x 3 second holds  Seated adduction ball squeeze with LAQ 10x each LE     PATIENT EDUCATION: Education details: goals, progress, plan, exercise technique, body mechanics Person educated: Patient Education method: Explanation, Demonstration, and Tactile cues Education comprehension: verbalized understanding, returned demonstration, verbal cues required, and tactile cues required   HOME EXERCISE PROGRAM: Continue as previously given  Added df/pf  exercise to HEP -11/02/21   PT Short Term Goals -      PT SHORT TERM GOAL #1   Title Patient will be independent in home exercise program to improve strength/mobility for better functional independence with ADLs.    Baseline 5/4: HEP given 6/20: does HEP 2x a week 7/31: 3-4x a week   Time 4   Period Weeks    Status Achieved    Target Date 01/05/22             PT Long Term  Goals       PT LONG TERM GOAL #1   Title Patient will increase FOTO score to equal to or greater than 63%    to demonstrate statistically significant improvement in mobility and quality of life.    Baseline 5/4: 48%, 6/20: 60% , 7/31: 60%;9/15: 52%   Time 12   Period Weeks    Status Partially Met    Target Date 04/20/2022     PT LONG TERM GOAL #2   Title Patient will increase 10 meter walk test to >1.16m/s as to improve gait speed for better community ambulation and to reduce fall risk.    Baseline 5/4: 0.54 m/s with RW, 6/20: 0.63 m/s 7/31: 0.57 ; with SPC; 9/15: 0.42 m/s with SPC, *without AFO (waiting for new one)   Time 12   Period Weeks    Status Partially Met    Target Date 04/20/2022     PT LONG TERM GOAL #3  Title Patient will demonstrate an improved Berg Balance Score of >45/56 as to demonstrate improved balance with ADLs such as sitting/standing and transfer balance and reduced fall risk.    Baseline 5/4: 32/56, 6/20: 45/56 , 7/31: 49/56; 9/15: 53/56   Time 8   Period Weeks    Status Achieved;   Target Date 02/05/22     PT LONG TERM GOAL #4   Title Patient will increase BLE gross strength to 4+/5 as to improve functional strength for independent gait, increased standing tolerance and increased ADL ability.    Baseline 5/4: see note, 6/20:R knee 4/5 , 7/31: not formally assessed; 9/15: BLE strength is grossly 4/5    Time 12   Period Weeks    Status Partially Met    Target Date 04/20/2022   PT LONG TERM GOAL #5  Title  Patient will increase ABC scale score >80% to demonstrate better functional mobility and better confidence with ADLs.   Baseline 9/15:  Time 12  Period Weeks   Status NEW  Target Date 04/20/2022              Plan     Clinical Impression Statement Goals reassessed for progress note/recert. Pt with decrease on and FOTO. Pt without AFO today likely impacting her scores and performance as she demonstrates incoordination of R ankle with  gait/upright activities. Pt just fitted for new AFO and waiting to receive it. Additionally, pt presents with increased LBP, she reports has been affecting her mobility. PT recert to address LBP as well as this is impacting pt's general functional mobility and gait. Pt did demo improvement on BERG, however, indicating decrease in fall risk and improved balance. Patient's condition has the potential to improve in response to therapy. Maximum improvement is yet to be obtained. The anticipated improvement is attainable and reasonable in a generally predictable time. The pt will continue to benefit from skilled PT services to progress mobility, reduce risk of falls, and address and reduce LBP which is currently impacting her mobility and progress toward goals.     Personal Factors and Comorbidities Comorbidity 3+;Fitness;Past/Current Experience;Time since onset of injury/illness/exacerbation;Transportation    Comorbidities hepatitis B, arthritis, diverticulitis, GERD, hyperlipidemia, hypertension    Examination-Activity Limitations Bed Mobility;Caring for [employer];Locomotion Level;Lift;Squat;Stairs;Stand;Toileting;Transfers    Examination-Participation Restrictions Cleaning;Driving;Meal Prep;Laundry;Shop;Volunteer;Yard Work    Conservation officer, historic buildings Evolving/Moderate complexity    Rehab Potential Fair    PT Frequency 1x / week    PT Duration 12 weeks    PT Treatment/Interventions ADLs/Self Care Home Management;Canalith Repostioning;Cryotherapy;Electrical Stimulation;Iontophoresis 4mg /ml Dexamethasone;Moist Heat;Traction;Ultrasound;Functional mobility training;Stair training;Gait training;DME Instruction;Therapeutic activities;Therapeutic exercise;Balance training;Neuromuscular re-education;Manual techniques;Orthotic Fit/Training;Patient/family education;Passive range of motion;Dry needling;Taping;Splinting;Energy conservation;Visual/perceptual  remediation/compensation;Vestibular    PT Next Visit Plan progress stabilization and strength    PT Home Exercise Plan see above    Consulted and Agree with Plan of Care Patient             Baird Kay PT  Physical Therapist- [facility]  01/26/2022, 9:48 AM

## 2022-01-29 ENCOUNTER — Ambulatory Visit: Payer: Medicare Other | Admitting: Physical Therapy

## 2022-01-29 ENCOUNTER — Ambulatory Visit: Payer: Medicare Other

## 2022-01-29 DIAGNOSIS — M5459 Other low back pain: Secondary | ICD-10-CM

## 2022-01-29 DIAGNOSIS — R2681 Unsteadiness on feet: Secondary | ICD-10-CM | POA: Diagnosis not present

## 2022-01-29 DIAGNOSIS — R262 Difficulty in walking, not elsewhere classified: Secondary | ICD-10-CM

## 2022-01-29 DIAGNOSIS — I619 Nontraumatic intracerebral hemorrhage, unspecified: Secondary | ICD-10-CM | POA: Diagnosis not present

## 2022-01-29 DIAGNOSIS — M6281 Muscle weakness (generalized): Secondary | ICD-10-CM | POA: Diagnosis not present

## 2022-01-29 DIAGNOSIS — R278 Other lack of coordination: Secondary | ICD-10-CM

## 2022-01-29 DIAGNOSIS — R269 Unspecified abnormalities of gait and mobility: Secondary | ICD-10-CM | POA: Diagnosis not present

## 2022-01-29 NOTE — Therapy (Unsigned)
OUTPATIENT PHYSICAL THERAPY TREATMENT NOTE     Patient Name: Terri Werner MRN: [id number] DOB:09/04/1950, 71 y.o., female Today's Date: 01/29/2022  PCP: Andrews, Elisabeth DO  REFERRING PROVIDER: Kirsteins, Andrew E MD    PT End of Session - 01/29/22 1024     Visit Number 31    Number of Visits 42    Date for PT Re-Evaluation 04/20/22    Authorization Type BCBS Medicare    Authorization Time Period 01/26/22-04/20/22    Progress Note Due on Visit 50    PT Start Time 1020    PT Stop Time 1100    PT Time Calculation (min) 40 min    Equipment Utilized During Treatment Gait belt    Activity Tolerance Patient tolerated treatment well;Patient limited by pain    Behavior During Therapy WFL for tasks assessed/performed                       Past Medical History:  Diagnosis Date   Arthritis    pt reports in back & knees   Colon polyps 2010   At [facility]   Diverticulitis 2016   Genital warts    GERD (gastroesophageal reflux disease)    Heart murmur    Hepatitis B    History of blood transfusion    during each major surgery per pt   History of chicken pox    History of hiatal hernia    History of torn meniscus of right knee    HTN (hypertension)    Hx of migraine headaches    Hypertension    Hypopotassemia    Past Surgical History:  Procedure Laterality Date   ABDOMINAL HYSTERECTOMY  2006   APPENDECTOMY  1980   CESAREAN SECTION  1981   COLONOSCOPY WITH PROPOFOL N/A 11/01/2014   Procedure: COLONOSCOPY WITH PROPOFOL;  Surgeon: Robert T Elliott, MD;  Location: [facility];  Service: Endoscopy;  Laterality: N/A;   COLONOSCOPY WITH PROPOFOL N/A 01/05/2020   Procedure: COLONOSCOPY WITH PROPOFOL;  Surgeon: Locklear, Cameron T, MD;  Location: [facility];  Service: Endoscopy;  Laterality: N/A;   ECTOPIC PREGNANCY SURGERY  1980   HERNIA REPAIR  2006   Umbilical   KNEE ARTHROSCOPY Left 2006   torn meniscus   TONSILLECTOMY  1971   TOTAL ABDOMINAL  HYSTERECTOMY W/ BILATERAL SALPINGOOPHORECTOMY  2006   VESICOVAGINAL FISTULA CLOSURE W/ TAH     Patient Active Problem List   Diagnosis Date Noted   Hx of spontaneous intraparenchymal intracranial hemorrhage 07/12/2021   Intraparenchymal hemorrhage of brain (HCC) 05/31/2021   ICH (intracerebral hemorrhage) (HCC) 05/27/2021   Morbid obesity (HCC) 09/24/2018   Unspecified inflammatory spondylopathy, lumbar region (HCC) 03/05/2018   Lumbar spondylolysis 08/20/2017   Lumbar facet arthropathy 08/20/2017   Lumbar degenerative disc disease 08/20/2017   Chronic constipation 07/30/2017   Primary osteoarthritis involving multiple joints 04/17/2017   Postmenopausal 10/12/2016   Prediabetes 10/12/2016   Lumbago 08/04/2015   Diverticulitis of colon 07/05/2014   Carotid artery disorder (HCC) 04/27/2014   Hyperlipidemia 10/08/2013   Hypertension, benign 11/19/2009    REFERRING DIAG: R spastic hemiplegia   THERAPY DIAG:  Unsteadiness on feet  Muscle weakness (generalized)  Difficulty in walking, not elsewhere classified  Other low back pain  Other lack of coordination  Abnormality of gait and mobility  Intraparenchymal hemorrhage of brain (HCC)  Rationale for Evaluation and Treatment Rehabilitation  PERTINENT HISTORY: Terri Werner is a 71 y.o. right-handed female with history of hepatitis   OUTPATIENT PHYSICAL THERAPY TREATMENT NOTE     Patient Name: Terri Werner MRN: [id number] DOB:July 05, 1950, 71 y.o., female Today's Date: 01/29/2022  PCP: Teodora Medici DO  REFERRING PROVIDER: Charlett Blake MD    PT End of Session - 01/29/22 1024     Visit Number 31    Number of Visits 60    Date for PT Re-Evaluation 04/20/22    Authorization Type BCBS Medicare    Authorization Time Period 01/26/22-04/20/22    Progress Note Due on Visit 38    PT Start Time 1020    PT Stop Time 1100    PT Time Calculation (min) 40 min    Equipment Utilized During Treatment Gait belt    Activity Tolerance Patient tolerated treatment well;Patient limited by pain    Behavior During Therapy WFL for tasks assessed/performed                       Past Medical History:  Diagnosis Date   Arthritis    pt reports in back & knees   Colon polyps 2010   At [facility]   Diverticulitis 2016   Genital warts    GERD (gastroesophageal reflux disease)    Heart murmur    Hepatitis B    History of blood transfusion    during each major surgery per pt   History of chicken pox    History of hiatal hernia    History of torn meniscus of right knee    HTN (hypertension)    Hx of migraine headaches    Hypertension    Hypopotassemia    Past Surgical History:  Procedure Laterality Date   ABDOMINAL HYSTERECTOMY  2006   [place]   COLONOSCOPY WITH PROPOFOL N/A 11/01/2014   Procedure: COLONOSCOPY WITH PROPOFOL;  Surgeon: Manya Silvas, MD;  Location: [place];  Service: Endoscopy;  Laterality: N/A;   COLONOSCOPY WITH PROPOFOL N/A 01/05/2020   Procedure: COLONOSCOPY WITH PROPOFOL;  Surgeon: [name], MD;  Location: [facility];  Service: Endoscopy;  Laterality: N/A;   [place]   HERNIA REPAIR  [id number]   Umbilical   KNEE ARTHROSCOPY Left 2006   torn meniscus   TONSILLECTOMY  1971   TOTAL ABDOMINAL  HYSTERECTOMY W/ BILATERAL SALPINGOOPHORECTOMY  2006   VESICOVAGINAL FISTULA CLOSURE W/ TAH     Patient Active Problem List   Diagnosis Date Noted   Hx of spontaneous intraparenchymal intracranial hemorrhage 07/12/2021   Intraparenchymal hemorrhage of brain ([place]) 05/31/2021   ICH (intracerebral hemorrhage) ([place]) 05/27/2021   Morbid obesity ([place]) 09/24/2018   Unspecified inflammatory spondylopathy, lumbar region ([place]) 03/05/2018   Lumbar spondylolysis 08/20/2017   Lumbar facet arthropathy 08/20/2017   Lumbar degenerative disc disease 08/20/2017   Chronic constipation 07/30/2017   Primary osteoarthritis involving multiple joints 04/17/2017   Postmenopausal 10/12/2016   Prediabetes 10/12/2016   Lumbago 08/04/2015   Diverticulitis of colon 07/05/2014   Carotid artery disorder ([place]) 04/27/2014   Hyperlipidemia 10/08/2013   Hypertension, benign 11/19/2009    REFERRING DIAG: R spastic hemiplegia   THERAPY DIAG:  Unsteadiness on feet  Muscle weakness (generalized)  Difficulty in walking, not elsewhere classified  Other low back pain  Other lack of coordination  Abnormality of gait and mobility  Intraparenchymal hemorrhage of brain ([place])  Rationale for Evaluation and Treatment Rehabilitation  PERTINENT HISTORY: Terri Werner is a 71 y.o. right-handed female with history of hepatitis  OUTPATIENT PHYSICAL THERAPY TREATMENT NOTE     Patient Name: Terri Werner MRN: [id number] DOB:09/04/1950, 71 y.o., female Today's Date: 01/29/2022  PCP: Andrews, Elisabeth DO  REFERRING PROVIDER: Kirsteins, Andrew E MD    PT End of Session - 01/29/22 1024     Visit Number 31    Number of Visits 42    Date for PT Re-Evaluation 04/20/22    Authorization Type BCBS Medicare    Authorization Time Period 01/26/22-04/20/22    Progress Note Due on Visit 50    PT Start Time 1020    PT Stop Time 1100    PT Time Calculation (min) 40 min    Equipment Utilized During Treatment Gait belt    Activity Tolerance Patient tolerated treatment well;Patient limited by pain    Behavior During Therapy WFL for tasks assessed/performed                       Past Medical History:  Diagnosis Date   Arthritis    pt reports in back & knees   Colon polyps 2010   At [facility]   Diverticulitis 2016   Genital warts    GERD (gastroesophageal reflux disease)    Heart murmur    Hepatitis B    History of blood transfusion    during each major surgery per pt   History of chicken pox    History of hiatal hernia    History of torn meniscus of right knee    HTN (hypertension)    Hx of migraine headaches    Hypertension    Hypopotassemia    Past Surgical History:  Procedure Laterality Date   ABDOMINAL HYSTERECTOMY  2006   APPENDECTOMY  1980   CESAREAN SECTION  1981   COLONOSCOPY WITH PROPOFOL N/A 11/01/2014   Procedure: COLONOSCOPY WITH PROPOFOL;  Surgeon: Robert T Elliott, MD;  Location: [facility];  Service: Endoscopy;  Laterality: N/A;   COLONOSCOPY WITH PROPOFOL N/A 01/05/2020   Procedure: COLONOSCOPY WITH PROPOFOL;  Surgeon: Locklear, Cameron T, MD;  Location: [facility];  Service: Endoscopy;  Laterality: N/A;   ECTOPIC PREGNANCY SURGERY  1980   HERNIA REPAIR  2006   Umbilical   KNEE ARTHROSCOPY Left 2006   torn meniscus   TONSILLECTOMY  1971   TOTAL ABDOMINAL  HYSTERECTOMY W/ BILATERAL SALPINGOOPHORECTOMY  2006   VESICOVAGINAL FISTULA CLOSURE W/ TAH     Patient Active Problem List   Diagnosis Date Noted   Hx of spontaneous intraparenchymal intracranial hemorrhage 07/12/2021   Intraparenchymal hemorrhage of brain (HCC) 05/31/2021   ICH (intracerebral hemorrhage) (HCC) 05/27/2021   Morbid obesity (HCC) 09/24/2018   Unspecified inflammatory spondylopathy, lumbar region (HCC) 03/05/2018   Lumbar spondylolysis 08/20/2017   Lumbar facet arthropathy 08/20/2017   Lumbar degenerative disc disease 08/20/2017   Chronic constipation 07/30/2017   Primary osteoarthritis involving multiple joints 04/17/2017   Postmenopausal 10/12/2016   Prediabetes 10/12/2016   Lumbago 08/04/2015   Diverticulitis of colon 07/05/2014   Carotid artery disorder (HCC) 04/27/2014   Hyperlipidemia 10/08/2013   Hypertension, benign 11/19/2009    REFERRING DIAG: R spastic hemiplegia   THERAPY DIAG:  Unsteadiness on feet  Muscle weakness (generalized)  Difficulty in walking, not elsewhere classified  Other low back pain  Other lack of coordination  Abnormality of gait and mobility  Intraparenchymal hemorrhage of brain (HCC)  Rationale for Evaluation and Treatment Rehabilitation  PERTINENT HISTORY: Terri Werner is a 71 y.o. right-handed female with history of hepatitis

## 2022-01-31 ENCOUNTER — Ambulatory Visit: Payer: Medicare Other | Admitting: Physical Therapy

## 2022-01-31 ENCOUNTER — Other Ambulatory Visit: Payer: Self-pay | Admitting: Urology

## 2022-01-31 ENCOUNTER — Ambulatory Visit: Payer: Medicare Other

## 2022-01-31 ENCOUNTER — Ambulatory Visit (INDEPENDENT_AMBULATORY_CARE_PROVIDER_SITE_OTHER): Payer: Medicare Other | Admitting: Urology

## 2022-01-31 DIAGNOSIS — R262 Difficulty in walking, not elsewhere classified: Secondary | ICD-10-CM | POA: Diagnosis not present

## 2022-01-31 DIAGNOSIS — N321 Vesicointestinal fistula: Secondary | ICD-10-CM | POA: Diagnosis not present

## 2022-01-31 DIAGNOSIS — R350 Frequency of micturition: Secondary | ICD-10-CM

## 2022-01-31 DIAGNOSIS — R269 Unspecified abnormalities of gait and mobility: Secondary | ICD-10-CM | POA: Diagnosis not present

## 2022-01-31 DIAGNOSIS — R278 Other lack of coordination: Secondary | ICD-10-CM | POA: Diagnosis not present

## 2022-01-31 DIAGNOSIS — M5459 Other low back pain: Secondary | ICD-10-CM | POA: Diagnosis not present

## 2022-01-31 DIAGNOSIS — R2681 Unsteadiness on feet: Secondary | ICD-10-CM | POA: Diagnosis not present

## 2022-01-31 DIAGNOSIS — M6281 Muscle weakness (generalized): Secondary | ICD-10-CM | POA: Diagnosis not present

## 2022-01-31 DIAGNOSIS — N3020 Other chronic cystitis without hematuria: Secondary | ICD-10-CM | POA: Diagnosis not present

## 2022-01-31 DIAGNOSIS — I619 Nontraumatic intracerebral hemorrhage, unspecified: Secondary | ICD-10-CM | POA: Diagnosis not present

## 2022-01-31 DIAGNOSIS — R3989 Other symptoms and signs involving the genitourinary system: Secondary | ICD-10-CM

## 2022-01-31 LAB — URINALYSIS, COMPLETE
Bilirubin, UA: NEGATIVE
Glucose, UA: NEGATIVE
Ketones, UA: NEGATIVE
Nitrite, UA: NEGATIVE
Protein,UA: NEGATIVE
Specific Gravity, UA: 1.025 (ref 1.005–1.030)
Urobilinogen, Ur: 0.2 mg/dL (ref 0.2–1.0)
pH, UA: 5.5 (ref 5.0–7.5)

## 2022-01-31 LAB — MICROSCOPIC EXAMINATION

## 2022-01-31 NOTE — H&P (View-Only) (Signed)
   01/31/22  CC: Cystoscopy   HPI: 71 year old female with a personal history of pneumaturia but no evidence of fistula on CT abdomen pelvis or CT cystogram.  She reports that she continues to have intermittent pneumaturia the last time a couple weeks ago.  She not had any recent UTIs.  She has no other urinary symptoms.  No GI symptoms.  She did have a colonoscopy at the beginning of the year which she was told was unremarkable.  NED. A&Ox3.   No respiratory distress   Abd soft, NT, ND Normal external genitalia with patent urethral meatus  Cystoscopy Procedure Note  Patient identification was confirmed, informed consent was obtained, and patient was prepped using Betadine solution.  Lidocaine jelly was administered per urethral meatus.    Procedure: - Flexible cystoscope introduced, without any difficulty.   - Thorough search of the bladder revealed:    normal urethral meatus    normal urothelium except for 1 focal area, less than 1 cm on the posterior bladder wall had some cobblestoning type changes around it and appeared to be punched out with central granular mass    no stones    no ulcers     no tumors    no urethral polyps    no trabeculation  - Ureteral orifices were normal in position and appearance.  Post-Procedure: - Patient tolerated the procedure well     Assessment/ Plan:  1. Vesicointestinal fistula History and cystoscopic findings Meaux strongly support presence of a colovesical fistula  Given the somewhat unusual appearance, I have recommended a biopsy.  We discussed the risk and benefits including risk of bleeding, infection, demonstrating structures including the bladder amongst others.  All questions were answered.  She is willing to proceed as planned.  Preoperative urine culture was obtained.  In addition to the above, we discussed that we continue to manage conservatively given that she is fairly minimally symptomatic versus consideration of a much  larger bowel resection and closure of the fistulous tract.  We will discuss this further following the biopsies. - Urinalysis, Complete - CULTURE, URINE COMPREHENSIVE  2. Pneumaturia Secondary #1   Hollice Espy, MD

## 2022-01-31 NOTE — Progress Notes (Signed)
   01/31/22  CC: Cystoscopy   HPI: 71 year old female with a personal history of pneumaturia but no evidence of fistula on CT abdomen pelvis or CT cystogram.  She reports that she continues to have intermittent pneumaturia the last time a couple weeks ago.  She not had any recent UTIs.  She has no other urinary symptoms.  No GI symptoms.  She did have a colonoscopy at the beginning of the year which she was told was unremarkable.  NED. A&Ox3.   No respiratory distress   Abd soft, NT, ND Normal external genitalia with patent urethral meatus  Cystoscopy Procedure Note  Patient identification was confirmed, informed consent was obtained, and patient was prepped using Betadine solution.  Lidocaine jelly was administered per urethral meatus.    Procedure: - Flexible cystoscope introduced, without any difficulty.   - Thorough search of the bladder revealed:    normal urethral meatus    normal urothelium except for 1 focal area, less than 1 cm on the posterior bladder wall had some cobblestoning type changes around it and appeared to be punched out with central granular mass    no stones    no ulcers     no tumors    no urethral polyps    no trabeculation  - Ureteral orifices were normal in position and appearance.  Post-Procedure: - Patient tolerated the procedure well     Assessment/ Plan:  1. Vesicointestinal fistula History and cystoscopic findings Meaux strongly support presence of a colovesical fistula  Given the somewhat unusual appearance, I have recommended a biopsy.  We discussed the risk and benefits including risk of bleeding, infection, demonstrating structures including the bladder amongst others.  All questions were answered.  She is willing to proceed as planned.  Preoperative urine culture was obtained.  In addition to the above, we discussed that we continue to manage conservatively given that she is fairly minimally symptomatic versus consideration of a much  larger bowel resection and closure of the fistulous tract.  We will discuss this further following the biopsies. - Urinalysis, Complete - CULTURE, URINE COMPREHENSIVE  2. Pneumaturia Secondary #1   Hollice Espy, MD

## 2022-01-31 NOTE — Progress Notes (Signed)
Surgical Physician Order Form [facility]  * Scheduling expectation : Next Available  *Length of Case:   *Clearance needed: no  *Anticoagulation Instructions: N/A  *Aspirin Instructions: Ok to continue Aspirin  *Post-op visit Date/Instructions:  1-2 week follow up  *Diagnosis:  Colovesical fistula  *Procedure: Cystoscopy with bladder biopsy   Additional orders: N/A  -Admit type: OUTpatient  -Anesthesia: General  -VTE Prophylaxis Standing Order SCD's       Other:   -Standing Lab Orders Per Anesthesia    Lab other: None  -Standing Test orders EKG/Chest x-ray per Anesthesia       Test other:   - Medications:  Ancef 2gm IV  -Other orders:  N/A

## 2022-01-31 NOTE — Therapy (Signed)
OUTPATIENT PHYSICAL THERAPY TREATMENT NOTE     Patient Name: Terri Werner MRN: [id number] DOB:Aug 29, 1950, 71 y.o., female Today's Date: 01/31/2022  PCP: Margarita Mail DO  REFERRING PROVIDER: Erick Colace MD    PT End of Session - 01/31/22 1221     Visit Number 32    Number of Visits 42    Date for PT Re-Evaluation 04/20/22    Authorization Type BCBS Medicare    Authorization Time Period 01/26/22-04/20/22    Progress Note Due on Visit 50    PT Start Time 1149    PT Stop Time 1228    PT Time Calculation (min) 39 min    Equipment Utilized During Treatment Gait belt    Activity Tolerance Patient tolerated treatment well    Behavior During Therapy WFL for tasks assessed/performed                        Past Medical History:  Diagnosis Date   Arthritis    pt reports in back & knees   Colon polyps 2010   At [facility]   Diverticulitis 2016   Genital warts    GERD (gastroesophageal reflux disease)    Heart murmur    Hepatitis B    History of blood transfusion    during each major surgery per pt   History of chicken pox    History of hiatal hernia    History of torn meniscus of right knee    HTN (hypertension)    Hx of migraine headaches    Hypertension    Hypopotassemia    Past Surgical History:  Procedure Laterality Date   ABDOMINAL HYSTERECTOMY  2006   APPENDECTOMY  1980   CESAREAN SECTION  1981   COLONOSCOPY WITH PROPOFOL N/A 11/01/2014   Procedure: COLONOSCOPY WITH PROPOFOL;  Surgeon: Scot Jun, MD;  Location: [facility] ENDOSCOPY;  Service: Endoscopy;  Laterality: N/A;   COLONOSCOPY WITH PROPOFOL N/A 01/05/2020   Procedure: COLONOSCOPY WITH PROPOFOL;  Surgeon: Regis Bill, MD;  Location: [facility] ENDOSCOPY;  Service: Endoscopy;  Laterality: N/A;   ECTOPIC PREGNANCY SURGERY  1980   HERNIA REPAIR  2006   Umbilical   KNEE ARTHROSCOPY Left 2006   torn meniscus   TONSILLECTOMY  1971   TOTAL ABDOMINAL HYSTERECTOMY W/ BILATERAL  SALPINGOOPHORECTOMY  2006   VESICOVAGINAL FISTULA CLOSURE W/ TAH     Patient Active Problem List   Diagnosis Date Noted   Hx of spontaneous intraparenchymal intracranial hemorrhage 07/12/2021   Intraparenchymal hemorrhage of brain (HCC) 05/31/2021   ICH (intracerebral hemorrhage) (HCC) 05/27/2021   Morbid obesity (HCC) 09/24/2018   Unspecified inflammatory spondylopathy, lumbar region (HCC) 03/05/2018   Lumbar spondylolysis 08/20/2017   Lumbar facet arthropathy 08/20/2017   Lumbar degenerative disc disease 08/20/2017   Chronic constipation 07/30/2017   Primary osteoarthritis involving multiple joints 04/17/2017   Postmenopausal 10/12/2016   Prediabetes 10/12/2016   Lumbago 08/04/2015   Diverticulitis of colon 07/05/2014   Carotid artery disorder (HCC) 04/27/2014   Hyperlipidemia 10/08/2013   Hypertension, benign 11/19/2009    REFERRING DIAG: R spastic hemiplegia   THERAPY DIAG:  Other low back pain  Muscle weakness (generalized)  Difficulty in walking, not elsewhere classified  Rationale for Evaluation and Treatment Rehabilitation  PERTINENT HISTORY: Terri Werner is a 71 y.o. right-handed female with history of hepatitis B, arthritis, diverticulitis, GERD, hyperlipidemia, hypertension. Patient is recently retired. She has a daughter and niece in the area. Presented 05/27/2021 with sudden  onset of right leg weakness and mild headache. Cranial CT scan showed acute parenchymal hemorrhage in the parasagittal left frontal parietal lobes with mild edema. No substantial mass effect. CT angiogram head and neck no abnormal vascularity in the region of hemorrhage no hemodynamically significant stenosis. MRI follow-up showed no mass effect seen underlying the left posterior frontal hematoma. Patient admitted to inpatient rehab on 05/31/21 and discharged on 06/27/21. Patient used home health at home that stopped two weeks ago. Patient has R foot drop. Has a wheelchair she does  household chores in, a RW, a quad cane and R AFO  PRECAUTIONS: fall, has latex allergy must use latex-free bands  SUBJECTIVE: Pt reports she met with urologist today and is waiting for results of a scan. Pt reports back pain not bothering her currently, but that it was earlier today.She did take medication for mm spasms last night. Pt reports no falls/stumbles.  PAIN:  Are you having pain? None  at rest.     TODAY'S TREATMENT:    There.ex: Physioball rollouts x multiple reps forward and bilateral STS from chair 2x10. Pt reports intervention feels like good low back stretch  On mat table: hooklying SLR 10x each side LTRs x 2 min B Green pball hamstring curl 2x15. Reports intervention improves R side back pain Bridge - attempts 5 reps through full and modified range. Discontinued today as this increased burning/painful feeling felt in R hamstring. Hooklye adductor squeezes with p.ball 15x 3 sec holds Hooklye alt LE march 2x20 alt LE  Hooklye hip abd with latex-free yellow theraband 20x. Rates medium Open book 10x each side    PATIENT EDUCATION: Education details: Pt educated throughout session about proper posture and technique with exercises. Improved exercise technique, movement at target joints, use of target muscles after min to mod verbal, visual, tactile cues.  Person educated: Patient Education method: Explanation, Demonstration, and Tactile cues Education comprehension: verbalized understanding, returned demonstration, verbal cues required, and tactile cues required   HOME EXERCISE PROGRAM: Continue as previously given  Added df/pf  exercise to HEP -11/02/21   PT Short Term Goals -      PT SHORT TERM GOAL #1   Title Patient will be independent in home exercise program to improve strength/mobility for better functional independence with ADLs.    Baseline 5/4: HEP given 6/20: does HEP 2x a week 7/31: 3-4x a week   Time 4   Period Weeks    Status Achieved     Target Date 01/05/22             PT Long Term Goals       PT LONG TERM GOAL #1   Title Patient will increase FOTO score to equal to or greater than 63%    to demonstrate statistically significant improvement in mobility and quality of life.    Baseline 5/4: 48%, 6/20: 60% , 7/31: 60%;9/15: 52%   Time 12   Period Weeks    Status Partially Met    Target Date 04/20/2022     PT LONG TERM GOAL #2   Title Patient will increase 10 meter walk test to >1.4m/s as to improve gait speed for better community ambulation and to reduce fall risk.    Baseline 5/4: 0.54 m/s with RW, 6/20: 0.63 m/s 7/31: 0.57 ; with SPC; 9/15: 0.42 m/s with SPC, *without AFO (waiting for new one)   Time 12   Period Weeks    Status Partially Met    Target Date 04/20/2022  PT LONG TERM GOAL #3   Title Patient will demonstrate an improved Berg Balance Score of >45/56 as to demonstrate improved balance with ADLs such as sitting/standing and transfer balance and reduced fall risk.    Baseline 5/4: 32/56, 6/20: 45/56 , 7/31: 49/56; 9/15: 53/56   Time 8   Period Weeks    Status Achieved;   Target Date 02/05/22     PT LONG TERM GOAL #4   Title Patient will increase BLE gross strength to 4+/5 as to improve functional strength for independent gait, increased standing tolerance and increased ADL ability.    Baseline 5/4: see note, 6/20:R knee 4/5 , 7/31: not formally assessed; 9/15: BLE strength is grossly 4/5    Time 12   Period Weeks    Status Partially Met    Target Date 04/20/2022   PT LONG TERM GOAL #5  Title  Patient will increase ABC scale score >80% to demonstrate better functional mobility and better confidence with ADLs.   Baseline 9/15:  Time 12  Period Weeks   Status NEW  Target Date 04/20/2022              Plan     Clinical Impression Statement Continued interventions to address LBP in order to improve ease with gait and mobility. Pt responded well to flexion-based interventions, and had  increased R side low back symptoms when attempting extension based intervention (bridge). She did report that she had changes prior to stroke (approx 1 year ago she says) with urination. She is currently waiting for results from urology appointment. Will continue to monitor pt's pain, however, she might possibly benefit from consult with pelvic PT. The pt will continue to benefit from skilled PT services to progress mobility, reduce risk of falls, and address and reduce LBP which is currently impacting her mobility and progress toward goals.    Personal Factors and Comorbidities Comorbidity 3+;Fitness;Past/Current Experience;Time since onset of injury/illness/exacerbation;Transportation    Comorbidities hepatitis B, arthritis, diverticulitis, GERD, hyperlipidemia, hypertension    Examination-Activity Limitations Bed Mobility;Caring for [employer];Locomotion Level;Lift;Squat;Stairs;Stand;Toileting;Transfers    Examination-Participation Restrictions Cleaning;Driving;Meal Prep;Laundry;Shop;Volunteer;Yard Work    Conservation officer, historic buildings Evolving/Moderate complexity    Rehab Potential Fair    PT Frequency 1x / week    PT Duration 12 weeks    PT Treatment/Interventions ADLs/Self Care Home Management;Canalith Repostioning;Cryotherapy;Electrical Stimulation;Iontophoresis 4mg /ml Dexamethasone;Moist Heat;Traction;Ultrasound;Functional mobility training;Stair training;Gait training;DME Instruction;Therapeutic activities;Therapeutic exercise;Balance training;Neuromuscular re-education;Manual techniques;Orthotic Fit/Training;Patient/family education;Passive range of motion;Dry needling;Taping;Splinting;Energy conservation;Visual/perceptual remediation/compensation;Vestibular    PT Next Visit Plan progress stabilization and strength    PT Home Exercise Plan see above    Consulted and Agree with Plan of Care Patient             Baird Kay PT  2:05 PM,  01/31/22  Physical Therapist - [facility]  Outpatient Physical Therapy- Main Campus [phone]     Physical Therapist- [facility]  01/31/2022, 2:05 PM

## 2022-02-01 NOTE — Therapy (Signed)
OUTPATIENT PHYSICAL THERAPY TREATMENT NOTE     Patient Name: Terri Werner MRN: [id number] DOB:Sep 20, 1950, 71 y.o., female Today's Date: 02/05/2022  PCP: Margarita Mail DO  REFERRING PROVIDER: Erick Colace MD    PT End of Session - 02/05/22 1022     Visit Number 33    Number of Visits 42    Date for PT Re-Evaluation 04/20/22    Authorization Type BCBS Medicare    Authorization Time Period 01/26/22-04/20/22    Progress Note Due on Visit 50    PT Start Time 1022    PT Stop Time 1100    PT Time Calculation (min) 38 min    Equipment Utilized During Treatment Gait belt    Activity Tolerance Patient tolerated treatment well    Behavior During Therapy WFL for tasks assessed/performed                         Past Medical History:  Diagnosis Date   Arthritis    pt reports in back & knees   Colon polyps 2010   At [facility]   Diverticulitis 2016   Genital warts    GERD (gastroesophageal reflux disease)    Heart murmur    Hepatitis B    History of blood transfusion    during each major surgery per pt   History of chicken pox    History of hiatal hernia    History of torn meniscus of right knee    HTN (hypertension)    Hx of migraine headaches    Hypertension    Hypopotassemia    Past Surgical History:  Procedure Laterality Date   ABDOMINAL HYSTERECTOMY  2006   APPENDECTOMY  1980   CESAREAN SECTION  1981   COLONOSCOPY WITH PROPOFOL N/A 11/01/2014   Procedure: COLONOSCOPY WITH PROPOFOL;  Surgeon: Scot Jun, MD;  Location: [facility] ENDOSCOPY;  Service: Endoscopy;  Laterality: N/A;   COLONOSCOPY WITH PROPOFOL N/A 01/05/2020   Procedure: COLONOSCOPY WITH PROPOFOL;  Surgeon: Regis Bill, MD;  Location: [facility] ENDOSCOPY;  Service: Endoscopy;  Laterality: N/A;   ECTOPIC PREGNANCY SURGERY  1980   HERNIA REPAIR  2006   Umbilical   KNEE ARTHROSCOPY Left 2006   torn meniscus   TONSILLECTOMY  1971   TOTAL ABDOMINAL HYSTERECTOMY W/  BILATERAL SALPINGOOPHORECTOMY  2006   VESICOVAGINAL FISTULA CLOSURE W/ TAH     Patient Active Problem List   Diagnosis Date Noted   Hx of spontaneous intraparenchymal intracranial hemorrhage 07/12/2021   Intraparenchymal hemorrhage of brain (HCC) 05/31/2021   ICH (intracerebral hemorrhage) (HCC) 05/27/2021   Morbid obesity (HCC) 09/24/2018   Unspecified inflammatory spondylopathy, lumbar region (HCC) 03/05/2018   Lumbar spondylolysis 08/20/2017   Lumbar facet arthropathy 08/20/2017   Lumbar degenerative disc disease 08/20/2017   Chronic constipation 07/30/2017   Primary osteoarthritis involving multiple joints 04/17/2017   Postmenopausal 10/12/2016   Prediabetes 10/12/2016   Lumbago 08/04/2015   Diverticulitis of colon 07/05/2014   Carotid artery disorder (HCC) 04/27/2014   Hyperlipidemia 10/08/2013   Hypertension, benign 11/19/2009    REFERRING DIAG: R spastic hemiplegia   THERAPY DIAG:  Other low back pain  Muscle weakness (generalized)  Difficulty in walking, not elsewhere classified  Unsteadiness on feet  Rationale for Evaluation and Treatment Rehabilitation  PERTINENT HISTORY: ALESANA BENT is a 71 y.o. right-handed female with history of hepatitis B, arthritis, diverticulitis, GERD, hyperlipidemia, hypertension. Patient is recently retired. She has a daughter and niece in the  area. Presented 05/27/2021 with sudden onset of right leg weakness and mild headache. Cranial CT scan showed acute parenchymal hemorrhage in the parasagittal left frontal parietal lobes with mild edema. No substantial mass effect. CT angiogram head and neck no abnormal vascularity in the region of hemorrhage no hemodynamically significant stenosis. MRI follow-up showed no mass effect seen underlying the left posterior frontal hematoma. Patient admitted to inpatient rehab on 05/31/21 and discharged on 06/27/21. Patient used home health at home that stopped two weeks ago. Patient has R foot  drop. Has a wheelchair she does household chores in, a RW, a quad cane and R AFO  PRECAUTIONS: fall, has latex allergy must use latex-free bands  SUBJECTIVE: Patient reports she had a massage on Saturday that helped her back pain.   PAIN:  Are you having pain? None  at rest.     TODAY'S TREATMENT:    There.ex: 6" step -toe taps 10x each side no UE support -step up/down 10x each LE -lateral step up/down 10x each LE  Standing heel raises 10x   Seated: Hamstring isometric 10x 3 second holds 5lb ankle weight LAQ 10x each LE ; cues for slow  control 5lb ankle weight march 12x each LE    Neuro Re-ed:  Standing with CGA next to support surface:  Airex pad: static stand 30 seconds x 2 trials, noticeable trembling of ankles/LE's with fatigue and challenge to maintain stability; second trial with eyes closed Airex pad: horizontal head turns 30 seconds scanning room 10x ; cueing for arc of motion  Airex pad: vertical head turns 30 seconds, cueing for arc of motion, noticeable sway with upward gaze increasing demand on ankle righting reaction musculature    PATIENT EDUCATION: Education details: Pt educated throughout session about proper posture and technique with exercises. Improved exercise technique, movement at target joints, use of target muscles after min to mod verbal, visual, tactile cues.  Person educated: Patient Education method: Explanation, Demonstration, and Tactile cues Education comprehension: verbalized understanding, returned demonstration, verbal cues required, and tactile cues required   HOME EXERCISE PROGRAM: Continue as previously given  Added df/pf  exercise to HEP -11/02/21   PT Short Term Goals -      PT SHORT TERM GOAL #1   Title Patient will be independent in home exercise program to improve strength/mobility for better functional independence with ADLs.    Baseline 5/4: HEP given 6/20: does HEP 2x a week 7/31: 3-4x a week   Time 4   Period  Weeks    Status Achieved    Target Date 01/05/22             PT Long Term Goals       PT LONG TERM GOAL #1   Title Patient will increase FOTO score to equal to or greater than 63%    to demonstrate statistically significant improvement in mobility and quality of life.    Baseline 5/4: 48%, 6/20: 60% , 7/31: 60%;9/15: 52%   Time 12   Period Weeks    Status Partially Met    Target Date 04/20/2022     PT LONG TERM GOAL #2   Title Patient will increase 10 meter walk test to >1.38m/s as to improve gait speed for better community ambulation and to reduce fall risk.    Baseline 5/4: 0.54 m/s with RW, 6/20: 0.63 m/s 7/31: 0.57 ; with SPC; 9/15: 0.42 m/s with SPC, *without AFO (waiting for new one)   Time 12   Period  Weeks    Status Partially Met    Target Date 04/20/2022     PT LONG TERM GOAL #3   Title Patient will demonstrate an improved Berg Balance Score of >45/56 as to demonstrate improved balance with ADLs such as sitting/standing and transfer balance and reduced fall risk.    Baseline 5/4: 32/56, 6/20: 45/56 , 7/31: 49/56; 9/15: 53/56   Time 8   Period Weeks    Status Achieved;   Target Date 02/05/22     PT LONG TERM GOAL #4   Title Patient will increase BLE gross strength to 4+/5 as to improve functional strength for independent gait, increased standing tolerance and increased ADL ability.    Baseline 5/4: see note, 6/20:R knee 4/5 , 7/31: not formally assessed; 9/15: BLE strength is grossly 4/5    Time 12   Period Weeks    Status Partially Met    Target Date 04/20/2022   PT LONG TERM GOAL #5  Title  Patient will increase ABC scale score >80% to demonstrate better functional mobility and better confidence with ADLs.   Baseline 9/15:  Time 12  Period Weeks   Status NEW  Target Date 04/20/2022              Plan     Clinical Impression Statement Patient presents with excellent motivation. She is able to tolerate progressive strengthening as well as sitting and  standing from chair without UE support.  Cues for reduction of hyperextension of knee required with seated LAQs this session.The pt will continue to benefit from skilled PT services to progress mobility, reduce risk of falls, and address and reduce LBP which is currently impacting her mobility and progress toward goals.    Personal Factors and Comorbidities Comorbidity 3+;Fitness;Past/Current Experience;Time since onset of injury/illness/exacerbation;Transportation    Comorbidities hepatitis B, arthritis, diverticulitis, GERD, hyperlipidemia, hypertension    Examination-Activity Limitations Bed Mobility;Caring for [employer];Locomotion Level;Lift;Squat;Stairs;Stand;Toileting;Transfers    Examination-Participation Restrictions Cleaning;Driving;Meal Prep;Laundry;Shop;Volunteer;Yard Work    Conservation officer, historic buildings Evolving/Moderate complexity    Rehab Potential Fair    PT Frequency 1x / week    PT Duration 12 weeks    PT Treatment/Interventions ADLs/Self Care Home Management;Canalith Repostioning;Cryotherapy;Electrical Stimulation;Iontophoresis 4mg /ml Dexamethasone;Moist Heat;Traction;Ultrasound;Functional mobility training;Stair training;Gait training;DME Instruction;Therapeutic activities;Therapeutic exercise;Balance training;Neuromuscular re-education;Manual techniques;Orthotic Fit/Training;Patient/family education;Passive range of motion;Dry needling;Taping;Splinting;Energy conservation;Visual/perceptual remediation/compensation;Vestibular    PT Next Visit Plan progress stabilization and strength    PT Home Exercise Plan see above    Consulted and Agree with Plan of Care Patient             Precious Bard PT  11:00 AM, 02/05/22  Physical Therapist - [facility]  Outpatient Physical Therapy- Main Campus [phone]     Physical Therapist- [facility]  02/05/2022, 11:00 AM

## 2022-02-02 ENCOUNTER — Telehealth: Payer: Self-pay

## 2022-02-02 NOTE — Telephone Encounter (Signed)
I spoke with Terri Werner. We have discussed possible surgery dates and Monday October 9th, 2023 was agreed upon by all parties. Patient given information about surgery date, what to expect pre-operatively and post operatively.  We discussed that a Pre-Admission Testing office will be calling to set up the pre-op visit that will take place prior to surgery, and that these appointments are typically done over the phone with a Pre-Admissions RN.  Informed patient that our office will communicate any additional care to be provided after surgery. Patients questions or concerns were discussed during our call. Advised to call our office should there be any additional information, questions or concerns that arise. Patient verbalized understanding.

## 2022-02-02 NOTE — Progress Notes (Signed)
[facility] Posting Form   Surgery Date/Time: Date: 02/19/2022  Surgeon: Dr. Hollice Espy, MD  Surgery Location: Day Surgery  Inpt ( No  )   Outpt (Yes)   Obs ( No  )   Diagnosis: Colovesical Fistula N32.1  -CPT: 99718  Surgery: Cystoscopy with Bladder Biopsy  Stop Anticoagulations: Yes, may continue ASA  Cardiac/Medical/Pulmonary Clearance needed: no  *Orders entered into EPIC  Date: 02/02/22   *Case booked in EPIC  Date: 02/02/22  *Notified pt of Surgery: Date: 02/02/22  PRE-OP UA & CX: no  *Placed into Prior Authorization Work [place] Date: 02/02/22   Assistant/laser/rep:No

## 2022-02-05 ENCOUNTER — Ambulatory Visit: Payer: Medicare Other

## 2022-02-05 ENCOUNTER — Ambulatory Visit: Payer: Medicare Other | Admitting: Physical Therapy

## 2022-02-05 DIAGNOSIS — R278 Other lack of coordination: Secondary | ICD-10-CM | POA: Diagnosis not present

## 2022-02-05 DIAGNOSIS — R2681 Unsteadiness on feet: Secondary | ICD-10-CM

## 2022-02-05 DIAGNOSIS — M6281 Muscle weakness (generalized): Secondary | ICD-10-CM | POA: Diagnosis not present

## 2022-02-05 DIAGNOSIS — M5459 Other low back pain: Secondary | ICD-10-CM

## 2022-02-05 DIAGNOSIS — R262 Difficulty in walking, not elsewhere classified: Secondary | ICD-10-CM | POA: Diagnosis not present

## 2022-02-05 DIAGNOSIS — I619 Nontraumatic intracerebral hemorrhage, unspecified: Secondary | ICD-10-CM | POA: Diagnosis not present

## 2022-02-05 DIAGNOSIS — R269 Unspecified abnormalities of gait and mobility: Secondary | ICD-10-CM | POA: Diagnosis not present

## 2022-02-06 NOTE — Therapy (Signed)
OUTPATIENT PHYSICAL THERAPY TREATMENT NOTE     Patient Name: Terri Werner MRN: [id number] DOB:05/17/1950, 71 y.o., female Today's Date: 02/07/2022  PCP: Margarita Mail DO  REFERRING PROVIDER: Erick Colace MD    PT End of Session - 02/07/22 1442     Visit Number 34    Number of Visits 42    Date for PT Re-Evaluation 04/20/22    Authorization Type BCBS Medicare    Authorization Time Period 01/26/22-04/20/22    Progress Note Due on Visit 50    PT Start Time 1444    PT Stop Time 1529    PT Time Calculation (min) 45 min    Equipment Utilized During Treatment Gait belt    Activity Tolerance Patient tolerated treatment well    Behavior During Therapy WFL for tasks assessed/performed                          Past Medical History:  Diagnosis Date   Arthritis    pt reports in back & knees   Colon polyps 2010   At [facility]   Diverticulitis 2016   Genital warts    GERD (gastroesophageal reflux disease)    Heart murmur    Hepatitis B    History of blood transfusion    during each major surgery per pt   History of chicken pox    History of hiatal hernia    History of torn meniscus of right knee    HTN (hypertension)    Hx of migraine headaches    Hypertension    Hypopotassemia    Past Surgical History:  Procedure Laterality Date   ABDOMINAL HYSTERECTOMY  2006   APPENDECTOMY  1980   CESAREAN SECTION  1981   COLONOSCOPY WITH PROPOFOL N/A 11/01/2014   Procedure: COLONOSCOPY WITH PROPOFOL;  Surgeon: Scot Jun, MD;  Location: [facility] ENDOSCOPY;  Service: Endoscopy;  Laterality: N/A;   COLONOSCOPY WITH PROPOFOL N/A 01/05/2020   Procedure: COLONOSCOPY WITH PROPOFOL;  Surgeon: Regis Bill, MD;  Location: [facility] ENDOSCOPY;  Service: Endoscopy;  Laterality: N/A;   ECTOPIC PREGNANCY SURGERY  1980   HERNIA REPAIR  2006   Umbilical   KNEE ARTHROSCOPY Left 2006   torn meniscus   TONSILLECTOMY  1971   TOTAL ABDOMINAL HYSTERECTOMY W/  BILATERAL SALPINGOOPHORECTOMY  2006   VESICOVAGINAL FISTULA CLOSURE W/ TAH     Patient Active Problem List   Diagnosis Date Noted   Hx of spontaneous intraparenchymal intracranial hemorrhage 07/12/2021   Intraparenchymal hemorrhage of brain (HCC) 05/31/2021   ICH (intracerebral hemorrhage) (HCC) 05/27/2021   Morbid obesity (HCC) 09/24/2018   Unspecified inflammatory spondylopathy, lumbar region (HCC) 03/05/2018   Lumbar spondylolysis 08/20/2017   Lumbar facet arthropathy 08/20/2017   Lumbar degenerative disc disease 08/20/2017   Chronic constipation 07/30/2017   Primary osteoarthritis involving multiple joints 04/17/2017   Postmenopausal 10/12/2016   Prediabetes 10/12/2016   Lumbago 08/04/2015   Diverticulitis of colon 07/05/2014   Carotid artery disorder (HCC) 04/27/2014   Hyperlipidemia 10/08/2013   Hypertension, benign 11/19/2009    REFERRING DIAG: R spastic hemiplegia   THERAPY DIAG:  Other low back pain  Muscle weakness (generalized)  Difficulty in walking, not elsewhere classified  Unsteadiness on feet  Rationale for Evaluation and Treatment Rehabilitation  PERTINENT HISTORY: [name] is a 71 y.o. right-handed female with history of hepatitis B, arthritis, diverticulitis, GERD, hyperlipidemia, hypertension. Patient is recently retired. She has a daughter and niece in  the area. Presented 05/27/2021 with sudden onset of right leg weakness and mild headache. Cranial CT scan showed acute parenchymal hemorrhage in the parasagittal left frontal parietal lobes with mild edema. No substantial mass effect. CT angiogram head and neck no abnormal vascularity in the region of hemorrhage no hemodynamically significant stenosis. MRI follow-up showed no mass effect seen underlying the left posterior frontal hematoma. Patient admitted to inpatient rehab on 05/31/21 and discharged on 06/27/21. Patient used home health at home that stopped two weeks ago. Patient has R foot  drop. Has a wheelchair she does household chores in, a RW, a quad cane and R AFO  PRECAUTIONS: fall, has latex allergy must use latex-free bands  SUBJECTIVE: Patient reports no aches or pains. No falls or LOB since last session.   PAIN:  Are you having pain? None  at rest.     TODAY'S TREATMENT:    There.ex: Nustep lvl 4 seat position 8 ; cues for body mechanics, keeping RPM> 60, and sequencing for cardiovascular challenge.   Standing with # 3 ankle weight: CGA for stability  -Hip extension with B upper extremity support, cueing for neutral hip alignment, upright posture for optimal muscle recruitment, and sequencing, 15x each LE,  -Hip abduction with B upper extremity support, cueing for neutral foot alignment for correct muscle activation, 15x each LE -Hip flexion with B upper extremity support, cueing for body mechanics, speed of muscle recruitment for optimal strengthening and stabilization 15x each LE -Hamstring curl with B upper extremity support, cueing for knee alignment for recruitment of hamstring musculature, 15x each LE   Seated with # 3 ankle weights  -Seated marches with upright posture, back away from back of chair for abdominal/trunk activation/stabilization, 15x each LE -Seated LAQ with 3 second holds, 15x each LE, cueing for muscle activation and sequencing for neutral alignment -Seated Iheel raise, 15x each LE    Neuro Re-ed:  Weave between 7 cones with SPC x2 trials SLS 30 seconds each LE    PATIENT EDUCATION: Education details: Pt educated throughout session about proper posture and technique with exercises. Improved exercise technique, movement at target joints, use of target muscles after min to mod verbal, visual, tactile cues.  Person educated: Patient Education method: Explanation, Demonstration, and Tactile cues Education comprehension: verbalized understanding, returned demonstration, verbal cues required, and tactile cues required   HOME  EXERCISE PROGRAM: Continue as previously given  Added df/pf  exercise to HEP -11/02/21   PT Short Term Goals -      PT SHORT TERM GOAL #1   Title Patient will be independent in home exercise program to improve strength/mobility for better functional independence with ADLs.    Baseline 5/4: HEP given 6/20: does HEP 2x a week 7/31: 3-4x a week   Time 4   Period Weeks    Status Achieved    Target Date 01/05/22             PT Long Term Goals       PT LONG TERM GOAL #1   Title Patient will increase FOTO score to equal to or greater than 63%    to demonstrate statistically significant improvement in mobility and quality of life.    Baseline 5/4: 48%, 6/20: 60% , 7/31: 60%;9/15: 52%   Time 12   Period Weeks    Status Partially Met    Target Date 04/20/2022     PT LONG TERM GOAL #2   Title Patient will increase 10 meter walk test  to >1.68m/s as to improve gait speed for better community ambulation and to reduce fall risk.    Baseline 5/4: 0.54 m/s with RW, 6/20: 0.63 m/s 7/31: 0.57 ; with SPC; 9/15: 0.42 m/s with SPC, *without AFO (waiting for new one)   Time 12   Period Weeks    Status Partially Met    Target Date 04/20/2022     PT LONG TERM GOAL #3   Title Patient will demonstrate an improved Berg Balance Score of >45/56 as to demonstrate improved balance with ADLs such as sitting/standing and transfer balance and reduced fall risk.    Baseline 5/4: 32/56, 6/20: 45/56 , 7/31: 49/56; 9/15: 53/56   Time 8   Period Weeks    Status Achieved;   Target Date 02/05/22     PT LONG TERM GOAL #4   Title Patient will increase BLE gross strength to 4+/5 as to improve functional strength for independent gait, increased standing tolerance and increased ADL ability.    Baseline 5/4: see note, 6/20:R knee 4/5 , 7/31: not formally assessed; 9/15: BLE strength is grossly 4/5    Time 12   Period Weeks    Status Partially Met    Target Date 04/20/2022   PT LONG TERM GOAL #5  Title  Patient  will increase ABC scale score >80% to demonstrate better functional mobility and better confidence with ADLs.   Baseline 9/15:  Time 12  Period Weeks   Status NEW  Target Date 04/20/2022              Plan     Clinical Impression Statement Patient presents with excellent motivation. Has minimal back pain allowing for progression of strengthening interventions. .The pt will continue to benefit from skilled PT services to progress mobility, reduce risk of falls, and address and reduce LBP which is currently impacting her mobility and progress toward goals.    Personal Factors and Comorbidities Comorbidity 3+;Fitness;Past/Current Experience;Time since onset of injury/illness/exacerbation;Transportation    Comorbidities hepatitis B, arthritis, diverticulitis, GERD, hyperlipidemia, hypertension    Examination-Activity Limitations Bed Mobility;Caring for [employer];Locomotion Level;Lift;Squat;Stairs;Stand;Toileting;Transfers    Examination-Participation Restrictions Cleaning;Driving;Meal Prep;Laundry;Shop;Volunteer;Yard Work    Conservation officer, historic buildings Evolving/Moderate complexity    Rehab Potential Fair    PT Frequency 1x / week    PT Duration 12 weeks    PT Treatment/Interventions ADLs/Self Care Home Management;Canalith Repostioning;Cryotherapy;Electrical Stimulation;Iontophoresis 4mg /ml Dexamethasone;Moist Heat;Traction;Ultrasound;Functional mobility training;Stair training;Gait training;DME Instruction;Therapeutic activities;Therapeutic exercise;Balance training;Neuromuscular re-education;Manual techniques;Orthotic Fit/Training;Patient/family education;Passive range of motion;Dry needling;Taping;Splinting;Energy conservation;Visual/perceptual remediation/compensation;Vestibular    PT Next Visit Plan progress stabilization and strength    PT Home Exercise Plan see above    Consulted and Agree with Plan of Care Patient             Precious Bard  PT  3:41 PM, 02/07/22  Physical Therapist - [facility]  Outpatient Physical Therapy- Main Campus [phone]

## 2022-02-07 ENCOUNTER — Telehealth: Payer: Self-pay

## 2022-02-07 ENCOUNTER — Ambulatory Visit: Payer: Medicare Other

## 2022-02-07 ENCOUNTER — Ambulatory Visit: Payer: Medicare Other | Admitting: Physical Therapy

## 2022-02-07 DIAGNOSIS — R262 Difficulty in walking, not elsewhere classified: Secondary | ICD-10-CM

## 2022-02-07 DIAGNOSIS — M5459 Other low back pain: Secondary | ICD-10-CM

## 2022-02-07 DIAGNOSIS — R269 Unspecified abnormalities of gait and mobility: Secondary | ICD-10-CM | POA: Diagnosis not present

## 2022-02-07 DIAGNOSIS — M6281 Muscle weakness (generalized): Secondary | ICD-10-CM

## 2022-02-07 DIAGNOSIS — R2681 Unsteadiness on feet: Secondary | ICD-10-CM | POA: Diagnosis not present

## 2022-02-07 DIAGNOSIS — I619 Nontraumatic intracerebral hemorrhage, unspecified: Secondary | ICD-10-CM | POA: Diagnosis not present

## 2022-02-07 DIAGNOSIS — R278 Other lack of coordination: Secondary | ICD-10-CM | POA: Diagnosis not present

## 2022-02-07 LAB — CULTURE, URINE COMPREHENSIVE

## 2022-02-07 MED ORDER — SULFAMETHOXAZOLE-TRIMETHOPRIM 800-160 MG PO TABS: 1.0000 | ORAL_TABLET | Freq: Two times a day (BID) | ORAL | 0 refills | Status: DC

## 2022-02-07 NOTE — Telephone Encounter (Signed)
-----   Message from Hollice Espy, MD sent at 02/07/2022 12:10 PM EDT ----- Preprocedure urine growing a fairly low colony count of E. coli likely related to fistula.  Would like her to start taking Bactrim twice a day for 3 days before the procedure for total of 7 days.  We will help reduce the risk of infectious complications.  Hollice Espy, MD

## 2022-02-07 NOTE — Telephone Encounter (Signed)
Informed patient of results.  Bactrim rx sent to pharmacy.  Patient aware of directions.

## 2022-02-09 ENCOUNTER — Encounter
Admission: RE | Admit: 2022-02-09 | Discharge: 2022-02-09 | Disposition: A | Discharge status: Home / Self Care | Payer: Medicare Other | Source: Ambulatory Visit | Attending: Urology | Admitting: Urology

## 2022-02-09 DIAGNOSIS — I10 Essential (primary) hypertension: Secondary | ICD-10-CM

## 2022-02-09 DIAGNOSIS — I779 Disorder of arteries and arterioles, unspecified: Secondary | ICD-10-CM

## 2022-02-09 DIAGNOSIS — R7303 Prediabetes: Secondary | ICD-10-CM

## 2022-02-09 HISTORY — DX: Prediabetes: R73.03

## 2022-02-09 NOTE — Patient Instructions (Addendum)
Your procedure is scheduled on: Monday February 19, 2022. Report to Day Surgery inside [place] 2nd floor, stop by registration desk before getting on elevator.  To find out your arrival time please call [phone] between 1PM - 3PM on Friday February 16, 2022.  Remember: Instructions that are not followed completely may result in serious medical risk,  up to and including death, or upon the discretion of your surgeon and anesthesiologist your  surgery may need to be rescheduled.     _X__ 1. Do not eat food or drink fluids after midnight the night before your procedure.                 No chewing gum or hard candies.  __X__2.  On the morning of surgery brush your teeth with toothpaste and water, you                may rinse your mouth with mouthwash if you wish.  Do not swallow any toothpaste or mouthwash.     _X__ 3.  No Alcohol for 24 hours before or after surgery.   _X__ 4.  Do Not Smoke or use e-cigarettes For 24 Hours Prior to Your Surgery.                 Do not use any chewable tobacco products for at least 6 hours prior to                 Surgery.  _X__  5.  Do not use any recreational drugs (marijuana, cocaine, heroin, ecstasy, MDMA or other)                For at least one week prior to your surgery.  Combination of these drugs with anesthesia                May have life threatening results.  ____  6.  Bring all medications with you on the day of surgery if instructed.   __X__7.  Notify your doctor if there is any change in your medical condition      (cold, fever, infections).     Do not wear jewelry, make-up, hairpins, clips or nail polish. Do not wear lotions, powders, or perfumes. You may wear deodorant. Do not shave 48 hours prior to surgery. Men may shave face and neck. Do not bring valuables to the hospital.    [facility] is not responsible for any belongings or valuables.  Contacts, dentures or bridgework may not be worn into  surgery. Leave your suitcase in the car. After surgery it may be brought to your room. For patients admitted to the hospital, discharge time is determined by your treatment team.   Patients discharged the day of surgery will not be allowed to drive home.   Make arrangements for someone to be with you for the first 24 hours of your Same Day Discharge.   __X__ Take these medicines the morning of surgery with A SIP OF WATER:    1. amLODipine (NORVASC) 5 MG   2.   3.   4.  5.  6.  ____ Fleet Enema (as directed)   ____ Use CHG Soap (or wipes) as directed  ____ Use Benzoyl Peroxide Gel as instructed  ____ Use inhalers on the day of surgery  ____ Stop metformin 2 days prior to surgery    ____ Take 1/2 of usual insulin dose the night before surgery. No insulin the morning  of surgery.   ____ Call your PCP, cardiologist, or Pulmonologist if taking Coumadin/Plavix/aspirin and ask when to stop before your surgery.   __X__ One Week prior to surgery- Stop Anti-inflammatories such as Ibuprofen, Aleve, Advil, Motrin, meloxicam (MOBIC), diclofenac, etodolac, ketorolac, Toradol, Daypro, piroxicam, Goody's or BC powders. OK TO USE TYLENOL IF NEEDED   __X__ Do not start any new vitamins and or supplements until after surgery.    ____ Bring C-Pap to the hospital.    If you have any questions regarding your pre-procedure instructions,  Please call Pre-admit Testing at [phone]

## 2022-02-12 ENCOUNTER — Other Ambulatory Visit: Payer: Medicare Other

## 2022-02-12 ENCOUNTER — Ambulatory Visit: Payer: Medicare Other | Attending: Physical Medicine & Rehabilitation

## 2022-02-12 ENCOUNTER — Ambulatory Visit: Payer: Medicare Other | Admitting: Physical Therapy

## 2022-02-12 DIAGNOSIS — R2681 Unsteadiness on feet: Secondary | ICD-10-CM | POA: Diagnosis not present

## 2022-02-12 DIAGNOSIS — R262 Difficulty in walking, not elsewhere classified: Secondary | ICD-10-CM | POA: Insufficient documentation

## 2022-02-12 DIAGNOSIS — M6281 Muscle weakness (generalized): Secondary | ICD-10-CM | POA: Diagnosis not present

## 2022-02-12 DIAGNOSIS — M5459 Other low back pain: Secondary | ICD-10-CM | POA: Insufficient documentation

## 2022-02-12 DIAGNOSIS — R278 Other lack of coordination: Secondary | ICD-10-CM | POA: Insufficient documentation

## 2022-02-12 DIAGNOSIS — R269 Unspecified abnormalities of gait and mobility: Secondary | ICD-10-CM | POA: Diagnosis not present

## 2022-02-12 NOTE — Therapy (Signed)
OUTPATIENT PHYSICAL THERAPY TREATMENT NOTE     Patient Name: Terri Werner MRN: [id number] DOB:[birth year], 71 y.o., female Today's Date: 02/12/2022  PCP: Margarita Mail DO  REFERRING PROVIDER: Erick Colace MD    PT End of Session - 02/12/22 1431     Visit Number 35    Number of Visits 42    Date for PT Re-Evaluation 04/20/22    Authorization Type BCBS Medicare    Authorization Time Period 01/26/22-04/20/22    Progress Note Due on Visit 50    PT Start Time 1430    PT Stop Time 1514    PT Time Calculation (min) 44 min    Equipment Utilized During Treatment Gait belt    Activity Tolerance Patient tolerated treatment well    Behavior During Therapy WFL for tasks assessed/performed                           Past Medical History:  Diagnosis Date   Arthritis    pt reports in back & knees   Colon polyps 2010   At [facility]   Diverticulitis 2016   Genital warts    GERD (gastroesophageal reflux disease)    Heart murmur    Hepatitis B    History of blood transfusion    during each major surgery per pt   History of chicken pox    History of hiatal hernia    History of torn meniscus of right knee    HTN (hypertension)    Hx of migraine headaches    Hypertension    Hypopotassemia    Pre-diabetes    Stroke (HCC) 05/27/2021   Past Surgical History:  Procedure Laterality Date   ABDOMINAL HYSTERECTOMY  2006   APPENDECTOMY  1980   CESAREAN SECTION  1981   COLONOSCOPY WITH PROPOFOL N/A 11/01/2014   Procedure: COLONOSCOPY WITH PROPOFOL;  Surgeon: Scot Jun, MD;  Location: [facility] ENDOSCOPY;  Service: Endoscopy;  Laterality: N/A;   COLONOSCOPY WITH PROPOFOL N/A 01/05/2020   Procedure: COLONOSCOPY WITH PROPOFOL;  Surgeon: Regis Bill, MD;  Location: [facility] ENDOSCOPY;  Service: Endoscopy;  Laterality: N/A;   ECTOPIC PREGNANCY SURGERY  1980   HERNIA REPAIR  2006   Umbilical   KNEE ARTHROSCOPY Left 2006   torn meniscus   TONSILLECTOMY   1971   TOTAL ABDOMINAL HYSTERECTOMY W/ BILATERAL SALPINGOOPHORECTOMY  2006   VESICOVAGINAL FISTULA CLOSURE W/ TAH     Patient Active Problem List   Diagnosis Date Noted   Hx of spontaneous intraparenchymal intracranial hemorrhage 07/12/2021   Intraparenchymal hemorrhage of brain (HCC) 05/31/2021   ICH (intracerebral hemorrhage) (HCC) 05/27/2021   Morbid obesity (HCC) 09/24/2018   Unspecified inflammatory spondylopathy, lumbar region (HCC) 03/05/2018   Lumbar spondylolysis 08/20/2017   Lumbar facet arthropathy 08/20/2017   Lumbar degenerative disc disease 08/20/2017   Chronic constipation 07/30/2017   Primary osteoarthritis involving multiple joints 04/17/2017   Postmenopausal 10/12/2016   Prediabetes 10/12/2016   Lumbago 08/04/2015   Diverticulitis of colon 07/05/2014   Carotid artery disorder (HCC) 04/27/2014   Hyperlipidemia 10/08/2013   Hypertension, benign 11/19/2009    REFERRING DIAG: R spastic hemiplegia   THERAPY DIAG:  Other low back pain  Muscle weakness (generalized)  Difficulty in walking, not elsewhere classified  Unsteadiness on feet  Rationale for Evaluation and Treatment Rehabilitation  PERTINENT HISTORY: Terri Werner is a 71 y.o. right-handed female with history of hepatitis B, arthritis, diverticulitis, GERD, hyperlipidemia, hypertension. Patient  is recently retired. She has a daughter and niece in the area. Presented 05/27/2021 with sudden onset of right leg weakness and mild headache. Cranial CT scan showed acute parenchymal hemorrhage in the parasagittal left frontal parietal lobes with mild edema. No substantial mass effect. CT angiogram head and neck no abnormal vascularity in the region of hemorrhage no hemodynamically significant stenosis. MRI follow-up showed no mass effect seen underlying the left posterior frontal hematoma. Patient admitted to inpatient rehab on 05/31/21 and discharged on 06/27/21. Patient used home health at home that  stopped two weeks ago. Patient has R foot drop. Has a wheelchair she does household chores in, a RW, a quad cane and R AFO  PRECAUTIONS: fall, has latex allergy must use latex-free bands  SUBJECTIVE:Patient reports no falls or LOB since last session. Went to church yesterday.   PAIN:  Are you having pain? None  at rest.     TODAY'S TREATMENT:    There.ex:   Standing with # 5 ankle weight: CGA for stability  -Hip extension with B upper extremity support, cueing for neutral hip alignment, upright posture for optimal muscle recruitment, and sequencing, 15x each LE,  -Hip abduction with B upper extremity support, cueing for neutral foot alignment for correct muscle activation, 15x each LE -Hip flexion with B upper extremity support, cueing for body mechanics, speed of muscle recruitment for optimal strengthening and stabilization 15x each LE -Hamstring curl with B upper extremity support, cueing for knee alignment for recruitment of hamstring musculature, 15x each LE   Seated with # 5 ankle weights  -Seated marches with upright posture, back away from back of chair for abdominal/trunk activation/stabilization, 15x each LE -Seated LAQ with 3 second holds, 15x each LE, cueing for muscle activation and sequencing for neutral alignment -seated step out 10x each LE  Seated heel raise, 15x each LE Soccer ball hamstring curl 10x each LE     Neuro Re-ed:  Weave between obstacle course of PT making. X4 trials  Airex pad; reach for letters and place in alphabetical order x4 minutes     PATIENT EDUCATION: Education details: Pt educated throughout session about proper posture and technique with exercises. Improved exercise technique, movement at target joints, use of target muscles after min to mod verbal, visual, tactile cues.  Person educated: Patient Education method: Explanation, Demonstration, and Tactile cues Education comprehension: verbalized understanding, returned  demonstration, verbal cues required, and tactile cues required   HOME EXERCISE PROGRAM: Continue as previously given  Added df/pf  exercise to HEP -11/02/21   PT Short Term Goals -      PT SHORT TERM GOAL #1   Title Patient will be independent in home exercise program to improve strength/mobility for better functional independence with ADLs.    Baseline 5/4: HEP given 6/20: does HEP 2x a week 7/31: 3-4x a week   Time 4   Period Weeks    Status Achieved    Target Date 01/05/22             PT Long Term Goals       PT LONG TERM GOAL #1   Title Patient will increase FOTO score to equal to or greater than 63%    to demonstrate statistically significant improvement in mobility and quality of life.    Baseline 5/4: 48%, 6/20: 60% , 7/31: 60%;9/15: 52%   Time 12   Period Weeks    Status Partially Met    Target Date 04/20/2022  PT LONG TERM GOAL #2   Title Patient will increase 10 meter walk test to >1.59m/s as to improve gait speed for better community ambulation and to reduce fall risk.    Baseline 5/4: 0.54 m/s with RW, 6/20: 0.63 m/s 7/31: 0.57 ; with SPC; 9/15: 0.42 m/s with SPC, *without AFO (waiting for new one)   Time 12   Period Weeks    Status Partially Met    Target Date 04/20/2022     PT LONG TERM GOAL #3   Title Patient will demonstrate an improved Berg Balance Score of >45/56 as to demonstrate improved balance with ADLs such as sitting/standing and transfer balance and reduced fall risk.    Baseline 5/4: 32/56, 6/20: 45/56 , 7/31: 49/56; 9/15: 53/56   Time 8   Period Weeks    Status Achieved;   Target Date 02/05/22     PT LONG TERM GOAL #4   Title Patient will increase BLE gross strength to 4+/5 as to improve functional strength for independent gait, increased standing tolerance and increased ADL ability.    Baseline 5/4: see note, 6/20:R knee 4/5 , 7/31: not formally assessed; 9/15: BLE strength is grossly 4/5    Time 12   Period Weeks    Status Partially  Met    Target Date 04/20/2022   PT LONG TERM GOAL #5  Title  Patient will increase ABC scale score >80% to demonstrate better functional mobility and better confidence with ADLs.   Baseline 9/15:  Time 12  Period Weeks   Status NEW  Target Date 04/20/2022              Plan     Clinical Impression Statement Patient tolerates increased weights for LE strengthening. Coordination of use of cane with stepping over and around obstacles performed with cane raised to a more appropriate height.The pt will continue to benefit from skilled PT services to progress mobility, reduce risk of falls, and address and reduce LBP which is currently impacting her mobility and progress toward goals.    Personal Factors and Comorbidities Comorbidity 3+;Fitness;Past/Current Experience;Time since onset of injury/illness/exacerbation;Transportation    Comorbidities hepatitis B, arthritis, diverticulitis, GERD, hyperlipidemia, hypertension    Examination-Activity Limitations Bed Mobility;Caring for [employer];Locomotion Level;Lift;Squat;Stairs;Stand;Toileting;Transfers    Examination-Participation Restrictions Cleaning;Driving;Meal Prep;Laundry;Shop;Volunteer;Yard Work    Conservation officer, historic buildings Evolving/Moderate complexity    Rehab Potential Fair    PT Frequency 1x / week    PT Duration 12 weeks    PT Treatment/Interventions ADLs/Self Care Home Management;Canalith Repostioning;Cryotherapy;Electrical Stimulation;Iontophoresis 4mg /ml Dexamethasone;Moist Heat;Traction;Ultrasound;Functional mobility training;Stair training;Gait training;DME Instruction;Therapeutic activities;Therapeutic exercise;Balance training;Neuromuscular re-education;Manual techniques;Orthotic Fit/Training;Patient/family education;Passive range of motion;Dry needling;Taping;Splinting;Energy conservation;Visual/perceptual remediation/compensation;Vestibular    PT Next Visit Plan progress stabilization and  strength    PT Home Exercise Plan see above    Consulted and Agree with Plan of Care Patient             Precious Bard PT  3:16 PM, 02/12/22  Physical Therapist - [facility]  Outpatient Physical Therapy- Main Campus [phone]

## 2022-02-13 ENCOUNTER — Encounter
Admission: RE | Admit: 2022-02-13 | Discharge: 2022-02-13 | Disposition: A | Discharge status: Home / Self Care | Payer: Medicare Other | Source: Ambulatory Visit | Attending: Urology | Admitting: Urology

## 2022-02-13 DIAGNOSIS — R7303 Prediabetes: Secondary | ICD-10-CM | POA: Insufficient documentation

## 2022-02-13 DIAGNOSIS — Z01818 Encounter for other preprocedural examination: Secondary | ICD-10-CM | POA: Insufficient documentation

## 2022-02-13 DIAGNOSIS — Z01810 Encounter for preprocedural cardiovascular examination: Secondary | ICD-10-CM | POA: Diagnosis not present

## 2022-02-13 DIAGNOSIS — I779 Disorder of arteries and arterioles, unspecified: Secondary | ICD-10-CM | POA: Diagnosis not present

## 2022-02-13 DIAGNOSIS — I10 Essential (primary) hypertension: Secondary | ICD-10-CM | POA: Insufficient documentation

## 2022-02-13 LAB — CBC
HCT: 36.8 % (ref 36.0–46.0)
Hemoglobin: 11.9 g/dL — ABNORMAL LOW (ref 12.0–15.0)
MCH: 28.7 pg (ref 26.0–34.0)
MCHC: 32.3 g/dL (ref 30.0–36.0)
MCV: 88.9 fL (ref 80.0–100.0)
Platelets: 204 10*3/uL (ref 150–400)
RBC: 4.14 MIL/uL (ref 3.87–5.11)
RDW: 15.6 % — ABNORMAL HIGH (ref 11.5–15.5)
WBC: 5.5 10*3/uL (ref 4.0–10.5)
nRBC: 0 % (ref 0.0–0.2)

## 2022-02-13 LAB — BASIC METABOLIC PANEL
Anion gap: 7 (ref 5–15)
BUN: 17 mg/dL (ref 8–23)
CO2: 29 mmol/L (ref 22–32)
Calcium: 9 mg/dL (ref 8.9–10.3)
Chloride: 105 mmol/L (ref 98–111)
Creatinine, Ser: 1.03 mg/dL — ABNORMAL HIGH (ref 0.44–1.00)
GFR, Estimated: 58 mL/min — ABNORMAL LOW (ref >60)
Glucose, Bld: 109 mg/dL — ABNORMAL HIGH (ref 70–99)
Potassium: 3.7 mmol/L (ref 3.5–5.1)
Sodium: 141 mmol/L (ref 135–145)

## 2022-02-14 ENCOUNTER — Ambulatory Visit: Payer: Medicare Other | Admitting: Physical Therapy

## 2022-02-14 ENCOUNTER — Ambulatory Visit: Payer: Medicare Other

## 2022-02-14 DIAGNOSIS — S76011A Strain of muscle, fascia and tendon of right hip, initial encounter: Secondary | ICD-10-CM | POA: Diagnosis not present

## 2022-02-14 DIAGNOSIS — R2681 Unsteadiness on feet: Secondary | ICD-10-CM

## 2022-02-14 DIAGNOSIS — M6281 Muscle weakness (generalized): Secondary | ICD-10-CM | POA: Diagnosis not present

## 2022-02-14 DIAGNOSIS — M5459 Other low back pain: Secondary | ICD-10-CM | POA: Diagnosis not present

## 2022-02-14 DIAGNOSIS — R278 Other lack of coordination: Secondary | ICD-10-CM

## 2022-02-14 DIAGNOSIS — R269 Unspecified abnormalities of gait and mobility: Secondary | ICD-10-CM | POA: Diagnosis not present

## 2022-02-14 DIAGNOSIS — M25551 Pain in right hip: Secondary | ICD-10-CM | POA: Diagnosis not present

## 2022-02-14 DIAGNOSIS — R262 Difficulty in walking, not elsewhere classified: Secondary | ICD-10-CM | POA: Diagnosis not present

## 2022-02-14 NOTE — Therapy (Signed)
OUTPATIENT PHYSICAL THERAPY TREATMENT NOTE     Patient Name: Terri Werner MRN: [id number] DOB:1951/04/25, 71 y.o., female Today's Date: 02/14/2022  PCP: Margarita Mail DO  REFERRING PROVIDER: Erick Colace MD    PT End of Session - 02/14/22 1656     Visit Number 36    Number of Visits 42    Date for PT Re-Evaluation 04/20/22    Authorization Type BCBS Medicare    Authorization Time Period 01/26/22-04/20/22    Progress Note Due on Visit 50    PT Start Time 1432    PT Stop Time 1514    PT Time Calculation (min) 42 min    Equipment Utilized During Treatment Gait belt    Activity Tolerance Patient tolerated treatment well    Behavior During Therapy WFL for tasks assessed/performed                            Past Medical History:  Diagnosis Date   Arthritis    pt reports in back & knees   Colon polyps 2010   At [facility]   Diverticulitis 2016   Genital warts    GERD (gastroesophageal reflux disease)    Heart murmur    Hepatitis B    History of blood transfusion    during each major surgery per pt   History of chicken pox    History of hiatal hernia    History of torn meniscus of right knee    HTN (hypertension)    Hx of migraine headaches    Hypertension    Hypopotassemia    Pre-diabetes    Stroke (HCC) 05/27/2021   Past Surgical History:  Procedure Laterality Date   ABDOMINAL HYSTERECTOMY  2006   APPENDECTOMY  1980   CESAREAN SECTION  1981   COLONOSCOPY WITH PROPOFOL N/A 11/01/2014   Procedure: COLONOSCOPY WITH PROPOFOL;  Surgeon: Scot Jun, MD;  Location: [facility] ENDOSCOPY;  Service: Endoscopy;  Laterality: N/A;   COLONOSCOPY WITH PROPOFOL N/A 01/05/2020   Procedure: COLONOSCOPY WITH PROPOFOL;  Surgeon: Regis Bill, MD;  Location: [facility] ENDOSCOPY;  Service: Endoscopy;  Laterality: N/A;   ECTOPIC PREGNANCY SURGERY  1980   HERNIA REPAIR  2006   Umbilical   KNEE ARTHROSCOPY Left 2006   torn meniscus    TONSILLECTOMY  1971   TOTAL ABDOMINAL HYSTERECTOMY W/ BILATERAL SALPINGOOPHORECTOMY  2006   VESICOVAGINAL FISTULA CLOSURE W/ TAH     Patient Active Problem List   Diagnosis Date Noted   Hx of spontaneous intraparenchymal intracranial hemorrhage 07/12/2021   Intraparenchymal hemorrhage of brain (HCC) 05/31/2021   ICH (intracerebral hemorrhage) (HCC) 05/27/2021   Morbid obesity (HCC) 09/24/2018   Unspecified inflammatory spondylopathy, lumbar region (HCC) 03/05/2018   Lumbar spondylolysis 08/20/2017   Lumbar facet arthropathy 08/20/2017   Lumbar degenerative disc disease 08/20/2017   Chronic constipation 07/30/2017   Primary osteoarthritis involving multiple joints 04/17/2017   Postmenopausal 10/12/2016   Prediabetes 10/12/2016   Lumbago 08/04/2015   Diverticulitis of colon 07/05/2014   Carotid artery disorder (HCC) 04/27/2014   Hyperlipidemia 10/08/2013   Hypertension, benign 11/19/2009    REFERRING DIAG: R spastic hemiplegia   THERAPY DIAG:  Muscle weakness (generalized)  Other lack of coordination  Unsteadiness on feet  Rationale for Evaluation and Treatment Rehabilitation  PERTINENT HISTORY: Terri Werner is a 71 y.o. right-handed female with history of hepatitis B, arthritis, diverticulitis, GERD, hyperlipidemia, hypertension. Patient is recently retired. She has a  daughter and niece in the area. Presented 05/27/2021 with sudden onset of right leg weakness and mild headache. Cranial CT scan showed acute parenchymal hemorrhage in the parasagittal left frontal parietal lobes with mild edema. No substantial mass effect. CT angiogram head and neck no abnormal vascularity in the region of hemorrhage no hemodynamically significant stenosis. MRI follow-up showed no mass effect seen underlying the left posterior frontal hematoma. Patient admitted to inpatient rehab on 05/31/21 and discharged on 06/27/21. Patient used home health at home that stopped two weeks ago. Patient has  R foot drop. Has a wheelchair she does household chores in, a RW, a quad cane and R AFO  PRECAUTIONS: fall, has latex allergy must use latex-free bands  SUBJECTIVE: Pt reports no fall/stumbles. Pt ambulating OK without her brace, she is waiting for new AFO that will be ready on the 12th. Pt reports no major pains, just mild back pain currently. Had appt at [facility] and reports they think pt had back strain following fall a few months ago. She reports it is getting better.  PAIN:  Are you having pain? None  at rest.     TODAY'S TREATMENT:    There.ex:  Seated with # 5 ankle weights  -Seated marches with upright posture 15x each LE. Rates medium.  -Seated LAQ with 3 second holds, 15x each LE, cueing for muscle activation and sequencing for neutral alignment -seated step out 12x each LE. Pt reports more difficulty on RLE due to past strain.  -Seated heel raise, 15x each LE (no weight on RLE)   Standing:  no weights donned -Hip extension with B upper extremity support 24x each LE,  -Hip abduction with B upper extremity support 15x each LE -Hip flexion with B upper extremity support 15x each LE -Hamstring curl with B upper extremity support 20x each LE Pt rates overall sequence as challenging, exhibits fatigue.  Neuro Re-ed:  Obstacle course: 3 objects to clear of differing heights, and 4 cones to weave around (narrowly placed) 4x through Step-ups and retro-step off of 6" step for foot clearance. Cuing provided for large amplitude movement of R LE to assist with clear step. Pt does improve technique following cues.      PATIENT EDUCATION: Education details: Pt educated throughout session about proper posture and technique with exercises. Improved exercise technique, movement at target joints, use of target muscles after min to mod verbal, visual, tactile cues.  Person educated: Patient Education method: Explanation, Demonstration, and Tactile cues Education comprehension:  verbalized understanding, returned demonstration, verbal cues required, and tactile cues required   HOME EXERCISE PROGRAM: Continue as previously given  Added df/pf  exercise to HEP -11/02/21   PT Short Term Goals -      PT SHORT TERM GOAL #1   Title Patient will be independent in home exercise program to improve strength/mobility for better functional independence with ADLs.    Baseline 5/4: HEP given 6/20: does HEP 2x a week 7/31: 3-4x a week   Time 4   Period Weeks    Status Achieved    Target Date 01/05/22             PT Long Term Goals       PT LONG TERM GOAL #1   Title Patient will increase FOTO score to equal to or greater than 63%    to demonstrate statistically significant improvement in mobility and quality of life.    Baseline 5/4: 48%, 6/20: 60% , 7/31: 60%;9/15: 52%  Time 12   Period Weeks    Status Partially Met    Target Date 04/20/2022     PT LONG TERM GOAL #2   Title Patient will increase 10 meter walk test to >1.30m/s as to improve gait speed for better community ambulation and to reduce fall risk.    Baseline 5/4: 0.54 m/s with RW, 6/20: 0.63 m/s 7/31: 0.57 ; with SPC; 9/15: 0.42 m/s with SPC, *without AFO (waiting for new one)   Time 12   Period Weeks    Status Partially Met    Target Date 04/20/2022     PT LONG TERM GOAL #3   Title Patient will demonstrate an improved Berg Balance Score of >45/56 as to demonstrate improved balance with ADLs such as sitting/standing and transfer balance and reduced fall risk.    Baseline 5/4: 32/56, 6/20: 45/56 , 7/31: 49/56; 9/15: 53/56   Time 8   Period Weeks    Status Achieved;   Target Date 02/05/22     PT LONG TERM GOAL #4   Title Patient will increase BLE gross strength to 4+/5 as to improve functional strength for independent gait, increased standing tolerance and increased ADL ability.    Baseline 5/4: see note, 6/20:R knee 4/5 , 7/31: not formally assessed; 9/15: BLE strength is grossly 4/5    Time 12    Period Weeks    Status Partially Met    Target Date 04/20/2022   PT LONG TERM GOAL #5  Title  Patient will increase ABC scale score >80% to demonstrate better functional mobility and better confidence with ADLs.   Baseline 9/15:  Time 12  Period Weeks   Status NEW  Target Date 04/20/2022              Plan     Clinical Impression Statement PT continued plan as laid out in eval and previous treatment sessions. She was able to advance some therex in volume of reps. Pt most challenged today with R foot clearance, particularly with stepping off of 6" step. She was able to clear step when utilizing large-amplitude technique. The pt will continue to benefit from skilled PT services to progress mobility, reduce risk of falls, and address and reduce LBP which is currently impacting her mobility and progress toward goals.    Personal Factors and Comorbidities Comorbidity 3+;Fitness;Past/Current Experience;Time since onset of injury/illness/exacerbation;Transportation    Comorbidities hepatitis B, arthritis, diverticulitis, GERD, hyperlipidemia, hypertension    Examination-Activity Limitations Bed Mobility;Caring for [employer];Locomotion Level;Lift;Squat;Stairs;Stand;Toileting;Transfers    Examination-Participation Restrictions Cleaning;Driving;Meal Prep;Laundry;Shop;Volunteer;Yard Work    Conservation officer, historic buildings Evolving/Moderate complexity    Rehab Potential Fair    PT Frequency 1x / week    PT Duration 12 weeks    PT Treatment/Interventions ADLs/Self Care Home Management;Canalith Repostioning;Cryotherapy;Electrical Stimulation;Iontophoresis 4mg /ml Dexamethasone;Moist Heat;Traction;Ultrasound;Functional mobility training;Stair training;Gait training;DME Instruction;Therapeutic activities;Therapeutic exercise;Balance training;Neuromuscular re-education;Manual techniques;Orthotic Fit/Training;Patient/family education;Passive range of motion;Dry  needling;Taping;Splinting;Energy conservation;Visual/perceptual remediation/compensation;Vestibular    PT Next Visit Plan progress stabilization and strength    PT Home Exercise Plan see above    Consulted and Agree with Plan of Care Patient             Baird Kay PT  5:00 PM, 02/14/22  Physical Therapist - [facility]  Outpatient Physical Therapy- Main Campus [phone]

## 2022-02-19 ENCOUNTER — Ambulatory Visit: Payer: Medicare Other | Admitting: Urgent Care

## 2022-02-19 ENCOUNTER — Ambulatory Visit
Admission: RE | Admit: 2022-02-19 | Discharge: 2022-02-19 | Disposition: A | Discharge status: Home / Self Care | Payer: Medicare Other | Source: Ambulatory Visit | Attending: Urology | Admitting: Urology

## 2022-02-19 ENCOUNTER — Ambulatory Visit: Payer: Medicare Other

## 2022-02-19 ENCOUNTER — Encounter: Payer: Self-pay | Admitting: Urology

## 2022-02-19 ENCOUNTER — Other Ambulatory Visit: Payer: Self-pay

## 2022-02-19 ENCOUNTER — Ambulatory Visit: Payer: Medicare Other | Admitting: Physical Therapy

## 2022-02-19 ENCOUNTER — Encounter
Admission: RE | Disposition: A | Discharge status: Home / Self Care | Payer: Self-pay | Source: Ambulatory Visit | Attending: Urology

## 2022-02-19 DIAGNOSIS — Z6839 Body mass index (BMI) 39.0-39.9, adult: Secondary | ICD-10-CM | POA: Diagnosis not present

## 2022-02-19 DIAGNOSIS — Z8673 Personal history of transient ischemic attack (TIA), and cerebral infarction without residual deficits: Secondary | ICD-10-CM | POA: Insufficient documentation

## 2022-02-19 DIAGNOSIS — M1990 Unspecified osteoarthritis, unspecified site: Secondary | ICD-10-CM | POA: Insufficient documentation

## 2022-02-19 DIAGNOSIS — I10 Essential (primary) hypertension: Secondary | ICD-10-CM | POA: Diagnosis not present

## 2022-02-19 DIAGNOSIS — N3080 Other cystitis without hematuria: Secondary | ICD-10-CM | POA: Insufficient documentation

## 2022-02-19 DIAGNOSIS — B1910 Unspecified viral hepatitis B without hepatic coma: Secondary | ICD-10-CM | POA: Diagnosis not present

## 2022-02-19 DIAGNOSIS — E669 Obesity, unspecified: Secondary | ICD-10-CM | POA: Diagnosis not present

## 2022-02-19 DIAGNOSIS — K219 Gastro-esophageal reflux disease without esophagitis: Secondary | ICD-10-CM | POA: Diagnosis not present

## 2022-02-19 DIAGNOSIS — N321 Vesicointestinal fistula: Secondary | ICD-10-CM | POA: Insufficient documentation

## 2022-02-19 DIAGNOSIS — K449 Diaphragmatic hernia without obstruction or gangrene: Secondary | ICD-10-CM | POA: Insufficient documentation

## 2022-02-19 HISTORY — PX: CYSTOSCOPY WITH BIOPSY: SHX5122

## 2022-02-19 SURGERY — CYSTOSCOPY, WITH BIOPSY
Anesthesia: General | Site: Bladder

## 2022-02-19 MED ORDER — PHENYLEPHRINE 80 MCG/ML (10ML) SYRINGE FOR IV PUSH (FOR BLOOD PRESSURE SUPPORT)
PREFILLED_SYRINGE | INTRAVENOUS | Status: AC
  Filled 2022-02-19: qty 10

## 2022-02-19 MED ORDER — LIDOCAINE HCL (PF) 2 % IJ SOLN
INTRAMUSCULAR | Status: DC | PRN
  Administered 2022-02-19: 40 mg

## 2022-02-19 MED ORDER — PROPOFOL 10 MG/ML IV BOLUS
INTRAVENOUS | Status: DC | PRN
  Administered 2022-02-19: 160 mg via INTRAVENOUS

## 2022-02-19 MED ORDER — FAMOTIDINE 20 MG PO TABS: 20.0000 mg | ORAL_TABLET | Freq: Once | ORAL | Status: AC

## 2022-02-19 MED ORDER — FENTANYL CITRATE (PF) 100 MCG/2ML IJ SOLN: 25.0000 ug | INTRAMUSCULAR | Status: DC | PRN

## 2022-02-19 MED ORDER — GLYCOPYRROLATE 0.2 MG/ML IJ SOLN
INTRAMUSCULAR | Status: DC | PRN
  Administered 2022-02-19: .2 mg via INTRAVENOUS

## 2022-02-19 MED ORDER — EPHEDRINE SULFATE (PRESSORS) 50 MG/ML IJ SOLN
INTRAMUSCULAR | Status: DC | PRN
  Administered 2022-02-19: 10 mg via INTRAVENOUS
  Administered 2022-02-19: 15 mg via INTRAVENOUS

## 2022-02-19 MED ORDER — OXYCODONE HCL 5 MG PO TABS: 5.0000 mg | ORAL_TABLET | Freq: Once | ORAL | Status: DC | PRN

## 2022-02-19 MED ORDER — ONDANSETRON HCL 4 MG/2ML IJ SOLN
INTRAMUSCULAR | Status: AC
  Filled 2022-02-19: qty 2

## 2022-02-19 MED ORDER — ORAL CARE MOUTH RINSE: 15.0000 mL | Freq: Once | OROMUCOSAL | Status: AC

## 2022-02-19 MED ORDER — CEFAZOLIN SODIUM-DEXTROSE 2-4 GM/100ML-% IV SOLN
2.0000 g | INTRAVENOUS | Status: AC
  Administered 2022-02-19: 2 g via INTRAVENOUS

## 2022-02-19 MED ORDER — ONDANSETRON HCL 4 MG/2ML IJ SOLN
INTRAMUSCULAR | Status: DC | PRN
  Administered 2022-02-19: 4 mg via INTRAVENOUS

## 2022-02-19 MED ORDER — OXYCODONE HCL 5 MG/5ML PO SOLN: 5.0000 mg | Freq: Once | ORAL | Status: DC | PRN

## 2022-02-19 MED ORDER — DEXAMETHASONE SODIUM PHOSPHATE 10 MG/ML IJ SOLN
INTRAMUSCULAR | Status: DC | PRN
  Administered 2022-02-19: 10 mg via INTRAVENOUS

## 2022-02-19 MED ORDER — CHLORHEXIDINE GLUCONATE 0.12 % MT SOLN
OROMUCOSAL | Status: AC
  Administered 2022-02-19: 15 mL via OROMUCOSAL
  Filled 2022-02-19: qty 15

## 2022-02-19 MED ORDER — GLYCOPYRROLATE 0.2 MG/ML IJ SOLN
INTRAMUSCULAR | Status: AC
  Filled 2022-02-19: qty 1

## 2022-02-19 MED ORDER — CEFAZOLIN SODIUM-DEXTROSE 2-4 GM/100ML-% IV SOLN
INTRAVENOUS | Status: AC
  Filled 2022-02-19: qty 100

## 2022-02-19 MED ORDER — FENTANYL CITRATE (PF) 100 MCG/2ML IJ SOLN
INTRAMUSCULAR | Status: AC
  Filled 2022-02-19: qty 2

## 2022-02-19 MED ORDER — STERILE WATER FOR IRRIGATION IR SOLN
Status: DC | PRN
  Administered 2022-02-19: 3000 mL

## 2022-02-19 MED ORDER — CHLORHEXIDINE GLUCONATE 0.12 % MT SOLN: 15.0000 mL | Freq: Once | OROMUCOSAL | Status: AC

## 2022-02-19 MED ORDER — DEXAMETHASONE SODIUM PHOSPHATE 10 MG/ML IJ SOLN
INTRAMUSCULAR | Status: AC
  Filled 2022-02-19: qty 1

## 2022-02-19 MED ORDER — PHENYLEPHRINE HCL (PRESSORS) 10 MG/ML IV SOLN
INTRAVENOUS | Status: DC | PRN
  Administered 2022-02-19: 160 ug via INTRAVENOUS

## 2022-02-19 MED ORDER — FENTANYL CITRATE (PF) 100 MCG/2ML IJ SOLN
INTRAMUSCULAR | Status: DC | PRN
  Administered 2022-02-19 (×2): 50 ug via INTRAVENOUS

## 2022-02-19 MED ORDER — FAMOTIDINE 20 MG PO TABS
ORAL_TABLET | ORAL | Status: AC
  Administered 2022-02-19: 20 mg via ORAL
  Filled 2022-02-19: qty 1

## 2022-02-19 MED ORDER — LACTATED RINGERS IV SOLN: INTRAVENOUS | Status: DC

## 2022-02-19 SURGICAL SUPPLY — 16 items
BAG DRAIN SIEMENS DORNER NS (MISCELLANEOUS) ×1 IMPLANT
BAG DRN NS LF (MISCELLANEOUS) ×1
BRUSH SCRUB EZ  4% CHG (MISCELLANEOUS) ×1
BRUSH SCRUB EZ 4% CHG (MISCELLANEOUS) ×1 IMPLANT
DRSG TELFA 3X4 N-ADH STERILE (GAUZE/BANDAGES/DRESSINGS) ×1 IMPLANT
ELECT REM PT RETURN 9FT ADLT (ELECTROSURGICAL) ×1
ELECTRODE REM PT RTRN 9FT ADLT (ELECTROSURGICAL) ×1 IMPLANT
GLOVE BIO SURGEON STRL SZ 6.5 (GLOVE) ×1 IMPLANT
GOWN STRL REUS W/ TWL LRG LVL3 (GOWN DISPOSABLE) ×2 IMPLANT
GOWN STRL REUS W/TWL LRG LVL3 (GOWN DISPOSABLE) ×2
KIT TURNOVER CYSTO (KITS) ×1 IMPLANT
PACK CYSTO AR (MISCELLANEOUS) ×1 IMPLANT
SET CYSTO W/LG BORE CLAMP LF (SET/KITS/TRAYS/PACK) ×1 IMPLANT
SURGILUBE 2OZ TUBE FLIPTOP (MISCELLANEOUS) ×1 IMPLANT
WATER STERILE IRR 3000ML UROMA (IV SOLUTION) ×1 IMPLANT
WATER STERILE IRR 500ML POUR (IV SOLUTION) ×1 IMPLANT

## 2022-02-19 NOTE — Anesthesia Preprocedure Evaluation (Signed)
Anesthesia Evaluation  Patient identified by MRN, date of birth, ID band Patient awake    Reviewed: Allergy & Precautions, NPO status , Patient's Chart, lab work & pertinent test results  History of Anesthesia Complications Negative for: history of anesthetic complications  Airway Mallampati: III  TM Distance: >3 FB Neck ROM: full    Dental  (+) Chipped   Pulmonary neg pulmonary ROS, neg shortness of breath,    Pulmonary exam normal        Cardiovascular Exercise Tolerance: Good hypertension, Normal cardiovascular exam     Neuro/Psych CVA, Residual Symptoms negative psych ROS   GI/Hepatic hiatal hernia, GERD  Controlled,(+) Hepatitis -  Endo/Other  negative endocrine ROS  Renal/GU      Musculoskeletal   Abdominal   Peds  Hematology negative hematology ROS (+)   Anesthesia Other Findings Past Medical History: No date: Arthritis     Comment:  pt reports in back & knees 2010: Colon polyps     Comment:  At [facility] 2016: Diverticulitis No date: Genital warts No date: GERD (gastroesophageal reflux disease) No date: Heart murmur No date: Hepatitis B No date: History of blood transfusion     Comment:  during each major surgery per pt No date: History of chicken pox No date: History of hiatal hernia No date: History of torn meniscus of right knee No date: HTN (hypertension) No date: Hx of migraine headaches No date: Hypertension No date: Hypopotassemia No date: Pre-diabetes 05/27/2021: Stroke [facility])  Past Surgical History: 2006: ABDOMINAL HYSTERECTOMY 1980: APPENDECTOMY 1981: CESAREAN SECTION 11/01/2014: COLONOSCOPY WITH PROPOFOL; N/A     Comment:  Procedure: COLONOSCOPY WITH PROPOFOL;  Surgeon: Manya Silvas, MD;  Location: [facility] ENDOSCOPY;  Service:               Endoscopy;  Laterality: N/A; 01/05/2020: COLONOSCOPY WITH PROPOFOL; N/A     Comment:  Procedure: COLONOSCOPY WITH PROPOFOL;   Surgeon:               [name], MD;  Location: [facility] ENDOSCOPY;                Service: Endoscopy;  Laterality: N/A; 1980: ECTOPIC PREGNANCY SURGERY 2006: HERNIA REPAIR     Comment:  Umbilical 9678: KNEE ARTHROSCOPY; Left     Comment:  torn meniscus 1971: TONSILLECTOMY 2006: TOTAL ABDOMINAL HYSTERECTOMY W/ BILATERAL SALPINGOOPHORECTOMY No date: VESICOVAGINAL FISTULA CLOSURE W/ TAH  BMI    Body Mass Index: 39.96 kg/m      Reproductive/Obstetrics negative OB ROS                             Anesthesia Physical Anesthesia Plan  ASA: 3  Anesthesia Plan: General LMA   Post-op Pain Management:    Induction: Intravenous  PONV Risk Score and Plan: Dexamethasone, Ondansetron, Midazolam and Treatment may vary due to age or medical condition  Airway Management Planned: LMA  Additional Equipment:   Intra-op Plan:   Post-operative Plan: Extubation in OR  Informed Consent: I have reviewed the patients History and Physical, chart, labs and discussed the procedure including the risks, benefits and alternatives for the proposed anesthesia with the patient or authorized representative who has indicated his/her understanding and acceptance.     Dental Advisory Given  Plan Discussed with: Anesthesiologist, CRNA and Surgeon  Anesthesia Plan Comments: (Patient consented for risks of anesthesia including  but not limited to:  - adverse reactions to medications - damage to eyes, teeth, lips or other oral mucosa - nerve damage due to positioning  - sore throat or hoarseness - Damage to heart, brain, nerves, lungs, other parts of body or loss of life  Patient voiced understanding.)        Anesthesia Quick Evaluation

## 2022-02-19 NOTE — Interval H&P Note (Signed)
History and Physical Interval Note:  02/19/2022 10:40 AM  Terri Werner  has presented today for surgery, with the diagnosis of Colovesical Fistula.  The various methods of treatment have been discussed with the patient and family. After consideration of risks, benefits and other options for treatment, the patient has consented to  Procedure(s): CYSTOSCOPY WITH BLADDER BIOPSY (N/A) as a surgical intervention.  The patient's history has been reviewed, patient examined, no change in status, stable for surgery.  I have reviewed the patient's chart and labs.  Questions were answered to the patient's satisfaction.    RRR cTAB  Preop abx taken, will complete course post op   Hollice Espy

## 2022-02-19 NOTE — Op Note (Signed)
Date of procedure: 02/19/22  Preoperative diagnosis:  Colovesical fistula  Postoperative diagnosis:  Same as above  Procedure: Bladder biopsy/colovesical fistula biopsy  Surgeon: Hollice Espy, MD  Anesthesia: General  Complications: None  Intraoperative findings: Fistulous tract appreciated on posterior bladder wall, very small lumen with surrounding mostly edematous appearing changes.  1 single intraluminal and few small surrounding biopsies.  Fulguration in and around the fistulous tract.  EBL: Minimal  Specimens: Colovesical fistula  Drains: None  Indication: Terri Werner is a 71 y.o. patient with personal history of pneumaturia found to have very small colovesical fistula which is not appreciated on CT scan by cystoscopy with more than expected peri-fistulous changes.  After reviewing the management options for treatment, she elected to proceed with the above surgical procedure(s). We have discussed the potential benefits and risks of the procedure, side effects of the proposed treatment, the likelihood of the patient achieving the goals of the procedure, and any potential problems that might occur during the procedure or recuperation. Informed consent has been obtained.  Description of procedure:  The patient was taken to the operating room and general anesthesia was induced.  The patient was placed in the dorsal lithotomy position, prepped and draped in the usual sterile fashion, and preoperative antibiotics were administered. A preoperative time-out was performed.   A 21 French scope was advanced per urethra into the bladder.  The posterior bladder wall, small fistulous tract with some tissue extruding from the lumen was noted with halo of perifistulous tract changes which primarily appeared to be edematous but neoplasm could not be ruled out.  Using a cold cup biopsy forceps, a few small superficial bites were taken of the abnormal tissue surrounding the tract.   I did take 1 small bite within the lumen and pulled out a small piece of what appeared to be granulation tissue which was included with the biopsy.  At this time, Bugbee electrocautery was used to fulgurate the entirety of the area including within the lumen albeit relatively superficially.  Adequate hemostasis was achieved.  The bladder was drained and the scope was removed.  She was cleaned and dried, repositioned in supine position, first of anesthesia and taken to the PACU in stable condition.  Plan: We will follow-up with her pathology results.  Given that she is minimally symptomatic, may continue to follow her clinically if her symptoms continue to be well controlled but if they are worse, would consider colectomy with closure of the fistulous tract.  Hollice Espy, M.D.

## 2022-02-19 NOTE — Discharge Instructions (Addendum)

## 2022-02-19 NOTE — Anesthesia Postprocedure Evaluation (Signed)
Anesthesia Post Note  Patient: Susannah Carbin [place]  Procedure(s) Performed: CYSTOSCOPY WITH BLADDER BIOPSY (Bladder)  Patient location during evaluation: PACU Anesthesia Type: General Level of consciousness: awake and alert Pain management: pain level controlled Vital Signs Assessment: post-procedure vital signs reviewed and stable Respiratory status: spontaneous breathing, nonlabored ventilation, respiratory function stable and patient connected to nasal cannula oxygen Cardiovascular status: blood pressure returned to baseline and stable Postop Assessment: no apparent nausea or vomiting Anesthetic complications: no   No notable events documented.   Last Vitals:  Vitals:   02/19/22 1200 02/19/22 1213  BP: 116/67 (!) 142/78  Pulse: 77 81  Resp: 15 16  Temp: (!) 36.2 C (!) 36.4 C  SpO2: 99% 100%    Last Pain:  Vitals:   02/19/22 1213  TempSrc: Temporal  PainSc: 0-No pain                 Precious Haws Ayla Dunigan

## 2022-02-19 NOTE — Transfer of Care (Signed)
Immediate Anesthesia Transfer of Care Note  Patient: Terri Werner  Procedure(s) Performed: CYSTOSCOPY WITH BLADDER BIOPSY (Bladder)  Patient Location: PACU  Anesthesia Type:General  Level of Consciousness: drowsy  Airway & Oxygen Therapy: Patient Spontanous Breathing and Patient connected to face mask oxygen  Post-op Assessment: Report given to RN and Post -op Vital signs reviewed and stable  Post vital signs: Reviewed  Last Vitals:  Vitals Value Taken Time  BP    Temp    Pulse    Resp    SpO2      Last Pain:  Vitals:   02/19/22 0902  TempSrc: Temporal  PainSc: 0-No pain         Complications: No notable events documented.

## 2022-02-19 NOTE — Anesthesia Procedure Notes (Signed)
Procedure Name: LMA Insertion Date/Time: 02/19/2022 11:12 AM  Performed by: Rolla Plate, CRNAPre-anesthesia Checklist: Patient identified, Patient being monitored, Timeout performed, Emergency Drugs available and Suction available Patient Re-evaluated:Patient Re-evaluated prior to induction Oxygen Delivery Method: Circle system utilized Preoxygenation: Pre-oxygenation with 100% oxygen Induction Type: IV induction Ventilation: Mask ventilation without difficulty LMA: LMA inserted LMA Size: 3.0 Tube type: Oral Number of attempts: 1 Placement Confirmation: positive ETCO2 and breath sounds checked- equal and bilateral Tube secured with: Tape Dental Injury: Teeth and Oropharynx as per pre-operative assessment

## 2022-02-20 ENCOUNTER — Encounter: Payer: Self-pay | Admitting: Urology

## 2022-02-20 LAB — SURGICAL PATHOLOGY

## 2022-02-20 NOTE — Therapy (Signed)
OUTPATIENT PHYSICAL THERAPY TREATMENT NOTE     Patient Name: Terri Werner MRN: [id number] DOB:1950/07/15, 71 y.o., female Today's Date: 02/21/2022  PCP: Terri Mail DO  REFERRING PROVIDER: Erick Colace MD    PT End of Session - 02/21/22 1438     Visit Number 37    Number of Visits 42    Date for PT Re-Evaluation 04/20/22    Authorization Type BCBS Medicare    Authorization Time Period 01/26/22-04/20/22    Progress Note Due on Visit 50    PT Start Time 1438    PT Stop Time 1514    PT Time Calculation (min) 36 min    Equipment Utilized During Treatment Gait belt    Activity Tolerance Patient tolerated treatment well    Behavior During Therapy WFL for tasks assessed/performed                             Past Medical History:  Diagnosis Date   Arthritis    pt reports in back & knees   Colon polyps 2010   At [facility]   Diverticulitis 2016   Genital warts    GERD (gastroesophageal reflux disease)    Heart murmur    Hepatitis B    History of blood transfusion    during each major surgery per pt   History of chicken pox    History of hiatal hernia    History of torn meniscus of right knee    HTN (hypertension)    Hx of migraine headaches    Hypertension    Hypopotassemia    Pre-diabetes    Stroke (HCC) 05/27/2021   Past Surgical History:  Procedure Laterality Date   ABDOMINAL HYSTERECTOMY  2006   APPENDECTOMY  1980   CESAREAN SECTION  1981   COLONOSCOPY WITH PROPOFOL N/A 11/01/2014   Procedure: COLONOSCOPY WITH PROPOFOL;  Surgeon: Terri Jun, MD;  Location: [facility] ENDOSCOPY;  Service: Endoscopy;  Laterality: N/A;   COLONOSCOPY WITH PROPOFOL N/A 01/05/2020   Procedure: COLONOSCOPY WITH PROPOFOL;  Surgeon: Terri Bill, MD;  Location: [facility] ENDOSCOPY;  Service: Endoscopy;  Laterality: N/A;   CYSTOSCOPY WITH BIOPSY N/A 02/19/2022   Procedure: CYSTOSCOPY WITH BLADDER BIOPSY;  Surgeon: Terri Scotland, MD;  Location:  [facility] ORS;  Service: Urology;  Laterality: N/A;   ECTOPIC PREGNANCY SURGERY  1980   HERNIA REPAIR  2006   Umbilical   KNEE ARTHROSCOPY Left 2006   torn meniscus   TONSILLECTOMY  1971   TOTAL ABDOMINAL HYSTERECTOMY W/ BILATERAL SALPINGOOPHORECTOMY  2006   VESICOVAGINAL FISTULA CLOSURE W/ TAH     Patient Active Problem List   Diagnosis Date Noted   Hx of spontaneous intraparenchymal intracranial hemorrhage 07/12/2021   Intraparenchymal hemorrhage of brain (HCC) 05/31/2021   ICH (intracerebral hemorrhage) (HCC) 05/27/2021   Morbid obesity (HCC) 09/24/2018   Unspecified inflammatory spondylopathy, lumbar region (HCC) 03/05/2018   Lumbar spondylolysis 08/20/2017   Lumbar facet arthropathy 08/20/2017   Lumbar degenerative disc disease 08/20/2017   Chronic constipation 07/30/2017   Primary osteoarthritis involving multiple joints 04/17/2017   Postmenopausal 10/12/2016   Prediabetes 10/12/2016   Lumbago 08/04/2015   Diverticulitis of colon 07/05/2014   Carotid artery disorder (HCC) 04/27/2014   Hyperlipidemia 10/08/2013   Hypertension, benign 11/19/2009    REFERRING DIAG: R spastic hemiplegia   THERAPY DIAG:  Muscle weakness (generalized)  Unsteadiness on feet  Other low back pain  Rationale for Evaluation and Treatment  Rehabilitation  PERTINENT HISTORY: Terri Werner is a 71 y.o. right-handed female with history of hepatitis B, arthritis, diverticulitis, GERD, hyperlipidemia, hypertension. Patient is recently retired. She has a daughter and niece in the area. Presented 05/27/2021 with sudden onset of right leg weakness and mild headache. Cranial CT scan showed acute parenchymal hemorrhage in the parasagittal left frontal parietal lobes with mild edema. No substantial mass effect. CT angiogram head and neck no abnormal vascularity in the region of hemorrhage no hemodynamically significant stenosis. MRI follow-up showed no mass effect seen underlying the left posterior  frontal hematoma. Patient admitted to inpatient rehab on 05/31/21 and discharged on 06/27/21. Patient used home health at home that stopped two weeks ago. Patient has R foot drop. Has a wheelchair she does household chores in, a RW, a quad cane and R AFO  PRECAUTIONS: fall, has latex allergy must use latex-free bands  SUBJECTIVE: Patient had procedure and was relieved to find that the spot was non cancerous.   PAIN:  Are you having pain? None  at rest.     TODAY'S TREATMENT:    Superset:x2 trials  -ambulate 150 ft no AD with CGA, one near LOB  -10x STS no UE support   Next to support bar: -modified lateral lunge onto bosu ball: 10x each LE, SUE support -modified forward lunge onto bosu ball 10x each LE, SUE support -standing heel raise 15x   Seated: 6" step toe taps 30 seconds x2 trials  GTB alternating IR/ER 15x each LE  GTB alternating LAQ kicks 10x each LE    PATIENT EDUCATION: Education details: Pt educated throughout session about proper posture and technique with exercises. Improved exercise technique, movement at target joints, use of target muscles after min to mod verbal, visual, tactile cues.  Person educated: Patient Education method: Explanation, Demonstration, and Tactile cues Education comprehension: verbalized understanding, returned demonstration, verbal cues required, and tactile cues required   HOME EXERCISE PROGRAM: Continue as previously given  Added df/pf  exercise to HEP -11/02/21   PT Short Term Goals -      PT SHORT TERM GOAL #1   Title Patient will be independent in home exercise program to improve strength/mobility for better functional independence with ADLs.    Baseline 5/4: HEP given 6/20: does HEP 2x a week 7/31: 3-4x a week   Time 4   Period Weeks    Status Achieved    Target Date 01/05/22             PT Long Term Goals       PT LONG TERM GOAL #1   Title Patient will increase FOTO score to equal to or greater than 63%    to  demonstrate statistically significant improvement in mobility and quality of life.    Baseline 5/4: 48%, 6/20: 60% , 7/31: 60%;9/15: 52%   Time 12   Period Weeks    Status Partially Met    Target Date 04/20/2022     PT LONG TERM GOAL #2   Title Patient will increase 10 meter walk test to >1.69m/s as to improve gait speed for better community ambulation and to reduce fall risk.    Baseline 5/4: 0.54 m/s with RW, 6/20: 0.63 m/s 7/31: 0.57 ; with SPC; 9/15: 0.42 m/s with SPC, *without AFO (waiting for new one)   Time 12   Period Weeks    Status Partially Met    Target Date 04/20/2022     PT LONG TERM GOAL #3  Title Patient will demonstrate an improved Berg Balance Score of >45/56 as to demonstrate improved balance with ADLs such as sitting/standing and transfer balance and reduced fall risk.    Baseline 5/4: 32/56, 6/20: 45/56 , 7/31: 49/56; 9/15: 53/56   Time 8   Period Weeks    Status Achieved;   Target Date 02/05/22     PT LONG TERM GOAL #4   Title Patient will increase BLE gross strength to 4+/5 as to improve functional strength for independent gait, increased standing tolerance and increased ADL ability.    Baseline 5/4: see note, 6/20:R knee 4/5 , 7/31: not formally assessed; 9/15: BLE strength is grossly 4/5    Time 12   Period Weeks    Status Partially Met    Target Date 04/20/2022   PT LONG TERM GOAL #5  Title  Patient will increase ABC scale score >80% to demonstrate better functional mobility and better confidence with ADLs.   Baseline 9/15:  Time 12  Period Weeks   Status NEW  Target Date 04/20/2022              Plan     Clinical Impression Statement Patient session limited by late arrival. Patient tolerates ambulation without SPC with close CGA due to instability with focus on increasing capacity for functional mobility.  Limited hip/foot clearance of RLE continues to be present in sitting and standing. The pt will continue to benefit from skilled PT services to  progress mobility, reduce risk of falls, and address and reduce LBP which is currently impacting her mobility and progress toward goals.    Personal Factors and Comorbidities Comorbidity 3+;Fitness;Past/Current Experience;Time since onset of injury/illness/exacerbation;Transportation    Comorbidities hepatitis B, arthritis, diverticulitis, GERD, hyperlipidemia, hypertension    Examination-Activity Limitations Bed Mobility;Caring for [employer];Locomotion Level;Lift;Squat;Stairs;Stand;Toileting;Transfers    Examination-Participation Restrictions Cleaning;Driving;Meal Prep;Laundry;Shop;Volunteer;Yard Work    Conservation officer, historic buildings Evolving/Moderate complexity    Rehab Potential Fair    PT Frequency 1x / week    PT Duration 12 weeks    PT Treatment/Interventions ADLs/Self Care Home Management;Canalith Repostioning;Cryotherapy;Electrical Stimulation;Iontophoresis 4mg /ml Dexamethasone;Moist Heat;Traction;Ultrasound;Functional mobility training;Stair training;Gait training;DME Instruction;Therapeutic activities;Therapeutic exercise;Balance training;Neuromuscular re-education;Manual techniques;Orthotic Fit/Training;Patient/family education;Passive range of motion;Dry needling;Taping;Splinting;Energy conservation;Visual/perceptual remediation/compensation;Vestibular    PT Next Visit Plan progress stabilization and strength    PT Home Exercise Plan see above    Consulted and Agree with Plan of Care Patient             Precious Bard PT  3:15 PM, 02/21/22  Physical Therapist - [facility]  Outpatient Physical Therapy- Main Campus [phone]

## 2022-02-21 ENCOUNTER — Ambulatory Visit: Payer: Medicare Other | Admitting: Physical Therapy

## 2022-02-21 ENCOUNTER — Ambulatory Visit: Payer: Medicare Other

## 2022-02-21 DIAGNOSIS — M6281 Muscle weakness (generalized): Secondary | ICD-10-CM

## 2022-02-21 DIAGNOSIS — R262 Difficulty in walking, not elsewhere classified: Secondary | ICD-10-CM | POA: Diagnosis not present

## 2022-02-21 DIAGNOSIS — R2681 Unsteadiness on feet: Secondary | ICD-10-CM | POA: Diagnosis not present

## 2022-02-21 DIAGNOSIS — R278 Other lack of coordination: Secondary | ICD-10-CM | POA: Diagnosis not present

## 2022-02-21 DIAGNOSIS — R269 Unspecified abnormalities of gait and mobility: Secondary | ICD-10-CM | POA: Diagnosis not present

## 2022-02-21 DIAGNOSIS — M5459 Other low back pain: Secondary | ICD-10-CM | POA: Diagnosis not present

## 2022-02-22 NOTE — Therapy (Signed)
OUTPATIENT PHYSICAL THERAPY TREATMENT NOTE     Patient Name: Terri Werner MRN: [id number] DOB:[birth year], 71 y.o., female Today's Date: 02/26/2022  PCP: Teodora Medici DO  REFERRING PROVIDER: Charlett Blake MD    PT End of Session - 02/26/22 1436     Visit Number 38    Number of Visits 42    Date for PT Re-Evaluation 04/20/22    Authorization Type BCBS Medicare    Authorization Time Period 01/26/22-04/20/22    Progress Note Due on Visit 23    PT Start Time 1436    PT Stop Time 1514    PT Time Calculation (min) 38 min    Equipment Utilized During Treatment Gait belt    Activity Tolerance Patient tolerated treatment well    Behavior During Therapy WFL for tasks assessed/performed                              Past Medical History:  Diagnosis Date   Arthritis    pt reports in back & knees   Colon polyps 2010   At [facility]   Diverticulitis 2016   Genital warts    GERD (gastroesophageal reflux disease)    Heart murmur    Hepatitis B    History of blood transfusion    during each major surgery per pt   History of chicken pox    History of hiatal hernia    History of torn meniscus of right knee    HTN (hypertension)    Hx of migraine headaches    Hypertension    Hypopotassemia    Pre-diabetes    Stroke ([place]) 05/27/2021   Past Surgical History:  Procedure Laterality Date   ABDOMINAL HYSTERECTOMY  2006   [facility]   COLONOSCOPY WITH PROPOFOL N/A 11/01/2014   Procedure: COLONOSCOPY WITH PROPOFOL;  Surgeon: Manya Silvas, MD;  Location: [facility];  Service: Endoscopy;  Laterality: N/A;   COLONOSCOPY WITH PROPOFOL N/A 01/05/2020   Procedure: COLONOSCOPY WITH PROPOFOL;  Surgeon: [name], MD;  Location: [facility] ENDOSCOPY;  Service: Endoscopy;  Laterality: N/A;   CYSTOSCOPY WITH BIOPSY N/A 02/19/2022   Procedure: CYSTOSCOPY WITH BLADDER BIOPSY;  Surgeon: Hollice Espy, MD;  Location:  [facility] ORS;  Service: Urology;  Laterality: N/A;   [place]   HERNIA REPAIR  [id number]   Umbilical   KNEE ARTHROSCOPY Left 2006   torn meniscus   TONSILLECTOMY  1971   TOTAL ABDOMINAL HYSTERECTOMY W/ BILATERAL SALPINGOOPHORECTOMY  2006   VESICOVAGINAL FISTULA CLOSURE W/ TAH     Patient Active Problem List   Diagnosis Date Noted   Hx of spontaneous intraparenchymal intracranial hemorrhage 07/12/2021   Intraparenchymal hemorrhage of brain ([place]) 05/31/2021   ICH (intracerebral hemorrhage) ([place]) 05/27/2021   Morbid obesity ([place]) 09/24/2018   Unspecified inflammatory spondylopathy, lumbar region ([place]) 03/05/2018   Lumbar spondylolysis 08/20/2017   Lumbar facet arthropathy 08/20/2017   Lumbar degenerative disc disease 08/20/2017   Chronic constipation 07/30/2017   Primary osteoarthritis involving multiple joints 04/17/2017   Postmenopausal 10/12/2016   Prediabetes 10/12/2016   Lumbago 08/04/2015   Diverticulitis of colon 07/05/2014   Carotid artery disorder ([place]) 04/27/2014   Hyperlipidemia 10/08/2013   Hypertension, benign 11/19/2009    REFERRING DIAG: R spastic hemiplegia   THERAPY DIAG:  Muscle weakness (generalized)  Unsteadiness on feet  Other low back pain  Other lack of coordination  OUTPATIENT PHYSICAL THERAPY TREATMENT NOTE     Patient Name: Terri Werner MRN: [id number] DOB:[birth year], 71 y.o., female Today's Date: 02/26/2022  PCP: Teodora Medici DO  REFERRING PROVIDER: Charlett Blake MD    PT End of Session - 02/26/22 1436     Visit Number 38    Number of Visits 42    Date for PT Re-Evaluation 04/20/22    Authorization Type BCBS Medicare    Authorization Time Period 01/26/22-04/20/22    Progress Note Due on Visit 23    PT Start Time 1436    PT Stop Time 1514    PT Time Calculation (min) 38 min    Equipment Utilized During Treatment Gait belt    Activity Tolerance Patient tolerated treatment well    Behavior During Therapy WFL for tasks assessed/performed                              Past Medical History:  Diagnosis Date   Arthritis    pt reports in back & knees   Colon polyps 2010   At [facility]   Diverticulitis 2016   Genital warts    GERD (gastroesophageal reflux disease)    Heart murmur    Hepatitis B    History of blood transfusion    during each major surgery per pt   History of chicken pox    History of hiatal hernia    History of torn meniscus of right knee    HTN (hypertension)    Hx of migraine headaches    Hypertension    Hypopotassemia    Pre-diabetes    Stroke ([place]) 05/27/2021   Past Surgical History:  Procedure Laterality Date   ABDOMINAL HYSTERECTOMY  2006   [facility]   COLONOSCOPY WITH PROPOFOL N/A 11/01/2014   Procedure: COLONOSCOPY WITH PROPOFOL;  Surgeon: Manya Silvas, MD;  Location: [facility];  Service: Endoscopy;  Laterality: N/A;   COLONOSCOPY WITH PROPOFOL N/A 01/05/2020   Procedure: COLONOSCOPY WITH PROPOFOL;  Surgeon: [name], MD;  Location: [facility] ENDOSCOPY;  Service: Endoscopy;  Laterality: N/A;   CYSTOSCOPY WITH BIOPSY N/A 02/19/2022   Procedure: CYSTOSCOPY WITH BLADDER BIOPSY;  Surgeon: Hollice Espy, MD;  Location:  [facility] ORS;  Service: Urology;  Laterality: N/A;   [place]   HERNIA REPAIR  [id number]   Umbilical   KNEE ARTHROSCOPY Left 2006   torn meniscus   TONSILLECTOMY  1971   TOTAL ABDOMINAL HYSTERECTOMY W/ BILATERAL SALPINGOOPHORECTOMY  2006   VESICOVAGINAL FISTULA CLOSURE W/ TAH     Patient Active Problem List   Diagnosis Date Noted   Hx of spontaneous intraparenchymal intracranial hemorrhage 07/12/2021   Intraparenchymal hemorrhage of brain ([place]) 05/31/2021   ICH (intracerebral hemorrhage) ([place]) 05/27/2021   Morbid obesity ([place]) 09/24/2018   Unspecified inflammatory spondylopathy, lumbar region ([place]) 03/05/2018   Lumbar spondylolysis 08/20/2017   Lumbar facet arthropathy 08/20/2017   Lumbar degenerative disc disease 08/20/2017   Chronic constipation 07/30/2017   Primary osteoarthritis involving multiple joints 04/17/2017   Postmenopausal 10/12/2016   Prediabetes 10/12/2016   Lumbago 08/04/2015   Diverticulitis of colon 07/05/2014   Carotid artery disorder ([place]) 04/27/2014   Hyperlipidemia 10/08/2013   Hypertension, benign 11/19/2009    REFERRING DIAG: R spastic hemiplegia   THERAPY DIAG:  Muscle weakness (generalized)  Unsteadiness on feet  Other low back pain  Other lack of coordination  OUTPATIENT PHYSICAL THERAPY TREATMENT NOTE     Patient Name: Terri Werner MRN: [id number] DOB:[birth year], 71 y.o., female Today's Date: 02/26/2022  PCP: Teodora Medici DO  REFERRING PROVIDER: Charlett Blake MD    PT End of Session - 02/26/22 1436     Visit Number 38    Number of Visits 42    Date for PT Re-Evaluation 04/20/22    Authorization Type BCBS Medicare    Authorization Time Period 01/26/22-04/20/22    Progress Note Due on Visit 23    PT Start Time 1436    PT Stop Time 1514    PT Time Calculation (min) 38 min    Equipment Utilized During Treatment Gait belt    Activity Tolerance Patient tolerated treatment well    Behavior During Therapy WFL for tasks assessed/performed                              Past Medical History:  Diagnosis Date   Arthritis    pt reports in back & knees   Colon polyps 2010   At [facility]   Diverticulitis 2016   Genital warts    GERD (gastroesophageal reflux disease)    Heart murmur    Hepatitis B    History of blood transfusion    during each major surgery per pt   History of chicken pox    History of hiatal hernia    History of torn meniscus of right knee    HTN (hypertension)    Hx of migraine headaches    Hypertension    Hypopotassemia    Pre-diabetes    Stroke ([place]) 05/27/2021   Past Surgical History:  Procedure Laterality Date   ABDOMINAL HYSTERECTOMY  2006   [place]   COLONOSCOPY WITH PROPOFOL N/A 11/01/2014   Procedure: COLONOSCOPY WITH PROPOFOL;  Surgeon: Manya Silvas, MD;  Location: [facility];  Service: Endoscopy;  Laterality: N/A;   COLONOSCOPY WITH PROPOFOL N/A 01/05/2020   Procedure: COLONOSCOPY WITH PROPOFOL;  Surgeon: [name], MD;  Location: [facility] ENDOSCOPY;  Service: Endoscopy;  Laterality: N/A;   CYSTOSCOPY WITH BIOPSY N/A 02/19/2022   Procedure: CYSTOSCOPY WITH BLADDER BIOPSY;  Surgeon: Hollice Espy, MD;  Location:  [facility];  Service: Urology;  Laterality: N/A;   [place]   HERNIA REPAIR  [id number]   Umbilical   KNEE ARTHROSCOPY Left 2006   torn meniscus   TONSILLECTOMY  1971   TOTAL ABDOMINAL HYSTERECTOMY W/ BILATERAL SALPINGOOPHORECTOMY  2006   VESICOVAGINAL FISTULA CLOSURE W/ TAH     Patient Active Problem List   Diagnosis Date Noted   Hx of spontaneous intraparenchymal intracranial hemorrhage 07/12/2021   Intraparenchymal hemorrhage of brain ([place]) 05/31/2021   ICH (intracerebral hemorrhage) ([place]) 05/27/2021   Morbid obesity ([place]) 09/24/2018   Unspecified inflammatory spondylopathy, lumbar region ([place]) 03/05/2018   Lumbar spondylolysis 08/20/2017   Lumbar facet arthropathy 08/20/2017   Lumbar degenerative disc disease 08/20/2017   Chronic constipation 07/30/2017   Primary osteoarthritis involving multiple joints 04/17/2017   Postmenopausal 10/12/2016   Prediabetes 10/12/2016   Lumbago 08/04/2015   Diverticulitis of colon 07/05/2014   Carotid artery disorder ([place]) 04/27/2014   Hyperlipidemia 10/08/2013   Hypertension, benign 11/19/2009    REFERRING DIAG: R spastic hemiplegia   THERAPY DIAG:  Muscle weakness (generalized)  Unsteadiness on feet  Other low back pain  Other lack of coordination

## 2022-02-26 ENCOUNTER — Ambulatory Visit: Payer: Medicare Other | Admitting: Physical Therapy

## 2022-02-26 ENCOUNTER — Ambulatory Visit: Payer: Medicare Other

## 2022-02-26 DIAGNOSIS — R278 Other lack of coordination: Secondary | ICD-10-CM

## 2022-02-26 DIAGNOSIS — M5459 Other low back pain: Secondary | ICD-10-CM

## 2022-02-26 DIAGNOSIS — R262 Difficulty in walking, not elsewhere classified: Secondary | ICD-10-CM | POA: Diagnosis not present

## 2022-02-26 DIAGNOSIS — R2681 Unsteadiness on feet: Secondary | ICD-10-CM | POA: Diagnosis not present

## 2022-02-26 DIAGNOSIS — M6281 Muscle weakness (generalized): Secondary | ICD-10-CM | POA: Diagnosis not present

## 2022-02-26 DIAGNOSIS — R269 Unspecified abnormalities of gait and mobility: Secondary | ICD-10-CM | POA: Diagnosis not present

## 2022-02-27 ENCOUNTER — Encounter: Payer: Medicare Other | Attending: Registered Nurse | Admitting: Physical Medicine & Rehabilitation

## 2022-02-27 ENCOUNTER — Encounter: Payer: Self-pay | Admitting: Physical Medicine & Rehabilitation

## 2022-02-27 VITALS — BP 118/76 | HR 71 | Ht 62.5 in | Wt 222.4 lb

## 2022-02-27 DIAGNOSIS — G8111 Spastic hemiplegia affecting right dominant side: Secondary | ICD-10-CM | POA: Diagnosis not present

## 2022-02-27 NOTE — Patient Instructions (Signed)
Please do toe stretching exercise as discussed every day

## 2022-02-27 NOTE — Progress Notes (Signed)
Subjective:    Patient ID: Terri Werner, female    DOB: 15-Jan-1951, 71 y.o.   MRN: [id number]  HPI  71 year old female with right spastic hemiplegia secondary left CVA.  She is ~6 weeks post botulinum toxin injection under EMG guidance.  She feels like her right lower extremity spasticity has improved.  She still has significant toe flexor spasticity particularly in the big toe.  This impedes her walking.  She had no postinjection complications.  She has not been doing any toe stretching exercises.  She goes to physical therapy and they are mainly working on gait.  She is not using her AFO is much since her Botox injection and feels like she can clear her toe pretty well 01/09/22 RIGHT FHL 75 2+ FDL 75 2+ PT 50 1+ Med Gastr 50 2+ Lat Gastroc 0 Waste 50U  Pain Inventory Average Pain 7 Pain Right Now 8 My pain is dull and aching  In the last 24 hours, has pain interfered with the following? General activity 6 Relation with others 5 Enjoyment of life 5 What TIME of day is your pain at its worst? daytime Sleep (in general) Good  Pain is worse with: walking Pain improves with: heat/ice Relief from Meds: 6  Family History  Problem Relation Age of Onset   Breast cancer Sister 65   Hypertension Mother    Alzheimer's disease Mother    Dementia Mother    Hypertension Maternal Grandfather    Hyperlipidemia Maternal Grandfather    Birth defects Maternal Grandmother        Bladder   Colon cancer Maternal Grandmother    Congestive Heart Failure Father    Hyperlipidemia Father    Dementia Paternal Grandmother    Dementia Paternal Grandfather    Thyroid disease Sister    Social History   Socioeconomic History   Marital status: Widowed    Spouse name: Not on file   Number of children: 2   Years of education: Not on file   Highest education level: Master's degree (e.g., MA, MS, MEng, MEd, MSW, MBA)  Occupational History   Occupation: H[employer]    Comment: Full Time    Employer: [employer]  Tobacco Use   Smoking status: Never   Smokeless tobacco: Never  Vaping Use   Vaping Use: Never used  Substance and Sexual Activity   Alcohol use: Not Currently    Comment: rare   Drug use: No   Sexual activity: Not Currently    Partners: Male  Other Topics Concern   Not on file  Social History Narrative   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      Regular Exercise -  NO   Daily Caffeine Use:  1 soda/tea          Social Determinants of Health   Financial Resource Strain: High Risk (03/05/2018)   Overall Financial Resource Strain (CARDIA)    Difficulty of Paying Living Expenses: Hard  Food Insecurity: No Food Insecurity (03/05/2018)   Hunger Vital Sign    Worried About Running Out of Food in the Last Year: Never true    Ran Out of Food in the Last Year: Never true  Transportation Needs: No Transportation Needs (03/05/2018)   PRAPARE - Administrator, Civil Service (Medical): No    Lack of Transportation (Non-Medical): No  Physical Activity: Insufficiently Active (03/05/2018)   Exercise Vital Sign    Days of Exercise per Week: 3 days    Minutes of Exercise per Session: 20 min  Stress: No Stress Concern Present (03/05/2018)   [employer] of Occupational Health - Occupational Stress Questionnaire    Feeling of Stress : Not at all  Social Connections: Moderately Integrated (03/05/2018)   Social Connection and Isolation Panel [NHANES]    Frequency of Communication with Friends and Family: More than three times a week    Frequency of Social Gatherings with Friends and Family: Twice a week    Attends Religious Services: More than 4 times per year    Active Member of [employer] or Organizations: Yes    Attends Banker Meetings: More than 4 times per year    Marital Status: Widowed    Past Surgical History:  Procedure Laterality Date   ABDOMINAL HYSTERECTOMY  2006   APPENDECTOMY  1980   CESAREAN SECTION  1981   COLONOSCOPY WITH PROPOFOL N/A 11/01/2014   Procedure: COLONOSCOPY WITH PROPOFOL;  Surgeon: Scot Jun, MD;  Location: [facility] ENDOSCOPY;  Service: Endoscopy;  Laterality: N/A;   COLONOSCOPY WITH PROPOFOL N/A 01/05/2020   Procedure: COLONOSCOPY WITH PROPOFOL;  Surgeon: Regis Bill, MD;  Location: [facility] ENDOSCOPY;  Service: Endoscopy;  Laterality: N/A;   CYSTOSCOPY WITH BIOPSY N/A 02/19/2022   Procedure: CYSTOSCOPY WITH BLADDER BIOPSY;  Surgeon: Vanna Scotland, MD;  Location: [facility] ORS;  Service: Urology;  Laterality: N/A;   ECTOPIC PREGNANCY SURGERY  1980   HERNIA REPAIR  2006   Umbilical   KNEE ARTHROSCOPY Left 2006   torn meniscus   TONSILLECTOMY  1971   TOTAL ABDOMINAL HYSTERECTOMY W/ BILATERAL SALPINGOOPHORECTOMY  2006   VESICOVAGINAL FISTULA CLOSURE W/ TAH     Past Surgical History:  Procedure Laterality Date   ABDOMINAL HYSTERECTOMY  2006   APPENDECTOMY  1980   CESAREAN SECTION  1981   COLONOSCOPY WITH PROPOFOL N/A 11/01/2014   Procedure: COLONOSCOPY WITH PROPOFOL;  Surgeon: Scot Jun, MD;  Location: [facility] ENDOSCOPY;  Service: Endoscopy;  Laterality: N/A;   COLONOSCOPY WITH PROPOFOL N/A 01/05/2020   Procedure: COLONOSCOPY WITH PROPOFOL;  Surgeon: Regis Bill, MD;  Location: [facility] ENDOSCOPY;  Service: Endoscopy;  Laterality: N/A;   CYSTOSCOPY WITH BIOPSY N/A 02/19/2022   Procedure: CYSTOSCOPY WITH BLADDER BIOPSY;  Surgeon: Vanna Scotland, MD;  Location: [facility] ORS;  Service: Urology;  Laterality: N/A;   ECTOPIC PREGNANCY SURGERY  1980   HERNIA REPAIR  2006   Umbilical   KNEE ARTHROSCOPY Left 2006   torn meniscus   TONSILLECTOMY  1971   TOTAL ABDOMINAL HYSTERECTOMY W/ BILATERAL SALPINGOOPHORECTOMY  2006   VESICOVAGINAL FISTULA CLOSURE W/ TAH     Past Medical History:  Diagnosis Date   Arthritis    pt reports in back & knees    Colon polyps 2010   At [facility]   Diverticulitis 2016   Genital warts    GERD (gastroesophageal reflux disease)    Heart murmur    Hepatitis B    History of blood transfusion    during each major surgery per pt   History of chicken pox    History of hiatal hernia    History of torn meniscus of right knee    HTN (hypertension)    Hx of migraine headaches    Hypertension    Hypopotassemia    Pre-diabetes    Stroke (HCC) 05/27/2021   BP 118/76  Pulse 71   Ht 5' 2.5" (1.588 m)   Wt 222 lb 6.4 oz (100.9 kg)   SpO2 98%   BMI 40.03 kg/m   Opioid Risk Score:   Fall Risk Score:  `1  Depression screen [facility] 2/9     02/27/2022    2:43 PM 12/21/2021   11:05 AM 11/23/2021   11:51 AM 11/06/2021   11:24 AM 10/06/2021   10:19 AM 09/19/2021   11:02 AM 08/24/2021   10:48 AM  Depression screen PHQ 2/9  Decreased Interest 0 0 0 0 0 0 0  Down, Depressed, Hopeless 0 0 0 0 0 1 0  PHQ - 2 Score 0 0 0 0 0 1 0  Altered sleeping  0  0  0   Tired, decreased energy  0  0  0   Change in appetite  0  0  0   Feeling bad or failure about yourself   0  0  0   Trouble concentrating  0  0  0   Moving slowly or fidgety/restless  0  0  0   Suicidal thoughts  0  0  0   PHQ-9 Score  0  0  1   Difficult doing work/chores  Not difficult at all  Not difficult at all  Not difficult at all       Review of Systems  Musculoskeletal:  Positive for back pain.       Right knee pain Right ankle pain  All other systems reviewed and are negative.     Objective:   Physical Exam   No acute distress Mood and affect are appropriate Extremities without edema Tone with standing the patient goes into mild foot inversion but foot stays plantigrade.  She does have toe curling of the great toe greater than the smaller toes. No significant plantar flexor spasticity no clonus. Ambulates with a cane no AFO she is able to clear her foot without excessive hip hiking.     Assessment & Plan:  #1.  Right spastic  hemiplegia stent secondary to left CVA.  Tone interferes with ambulation and persist despite adequate inpatient and outpatient rehabilitation.  Given her age and balance problems trying to avoid muscle relaxants. We will make following adjustments to her botulinum toxin injection.  Xeomin 50 units/mL RIGHT FHL 75 2+ FDL 75 2+ PT 75 1+ Med Gastr 50 2+ FHB25

## 2022-02-28 ENCOUNTER — Ambulatory Visit: Payer: Medicare Other | Admitting: Urology

## 2022-02-28 ENCOUNTER — Ambulatory Visit: Payer: Medicare Other | Admitting: Physical Therapy

## 2022-02-28 ENCOUNTER — Ambulatory Visit: Payer: Medicare Other

## 2022-03-01 DIAGNOSIS — M21371 Foot drop, right foot: Secondary | ICD-10-CM | POA: Diagnosis not present

## 2022-03-01 NOTE — Progress Notes (Signed)
x   Chief Complaint  Patient presents with   Follow-up    Pt with daughter,  rm 42. Pt here for follow up visit. She states overall doing well. Interested to know if the blood has resolved she has not had any imaging completed since January when happened.     HISTORY OF PRESENT ILLNESS:  03/05/22 ALL:  Terri Werner is a 71 y.o. female here today for follow up for left frontal ICH in 05/2021. She was last seen by Terri Werner 08/2021 and doing well with exception of mild residual right leg weakness. She had been enrolled in the Saturn trial (statin versus no statin after intracerebral hemorrhage) and randomized to the no statin arm. Last LDL 96. BP has been normal. She is followed closely by PCP. She continues to work with Terri Werner. She is participating in PT. She is able to walk with cane. She did have a fall 12/11/2021. She was working with OT for driving eval and fell backward when taking pictures. NO falls since. She has a new foot drop brace.    HISTORY (copied from Terri Werner previous note)  HPI: Terri Werner is a pleasant 71 year old African-American lady seen today for initial office visit following hospital admission for intracerebral hemorrhage in January 2023.  History is obtained from the patient and review of electronic medical records and opossum reviewed pertinent available imaging films in PACS.  She has past medical history of hypertension, diverticulitis, migraines and gastroesophageal reflux disease.  She ran out of her blood pressure medications and not refilled it for a while.  She was at church when she noticed sudden onset of right leg weakness.  CT scan of the head showed acute parenchymal hemorrhage in the left frontal parasagittal region with mild cytotoxic edema but no midline shift or intraventricular extension.  CT angiogram of the head and neck did not show any large vessel stenosis occlusion or aneurysms.  NIH stroke scale on admission was 5.  ICH  score was 0.  She was admitted to the intensive care unit and required IV Cleviprex drip for blood pressure control.  CT venogram was negative for dural venous sinus thrombosis.  MRI scan of the brain showed parenchymal left frontal parasagittal hematoma with cytotoxic edema but no underlying mass lesion.  There were multiple microhemorrhages in the white matter compatible with hypertensive small vessel disease versus amyloid angiopathy.  2D echo showed normal ejection fraction of 60 to 65% with mild left ventricular hypertrophy.  EEG was normal.  LDL cholesterol is 101 mg percent.  Hemoglobin A1c was 5.4.  Patient was on Crestor 5 mg daily prior to admission.  She met criteria for inclusion in the Saturn trial (statin versus no statin after parenchymal ntracerebral hemorrhage) and was randomized to the no statin.  She was transferred to inpatient rehab where she did well and has been discharged home.  She is finished home physical and occupational therapy and plans to start outpatient therapy next week.  She is able to ambulate using a walker for long distances and a cane for indoors and short distances.  She is using an AFO.  She still has right foot drop and has stiffness and dragging of the right leg.  She states her blood pressure control has been borderline ranging from 144-315 systolic.  She has cut out all external salt in the food and has been very compliant with her medications.   REVIEW OF SYSTEMS: Out of a complete 14 system review of  symptoms, the patient complains only of the following symptoms, right lower ext weakness, right foot drop, and all other reviewed systems are negative.   ALLERGIES: Allergies  Allergen Reactions   Shrimp [Shellfish Allergy] Swelling    Lips will burn    Latex Itching    Burning     HOME MEDICATIONS: Outpatient Medications Prior to Visit  Medication Sig Dispense Refill   acetaminophen (TYLENOL) 325 MG tablet Take 2 tablets (650 mg total) by mouth every 4  (four) hours as needed for mild pain (or temp > 37.5 C (99.5 F)).     acidophilus (RISAQUAD) CAPS capsule Take 1 capsule by mouth daily. 30 capsule 0   amLODipine (NORVASC) 5 MG tablet Take 1 tablet (5 mg total) by mouth daily. 90 tablet 1   Cholecalciferol (VITAMIN D) 50 MCG (2000 UT) tablet Take 1 tablet (2,000 Units total) by mouth daily. 90 tablet 3   cyclobenzaprine (FLEXERIL) 5 MG tablet TAKE 1 TABLET BY MOUTH DAILY AS NEEDED FOR MUSCLE SPASMS. 30 tablet 1   diclofenac Sodium (VOLTAREN) 1 % GEL Apply 4 g topically 4 (four) times daily. 400 g 0   pantoprazole (PROTONIX) 40 MG tablet      polyethylene glycol powder (GLYCOLAX/MIRALAX) 17 GM/SCOOP powder Take 17 g by mouth daily as needed for moderate constipation. 3350 g 1   senna-docusate (SENOKOT-S) 8.6-50 MG tablet Take 1 tablet by mouth 2 (two) times daily. 90 tablet 3   sulfamethoxazole-trimethoprim (BACTRIM DS) 800-160 MG tablet Take 1 tablet by mouth every 12 (twelve) hours. Start 3 days prior to procedure and then continue course 14 tablet 0   methocarbamol (ROBAXIN) 500 MG tablet Take 500 mg by mouth at bedtime as needed.     valsartan-hydrochlorothiazide (DIOVAN-HCT) 80-12.5 MG tablet Take 1 tablet by mouth daily. 30 tablet 3   No facility-administered medications prior to visit.     PAST MEDICAL HISTORY: Past Medical History:  Diagnosis Date   Arthritis    pt reports in back & knees   Colon polyps 2010   At [facility]   Diverticulitis 2016   Genital warts    GERD (gastroesophageal reflux disease)    Heart murmur    Hepatitis B    History of blood transfusion    during each major surgery per pt   History of chicken pox    History of hiatal hernia    History of torn meniscus of right knee    HTN (hypertension)    Hx of migraine headaches    Hypertension    Hypopotassemia    Pre-diabetes    Stroke ([place]) 05/27/2021     PAST SURGICAL HISTORY: Past Surgical History:  Procedure Laterality Date   ABDOMINAL HYSTERECTOMY   2006   [place]   COLONOSCOPY WITH PROPOFOL N/A 11/01/2014   Procedure: COLONOSCOPY WITH PROPOFOL;  Surgeon: Manya Silvas, MD;  Location: [place];  Service: Endoscopy;  Laterality: N/A;   COLONOSCOPY WITH PROPOFOL N/A 01/05/2020   Procedure: COLONOSCOPY WITH PROPOFOL;  Surgeon: [name], MD;  Location: [facility] ENDOSCOPY;  Service: Endoscopy;  Laterality: N/A;   CYSTOSCOPY WITH BIOPSY N/A 02/19/2022   Procedure: CYSTOSCOPY WITH BLADDER BIOPSY;  Surgeon: Hollice Espy, MD;  Location: [facility] ORS;  Service: Urology;  Laterality: N/A;   [place]   HERNIA REPAIR  8250   Umbilical   KNEE ARTHROSCOPY Left 2006   torn meniscus   TONSILLECTOMY  1971  TOTAL ABDOMINAL HYSTERECTOMY W/ BILATERAL SALPINGOOPHORECTOMY  2006   VESICOVAGINAL FISTULA CLOSURE W/ TAH       FAMILY HISTORY: Family History  Problem Relation Age of Onset   Breast cancer Sister 22   Hypertension Mother    Alzheimer's disease Mother    Dementia Mother    Hypertension Maternal Grandfather    Hyperlipidemia Maternal Grandfather    Birth defects Maternal Grandmother        Bladder   Colon cancer Maternal Grandmother    Congestive Heart Failure Father    Hyperlipidemia Father    Dementia Paternal Grandmother    Dementia Paternal Grandfather    Thyroid disease Sister      SOCIAL HISTORY: Social History   Socioeconomic History   Marital status: Widowed    Spouse name: Not on file   Number of children: 2   Years of education: Not on file   Highest education level: Master's degree (e.g., MA, MS, MEng, MEd, MSW, MBA)  Occupational History   Occupation: [employer]    Comment: Full Time    Employer: [employer]  Tobacco Use   Smoking status: Never   Smokeless tobacco: Never  Vaping Use   Vaping Use: Never used  Substance and Sexual Activity   Alcohol use: Not Currently    Comment: rare   Drug  use: No   Sexual activity: Not Currently    Partners: Male  Other Topics Concern   Not on file  Social History Narrative   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      Regular Exercise -  NO   Daily Caffeine Use:  1 soda/tea          Social Determinants of Health   Financial Resource Strain: High Risk (03/05/2018)   Overall Financial Resource Strain (CARDIA)    Difficulty of Paying Living Expenses: Hard  Food Insecurity: No Food Insecurity (03/05/2018)   Hunger Vital Sign    Worried About Running Out of Food in the Last Year: Never true    Ran Out of Food in the Last Year: Never true  Transportation Needs: No Transportation Needs (03/05/2018)   PRAPARE - Hydrologist (Medical): No    Lack of Transportation (Non-Medical): No  Physical Activity: Insufficiently Active (03/05/2018)   Exercise Vital Sign    Days of Exercise per Week: 3 days    Minutes of Exercise per Session: 20 min  Stress: No Stress Concern Present (03/05/2018)   [place]    Feeling of Stress : Not at all  Social Connections: Moderately Integrated (03/05/2018)   Social Connection and Isolation Panel [NHANES]    Frequency of Communication with Friends and Family: More than three times a week    Frequency of Social Gatherings with Friends and Family: Twice a week    Attends Religious Services: More than 4 times per year    Active Member of [employer] or Organizations: Yes    Attends Archivist Meetings: More than 4 times per year    Marital Status: Widowed  Intimate Partner Violence: Not At Risk (03/05/2018)   Humiliation, Afraid, Rape, and Kick questionnaire    Fear of Current or Ex-Partner: No    Emotionally Abused: No    Physically Abused: No    Sexually Abused: No  PHYSICAL EXAM  Vitals:   03/05/22 1044   BP: 116/71  Pulse: 73  Weight: 223 lb (101.2 kg)  Height: _0  (1.575 m)   Body mass index is 40.79 kg/m.  Generalized: Well developed, in no acute distress  Cardiology: normal rate and rhythm, no murmur auscultated  Respiratory: clear to auscultation bilaterally    Neurological examination  Mentation: Alert oriented to time, place, history taking. Follows all commands speech and language fluent Cranial nerve II-XII: Pupils were equal round reactive to light. Extraocular movements were full, visual field were full on confrontational test. Facial sensation and strength were normal. Uvula tongue midline. Head turning and shoulder shrug  were normal and symmetric. Motor: The motor testing reveals 5 over 5 strength of all 4 extremities with exception of 4/5 right hip flexion. Good symmetric motor tone is noted throughout.  Sensory: Sensory testing is intact to soft touch on all 4 extremities. No evidence of extinction is noted.  Coordination: Cerebellar testing reveals good finger-nose-finger and heel-to-shin bilaterally.  Gait and station: Able to push to standing position. Hemiplegic gait with mild right foot drop. Stable with cane.  Reflexes: Deep tendon reflexes are symmetric and normal bilaterally.    DIAGNOSTIC DATA (LABS, IMAGING, TESTING) - I reviewed patient records, labs, notes, testing and imaging myself where available.  Lab Results  Component Value Date   WBC 5.5 02/13/2022   HGB 11.9 (L) 02/13/2022   HCT 36.8 02/13/2022   MCV 88.9 02/13/2022   PLT 204 02/13/2022      Component Value Date/Time   NA 141 02/13/2022 1116   NA 141 07/03/2012 1718   K 3.7 02/13/2022 1116   K 3.7 07/03/2012 1718   CL 105 02/13/2022 1116   CL 109 (H) 07/03/2012 1718   CO2 29 02/13/2022 1116   CO2 27 07/03/2012 1718   GLUCOSE 109 (H) 02/13/2022 1116   GLUCOSE 77 07/03/2012 1718   BUN 17 02/13/2022 1116   BUN 10 07/03/2012 1718   CREATININE 1.03 (H) 02/13/2022 1116   CREATININE  0.90 02/27/2021 1040   CALCIUM 9.0 02/13/2022 1116   CALCIUM 9.0 07/03/2012 1718   PROT 6.7 06/01/2021 0510   PROT 8.3 (H) 07/03/2012 1718   ALBUMIN 3.2 (L) 06/01/2021 0510   ALBUMIN 3.8 07/03/2012 1718   AST 16 06/01/2021 0510   AST 22 07/03/2012 1718   ALT 13 06/01/2021 0510   ALT 21 07/03/2012 1718   ALKPHOS 40 06/01/2021 0510   ALKPHOS 67 07/03/2012 1718   BILITOT 0.5 06/01/2021 0510   BILITOT 0.5 07/03/2012 1718   GFRNONAA 58 (L) 02/13/2022 1116   GFRNONAA 52 (L) 01/13/2019 0840   GFRAA 60 01/13/2019 0840   Lab Results  Component Value Date   CHOL 158 06/01/2021   HDL 50 06/01/2021   LDLCALC 96 06/01/2021   TRIG 60 06/01/2021   CHOLHDL 3.2 06/01/2021   Lab Results  Component Value Date   HGBA1C 5.9 (A) 02/27/2021   No results found for: "VITAMINB12" Lab Results  Component Value Date   TSH 1.80 02/27/2021        No data to display               No data to display           ASSESSMENT AND PLAN  71 y.o. year old female  has a past medical history of Arthritis, Colon polyps (2010), Diverticulitis (2016), Genital warts, GERD (gastroesophageal reflux disease), Heart murmur, Hepatitis B, History of blood  transfusion, History of chicken pox, History of hiatal hernia, History of torn meniscus of right knee, HTN (hypertension), migraine headaches, Hypertension, Hypopotassemia, Pre-diabetes, and Stroke ([place]) (05/27/2021). here with    Nontraumatic cortical hemorrhage of left cerebral hemisphere (HCC)  Weakness of right lower extremity  Mixed hyperlipidemia  Right foot drop  Terri Werner is doing well. She continues to work with [facility]/PT. Right leg weakness is significantly improved. She will continue current plan. We have reviewed BP, LDL and A1C goals. Continue participation in research trial as directed. Healthy lifestyle habits encouraged. She will follow up with PCP as directed. She will return to see me as needed. She verbalizes  understanding and agreement with this plan.   No orders of the defined types were placed in this encounter.    No orders of the defined types were placed in this encounter.    Debbora Presto, MSN, FNP-C 03/05/2022, 12:46 PM  [facility] [address] [place], [place] [address] [phone]

## 2022-03-01 NOTE — Patient Instructions (Signed)
Below is our plan:  We will continue to monitor. Continue close follow up with your PCP for management of comorbidities. We would like for your BP to stay at or below 120/80. Keep LDL (bad cholesterol) less than 70. A1C less than 7.   Please make sure you are staying well hydrated. I recommend 50-60 ounces daily. Well balanced diet and regular exercise encouraged. Consistent sleep schedule with 6-8 hours recommended.   Please continue follow up with care team as directed.   Follow up with me as needed.   You may receive a survey regarding today's visit. I encourage you to leave honest feed back as I do use this information to improve patient care. Thank you for seeing me today!

## 2022-03-05 ENCOUNTER — Ambulatory Visit: Payer: Medicare Other | Admitting: Physical Therapy

## 2022-03-05 ENCOUNTER — Ambulatory Visit (INDEPENDENT_AMBULATORY_CARE_PROVIDER_SITE_OTHER): Payer: Medicare Other | Admitting: Family Medicine

## 2022-03-05 ENCOUNTER — Encounter: Payer: Self-pay | Admitting: Family Medicine

## 2022-03-05 ENCOUNTER — Ambulatory Visit: Payer: Medicare Other

## 2022-03-05 VITALS — BP 116/71 | HR 73 | Ht 62.0 in | Wt 223.0 lb

## 2022-03-05 DIAGNOSIS — E782 Mixed hyperlipidemia: Secondary | ICD-10-CM

## 2022-03-05 DIAGNOSIS — R29898 Other symptoms and signs involving the musculoskeletal system: Secondary | ICD-10-CM

## 2022-03-05 DIAGNOSIS — M21371 Foot drop, right foot: Secondary | ICD-10-CM

## 2022-03-05 DIAGNOSIS — I611 Nontraumatic intracerebral hemorrhage in hemisphere, cortical: Secondary | ICD-10-CM | POA: Diagnosis not present

## 2022-03-06 NOTE — Therapy (Signed)
OUTPATIENT PHYSICAL THERAPY TREATMENT NOTE     Patient Name: Terri Werner MRN: [id number] DOB:1951-01-09, 71 y.o., female Today's Date: 03/07/2022  PCP: Margarita Mail DO  REFERRING PROVIDER: Erick Colace MD    PT End of Session - 03/07/22 1435     Visit Number 39    Number of Visits 42    Date for PT Re-Evaluation 04/20/22    Authorization Type BCBS Medicare    Authorization Time Period 01/26/22-04/20/22    Progress Note Due on Visit 50    PT Start Time 1435    PT Stop Time 1514    PT Time Calculation (min) 39 min    Equipment Utilized During Treatment Gait belt    Activity Tolerance Patient tolerated treatment well    Behavior During Therapy WFL for tasks assessed/performed                               Past Medical History:  Diagnosis Date   Arthritis    pt reports in back & knees   Colon polyps 2010   At [facility]   Diverticulitis 2016   Genital warts    GERD (gastroesophageal reflux disease)    Heart murmur    Hepatitis B    History of blood transfusion    during each major surgery per pt   History of chicken pox    History of hiatal hernia    History of torn meniscus of right knee    HTN (hypertension)    Hx of migraine headaches    Hypertension    Hypopotassemia    Pre-diabetes    Stroke (HCC) 05/27/2021   Past Surgical History:  Procedure Laterality Date   ABDOMINAL HYSTERECTOMY  2006   APPENDECTOMY  1980   CESAREAN SECTION  1981   COLONOSCOPY WITH PROPOFOL N/A 11/01/2014   Procedure: COLONOSCOPY WITH PROPOFOL;  Surgeon: Scot Jun, MD;  Location: [facility] ENDOSCOPY;  Service: Endoscopy;  Laterality: N/A;   COLONOSCOPY WITH PROPOFOL N/A 01/05/2020   Procedure: COLONOSCOPY WITH PROPOFOL;  Surgeon: Regis Bill, MD;  Location: [facility] ENDOSCOPY;  Service: Endoscopy;  Laterality: N/A;   CYSTOSCOPY WITH BIOPSY N/A 02/19/2022   Procedure: CYSTOSCOPY WITH BLADDER BIOPSY;  Surgeon: Vanna Scotland, MD;   Location: [facility] ORS;  Service: Urology;  Laterality: N/A;   ECTOPIC PREGNANCY SURGERY  1980   HERNIA REPAIR  2006   Umbilical   KNEE ARTHROSCOPY Left 2006   torn meniscus   TONSILLECTOMY  1971   TOTAL ABDOMINAL HYSTERECTOMY W/ BILATERAL SALPINGOOPHORECTOMY  2006   VESICOVAGINAL FISTULA CLOSURE W/ TAH     Patient Active Problem List   Diagnosis Date Noted   Hx of spontaneous intraparenchymal intracranial hemorrhage 07/12/2021   Intraparenchymal hemorrhage of brain (HCC) 05/31/2021   ICH (intracerebral hemorrhage) (HCC) 05/27/2021   Morbid obesity (HCC) 09/24/2018   Unspecified inflammatory spondylopathy, lumbar region (HCC) 03/05/2018   Lumbar spondylolysis 08/20/2017   Lumbar facet arthropathy 08/20/2017   Lumbar degenerative disc disease 08/20/2017   Chronic constipation 07/30/2017   Primary osteoarthritis involving multiple joints 04/17/2017   Postmenopausal 10/12/2016   Prediabetes 10/12/2016   Lumbago 08/04/2015   Diverticulitis of colon 07/05/2014   Carotid artery disorder (HCC) 04/27/2014   Hyperlipidemia 10/08/2013   Hypertension, benign 11/19/2009    REFERRING DIAG: R spastic hemiplegia   THERAPY DIAG:  Muscle weakness (generalized)  Unsteadiness on feet  Other low back pain  Rationale for Evaluation  and Treatment Rehabilitation  PERTINENT HISTORY: ARSHDEEP ARRIAZA is a 71 y.o. right-handed female with history of hepatitis B, arthritis, diverticulitis, GERD, hyperlipidemia, hypertension. Patient is recently retired. She has a daughter and niece in the area. Presented 05/27/2021 with sudden onset of right leg weakness and mild headache. Cranial CT scan showed acute parenchymal hemorrhage in the parasagittal left frontal parietal lobes with mild edema. No substantial mass effect. CT angiogram head and neck no abnormal vascularity in the region of hemorrhage no hemodynamically significant stenosis. MRI follow-up showed no mass effect seen underlying the left  posterior frontal hematoma. Patient admitted to inpatient rehab on 05/31/21 and discharged on 06/27/21. Patient used home health at home that stopped two weeks ago. Patient has R foot drop. Has a wheelchair she does household chores in, a RW, a quad cane and R AFO  PRECAUTIONS: fall, has latex allergy must use latex-free bands  SUBJECTIVE: Patient presents without chair today. Has been compliant with HEP.   PAIN:  Are you having pain? None  at rest.     TODAY'S TREATMENT:   ambulate across stable and unstable surface outside. Negotiating changing surfaces from grass to sidewalk, across brick with turns and obstacles in pathway without LOB.   Seated: GTB hamstring curl 15x each LE GTB alternating IR/ER 15x each LE  GTB alternating LAQ kicks 10x each LE  GTB abduction 15x  10x STS  PATIENT EDUCATION: Education details: Pt educated throughout session about proper posture and technique with exercises. Improved exercise technique, movement at target joints, use of target muscles after min to mod verbal, visual, tactile cues.  Person educated: Patient Education method: Explanation, Demonstration, and Tactile cues Education comprehension: verbalized understanding, returned demonstration, verbal cues required, and tactile cues required   HOME EXERCISE PROGRAM: Continue as previously given  Added df/pf  exercise to HEP -11/02/21   PT Short Term Goals -      PT SHORT TERM GOAL #1   Title Patient will be independent in home exercise program to improve strength/mobility for better functional independence with ADLs.    Baseline 5/4: HEP given 6/20: does HEP 2x a week 7/31: 3-4x a week   Time 4   Period Weeks    Status Achieved    Target Date 01/05/22             PT Long Term Goals       PT LONG TERM GOAL #1   Title Patient will increase FOTO score to equal to or greater than 63%    to demonstrate statistically significant improvement in mobility and quality of life.     Baseline 5/4: 48%, 6/20: 60% , 7/31: 60%;9/15: 52%   Time 12   Period Weeks    Status Partially Met    Target Date 04/20/2022     PT LONG TERM GOAL #2   Title Patient will increase 10 meter walk test to >1.72m/s as to improve gait speed for better community ambulation and to reduce fall risk.    Baseline 5/4: 0.54 m/s with RW, 6/20: 0.63 m/s 7/31: 0.57 ; with SPC; 9/15: 0.42 m/s with SPC, *without AFO (waiting for new one)   Time 12   Period Weeks    Status Partially Met    Target Date 04/20/2022     PT LONG TERM GOAL #3   Title Patient will demonstrate an improved Berg Balance Score of >45/56 as to demonstrate improved balance with ADLs such as sitting/standing and transfer balance and reduced fall  risk.    Baseline 5/4: 32/56, 6/20: 45/56 , 7/31: 49/56; 9/15: 53/56   Time 8   Period Weeks    Status Achieved;   Target Date 02/05/22     PT LONG TERM GOAL #4   Title Patient will increase BLE gross strength to 4+/5 as to improve functional strength for independent gait, increased standing tolerance and increased ADL ability.    Baseline 5/4: see note, 6/20:R knee 4/5 , 7/31: not formally assessed; 9/15: BLE strength is grossly 4/5    Time 12   Period Weeks    Status Partially Met    Target Date 04/20/2022   PT LONG TERM GOAL #5  Title  Patient will increase ABC scale score >80% to demonstrate better functional mobility and better confidence with ADLs.   Baseline 9/15:  Time 12  Period Weeks   Status NEW  Target Date 04/20/2022              Plan     Clinical Impression Statement Patient presents with excellent motivation. Tolerates progressive ambulation interventions. She has no episodes of LOB and only requires one seated rest break.   The pt will continue to benefit from skilled PT services to progress mobility, reduce risk of falls, and address and reduce LBP which is currently impacting her mobility and progress toward goals.    Personal Factors and Comorbidities  Comorbidity 3+;Fitness;Past/Current Experience;Time since onset of injury/illness/exacerbation;Transportation    Comorbidities hepatitis B, arthritis, diverticulitis, GERD, hyperlipidemia, hypertension    Examination-Activity Limitations Bed Mobility;Caring for [employer];Locomotion Level;Lift;Squat;Stairs;Stand;Toileting;Transfers    Examination-Participation Restrictions Cleaning;Driving;Meal Prep;Laundry;Shop;Volunteer;Yard Work    Conservation officer, historic buildings Evolving/Moderate complexity    Rehab Potential Fair    PT Frequency 1x / week    PT Duration 12 weeks    PT Treatment/Interventions ADLs/Self Care Home Management;Canalith Repostioning;Cryotherapy;Electrical Stimulation;Iontophoresis 4mg /ml Dexamethasone;Moist Heat;Traction;Ultrasound;Functional mobility training;Stair training;Gait training;DME Instruction;Therapeutic activities;Therapeutic exercise;Balance training;Neuromuscular re-education;Manual techniques;Orthotic Fit/Training;Patient/family education;Passive range of motion;Dry needling;Taping;Splinting;Energy conservation;Visual/perceptual remediation/compensation;Vestibular    PT Next Visit Plan progress stabilization and strength    PT Home Exercise Plan see above    Consulted and Agree with Plan of Care Patient             Precious Bard PT  3:15 PM, 03/07/22  Physical Therapist - [facility]  Outpatient Physical Therapy- Main Campus [phone]

## 2022-03-07 ENCOUNTER — Ambulatory Visit: Payer: Medicare Other | Admitting: Physical Therapy

## 2022-03-07 ENCOUNTER — Ambulatory Visit: Payer: Medicare Other

## 2022-03-07 DIAGNOSIS — M5459 Other low back pain: Secondary | ICD-10-CM

## 2022-03-07 DIAGNOSIS — M6281 Muscle weakness (generalized): Secondary | ICD-10-CM | POA: Diagnosis not present

## 2022-03-07 DIAGNOSIS — R269 Unspecified abnormalities of gait and mobility: Secondary | ICD-10-CM | POA: Diagnosis not present

## 2022-03-07 DIAGNOSIS — R2681 Unsteadiness on feet: Secondary | ICD-10-CM | POA: Diagnosis not present

## 2022-03-07 DIAGNOSIS — R278 Other lack of coordination: Secondary | ICD-10-CM | POA: Diagnosis not present

## 2022-03-07 DIAGNOSIS — R262 Difficulty in walking, not elsewhere classified: Secondary | ICD-10-CM | POA: Diagnosis not present

## 2022-03-12 ENCOUNTER — Ambulatory Visit: Payer: Medicare Other

## 2022-03-12 ENCOUNTER — Ambulatory Visit: Payer: Medicare Other | Admitting: Physical Therapy

## 2022-03-12 DIAGNOSIS — R269 Unspecified abnormalities of gait and mobility: Secondary | ICD-10-CM | POA: Diagnosis not present

## 2022-03-12 DIAGNOSIS — M5459 Other low back pain: Secondary | ICD-10-CM | POA: Diagnosis not present

## 2022-03-12 DIAGNOSIS — R2681 Unsteadiness on feet: Secondary | ICD-10-CM

## 2022-03-12 DIAGNOSIS — R262 Difficulty in walking, not elsewhere classified: Secondary | ICD-10-CM | POA: Diagnosis not present

## 2022-03-12 DIAGNOSIS — R278 Other lack of coordination: Secondary | ICD-10-CM

## 2022-03-12 DIAGNOSIS — M6281 Muscle weakness (generalized): Secondary | ICD-10-CM | POA: Diagnosis not present

## 2022-03-12 NOTE — Therapy (Signed)
OUTPATIENT PHYSICAL THERAPY TREATMENT NOTE/PROGRESS NOTE  Dates of reporting period: 01/26/22 - 03/12/22     Patient Name: Terri Werner MRN: [id number] DOB:06-07-1950, 71 y.o., female Today's Date: 03/12/2022  PCP: Margarita Mail DO  REFERRING PROVIDER: Erick Colace MD    PT End of Session - 03/12/22 1519     Visit Number 40    Number of Visits 42    Date for PT Re-Evaluation 04/20/22    Authorization Type BCBS Medicare    Authorization Time Period 01/26/22-04/20/22    Progress Note Due on Visit 50    PT Start Time 1516    PT Stop Time 1601    PT Time Calculation (min) 45 min    Equipment Utilized During Treatment Gait belt    Activity Tolerance Patient tolerated treatment well    Behavior During Therapy WFL for tasks assessed/performed                 Past Medical History:  Diagnosis Date   Arthritis    pt reports in back & knees   Colon polyps 2010   At [facility]   Diverticulitis 2016   Genital warts    GERD (gastroesophageal reflux disease)    Heart murmur    Hepatitis B    History of blood transfusion    during each major surgery per pt   History of chicken pox    History of hiatal hernia    History of torn meniscus of right knee    HTN (hypertension)    Hx of migraine headaches    Hypertension    Hypopotassemia    Pre-diabetes    Stroke (HCC) 05/27/2021   Past Surgical History:  Procedure Laterality Date   ABDOMINAL HYSTERECTOMY  2006   APPENDECTOMY  1980   CESAREAN SECTION  1981   COLONOSCOPY WITH PROPOFOL N/A 11/01/2014   Procedure: COLONOSCOPY WITH PROPOFOL;  Surgeon: Scot Jun, MD;  Location: [facility] ENDOSCOPY;  Service: Endoscopy;  Laterality: N/A;   COLONOSCOPY WITH PROPOFOL N/A 01/05/2020   Procedure: COLONOSCOPY WITH PROPOFOL;  Surgeon: Regis Bill, MD;  Location: [facility] ENDOSCOPY;  Service: Endoscopy;  Laterality: N/A;   CYSTOSCOPY WITH BIOPSY N/A 02/19/2022   Procedure: CYSTOSCOPY WITH BLADDER BIOPSY;   Surgeon: Vanna Scotland, MD;  Location: [facility] ORS;  Service: Urology;  Laterality: N/A;   ECTOPIC PREGNANCY SURGERY  1980   HERNIA REPAIR  2006   Umbilical   KNEE ARTHROSCOPY Left 2006   torn meniscus   TONSILLECTOMY  1971   TOTAL ABDOMINAL HYSTERECTOMY W/ BILATERAL SALPINGOOPHORECTOMY  2006   VESICOVAGINAL FISTULA CLOSURE W/ TAH     Patient Active Problem List   Diagnosis Date Noted   Hx of spontaneous intraparenchymal intracranial hemorrhage 07/12/2021   Intraparenchymal hemorrhage of brain (HCC) 05/31/2021   ICH (intracerebral hemorrhage) (HCC) 05/27/2021   Morbid obesity (HCC) 09/24/2018   Unspecified inflammatory spondylopathy, lumbar region (HCC) 03/05/2018   Lumbar spondylolysis 08/20/2017   Lumbar facet arthropathy 08/20/2017   Lumbar degenerative disc disease 08/20/2017   Chronic constipation 07/30/2017   Primary osteoarthritis involving multiple joints 04/17/2017   Postmenopausal 10/12/2016   Prediabetes 10/12/2016   Lumbago 08/04/2015   Diverticulitis of colon 07/05/2014   Carotid artery disorder (HCC) 04/27/2014   Hyperlipidemia 10/08/2013   Hypertension, benign 11/19/2009    REFERRING DIAG: R spastic hemiplegia   THERAPY DIAG:  Muscle weakness (generalized)  Unsteadiness on feet  Other lack of coordination  Difficulty in walking, not elsewhere classified  Abnormality  of gait and mobility  Rationale for Evaluation and Treatment Rehabilitation  PERTINENT HISTORY: Terri Werner is a 71 y.o. right-handed female with history of hepatitis B, arthritis, diverticulitis, GERD, hyperlipidemia, hypertension. Patient is recently retired. She has a daughter and niece in the area. Presented 05/27/2021 with sudden onset of right leg weakness and mild headache. Cranial CT scan showed acute parenchymal hemorrhage in the parasagittal left frontal parietal lobes with mild edema. No substantial mass effect. CT angiogram head and neck no abnormal vascularity in the  region of hemorrhage no hemodynamically significant stenosis. MRI follow-up showed no mass effect seen underlying the left posterior frontal hematoma. Patient admitted to inpatient rehab on 05/31/21 and discharged on 06/27/21. Patient used home health at home that stopped two weeks ago. Patient has R foot drop. Has a wheelchair she does household chores in, a RW, a quad cane and R AFO  PRECAUTIONS: fall, has latex allergy must use latex-free bands  SUBJECTIVE: Patient presents with SPC. Reports some mild fatigue from working at home. Denies any falls.   PAIN:  Are you having pain? None  at rest.     TODAY'S TREATMENT: There.ex:  Reassessment of goals as pt on 40th visit warranting progress note. See goals section for details.   FOTO: 52%  ABC Scales: 80.6%  Standing at balance bar and 4# AW's:   X10 mini squats working on power   X10, x7 alternating hip extension/LE. VC's for upright posture.   Seated LAQ with 4# AW's: x10/LE   Seated hip flexion + abduction: x10/LE  Neuro Re-ed: 22 minutes outside CGA provided with focus on increasing gait speed, asc/desc inclines/declines to improve hip/knee extension power to work towards LTG's, navigating unstable surfaces (grass, curbs, unlevel cobble stones, pine straw inclines. X2 seated rest breaks throughout bout.     PATIENT EDUCATION: Education details: Pt educated throughout session about proper posture and technique with exercises. Improved exercise technique, movement at target joints, use of target muscles after min to mod verbal, visual, tactile cues.  Person educated: Patient Education method: Explanation, Demonstration, and Tactile cues Education comprehension: verbalized understanding, returned demonstration, verbal cues required, and tactile cues required   HOME EXERCISE PROGRAM: Continue as previously given  Added df/pf  exercise to HEP -11/02/21   PT Short Term Goals -      PT SHORT TERM GOAL #1   Title Patient will  be independent in home exercise program to improve strength/mobility for better functional independence with ADLs.    Baseline 5/4: HEP given 6/20: does HEP 2x a week 7/31: 3-4x a week   Time 4   Period Weeks    Status Achieved    Target Date 01/05/22             PT Long Term Goals       PT LONG TERM GOAL #1   Title Patient will increase FOTO score to equal to or greater than 63%    to demonstrate statistically significant improvement in mobility and quality of life.    Baseline 5/4: 48%, 6/20: 60% , 7/31: 60%;9/15: 52%; 03/12/22: 52%   Time 12   Period Weeks    Status Partially Met    Target Date 04/20/2022     PT LONG TERM GOAL #2   Title Patient will increase 10 meter walk test to >1.57m/s as to improve gait speed for better community ambulation and to reduce fall risk.    Baseline 5/4: 0.54 m/s with RW, 6/20: 0.63 m/s 7/31:  0.57 ; with SPC; 9/15: 0.42 m/s with SPC, *without AFO (waiting for new one); 03/12/22: 0.56 m/s with new AFO and SPC.   Time 12   Period Weeks    Status Partially Met    Target Date 04/20/2022     PT LONG TERM GOAL #3   Title Patient will demonstrate an improved Berg Balance Score of >45/56 as to demonstrate improved balance with ADLs such as sitting/standing and transfer balance and reduced fall risk.    Baseline 5/4: 32/56, 6/20: 45/56 , 7/31: 49/56; 9/15: 53/56   Time 8   Period Weeks    Status Achieved;   Target Date 02/05/22     PT LONG TERM GOAL #4   Title Patient will increase BLE gross strength to 4+/5 as to improve functional strength for independent gait, increased standing tolerance and increased ADL ability.    Baseline 5/4: see note, 6/20:R knee 4/5 , 7/31: not formally assessed; 9/15: BLE strength is grossly 4/5; 03/12/22: deferred to primary PT due to intra- rater reliability    Time 12   Period Weeks    Status Partially Met    Target Date 04/20/2022   PT LONG TERM GOAL #5  Title  Patient will increase ABC scale score >80% to  demonstrate better functional mobility and better confidence with ADLs.   Baseline 9/15: NT. 03/12/22: 80.6%  Time 12  Period Weeks   Status ACHIEVED  Target Date 04/20/2022              Plan     Clinical Impression Statement Pt on 40th visit. Goals reassessed. Pt remains limited in gait speed indicating increased risk for falls. Pt ISQ with FOTO score and scoring > 80% on ABC scale. Focus of treatment session remained on power production with hip/knee extension to improve progress towards remaining goals. Patient's condition has the potential to improve in response to therapy. Maximum improvement is yet to be obtained. The anticipated improvement is attainable and reasonable in a generally predictable time. Will continue PT POC to address remaining deficits to reduce risk of falls.      Personal Factors and Comorbidities Comorbidity 3+;Fitness;Past/Current Experience;Time since onset of injury/illness/exacerbation;Transportation    Comorbidities hepatitis B, arthritis, diverticulitis, GERD, hyperlipidemia, hypertension    Examination-Activity Limitations Bed Mobility;Caring for [employer];Locomotion Level;Lift;Squat;Stairs;Stand;Toileting;Transfers    Examination-Participation Restrictions Cleaning;Driving;Meal Prep;Laundry;Shop;Volunteer;Yard Work    Conservation officer, historic buildings Evolving/Moderate complexity    Rehab Potential Fair    PT Frequency 1x / week    PT Duration 12 weeks    PT Treatment/Interventions ADLs/Self Care Home Management;Canalith Repostioning;Cryotherapy;Electrical Stimulation;Iontophoresis 4mg /ml Dexamethasone;Moist Heat;Traction;Ultrasound;Functional mobility training;Stair training;Gait training;DME Instruction;Therapeutic activities;Therapeutic exercise;Balance training;Neuromuscular re-education;Manual techniques;Orthotic Fit/Training;Patient/family education;Passive range of motion;Dry needling;Taping;Splinting;Energy  conservation;Visual/perceptual remediation/compensation;Vestibular    PT Next Visit Plan progress stabilization and strength    PT Home Exercise Plan see above    Consulted and Agree with Plan of Care Patient             Delphia Grates. Fairly IV, PT, DPT Physical Therapist- [place]  [facility]  4:02 PM, 03/12/22

## 2022-03-14 ENCOUNTER — Encounter: Payer: Self-pay | Admitting: Urology

## 2022-03-14 ENCOUNTER — Ambulatory Visit (INDEPENDENT_AMBULATORY_CARE_PROVIDER_SITE_OTHER): Payer: Medicare Other | Admitting: Urology

## 2022-03-14 ENCOUNTER — Ambulatory Visit: Payer: Medicare Other | Admitting: Physical Therapy

## 2022-03-14 VITALS — BP 145/80 | HR 79 | Ht 62.0 in | Wt 225.0 lb

## 2022-03-14 DIAGNOSIS — N321 Vesicointestinal fistula: Secondary | ICD-10-CM | POA: Diagnosis not present

## 2022-03-14 NOTE — Progress Notes (Unsigned)
03/14/2022 2:45 PM   Terri Werner 1950-08-31 [id number]  Referring provider: Margarita Mail, DO [address] [place],  [place] [address]  Chief Complaint  Patient presents with   Follow-up    Post vesicointestinal fistula    HPI:    PMH: Past Medical History:  Diagnosis Date   Arthritis    pt reports in back & knees   Colon polyps 2010   At [facility]   Diverticulitis 2016   Genital warts    GERD (gastroesophageal reflux disease)    Heart murmur    Hepatitis B    History of blood transfusion    during each major surgery per pt   History of chicken pox    History of hiatal hernia    History of torn meniscus of right knee    HTN (hypertension)    Hx of migraine headaches    Hypertension    Hypopotassemia    Pre-diabetes    Stroke (HCC) 05/27/2021    Surgical History: Past Surgical History:  Procedure Laterality Date   ABDOMINAL HYSTERECTOMY  2006   APPENDECTOMY  1980   CESAREAN SECTION  1981   COLONOSCOPY WITH PROPOFOL N/A 11/01/2014   Procedure: COLONOSCOPY WITH PROPOFOL;  Surgeon: Scot Jun, MD;  Location[facility] END[facility]COPY;  Service: Endoscopy;  Laterality: N/A;   COLONOSCOPY WITH PROPOFOL N/A 01/05/2020   Procedure: COLONOSCOPY WITH PROPOFOL;  Surgeon: Regis Bill, MD;  Location: [facility] ENDOSCOPY;  Service: Endoscopy;  Laterality: N/A;   CYSTOSCOPY WITH BIOPSY N/A 02/19/2022   Procedure: CYSTOSCOPY WITH BLADDER BIOPSY;  Surgeon: Vanna Scotland, MD;  Location: [facility] ORS;  Service: Urology;  Laterality: N/A;   ECTOPIC PREGNANCY SURGERY  1980   HERNIA REPAIR  2006   Umbilical   KNEE ARTHROSCOPY Left 2006   torn meniscus   TONSILLECTOMY  1971   TOTAL ABDOMINAL HYSTERECTOMY W/ BILATERAL SALPINGOOPHORECTOMY  2006   VESICOVAGINAL FISTULA CLOSURE W/ TAH      Home Medications:  Allergies as of 03/14/2022       Reactions   Shrimp [shellfish Allergy] Swelling   Lips will burn   Latex Itching   Burning         Medication List        Accurate as of March 14, 2022  2:45 PM. If you have any questions, ask your nurse or doctor.          acetaminophen 325 MG tablet Commonly known as: TYLENOL Take 2 tablets (650 mg total) by mouth every 4 (four) hours as needed for mild pain (or temp > 37.5 C (99.5 F)).   acidophilus Caps capsule Take 1 capsule by mouth daily.   amLODipine 5 MG tablet Commonly known as: NORVASC Take 1 tablet (5 mg total) by mouth daily.   cyclobenzaprine 5 MG tablet Commonly known as: FLEXERIL TAKE 1 TABLET BY MOUTH DAILY AS NEEDED FOR MUSCLE SPASMS.   diclofenac Sodium 1 % Gel Commonly known as: VOLTAREN Apply 4 g topically 4 (four) times daily.   methocarbamol 500 MG tablet Commonly known as: ROBAXIN Take 500 mg by mouth at bedtime as needed.   pantoprazole 40 MG tablet Commonly known as: PROTONIX   polyethylene glycol powder 17 GM/SCOOP powder Commonly known as: GLYCOLAX/MIRALAX Take 17 g by mouth daily as needed for moderate constipation.   senna-docusate 8.6-50 MG tablet Commonly known as: Senokot-S Take 1 tablet by mouth 2 (two) times daily.   sulfamethoxazole-trimethoprim 800-160 MG tablet Commonly known as: BACTRIM  DS Take 1 tablet by mouth every 12 (twelve) hours. Start 3 days prior to procedure and then continue course   Vitamin D 50 MCG (2000 UT) tablet Take 1 tablet (2,000 Units total) by mouth daily.        Allergies:  Allergies  Allergen Reactions   Shrimp [Shellfish Allergy] Swelling    Lips will burn    Latex Itching    Burning    Family History: Family History  Problem Relation Age of Onset   Breast cancer Sister 76   Hypertension Mother    Alzheimer's disease Mother    Dementia Mother    Hypertension Maternal Grandfather    Hyperlipidemia Maternal Grandfather    Birth defects Maternal Grandmother        Bladder   Colon cancer Maternal Grandmother    Congestive Heart Failure Father    Hyperlipidemia Father     Dementia Paternal Grandmother    Dementia Paternal Grandfather    Thyroid disease Sister     Social History:  reports that she has never smoked. She has never used smokeless tobacco. She reports that she does not currently use alcohol. She reports that she does not use drugs.   Physical Exam: BP (!) 145/80   Pulse 79   Ht 5\' 2"  (1.575 m)   Wt 225 lb (102.1 kg)   BMI 41.15 kg/m   Constitutional:  Alert and oriented, No acute distress. HEENT: [place] AT, moist mucus membranes.  Trachea midline, no masses. Cardiovascular: No clubbing, cyanosis, or edema. Respiratory: Normal respiratory effort, no increased work of breathing. GI: Abdomen is soft, nontender, nondistended, no abdominal masses GU: No CVA tenderness Skin: No rashes, bruises or suspicious lesions. Neurologic: Grossly intact, no focal deficits, moving all 4 extremities. Psychiatric: Normal mood and affect.  Laboratory Data: Lab Results  Component Value Date   WBC 5.5 02/13/2022   HGB 11.9 (L) 02/13/2022   HCT 36.8 02/13/2022   MCV 88.9 02/13/2022   PLT 204 02/13/2022    Lab Results  Component Value Date   CREATININE 1.03 (H) 02/13/2022    No results found for: "PSA"  No results found for: "TESTOSTERONE"  Lab Results  Component Value Date   HGBA1C 5.9 (A) 02/27/2021    Urinalysis    Component Value Date/Time   COLORURINE YELLOW 05/28/2021 1908   APPEARANCEUR Clear 01/31/2022 1045   LABSPEC 1.025 05/28/2021 1908   PHURINE 5.5 05/28/2021 1908   GLUCOSEU Negative 01/31/2022 1045   HGBUR SMALL (A) 05/28/2021 1908   BILIRUBINUR Negative 01/31/2022 1045   KETONESUR NEGATIVE 05/28/2021 1908   PROTEINUR Negative 01/31/2022 1045   PROTEINUR 30 (A) 05/28/2021 1908   UROBILINOGEN 0.2 02/27/2021 1001   NITRITE Negative 01/31/2022 1045   NITRITE POSITIVE (A) 05/28/2021 1908   LEUKOCYTESUR Trace (A) 01/31/2022 1045   LEUKOCYTESUR LARGE (A) 05/28/2021 1908    Lab Results  Component Value Date   LABMICR See  below: 01/31/2022   WBCUA 6-10 (A) 01/31/2022   LABEPIT 0-10 01/31/2022   BACTERIA Many (A) 01/31/2022    Pertinent Imaging: *** No results found for this or any previous visit.  No results found for this or any previous visit.  No results found for this or any previous visit.  No results found for this or any previous visit.  No results found for this or any previous visit.  No valid procedures specified. No results found for this or any previous visit.  No results found for this or any previous  visit.   Assessment & Plan:    There are no diagnoses linked to this encounter.  No follow-ups on file.  Vanna Scotland, MD  [facility] [address], [address] [place], [place] [address] [phone]

## 2022-03-15 ENCOUNTER — Ambulatory Visit: Payer: Medicare Other

## 2022-03-19 ENCOUNTER — Ambulatory Visit: Payer: Medicare Other | Admitting: Physical Therapy

## 2022-03-20 ENCOUNTER — Ambulatory Visit: Payer: Medicare Other

## 2022-03-21 ENCOUNTER — Ambulatory Visit: Payer: Medicare Other | Admitting: Physical Therapy

## 2022-03-21 ENCOUNTER — Ambulatory Visit: Payer: Medicare Other | Attending: Physical Medicine & Rehabilitation

## 2022-03-21 DIAGNOSIS — R262 Difficulty in walking, not elsewhere classified: Secondary | ICD-10-CM | POA: Insufficient documentation

## 2022-03-21 DIAGNOSIS — R278 Other lack of coordination: Secondary | ICD-10-CM | POA: Insufficient documentation

## 2022-03-21 DIAGNOSIS — M6281 Muscle weakness (generalized): Secondary | ICD-10-CM | POA: Diagnosis not present

## 2022-03-21 DIAGNOSIS — R2681 Unsteadiness on feet: Secondary | ICD-10-CM | POA: Diagnosis not present

## 2022-03-21 DIAGNOSIS — R269 Unspecified abnormalities of gait and mobility: Secondary | ICD-10-CM | POA: Diagnosis not present

## 2022-03-21 NOTE — Therapy (Signed)
OUTPATIENT PHYSICAL THERAPY TREATMENT NOTE   Patient Name: Terri Werner MRN: [id number] DOB:[birth year], 71 y.o., female Today's Date: 03/21/2022  PCP: Terri Mail DO  REFERRING PROVIDER: Erick Colace MD    PT End of Session - 03/21/22 1009     Visit Number 41    Number of Visits 42    Date for PT Re-Evaluation 04/20/22    Authorization Type BCBS Medicare    Authorization Time Period 01/26/22-04/20/22    Progress Note Due on Visit 50    PT Start Time 1006    PT Stop Time 1055    PT Time Calculation (min) 49 min    Equipment Utilized During Treatment Gait belt    Activity Tolerance Patient tolerated treatment well    Behavior During Therapy WFL for tasks assessed/performed                  Past Medical History:  Diagnosis Date   Arthritis    pt reports in back & knees   Colon polyps 2010   At [facility]   Diverticulitis 2016   Genital warts    GERD (gastroesophageal reflux disease)    Heart murmur    Hepatitis B    History of blood transfusion    during each major surgery per pt   History of chicken pox    History of hiatal hernia    History of torn meniscus of right knee    HTN (hypertension)    Hx of migraine headaches    Hypertension    Hypopotassemia    Pre-diabetes    Stroke (HCC) 05/27/2021   Past Surgical History:  Procedure Laterality Date   ABDOMINAL HYSTERECTOMY  2006   APPENDECTOMY  1980   CESAREAN SECTION  1981   COLONOSCOPY WITH PROPOFOL N/A 11/01/2014   Procedure: COLONOSCOPY WITH PROPOFOL;  Surgeon: Terri Jun, MD;  Location: [facility] ENDOSCOPY;  Service: Endoscopy;  Laterality: N/A;   COLONOSCOPY WITH PROPOFOL N/A 01/05/2020   Procedure: COLONOSCOPY WITH PROPOFOL;  Surgeon: Terri Bill, MD;  Location: [facility] ENDOSCOPY;  Service: Endoscopy;  Laterality: N/A;   CYSTOSCOPY WITH BIOPSY N/A 02/19/2022   Procedure: CYSTOSCOPY WITH BLADDER BIOPSY;  Surgeon: Terri Scotland, MD;  Location: [facility] ORS;  Service: Urology;   Laterality: N/A;   ECTOPIC PREGNANCY SURGERY  1980   HERNIA REPAIR  2006   Umbilical   KNEE ARTHROSCOPY Left 2006   torn meniscus   TONSILLECTOMY  1971   TOTAL ABDOMINAL HYSTERECTOMY W/ BILATERAL SALPINGOOPHORECTOMY  2006   VESICOVAGINAL FISTULA CLOSURE W/ TAH     Patient Active Problem List   Diagnosis Date Noted   Hx of spontaneous intraparenchymal intracranial hemorrhage 07/12/2021   Intraparenchymal hemorrhage of brain (HCC) 05/31/2021   ICH (intracerebral hemorrhage) (HCC) 05/27/2021   Morbid obesity (HCC) 09/24/2018   Unspecified inflammatory spondylopathy, lumbar region (HCC) 03/05/2018   Lumbar spondylolysis 08/20/2017   Lumbar facet arthropathy 08/20/2017   Lumbar degenerative disc disease 08/20/2017   Chronic constipation 07/30/2017   Primary osteoarthritis involving multiple joints 04/17/2017   Postmenopausal 10/12/2016   Prediabetes 10/12/2016   Lumbago 08/04/2015   Diverticulitis of colon 07/05/2014   Carotid artery disorder (HCC) 04/27/2014   Hyperlipidemia 10/08/2013   Hypertension, benign 11/19/2009    REFERRING DIAG: R spastic hemiplegia   THERAPY DIAG:  Muscle weakness (generalized)  Unsteadiness on feet  Other lack of coordination  Difficulty in walking, not elsewhere classified  Abnormality of gait and mobility  Rationale for Evaluation and Treatment  Rehabilitation  PERTINENT HISTORY: Terri Werner is a 71 y.o. right-handed female with history of hepatitis B, arthritis, diverticulitis, GERD, hyperlipidemia, hypertension. Patient is recently retired. She has a daughter and niece in the area. Presented 05/27/2021 with sudden onset of right leg weakness and mild headache. Cranial CT scan showed acute parenchymal hemorrhage in the parasagittal left frontal parietal lobes with mild edema. No substantial mass effect. CT angiogram head and neck no abnormal vascularity in the region of hemorrhage no hemodynamically significant stenosis. MRI  follow-up showed no mass effect seen underlying the left posterior frontal hematoma. Patient admitted to inpatient rehab on 05/31/21 and discharged on 06/27/21. Patient used home health at home that stopped two weeks ago. Patient has R foot drop. Has a wheelchair she does household chores in, a RW, a quad cane and R AFO  PRECAUTIONS: fall, has latex allergy must use latex-free bands  SUBJECTIVE: Patient presents with SPC. Reports some mild fatigue from working at home. Denies any falls.   PAIN:  Are you having pain? None  at rest.     TODAY'S TREATMENT: There.ex:  Sit to stand with arms crossed x 10 reps (no significant difficulty- able to rise on 1st attempt each rep)    Gait speed with cane=21.03 sec  and 17.88 sec Seated LAQ with cable system at 2.5# B LE 10-12 reps; 7.5# B LE x 10 reps (patient reported as challenging) Standing Hip ext with cable system at 2.5 # BLE 12 reps x 2 sets- VC for correct technique and standing posture.  Standing Heel to toe gait sequence using cable 2.5# 2 x 12 reps (VC for correct heel to toe sequencing)  10 MWT after training= 18.68 and 18.88 sec with cane with patient focusing more on proper heel to toe gait sequencing.      PATIENT EDUCATION: Education details: Pt educated throughout session about proper posture and technique with exercises. Improved exercise technique, movement at target joints, use of target muscles after min to mod verbal, visual, tactile cues.  Person educated: Patient Education method: Explanation, Demonstration, and Tactile cues Education comprehension: verbalized understanding, returned demonstration, verbal cues required, and tactile cues required   HOME EXERCISE PROGRAM: Continue as previously given  Added df/pf  exercise to HEP -11/02/21   PT Short Term Goals -      PT SHORT TERM GOAL #1   Title Patient will be independent in home exercise program to improve strength/mobility for better functional independence with  ADLs.    Baseline 5/4: HEP given 6/20: does HEP 2x a week 7/31: 3-4x a week   Time 4   Period Weeks    Status Achieved    Target Date 01/05/22             PT Long Term Goals       PT LONG TERM GOAL #1   Title Patient will increase FOTO score to equal to or greater than 63%    to demonstrate statistically significant improvement in mobility and quality of life.    Baseline 5/4: 48%, 6/20: 60% , 7/31: 60%;9/15: 52%; 03/12/22: 52%   Time 12   Period Weeks    Status Partially Met    Target Date 04/20/2022     PT LONG TERM GOAL #2   Title Patient will increase 10 meter walk test to >1.62m/s as to improve gait speed for better community ambulation and to reduce fall risk.    Baseline 5/4: 0.54 m/s with RW, 6/20: 0.63 m/s 7/31:  0.57 ; with SPC; 9/15: 0.42 m/s with SPC, *without AFO (waiting for new one); 03/12/22: 0.56 m/s with new AFO and SPC.   Time 12   Period Weeks    Status Partially Met    Target Date 04/20/2022     PT LONG TERM GOAL #3   Title Patient will demonstrate an improved Berg Balance Score of >45/56 as to demonstrate improved balance with ADLs such as sitting/standing and transfer balance and reduced fall risk.    Baseline 5/4: 32/56, 6/20: 45/56 , 7/31: 49/56; 9/15: 53/56   Time 8   Period Weeks    Status Achieved;   Target Date 02/05/22     PT LONG TERM GOAL #4   Title Patient will increase BLE gross strength to 4+/5 as to improve functional strength for independent gait, increased standing tolerance and increased ADL ability.    Baseline 5/4: see note, 6/20:R knee 4/5 , 7/31: not formally assessed; 9/15: BLE strength is grossly 4/5; 03/12/22: deferred to primary PT due to intra- rater reliability    Time 12   Period Weeks    Status Partially Met    Target Date 04/20/2022   PT LONG TERM GOAL #5  Title  Patient will increase ABC scale score >80% to demonstrate better functional mobility and better confidence with ADLs.   Baseline 9/15: NT. 03/12/22: 80.6%   Time 12  Period Weeks   Status ACHIEVED  Target Date 04/20/2022              Plan     Clinical Impression Statement Focus of treatment session continued on power production per plan from last visit. Patient was introduced to standing cable resistance. She required VC and visual demonstration for correct and safe technique. She responded well- stating fatigue but able to follow all cues and complete reps without report of pain or any LOB. No significant difference with gait speed after heel to toe gait sequencing activity however much improved mechanics with improved heel strike after training without sacrificing speed. Patient will continue PT POC to address remaining deficits to reduce risk of falls.      Personal Factors and Comorbidities Comorbidity 3+;Fitness;Past/Current Experience;Time since onset of injury/illness/exacerbation;Transportation    Comorbidities hepatitis B, arthritis, diverticulitis, GERD, hyperlipidemia, hypertension    Examination-Activity Limitations Bed Mobility;Caring for [employer];Locomotion Level;Lift;Squat;Stairs;Stand;Toileting;Transfers    Examination-Participation Restrictions Cleaning;Driving;Meal Prep;Laundry;Shop;Volunteer;Yard Work    Conservation officer, historic buildings Evolving/Moderate complexity    Rehab Potential Fair    PT Frequency 1x / week    PT Duration 12 weeks    PT Treatment/Interventions ADLs/Self Care Home Management;Canalith Repostioning;Cryotherapy;Electrical Stimulation;Iontophoresis 4mg /ml Dexamethasone;Moist Heat;Traction;Ultrasound;Functional mobility training;Stair training;Gait training;DME Instruction;Therapeutic activities;Therapeutic exercise;Balance training;Neuromuscular re-education;Manual techniques;Orthotic Fit/Training;Patient/family education;Passive range of motion;Dry needling;Taping;Splinting;Energy conservation;Visual/perceptual remediation/compensation;Vestibular    PT Next Visit Plan progress  stabilization and strength    PT Home Exercise Plan see above    Consulted and Agree with Plan of Care Patient             Louis Meckel, PT Physical Therapist- [facility]  12:54 PM, 03/21/22

## 2022-03-22 ENCOUNTER — Ambulatory Visit: Payer: Medicare Other

## 2022-03-26 ENCOUNTER — Ambulatory Visit: Payer: Medicare Other | Admitting: Physical Therapy

## 2022-03-27 ENCOUNTER — Ambulatory Visit: Payer: Medicare Other

## 2022-03-28 ENCOUNTER — Ambulatory Visit: Payer: Medicare Other | Admitting: Physical Therapy

## 2022-03-29 ENCOUNTER — Ambulatory Visit: Payer: Medicare Other

## 2022-04-02 ENCOUNTER — Ambulatory Visit: Payer: Medicare Other | Admitting: Physical Therapy

## 2022-04-02 NOTE — Therapy (Signed)
OUTPATIENT PHYSICAL THERAPY TREATMENT NOTE   Patient Name: Terri Werner MRN: [id number] DOB:February 16, 1951, 71 y.o., female Today's Date: 03/21/2022  PCP: Terri Mail DO  REFERRING PROVIDER: Erick Colace Werner    PT End of Session - 03/21/22 1009     Visit Number 41    Number of Visits 42    Date for PT Re-Evaluation 04/20/22    Authorization Type BCBS Medicare    Authorization Time Period 01/26/22-04/20/22    Progress Note Due on Visit 50    PT Start Time 1006    PT Stop Time 1055    PT Time Calculation (min) 49 min    Equipment Utilized During Treatment Gait belt    Activity Tolerance Patient tolerated treatment well    Behavior During Therapy WFL for tasks assessed/performed                  Past Medical History:  Diagnosis Date   Arthritis    pt reports in back & knees   Colon polyps 2010   At [facility]   Diverticulitis 2016   Genital warts    GERD (gastroesophageal reflux disease)    Heart murmur    Hepatitis B    History of blood transfusion    during each major surgery per pt   History of chicken pox    History of hiatal hernia    History of torn meniscus of right knee    HTN (hypertension)    Hx of migraine headaches    Hypertension    Hypopotassemia    Pre-diabetes    Stroke (HCC) 05/27/2021   Past Surgical History:  Procedure Laterality Date   ABDOMINAL HYSTERECTOMY  2006   APPENDECTOMY  1980   CESAREAN SECTION  1981   COLONOSCOPY WITH PROPOFOL N/A 11/01/2014   Procedure: COLONOSCOPY WITH PROPOFOL;  Surgeon: Scot Jun, Werner;  Location: [facility] ENDOSCOPY;  Service: Endoscopy;  Laterality: N/A;   COLONOSCOPY WITH PROPOFOL N/A 01/05/2020   Procedure: COLONOSCOPY WITH PROPOFOL;  Surgeon: Regis Bill, Werner;  Location: [facility] ENDOSCOPY;  Service: Endoscopy;  Laterality: N/A;   CYSTOSCOPY WITH BIOPSY N/A 02/19/2022   Procedure: CYSTOSCOPY WITH BLADDER BIOPSY;  Surgeon: Vanna Scotland, Werner;  Location: [facility] ORS;  Service: Urology;   Laterality: N/A;   ECTOPIC PREGNANCY SURGERY  1980   HERNIA REPAIR  2006   Umbilical   KNEE ARTHROSCOPY Left 2006   torn meniscus   TONSILLECTOMY  1971   TOTAL ABDOMINAL HYSTERECTOMY W/ BILATERAL SALPINGOOPHORECTOMY  2006   VESICOVAGINAL FISTULA CLOSURE W/ TAH     Patient Active Problem List   Diagnosis Date Noted   Hx of spontaneous intraparenchymal intracranial hemorrhage 07/12/2021   Intraparenchymal hemorrhage of brain (HCC) 05/31/2021   ICH (intracerebral hemorrhage) (HCC) 05/27/2021   Morbid obesity (HCC) 09/24/2018   Unspecified inflammatory spondylopathy, lumbar region (HCC) 03/05/2018   Lumbar spondylolysis 08/20/2017   Lumbar facet arthropathy 08/20/2017   Lumbar degenerative disc disease 08/20/2017   Chronic constipation 07/30/2017   Primary osteoarthritis involving multiple joints 04/17/2017   Postmenopausal 10/12/2016   Prediabetes 10/12/2016   Lumbago 08/04/2015   Diverticulitis of colon 07/05/2014   Carotid artery disorder (HCC) 04/27/2014   Hyperlipidemia 10/08/2013   Hypertension, benign 11/19/2009    REFERRING DIAG: R spastic hemiplegia   THERAPY DIAG:  Muscle weakness (generalized)  Unsteadiness on feet  Other lack of coordination  Difficulty in walking, not elsewhere classified  Abnormality of gait and mobility  Rationale for Evaluation and Treatment  Rehabilitation  PERTINENT HISTORY: Terri Werner is a 71 y.o. right-handed female with history of hepatitis B, arthritis, diverticulitis, GERD, hyperlipidemia, hypertension. Patient is recently retired. She has a daughter and niece in the area. Presented 05/27/2021 with sudden onset of right leg weakness and mild headache. Cranial CT scan showed acute parenchymal hemorrhage in the parasagittal left frontal parietal lobes with mild edema. No substantial mass effect. CT angiogram head and neck no abnormal vascularity in the region of hemorrhage no hemodynamically significant stenosis. MRI  follow-up showed no mass effect seen underlying the left posterior frontal hematoma. Patient admitted to inpatient rehab on 05/31/21 and discharged on 06/27/21. Patient used home health at home that stopped two weeks ago. Patient has R foot drop. Has a wheelchair she does household chores in, a RW, a quad cane and R AFO  PRECAUTIONS: fall, has latex allergy must use latex-free bands  SUBJECTIVE:***  PAIN:  Are you having pain? None  at rest.     TODAY'S TREATMENT: There.ex:  Sit to stand with arms crossed x 10 reps (no significant difficulty- able to rise on 1st attempt each rep)    Gait speed with cane=21.03 sec  and 17.88 sec Seated LAQ with cable system at 2.5# B LE 10-12 reps; 7.5# B LE x 10 reps (patient reported as challenging) Standing Hip ext with cable system at 2.5 # BLE 12 reps x 2 sets- VC for correct technique and standing posture.  Standing Heel to toe gait sequence using cable 2.5# 2 x 12 reps (VC for correct heel to toe sequencing)  10 MWT after training= 18.68 and 18.88 sec with cane with patient focusing more on proper heel to toe gait sequencing.      PATIENT EDUCATION: Education details: Pt educated throughout session about proper posture and technique with exercises. Improved exercise technique, movement at target joints, use of target muscles after min to mod verbal, visual, tactile cues.  Person educated: Patient Education method: Explanation, Demonstration, and Tactile cues Education comprehension: verbalized understanding, returned demonstration, verbal cues required, and tactile cues required   HOME EXERCISE PROGRAM: Continue as previously given  Added df/pf  exercise to HEP -11/02/21   PT Short Term Goals -      PT SHORT TERM GOAL #1   Title Patient will be independent in home exercise program to improve strength/mobility for better functional independence with ADLs.    Baseline 5/4: HEP given 6/20: does HEP 2x a week 7/31: 3-4x a week   Time 4    Period Weeks    Status Achieved    Target Date 01/05/22             PT Long Term Goals       PT LONG TERM GOAL #1   Title Patient will increase FOTO score to equal to or greater than 63%    to demonstrate statistically significant improvement in mobility and quality of life.    Baseline 5/4: 48%, 6/20: 60% , 7/31: 60%;9/15: 52%; 03/12/22: 52%   Time 12   Period Weeks    Status Partially Met    Target Date 04/20/2022     PT LONG TERM GOAL #2   Title Patient will increase 10 meter walk test to >1.48m/s as to improve gait speed for better community ambulation and to reduce fall risk.    Baseline 5/4: 0.54 m/s with RW, 6/20: 0.63 m/s 7/31: 0.57 ; with SPC; 9/15: 0.42 m/s with SPC, *without AFO (waiting for new one); 03/12/22:  0.56 m/s with new AFO and SPC.   Time 12   Period Weeks    Status Partially Met    Target Date 04/20/2022     PT LONG TERM GOAL #3   Title Patient will demonstrate an improved Berg Balance Score of >45/56 as to demonstrate improved balance with ADLs such as sitting/standing and transfer balance and reduced fall risk.    Baseline 5/4: 32/56, 6/20: 45/56 , 7/31: 49/56; 9/15: 53/56   Time 8   Period Weeks    Status Achieved;   Target Date 02/05/22     PT LONG TERM GOAL #4   Title Patient will increase BLE gross strength to 4+/5 as to improve functional strength for independent gait, increased standing tolerance and increased ADL ability.    Baseline 5/4: see note, 6/20:R knee 4/5 , 7/31: not formally assessed; 9/15: BLE strength is grossly 4/5; 03/12/22: deferred to primary PT due to intra- rater reliability    Time 12   Period Weeks    Status Partially Met    Target Date 04/20/2022   PT LONG TERM GOAL #5  Title  Patient will increase ABC scale score >80% to demonstrate better functional mobility and better confidence with ADLs.   Baseline 9/15: NT. 03/12/22: 80.6%  Time 12  Period Weeks   Status ACHIEVED  Target Date 04/20/2022              Plan      Clinical Impression Statement *** Patient will continue PT POC to address remaining deficits to reduce risk of falls.      Personal Factors and Comorbidities Comorbidity 3+;Fitness;Past/Current Experience;Time since onset of injury/illness/exacerbation;Transportation    Comorbidities hepatitis B, arthritis, diverticulitis, GERD, hyperlipidemia, hypertension    Examination-Activity Limitations Bed Mobility;Caring for [employer];Locomotion Level;Lift;Squat;Stairs;Stand;Toileting;Transfers    Examination-Participation Restrictions Cleaning;Driving;Meal Prep;Laundry;Shop;Volunteer;Yard Work    Conservation officer, historic buildings Evolving/Moderate complexity    Rehab Potential Fair    PT Frequency 1x / week    PT Duration 12 weeks    PT Treatment/Interventions ADLs/Self Care Home Management;Canalith Repostioning;Cryotherapy;Electrical Stimulation;Iontophoresis 4mg /ml Dexamethasone;Moist Heat;Traction;Ultrasound;Functional mobility training;Stair training;Gait training;DME Instruction;Therapeutic activities;Therapeutic exercise;Balance training;Neuromuscular re-education;Manual techniques;Orthotic Fit/Training;Patient/family education;Passive range of motion;Dry needling;Taping;Splinting;Energy conservation;Visual/perceptual remediation/compensation;Vestibular    PT Next Visit Plan progress stabilization and strength    PT Home Exercise Plan see above    Consulted and Agree with Plan of Care Patient             Precious Bard PT' Physical Therapist- [place]  [facility]  12:54 PM, 03/21/22

## 2022-04-03 ENCOUNTER — Ambulatory Visit: Payer: Medicare Other

## 2022-04-03 DIAGNOSIS — M6281 Muscle weakness (generalized): Secondary | ICD-10-CM

## 2022-04-03 DIAGNOSIS — R2681 Unsteadiness on feet: Secondary | ICD-10-CM

## 2022-04-03 DIAGNOSIS — R278 Other lack of coordination: Secondary | ICD-10-CM

## 2022-04-03 DIAGNOSIS — R262 Difficulty in walking, not elsewhere classified: Secondary | ICD-10-CM | POA: Diagnosis not present

## 2022-04-03 DIAGNOSIS — R269 Unspecified abnormalities of gait and mobility: Secondary | ICD-10-CM | POA: Diagnosis not present

## 2022-04-04 ENCOUNTER — Ambulatory Visit: Payer: Medicare Other | Admitting: Physical Therapy

## 2022-04-09 NOTE — Therapy (Signed)
OUTPATIENT PHYSICAL THERAPY TREATMENT NOTE/DISCHARGE   Patient Name: Terri Werner MRN: [id number] DOB:1950-10-07, 71 y.o., female Today's Date: 04/10/2022  PCP: Margarita Mail DO  REFERRING PROVIDER: Erick Colace MD    PT End of Session - 04/10/22 0801     Visit Number 43    Number of Visits 44    Date for PT Re-Evaluation 04/20/22    Authorization Type BCBS Medicare    Authorization Time Period 01/26/22-04/20/22    Progress Note Due on Visit 50    PT Start Time 0801    PT Stop Time 0844    PT Time Calculation (min) 43 min    Equipment Utilized During Treatment Gait belt    Activity Tolerance Patient tolerated treatment well    Behavior During Therapy WFL for tasks assessed/performed                   Past Medical History:  Diagnosis Date   Arthritis    pt reports in back & knees   Colon polyps 2010   At [facility]   Diverticulitis 2016   Genital warts    GERD (gastroesophageal reflux disease)    Heart murmur    Hepatitis B    History of blood transfusion    during each major surgery per pt   History of chicken pox    History of hiatal hernia    History of torn meniscus of right knee    HTN (hypertension)    Hx of migraine headaches    Hypertension    Hypopotassemia    Pre-diabetes    Stroke (HCC) 05/27/2021   Past Surgical History:  Procedure Laterality Date   ABDOMINAL HYSTERECTOMY  2006   APPENDECTOMY  1980   CESAREAN SECTION  1981   COLONOSCOPY WITH PROPOFOL N/A 11/01/2014   Procedure: COLONOSCOPY WITH PROPOFOL;  Surgeon: Scot Jun, MD;  Location: [facility] ENDOSCOPY;  Service: Endoscopy;  Laterality: N/A;   COLONOSCOPY WITH PROPOFOL N/A 01/05/2020   Procedure: COLONOSCOPY WITH PROPOFOL;  Surgeon: Regis Bill, MD;  Location: [facility] ENDOSCOPY;  Service: Endoscopy;  Laterality: N/A;   CYSTOSCOPY WITH BIOPSY N/A 02/19/2022   Procedure: CYSTOSCOPY WITH BLADDER BIOPSY;  Surgeon: Vanna Scotland, MD;  Location: [facility] ORS;   Service: Urology;  Laterality: N/A;   ECTOPIC PREGNANCY SURGERY  1980   HERNIA REPAIR  2006   Umbilical   KNEE ARTHROSCOPY Left 2006   torn meniscus   TONSILLECTOMY  1971   TOTAL ABDOMINAL HYSTERECTOMY W/ BILATERAL SALPINGOOPHORECTOMY  2006   VESICOVAGINAL FISTULA CLOSURE W/ TAH     Patient Active Problem List   Diagnosis Date Noted   Hx of spontaneous intraparenchymal intracranial hemorrhage 07/12/2021   Intraparenchymal hemorrhage of brain (HCC) 05/31/2021   ICH (intracerebral hemorrhage) (HCC) 05/27/2021   Morbid obesity (HCC) 09/24/2018   Unspecified inflammatory spondylopathy, lumbar region (HCC) 03/05/2018   Lumbar spondylolysis 08/20/2017   Lumbar facet arthropathy 08/20/2017   Lumbar degenerative disc disease 08/20/2017   Chronic constipation 07/30/2017   Primary osteoarthritis involving multiple joints 04/17/2017   Postmenopausal 10/12/2016   Prediabetes 10/12/2016   Lumbago 08/04/2015   Diverticulitis of colon 07/05/2014   Carotid artery disorder (HCC) 04/27/2014   Hyperlipidemia 10/08/2013   Hypertension, benign 11/19/2009    REFERRING DIAG: R spastic hemiplegia   THERAPY DIAG:  Muscle weakness (generalized)  Unsteadiness on feet  Difficulty in walking, not elsewhere classified  Rationale for Evaluation and Treatment Rehabilitation  PERTINENT HISTORY: Terri Werner is a 71  y.o. right-handed female with history of hepatitis B, arthritis, diverticulitis, GERD, hyperlipidemia, hypertension. Patient is recently retired. She has a daughter and niece in the area. Presented 05/27/2021 with sudden onset of right leg weakness and mild headache. Cranial CT scan showed acute parenchymal hemorrhage in the parasagittal left frontal parietal lobes with mild edema. No substantial mass effect. CT angiogram head and neck no abnormal vascularity in the region of hemorrhage no hemodynamically significant stenosis. MRI follow-up showed no mass effect seen underlying the  left posterior frontal hematoma. Patient admitted to inpatient rehab on 05/31/21 and discharged on 06/27/21. Patient used home health at home that stopped two weeks ago. Patient has R foot drop. Has a wheelchair she does household chores in, a RW, a quad cane and R AFO  PRECAUTIONS: fall, has latex allergy must use latex-free bands  SUBJECTIVE:Patient is ready for today to be her last PT session as she is having difficulty with rides  PAIN:  Are you having pain? None  at rest.     TODAY'S TREATMENT:  Goals:see below for details  Review new HEP: patient demonstrating understanding and progressions of difficulty     PATIENT EDUCATION: Education details: Pt educated throughout session about proper posture and technique with exercises. Improved exercise technique, movement at target joints, use of target muscles after min to mod verbal, visual, tactile cues.  Person educated: Patient Education method: Explanation, Demonstration, and Tactile cues Education comprehension: verbalized understanding, returned demonstration, verbal cues required, and tactile cues required   HOME EXERCISE PROGRAM: Continue as previously given  Added df/pf  exercise to HEP -11/02/21  Access Code: [email or web address] URL: [email or web address] Date: 04/10/2022 Prepared by: Precious Bard  Exercises - Supine Bridge  - 1 x daily - 7 x weekly - 2 sets - 10 reps - 5 hold - Supine Active Straight Leg Raise  - 1 x daily - 7 x weekly - 2 sets - 10 reps - 5 hold - Supine Lower Trunk Rotation  - 1 x daily - 7 x weekly - 2 sets - 10 reps - 5 hold - Supine Posterior Pelvic Tilt  - 1 x daily - 7 x weekly - 2 sets - 10 reps - 5 hold - Seated Hamstring Stretch  - 1 x daily - 7 x weekly - 2 sets - 10 reps - 5 hold - Leg Extension  - 1 x daily - 7 x weekly - 2 sets - 10 reps - 5 hold - Seated March  - 1 x daily - 7 x weekly - 2 sets - 10 reps - 5 hold - Seated Hip Abduction  - 1 x daily - 7 x weekly - 2 sets - 10 reps  - 5 hold - Seated Hip Adduction Isometrics with Ball  - 1 x daily - 7 x weekly - 2 sets - 10 reps - 5 hold - Standing March with Counter Support  - 1 x daily - 7 x weekly - 2 sets - 10 reps - 5 hold - Standing Hip Abduction with Counter Support  - 1 x daily - 7 x weekly - 2 sets - 10 reps - 5 hold - Standing Hip Extension with Counter Support  - 1 x daily - 7 x weekly - 2 sets - 10 reps - 5 hold - Mini Squat with Counter Support  - 1 x daily - 7 x weekly - 2 sets - 10 reps - 5 hold - Heel Raises with Counter Support  - 1 x daily -  7 x weekly - 2 sets - 10 reps - 5 hold - Standing Tandem Balance with Counter Support  - 1 x daily - 7 x weekly - 2 sets - 2 reps - 30 hold - Standing Single Leg Stance with Unilateral Counter Support  - 1 x daily - 7 x weekly - 2 sets - 1 reps - 30 hold   PT Short Term Goals -      PT SHORT TERM GOAL #1   Title Patient will be independent in home exercise program to improve strength/mobility for better functional independence with ADLs.    Baseline 5/4: HEP given 6/20: does HEP 2x a week 7/31: 3-4x a week   Time 4   Period Weeks    Status Achieved    Target Date 01/05/22             PT Long Term Goals       PT LONG TERM GOAL #1   Title Patient will increase FOTO score to equal to or greater than 63%    to demonstrate statistically significant improvement in mobility and quality of life.    Baseline 5/4: 48%, 6/20: 60% , 7/31: 60%;9/15: 52%; 03/12/22: 52% 11/28: 60%    Time 12   Period Weeks    Status Partially Met    Target Date 04/20/2022     PT LONG TERM GOAL #2   Title Patient will increase 10 meter walk test to >1.7m/s as to improve gait speed for better community ambulation and to reduce fall risk.    Baseline 5/4: 0.54 m/s with RW, 6/20: 0.63 m/s 7/31: 0.57 ; with SPC; 9/15: 0.42 m/s with SPC, *without AFO (waiting for new one); 03/12/22: 0.56 m/s with new AFO and SPC. 0.71 m/s with SPC    Time 12   Period Weeks    Status Partially Met     Target Date 04/20/2022     PT LONG TERM GOAL #3   Title Patient will demonstrate an improved Berg Balance Score of >45/56 as to demonstrate improved balance with ADLs such as sitting/standing and transfer balance and reduced fall risk.    Baseline 5/4: 32/56, 6/20: 45/56 , 7/31: 49/56; 9/15: 53/56   Time 8   Period Weeks    Status Achieved;   Target Date 02/05/22     PT LONG TERM GOAL #4   Title Patient will increase BLE gross strength to 4+/5 as to improve functional strength for independent gait, increased standing tolerance and increased ADL ability.    Baseline 5/4: see note, 6/20:R knee 4/5 , 7/31: not formally assessed; 9/15: BLE strength is grossly 4/5; 03/12/22: deferred to primary PT due to intra- rater reliability    Time 12   Period Weeks    Status Partially Met    Target Date 04/20/2022   PT LONG TERM GOAL #5  Title  Patient will increase ABC scale score >80% to demonstrate better functional mobility and better confidence with ADLs.   Baseline 9/15: NT. 03/12/22: 80.6%  Time 12  Period Weeks   Status ACHIEVED  Target Date 04/20/2022              Plan     Clinical Impression Statement Patient is aware today is last PT session. Education and performance on complete HEP progression for back, balance, and strength completed during session with patient demonstrating understanding. We will be happy to see this patient again in the future if needed.     Personal Factors  and Comorbidities Comorbidity 3+;Fitness;Past/Current Experience;Time since onset of injury/illness/exacerbation;Transportation    Comorbidities hepatitis B, arthritis, diverticulitis, GERD, hyperlipidemia, hypertension    Examination-Activity Limitations Bed Mobility;Caring for [employer];Locomotion Level;Lift;Squat;Stairs;Stand;Toileting;Transfers    Examination-Participation Restrictions Cleaning;Driving;Meal Prep;Laundry;Shop;Volunteer;Yard Work    Health visitor Evolving/Moderate complexity    Rehab Potential Fair    PT Frequency 1x / week    PT Duration 12 weeks    PT Treatment/Interventions ADLs/Self Care Home Management;Canalith Repostioning;Cryotherapy;Electrical Stimulation;Iontophoresis 4mg /ml Dexamethasone;Moist Heat;Traction;Ultrasound;Functional mobility training;Stair training;Gait training;DME Instruction;Therapeutic activities;Therapeutic exercise;Balance training;Neuromuscular re-education;Manual techniques;Orthotic Fit/Training;Patient/family education;Passive range of motion;Dry needling;Taping;Splinting;Energy conservation;Visual/perceptual remediation/compensation;Vestibular    PT Next Visit Plan progress stabilization and strength    PT Home Exercise Plan see above    Consulted and Agree with Plan of Care Patient             Precious Bard PT' Physical Therapist- [place]  [facility]  8:54 AM, 04/10/22

## 2022-04-10 ENCOUNTER — Ambulatory Visit: Payer: Medicare Other

## 2022-04-10 DIAGNOSIS — M6281 Muscle weakness (generalized): Secondary | ICD-10-CM | POA: Diagnosis not present

## 2022-04-10 DIAGNOSIS — R2681 Unsteadiness on feet: Secondary | ICD-10-CM

## 2022-04-10 DIAGNOSIS — R269 Unspecified abnormalities of gait and mobility: Secondary | ICD-10-CM | POA: Diagnosis not present

## 2022-04-10 DIAGNOSIS — R262 Difficulty in walking, not elsewhere classified: Secondary | ICD-10-CM | POA: Diagnosis not present

## 2022-04-10 DIAGNOSIS — R278 Other lack of coordination: Secondary | ICD-10-CM | POA: Diagnosis not present

## 2022-04-12 ENCOUNTER — Ambulatory Visit: Payer: Medicare Other

## 2022-04-12 ENCOUNTER — Ambulatory Visit (INDEPENDENT_AMBULATORY_CARE_PROVIDER_SITE_OTHER): Payer: Medicare Other | Admitting: Internal Medicine

## 2022-04-12 ENCOUNTER — Encounter: Payer: Self-pay | Admitting: Internal Medicine

## 2022-04-12 VITALS — BP 126/72 | HR 82 | Temp 97.7°F | Resp 16 | Ht 62.0 in | Wt 223.5 lb

## 2022-04-12 DIAGNOSIS — Z8679 Personal history of other diseases of the circulatory system: Secondary | ICD-10-CM | POA: Diagnosis not present

## 2022-04-12 DIAGNOSIS — I10 Essential (primary) hypertension: Secondary | ICD-10-CM | POA: Diagnosis not present

## 2022-04-12 DIAGNOSIS — E7800 Pure hypercholesterolemia, unspecified: Secondary | ICD-10-CM

## 2022-04-12 MED ORDER — VALSARTAN-HYDROCHLOROTHIAZIDE 80-12.5 MG PO TABS: 1.0000 | ORAL_TABLET | Freq: Every day | ORAL | 0 refills | Status: DC

## 2022-04-12 MED ORDER — AMLODIPINE BESYLATE 5 MG PO TABS: 5.0000 mg | ORAL_TABLET | Freq: Every day | ORAL | 0 refills | Status: DC

## 2022-04-12 NOTE — Patient Instructions (Signed)
It was great seeing you today!  Plan discussed at today's visit: -Medications refilled today -Please be fasting for next time at least 8-12 hours   Follow up in: 3 months   Take care and let us know if you have any questions or concerns prior to your next visit.  Dr. Rosana Berger

## 2022-04-12 NOTE — Progress Notes (Signed)
Established Patient Office Visit  Subjective:  Patient ID: Terri Werner, female    DOB: 08-11-1950  Age: 71 y.o. MRN: [id number]  CC:  Chief Complaint  Patient presents with   Follow-up    HPI Terri Werner presents for follow up on follow up.   Hypertension: -Medications: Amlodipine 5 mg, Valsartan-HCTZ 80-12.5 mg daily  -Patient is compliant with above medications and reports no side effects. -Checking BP at home (average): 120-130/70-80 -Denies any SOB, CP, vision changes, LE edema or symptoms of hypotension.   HLD/HX of CVA: -Medications: Crestor discontinued after SATURN trial (no statins until 06/2013) but on Flexeril 5 mg PRN -Patient just finished with PT and OT outpatient. Ambulating well with cane. Doing home exercises. Receiving botox injections in her right lower extremity which have been helping somewhat.  -Last lipid panel: Lipid Panel     Component Value Date/Time   CHOL 158 06/01/2021 0510   TRIG 60 06/01/2021 0510   HDL 50 06/01/2021 0510   CHOLHDL 3.2 06/01/2021 0510   VLDL 12 06/01/2021 0510   LDLCALC 96 06/01/2021 0510   LDLCALC 101 (H) 02/27/2021 1040   Chronic Constipation: -Currently taking Senna PRN -Having one BM about every 3 days, doing well no changes.  Abnormal Urinary Flow: -Following with Urology, note from 11/01/21 reviewed. Cystoscopy, cystogram and CT A/P negative for signs of fistula. Patient states she only had the gas in the urine symptoms once or twice since testing.   Health Maintenance: -Blood work up to date -Mammogram 7/23 Birads-1  -Colon cancer screening: Colonoscopy 1/23  Past Medical History:  Diagnosis Date   Arthritis    pt reports in back & knees   Colon polyps 2010   At [facility]   Diverticulitis 2016   Genital warts    GERD (gastroesophageal reflux disease)    Heart murmur    Hepatitis B    History of blood transfusion    during each major surgery per pt   History of chicken pox     History of hiatal hernia    History of torn meniscus of right knee    HTN (hypertension)    Hx of migraine headaches    Hypertension    Hypopotassemia    Pre-diabetes    Stroke (HCC) 05/27/2021    Past Surgical History:  Procedure Laterality Date   ABDOMINAL HYSTERECTOMY  2006   APPENDECTOMY  1980   CESAREAN SECTION  1981   COLONOSCOPY WITH PROPOFOL N/A 11/01/2014   Procedure: COLONOSCOPY WITH PROPOFOL;  Surgeon: Scot Jun, MD;  Location: [facility] ENDOSCOPY;  Service: Endoscopy;  Laterality: N/A;   COLONOSCOPY WITH PROPOFOL N/A 01/05/2020   Procedure: COLONOSCOPY WITH PROPOFOL;  Surgeon: Regis Bill, MD;  Location: [facility] ENDOSCOPY;  Service: Endoscopy;  Laterality: N/A;   CYSTOSCOPY WITH BIOPSY N/A 02/19/2022   Procedure: CYSTOSCOPY WITH BLADDER BIOPSY;  Surgeon: Vanna Scotland, MD;  Location: [facility] ORS;  Service: Urology;  Laterality: N/A;   ECTOPIC PREGNANCY SURGERY  1980   HERNIA REPAIR  2006   Umbilical   KNEE ARTHROSCOPY Left 2006   torn meniscus   TONSILLECTOMY  1971   TOTAL ABDOMINAL HYSTERECTOMY W/ BILATERAL SALPINGOOPHORECTOMY  2006   VESICOVAGINAL FISTULA CLOSURE W/ TAH      Family History  Problem Relation Age of Onset   Breast cancer Sister 60   Hypertension Mother    Alzheimer's disease Mother    Dementia Mother    Hypertension Maternal Grandfather    Hyperlipidemia  Maternal Grandfather    Birth defects Maternal Grandmother        Bladder   Colon cancer Maternal Grandmother    Congestive Heart Failure Father    Hyperlipidemia Father    Dementia Paternal Grandmother    Dementia Paternal Grandfather    Thyroid disease Sister     Social History   Socioeconomic History   Marital status: Widowed    Spouse name: Not on file   Number of children: 2   Years of education: Not on file   Highest education level: Master's degree (e.g., MA, MS, MEng, MEd, MSW, MBA)  Occupational History   Occupation: [employer]    Comment: Full  Time    Employer: [employer]  Tobacco Use   Smoking status: Never   Smokeless tobacco: Never  Vaping Use   Vaping Use: Never used  Substance and Sexual Activity   Alcohol use: Not Currently    Comment: rare   Drug use: No   Sexual activity: Not Currently    Partners: Male  Other Topics Concern   Not on file  Social History Narrative   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      Regular Exercise -  NO   Daily Caffeine Use:  1 soda/tea          Social Determinants of Health   Financial Resource Strain: High Risk (03/05/2018)   Overall Financial Resource Strain (CARDIA)    Difficulty of Paying Living Expenses: Hard  Food Insecurity: No Food Insecurity (03/05/2018)   Hunger Vital Sign    Worried About Running Out of Food in the Last Year: Never true    Ran Out of Food in the Last Year: Never true  Transportation Needs: No Transportation Needs (03/05/2018)   PRAPARE - Administrator, Civil Service (Medical): No    Lack of Transportation (Non-Medical): No  Physical Activity: Insufficiently Active (03/05/2018)   Exercise Vital Sign    Days of Exercise per Week: 3 days    Minutes of Exercise per Session: 20 min  Stress: No Stress Concern Present (03/05/2018)   [employer] of Occupational Health - Occupational Stress Questionnaire    Feeling of Stress : Not at all  Social Connections: Moderately Integrated (03/05/2018)   Social Connection and Isolation Panel [NHANES]    Frequency of Communication with Friends and Family: More than three times a week    Frequency of Social Gatherings with Friends and Family: Twice a week    Attends Religious Services: More than 4 times per year    Active Member of [employer] or Organizations: Yes    Attends Banker Meetings: More than 4 times per year    Marital Status: Widowed  Intimate Partner Violence:  Not At Risk (03/05/2018)   Humiliation, Afraid, Rape, and Kick questionnaire    Fear of Current or Ex-Partner: No    Emotionally Abused: No    Physically Abused: No    Sexually Abused: No    Outpatient Medications Prior to Visit  Medication Sig Dispense Refill   acetaminophen (TYLENOL) 325 MG tablet Take 2 tablets (650 mg total) by mouth every 4 (four) hours as needed for mild pain (or temp > 37.5 C (99.5 F)).     acidophilus (RISAQUAD) CAPS capsule Take 1 capsule by mouth daily. 30 capsule 0   amLODipine (NORVASC) 5  MG tablet Take 1 tablet (5 mg total) by mouth daily. 90 tablet 1   Cholecalciferol (VITAMIN D) 50 MCG (2000 UT) tablet Take 1 tablet (2,000 Units total) by mouth daily. 90 tablet 3   cyclobenzaprine (FLEXERIL) 5 MG tablet TAKE 1 TABLET BY MOUTH DAILY AS NEEDED FOR MUSCLE SPASMS. 30 tablet 1   diclofenac Sodium (VOLTAREN) 1 % GEL Apply 4 g topically 4 (four) times daily. 400 g 0   methocarbamol (ROBAXIN) 500 MG tablet Take 500 mg by mouth at bedtime as needed.     pantoprazole (PROTONIX) 40 MG tablet      polyethylene glycol powder (GLYCOLAX/MIRALAX) 17 GM/SCOOP powder Take 17 g by mouth daily as needed for moderate constipation. 3350 g 1   senna-docusate (SENOKOT-S) 8.6-50 MG tablet Take 1 tablet by mouth 2 (two) times daily. 90 tablet 3   sulfamethoxazole-trimethoprim (BACTRIM DS) 800-160 MG tablet Take 1 tablet by mouth every 12 (twelve) hours. Start 3 days prior to procedure and then continue course 14 tablet 0   No facility-administered medications prior to visit.    Allergies  Allergen Reactions   Shrimp [Shellfish Allergy] Swelling    Lips will burn    Latex Itching    Burning    ROS Review of Systems  Constitutional:  Negative for chills and fever.  Eyes:  Negative for visual disturbance.  Respiratory:  Negative for shortness of breath.   Cardiovascular:  Negative for chest pain and palpitations.  Genitourinary:  Negative for dysuria.  Neurological:   Negative for dizziness and headaches.      Objective:    Physical Exam Constitutional:      Appearance: Normal appearance.     Comments: Walking with cane  HENT:     Head: Normocephalic and atraumatic.  Eyes:     Conjunctiva/sclera: Conjunctivae normal.  Cardiovascular:     Rate and Rhythm: Normal rate and regular rhythm.  Pulmonary:     Effort: Pulmonary effort is normal.     Breath sounds: Normal breath sounds.  Musculoskeletal:     Right lower leg: No edema.     Left lower leg: No edema.  Skin:    General: Skin is warm and dry.  Neurological:     Mental Status: She is alert. Mental status is at baseline.  Psychiatric:        Mood and Affect: Mood normal.        Behavior: Behavior normal.    BP 126/72   Pulse 82   Temp 97.7 F (36.5 C)   Resp 16   Ht 5\' 2"  (1.575 m)   Wt 223 lb 8 oz (101.4 kg)   SpO2 99%   BMI 40.88 kg/m  Wt Readings from Last 3 Encounters:  04/12/22 223 lb 8 oz (101.4 kg)  03/14/22 225 lb (102.1 kg)  03/05/22 223 lb (101.2 kg)   Vitals:   04/12/22 1307  BP: 126/72      Health Maintenance Due  Topic Date Due   Medicare Annual Wellness (AWV)  Never done    There are no preventive care reminders to display for this patient.  Lab Results  Component Value Date   TSH 1.80 02/27/2021   Lab Results  Component Value Date   WBC 5.5 02/13/2022   HGB 11.9 (L) 02/13/2022   HCT 36.8 02/13/2022   MCV 88.9 02/13/2022   PLT 204 02/13/2022   Lab Results  Component Value Date   NA 141 02/13/2022   K 3.7 02/13/2022  CO2 29 02/13/2022   GLUCOSE 109 (H) 02/13/2022   BUN 17 02/13/2022   CREATININE 1.03 (H) 02/13/2022   BILITOT 0.5 06/01/2021   ALKPHOS 40 06/01/2021   AST 16 06/01/2021   ALT 13 06/01/2021   PROT 6.7 06/01/2021   ALBUMIN 3.2 (L) 06/01/2021   CALCIUM 9.0 02/13/2022   ANIONGAP 7 02/13/2022   EGFR 69 02/27/2021   GFR 82.53 07/05/2014   Lab Results  Component Value Date   CHOL 158 06/01/2021   Lab Results   Component Value Date   HDL 50 06/01/2021   Lab Results  Component Value Date   LDLCALC 96 06/01/2021   Lab Results  Component Value Date   TRIG 60 06/01/2021   Lab Results  Component Value Date   CHOLHDL 3.2 06/01/2021   Lab Results  Component Value Date   HGBA1C 5.9 (A) 02/27/2021      Assessment & Plan:   1. Hypertension, benign: Chronic and stable. Continue same medications Amlodipine 5 mg, Valsartan-HCTZ 80-12.5 mg daily. Refilled today.   - amLODipine (NORVASC) 5 MG tablet; Take 1 tablet (5 mg total) by mouth daily.  Dispense: 90 tablet; Refill: 0 - valsartan-hydrochlorothiazide (DIOVAN HCT) 80-12.5 MG tablet; Take 1 tablet by mouth daily.  Dispense: 90 tablet; Refill: 0  2. Hx of spontaneous intraparenchymal intracranial hemorrhage/Pure hypercholesterolemia: Patient is doing very well, she just finished with PT /OT and is doing home exercises. She is taking a muscle relaxer as needed but is not on a statin after the SATURN trial. She will due for annual labs at follow up.    Follow-up: Return in 3 months (on 07/12/2022) for AWV w/ nurse.    Margarita Mail, DO

## 2022-04-13 ENCOUNTER — Encounter: Payer: Self-pay | Admitting: Physical Medicine & Rehabilitation

## 2022-04-13 ENCOUNTER — Encounter: Payer: Medicare Other | Attending: Registered Nurse | Admitting: Physical Medicine & Rehabilitation

## 2022-04-13 VITALS — BP 143/88 | HR 76 | Temp 98.9°F | Ht 62.0 in | Wt 223.2 lb

## 2022-04-13 DIAGNOSIS — G8111 Spastic hemiplegia affecting right dominant side: Secondary | ICD-10-CM | POA: Diagnosis not present

## 2022-04-13 MED ORDER — INCOBOTULINUMTOXINA 100 UNITS IM SOLR
300.0000 [IU] | Freq: Once | INTRAMUSCULAR | Status: AC
  Administered 2022-04-13: 300 [IU] via INTRAMUSCULAR

## 2022-04-13 NOTE — Progress Notes (Signed)
Xeomin Injection for spasticity using needle EMG guidance  Dilution: 50 Units/ml Indication: Severe spasticity which interferes with ADL,mobility and/or  hygiene and is unresponsive to medication management and other conservative care Informed consent was obtained after describing risks and benefits of the procedure with the patient. This includes bleeding, bruising, infection, excessive weakness, or medication side effects. A REMS form is on file and signed. Needle: 25g 2" needle electrode Number of units per muscle   Xeomin 50 units/mL RIGHT FHL 75  FDL 75  PT 75  Med Gastr 50  FHB25 All injections were done after obtaining appropriate EMG activity and after negative drawback for blood. The patient tolerated the procedure well. Post procedure instructions were given. A followup appointment was made.   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

## 2022-04-17 ENCOUNTER — Ambulatory Visit: Payer: Medicare Other

## 2022-04-19 ENCOUNTER — Ambulatory Visit: Payer: Medicare Other

## 2022-04-24 ENCOUNTER — Ambulatory Visit: Payer: Medicare Other

## 2022-04-26 ENCOUNTER — Ambulatory Visit: Payer: Medicare Other

## 2022-05-01 ENCOUNTER — Ambulatory Visit: Payer: Medicare Other

## 2022-05-03 ENCOUNTER — Ambulatory Visit: Payer: Medicare Other

## 2022-05-08 ENCOUNTER — Ambulatory Visit: Payer: Medicare Other

## 2022-05-09 ENCOUNTER — Ambulatory Visit: Payer: Medicare Other

## 2022-05-10 ENCOUNTER — Ambulatory Visit: Payer: Medicare Other

## 2022-05-16 ENCOUNTER — Ambulatory Visit: Payer: Medicare Other

## 2022-05-17 ENCOUNTER — Telehealth: Payer: Self-pay | Admitting: Physical Medicine & Rehabilitation

## 2022-05-17 NOTE — Telephone Encounter (Signed)
Patient is needing letter stating she can drive with her right foot and no longer needs to use her left foot.   Attention to Department of Motor Vehicles.

## 2022-05-21 NOTE — Progress Notes (Signed)
Established Patient Office Visit  Subjective:  Patient ID: Terri Werner, female    DOB: May 05, 1951  Age: 72 y.o. MRN: [id number]  CC:  No chief complaint on file.   HPI Terri Werner presents for assessment of lower extremity function/forms.   Patient has history of CVA in early 2023, has been undergoing botox injections and working with PT/OT with good return of function and mobility. Stroke primarily affected her right side and has been using a left foot petal to help her drive. Today she needs a letter for the [facility] that she no longer needs this left foot pedal and that her right foot is reliable when driving.   Past Medical History:  Diagnosis Date   Arthritis    pt reports in back & knees   Colon polyps 2010   At [facility]   Diverticulitis 2016   Genital warts    GERD (gastroesophageal reflux disease)    Heart murmur    Hepatitis B    History of blood transfusion    during each major surgery per pt   History of chicken pox    History of hiatal hernia    History of torn meniscus of right knee    HTN (hypertension)    Hx of migraine headaches    Hypertension    Hypopotassemia    Pre-diabetes    Stroke (HCC) 05/27/2021    Past Surgical History:  Procedure Laterality Date   ABDOMINAL HYSTERECTOMY  2006   APPENDECTOMY  1980   CESAREAN SECTION  1981   COLONOSCOPY WITH PROPOFOL N/A 11/01/2014   Procedure: COLONOSCOPY WITH PROPOFOL;  Surgeon: Scot Jun, MD;  Location: [facility] ENDOSCOPY;  Service: Endoscopy;  Laterality: N/A;   COLONOSCOPY WITH PROPOFOL N/A 01/05/2020   Procedure: COLONOSCOPY WITH PROPOFOL;  Surgeon: Regis Bill, MD;  Location: [facility] ENDOSCOPY;  Service: Endoscopy;  Laterality: N/A;   CYSTOSCOPY WITH BIOPSY N/A 02/19/2022   Procedure: CYSTOSCOPY WITH BLADDER BIOPSY;  Surgeon: Vanna Scotland, MD;  Location: [facility] ORS;  Service: Urology;  Laterality: N/A;   ECTOPIC PREGNANCY SURGERY  1980   HERNIA REPAIR  2006   Umbilical    KNEE ARTHROSCOPY Left 2006   torn meniscus   TONSILLECTOMY  1971   TOTAL ABDOMINAL HYSTERECTOMY W/ BILATERAL SALPINGOOPHORECTOMY  2006   VESICOVAGINAL FISTULA CLOSURE W/ TAH      Family History  Problem Relation Age of Onset   Breast cancer Sister 42   Hypertension Mother    Alzheimer's disease Mother    Dementia Mother    Hypertension Maternal Grandfather    Hyperlipidemia Maternal Grandfather    Birth defects Maternal Grandmother        Bladder   Colon cancer Maternal Grandmother    Congestive Heart Failure Father    Hyperlipidemia Father    Dementia Paternal Grandmother    Dementia Paternal Grandfather    Thyroid disease Sister     Social History   Socioeconomic History   Marital status: Widowed    Spouse name: Not on file   Number of children: 2   Years of education: Not on file   Highest education level: Master's degree (e.g., MA, MS, MEng, MEd, MSW, MBA)  Occupational History   Occupation: [employer]    Comment: Full Time    Employer: [employer]  Tobacco Use   Smoking status: Never   Smokeless tobacco: Never  Vaping Use   Vaping Use: Never used  Substance and Sexual Activity   Alcohol use: Not Currently    Comment: rare   Drug use: No   Sexual activity: Not Currently    Partners: Male  Other Topics Concern   Not on file  Social History Narrative   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      Regular Exercise -  NO   Daily Caffeine Use:  1 soda/tea          Social Determinants of Health   Financial Resource Strain: High Risk (03/05/2018)   Overall Financial Resource Strain (CARDIA)    Difficulty of Paying Living Expenses: Hard  Food Insecurity: No Food Insecurity (03/05/2018)   Hunger Vital Sign    Worried About Running Out of Food in the Last Year: Never true    Ran Out of Food in the Last Year: Never true   Transportation Needs: No Transportation Needs (03/05/2018)   PRAPARE - Administrator, Civil Service (Medical): No    Lack of Transportation (Non-Medical): No  Physical Activity: Insufficiently Active (03/05/2018)   Exercise Vital Sign    Days of Exercise per Week: 3 days    Minutes of Exercise per Session: 20 min  Stress: No Stress Concern Present (03/05/2018)   [employer] of Occupational Health - Occupational Stress Questionnaire    Feeling of Stress : Not at all  Social Connections: Moderately Integrated (03/05/2018)   Social Connection and Isolation Panel [NHANES]    Frequency of Communication with Friends and Family: More than three times a week    Frequency of Social Gatherings with Friends and Family: Twice a week    Attends Religious Services: More than 4 times per year    Active Member of [employer] or Organizations: Yes    Attends Banker Meetings: More than 4 times per year    Marital Status: Widowed  Intimate Partner Violence: Not At Risk (03/05/2018)   Humiliation, Afraid, Rape, and Kick questionnaire    Fear of Current or Ex-Partner: No    Emotionally Abused: No    Physically Abused: No    Sexually Abused: No    Outpatient Medications Prior to Visit  Medication Sig Dispense Refill   acetaminophen (TYLENOL) 325 MG tablet Take 2 tablets (650 mg total) by mouth every 4 (four) hours as needed for mild pain (or temp > 37.5 C (99.5 F)).     acidophilus (RISAQUAD) CAPS capsule Take 1 capsule by mouth daily. 30 capsule 0   amLODipine (NORVASC) 5 MG tablet Take 1 tablet (5 mg total) by mouth daily. 90 tablet 0   Cholecalciferol (VITAMIN D) 50 MCG (2000 UT) tablet Take 1 tablet (2,000 Units total) by mouth daily. 90 tablet 3   cyclobenzaprine (FLEXERIL) 5 MG tablet TAKE 1 TABLET BY MOUTH DAILY AS NEEDED FOR MUSCLE SPASMS. 30 tablet 1   diclofenac Sodium (VOLTAREN) 1 % GEL Apply 4 g topically 4 (four) times daily. 400 g 0   polyethylene glycol powder  (GLYCOLAX/MIRALAX) 17 GM/SCOOP powder Take 17 g by mouth daily as needed for moderate constipation. 3350 g 1   senna-docusate (SENOKOT-S) 8.6-50 MG tablet Take 1 tablet by mouth 2 (two) times daily. 90 tablet 3   valsartan-hydrochlorothiazide (DIOVAN HCT) 80-12.5 MG tablet Take 1 tablet by mouth daily. 90 tablet 0   No facility-administered medications prior to visit.    Allergies  Allergen Reactions   Shrimp [Shellfish  Allergy] Swelling    Lips will burn    Latex Itching    Burning    ROS Review of Systems  Constitutional:  Negative for chills and fever.  Eyes:  Negative for visual disturbance.  Respiratory:  Negative for shortness of breath.   Cardiovascular:  Negative for chest pain and palpitations.  Genitourinary:  Negative for dysuria.  Neurological:  Negative for dizziness and headaches.      Objective:    Physical Exam Constitutional:      Appearance: Normal appearance.     Comments: Walking with cane  HENT:     Head: Normocephalic and atraumatic.  Eyes:     Conjunctiva/sclera: Conjunctivae normal.  Cardiovascular:     Rate and Rhythm: Normal rate and regular rhythm.  Pulmonary:     Effort: Pulmonary effort is normal.     Breath sounds: Normal breath sounds.  Musculoskeletal:     Right lower leg: No edema.     Left lower leg: No edema.  Skin:    General: Skin is warm and dry.  Neurological:     Mental Status: She is alert. Mental status is at baseline.  Psychiatric:        Mood and Affect: Mood normal.        Behavior: Behavior normal.    There were no vitals taken for this visit. Wt Readings from Last 3 Encounters:  04/13/22 223 lb 3.2 oz (101.2 kg)  04/12/22 223 lb 8 oz (101.4 kg)  03/14/22 225 lb (102.1 kg)   There were no vitals filed for this visit.     Health Maintenance Due  Topic Date Due   Medicare Annual Wellness (AWV)  Never done   COVID-19 Vaccine (7 - 2023-24 season) 01/12/2022    There are no preventive care reminders to  display for this patient.  Lab Results  Component Value Date   TSH 1.80 02/27/2021   Lab Results  Component Value Date   WBC 5.5 02/13/2022   HGB 11.9 (L) 02/13/2022   HCT 36.8 02/13/2022   MCV 88.9 02/13/2022   PLT 204 02/13/2022   Lab Results  Component Value Date   NA 141 02/13/2022   K 3.7 02/13/2022   CO2 29 02/13/2022   GLUCOSE 109 (H) 02/13/2022   BUN 17 02/13/2022   CREATININE 1.03 (H) 02/13/2022   BILITOT 0.5 06/01/2021   ALKPHOS 40 06/01/2021   AST 16 06/01/2021   ALT 13 06/01/2021   PROT 6.7 06/01/2021   ALBUMIN 3.2 (L) 06/01/2021   CALCIUM 9.0 02/13/2022   ANIONGAP 7 02/13/2022   EGFR 69 02/27/2021   GFR 82.53 07/05/2014   Lab Results  Component Value Date   CHOL 158 06/01/2021   Lab Results  Component Value Date   HDL 50 06/01/2021   Lab Results  Component Value Date   LDLCALC 96 06/01/2021   Lab Results  Component Value Date   TRIG 60 06/01/2021   Lab Results  Component Value Date   CHOLHDL 3.2 06/01/2021   Lab Results  Component Value Date   HGBA1C 5.9 (A) 02/27/2021      Assessment & Plan:   1. Hypertension, benign: Chronic and stable. Continue same medications Amlodipine 5 mg, Valsartan-HCTZ 80-12.5 mg daily. Refilled today.   - amLODipine (NORVASC) 5 MG tablet; Take 1 tablet (5 mg total) by mouth daily.  Dispense: 90 tablet; Refill: 0 - valsartan-hydrochlorothiazide (DIOVAN HCT) 80-12.5 MG tablet; Take 1 tablet by mouth daily.  Dispense: 90 tablet;  Refill: 0  2. Hx of spontaneous intraparenchymal intracranial hemorrhage/Pure hypercholesterolemia: Patient is doing very well, she just finished with PT /OT and is doing home exercises. She is taking a muscle relaxer as needed but is not on a statin after the SATURN trial. She will due for annual labs at follow up.    Follow-up: No follow-ups on file.    Margarita Mail, DO

## 2022-05-21 NOTE — Telephone Encounter (Signed)
Patient is calling back about getting letter for [facility] that the left pedal driving can be lifted and okay to drive with right foot.  Please call patient.

## 2022-05-21 NOTE — Telephone Encounter (Signed)
Copied from [place] [id number]. Topic: General - Inquiry >> May 21, 2022 11:26 AM Penni Bombard wrote: Reason for CRM: pt called saying she needs a letter for [facility] from DO Teodora Medici saying she is ok to drive with her right foot.  CB@  [phone]

## 2022-05-21 NOTE — Telephone Encounter (Signed)
Pt is scheduled for 05/22/22

## 2022-05-22 ENCOUNTER — Other Ambulatory Visit: Payer: Self-pay

## 2022-05-22 ENCOUNTER — Ambulatory Visit (INDEPENDENT_AMBULATORY_CARE_PROVIDER_SITE_OTHER): Payer: Medicare Other | Admitting: Internal Medicine

## 2022-05-22 ENCOUNTER — Encounter: Payer: Self-pay | Admitting: Internal Medicine

## 2022-05-22 VITALS — BP 128/76 | HR 98 | Temp 98.3°F | Resp 16 | Ht 62.0 in | Wt 214.3 lb

## 2022-05-22 DIAGNOSIS — I10 Essential (primary) hypertension: Secondary | ICD-10-CM | POA: Diagnosis not present

## 2022-05-22 DIAGNOSIS — Z0289 Encounter for other administrative examinations: Secondary | ICD-10-CM | POA: Diagnosis not present

## 2022-05-22 DIAGNOSIS — M170 Bilateral primary osteoarthritis of knee: Secondary | ICD-10-CM | POA: Diagnosis not present

## 2022-05-22 MED ORDER — LIDOCAINE 5 % EX OINT: 1.0000 | TOPICAL_OINTMENT | CUTANEOUS | 2 refills | Status: AC | PRN

## 2022-05-23 ENCOUNTER — Ambulatory Visit: Payer: Medicare Other

## 2022-05-25 ENCOUNTER — Encounter: Payer: Self-pay | Admitting: Physical Medicine & Rehabilitation

## 2022-05-25 ENCOUNTER — Ambulatory Visit: Payer: Medicare Other | Admitting: Physical Medicine & Rehabilitation

## 2022-05-25 ENCOUNTER — Encounter: Payer: Medicare Other | Attending: Registered Nurse | Admitting: Physical Medicine & Rehabilitation

## 2022-05-25 VITALS — BP 125/84 | HR 77 | Ht 62.0 in | Wt 213.0 lb

## 2022-05-25 DIAGNOSIS — M7061 Trochanteric bursitis, right hip: Secondary | ICD-10-CM

## 2022-05-25 MED ORDER — BETAMETHASONE SOD PHOS & ACET 6 (3-3) MG/ML IJ SUSP
6.0000 mg | Freq: Once | INTRAMUSCULAR | Status: AC
  Administered 2022-05-25: 6 mg via INTRAMUSCULAR

## 2022-05-25 MED ORDER — LIDOCAINE HCL 1 % IJ SOLN
4.0000 mL | Freq: Once | INTRAMUSCULAR | Status: AC
  Administered 2022-05-25: 4 mL

## 2022-05-25 NOTE — Patient Instructions (Signed)
Hip Bursitis  Hip bursitis is swelling of one or more fluid-filled sacs (bursae) in your hip joint. If the bursa becomes irritated, it can fill with extra fluid and become swollen. This condition can cause pain, and your symptoms may come and go over time. What are the causes? Repeated use of your hip muscles. Injury to the hip. Weak butt muscles. Bone spurs. Infection. In some cases, the cause may not be known. What increases the risk? Having a past hip injury or hip surgery. Having a condition, such as arthritis, gout, diabetes, or thyroid disease. Having spine problems. Having one leg that is shorter than the other. Running a lot or doing long-distance running. Playing sports where there is a risk of injury or falling, such as football, martial arts, or skiing. What are the signs or symptoms? Symptoms may come and go, and they often include: Pain in the hip or groin area. Pain may get worse when you move your hip. Tenderness and swelling of the hip. In rare cases, the bursa may become infected. If this happens, you may: Get a fever. Have warmth and redness in the hip area. How is this treated? This condition is treated by: Resting your hip. Icing your hip. Wrapping the hip area with an elastic bandage (compression wrap). Keeping the hip raised. Other treatments may include: Using crutches, a cane, or a walker. Medicines. Draining fluid out of the bursa. Surgery to take out a bursa. This is rare. Long-term treatment may include: Doing exercises to help your strength and flexibility. Lifestyle changes like losing weight to lessen the strain on your hip. Follow these instructions at home: Managing pain, stiffness, and swelling     If told, put ice on the painful area. To do this: Put ice in a plastic bag. Place a towel between your skin and the bag. Leave the ice on for 20 minutes, 2-3 times a day. Take off the ice if your skin turns bright red. This is very important.  If you cannot feel pain, heat, or cold, you have a greater risk of damage to the area. Raise your hip by putting a pillow under your hips while you lie down. Stop if you feel pain. If told, put heat on the affected area. Do this as often as told by your doctor. Use the heat source that your doctor recommends, such as a moist heat pack or a heating pad. Place a towel between your skin and the heat source. Leave the heat on for 20-30 minutes. Take off the heat if your skin turns bright red. This is very important. If you cannot feel pain, heat, or cold, you have a greater risk of getting burned. Activity Do not use your hip to support your body weight until your doctor says that you can. Use crutches, a cane, or a walker as told by your doctor. If the affected leg is one that you use to drive, ask your doctor if it is safe to drive. Rest and protect your hip as much as you can until you feel better. Return to your normal activities when your doctor says that it is safe. Do exercises as told by your doctor. General instructions Take over-the-counter and prescription medicines only as told by your doctor. Gently rub and stretch your injured area as often as is comfortable. Wear elastic bandages only as told by your doctor. If one of your legs is shorter than the other, get fitted for a shoe insert or orthotic. Keep a healthy  weight. Follow instructions from your doctor. Keep all follow-up visits. How is this prevented? Exercise regularly or as told by your doctor. Wear the right shoes for the sport you play and for daily activities. Warm up and stretch before being active. Cool down and stretch after being active. Take breaks often from repeated activity. Avoid activities that bother your hip or cause pain. Avoid sitting down for a long time. Where to find more information American Academy of Orthopaedic Surgeons: orthoinfo.aaos.org Contact a doctor if: You have a fever. You have new  symptoms. You have trouble walking or doing everyday activities. You have pain that gets worse or does not get better with medicine. The skin around your hip is red. You get a feeling of warmth in your hip area. Get help right away if: You cannot move your hip. You have very bad pain. You cannot control the muscles in your feet. Summary Hip bursitis is swelling of one or more fluid-filled sacs (bursae) in your hip joint. Symptoms often come and go over time. This condition is often treated by resting and icing the hip. It also may help to keep the area raised and wrapped in an elastic bandage. Other treatments may be needed. This information is not intended to replace advice given to you by your health care provider. Make sure you discuss any questions you have with your health care provider. Document Revised: 04/25/2021 Document Reviewed: 04/25/2021 Elsevier Patient Education  [place].

## 2022-05-25 NOTE — Progress Notes (Signed)
Subjective:    Patient ID: Terri Werner, female    DOB: 1951/01/09, 72 y.o.   MRN: [id number]  HPI  72 year old female with history of left CVA causing right spastic hemiparesis.  Lower extremity has been more spastic than upper.  She has completed outpatient therapy November.  The patient had a fall in November as well aggravated her right hip and low back pain.  She has been seen by orthopedics x-rays were negative.  Her hip pain is mainly on the right lateral aspect her low back issues bilateral.  Pt notes ~40% improvement , after xeomin injection  In right lower extremity tone  04/13/22 Xeomin 50 units/mL RIGHT FHL 75  FDL 75  PT 75  Med Gastr 50  FHB25  Pain Inventory Average Pain 6 Pain Right Now 6 My pain is constant and tingling  LOCATION OF PAIN  Right Leg, Right Knee, Right Toes  BOWEL Number of stools per week: 3 Oral laxative use No  Type of laxative Miralax & Senokot  BLADDER Normal    Mobility use a cane ability to climb steps?  yes do you drive?  yes  Function retired  Neuro/Psych No problems in this area  Prior Studies Any changes since last visit?  no  Physicians involved in your care Any changes since last visit?  no   Family History  Problem Relation Age of Onset   Breast cancer Sister 64   Hypertension Mother    Alzheimer's disease Mother    Dementia Mother    Hypertension Maternal Grandfather    Hyperlipidemia Maternal Grandfather    Birth defects Maternal Grandmother        Bladder   Colon cancer Maternal Grandmother    Congestive Heart Failure Father    Hyperlipidemia Father    Dementia Paternal Grandmother    Dementia Paternal Grandfather    Thyroid disease Sister    Social History   Socioeconomic History   Marital status: Widowed    Spouse name: Not on file   Number of children: 2   Years of education: Not on file   Highest education level: Master's degree (e.g., MA, MS, MEng, MEd, MSW, MBA)   Occupational History   Occupation: [employer]    Comment: Full Time    Employer: [employer]  Tobacco Use   Smoking status: Never   Smokeless tobacco: Never  Vaping Use   Vaping Use: Never used  Substance and Sexual Activity   Alcohol use: Not Currently    Comment: rare   Drug use: No   Sexual activity: Not Currently    Partners: Male  Other Topics Concern   Not on file  Social History Narrative   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      Regular Exercise -  NO   Daily Caffeine Use:  1 soda/tea          Social Determinants of Health   Financial Resource Strain: High Risk (03/05/2018)   Overall Financial Resource Strain (CARDIA)    Difficulty of Paying Living Expenses: Hard  Food Insecurity: No Food Insecurity (03/05/2018)   Hunger Vital Sign    Worried About Running Out of Food in the Last Year: Never true    Ran Out of Food in the Last Year: Never true  Transportation Needs: No Transportation Needs (03/05/2018)   PRAPARE - Transportation  Lack of Transportation (Medical): No    Lack of Transportation (Non-Medical): No  Physical Activity: Insufficiently Active (03/05/2018)   Exercise Vital Sign    Days of Exercise per Week: 3 days    Minutes of Exercise per Session: 20 min  Stress: No Stress Concern Present (03/05/2018)   [employer] of Occupational Health - Occupational Stress Questionnaire    Feeling of Stress : Not at all  Social Connections: Moderately Integrated (03/05/2018)   Social Connection and Isolation Panel [NHANES]    Frequency of Communication with Friends and Family: More than three times a week    Frequency of Social Gatherings with Friends and Family: Twice a week    Attends Religious Services: More than 4 times per year    Active Member of [employer] or Organizations: Yes    Attends Banker  Meetings: More than 4 times per year    Marital Status: Widowed   Past Surgical History:  Procedure Laterality Date   ABDOMINAL HYSTERECTOMY  2006   APPENDECTOMY  1980   CESAREAN SECTION  1981   COLONOSCOPY WITH PROPOFOL N/A 11/01/2014   Procedure: COLONOSCOPY WITH PROPOFOL;  Surgeon: Scot Jun, MD;  Location: [facility] ENDOSCOPY;  Service: Endoscopy;  Laterality: N/A;   COLONOSCOPY WITH PROPOFOL N/A 01/05/2020   Procedure: COLONOSCOPY WITH PROPOFOL;  Surgeon: Regis Bill, MD;  Location: [facility] ENDOSCOPY;  Service: Endoscopy;  Laterality: N/A;   CYSTOSCOPY WITH BIOPSY N/A 02/19/2022   Procedure: CYSTOSCOPY WITH BLADDER BIOPSY;  Surgeon: Vanna Scotland, MD;  Location: [facility] ORS;  Service: Urology;  Laterality: N/A;   ECTOPIC PREGNANCY SURGERY  1980   HERNIA REPAIR  2006   Umbilical   KNEE ARTHROSCOPY Left 2006   torn meniscus   TONSILLECTOMY  1971   TOTAL ABDOMINAL HYSTERECTOMY W/ BILATERAL SALPINGOOPHORECTOMY  2006   VESICOVAGINAL FISTULA CLOSURE W/ TAH     Past Medical History:  Diagnosis Date   Arthritis    pt reports in back & knees   Colon polyps 2010   At [facility]   Diverticulitis 2016   Genital warts    GERD (gastroesophageal reflux disease)    Heart murmur    Hepatitis B    History of blood transfusion    during each major surgery per pt   History of chicken pox    History of hiatal hernia    History of torn meniscus of right knee    HTN (hypertension)    Hx of migraine headaches    Hypertension    Hypopotassemia    Pre-diabetes    Stroke (HCC) 05/27/2021   BP 125/84   Pulse 77   Ht 5\' 2"  (1.575 m)   Wt 213 lb (96.6 kg)   SpO2 98%   BMI 38.96 kg/m   Opioid Risk Score:   Fall Risk Score:  `1  Depression screen [facility] 2/9     05/25/2022   12:03 PM 05/22/2022    8:27 AM 04/13/2022   11:13 AM 02/27/2022    2:43 PM 12/21/2021   11:05 AM 11/23/2021   11:51 AM 11/06/2021   11:24 AM  Depression screen PHQ 2/9  Decreased Interest 0 0 0 0 0 0 0  Down,  Depressed, Hopeless 0 0 0 0 0 0 0  PHQ - 2 Score 0 0 0 0 0 0 0  Altered sleeping     0  0  Tired, decreased energy     0  0  Change in appetite  0  0  Feeling bad or failure about yourself      0  0  Trouble concentrating     0  0  Moving slowly or fidgety/restless     0  0  Suicidal thoughts     0  0  PHQ-9 Score     0  0  Difficult doing work/chores     Not difficult at all  Not difficult at all    Review of Systems  Musculoskeletal:  Positive for gait problem.  All other systems reviewed and are negative.      Objective:   Physical Exam  Positive tenderness over the right greater trochanter.  No tenderness in the lumbar paraspinal area Negative straight leg raising bilaterally Ambulates with a cane there is a tendency towards toe drag as well as foot inversion although she is able to clear her toes without hip hiking. No evidence of clonus at the ankle With standing and socks off she does have some toe curling noted at the hallux as well as the small toes.      Assessment & Plan:  1.  Right spastic hemiplegia functionally impairing gait.  Will repeat Xeomin injection in approximately 6 to 7 weeks will increase dose by 100 units and put the extra in the gastrocnemius and soleus muscle groups  2.  Trochanteric bursitis right hip will perform right palpation guided injection today Trochanteric bursa injection With or without ultrasound guidance  Indication Trochanteric bursitis. Exam has tenderness over the greater trochanter of the hip. Pain has not responded to conservative care such as exercise therapy and oral medications. Pain interferes with sleep or with mobility Informed consent was obtained after describing risks and benefits of the procedure with the patient these include bleeding bruising and infection. Patient has signed written consent form. Patient placed in a lateral decubitus position with the affected hip superior. Point of maximal pain was palpated marked  and prepped with Betadine and entered with a needle to bone contact. Needle slightly withdrawn then 6mg  of betamethasone with 4 cc 1% lidocaine were injected. Patient tolerated procedure well. Post procedure instructions given.

## 2022-05-30 ENCOUNTER — Ambulatory Visit: Payer: Medicare Other

## 2022-06-01 ENCOUNTER — Ambulatory Visit: Payer: Medicare Other | Admitting: Physical Medicine & Rehabilitation

## 2022-06-06 ENCOUNTER — Ambulatory Visit: Payer: Medicare Other

## 2022-06-13 ENCOUNTER — Ambulatory Visit: Payer: Medicare Other

## 2022-06-20 ENCOUNTER — Ambulatory Visit: Payer: Medicare Other

## 2022-06-27 ENCOUNTER — Ambulatory Visit: Payer: Medicare Other

## 2022-07-04 ENCOUNTER — Ambulatory Visit: Payer: Medicare Other

## 2022-07-11 ENCOUNTER — Ambulatory Visit: Payer: Medicare Other

## 2022-07-13 ENCOUNTER — Encounter: Payer: Self-pay | Admitting: Physical Medicine & Rehabilitation

## 2022-07-13 ENCOUNTER — Encounter: Payer: Medicare Other | Attending: Registered Nurse | Admitting: Physical Medicine & Rehabilitation

## 2022-07-13 VITALS — Ht 62.0 in | Wt 216.0 lb

## 2022-07-13 DIAGNOSIS — G8111 Spastic hemiplegia affecting right dominant side: Secondary | ICD-10-CM | POA: Insufficient documentation

## 2022-07-13 MED ORDER — INCOBOTULINUMTOXINA 100 UNITS IM SOLR
400.0000 [IU] | Freq: Once | INTRAMUSCULAR | Status: AC
  Administered 2022-07-13: 400 [IU] via INTRAMUSCULAR

## 2022-07-13 NOTE — Progress Notes (Signed)
BOTOX Injection for spasticity using needle EMG guidance  Dilution: 50 Units/ml Indication: Severe spasticity which interferes with ADL,mobility and/or  hygiene and is unresponsive to medication management and other conservative care Informed consent was obtained after describing risks and benefits of the procedure with the patient. This includes bleeding, bruising, infection, excessive weakness, or medication side effects. A REMS form is on file and signed. Needle: 25g 2" needle electrode Number of units per muscle  BOTOX  50 units/mL RIGHT FHL 75  FDL 75  PT 75  Med Gastr 50  Lat Gastr 50 Med soleus 50 FHB25 All injections were done after obtaining appropriate EMG activity and after negative drawback for blood. The patient tolerated the procedure well. Post procedure instructions were given. A followup appointment was made.

## 2022-07-13 NOTE — Patient Instructions (Signed)

## 2022-07-16 ENCOUNTER — Ambulatory Visit: Payer: Medicare Other | Admitting: Physician Assistant

## 2022-07-16 VITALS — BP 128/81 | HR 89

## 2022-07-16 DIAGNOSIS — N321 Vesicointestinal fistula: Secondary | ICD-10-CM

## 2022-07-16 DIAGNOSIS — R350 Frequency of micturition: Secondary | ICD-10-CM | POA: Diagnosis not present

## 2022-07-16 LAB — URINALYSIS, COMPLETE
Bilirubin, UA: NEGATIVE
Glucose, UA: NEGATIVE
Ketones, UA: NEGATIVE
Leukocytes,UA: NEGATIVE
Nitrite, UA: NEGATIVE
Protein,UA: NEGATIVE
RBC, UA: NEGATIVE
Specific Gravity, UA: 1.025 (ref 1.005–1.030)
Urobilinogen, Ur: 0.2 mg/dL (ref 0.2–1.0)
pH, UA: 5 (ref 5.0–7.5)

## 2022-07-16 LAB — MICROSCOPIC EXAMINATION

## 2022-07-16 NOTE — Progress Notes (Signed)
07/16/2022 2:23 PM   Terri Werner 1950-05-25 [id number]  CC: Chief Complaint  Patient presents with   Follow-up   HPI: Terri Werner is a 72 y.o. female with PMH chronic colovesical fistula who presents today for follow-up.   Today she reports she continues to have intermittent pneumaturia, especially when she is constipated.  She denies dysuria or gross hematuria.  She also has some occasional urinary urgency, but no acute concerns today.  In-office UA and microscopy today pan negative.  PMH: Past Medical History:  Diagnosis Date   Arthritis    pt reports in back & knees   Colon polyps 2010   At [facility]   Diverticulitis 2016   Genital warts    GERD (gastroesophageal reflux disease)    Heart murmur    Hepatitis B    History of blood transfusion    during each major surgery per pt   History of chicken pox    History of hiatal hernia    History of torn meniscus of right knee    HTN (hypertension)    Hx of migraine headaches    Hypertension    Hypopotassemia    Pre-diabetes    Stroke (HCC) 05/27/2021    Surgical History: Past Surgical History:  Procedure Laterality Date   ABDOMINAL HYSTERECTOMY  2006   APPENDECTOMY  1980   CESAREAN SECTION  1981   COLONOSCOPY WITH PROPOFOL N/A 11/01/2014   Procedure: COLONOSCOPY WITH PROPOFOL;  Surgeon: Scot Jun, MD;  Location: [facility] ENDOSCOPY;  Service: Endoscopy;  Laterality: N/A;   COLONOSCOPY WITH PROPOFOL N/A 01/05/2020   Procedure: COLONOSCOPY WITH PROPOFOL;  Surgeon: Regis Bill, MD;  Location: [facility] ENDOSCOPY;  Service: Endoscopy;  Laterality: N/A;   CYSTOSCOPY WITH BIOPSY N/A 02/19/2022   Procedure: CYSTOSCOPY WITH BLADDER BIOPSY;  Surgeon: Vanna Scotland, MD;  Location: [facility] ORS;  Service: Urology;  Laterality: N/A;   ECTOPIC PREGNANCY SURGERY  1980   HERNIA REPAIR  2006   Umbilical   KNEE ARTHROSCOPY Left 2006   torn meniscus   TONSILLECTOMY  1971   TOTAL ABDOMINAL HYSTERECTOMY  W/ BILATERAL SALPINGOOPHORECTOMY  2006   VESICOVAGINAL FISTULA CLOSURE W/ TAH      Home Medications:  Allergies as of 07/16/2022       Reactions   Latex Itching, Rash, Anaphylaxis   Burning   Shellfish-derived Products Anaphylaxis   Shrimp [shellfish Allergy] Swelling   Lips will burn        Medication List        Accurate as of July 16, 2022  2:23 PM. If you have any questions, ask your nurse or doctor.          acetaminophen 325 MG tablet Commonly known as: TYLENOL Take 2 tablets (650 mg total) by mouth every 4 (four) hours as needed for mild pain (or temp > 37.5 C (99.5 F)).   acidophilus Caps capsule Take 1 capsule by mouth daily.   amLODipine 5 MG tablet Commonly known as: NORVASC 1 tablet Orally Once a day for 30 day(s)   cyclobenzaprine 5 MG tablet Commonly known as: FLEXERIL 1 tablet at bedtime as needed Orally Once a day   diclofenac Sodium 1 % Gel Commonly known as: VOLTAREN Apply 4 g topically 4 (four) times daily.   gabapentin 300 MG capsule Commonly known as: NEURONTIN 1 capsule Orally Once a day   lidocaine 5 % ointment Commonly known as: XYLOCAINE Apply 1 Application topically as needed.   polyethylene glycol  powder 17 GM/SCOOP powder Commonly known as: GLYCOLAX/MIRALAX Take 17 g by mouth daily as needed for moderate constipation.   senna-docusate 8.6-50 MG tablet Commonly known as: Senokot-S Take 1 tablet by mouth 2 (two) times daily.   valsartan-hydrochlorothiazide 80-12.5 MG tablet Commonly known as: DIOVAN-HCT 1 tablet Orally Once a day   Vitamin D (Ergocalciferol) 50000 units Caps 1 capsule Orally for 30 day(s)   Vitamin D 50 MCG (2000 UT) tablet Take 1 tablet (2,000 Units total) by mouth daily.        Allergies:  Allergies  Allergen Reactions   Latex Itching, Rash and Anaphylaxis    Burning   Shellfish-Derived Products Anaphylaxis   Shrimp [Shellfish Allergy] Swelling    Lips will burn     Family  History: Family History  Problem Relation Age of Onset   Breast cancer Sister 35   Hypertension Mother    Alzheimer's disease Mother    Dementia Mother    Hypertension Maternal Grandfather    Hyperlipidemia Maternal Grandfather    Birth defects Maternal Grandmother        Bladder   Colon cancer Maternal Grandmother    Congestive Heart Failure Father    Hyperlipidemia Father    Dementia Paternal Grandmother    Dementia Paternal Grandfather    Thyroid disease Sister     Social History:   reports that she has never smoked. She has never used smokeless tobacco. She reports that she does not currently use alcohol. She reports that she does not use drugs.  Physical Exam: BP 128/81   Pulse 89   Constitutional:  Alert and oriented, no acute distress, nontoxic appearing HEENT: [place], AT Cardiovascular: No clubbing, cyanosis, or edema Respiratory: Normal respiratory effort, no increased work of breathing Skin: No rashes, bruises or suspicious lesions Neurologic: Grossly intact, no focal deficits, moving all 4 extremities Psychiatric: Normal mood and affect  Laboratory Data: Results for orders placed or performed in visit on 07/16/22  Microscopic Examination   Urine  Result Value Ref Range   WBC, UA 0-5 0 - 5 /hpf   RBC, Urine 0-2 0 - 2 /hpf   Epithelial Cells (non renal) 0-10 0 - 10 /hpf   Bacteria, UA Few None seen/Few  Urinalysis, Complete  Result Value Ref Range   Specific Gravity, UA 1.025 1.005 - 1.030   pH, UA 5.0 5.0 - 7.5   Color, UA Yellow Yellow   Appearance Ur Clear Clear   Leukocytes,UA Negative Negative   Protein,UA Negative Negative/Trace   Glucose, UA Negative Negative   Ketones, UA Negative Negative   RBC, UA Negative Negative   Bilirubin, UA Negative Negative   Urobilinogen, Ur 0.2 0.2 - 1.0 mg/dL   Nitrite, UA Negative Negative   Microscopic Examination See below:    Assessment & Plan:   1. Colovesical fistula Chronic, minimally symptomatic.  Symptoms  limited largely to pneumaturia.  UA today is bland.  Will continue to monitor and consider general surgery consult in the future if she becomes increasingly symptomatic or has recurrent UTIs. - Urinalysis, Complete  Return in about 6 months (around 01/16/2023) for Symptom recheck and UA.  Carman Ching, PA-C  [facility] [address] [place], [place] [address] [phone]

## 2022-07-18 ENCOUNTER — Ambulatory Visit: Payer: Medicare Other

## 2022-07-19 ENCOUNTER — Ambulatory Visit (INDEPENDENT_AMBULATORY_CARE_PROVIDER_SITE_OTHER): Payer: Medicare Other

## 2022-07-19 VITALS — BP 110/62 | Ht 62.0 in | Wt 217.9 lb

## 2022-07-19 DIAGNOSIS — Z0000 Encounter for general adult medical examination without abnormal findings: Secondary | ICD-10-CM | POA: Diagnosis not present

## 2022-07-19 NOTE — Progress Notes (Signed)
Subjective:   Terri Werner is a 72 y.o. female who presents for Medicare Annual (Subsequent) preventive examination.  Review of Systems     Cardiac Risk Factors include: advanced age (>46mn, >>23women);dyslipidemia;hypertension;obesity (BMI >30kg/m2)     :    Today's Vitals   07/19/22 1306  BP: 110/62  Weight: 217 lb 14.4 oz (98.8 kg)  Height: '5\' 2"'$  (1.575 m)  PainSc: 8    Body mass index is 39.85 kg/m.     07/19/2022    1:25 PM 02/27/2022    2:42 PM 02/19/2022    9:04 AM 02/09/2022   11:11 AM 11/09/2021    8:28 AM 09/14/2021    8:07 AM 05/31/2021   12:45 PM  Advanced Directives  Does Patient Have a Medical Advance Directive? Yes Yes Yes Yes Yes Yes Yes  Type of ACorporate treasurerof AStocktonLiving will HMariaville LakeLiving will HLibertyLiving will HBremenLiving will HKentonLiving will HSunol Does patient want to make changes to medical advance directive?   No - Patient declined  No - Patient declined  No - Patient declined  Copy of HBriggsin Chart?   No - copy requested    No - copy requested  Would patient like information on creating a medical advance directive?       No - Patient declined    Current Medications (verified) Outpatient Encounter Medications as of 07/19/2022  Medication Sig   acetaminophen (TYLENOL) 325 MG tablet Take 2 tablets (650 mg total) by mouth every 4 (four) hours as needed for mild pain (or temp > 37.5 C (99.5 F)).   acidophilus (RISAQUAD) CAPS capsule Take 1 capsule by mouth daily.   amLODipine (NORVASC) 5 MG tablet 1 tablet Orally Once a day for 30 day(s)   Cholecalciferol (VITAMIN D) 50 MCG (2000 UT) tablet Take 1 tablet (2,000 Units total) by mouth daily.   cyclobenzaprine (FLEXERIL) 5 MG tablet 1 tablet at bedtime as needed Orally Once a day   diclofenac Sodium (VOLTAREN) 1 % GEL Apply 4 g  topically 4 (four) times daily.   gabapentin (NEURONTIN) 300 MG capsule 1 capsule Orally Once a day   lidocaine (XYLOCAINE) 5 % ointment Apply 1 Application topically as needed.   polyethylene glycol powder (GLYCOLAX/MIRALAX) 17 GM/SCOOP powder Take 17 g by mouth daily as needed for moderate constipation.   senna-docusate (SENOKOT-S) 8.6-50 MG tablet Take 1 tablet by mouth 2 (two) times daily.   valsartan-hydrochlorothiazide (DIOVAN-HCT) 80-12.5 MG tablet 1 tablet Orally Once a day   Vitamin D, Ergocalciferol, 50000 units CAPS 1 capsule Orally for 30 day(s)   No facility-administered encounter medications on file as of 07/19/2022.    Allergies (verified) Latex, Shellfish-derived products, and Shrimp [shellfish allergy]   History: Past Medical History:  Diagnosis Date   Arthritis    pt reports in back & knees   [facility]  Diverticulitis 2016   Genital warts    GERD (gastroesophageal reflux disease)    Heart murmur    Hepatitis B    History of blood transfusion    during each major surgery per pt   History of chicken pox    History of hiatal hernia    History of torn meniscus of right knee    HTN (hypertension)    Hx of migraine headaches    Hypertension  Hypopotassemia    Pre-diabetes    Stroke ([place]) 05/27/2021   Past Surgical History:  Procedure Laterality Date   ABDOMINAL HYSTERECTOMY  2006   APPENDECTOMY  1980   CESAREAN SECTION  1981   COLONOSCOPY WITH PROPOFOL N/A 11/01/2014   Procedure: COLONOSCOPY WITH PROPOFOL;  Surgeon: Manya Silvas, MD;  Location: [facility] ENDOSCOPY;  Service: Endoscopy;  Laterality: N/A;   COLONOSCOPY WITH PROPOFOL N/A 01/05/2020   Procedure: COLONOSCOPY WITH PROPOFOL;  Surgeon: [name], MD;  Location: [facility] ENDOSCOPY;  Service: Endoscopy;  Laterality: N/A;   CYSTOSCOPY WITH BIOPSY N/A 02/19/2022   Procedure: CYSTOSCOPY WITH BLADDER BIOPSY;  Surgeon: Hollice Espy, MD;  Location: [facility] ORS;  Service: Urology;   Laterality: N/A;   ECTOPIC PREGNANCY SURGERY  1980   HERNIA REPAIR  [id number]   Umbilical   KNEE ARTHROSCOPY Left 2006   torn meniscus   TONSILLECTOMY  1971   TOTAL ABDOMINAL HYSTERECTOMY W/ BILATERAL SALPINGOOPHORECTOMY  2006   VESICOVAGINAL FISTULA CLOSURE W/ TAH     Family History  Problem Relation Age of Onset   Breast cancer Sister 62   Hypertension Mother    Alzheimer's disease Mother    Dementia Mother    Hypertension Maternal Grandfather    Hyperlipidemia Maternal Grandfather    Birth defects Maternal Grandmother        Bladder   Colon cancer Maternal Grandmother    Congestive Heart Failure Father    Hyperlipidemia Father    Dementia Paternal Grandmother    Dementia Paternal Grandfather    Thyroid disease Sister    Social History   Socioeconomic History   Marital status: Widowed    Spouse name: Not on file   Number of children: 2   Years of education: Not on file   Highest education level: Master's degree (e.g., MA, MS, MEng, MEd, MSW, MBA)  Occupational History   Occupation: H[employer] [place]    Comment: Full Time    Employer: [employer]  Tobacco Use   Smoking status: Never   Smokeless tobacco: Never  Vaping Use   Vaping Use: Never used  Substance and Sexual Activity   Alcohol use: Not Currently    Comment: rare   Drug use: No   Sexual activity: Not Currently    Partners: Male  Other Topics Concern   Not on file  Social History Narrative   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      Regular Exercise -  NO   Daily Caffeine Use:  1 soda/tea          Social Determinants of Health   Financial Resource Strain: Medium Risk (07/19/2022)   Overall Financial Resource Strain (CARDIA)    Difficulty of Paying Living Expenses: Somewhat hard  Food Insecurity: No Food Insecurity (07/19/2022)   Hunger Vital Sign    Worried About Running  Out of Food in the Last Year: Never true    Ran Out of Food in the Last Year: Never true  Transportation Needs: No Transportation Needs (07/19/2022)   PRAPARE - Hydrologist (Medical): No    Lack of Transportation (Non-Medical): No  Physical Activity: Unknown (07/19/2022)   Exercise Vital Sign    Days of Exercise per Week: 3 days    Minutes of Exercise per Session: Not on file  Stress: No Stress Concern Present (07/19/2022)  [employer] - Occupational Stress Questionnaire    Feeling of Stress : Not at all  Social Connections: Moderately Integrated (07/19/2022)   Social Connection and Isolation Panel [NHANES]    Frequency of Communication with Friends and Family: More than three times a week    Frequency of Social Gatherings with Friends and Family: More than three times a week    Attends Religious Services: More than 4 times per year    Active Member of [employer] or Organizations: Yes    Attends Archivist Meetings: More than 4 times per year    Marital Status: Widowed    Tobacco Counseling Counseling given: Not Answered   Clinical Intake:  Pre-visit preparation completed: Yes  Pain : 0-10 Pain Score: 8  Pain Type: Acute pain Pain Location: Leg Pain Orientation: Left Pain Descriptors / Indicators: Aching, Sore Pain Onset: In the past 7 days Pain Frequency: Constant     BMI - recorded: 39.85 Nutritional Status: BMI > 30  Obese Nutritional Risks: None Diabetes: No  How often do you need to have someone help you when you read instructions, pamphlets, or other written materials from your doctor or pharmacy?: 1 - Never  Diabetic?no  Interpreter Needed?: No  Information entered by :: B.Gianina Olinde,LPN   Activities of Daily Living    07/19/2022    1:25 PM 05/22/2022    8:27 AM  In your present state of health, do you have any difficulty performing the following activities:  Hearing? 0 0  Vision? 0 0  Difficulty  concentrating or making decisions? 0 0  Walking or climbing stairs? 1 1  Dressing or bathing? 0 0  Doing errands, shopping? 0 0  Preparing Food and eating ? N   Using the Toilet? N   In the past six months, have you accidently leaked urine? Y   Do you have problems with loss of bowel control? N   Managing your Medications? N   Managing your Finances? N   Housekeeping or managing your Housekeeping? N     Patient Care Team: Teodora Medici, DO as PCP - General (Internal Medicine) Rockey Situ Kathlene November, MD as Consulting Physician (Cardiology) Gillis Santa, MD as Consulting Physician (Pain Medicine) Lucky Cowboy Erskine Squibb, MD as Referring Physician (Vascular Surgery) Manya Silvas, MD (Inactive) (Gastroenterology)  Indicate any recent Medical Services you may have received from other than [facility] providers in the past year (date may be approximate).     Assessment:   This is a routine wellness examination for Terri Werner.  Hearing/Vision screen Hearing Screening - Comments:: Adequate hearing Vision Screening - Comments:: Adequate vision w/glasses [facility]  Dietary issues and exercise activities discussed: Exercise limited by: orthopedic condition(s)   Goals Addressed   None    Depression Screen    07/19/2022    1:15 PM 07/13/2022    9:57 AM 05/25/2022   12:03 PM 05/22/2022    8:27 AM 04/13/2022   11:13 AM 02/27/2022    2:43 PM 12/21/2021   11:05 AM  PHQ 2/9 Scores  PHQ - 2 Score 0 0 0 0 0 0 0  PHQ- 9 Score       0    Fall Risk    07/19/2022    1:11 PM 07/13/2022    9:57 AM 05/25/2022   12:03 PM 05/22/2022    8:27 AM 04/13/2022   11:13 AM  [place] in the past year? 1 0 0 0 0  Number falls in past yr: 0 0 0 0   Injury with Fall? 0 0  0   Risk for fall due to : Impaired balance/gait      Follow up Education provided;Falls prevention discussed   Falls evaluation completed     FALL RISK PREVENTION PERTAINING TO THE HOME:  Any stairs in or around the home? Yes  4 steps outside If so, are there any without handrails? Yes  Home free of loose throw rugs in walkways, pet beds, electrical cords, etc? Yes  Adequate lighting in your home to reduce risk of falls? Yes   ASSISTIVE DEVICES UTILIZED TO PREVENT FALLS:  Life alert? No  Use of a cane, walker or w/c? Yes cane; walker every now and then Grab bars in the bathroom? Yes  Shower chair or bench in shower? Yes  Elevated toilet seat or a handicapped toilet? Yes   TIMED UP AND GO:  Was the test performed? Yes .  Length of time to ambulate 10 feet: 13 sec.   Gait slow and steady without use of assistive device  Cognitive Function:        07/19/2022    1:29 PM  6CIT Screen  What Year? 0 points  What month? 0 points  What time? 0 points  Count back from 20 0 points  Months in reverse 0 points  Repeat phrase 0 points  Total Score 0 points    Immunizations Immunization History  Administered Date(s) Administered   Fluad Quad(high Dose 65+) 01/13/2019   Hepatitis A, Adult 09/11/2016, 03/22/2017   Influenza Split 04/14/2011   Influenza,inj,Quad PF,6+ Mos 01/18/2017   Influenza-Unspecified 03/06/2015, 01/29/2018, 12/12/2020, 02/09/2022   MODERNA COVID-19 SARS-COV-2 PEDS BIVALENT BOOSTER 6Y-11Y 05/25/2019, 06/22/2019, 03/29/2020   Meningococcal Conjugate 09/11/2016   Moderna Sars-Covid-2 Vaccination 05/25/2019, 06/22/2019, 03/29/2020   Pneumococcal Conjugate-13 03/05/2018   Pneumococcal Polysaccharide-23 02/18/2013, 04/17/2019   RSV,unspecified 07/08/2022   Tdap 07/06/2015   Typhoid Inactivated 09/11/2016   Yellow Fever 09/11/2016   Zoster Recombinat (Shingrix) 05/26/2022   Zoster, Live 07/06/2015    TDAP status: Up to date  Flu Vaccine status: Up to date  Pneumococcal vaccine status: Up to date  Covid-19 vaccine status: Completed vaccines  Qualifies for Shingles Vaccine? Yes   Zostavax completed Yes   Shingrix Completed?: Yes#2 due soon  Screening Tests Health Maintenance   Topic Date Due   COVID-19 Vaccine (7 - 2023-24 season) 01/12/2022   Zoster Vaccines- Shingrix (2 of 2) 07/21/2022   Medicare Annual Wellness (AWV)  07/19/2023   MAMMOGRAM  12/01/2023   DTaP/Tdap/Td (2 - Td or Tdap) 07/05/2025   COLONOSCOPY (Pts 45-30yr Insurance coverage will need to be confirmed)  05/24/2026   Pneumonia Vaccine 72 Years old  Completed   INFLUENZA VACCINE  Completed   DEXA SCAN  Completed   Hepatitis C Screening  Completed   HPV VACCINES  Aged Out    Health Maintenance  Health Maintenance Due  Topic Date Due   COVID-19 Vaccine (7 - 2023-24 season) 01/12/2022    Colorectal cancer screening: Type of screening: Colonoscopy. Completed yes. Repeat every 10 years  Mammogram status: Completed yes. Repeat every year  Bone Density status: Completed yes. Results reflect: Bone density results: NORMAL. Repeat every 5 years.  Lung Cancer Screening: (Low Dose CT Chest recommended if Age 71-80years, 30 pack-year currently smoking OR have quit w/in 15years.) does not qualify.   Lung Cancer Screening Referral: no  Additional Screening:  Hepatitis C Screening: does not qualify;  Completed yes  Vision Screening: Recommended annual ophthalmology exams for early detection of glaucoma and other disorders of the eye. Is the patient up to date with their annual eye exam?  Yes  Who is the provider or what is the name of the office in which the patient attends annual eye exams? [facility] If pt is not established with a provider, would they like to be referred to a provider to establish care? No .   Dental Screening: Recommended annual dental exams for proper oral hygiene  Community Resource Referral / Chronic Care Management: CRR required this visit?  No   CCM required this visit?  No      Plan:     I have personally reviewed and noted the following in the patient's chart:   Medical and social history Use of alcohol, tobacco or illicit drugs  Current  medications and supplements including opioid prescriptions. Patient is not currently taking opioid prescriptions. Functional ability and status Nutritional status Physical activity Advanced directives List of other physicians Hospitalizations, surgeries, and ER visits in previous 12 months Vitals Screenings to include cognitive, depression, and falls Referrals and appointments  In addition, I have reviewed and discussed with patient certain preventive protocols, quality metrics, and best practice recommendations. A written personalized care plan for preventive services as well as general preventive health recommendations were provided to patient.     Roger Shelter, LPN   [id number]   Nurse Notes: pt presents ambulating w/4-prong cane and slight limp. Pt indicates the bathroom tissue holder feel against her leg on Tuesday this week. She indicates is hurts and very sore. Pt stated area was swollen but decreased some now. Small bruise noted on top of pt right leg (distal). I encouraged pt to call our office if for appt if area worsens (swelling returns, fever, area warm to touch, redness or any drainage appears. Pt concurs she will do.

## 2022-07-19 NOTE — Patient Instructions (Signed)
Terri Werner , Thank you for taking time to come for your Medicare Wellness Visit. I appreciate your ongoing commitment to your health goals. Please review the following plan we discussed and let me know if I can assist you in the future.   These are the goals we discussed:  Goals   None     This is a list of the screening recommended for you and due dates:  Health Maintenance  Topic Date Due   COVID-19 Vaccine (7 - 2023-24 season) 01/12/2022   Zoster (Shingles) Vaccine (2 of 2) 07/21/2022   Medicare Annual Wellness Visit  07/19/2023   Mammogram  12/01/2023   DTaP/Tdap/Td vaccine (2 - Td or Tdap) 07/05/2025   Colon Cancer Screening  05/24/2026   Pneumonia Vaccine  Completed   Flu Shot  Completed   DEXA scan (bone density measurement)  Completed   Hepatitis C Screening: USPSTF Recommendation to screen - Ages 28-79 yo.  Completed   HPV Vaccine  Aged Out    Advanced directives: yes  Conditions/risks identified: low falls risk  Next appointment: Follow up in one year for your annual wellness visit 07/25/2023 '@1pm'$  in person   Preventive Care 65 Years and Older, Female Preventive care refers to lifestyle choices and visits with your health care provider that can promote health and wellness. What does preventive care include? A yearly physical exam. This is also called an annual well check. Dental exams once or twice a year. Routine eye exams. Ask your health care provider how often you should have your eyes checked. Personal lifestyle choices, including: Daily care of your teeth and gums. Regular physical activity. Eating a healthy diet. Avoiding tobacco and drug use. Limiting alcohol use. Practicing safe sex. Taking low-dose aspirin every day. Taking vitamin and mineral supplements as recommended by your health care provider. What happens during an annual well check? The services and screenings done by your health care provider during your annual well check will  depend on your age, overall health, lifestyle risk factors, and family history of disease. Counseling  Your health care provider may ask you questions about your: Alcohol use. Tobacco use. Drug use. Emotional well-being. Home and relationship well-being. Sexual activity. Eating habits. History of falls. Memory and ability to understand (cognition). Work and work Statistician. Reproductive health. Screening  You may have the following tests or measurements: Height, weight, and BMI. Blood pressure. Lipid and cholesterol levels. These may be checked every 5 years, or more frequently if you are over 43 years old. Skin check. Lung cancer screening. You may have this screening every year starting at age 85 if you have a 30-pack-year history of smoking and currently smoke or have quit within the past 15 years. Fecal occult blood test (FOBT) of the stool. You may have this test every year starting at age 15. Flexible sigmoidoscopy or colonoscopy. You may have a sigmoidoscopy every 5 years or a colonoscopy every 10 years starting at age 74. Hepatitis C blood test. Hepatitis B blood test. Sexually transmitted disease (STD) testing. Diabetes screening. This is done by checking your blood sugar (glucose) after you have not eaten for a while (fasting). You may have this done every 1-3 years. Bone density scan. This is done to screen for osteoporosis. You may have this done starting at age 74. Mammogram. This may be done every 1-2 years. Talk to your health care provider about how often you should have regular mammograms. Talk with your health care provider about your test results,  treatment options, and if necessary, the need for more tests. Vaccines  Your health care provider may recommend certain vaccines, such as: Influenza vaccine. This is recommended every year. Tetanus, diphtheria, and acellular pertussis (Tdap, Td) vaccine. You may need a Td booster every 10 years. Zoster vaccine. You may  need this after age 35. Pneumococcal 13-valent conjugate (PCV13) vaccine. One dose is recommended after age 42. Pneumococcal polysaccharide (PPSV23) vaccine. One dose is recommended after age 67. Talk to your health care provider about which screenings and vaccines you need and how often you need them. This information is not intended to replace advice given to you by your health care provider. Make sure you discuss any questions you have with your health care provider. Document Released: 05/27/2015 Document Revised: 01/18/2016 Document Reviewed: 03/01/2015 Elsevier Interactive Patient Education  2017 [employer] Prevention in the Home Falls can cause injuries. They can happen to people of all ages. There are many things you can do to make your home safe and to help prevent falls. What can I do on the outside of my home? Regularly fix the edges of walkways and driveways and fix any cracks. Remove anything that might make you trip as you walk through a door, such as a raised step or threshold. Trim any bushes or trees on the path to your home. Use bright outdoor lighting. Clear any walking paths of anything that might make someone trip, such as rocks or tools. Regularly check to see if handrails are loose or broken. Make sure that both sides of any steps have handrails. Any raised decks and porches should have guardrails on the edges. Have any leaves, snow, or ice cleared regularly. Use sand or salt on walking paths during winter. Clean up any spills in your garage right away. This includes oil or grease spills. What can I do in the bathroom? Use night lights. Install grab bars by the toilet and in the tub and shower. Do not use towel bars as grab bars. Use non-skid mats or decals in the tub or shower. If you need to sit down in the shower, use a plastic, non-slip stool. Keep the floor dry. Clean up any water that spills on the floor as soon as it happens. Remove soap buildup in  the tub or shower regularly. Attach bath mats securely with double-sided non-slip rug tape. Do not have throw rugs and other things on the floor that can make you trip. What can I do in the bedroom? Use night lights. Make sure that you have a light by your bed that is easy to reach. Do not use any sheets or blankets that are too big for your bed. They should not hang down onto the floor. Have a firm chair that has side arms. You can use this for support while you get dressed. Do not have throw rugs and other things on the floor that can make you trip. What can I do in the kitchen? Clean up any spills right away. Avoid walking on wet floors. Keep items that you use a lot in easy-to-reach places. If you need to reach something above you, use a strong step stool that has a grab bar. Keep electrical cords out of the way. Do not use floor polish or wax that makes floors slippery. If you must use wax, use non-skid floor wax. Do not have throw rugs and other things on the floor that can make you trip. What can I do with my stairs? Do  not leave any items on the stairs. Make sure that there are handrails on both sides of the stairs and use them. Fix handrails that are broken or loose. Make sure that handrails are as long as the stairways. Check any carpeting to make sure that it is firmly attached to the stairs. Fix any carpet that is loose or worn. Avoid having throw rugs at the top or bottom of the stairs. If you do have throw rugs, attach them to the floor with carpet tape. Make sure that you have a light switch at the top of the stairs and the bottom of the stairs. If you do not have them, ask someone to add them for you. What else can I do to help prevent falls? Wear shoes that: Do not have high heels. Have rubber bottoms. Are comfortable and fit you well. Are closed at the toe. Do not wear sandals. If you use a stepladder: Make sure that it is fully opened. Do not climb a closed  stepladder. Make sure that both sides of the stepladder are locked into place. Ask someone to hold it for you, if possible. Clearly mark and make sure that you can see: Any grab bars or handrails. First and last steps. Where the edge of each step is. Use tools that help you move around (mobility aids) if they are needed. These include: Canes. Walkers. Scooters. Crutches. Turn on the lights when you go into a dark area. Replace any light bulbs as soon as they burn out. Set up your furniture so you have a clear path. Avoid moving your furniture around. If any of your floors are uneven, fix them. If there are any pets around you, be aware of where they are. Review your medicines with your doctor. Some medicines can make you feel dizzy. This can increase your chance of falling. Ask your doctor what other things that you can do to help prevent falls. This information is not intended to replace advice given to you by your health care provider. Make sure you discuss any questions you have with your health care provider. Document Released: 02/24/2009 Document Revised: 10/06/2015 Document Reviewed: 06/04/2014 Elsevier Interactive Patient Education  2017 [employer].

## 2022-07-22 ENCOUNTER — Other Ambulatory Visit: Payer: Self-pay | Admitting: Student

## 2022-07-22 ENCOUNTER — Ambulatory Visit
Admission: RE | Admit: 2022-07-22 | Discharge: 2022-07-22 | Disposition: A | Discharge status: Home / Self Care | Payer: Medicare Other | Source: Ambulatory Visit | Attending: Student | Admitting: Student

## 2022-07-22 ENCOUNTER — Other Ambulatory Visit: Payer: Self-pay | Admitting: Internal Medicine

## 2022-07-22 DIAGNOSIS — Z8679 Personal history of other diseases of the circulatory system: Secondary | ICD-10-CM

## 2022-07-22 DIAGNOSIS — M79605 Pain in left leg: Secondary | ICD-10-CM | POA: Diagnosis not present

## 2022-07-22 DIAGNOSIS — R2240 Localized swelling, mass and lump, unspecified lower limb: Secondary | ICD-10-CM | POA: Diagnosis not present

## 2022-07-22 DIAGNOSIS — L03116 Cellulitis of left lower limb: Secondary | ICD-10-CM | POA: Diagnosis not present

## 2022-07-23 NOTE — Telephone Encounter (Signed)
Requested medication (s) are due for refill today: yes  Requested medication (s) are on the active medication list: yes  Last refill:  07/12/22  Future visit scheduled: yes  Notes to clinic:  Unable to refill per protocol, last refill by another provider. Historical provider, routing for review.     Requested Prescriptions  Pending Prescriptions Disp Refills   cyclobenzaprine (FLEXERIL) 5 MG tablet [Pharmacy Med Name: CYCLOBENZAPRINE 5 MG TABLET] 30 tablet 1    Sig: TAKE 1 TABLET BY MOUTH DAILY AS NEEDED FOR MUSCLE SPASMS.     Not Delegated - Analgesics:  Muscle Relaxants Failed - 07/22/2022  2:15 PM      Failed - This refill cannot be delegated      Passed - Valid encounter within last 6 months    Recent Outpatient Visits           2 months ago Encounter for completion of form with patient   [place], DO   3 months ago Hypertension, benign   [place], DO   7 months ago Hypertension, benign   [facility] Teodora Medici, DO   8 months ago Hx of spontaneous intraparenchymal intracranial hemorrhage   [facility] Teodora Medici, DO   10 months ago Hypertension, benign   [facility] Teodora Medici, DO       Future Appointments             In 4 weeks Teodora Medici, [facility], [place]   In 5 months Debroah Loop, [place]

## 2022-07-25 ENCOUNTER — Ambulatory Visit: Payer: Medicare Other

## 2022-07-26 ENCOUNTER — Other Ambulatory Visit: Payer: Self-pay | Admitting: Internal Medicine

## 2022-07-26 DIAGNOSIS — I10 Essential (primary) hypertension: Secondary | ICD-10-CM

## 2022-07-27 NOTE — Telephone Encounter (Signed)
Requested medication (s) are due for refill today: routing for review  Requested medication (s) are on the active medication list: no, historical medication  Last refill:  unknown  Future visit scheduled: yes  Notes to clinic:  Unable to refill per protocol, last refill by another provider. Historical medication, routing for review.     Requested Prescriptions  Pending Prescriptions Disp Refills   valsartan-hydrochlorothiazide (DIOVAN-HCT) 80-12.5 MG tablet [Pharmacy Med Name: VALSARTAN-HCTZ 80-12.5 MG TAB] 90 tablet     Sig: TAKE 1 TABLET BY MOUTH EVERY DAY     Cardiovascular: ARB + Diuretic Combos Failed - 07/26/2022  2:31 PM      Failed - Cr in normal range and within 180 days    Creat  Date Value Ref Range Status  02/27/2021 0.90 0.50 - 1.05 mg/dL Final   Creatinine, Ser  Date Value Ref Range Status  02/13/2022 1.03 (H) 0.44 - 1.00 mg/dL Final   Creatinine,U  Date Value Ref Range Status  04/15/2014 88.2 mg/dL Final         Passed - K in normal range and within 180 days    Potassium  Date Value Ref Range Status  02/13/2022 3.7 3.5 - 5.1 mmol/L Final  07/03/2012 3.7 3.5 - 5.1 mmol/L Final         Passed - Na in normal range and within 180 days    Sodium  Date Value Ref Range Status  02/13/2022 141 135 - 145 mmol/L Final  07/03/2012 141 136 - 145 mmol/L Final         Passed - eGFR is 10 or above and within 180 days    GFR, Est African American  Date Value Ref Range Status  01/13/2019 60 > OR = 60 mL/min/1.30m2 Final   GFR, Est Non African American  Date Value Ref Range Status  01/13/2019 52 (L) > OR = 60 mL/min/1.26m2 Final   GFR, Estimated  Date Value Ref Range Status  02/13/2022 58 (L) >60 mL/min Final    Comment:    (NOTE) Calculated using the CKD-EPI Creatinine Equation (2021)    GFR  Date Value Ref Range Status  07/05/2014 82.53 >60.00 mL/min Final   eGFR  Date Value Ref Range Status  02/27/2021 69 > OR = 60 mL/min/1.40m2 Final    Comment:     The eGFR is based on the CKD-EPI 2021 equation. To calculate  the new eGFR from a previous Creatinine or Cystatin C result, go to https://www.kidney.org/professionals/ kdoqi/gfr%5Fcalculator          Passed - Patient is not pregnant      Passed - Last BP in normal range    BP Readings from Last 1 Encounters:  07/19/22 110/62         Passed - Valid encounter within last 6 months    Recent Outpatient Visits           2 months ago Encounter for completion of form with patient   [place], DO   3 months ago Hypertension, benign   [place], DO   7 months ago Hypertension, benign   [facility] Teodora Medici, DO   8 months ago Hx of spontaneous intraparenchymal intracranial hemorrhage   [facility] Teodora Medici, DO   10 months ago Hypertension, benign   [facility] Teodora Medici, DO       Future Appointments  In 3 weeks Teodora Medici, [facility], [place]   In 5 months Debroah Loop, [place]

## 2022-07-30 DIAGNOSIS — R2240 Localized swelling, mass and lump, unspecified lower limb: Secondary | ICD-10-CM | POA: Diagnosis not present

## 2022-07-30 DIAGNOSIS — L03116 Cellulitis of left lower limb: Secondary | ICD-10-CM | POA: Diagnosis not present

## 2022-08-01 ENCOUNTER — Ambulatory Visit: Payer: Medicare Other

## 2022-08-03 DIAGNOSIS — R2240 Localized swelling, mass and lump, unspecified lower limb: Secondary | ICD-10-CM | POA: Diagnosis not present

## 2022-08-03 DIAGNOSIS — L03116 Cellulitis of left lower limb: Secondary | ICD-10-CM | POA: Diagnosis not present

## 2022-08-20 NOTE — Progress Notes (Unsigned)
Established Patient Office Visit  Subjective:  Patient ID: Terri Werner, female    DOB: 1950-05-20  Age: 72 y.o. MRN: [id number]  CC:  No chief complaint on file.   HPI Terri Werner presents for follow up on follow up.   Hypertension: -Medications: Amlodipine 5 mg, Valsartan-HCTZ 80-12.5 mg daily  -Patient is compliant with above medications and reports no side effects. -Checking BP at home (average): 120-130/70-80 -Denies any SOB, CP, vision changes, LE edema or symptoms of hypotension.   HLD/HX of CVA: -Medications: Crestor discontinued after SATURN trial (no statins until 06/2013) but on Flexeril 5 mg PRN -Patient just finished with PT and OT outpatient. Ambulating well with cane. Doing home exercises. Receiving botox injections in her right lower extremity which have been helping somewhat.  -Last lipid panel: Lipid Panel     Component Value Date/Time   CHOL 158 06/01/2021 0510   TRIG 60 06/01/2021 0510   HDL 50 06/01/2021 0510   CHOLHDL 3.2 06/01/2021 0510   VLDL 12 06/01/2021 0510   LDLCALC 96 06/01/2021 0510   LDLCALC 101 (H) 02/27/2021 1040   Chronic Constipation: -Currently taking Senna PRN -Having one BM about every 3 days, doing well no changes.  Abnormal Urinary Flow: -Following with Urology, note from 11/01/21 reviewed. Cystoscopy, cystogram and CT A/P negative for signs of fistula. Patient states she only had the gas in the urine symptoms once or twice since testing.   Health Maintenance: -Blood work up to date -Mammogram 7/23 Birads-1  -Colon cancer screening: Colonoscopy 1/23  Past Medical History:  Diagnosis Date   Arthritis    pt reports in back & knees   Colon polyps 2010   At [facility]   Diverticulitis 2016   Genital warts    GERD (gastroesophageal reflux disease)    Heart murmur    Hepatitis B    History of blood transfusion    during each major surgery per pt   History of chicken pox    History of hiatal hernia     History of torn meniscus of right knee    HTN (hypertension)    Hx of migraine headaches    Hypertension    Hypopotassemia    Pre-diabetes    Stroke (HCC) 05/27/2021    Past Surgical History:  Procedure Laterality Date   ABDOMINAL HYSTERECTOMY  2006   APPENDECTOMY  1980   CESAREAN SECTION  1981   COLONOSCOPY WITH PROPOFOL N/A 11/01/2014   Procedure: COLONOSCOPY WITH PROPOFOL;  Surgeon: Scot Jun, MD;  Location: [facility] ENDOSCOPY;  Service: Endoscopy;  Laterality: N/A;   COLONOSCOPY WITH PROPOFOL N/A 01/05/2020   Procedure: COLONOSCOPY WITH PROPOFOL;  Surgeon: Regis Bill, MD;  Location: [facility] ENDOSCOPY;  Service: Endoscopy;  Laterality: N/A;   CYSTOSCOPY WITH BIOPSY N/A 02/19/2022   Procedure: CYSTOSCOPY WITH BLADDER BIOPSY;  Surgeon: Vanna Scotland, MD;  Location: [facility] ORS;  Service: Urology;  Laterality: N/A;   ECTOPIC PREGNANCY SURGERY  1980   HERNIA REPAIR  2006   Umbilical   KNEE ARTHROSCOPY Left 2006   torn meniscus   TONSILLECTOMY  1971   TOTAL ABDOMINAL HYSTERECTOMY W/ BILATERAL SALPINGOOPHORECTOMY  2006   VESICOVAGINAL FISTULA CLOSURE W/ TAH      Family History  Problem Relation Age of Onset   Breast cancer Sister 45   Hypertension Mother    Alzheimer's disease Mother    Dementia Mother    Hypertension Maternal Grandfather    Hyperlipidemia Maternal Grandfather  Birth defects Maternal Grandmother        Bladder   Colon cancer Maternal Grandmother    Congestive Heart Failure Father    Hyperlipidemia Father    Dementia Paternal Grandmother    Dementia Paternal Grandfather    Thyroid disease Sister     Social History   Socioeconomic History   Marital status: Widowed    Spouse name: Not on file   Number of children: 2   Years of education: Not on file   Highest education level: Master's degree (e.g., MA, MS, MEng, MEd, MSW, MBA)  Occupational History   Occupation: [employer] - [place]    Comment: Full Time    Employer: [employer]  Tobacco Use   Smoking status: Never   Smokeless tobacco: Never  Vaping Use   Vaping Use: Never used  Substance and Sexual Activity   Alcohol use: Not Currently    Comment: rare   Drug use: No   Sexual activity: Not Currently    Partners: Male  Other Topics Concern   Not on file  Social History Narrative   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      Regular Exercise -  NO   Daily Caffeine Use:  1 soda/tea          Social Determinants of Health   Financial Resource Strain: Medium Risk (07/19/2022)   Overall Financial Resource Strain (CARDIA)    Difficulty of Paying Living Expenses: Somewhat hard  Food Insecurity: No Food Insecurity (07/19/2022)   Hunger Vital Sign    Worried About Running Out of Food in the Last Year: Never true    Ran Out of Food in the Last Year: Never true  Transportation Needs: No Transportation Needs (07/19/2022)   PRAPARE - Administrator, Civil Service (Medical): No    Lack of Transportation (Non-Medical): No  Physical Activity: Unknown (07/19/2022)   Exercise Vital Sign    Days of Exercise per Week: 3 days    Minutes of Exercise per Session: Not on file  Stress: No Stress Concern Present (07/19/2022)   [employer] of Occupational Health - Occupational Stress Questionnaire    Feeling of Stress : Not at all  Social Connections: Moderately Integrated (07/19/2022)   Social Connection and Isolation Panel [NHANES]    Frequency of Communication with Friends and Family: More than three times a week    Frequency of Social Gatherings with Friends and Family: More than three times a week    Attends Religious Services: More than 4 times per year    Active Member of [employer] or Organizations: Yes    Attends Banker Meetings: More than 4 times per year    Marital Status: Widowed  Intimate Partner Violence: Not At Risk (07/19/2022)    Humiliation, Afraid, Rape, and Kick questionnaire    Fear of Current or Ex-Partner: No    Emotionally Abused: No    Physically Abused: No    Sexually Abused: No    Outpatient Medications Prior to Visit  Medication Sig Dispense Refill   acetaminophen (TYLENOL) 325 MG tablet Take 2 tablets (650 mg total) by mouth every 4 (four) hours as needed for mild pain (or temp > 37.5 C (99.5 F)).     acidophilus (RISAQUAD) CAPS capsule Take 1 capsule by mouth daily. 30 capsule 0   amLODipine (NORVASC) 5 MG  tablet 1 tablet Orally Once a day for 30 day(s)     Cholecalciferol (VITAMIN D) 50 MCG (2000 UT) tablet Take 1 tablet (2,000 Units total) by mouth daily. 90 tablet 3   cyclobenzaprine (FLEXERIL) 5 MG tablet 1 tablet at bedtime as needed Orally Once a day     diclofenac Sodium (VOLTAREN) 1 % GEL Apply 4 g topically 4 (four) times daily. 400 g 0   gabapentin (NEURONTIN) 300 MG capsule 1 capsule Orally Once a day     lidocaine (XYLOCAINE) 5 % ointment Apply 1 Application topically as needed. 35.44 g 2   polyethylene glycol powder (GLYCOLAX/MIRALAX) 17 GM/SCOOP powder Take 17 g by mouth daily as needed for moderate constipation. 3350 g 1   senna-docusate (SENOKOT-S) 8.6-50 MG tablet Take 1 tablet by mouth 2 (two) times daily. 90 tablet 3   valsartan-hydrochlorothiazide (DIOVAN-HCT) 80-12.5 MG tablet TAKE 1 TABLET BY MOUTH EVERY DAY 90 tablet 0   Vitamin D, Ergocalciferol, 50000 units CAPS 1 capsule Orally for 30 day(s)     No facility-administered medications prior to visit.    Allergies  Allergen Reactions   Latex Itching, Rash and Anaphylaxis    Burning   Shellfish-Derived Products Anaphylaxis   Shrimp [Shellfish Allergy] Swelling    Lips will burn     ROS Review of Systems  Constitutional:  Negative for chills and fever.  Eyes:  Negative for visual disturbance.  Respiratory:  Negative for shortness of breath.   Cardiovascular:  Negative for chest pain and palpitations.  Genitourinary:   Negative for dysuria.  Neurological:  Negative for dizziness and headaches.      Objective:    Physical Exam Constitutional:      Appearance: Normal appearance.     Comments: Walking with cane  HENT:     Head: Normocephalic and atraumatic.  Eyes:     Conjunctiva/sclera: Conjunctivae normal.  Cardiovascular:     Rate and Rhythm: Normal rate and regular rhythm.  Pulmonary:     Effort: Pulmonary effort is normal.     Breath sounds: Normal breath sounds.  Musculoskeletal:     Right lower leg: No edema.     Left lower leg: No edema.  Skin:    General: Skin is warm and dry.  Neurological:     Mental Status: She is alert. Mental status is at baseline.  Psychiatric:        Mood and Affect: Mood normal.        Behavior: Behavior normal.    There were no vitals taken for this visit. Wt Readings from Last 3 Encounters:  07/19/22 217 lb 14.4 oz (98.8 kg)  07/13/22 216 lb (98 kg)  05/25/22 213 lb (96.6 kg)   There were no vitals filed for this visit.     Health Maintenance Due  Topic Date Due   COVID-19 Vaccine (7 - 2023-24 season) 01/12/2022    There are no preventive care reminders to display for this patient.  Lab Results  Component Value Date   TSH 1.80 02/27/2021   Lab Results  Component Value Date   WBC 5.5 02/13/2022   HGB 11.9 (L) 02/13/2022   HCT 36.8 02/13/2022   MCV 88.9 02/13/2022   PLT 204 02/13/2022   Lab Results  Component Value Date   NA 141 02/13/2022   K 3.7 02/13/2022   CO2 29 02/13/2022   GLUCOSE 109 (H) 02/13/2022   BUN 17 02/13/2022   CREATININE 1.03 (H) 02/13/2022   BILITOT 0.5  06/01/2021   ALKPHOS 40 06/01/2021   AST 16 06/01/2021   ALT 13 06/01/2021   PROT 6.7 06/01/2021   ALBUMIN 3.2 (L) 06/01/2021   CALCIUM 9.0 02/13/2022   ANIONGAP 7 02/13/2022   EGFR 69 02/27/2021   GFR 82.53 07/05/2014   Lab Results  Component Value Date   CHOL 158 06/01/2021   Lab Results  Component Value Date   HDL 50 06/01/2021   Lab Results   Component Value Date   LDLCALC 96 06/01/2021   Lab Results  Component Value Date   TRIG 60 06/01/2021   Lab Results  Component Value Date   CHOLHDL 3.2 06/01/2021   Lab Results  Component Value Date   HGBA1C 5.9 (A) 02/27/2021      Assessment & Plan:   1. Hypertension, benign: Chronic and stable. Continue same medications Amlodipine 5 mg, Valsartan-HCTZ 80-12.5 mg daily. Refilled today.   - amLODipine (NORVASC) 5 MG tablet; Take 1 tablet (5 mg total) by mouth daily.  Dispense: 90 tablet; Refill: 0 - valsartan-hydrochlorothiazide (DIOVAN HCT) 80-12.5 MG tablet; Take 1 tablet by mouth daily.  Dispense: 90 tablet; Refill: 0  2. Hx of spontaneous intraparenchymal intracranial hemorrhage/Pure hypercholesterolemia: Patient is doing very well, she just finished with PT /OT and is doing home exercises. She is taking a muscle relaxer as needed but is not on a statin after the SATURN trial. She will due for annual labs at follow up.    Follow-up: No follow-ups on file.    Margarita Mail, DO

## 2022-08-21 ENCOUNTER — Encounter: Payer: Self-pay | Admitting: Internal Medicine

## 2022-08-21 ENCOUNTER — Ambulatory Visit (INDEPENDENT_AMBULATORY_CARE_PROVIDER_SITE_OTHER): Payer: Medicare Other | Admitting: Internal Medicine

## 2022-08-21 VITALS — BP 124/72 | HR 83 | Temp 97.9°F | Resp 16 | Ht 62.0 in | Wt 215.1 lb

## 2022-08-21 DIAGNOSIS — R7303 Prediabetes: Secondary | ICD-10-CM

## 2022-08-21 DIAGNOSIS — E559 Vitamin D deficiency, unspecified: Secondary | ICD-10-CM | POA: Diagnosis not present

## 2022-08-21 DIAGNOSIS — E7800 Pure hypercholesterolemia, unspecified: Secondary | ICD-10-CM | POA: Diagnosis not present

## 2022-08-21 DIAGNOSIS — Z23 Encounter for immunization: Secondary | ICD-10-CM | POA: Diagnosis not present

## 2022-08-21 DIAGNOSIS — I10 Essential (primary) hypertension: Secondary | ICD-10-CM

## 2022-08-21 MED ORDER — VALSARTAN-HYDROCHLOROTHIAZIDE 80-12.5 MG PO TABS: 1.0000 | ORAL_TABLET | Freq: Every day | ORAL | 1 refills | Status: DC

## 2022-08-21 MED ORDER — AMLODIPINE BESYLATE 5 MG PO TABS: 5.0000 mg | ORAL_TABLET | Freq: Every day | ORAL | 1 refills | Status: DC

## 2022-08-21 NOTE — Patient Instructions (Signed)
It was great seeing you today!  Plan discussed at today's visit: -Blood work ordered today, results will be uploaded to MyChart.  -Prevnar 20 administered today  Follow up in: 6 months   Take care and let us know if you have any questions or concerns prior to your next visit.  Dr. Caralee Ates

## 2022-08-22 LAB — COMPLETE METABOLIC PANEL WITH GFR
AG Ratio: 1.4 (calc) (ref 1.0–2.5)
ALT: 14 U/L (ref 6–29)
AST: 19 U/L (ref 10–35)
Albumin: 4.3 g/dL (ref 3.6–5.1)
Alkaline phosphatase (APISO): 47 U/L (ref 37–153)
BUN: 14 mg/dL (ref 7–25)
CO2: 30 mmol/L (ref 20–32)
Calcium: 9.5 mg/dL (ref 8.6–10.4)
Chloride: 103 mmol/L (ref 98–110)
Creat: 0.85 mg/dL (ref 0.60–1.00)
Globulin: 3.1 g/dL (calc) (ref 1.9–3.7)
Glucose, Bld: 98 mg/dL (ref 65–99)
Potassium: 4.3 mmol/L (ref 3.5–5.3)
Sodium: 141 mmol/L (ref 135–146)
Total Bilirubin: 0.3 mg/dL (ref 0.2–1.2)
Total Protein: 7.4 g/dL (ref 6.1–8.1)
eGFR: 74 mL/min/{1.73_m2} (ref >=60)

## 2022-08-22 LAB — CBC WITH DIFFERENTIAL/PLATELET
Absolute Monocytes: 445 cells/uL (ref 200–950)
Basophils Absolute: 10 cells/uL (ref 0–200)
Basophils Relative: 0.2 %
Eosinophils Absolute: 100 cells/uL (ref 15–500)
Eosinophils Relative: 2 %
HCT: 35.8 % (ref 35.0–45.0)
Hemoglobin: 12 g/dL (ref 11.7–15.5)
Lymphs Abs: 1680 cells/uL (ref 850–3900)
MCH: 28.7 pg (ref 27.0–33.0)
MCHC: 33.5 g/dL (ref 32.0–36.0)
MCV: 85.6 fL (ref 80.0–100.0)
MPV: 11.3 fL (ref 7.5–12.5)
Monocytes Relative: 8.9 %
Neutro Abs: 2765 cells/uL (ref 1500–7800)
Neutrophils Relative %: 55.3 %
Platelets: 199 10*3/uL (ref 140–400)
RBC: 4.18 10*6/uL (ref 3.80–5.10)
RDW: 13.9 % (ref 11.0–15.0)
Total Lymphocyte: 33.6 %
WBC: 5 10*3/uL (ref 3.8–10.8)

## 2022-08-22 LAB — VITAMIN D 25 HYDROXY (VIT D DEFICIENCY, FRACTURES): Vit D, 25-Hydroxy: 36 ng/mL (ref 30–100)

## 2022-08-22 LAB — MAGNESIUM: Magnesium: 2.3 mg/dL (ref 1.5–2.5)

## 2022-08-22 LAB — LIPID PANEL
Cholesterol: 168 mg/dL (ref <200)
HDL: 48 mg/dL — ABNORMAL LOW (ref >=50)
LDL Cholesterol (Calc): 106 mg/dL (calc) — ABNORMAL HIGH
Non-HDL Cholesterol (Calc): 120 mg/dL (calc) (ref <130)
Total CHOL/HDL Ratio: 3.5 (calc) (ref <5.0)
Triglycerides: 55 mg/dL (ref <150)

## 2022-08-22 LAB — HEMOGLOBIN A1C
Hgb A1c MFr Bld: 6.3 % of total Hgb — ABNORMAL HIGH (ref <5.7)
Mean Plasma Glucose: 134 mg/dL
eAG (mmol/L): 7.4 mmol/L

## 2022-08-24 ENCOUNTER — Encounter: Payer: Self-pay | Admitting: Physical Medicine & Rehabilitation

## 2022-08-24 ENCOUNTER — Encounter: Payer: Medicare Other | Attending: Registered Nurse | Admitting: Physical Medicine & Rehabilitation

## 2022-08-24 VITALS — Ht 62.0 in | Wt 216.0 lb

## 2022-08-24 DIAGNOSIS — M7061 Trochanteric bursitis, right hip: Secondary | ICD-10-CM | POA: Diagnosis not present

## 2022-08-24 DIAGNOSIS — G8111 Spastic hemiplegia affecting right dominant side: Secondary | ICD-10-CM | POA: Diagnosis not present

## 2022-08-24 NOTE — Progress Notes (Signed)
Subjective:    Patient ID: Terri Werner, female    DOB: Sep 13, 1950, 72 y.o.   MRN: [id number]  HPI  72 yo female with Right spastic hemiparesis only partially responsive to PT/OT who returns to clinic  after Botox injection performed ~6wks ago  Also complains of chronic pain since year she saw [facility] ordered MRI.  Results were not available to me.  Patient states that she has had hip pain on the right side.  She did get a short-term relief with the right troches bursa injection performed on 05/25/2022 at this office. 07/13/22 BOTOX  50 units/mL RIGHT FHL 75  FDL 75  PT 75  Med Gastr 50  Lat Gastr 50 Med soleus 50 FHB25  Foot feels better since injection , walking is better  Pain Inventory Average Pain 3 Pain Right Now 3 My pain is dull  LOCATION OF PAIN  HIP, ANKLE, TOES  BOWEL Number of stools per week: 2 Oral laxative use Yes  Type of laxative OTC stool softener & Miralax Enema or suppository use No    BLADDERNormal     Mobility how many minutes can you walk? Maybe 30 mins or more ability to climb steps?  yes do you drive?  yes Do you have any goals in this area?  yes  Function retired Do you have any goals in this area?  yes  Neuro/Psych weakness tingling trouble walking spasms  Prior Studies [place] of left leg- after leg injury in March 2024  Physicians involved in your care Any changes since last visit?  no   Family History  Problem Relation Age of Onset   Breast cancer Sister 42   Hypertension Mother    Alzheimer's disease Mother    Dementia Mother    Hypertension Maternal Grandfather    Hyperlipidemia Maternal Grandfather    Birth defects Maternal Grandmother        Bladder   Colon cancer Maternal Grandmother    Congestive Heart Failure Father    Hyperlipidemia Father    Dementia Paternal Grandmother    Dementia Paternal Grandfather    Thyroid disease Sister    Social History   Socioeconomic History    Marital status: Widowed    Spouse name: Not on file   Number of children: 2   Years of education: Not on file   Highest education level: Master's degree (e.g., MA, MS, MEng, MEd, MSW, MBA)  Occupational History   Occupation: H[employer] - [place]    Comment: Full Time    Employer: [employer]  Tobacco Use   Smoking status: Never   Smokeless tobacco: Never  Vaping Use   Vaping Use: Never used  Substance and Sexual Activity   Alcohol use: Not Currently    Comment: rare   Drug use: No   Sexual activity: Not Currently    Partners: Male  Other Topics Concern   Not on file  Social History Narrative   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      Regular Exercise -  NO   Daily Caffeine Use:  1 soda/tea          Social Determinants of Health   Financial Resource Strain: Medium Risk (08/20/2022)   Overall Financial Resource Strain (CARDIA)    Difficulty of Paying Living Expenses: Somewhat hard  Food Insecurity: No Food Insecurity (08/20/2022)  Hunger Vital Sign    Worried About Running Out of Food in the Last Year: Never true    Ran Out of Food in the Last Year: Never true  Transportation Needs: No Transportation Needs (08/20/2022)   PRAPARE - Administrator, Civil Service (Medical): No    Lack of Transportation (Non-Medical): No  Physical Activity: Sufficiently Active (08/20/2022)   Exercise Vital Sign    Days of Exercise per Week: 3 days    Minutes of Exercise per Session: 50 min  Stress: No Stress Concern Present (08/20/2022)   [employer] of Occupational Health - Occupational Stress Questionnaire    Feeling of Stress : Only a little  Social Connections: Moderately Integrated (08/20/2022)   Social Connection and Isolation Panel [NHANES]    Frequency of Communication with Friends and Family: More than three times a week    Frequency of  Social Gatherings with Friends and Family: More than three times a week    Attends Religious Services: More than 4 times per year    Active Member of [employer] or Organizations: Yes    Attends Banker Meetings: More than 4 times per year    Marital Status: Widowed   Past Surgical History:  Procedure Laterality Date   ABDOMINAL HYSTERECTOMY  2006   APPENDECTOMY  1980   CESAREAN SECTION  1981   COLONOSCOPY WITH PROPOFOL N/A 11/01/2014   Procedure: COLONOSCOPY WITH PROPOFOL;  Surgeon: Scot Jun, MD;  Location: [facility] ENDOSCOPY;  Service: Endoscopy;  Laterality: N/A;   COLONOSCOPY WITH PROPOFOL N/A 01/05/2020   Procedure: COLONOSCOPY WITH PROPOFOL;  Surgeon: Regis Bill, MD;  Location: [facility] ENDOSCOPY;  Service: Endoscopy;  Laterality: N/A;   CYSTOSCOPY WITH BIOPSY N/A 02/19/2022   Procedure: CYSTOSCOPY WITH BLADDER BIOPSY;  Surgeon: Vanna Scotland, MD;  Location: [facility] ORS;  Service: Urology;  Laterality: N/A;   ECTOPIC PREGNANCY SURGERY  1980   HERNIA REPAIR  2006   Umbilical   KNEE ARTHROSCOPY Left 2006   torn meniscus   TONSILLECTOMY  1971   TOTAL ABDOMINAL HYSTERECTOMY W/ BILATERAL SALPINGOOPHORECTOMY  2006   VESICOVAGINAL FISTULA CLOSURE W/ TAH     Past Medical History:  Diagnosis Date   Arthritis    pt reports in back & knees   Colon polyps 2010   At [facility]   Diverticulitis 2016   Genital warts    GERD (gastroesophageal reflux disease)    Heart murmur    Hepatitis B    History of blood transfusion    during each major surgery per pt   History of chicken pox    History of hiatal hernia    History of torn meniscus of right knee    HTN (hypertension)    Hx of migraine headaches    Hypertension    Hypopotassemia    Pre-diabetes    Stroke 05/27/2021   Ht 5\' 2"  (1.575 m)   Wt 216 lb (98 kg)   BMI 39.51 kg/m   Opioid Risk Score:   Fall Risk Score:  `1  Depression screen [facility] 2/9     08/24/2022   10:08 AM 08/21/2022    8:42 AM 07/19/2022    1:15 PM  07/13/2022    9:57 AM 05/25/2022   12:03 PM 05/22/2022    8:27 AM 04/13/2022   11:13 AM  Depression screen [facility] 2/9  Decreased Interest 0 0 0 0 0 0 0  Down, Depressed, Hopeless 0 0 0 0 0 0 0  PHQ - 2 Score 0 0 0 0 0 0 0  Altered sleeping  0       Tired, decreased energy  0       Change in appetite  0       Feeling bad or failure about yourself   0       Trouble concentrating  0       Moving slowly or fidgety/restless  0       Suicidal thoughts  0       PHQ-9 Score  0       Difficult doing work/chores  Not difficult at all         Review of Systems  Musculoskeletal:  Positive for back pain and gait problem.       Right hip pain, right leg pain   All other systems reviewed and are negative.      Objective:   Physical Exam  Motor strength is 5/5 in the right knee extensors 4 and a right hip flexors 0 on the right ankle dorsiflexor Tone MAS 3/4 in the ankle plantar flexors ankle inverters 2 2 toe flexors 2 Ambulates without AFO with a standard cane. Positive toe drag with compensatory hip hiking and circumduction. Mild tenderness palpation of the right troches bursa.      Assessment & Plan:  #1.  Right spastic hemiparesis mainly affecting lower extremity has had good relief of spasticity with botulinum toxin injection with continue with current dose and muscle group selection BOTOX  50 units/mL RIGHT FHL 75  FDL 75  PT 75  Med Gastr 50  Lat Gastr 50 Med soleus 50 FHB25  #2.  Right hip trochanteric bursa related pain she has compensatory hip circumduction which puts her at risk for recurrence.  She did get short-term relief with corticosteroid injection however as discussed with the patient underlying gait abnormality is ankle dorsiflexion weakness.  She would benefit from right AFO.  She states she has 2 different types of AFO but does not think that it helps with her walking.  We discussed that she may need some additional training with physical therapy but does not want to  pursue this at the current time.  Records from [facility]

## 2022-08-29 ENCOUNTER — Encounter: Payer: Self-pay | Admitting: Internal Medicine

## 2022-09-11 DIAGNOSIS — M9901 Segmental and somatic dysfunction of cervical region: Secondary | ICD-10-CM | POA: Diagnosis not present

## 2022-09-11 DIAGNOSIS — M9906 Segmental and somatic dysfunction of lower extremity: Secondary | ICD-10-CM | POA: Diagnosis not present

## 2022-09-11 DIAGNOSIS — M9902 Segmental and somatic dysfunction of thoracic region: Secondary | ICD-10-CM | POA: Diagnosis not present

## 2022-09-11 DIAGNOSIS — M9903 Segmental and somatic dysfunction of lumbar region: Secondary | ICD-10-CM | POA: Diagnosis not present

## 2022-09-12 ENCOUNTER — Ambulatory Visit: Payer: Medicare Other | Admitting: Physician Assistant

## 2022-09-12 DIAGNOSIS — M9901 Segmental and somatic dysfunction of cervical region: Secondary | ICD-10-CM | POA: Diagnosis not present

## 2022-09-12 DIAGNOSIS — M9906 Segmental and somatic dysfunction of lower extremity: Secondary | ICD-10-CM | POA: Diagnosis not present

## 2022-09-12 DIAGNOSIS — M9903 Segmental and somatic dysfunction of lumbar region: Secondary | ICD-10-CM | POA: Diagnosis not present

## 2022-09-12 DIAGNOSIS — M9902 Segmental and somatic dysfunction of thoracic region: Secondary | ICD-10-CM | POA: Diagnosis not present

## 2022-09-17 DIAGNOSIS — M9906 Segmental and somatic dysfunction of lower extremity: Secondary | ICD-10-CM | POA: Diagnosis not present

## 2022-09-17 DIAGNOSIS — M9902 Segmental and somatic dysfunction of thoracic region: Secondary | ICD-10-CM | POA: Diagnosis not present

## 2022-09-17 DIAGNOSIS — M9903 Segmental and somatic dysfunction of lumbar region: Secondary | ICD-10-CM | POA: Diagnosis not present

## 2022-09-17 DIAGNOSIS — M9901 Segmental and somatic dysfunction of cervical region: Secondary | ICD-10-CM | POA: Diagnosis not present

## 2022-09-18 DIAGNOSIS — M9901 Segmental and somatic dysfunction of cervical region: Secondary | ICD-10-CM | POA: Diagnosis not present

## 2022-09-18 DIAGNOSIS — M9902 Segmental and somatic dysfunction of thoracic region: Secondary | ICD-10-CM | POA: Diagnosis not present

## 2022-09-18 DIAGNOSIS — M9903 Segmental and somatic dysfunction of lumbar region: Secondary | ICD-10-CM | POA: Diagnosis not present

## 2022-09-18 DIAGNOSIS — M9906 Segmental and somatic dysfunction of lower extremity: Secondary | ICD-10-CM | POA: Diagnosis not present

## 2022-09-19 DIAGNOSIS — M9903 Segmental and somatic dysfunction of lumbar region: Secondary | ICD-10-CM | POA: Diagnosis not present

## 2022-09-19 DIAGNOSIS — M9901 Segmental and somatic dysfunction of cervical region: Secondary | ICD-10-CM | POA: Diagnosis not present

## 2022-09-19 DIAGNOSIS — M9906 Segmental and somatic dysfunction of lower extremity: Secondary | ICD-10-CM | POA: Diagnosis not present

## 2022-09-19 DIAGNOSIS — M9902 Segmental and somatic dysfunction of thoracic region: Secondary | ICD-10-CM | POA: Diagnosis not present

## 2022-09-24 DIAGNOSIS — M9902 Segmental and somatic dysfunction of thoracic region: Secondary | ICD-10-CM | POA: Diagnosis not present

## 2022-09-24 DIAGNOSIS — M9901 Segmental and somatic dysfunction of cervical region: Secondary | ICD-10-CM | POA: Diagnosis not present

## 2022-09-24 DIAGNOSIS — M9903 Segmental and somatic dysfunction of lumbar region: Secondary | ICD-10-CM | POA: Diagnosis not present

## 2022-09-24 DIAGNOSIS — M9906 Segmental and somatic dysfunction of lower extremity: Secondary | ICD-10-CM | POA: Diagnosis not present

## 2022-10-02 DIAGNOSIS — M9906 Segmental and somatic dysfunction of lower extremity: Secondary | ICD-10-CM | POA: Diagnosis not present

## 2022-10-02 DIAGNOSIS — M9901 Segmental and somatic dysfunction of cervical region: Secondary | ICD-10-CM | POA: Diagnosis not present

## 2022-10-02 DIAGNOSIS — M9902 Segmental and somatic dysfunction of thoracic region: Secondary | ICD-10-CM | POA: Diagnosis not present

## 2022-10-02 DIAGNOSIS — M9903 Segmental and somatic dysfunction of lumbar region: Secondary | ICD-10-CM | POA: Diagnosis not present

## 2022-10-03 DIAGNOSIS — M9902 Segmental and somatic dysfunction of thoracic region: Secondary | ICD-10-CM | POA: Diagnosis not present

## 2022-10-03 DIAGNOSIS — M9906 Segmental and somatic dysfunction of lower extremity: Secondary | ICD-10-CM | POA: Diagnosis not present

## 2022-10-03 DIAGNOSIS — M9903 Segmental and somatic dysfunction of lumbar region: Secondary | ICD-10-CM | POA: Diagnosis not present

## 2022-10-03 DIAGNOSIS — M9901 Segmental and somatic dysfunction of cervical region: Secondary | ICD-10-CM | POA: Diagnosis not present

## 2022-10-09 DIAGNOSIS — M9903 Segmental and somatic dysfunction of lumbar region: Secondary | ICD-10-CM | POA: Diagnosis not present

## 2022-10-09 DIAGNOSIS — M9902 Segmental and somatic dysfunction of thoracic region: Secondary | ICD-10-CM | POA: Diagnosis not present

## 2022-10-09 DIAGNOSIS — M9901 Segmental and somatic dysfunction of cervical region: Secondary | ICD-10-CM | POA: Diagnosis not present

## 2022-10-09 DIAGNOSIS — M9906 Segmental and somatic dysfunction of lower extremity: Secondary | ICD-10-CM | POA: Diagnosis not present

## 2022-10-10 DIAGNOSIS — M9902 Segmental and somatic dysfunction of thoracic region: Secondary | ICD-10-CM | POA: Diagnosis not present

## 2022-10-10 DIAGNOSIS — M9903 Segmental and somatic dysfunction of lumbar region: Secondary | ICD-10-CM | POA: Diagnosis not present

## 2022-10-10 DIAGNOSIS — M9906 Segmental and somatic dysfunction of lower extremity: Secondary | ICD-10-CM | POA: Diagnosis not present

## 2022-10-10 DIAGNOSIS — M9901 Segmental and somatic dysfunction of cervical region: Secondary | ICD-10-CM | POA: Diagnosis not present

## 2022-10-15 DIAGNOSIS — M9906 Segmental and somatic dysfunction of lower extremity: Secondary | ICD-10-CM | POA: Diagnosis not present

## 2022-10-15 DIAGNOSIS — M9902 Segmental and somatic dysfunction of thoracic region: Secondary | ICD-10-CM | POA: Diagnosis not present

## 2022-10-15 DIAGNOSIS — M9903 Segmental and somatic dysfunction of lumbar region: Secondary | ICD-10-CM | POA: Diagnosis not present

## 2022-10-15 DIAGNOSIS — M9901 Segmental and somatic dysfunction of cervical region: Secondary | ICD-10-CM | POA: Diagnosis not present

## 2022-10-16 ENCOUNTER — Encounter: Payer: Medicare Other | Admitting: Physical Medicine & Rehabilitation

## 2022-10-17 DIAGNOSIS — M9901 Segmental and somatic dysfunction of cervical region: Secondary | ICD-10-CM | POA: Diagnosis not present

## 2022-10-17 DIAGNOSIS — M9902 Segmental and somatic dysfunction of thoracic region: Secondary | ICD-10-CM | POA: Diagnosis not present

## 2022-10-17 DIAGNOSIS — M9906 Segmental and somatic dysfunction of lower extremity: Secondary | ICD-10-CM | POA: Diagnosis not present

## 2022-10-17 DIAGNOSIS — M9903 Segmental and somatic dysfunction of lumbar region: Secondary | ICD-10-CM | POA: Diagnosis not present

## 2022-10-22 DIAGNOSIS — M9903 Segmental and somatic dysfunction of lumbar region: Secondary | ICD-10-CM | POA: Diagnosis not present

## 2022-10-22 DIAGNOSIS — M9906 Segmental and somatic dysfunction of lower extremity: Secondary | ICD-10-CM | POA: Diagnosis not present

## 2022-10-22 DIAGNOSIS — M9902 Segmental and somatic dysfunction of thoracic region: Secondary | ICD-10-CM | POA: Diagnosis not present

## 2022-10-22 DIAGNOSIS — M9901 Segmental and somatic dysfunction of cervical region: Secondary | ICD-10-CM | POA: Diagnosis not present

## 2022-11-05 ENCOUNTER — Other Ambulatory Visit: Payer: Self-pay | Admitting: Internal Medicine

## 2022-11-05 DIAGNOSIS — Z1231 Encounter for screening mammogram for malignant neoplasm of breast: Secondary | ICD-10-CM

## 2022-12-02 ENCOUNTER — Other Ambulatory Visit: Payer: Self-pay | Admitting: Internal Medicine

## 2022-12-03 ENCOUNTER — Ambulatory Visit
Admission: RE | Admit: 2022-12-03 | Discharge: 2022-12-03 | Disposition: A | Discharge status: Home / Self Care | Payer: Medicare Other | Source: Ambulatory Visit | Attending: Internal Medicine | Admitting: Internal Medicine

## 2022-12-03 DIAGNOSIS — Z1231 Encounter for screening mammogram for malignant neoplasm of breast: Secondary | ICD-10-CM | POA: Diagnosis not present

## 2022-12-18 DIAGNOSIS — R208 Other disturbances of skin sensation: Secondary | ICD-10-CM | POA: Diagnosis not present

## 2022-12-18 DIAGNOSIS — D485 Neoplasm of uncertain behavior of skin: Secondary | ICD-10-CM | POA: Diagnosis not present

## 2022-12-18 DIAGNOSIS — B078 Other viral warts: Secondary | ICD-10-CM | POA: Diagnosis not present

## 2022-12-18 DIAGNOSIS — L538 Other specified erythematous conditions: Secondary | ICD-10-CM | POA: Diagnosis not present

## 2022-12-18 DIAGNOSIS — L578 Other skin changes due to chronic exposure to nonionizing radiation: Secondary | ICD-10-CM | POA: Diagnosis not present

## 2022-12-20 ENCOUNTER — Encounter: Payer: Medicare Other | Admitting: Physical Medicine & Rehabilitation

## 2023-01-03 ENCOUNTER — Other Ambulatory Visit: Payer: Self-pay | Admitting: Internal Medicine

## 2023-01-03 DIAGNOSIS — I10 Essential (primary) hypertension: Secondary | ICD-10-CM

## 2023-01-04 ENCOUNTER — Other Ambulatory Visit (HOSPITAL_COMMUNITY): Payer: Self-pay

## 2023-01-04 NOTE — Telephone Encounter (Signed)
Requested Prescriptions  Pending Prescriptions Disp Refills   amLODipine (NORVASC) 5 MG tablet [Pharmacy Med Name: AMLODIPINE BESYLATE 5 MG TAB] 90 tablet 0    Sig: TAKE 1 TABLET (5 MG TOTAL) BY MOUTH DAILY.     Cardiovascular: Calcium Channel Blockers 2 Passed - 01/03/2023 11:08 AM      Passed - Last BP in normal range    BP Readings from Last 1 Encounters:  08/21/22 124/72         Passed - Last Heart Rate in normal range    Pulse Readings from Last 1 Encounters:  08/21/22 83         Passed - Valid encounter within last 6 months    Recent Outpatient Visits           4 months ago Hypertension, benign   [facility] Margarita Mail, DO   7 months ago Encounter for completion of form with patient   [facility] Margarita Mail, DO   8 months ago Hypertension, benign   [facility] Margarita Mail, DO   1 year ago Hypertension, benign   [facility] Margarita Mail, DO   1 year ago Hx of spontaneous intraparenchymal intracranial hemorrhage   [facility] Margarita Mail, DO       Future Appointments             In 1 week Carman Ching, PA-C [facility] [place]   In 1 month Margarita Mail, DO [facility]

## 2023-01-16 ENCOUNTER — Ambulatory Visit: Payer: Medicare Other | Admitting: Physician Assistant

## 2023-01-16 ENCOUNTER — Encounter: Payer: Self-pay | Admitting: Physician Assistant

## 2023-01-16 VITALS — BP 128/83 | HR 79 | Wt 202.0 lb

## 2023-01-16 DIAGNOSIS — R3915 Urgency of urination: Secondary | ICD-10-CM

## 2023-01-16 DIAGNOSIS — N321 Vesicointestinal fistula: Secondary | ICD-10-CM

## 2023-01-16 DIAGNOSIS — R350 Frequency of micturition: Secondary | ICD-10-CM | POA: Diagnosis not present

## 2023-01-16 NOTE — Progress Notes (Unsigned)
01/16/2023 5:13 PM   Terri Werner 08-01-1950 [id number]  CC: Chief Complaint  Patient presents with   urinary issues   HPI: Terri Werner is a 72 y.o. female with PMH chronic colovesical fistula managed conservatively and urinary urgency who presents today for follow-up.   Today she reports no UTIs since her last office visit.  She reports stable voiding symptoms including daytime urgency and nocturia x 1-4.  She denies dysuria or gross hematuria.  She still has occasional pneumaturia, exacerbated when her constipation worsens.  She is not particularly bothered by her urinary urgency and she does not wish to pursue surgical management for her fistula.  In-office UA today positive for trace lysed blood and trace leukocytes; urine microscopy with 11-30 WBCs/HPF, 3-10 RBCs/HPF, many bacteria, and yeast.  PMH: Past Medical History:  Diagnosis Date   Arthritis    pt reports in back & knees   Colon polyps 2010   At [facility]   Diverticulitis 2016   Genital warts    GERD (gastroesophageal reflux disease)    Heart murmur    Hepatitis B    History of blood transfusion    during each major surgery per pt   History of chicken pox    History of hiatal hernia    History of torn meniscus of right knee    HTN (hypertension)    Hx of migraine headaches    Hypertension    Hypopotassemia    Pre-diabetes    Stroke (HCC) 05/27/2021    Surgical History: Past Surgical History:  Procedure Laterality Date   ABDOMINAL HYSTERECTOMY  2006   APPENDECTOMY  1980   CESAREAN SECTION  1981   COLONOSCOPY WITH PROPOFOL N/A 11/01/2014   Procedure: COLONOSCOPY WITH PROPOFOL;  Surgeon: Scot Jun, MD;  Location: [facility] ENDOSCOPY;  Service: Endoscopy;  Laterality: N/A;   COLONOSCOPY WITH PROPOFOL N/A 01/05/2020   Procedure: COLONOSCOPY WITH PROPOFOL;  Surgeon: Regis Bill, MD;  Location: [facility] ENDOSCOPY;  Service: Endoscopy;  Laterality: N/A;   CYSTOSCOPY WITH BIOPSY  N/A 02/19/2022   Procedure: CYSTOSCOPY WITH BLADDER BIOPSY;  Surgeon: Vanna Scotland, MD;  Location: [facility] ORS;  Service: Urology;  Laterality: N/A;   ECTOPIC PREGNANCY SURGERY  1980   HERNIA REPAIR  2006   Umbilical   KNEE ARTHROSCOPY Left 2006   torn meniscus   TONSILLECTOMY  1971   TOTAL ABDOMINAL HYSTERECTOMY W/ BILATERAL SALPINGOOPHORECTOMY  2006   VESICOVAGINAL FISTULA CLOSURE W/ TAH      Home Medications:  Allergies as of 01/16/2023       Reactions   Latex Itching, Rash, Anaphylaxis   Burning   Shellfish-derived Products Anaphylaxis   Shrimp [shellfish Allergy] Swelling   Lips will burn        Medication List        Accurate as of January 16, 2023  5:13 PM. If you have any questions, ask your nurse or doctor.          STOP taking these medications    Vitamin D (Ergocalciferol) 50000 units Caps Stopped by: Carman Ching   Vitamin D 50 MCG (2000 UT) tablet Stopped by: Carman Ching       TAKE these medications    acetaminophen 325 MG tablet Commonly known as: TYLENOL Take 2 tablets (650 mg total) by mouth every 4 (four) hours as needed for mild pain (or temp > 37.5 C (99.5 F)).   acidophilus Caps capsule Take 1 capsule by mouth daily.  amLODipine 5 MG tablet Commonly known as: NORVASC TAKE 1 TABLET (5 MG TOTAL) BY MOUTH DAILY.   cyclobenzaprine 5 MG tablet Commonly known as: FLEXERIL 1 tablet at bedtime as needed Orally Once a day   diclofenac Sodium 1 % Gel Commonly known as: VOLTAREN Apply 4 g topically 4 (four) times daily.   lidocaine 5 % ointment Commonly known as: XYLOCAINE Apply 1 Application topically as needed.   polyethylene glycol powder 17 GM/SCOOP powder Commonly known as: GLYCOLAX/MIRALAX Take 17 g by mouth daily as needed for moderate constipation.   senna-docusate 8.6-50 MG tablet Commonly known as: Senokot-S Take 1 tablet by mouth 2 (two) times daily.   valsartan-hydrochlorothiazide 80-12.5 MG  tablet Commonly known as: DIOVAN-HCT Take 1 tablet by mouth daily.        Allergies:  Allergies  Allergen Reactions   Latex Itching, Rash and Anaphylaxis    Burning   Shellfish-Derived Products Anaphylaxis   Shrimp [Shellfish Allergy] Swelling    Lips will burn     Family History: Family History  Problem Relation Age of Onset   Breast cancer Sister 37   Hypertension Mother    Alzheimer's disease Mother    Dementia Mother    Hypertension Maternal Grandfather    Hyperlipidemia Maternal Grandfather    Birth defects Maternal Grandmother        Bladder   Colon cancer Maternal Grandmother    Congestive Heart Failure Father    Hyperlipidemia Father    Dementia Paternal Grandmother    Dementia Paternal Grandfather    Thyroid disease Sister     Social History:   reports that she has never smoked. She has never used smokeless tobacco. She reports that she does not currently use alcohol. She reports that she does not use drugs.  Physical Exam: BP 128/83   Pulse 79   Wt 202 lb (91.6 kg)   BMI 36.95 kg/m   Constitutional:  Alert and oriented, no acute distress, nontoxic appearing HEENT: [place], AT Cardiovascular: No clubbing, cyanosis, or edema Respiratory: Normal respiratory effort, no increased work of breathing Skin: No rashes, bruises or suspicious lesions Neurologic: Grossly intact, no focal deficits, moving all 4 extremities Psychiatric: Normal mood and affect  Laboratory Data: Results for orders placed or performed in visit on 01/16/23  Microscopic Examination   Urine  Result Value Ref Range   WBC, UA 11-30 (A) 0 - 5 /hpf   RBC, Urine 3-10 (A) 0 - 2 /hpf   Epithelial Cells (non renal) 0-10 0 - 10 /hpf   Mucus, UA Present (A) Not Estab.   Bacteria, UA Many (A) None seen/Few   Yeast, UA Present (A) None seen  Urinalysis, Complete  Result Value Ref Range   Specific Gravity, UA 1.025 1.005 - 1.030   pH, UA 5.0 5.0 - 7.5   Color, UA Yellow Yellow   Appearance  Ur Clear Clear   Leukocytes,UA Trace (A) Negative   Protein,UA Negative Negative/Trace   Glucose, UA Negative Negative   Ketones, UA Negative Negative   RBC, UA Trace (A) Negative   Bilirubin, UA Negative Negative   Urobilinogen, Ur 0.2 0.2 - 1.0 mg/dL   Nitrite, UA Negative Negative   Microscopic Examination See below:    Assessment & Plan:   1. Colovesical fistula Her UA today is suspicious, though unsurprising in the setting of her known fistula.  She remains asymptomatic, which would indicate that she may have become colonized, again not unsurprising in the setting of her  fistula.  I am sending her urine for culture today in case she becomes symptomatic, but will defer antibiotic therapy in the interim. - Urinalysis, Complete - CULTURE, URINE COMPREHENSIVE  2. Urinary urgency Stable, minimal patient bother.  We discussed consideration of pharmacotherapy in the future if this worsens.  Return in about 1 year (around 01/16/2024) for Annual follow-up with UA.  Carman Ching, PA-C  [facility] [address] [place], [place] [address] [phone]

## 2023-01-17 LAB — MICROSCOPIC EXAMINATION

## 2023-01-17 LAB — URINALYSIS, COMPLETE
Bilirubin, UA: NEGATIVE
Glucose, UA: NEGATIVE
Ketones, UA: NEGATIVE
Nitrite, UA: NEGATIVE
Protein,UA: NEGATIVE
Specific Gravity, UA: 1.025 (ref 1.005–1.030)
Urobilinogen, Ur: 0.2 mg/dL (ref 0.2–1.0)
pH, UA: 5 (ref 5.0–7.5)

## 2023-01-21 LAB — CULTURE, URINE COMPREHENSIVE

## 2023-02-06 DIAGNOSIS — G8191 Hemiplegia, unspecified affecting right dominant side: Secondary | ICD-10-CM | POA: Diagnosis not present

## 2023-02-13 NOTE — Progress Notes (Unsigned)
Established Patient Office Visit  Subjective:  Patient ID: Terri Werner, female    DOB: 1951-04-30  Age: 72 y.o. MRN: [id number]  CC:  No chief complaint on file.   HPI Terri Werner presents for follow up on follow up. Overall she is going well - had a case of cellulitis recently, was seen at [facility] and was treated with antibiotics with resolution of symptoms.  Hypertension: -Medications: Amlodipine 5 mg, Valsartan-HCTZ 80-12.5 mg daily  -Patient is compliant with above medications and reports no side effects. -Checking BP at home (average): 120-130/70-80 -Denies any SOB, CP, vision changes, LE edema or symptoms of hypotension.   HLD/HX of CVA: -Medications: Crestor discontinued after SATURN trial (no statins until 06/2023) but on Flexeril 5 mg PRN -Patient just finished with PT and OT outpatient. Ambulating well with cane. Doing home exercises. Receiving botox injections in her right lower extremity which have been helping somewhat.  -Last lipid panel: Lipid Panel     Component Value Date/Time   CHOL 168 08/21/2022 0908   TRIG 55 08/21/2022 0908   HDL 48 (L) 08/21/2022 0908   CHOLHDL 3.5 08/21/2022 0908   VLDL 12 06/01/2021 0510   LDLCALC 106 (H) 08/21/2022 0908   Chronic Constipation: -Currently taking Miralax daily and Senna PRN -Having one BM about every 3 days, doing well no changes.  Abnormal Urinary Flow: -Following with Urology, note from 07/16/22 reviewed. Cystoscopy, cystogram and CT A/P negative for signs of fistula. Patient states she only had the gas in the urine symptoms once or twice since testing.   Health Maintenance: -Blood work due -Mammogram 7/23 Birads-1  -Colon cancer screening: Colonoscopy 1/23 -Prevnar 20 due  Past Medical History:  Diagnosis Date   Arthritis    pt reports in back & knees   Colon polyps 2010   At [facility]   Diverticulitis 2016   Genital warts    GERD (gastroesophageal reflux disease)    Heart  murmur    Hepatitis B    History of blood transfusion    during each major surgery per pt   History of chicken pox    History of hiatal hernia    History of torn meniscus of right knee    HTN (hypertension)    Hx of migraine headaches    Hypertension    Hypopotassemia    Pre-diabetes    Stroke (HCC) 05/27/2021    Past Surgical History:  Procedure Laterality Date   ABDOMINAL HYSTERECTOMY  2006   APPENDECTOMY  1980   CESAREAN SECTION  1981   COLONOSCOPY WITH PROPOFOL N/A 11/01/2014   Procedure: COLONOSCOPY WITH PROPOFOL;  Surgeon: Scot Jun, MD;  Location: [facility] ENDOSCOPY;  Service: Endoscopy;  Laterality: N/A;   COLONOSCOPY WITH PROPOFOL N/A 01/05/2020   Procedure: COLONOSCOPY WITH PROPOFOL;  Surgeon: Regis Bill, MD;  Location: [facility] ENDOSCOPY;  Service: Endoscopy;  Laterality: N/A;   CYSTOSCOPY WITH BIOPSY N/A 02/19/2022   Procedure: CYSTOSCOPY WITH BLADDER BIOPSY;  Surgeon: Vanna Scotland, MD;  Location: [facility] ORS;  Service: Urology;  Laterality: N/A;   ECTOPIC PREGNANCY SURGERY  1980   HERNIA REPAIR  2006   Umbilical   KNEE ARTHROSCOPY Left 2006   torn meniscus   TONSILLECTOMY  1971   TOTAL ABDOMINAL HYSTERECTOMY W/ BILATERAL SALPINGOOPHORECTOMY  2006   VESICOVAGINAL FISTULA CLOSURE W/ TAH      Family History  Problem Relation Age of Onset   Breast cancer Sister 77   Hypertension Mother  Alzheimer's disease Mother    Dementia Mother    Hypertension Maternal Grandfather    Hyperlipidemia Maternal Grandfather    Birth defects Maternal Grandmother        Bladder   Colon cancer Maternal Grandmother    Congestive Heart Failure Father    Hyperlipidemia Father    Dementia Paternal Grandmother    Dementia Paternal Grandfather    Thyroid disease Sister     Social History   Socioeconomic History   Marital status: Widowed    Spouse name: Not on file   Number of children: 2   Years of education: Not on file   Highest education level: Master's degree  (e.g., MA, MS, MEng, MEd, MSW, MBA)  Occupational History   Occupation: [employer]    Comment: Full Time    Employer: [employer]  Tobacco Use   Smoking status: Never   Smokeless tobacco: Never  Vaping Use   Vaping status: Never Used  Substance and Sexual Activity   Alcohol use: Not Currently    Comment: rare   Drug use: No   Sexual activity: Not Currently    Partners: Male  Other Topics Concern   Not on file  Social History Narrative   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      Regular Exercise -  NO   Daily Caffeine Use:  1 soda/tea          Social Determinants of Health   Financial Resource Strain: Medium Risk (08/20/2022)   Overall Financial Resource Strain (CARDIA)    Difficulty of Paying Living Expenses: Somewhat hard  Food Insecurity: No Food Insecurity (08/20/2022)   Hunger Vital Sign    Worried About Running Out of Food in the Last Year: Never true    Ran Out of Food in the Last Year: Never true  Transportation Needs: No Transportation Needs (08/20/2022)   PRAPARE - Administrator, Civil Service (Medical): No    Lack of Transportation (Non-Medical): No  Physical Activity: Sufficiently Active (08/20/2022)   Exercise Vital Sign    Days of Exercise per Week: 3 days    Minutes of Exercise per Session: 50 min  Stress: No Stress Concern Present (08/20/2022)   [employer] of Occupational Health - Occupational Stress Questionnaire    Feeling of Stress : Only a little  Social Connections: Moderately Integrated (08/20/2022)   Social Connection and Isolation Panel [NHANES]    Frequency of Communication with Friends and Family: More than three times a week    Frequency of Social Gatherings with Friends and Family: More than three times a week    Attends Religious Services: More than 4 times per year    Active Member of [employer] or  Organizations: Yes    Attends Banker Meetings: More than 4 times per year    Marital Status: Widowed  Intimate Partner Violence: Not At Risk (07/19/2022)   Humiliation, Afraid, Rape, and Kick questionnaire    Fear of Current or Ex-Partner: No    Emotionally Abused: No    Physically Abused: No    Sexually Abused: No    Outpatient Medications Prior to Visit  Medication Sig Dispense Refill   acetaminophen (TYLENOL) 325 MG tablet Take 2 tablets (650 mg total) by mouth every 4 (four) hours as needed for mild pain (or temp > 37.5 C (99.5 F)).  acidophilus (RISAQUAD) CAPS capsule Take 1 capsule by mouth daily. 30 capsule 0   amLODipine (NORVASC) 5 MG tablet TAKE 1 TABLET (5 MG TOTAL) BY MOUTH DAILY. 90 tablet 0   cyclobenzaprine (FLEXERIL) 5 MG tablet 1 tablet at bedtime as needed Orally Once a day     diclofenac Sodium (VOLTAREN) 1 % GEL Apply 4 g topically 4 (four) times daily. 400 g 0   lidocaine (XYLOCAINE) 5 % ointment Apply 1 Application topically as needed. 35.44 g 2   polyethylene glycol powder (GLYCOLAX/MIRALAX) 17 GM/SCOOP powder Take 17 g by mouth daily as needed for moderate constipation. 3350 g 1   senna-docusate (SENOKOT-S) 8.6-50 MG tablet Take 1 tablet by mouth 2 (two) times daily. 90 tablet 3   valsartan-hydrochlorothiazide (DIOVAN-HCT) 80-12.5 MG tablet Take 1 tablet by mouth daily. 90 tablet 1   No facility-administered medications prior to visit.    Allergies  Allergen Reactions   Latex Itching, Rash and Anaphylaxis    Burning   Shellfish-Derived Products Anaphylaxis   Shrimp [Shellfish Allergy] Swelling    Lips will burn     ROS Review of Systems  Constitutional:  Negative for chills and fever.  Eyes:  Negative for visual disturbance.  Respiratory:  Negative for shortness of breath.   Cardiovascular:  Negative for chest pain and palpitations.  Genitourinary:  Negative for dysuria.  Neurological:  Negative for dizziness and headaches.       Objective:    Physical Exam Constitutional:      Appearance: Normal appearance.     Comments: Walking with cane  HENT:     Head: Normocephalic and atraumatic.  Eyes:     Conjunctiva/sclera: Conjunctivae normal.  Cardiovascular:     Rate and Rhythm: Normal rate and regular rhythm.  Pulmonary:     Effort: Pulmonary effort is normal.     Breath sounds: Normal breath sounds.  Musculoskeletal:     Right lower leg: Edema present.     Left lower leg: Edema present.  Skin:    General: Skin is warm and dry.     Comments: Cellulitis resolved  Neurological:     Mental Status: She is alert. Mental status is at baseline.  Psychiatric:        Mood and Affect: Mood normal.        Behavior: Behavior normal.    There were no vitals taken for this visit. Wt Readings from Last 3 Encounters:  01/16/23 202 lb (91.6 kg)  08/24/22 216 lb (98 kg)  08/21/22 215 lb 1.6 oz (97.6 kg)   There were no vitals filed for this visit.      There are no preventive care reminders to display for this patient.   There are no preventive care reminders to display for this patient.  Lab Results  Component Value Date   TSH 1.80 02/27/2021   Lab Results  Component Value Date   WBC 5.0 08/21/2022   HGB 12.0 08/21/2022   HCT 35.8 08/21/2022   MCV 85.6 08/21/2022   PLT 199 08/21/2022   Lab Results  Component Value Date   NA 141 08/21/2022   K 4.3 08/21/2022   CO2 30 08/21/2022   GLUCOSE 98 08/21/2022   BUN 14 08/21/2022   CREATININE 0.85 08/21/2022   BILITOT 0.3 08/21/2022   ALKPHOS 40 06/01/2021   AST 19 08/21/2022   ALT 14 08/21/2022   PROT 7.4 08/21/2022   ALBUMIN 3.2 (L) 06/01/2021   CALCIUM 9.5 08/21/2022   ANIONGAP  7 02/13/2022   EGFR 74 08/21/2022   GFR 82.53 07/05/2014   Lab Results  Component Value Date   CHOL 168 08/21/2022   Lab Results  Component Value Date   HDL 48 (L) 08/21/2022   Lab Results  Component Value Date   LDLCALC 106 (H) 08/21/2022   Lab Results   Component Value Date   TRIG 55 08/21/2022   Lab Results  Component Value Date   CHOLHDL 3.5 08/21/2022   Lab Results  Component Value Date   HGBA1C 6.3 (H) 08/21/2022      Assessment & Plan:   1. Hypertension, benign: Chronic and well controlled. Continue Amlodipine 5 mg and Valsartan-HCTZ 80-12.5 mg daily, refilled. Labs due. Follow up in 6 months or sooner as needed.  - CBC w/Diff/Platelet - COMPLETE METABOLIC PANEL WITH GFR - Magnesium - amLODipine (NORVASC) 5 MG tablet; Take 1 tablet (5 mg total) by mouth daily.  Dispense: 90 tablet; Refill: 1 - valsartan-hydrochlorothiazide (DIOVAN-HCT) 80-12.5 MG tablet; Take 1 tablet by mouth daily.  Dispense: 90 tablet; Refill: 1  2. Pure hypercholesterolemia: Recheck cholesterol, currently participating in SATURN trial.   - Lipid Profile  3. Pre-diabetes: Recheck A1c.  - HgB A1c  4. Vitamin D deficiency: Check vitamin d level, currently on supplements.   - Vitamin D (25 hydroxy)  5. Vaccine for streptococcus pneumoniae and influenza: Prevnar 20 administered.   - Pneumococcal conjugate vaccine 20-valent (Prevnar 20)    Follow-up: No follow-ups on file.    Margarita Mail, DO

## 2023-02-14 ENCOUNTER — Encounter: Payer: Self-pay | Admitting: Internal Medicine

## 2023-02-14 ENCOUNTER — Ambulatory Visit: Payer: Medicare Other | Admitting: Internal Medicine

## 2023-02-14 VITALS — BP 126/74 | HR 81 | Temp 98.3°F | Resp 18 | Ht 62.0 in | Wt 210.8 lb

## 2023-02-14 DIAGNOSIS — Z8673 Personal history of transient ischemic attack (TIA), and cerebral infarction without residual deficits: Secondary | ICD-10-CM

## 2023-02-14 DIAGNOSIS — I10 Essential (primary) hypertension: Secondary | ICD-10-CM | POA: Diagnosis not present

## 2023-02-14 DIAGNOSIS — R7303 Prediabetes: Secondary | ICD-10-CM

## 2023-02-14 DIAGNOSIS — G5603 Carpal tunnel syndrome, bilateral upper limbs: Secondary | ICD-10-CM

## 2023-02-14 DIAGNOSIS — E7800 Pure hypercholesterolemia, unspecified: Secondary | ICD-10-CM

## 2023-02-14 DIAGNOSIS — M21371 Foot drop, right foot: Secondary | ICD-10-CM

## 2023-02-14 DIAGNOSIS — M62838 Other muscle spasm: Secondary | ICD-10-CM

## 2023-02-14 LAB — POCT GLYCOSYLATED HEMOGLOBIN (HGB A1C): Hemoglobin A1C: 5.9 % — AB (ref 4.0–5.6)

## 2023-02-14 MED ORDER — AMLODIPINE BESYLATE 5 MG PO TABS: 5.0000 mg | ORAL_TABLET | Freq: Every day | ORAL | 1 refills | Status: DC

## 2023-02-14 MED ORDER — ROSUVASTATIN CALCIUM 10 MG PO TABS: 10.0000 mg | ORAL_TABLET | Freq: Every day | ORAL | 1 refills | Status: DC

## 2023-02-14 MED ORDER — CYCLOBENZAPRINE HCL 5 MG PO TABS
5.0000 mg | ORAL_TABLET | Freq: Every evening | ORAL | 1 refills | Status: DC | PRN
Start: 2023-02-14 — End: 2023-08-15

## 2023-02-14 MED ORDER — VALSARTAN-HYDROCHLOROTHIAZIDE 80-12.5 MG PO TABS
1.0000 | ORAL_TABLET | Freq: Every day | ORAL | 1 refills | Status: DC
Start: 2023-02-14 — End: 2023-08-15

## 2023-02-18 DIAGNOSIS — Z01419 Encounter for gynecological examination (general) (routine) without abnormal findings: Secondary | ICD-10-CM | POA: Diagnosis not present

## 2023-02-19 DIAGNOSIS — M189 Osteoarthritis of first carpometacarpal joint, unspecified: Secondary | ICD-10-CM | POA: Diagnosis not present

## 2023-02-19 DIAGNOSIS — M1812 Unilateral primary osteoarthritis of first carpometacarpal joint, left hand: Secondary | ICD-10-CM | POA: Diagnosis not present

## 2023-02-19 DIAGNOSIS — M180 Bilateral primary osteoarthritis of first carpometacarpal joints: Secondary | ICD-10-CM | POA: Diagnosis not present

## 2023-02-20 ENCOUNTER — Ambulatory Visit: Payer: Medicare Other | Admitting: Internal Medicine

## 2023-02-26 DIAGNOSIS — M79671 Pain in right foot: Secondary | ICD-10-CM | POA: Diagnosis not present

## 2023-02-26 DIAGNOSIS — M545 Low back pain, unspecified: Secondary | ICD-10-CM | POA: Diagnosis not present

## 2023-02-28 ENCOUNTER — Inpatient Hospital Stay
Admission: EM | Admit: 2023-02-28 | Discharge: 2023-03-06 | DRG: 336 | Disposition: A | Discharge status: Home / Self Care | Payer: Medicare Other | Attending: Obstetrics and Gynecology | Admitting: Obstetrics and Gynecology

## 2023-02-28 ENCOUNTER — Ambulatory Visit: Payer: Medicare Other | Admitting: Physician Assistant

## 2023-02-28 ENCOUNTER — Telehealth: Payer: Medicare Other | Admitting: Physician Assistant

## 2023-02-28 ENCOUNTER — Encounter: Payer: Self-pay | Admitting: Emergency Medicine

## 2023-02-28 ENCOUNTER — Emergency Department: Payer: Medicare Other

## 2023-02-28 ENCOUNTER — Other Ambulatory Visit: Payer: Self-pay

## 2023-02-28 DIAGNOSIS — K429 Umbilical hernia without obstruction or gangrene: Secondary | ICD-10-CM | POA: Diagnosis not present

## 2023-02-28 DIAGNOSIS — K56609 Unspecified intestinal obstruction, unspecified as to partial versus complete obstruction: Secondary | ICD-10-CM | POA: Diagnosis not present

## 2023-02-28 DIAGNOSIS — Z90710 Acquired absence of both cervix and uterus: Secondary | ICD-10-CM | POA: Diagnosis not present

## 2023-02-28 DIAGNOSIS — K5900 Constipation, unspecified: Secondary | ICD-10-CM

## 2023-02-28 DIAGNOSIS — Z820 Family history of epilepsy and other diseases of the nervous system: Secondary | ICD-10-CM

## 2023-02-28 DIAGNOSIS — K56600 Partial intestinal obstruction, unspecified as to cause: Secondary | ICD-10-CM | POA: Diagnosis not present

## 2023-02-28 DIAGNOSIS — K219 Gastro-esophageal reflux disease without esophagitis: Secondary | ICD-10-CM | POA: Diagnosis present

## 2023-02-28 DIAGNOSIS — K432 Incisional hernia without obstruction or gangrene: Secondary | ICD-10-CM | POA: Diagnosis not present

## 2023-02-28 DIAGNOSIS — Z803 Family history of malignant neoplasm of breast: Secondary | ICD-10-CM | POA: Diagnosis not present

## 2023-02-28 DIAGNOSIS — Z8349 Family history of other endocrine, nutritional and metabolic diseases: Secondary | ICD-10-CM

## 2023-02-28 DIAGNOSIS — Z8249 Family history of ischemic heart disease and other diseases of the circulatory system: Secondary | ICD-10-CM | POA: Diagnosis not present

## 2023-02-28 DIAGNOSIS — Z79899 Other long term (current) drug therapy: Secondary | ICD-10-CM

## 2023-02-28 DIAGNOSIS — Z800 Family history of malignant neoplasm of digestive organs: Secondary | ICD-10-CM | POA: Diagnosis not present

## 2023-02-28 DIAGNOSIS — R7303 Prediabetes: Secondary | ICD-10-CM | POA: Diagnosis not present

## 2023-02-28 DIAGNOSIS — K449 Diaphragmatic hernia without obstruction or gangrene: Secondary | ICD-10-CM | POA: Diagnosis not present

## 2023-02-28 DIAGNOSIS — Z91040 Latex allergy status: Secondary | ICD-10-CM

## 2023-02-28 DIAGNOSIS — R1031 Right lower quadrant pain: Principal | ICD-10-CM

## 2023-02-28 DIAGNOSIS — Z4682 Encounter for fitting and adjustment of non-vascular catheter: Secondary | ICD-10-CM | POA: Diagnosis not present

## 2023-02-28 DIAGNOSIS — R112 Nausea with vomiting, unspecified: Secondary | ICD-10-CM

## 2023-02-28 DIAGNOSIS — K5909 Other constipation: Secondary | ICD-10-CM | POA: Diagnosis present

## 2023-02-28 DIAGNOSIS — K5730 Diverticulosis of large intestine without perforation or abscess without bleeding: Secondary | ICD-10-CM | POA: Diagnosis not present

## 2023-02-28 DIAGNOSIS — I10 Essential (primary) hypertension: Secondary | ICD-10-CM | POA: Diagnosis present

## 2023-02-28 DIAGNOSIS — Z83438 Family history of other disorder of lipoprotein metabolism and other lipidemia: Secondary | ICD-10-CM

## 2023-02-28 DIAGNOSIS — K567 Ileus, unspecified: Secondary | ICD-10-CM | POA: Diagnosis not present

## 2023-02-28 DIAGNOSIS — Z90722 Acquired absence of ovaries, bilateral: Secondary | ICD-10-CM

## 2023-02-28 DIAGNOSIS — K5651 Intestinal adhesions [bands], with partial obstruction: Secondary | ICD-10-CM | POA: Diagnosis not present

## 2023-02-28 DIAGNOSIS — Z91013 Allergy to seafood: Secondary | ICD-10-CM | POA: Diagnosis not present

## 2023-02-28 DIAGNOSIS — K430 Incisional hernia with obstruction, without gangrene: Principal | ICD-10-CM | POA: Diagnosis present

## 2023-02-28 DIAGNOSIS — Z8673 Personal history of transient ischemic attack (TIA), and cerebral infarction without residual deficits: Secondary | ICD-10-CM | POA: Diagnosis not present

## 2023-02-28 DIAGNOSIS — K660 Peritoneal adhesions (postprocedural) (postinfection): Secondary | ICD-10-CM | POA: Diagnosis not present

## 2023-02-28 LAB — CBC
HCT: 39.9 % (ref 36.0–46.0)
Hemoglobin: 13.2 g/dL (ref 12.0–15.0)
MCH: 28.6 pg (ref 26.0–34.0)
MCHC: 33.1 g/dL (ref 30.0–36.0)
MCV: 86.6 fL (ref 80.0–100.0)
Platelets: 197 10*3/uL (ref 150–400)
RBC: 4.61 MIL/uL (ref 3.87–5.11)
RDW: 14.9 % (ref 11.5–15.5)
WBC: 7.8 10*3/uL (ref 4.0–10.5)
nRBC: 0 % (ref 0.0–0.2)

## 2023-02-28 LAB — COMPREHENSIVE METABOLIC PANEL
ALT: 17 U/L (ref 0–44)
AST: 22 U/L (ref 15–41)
Albumin: 4.4 g/dL (ref 3.5–5.0)
Alkaline Phosphatase: 50 U/L (ref 38–126)
Anion gap: 9 (ref 5–15)
BUN: 20 mg/dL (ref 8–23)
CO2: 29 mmol/L (ref 22–32)
Calcium: 9.8 mg/dL (ref 8.9–10.3)
Chloride: 99 mmol/L (ref 98–111)
Creatinine, Ser: 1.01 mg/dL — ABNORMAL HIGH (ref 0.44–1.00)
GFR, Estimated: 60 mL/min — ABNORMAL LOW (ref >60)
Glucose, Bld: 147 mg/dL — ABNORMAL HIGH (ref 70–99)
Potassium: 3.9 mmol/L (ref 3.5–5.1)
Sodium: 137 mmol/L (ref 135–145)
Total Bilirubin: 0.8 mg/dL (ref 0.3–1.2)
Total Protein: 8.5 g/dL — ABNORMAL HIGH (ref 6.5–8.1)

## 2023-02-28 LAB — URINALYSIS, ROUTINE W REFLEX MICROSCOPIC
Bacteria, UA: NONE SEEN
Bilirubin Urine: NEGATIVE
Glucose, UA: NEGATIVE mg/dL
Hgb urine dipstick: NEGATIVE
Ketones, ur: 20 mg/dL — AB
Nitrite: NEGATIVE
Protein, ur: NEGATIVE mg/dL
Specific Gravity, Urine: 1.041 — ABNORMAL HIGH (ref 1.005–1.030)
pH: 6 (ref 5.0–8.0)

## 2023-02-28 LAB — LIPASE, BLOOD: Lipase: 32 U/L (ref 11–51)

## 2023-02-28 MED ORDER — ONDANSETRON HCL 4 MG PO TABS: 4.0000 mg | ORAL_TABLET | Freq: Four times a day (QID) | ORAL | Status: DC | PRN

## 2023-02-28 MED ORDER — CYCLOBENZAPRINE HCL 10 MG PO TABS
5.0000 mg | ORAL_TABLET | Freq: Every evening | ORAL | Status: DC | PRN
  Administered 2023-03-03: 5 mg via ORAL
  Filled 2023-02-28: qty 1

## 2023-02-28 MED ORDER — ACETAMINOPHEN 325 MG PO TABS: 650.0000 mg | ORAL_TABLET | ORAL | Status: DC | PRN

## 2023-02-28 MED ORDER — PANTOPRAZOLE SODIUM 40 MG PO TBEC
40.0000 mg | DELAYED_RELEASE_TABLET | Freq: Every day | ORAL | Status: DC
  Administered 2023-02-28: 40 mg via ORAL
  Filled 2023-02-28 (×2): qty 1

## 2023-02-28 MED ORDER — IOHEXOL 300 MG/ML  SOLN
100.0000 mL | Freq: Once | INTRAMUSCULAR | Status: AC | PRN
  Administered 2023-02-28: 100 mL via INTRAVENOUS

## 2023-02-28 MED ORDER — ALBUTEROL SULFATE (2.5 MG/3ML) 0.083% IN NEBU: 2.5000 mg | INHALATION_SOLUTION | RESPIRATORY_TRACT | Status: DC | PRN

## 2023-02-28 MED ORDER — ONDANSETRON HCL 4 MG/2ML IJ SOLN
4.0000 mg | Freq: Four times a day (QID) | INTRAMUSCULAR | Status: DC | PRN
  Administered 2023-02-28 – 2023-03-01 (×2): 4 mg via INTRAVENOUS
  Filled 2023-02-28 (×2): qty 2

## 2023-02-28 MED ORDER — OXYCODONE HCL 5 MG PO TABS
5.0000 mg | ORAL_TABLET | ORAL | Status: DC | PRN
  Administered 2023-03-01 – 2023-03-05 (×6): 5 mg via ORAL
  Filled 2023-02-28 (×6): qty 1

## 2023-02-28 MED ORDER — MORPHINE SULFATE (PF) 2 MG/ML IV SOLN
2.0000 mg | INTRAVENOUS | Status: DC | PRN
  Administered 2023-03-01 – 2023-03-05 (×5): 2 mg via INTRAVENOUS
  Filled 2023-02-28 (×6): qty 1

## 2023-02-28 MED ORDER — DEXTROSE-SODIUM CHLORIDE 5-0.9 % IV SOLN: INTRAVENOUS | Status: AC

## 2023-02-28 MED ORDER — DIATRIZOATE MEGLUMINE & SODIUM 66-10 % PO SOLN
90.0000 mL | Freq: Once | ORAL | Status: AC
  Administered 2023-02-28: 90 mL via ORAL

## 2023-02-28 MED ORDER — ENOXAPARIN SODIUM 60 MG/0.6ML IJ SOSY
45.0000 mg | PREFILLED_SYRINGE | INTRAMUSCULAR | Status: DC
  Administered 2023-02-28 – 2023-03-05 (×6): 45 mg via SUBCUTANEOUS
  Filled 2023-02-28 (×6): qty 0.6

## 2023-02-28 MED ORDER — FENTANYL CITRATE PF 50 MCG/ML IJ SOSY
50.0000 ug | PREFILLED_SYRINGE | Freq: Once | INTRAMUSCULAR | Status: AC
  Administered 2023-02-28: 50 ug via INTRAVENOUS
  Filled 2023-02-28: qty 1

## 2023-02-28 MED ORDER — AMLODIPINE BESYLATE 5 MG PO TABS
5.0000 mg | ORAL_TABLET | Freq: Every day | ORAL | Status: DC
  Administered 2023-02-28 – 2023-03-06 (×5): 5 mg via ORAL
  Filled 2023-02-28 (×7): qty 1

## 2023-02-28 MED ORDER — ONDANSETRON HCL 4 MG/2ML IJ SOLN
4.0000 mg | Freq: Once | INTRAMUSCULAR | Status: AC
  Administered 2023-02-28: 4 mg via INTRAVENOUS
  Filled 2023-02-28: qty 2

## 2023-02-28 MED ORDER — ALUM & MAG HYDROXIDE-SIMETH 200-200-20 MG/5ML PO SUSP
30.0000 mL | ORAL | Status: DC | PRN
  Administered 2023-02-28: 30 mL via ORAL
  Filled 2023-02-28: qty 30

## 2023-02-28 NOTE — Progress Notes (Signed)
Virtual Visit Consent   Terri Werner, you are scheduled for a virtual visit with a [facility] provider today. Just as with appointments in the office, your consent must be obtained to participate. Your consent will be active for this visit and any virtual visit you may have with one of our providers in the next 365 days. If you have a MyChart account, a copy of this consent can be sent to you electronically.  As this is a virtual visit, video technology does not allow for your provider to perform a traditional examination. This may limit your provider's ability to fully assess your condition. If your provider identifies any concerns that need to be evaluated in person or the need to arrange testing (such as labs, EKG, etc.), we will make arrangements to do so. Although advances in technology are sophisticated, we cannot ensure that it will always work on either your end or our end. If the connection with a video visit is poor, the visit may have to be switched to a telephone visit. With either a video or telephone visit, we are not always able to ensure that we have a secure connection.  By engaging in this virtual visit, you consent to the provision of healthcare and authorize for your insurance to be billed (if applicable) for the services provided during this visit. Depending on your insurance coverage, you may receive a charge related to this service.  I need to obtain your verbal consent now. Are you willing to proceed with your visit today? Terri Werner has provided verbal consent on 02/28/2023 for a virtual visit (video or telephone). Terri Werner, [place]  Date: 02/28/2023 8:49 AM  Virtual Visit via Video Note   I, Terri Werner, connected with  Terri Werner  ([id number], [id number]) on 02/28/23 at  8:45 AM EDT by a video-enabled telemedicine application and verified that I am speaking with the correct person using two  identifiers.  Location: Patient: Virtual Visit Location Patient: Home Provider: Virtual Visit Location Provider: Home Office   I discussed the limitations of evaluation and management by telemedicine and the availability of in person appointments. The patient expressed understanding and agreed to proceed.    History of Present Illness: Terri Werner is a 72 y.o. who identifies as a female who was assigned female at birth, and is being seen today for substantial abdominal pain. Patient endorses 2AM woke up with crushing abdominal pain in her RLQ and R middle abdomen and nausea. Went to the bathroom and had some episodes of non-bloody emesis. Last episode of emesis was an hour ago. Since then having continued pain in her side -- ranked at about a 8/10. Last bowel movement was overnight last night but unable to pass much stool. Has history of chronic constipation. Has taken laxatives but still without relief. Notes chills and shakes alternating with feeling sweaty.    HPI: HPI  Problems:  Patient Active Problem List   Diagnosis Date Noted   Hx of spontaneous intraparenchymal intracranial hemorrhage 07/12/2021   Intraparenchymal hemorrhage of brain (HCC) 05/31/2021   ICH (intracerebral hemorrhage) (HCC) 05/27/2021   Morbid obesity (HCC) 09/24/2018   Unspecified inflammatory spondylopathy, lumbar region (HCC) 03/05/2018   Lumbar spondylolysis 08/20/2017   Lumbar facet arthropathy 08/20/2017   Lumbar degenerative disc disease 08/20/2017   Chronic constipation 07/30/2017   Primary osteoarthritis involving multiple joints 04/17/2017   Postmenopausal 10/12/2016   Prediabetes 10/12/2016   Lumbago 08/04/2015   Diverticulitis of colon 07/05/2014  Carotid artery disorder (HCC) 04/27/2014   Hyperlipidemia 10/08/2013   Hypertension, benign 11/19/2009    Allergies:  Allergies  Allergen Reactions   Latex Itching, Rash and Anaphylaxis    Burning   Shellfish-Derived Products  Anaphylaxis   Shrimp [Shellfish Allergy] Swelling    Lips will burn    Medications:  Current Outpatient Medications:    acetaminophen (TYLENOL) 325 MG tablet, Take 2 tablets (650 mg total) by mouth every 4 (four) hours as needed for mild pain (or temp > 37.5 C (99.5 F))., Disp: , Rfl:    acidophilus (RISAQUAD) CAPS capsule, Take 1 capsule by mouth daily., Disp: 30 capsule, Rfl: 0   amLODipine (NORVASC) 5 MG tablet, Take 1 tablet (5 mg total) by mouth daily., Disp: 90 tablet, Rfl: 1   cyclobenzaprine (FLEXERIL) 5 MG tablet, Take 1 tablet (5 mg total) by mouth at bedtime as needed for muscle spasms., Disp: 90 tablet, Rfl: 1   diclofenac Sodium (VOLTAREN) 1 % GEL, Apply 4 g topically 4 (four) times daily., Disp: 400 g, Rfl: 0   lidocaine (XYLOCAINE) 5 % ointment, Apply 1 Application topically as needed., Disp: 35.44 g, Rfl: 2   polyethylene glycol powder (GLYCOLAX/MIRALAX) 17 GM/SCOOP powder, Take 17 g by mouth daily as needed for moderate constipation., Disp: 3350 g, Rfl: 1   rosuvastatin (CRESTOR) 10 MG tablet, Take 1 tablet (10 mg total) by mouth daily., Disp: 90 tablet, Rfl: 1   senna-docusate (SENOKOT-S) 8.6-50 MG tablet, Take 1 tablet by mouth 2 (two) times daily., Disp: 90 tablet, Rfl: 3   valsartan-hydrochlorothiazide (DIOVAN-HCT) 80-12.5 MG tablet, Take 1 tablet by mouth daily., Disp: 90 tablet, Rfl: 1  Observations/Objective: Patient is well-developed, well-nourished in no acute distress.  Resting comfortably at home.  Head is normocephalic, atraumatic.  No labored breathing. Speech is clear and coherent with logical content.  Patient is alert and oriented at baseline.   Assessment and Plan: 1. RLQ abdominal pain  2. Constipation, unspecified constipation type  3. Nausea and vomiting, unspecified vomiting type  Giving age, history and current symptoms including substantial level of pain, needs ER evaluation. She agrees to have her sister carry her to nearest facility now. EMS  discussed and she will consider.  Follow Up Instructions: I discussed the assessment and treatment plan with the patient. The patient was provided an opportunity to ask questions and all were answered. The patient agreed with the plan and demonstrated an understanding of the instructions.  A copy of instructions were sent to the patient via MyChart unless otherwise noted below.   The patient was advised to call back or seek an in-person evaluation if the symptoms worsen or if the condition fails to improve as anticipated.    Terri Climes, PA-C

## 2023-02-28 NOTE — ED Provider Notes (Signed)
[facility] Provider Note    Event Date/Time   First MD Initiated Contact with Patient 02/28/23 1113     (approximate)   History   Abdominal Pain   HPI  Terri Werner is a 72 y.o. female comes in with right lower quadrant pain and nonbloody emesis. Reviewed the urgent care note from today.  Patient reports that her granddaughter was also feeling a little sick to the stomach so she was not sure if it could just be food poisoning.  She reports the pain however started more suddenly in the right lower abdomen.  Denies any history of kidney stones.  She denies any urinary symptoms.  She states it is associated with some nausea, vomiting.  Denies any diarrhea.  She denies any headaches, falls hitting her head, chest pain, shortness of breath.  Physical Exam   Triage Vital Signs: ED Triage Vitals  Encounter Vitals Group     BP 02/28/23 1107 137/82     Systolic BP Percentile --      Diastolic BP Percentile --      Pulse Rate 02/28/23 1107 77     Resp 02/28/23 1107 16     Temp 02/28/23 1107 98.3 F (36.8 C)     Temp Source 02/28/23 1107 Oral     SpO2 02/28/23 1107 100 %     Weight --      Height --      Head Circumference --      Peak Flow --      Pain Score 02/28/23 1108 8     Pain Loc --      Pain Education --      Exclude from Growth Chart --     Most recent vital signs: Vitals:   02/28/23 1107  BP: 137/82  Pulse: 77  Resp: 16  Temp: 98.3 F (36.8 C)  SpO2: 100%     General: Awake, no distress.  CV:  Good peripheral perfusion.  Resp:  Normal effort.  Abd:  No distention. Tender in the right lower quadrant without any rebound or guarding. Other:     ED Results / Procedures / Treatments   Labs (all labs ordered are listed, but only abnormal results are displayed) Labs Reviewed  COMPREHENSIVE METABOLIC PANEL - Abnormal; Notable for the following components:      Result Value   Glucose, Bld 147 (*)    Creatinine, Ser  1.01 (*)    Total Protein 8.5 (*)    GFR, Estimated 60 (*)    All other components within normal limits  LIPASE, BLOOD  CBC  URINALYSIS, ROUTINE W REFLEX MICROSCOPIC      RADIOLOGY I have reviewed the CT personally and interpreted and patient has some dilated loops of bowel pending CT read   PROCEDURES:  Critical Care performed: No  Procedures   MEDICATIONS ORDERED IN ED: Medications  fentaNYL (SUBLIMAZE) injection 50 mcg (has no administration in time range)  ondansetron (ZOFRAN) injection 4 mg (has no administration in time range)     IMPRESSION / MDM / ASSESSMENT AND PLAN / ED COURSE  I reviewed the triage vital signs and the nursing notes.   Patient's presentation is most consistent with acute presentation with potential threat to life or bodily function.   Patient comes in with right lower quadrant pain.  Differential includes diverticulitis, appendicitis, perforation.  No right upper quadrant pain to suggest gallstones.  No chest pain or epigastric pain to suggest ACS.  Patient ordered some IV nausea and pain medication to help with symptoms.  CBC reassuring lipase normal CMP slightly elevated creatinine but is had similar slightly elevated creatinines previously.  2:58 PM reevaluated patient updated her on the delay with CT imaging.  I tried to call radiology multiple times to try to get a read.  Patient will be handed off to oncoming team pending the CT read and final disposition She declines needing anything for pain at this time  The patient is on the cardiac monitor to evaluate for evidence of arrhythmia and/or significant heart rate changes.      FINAL CLINICAL IMPRESSION(S) / ED DIAGNOSES   Final diagnoses:  Right lower quadrant abdominal pain     Rx / DC Orders   ED Discharge Orders     None        Note:  This document was prepared using Dragon voice recognition software and may include unintentional dictation errors.   Concha Se,  MD 02/28/23 [phone]

## 2023-02-28 NOTE — Patient Instructions (Signed)
Terri Werner, thank you for joining [name], PA-C for today's virtual visit.  While this provider is not your primary care provider (PCP), if your PCP is located in our provider database this encounter information will be shared with them immediately following your visit.   A [facility] MyChart account gives you access to today's visit and all your visits, tests, and labs performed at [facility] " click here if you don't have a [facility] MyChart account or go to [email or web address]  Consent: (Patient) Terri Werner provided verbal consent for this virtual visit at the beginning of the encounter.  Current Medications:  Current Outpatient Medications:    acetaminophen (TYLENOL) 325 MG tablet, Take 2 tablets (650 mg total) by mouth every 4 (four) hours as needed for mild pain (or temp > 37.5 C (99.5 F))., Disp: , Rfl:    acidophilus (RISAQUAD) CAPS capsule, Take 1 capsule by mouth daily., Disp: 30 capsule, Rfl: 0   amLODipine (NORVASC) 5 MG tablet, Take 1 tablet (5 mg total) by mouth daily., Disp: 90 tablet, Rfl: 1   cyclobenzaprine (FLEXERIL) 5 MG tablet, Take 1 tablet (5 mg total) by mouth at bedtime as needed for muscle spasms., Disp: 90 tablet, Rfl: 1   diclofenac Sodium (VOLTAREN) 1 % GEL, Apply 4 g topically 4 (four) times daily., Disp: 400 g, Rfl: 0   lidocaine (XYLOCAINE) 5 % ointment, Apply 1 Application topically as needed., Disp: 35.44 g, Rfl: 2   polyethylene glycol powder (GLYCOLAX/MIRALAX) 17 GM/SCOOP powder, Take 17 g by mouth daily as needed for moderate constipation., Disp: 3350 g, Rfl: 1   rosuvastatin (CRESTOR) 10 MG tablet, Take 1 tablet (10 mg total) by mouth daily., Disp: 90 tablet, Rfl: 1   senna-docusate (SENOKOT-S) 8.6-50 MG tablet, Take 1 tablet by mouth 2 (two) times daily., Disp: 90 tablet, Rfl: 3   valsartan-hydrochlorothiazide (DIOVAN-HCT) 80-12.5 MG tablet, Take 1 tablet by mouth daily., Disp: 90 tablet, Rfl:  1   Medications ordered in this encounter:  No orders of the defined types were placed in this encounter.    *If you need refills on other medications prior to your next appointment, please contact your pharmacy*  Follow-Up: Call back or seek an in-person evaluation if the symptoms worsen or if the condition fails to improve as anticipated.  [facility] [phone]  Other Instructions Based on what you shared with me, I feel your condition warrants further evaluation as soon as possible at an Emergency department.      If you are having a true medical emergency please call 911.      Emergency Department-[facility]  Get Driving Directions  [phone]  [address]  [place], [place] [address]  Open 24/7/365      [facility] Emergency Department at [facility]  Get Driving Directions  [address]  [place], [place] [address]  Open 24/7/365    Emergency Department- [facility]  Get Driving Directions  [phone]  [address]  [place], [place] [address]  Open 24/7/365      [facility] at [facility]  Get Driving Directions  [phone]  [address]  [place], [place] [address]  Open 24/7/365    [facility]  Emergency Department- [facility]  Get Driving Directions  [phone]  [address]  [place], [place] [address]  Open 24/7/365    [place]  Emergency Department- [facility]  [facility]  Get Driving Directions  [address]  [place], [place] [address]  Open 24/7/365    [facility]  Emergency Department- [facility]  Get Driving Directions  [phone]  [address]  [place], [place] [address]  Open 24/7/365      If you have been instructed to have an in-person evaluation today at a local Urgent Care facility, please use the link below. It will take you to a list of  all of our available [facility], including address, phone number and hours of operation. Please do not delay care.  [facility]  If you or a family member do not have a primary care provider, use the link below to schedule a visit and establish care. When you choose a [facility] primary care physician or advanced practice provider, you gain a long-term partner in health. Find a Primary Care Provider  Learn more about [facility]'s in-office and virtual care options:  - Get Care Now

## 2023-02-28 NOTE — Consult Note (Signed)
Subjective:   CC: Partial small bowel obstruction  HPI:  Terri Werner is a 72 y.o. female who was consulted by [facility] for issue above.  Symptoms were first noted 1 day ago. Pain is sharp, confined to the right lower quadrant, sudden onset last night with 1 episode of emesis.  Last bowel movement last night shortly before pain onset.  Patient has chronic history of constipation  Past Medical History:  has a past medical history of Arthritis, Colon polyps (2010), Diverticulitis (2016), Genital warts, GERD (gastroesophageal reflux disease), Heart murmur, Hepatitis B, History of blood transfusion, History of chicken pox, History of hiatal hernia, History of torn meniscus of right knee, HTN (hypertension), migraine headaches, Hypertension, Hypopotassemia, Pre-diabetes, and Stroke (HCC) (05/27/2021).  Past Surgical History:  Past Surgical History:  Procedure Laterality Date   ABDOMINAL HYSTERECTOMY  2006   APPENDECTOMY  1980   CESAREAN SECTION  1981   COLONOSCOPY WITH PROPOFOL N/A 11/01/2014   Procedure: COLONOSCOPY WITH PROPOFOL;  Surgeon: Scot Jun, MD;  Location: [facility] ENDOSCOPY;  Service: Endoscopy;  Laterality: N/A;   COLONOSCOPY WITH PROPOFOL N/A 01/05/2020   Procedure: COLONOSCOPY WITH PROPOFOL;  Surgeon: Regis Bill, MD;  Location: [facility] ENDOSCOPY;  Service: Endoscopy;  Laterality: N/A;   CYSTOSCOPY WITH BIOPSY N/A 02/19/2022   Procedure: CYSTOSCOPY WITH BLADDER BIOPSY;  Surgeon: Vanna Scotland, MD;  Location: [facility] ORS;  Service: Urology;  Laterality: N/A;   ECTOPIC PREGNANCY SURGERY  1980   HERNIA REPAIR  2006   Umbilical   KNEE ARTHROSCOPY Left 2006   torn meniscus   TONSILLECTOMY  1971   TOTAL ABDOMINAL HYSTERECTOMY W/ BILATERAL SALPINGOOPHORECTOMY  2006   VESICOVAGINAL FISTULA CLOSURE W/ TAH      Family History: family history includes Alzheimer's disease in her mother; Birth defects in her maternal grandmother; Breast cancer (age of onset: 75) in her  sister; Colon cancer in her maternal grandmother; Congestive Heart Failure in her father; Dementia in her mother, paternal grandfather, and paternal grandmother; Hyperlipidemia in her father and maternal grandfather; Hypertension in her maternal grandfather and mother; Thyroid disease in her sister.  Social History:  reports that she has never smoked. She has never used smokeless tobacco. She reports that she does not currently use alcohol. She reports that she does not use drugs.  Current Medications:  Prior to Admission medications   Medication Sig Start Date End Date Taking? Authorizing Provider  amLODipine (NORVASC) 5 MG tablet Take 1 tablet (5 mg total) by mouth daily. 02/14/23  Yes Margarita Mail, DO  cyclobenzaprine (FLEXERIL) 5 MG tablet Take 1 tablet (5 mg total) by mouth at bedtime as needed for muscle spasms. 02/14/23  Yes Margarita Mail, DO  lidocaine (XYLOCAINE) 5 % ointment Apply 1 Application topically as needed. 05/22/22  Yes Margarita Mail, DO  polyethylene glycol powder (GLYCOLAX/MIRALAX) 17 GM/SCOOP powder Take 17 g by mouth daily as needed for moderate constipation. 09/19/21  Yes Margarita Mail, DO  rosuvastatin (CRESTOR) 10 MG tablet Take 1 tablet (10 mg total) by mouth daily. 02/14/23  Yes Margarita Mail, DO  valsartan-hydrochlorothiazide (DIOVAN-HCT) 80-12.5 MG tablet Take 1 tablet by mouth daily. 02/14/23  Yes Margarita Mail, DO  acetaminophen (TYLENOL) 325 MG tablet Take 2 tablets (650 mg total) by mouth every 4 (four) hours as needed for mild pain (or temp > 37.5 C (99.5 F)). 06/26/21   Angiulli, Mcarthur Rossetti, PA-C  acidophilus (RISAQUAD) CAPS capsule Take 1 capsule by mouth daily. Patient not taking: Reported on 02/28/2023 06/26/21  Angiulli, Mcarthur Rossetti, PA-C  diclofenac Sodium (VOLTAREN) 1 % GEL Apply 4 g topically 4 (four) times daily. Patient not taking: Reported on 02/28/2023 06/26/21   Angiulli, Mcarthur Rossetti, PA-C  gabapentin (NEURONTIN) 300 MG capsule Take 300  mg by mouth daily. Patient not taking: Reported on 02/28/2023    [provider]  senna-docusate (SENOKOT-S) 8.6-50 MG tablet Take 1 tablet by mouth 2 (two) times daily. Patient not taking: Reported on 02/28/2023 07/18/21   Margarita Mail, DO    Allergies:  Allergies as of 02/28/2023 - Review Complete 02/28/2023  Allergen Reaction Noted   Latex Itching, Rash, and Anaphylaxis 11/18/2009   Shellfish-derived products Anaphylaxis 07/12/2022   Shrimp [shellfish allergy] Swelling 06/22/2011    ROS:  General: Denies weight loss, weight gain, fatigue, fevers, chills, and night sweats. Eyes: Denies blurry vision, double vision, eye pain, itchy eyes, and tearing. Ears: Denies hearing loss, earache, and ringing in ears. Nose: Denies sinus pain, congestion, infections, runny nose, and nosebleeds. Mouth/throat: Denies hoarseness, sore throat, bleeding gums, and difficulty swallowing. Heart: Denies chest pain, palpitations, racing heart, irregular heartbeat, leg pain or swelling, and decreased activity tolerance. Respiratory: Denies breathing difficulty, shortness of breath, wheezing, cough, and sputum. GI: Denies change in appetite, heartburn, constipation, diarrhea, and blood in stool. GU: Denies difficulty urinating, pain with urinating, urgency, frequency, blood in urine. Musculoskeletal: Denies joint stiffness, pain, swelling, muscle weakness. Skin: Denies rash, itching, mass, tumors, sores, and boils Neurologic: Denies headache, fainting, dizziness, seizures, numbness, and tingling. Psychiatric: Denies depression, anxiety, difficulty sleeping, and memory loss. Endocrine: Denies heat or cold intolerance, and increased thirst or urination. Blood/lymph: Denies easy bruising, easy bruising, and swollen glands     Objective:     BP 137/82 (BP Location: Right Arm)   Pulse 77   Temp 98.3 F (36.8 C) (Oral)   Resp 16   Ht 5\' 2"  (1.575 m)   Wt 95.6 kg   SpO2 100%   BMI 38.55  kg/m   Constitutional :  alert, cooperative, appears stated age, and no distress  Lymphatics/Throat:  no asymmetry, masses, or scars  Respiratory:  clear to auscultation bilaterally  Cardiovascular:  regular rate and rhythm  Gastrointestinal: Soft, no guarding, chronically incarcerated ventral hernia palpable.  No specific tenderness to palpation over the hernia site.  Mild tenderness to palpation right lower quadrant .   Musculoskeletal: Steady movement  Skin: Cool and moist  Psychiatric: Normal affect, non-agitated, not confused       LABS:     Latest Ref Rng & Units 02/28/2023   11:10 AM 08/21/2022    9:08 AM 02/13/2022   11:16 AM  CMP  Glucose 70 - 99 mg/dL 433  98  295   BUN 8 - 23 mg/dL 20  14  17    Creatinine 0.44 - 1.00 mg/dL 1.88  4.16  6.06   Sodium 135 - 145 mmol/L 137  141  141   Potassium 3.5 - 5.1 mmol/L 3.9  4.3  3.7   Chloride 98 - 111 mmol/L 99  103  105   CO2 22 - 32 mmol/L 29  30  29    Calcium 8.9 - 10.3 mg/dL 9.8  9.5  9.0   Total Protein 6.5 - 8.1 g/dL 8.5  7.4    Total Bilirubin 0.3 - 1.2 mg/dL 0.8  0.3    Alkaline Phos 38 - 126 U/L 50     AST 15 - 41 U/L 22  19    ALT 0 -  44 U/L 17  14        Latest Ref Rng & Units 02/28/2023   11:10 AM 08/21/2022    9:08 AM 02/13/2022   11:16 AM  CBC  WBC 4.0 - 10.5 K/uL 7.8  5.0  5.5   Hemoglobin 12.0 - 15.0 g/dL 54.0  98.1  19.1   Hematocrit 36.0 - 46.0 % 39.9  35.8  36.8   Platelets 150 - 400 K/uL 197  199  204     RADS: CLINICAL DATA:  Right lower quadrant abdominal pain since 2 a.m.   EXAM: CT ABDOMEN AND PELVIS WITH CONTRAST   TECHNIQUE: Multidetector CT imaging of the abdomen and pelvis was performed using the standard protocol following bolus administration of intravenous contrast.   RADIATION DOSE REDUCTION: This exam was performed according to the departmental dose-optimization program which includes automated exposure control, adjustment of the mA and/or kV according to patient size and/or use  of iterative reconstruction technique.   CONTRAST:  OMNIPAQUE IOHEXOL 300 MG/ML  SOLN   COMPARISON:  10/10/2021 CT abdomen/pelvis   FINDINGS: Lower chest: No significant pulmonary nodules or acute consolidative airspace disease. Coronary atherosclerosis.   Hepatobiliary: Normal liver. Subcentimeter hypodense lateral segment left liver lesion, too small to characterize, unchanged, considered benign. No new liver lesions. Normal gallbladder with no radiopaque cholelithiasis. No biliary ductal dilatation.   Pancreas: Normal, with no mass or duct dilation.   Spleen: Normal size. No mass.   Adrenals/Urinary Tract: Normal adrenals. No hydronephrosis. Scattered subcentimeter hypodense bilateral renal cortical lesions, too small to characterize, for which no follow-up imaging is recommended. Normal bladder.   Stomach/Bowel: Small hiatal hernia. Otherwise normal nondistended stomach. Several mildly dilated fluid-filled mid small bowel loops located in the central lower and right abdomen measuring up to 3.4 cm diameter. Distal small bowel is collapsed. Suggestion of a focal small bowel kink and caliber transition in the right lower quadrant (series 5/image 43). No bowel pneumatosis or definite small bowel wall thickening. Appendectomy. Cecum appears mobile and located in mid to upper right abdomen. Chronic large left periumbilical hernia containing a portion of the transverse colon. Moderate left colonic diverticulosis. No large bowel wall thickening, focal large bowel caliber transition or significant pericolonic fat stranding.   Vascular/Lymphatic: Atherosclerotic nonaneurysmal abdominal aorta. Patent portal, splenic, hepatic and renal veins. No pathologically enlarged lymph nodes in the abdomen or pelvis.   Reproductive: Status post hysterectomy, with no abnormal findings at the vaginal cuff. No adnexal mass.   Other: No pneumoperitoneum. Trace perihepatic ascites. No  focal fluid collection.   Musculoskeletal: No aggressive appearing focal osseous lesions. Moderate thoracolumbar spondylosis.   IMPRESSION: 1. CT findings are compatible with a partial mid to distal small bowel obstruction, potentially due to adhesions. No bowel pneumatosis, definite small bowel wall thickening or pneumoperitoneum. 2. Chronic large left periumbilical hernia containing a portion of the transverse colon without findings to suggest large bowel obstruction. 3. Trace perihepatic ascites. 4. Small hiatal hernia. 5. Moderate left colonic diverticulosis. 6. Coronary atherosclerosis. 7.  Aortic Atherosclerosis (ICD10-I70.0).     Electronically Signed   By: Delbert Phenix M.D.   On: 02/28/2023 14:51 Assessment:   Partial small bowel obstruction as noted above.  Plan:    Clinically stable and minimally symptomatic as of right now.  Will proceed with small bowel follow-through to ensure resolution of symptoms.  Further care of chronic issues per hospitalist in the meantime.  The patient verbalized understanding and all questions were answered to  the patient's satisfaction.  labs/images/medications/previous chart entries reviewed personally and relevant changes/updates noted above.

## 2023-02-28 NOTE — H&P (Signed)
History and Physical    Terri Werner NFA:[id number] DOB: 09/13/1950 DOA: 02/28/2023  PCP: Margarita Mail, DO  Patient coming from: Home  I have personally briefly reviewed patient's old medical records in [facility]  Chief Complaint: Abdominal pain, intractable nausea and vomiting  HPI: Terri Werner is a 72 y.o. female with medical history significant of hypertension, history of CVA who presents to the ED for evaluation of abdominal pain associated with intractable nausea and vomiting.  Started 1 day prior to admission.  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  No recent changes in diet.  No recent travel.  Pain occurred acutely, described as sharp in nature, right lower quadrant.  Vital signs stable.  CAT scan revealed partial mid to distal small bowel obstruction with evidence of paraesophageal hernia.  Surgery consulted by EDP.  NG tube deferred at this time as patient is not nauseous nor vomiting and appears comfortable.  Hospice contacted for admission.  ED Course: CAT scan revealed above findings.  Partial small bowel obstruction.  Surgery consulted by EDP.  Hospitalist contacted for admission.  Patient received narcotic and antiemetic for symptom control  Review of Systems: As per HPI otherwise 14 point review of systems negative.    Past Medical History:  Diagnosis Date   Arthritis    pt reports in back & knees   Colon polyps 2010   At [facility]   Diverticulitis 2016   Genital warts    GERD (gastroesophageal reflux disease)    Heart murmur    Hepatitis B    History of blood transfusion    during each major surgery per pt   History of chicken pox    History of hiatal hernia    History of torn meniscus of right knee    HTN (hypertension)    Hx of migraine headaches    Hypertension    Hypopotassemia    Pre-diabetes    Stroke  (HCC) 05/27/2021    Past Surgical History:  Procedure Laterality Date   ABDOMINAL HYSTERECTOMY  2006   APPENDECTOMY  1980   CESAREAN SECTION  1981   COLONOSCOPY WITH PROPOFOL N/A 11/01/2014   Procedure: COLONOSCOPY WITH PROPOFOL;  Surgeon: Scot Jun, MD;  Location: [facility] ENDOSCOPY;  Service: Endoscopy;  Laterality: N/A;   COLONOSCOPY WITH PROPOFOL N/A 01/05/2020   Procedure: COLONOSCOPY WITH PROPOFOL;  Surgeon: Regis Bill, MD;  Location: [facility] ENDOSCOPY;  Service: Endoscopy;  Laterality: N/A;   CYSTOSCOPY WITH BIOPSY N/A 02/19/2022   Procedure: CYSTOSCOPY WITH BLADDER BIOPSY;  Surgeon: Vanna Scotland, MD;  Location: [facility] ORS;  Service: Urology;  Laterality: N/A;   ECTOPIC PREGNANCY SURGERY  1980   HERNIA REPAIR  2006   Umbilical   KNEE ARTHROSCOPY Left 2006   torn meniscus   TONSILLECTOMY  1971   TOTAL ABDOMINAL HYSTERECTOMY W/ BILATERAL SALPINGOOPHORECTOMY  2006   VESICOVAGINAL FISTULA CLOSURE W/ TAH       reports that she has never smoked. She has never used smokeless tobacco. She reports that she does not currently use alcohol. She reports that she does not use drugs.  Allergies  Allergen Reactions   Latex Itching, Rash and Anaphylaxis    Burning   Shellfish-Derived Products Anaphylaxis   Shrimp [Shellfish Allergy] Swelling    Lips will burn     Family History  Problem Relation Age of Onset   Breast cancer Sister 5  Hypertension Mother    Alzheimer's disease Mother    Dementia Mother    Hypertension Maternal Grandfather    Hyperlipidemia Maternal Grandfather    Birth defects Maternal Grandmother        Bladder   Colon cancer Maternal Grandmother    Congestive Heart Failure Father    Hyperlipidemia Father    Dementia Paternal Grandmother    Dementia Paternal Grandfather    Thyroid disease Sister     Prior to Admission medications   Medication Sig Start Date End Date Taking? Authorizing Provider  acetaminophen (TYLENOL) 325 MG tablet Take 2  tablets (650 mg total) by mouth every 4 (four) hours as needed for mild pain (or temp > 37.5 C (99.5 F)). 06/26/21   Angiulli, Mcarthur Rossetti, PA-C  acidophilus (RISAQUAD) CAPS capsule Take 1 capsule by mouth daily. 06/26/21   Angiulli, Mcarthur Rossetti, PA-C  amLODipine (NORVASC) 5 MG tablet Take 1 tablet (5 mg total) by mouth daily. 02/14/23   Margarita Mail, DO  cyclobenzaprine (FLEXERIL) 5 MG tablet Take 1 tablet (5 mg total) by mouth at bedtime as needed for muscle spasms. 02/14/23   Margarita Mail, DO  diclofenac Sodium (VOLTAREN) 1 % GEL Apply 4 g topically 4 (four) times daily. 06/26/21   Angiulli, Mcarthur Rossetti, PA-C  gabapentin (NEURONTIN) 300 MG capsule Take 300 mg by mouth daily.    [provider]  lidocaine (XYLOCAINE) 5 % ointment Apply 1 Application topically as needed. 05/22/22   Margarita Mail, DO  polyethylene glycol powder (GLYCOLAX/MIRALAX) 17 GM/SCOOP powder Take 17 g by mouth daily as needed for moderate constipation. 09/19/21   Margarita Mail, DO  rosuvastatin (CRESTOR) 10 MG tablet Take 1 tablet (10 mg total) by mouth daily. 02/14/23   Margarita Mail, DO  senna-docusate (SENOKOT-S) 8.6-50 MG tablet Take 1 tablet by mouth 2 (two) times daily. 07/18/21   Margarita Mail, DO  valsartan-hydrochlorothiazide (DIOVAN-HCT) 80-12.5 MG tablet Take 1 tablet by mouth daily. 02/14/23   Margarita Mail, DO    Physical Exam: Vitals:   02/28/23 1107 02/28/23 1117  BP: 137/82   Pulse: 77   Resp: 16   Temp: 98.3 F (36.8 C)   TempSrc: Oral   SpO2: 100%   Weight:  95.6 kg  Height:  5\' 2"  (1.575 m)    Vitals:   02/28/23 1107 02/28/23 1117  BP: 137/82   Pulse: 77   Resp: 16   Temp: 98.3 F (36.8 C)   TempSrc: Oral   SpO2: 100%   Weight:  95.6 kg  Height:  5\' 2"  (1.575 m)   General: No apparent distress, patient appears well HEENT: Normocephalic, atraumatic Neck, supple, trachea midline, no tenderness Heart: Regular rate and rhythm, S1/S2 normal, no murmurs Lungs:  Lungs clear.  Work of breathing.  Room air Abdomen: Soft, nondistended, right lower quadrant tenderness, decreased bowel sounds Extremities: Normal, atraumatic, no clubbing or cyanosis, normal muscle tone Skin: No rashes or lesions, normal color Neurologic: Cranial nerves grossly intact, sensation intact, alert and oriented x3 Psychiatric: Normal affect   Labs on Admission: I have personally reviewed following labs and imaging studies  CBC: Recent Labs  Lab 02/28/23 1110  WBC 7.8  HGB 13.2  HCT 39.9  MCV 86.6  PLT 197   Basic Metabolic Panel: Recent Labs  Lab 02/28/23 1110  NA 137  K 3.9  CL 99  CO2 29  GLUCOSE 147*  BUN 20  CREATININE 1.01*  CALCIUM 9.8   GFR: Estimated Creatinine Clearance: 55.1  mL/min (A) (by C-G formula based on SCr of 1.01 mg/dL (H)). Liver Function Tests: Recent Labs  Lab 02/28/23 1110  AST 22  ALT 17  ALKPHOS 50  BILITOT 0.8  PROT 8.5*  ALBUMIN 4.4   Recent Labs  Lab 02/28/23 1110  LIPASE 32   No results for input(s): "AMMONIA" in the last 168 hours. Coagulation Profile: No results for input(s): "INR", "PROTIME" in the last 168 hours. Cardiac Enzymes: No results for input(s): "CKTOTAL", "CKMB", "CKMBINDEX", "TROPONINI" in the last 168 hours. BNP (last 3 results) No results for input(s): "PROBNP" in the last 8760 hours. HbA1C: No results for input(s): "HGBA1C" in the last 72 hours. CBG: No results for input(s): "GLUCAP" in the last 168 hours. Lipid Profile: No results for input(s): "CHOL", "HDL", "LDLCALC", "TRIG", "CHOLHDL", "LDLDIRECT" in the last 72 hours. Thyroid Function Tests: No results for input(s): "TSH", "T4TOTAL", "FREET4", "T3FREE", "THYROIDAB" in the last 72 hours. Anemia Panel: No results for input(s): "VITAMINB12", "FOLATE", "FERRITIN", "TIBC", "IRON", "RETICCTPCT" in the last 72 hours. Urine analysis:    Component Value Date/Time   COLORURINE YELLOW 05/28/2021 1908   APPEARANCEUR Clear 01/16/2023 1331    LABSPEC 1.025 05/28/2021 1908   PHURINE 5.5 05/28/2021 1908   GLUCOSEU Negative 01/16/2023 1331   HGBUR SMALL (A) 05/28/2021 1908   BILIRUBINUR Negative 01/16/2023 1331   KETONESUR NEGATIVE 05/28/2021 1908   PROTEINUR Negative 01/16/2023 1331   PROTEINUR 30 (A) 05/28/2021 1908   UROBILINOGEN 0.2 02/27/2021 1001   NITRITE Negative 01/16/2023 1331   NITRITE POSITIVE (A) 05/28/2021 1908   LEUKOCYTESUR Trace (A) 01/16/2023 1331   LEUKOCYTESUR LARGE (A) 05/28/2021 1908    Radiological Exams on Admission: CT ABDOMEN PELVIS W CONTRAST  Result Date: 02/28/2023 CLINICAL DATA:  Right lower quadrant abdominal pain since 2 a.m. EXAM: CT ABDOMEN AND PELVIS WITH CONTRAST TECHNIQUE: Multidetector CT imaging of the abdomen and pelvis was performed using the standard protocol following bolus administration of intravenous contrast. RADIATION DOSE REDUCTION: This exam was performed according to the departmental dose-optimization program which includes automated exposure control, adjustment of the mA and/or kV according to patient size and/or use of iterative reconstruction technique. CONTRAST:  OMNIPAQUE IOHEXOL 300 MG/ML  SOLN COMPARISON:  10/10/2021 CT abdomen/pelvis FINDINGS: Lower chest: No significant pulmonary nodules or acute consolidative airspace disease. Coronary atherosclerosis. Hepatobiliary: Normal liver. Subcentimeter hypodense lateral segment left liver lesion, too small to characterize, unchanged, considered benign. No new liver lesions. Normal gallbladder with no radiopaque cholelithiasis. No biliary ductal dilatation. Pancreas: Normal, with no mass or duct dilation. Spleen: Normal size. No mass. Adrenals/Urinary Tract: Normal adrenals. No hydronephrosis. Scattered subcentimeter hypodense bilateral renal cortical lesions, too small to characterize, for which no follow-up imaging is recommended. Normal bladder. Stomach/Bowel: Small hiatal hernia. Otherwise normal nondistended stomach. Several  mildly dilated fluid-filled mid small bowel loops located in the central lower and right abdomen measuring up to 3.4 cm diameter. Distal small bowel is collapsed. Suggestion of a focal small bowel kink and caliber transition in the right lower quadrant (series 5/image 43). No bowel pneumatosis or definite small bowel wall thickening. Appendectomy. Cecum appears mobile and located in mid to upper right abdomen. Chronic large left periumbilical hernia containing a portion of the transverse colon. Moderate left colonic diverticulosis. No large bowel wall thickening, focal large bowel caliber transition or significant pericolonic fat stranding. Vascular/Lymphatic: Atherosclerotic nonaneurysmal abdominal aorta. Patent portal, splenic, hepatic and renal veins. No pathologically enlarged lymph nodes in the abdomen or pelvis. Reproductive: Status post hysterectomy,  with no abnormal findings at the vaginal cuff. No adnexal mass. Other: No pneumoperitoneum. Trace perihepatic ascites. No focal fluid collection. Musculoskeletal: No aggressive appearing focal osseous lesions. Moderate thoracolumbar spondylosis. IMPRESSION: 1. CT findings are compatible with a partial mid to distal small bowel obstruction, potentially due to adhesions. No bowel pneumatosis, definite small bowel wall thickening or pneumoperitoneum. 2. Chronic large left periumbilical hernia containing a portion of the transverse colon without findings to suggest large bowel obstruction. 3. Trace perihepatic ascites. 4. Small hiatal hernia. 5. Moderate left colonic diverticulosis. 6. Coronary atherosclerosis. 7.  Aortic Atherosclerosis (ICD10-I70.0). Electronically Signed   By: Delbert Phenix M.D.   On: 02/28/2023 14:51    EKG:  Not available at time of this note  Assessment/Plan Principal Problem:   Partial bowel obstruction (HCC)  Partial mid to distal small bowel obstruction Possibly secondary to intra-abdominal adhesions Patient relatively comfortable.   Not nauseous or vomiting.  Belly nonperitoneal neck Plan: Place in observation General Surgery consult, Dr. Tonna Boehringer Small bowel follow-through N.p.o. IVF Pain control Antiemetics  History of CVA No acute issues  Essential hypertension Continue home amlodipine 5 mg daily Hold Diovan HCT for now  History of colovesicular fistula Outpatient urology follow-up   DVT prophylaxis: SQ Lovenox Code Status: Full Family Communication: None today Disposition Plan: Return to previous home environment Consults called: General surgeryTonna Boehringer Admission status: Observation, MedSurg   Tresa Moore MD [facility]   If 7PM-7AM, please contact night-coverage   02/28/2023, 3:40 PM

## 2023-02-28 NOTE — ED Triage Notes (Signed)
Pt c/o RLQ abd pain with N/V that started this morning.

## 2023-03-01 ENCOUNTER — Inpatient Hospital Stay: Payer: Medicare Other | Admitting: Certified Registered"

## 2023-03-01 ENCOUNTER — Other Ambulatory Visit: Payer: Self-pay

## 2023-03-01 ENCOUNTER — Encounter
Admission: EM | Disposition: A | Discharge status: Home / Self Care | Payer: Self-pay | Source: Home / Self Care | Attending: Internal Medicine

## 2023-03-01 ENCOUNTER — Encounter: Payer: Self-pay | Admitting: Internal Medicine

## 2023-03-01 ENCOUNTER — Observation Stay: Payer: Medicare Other

## 2023-03-01 DIAGNOSIS — K5651 Intestinal adhesions [bands], with partial obstruction: Secondary | ICD-10-CM | POA: Diagnosis present

## 2023-03-01 DIAGNOSIS — K56609 Unspecified intestinal obstruction, unspecified as to partial versus complete obstruction: Secondary | ICD-10-CM | POA: Diagnosis present

## 2023-03-01 DIAGNOSIS — Z91013 Allergy to seafood: Secondary | ICD-10-CM | POA: Diagnosis not present

## 2023-03-01 DIAGNOSIS — Z91040 Latex allergy status: Secondary | ICD-10-CM | POA: Diagnosis not present

## 2023-03-01 DIAGNOSIS — Z90710 Acquired absence of both cervix and uterus: Secondary | ICD-10-CM | POA: Diagnosis not present

## 2023-03-01 DIAGNOSIS — R7303 Prediabetes: Secondary | ICD-10-CM | POA: Diagnosis present

## 2023-03-01 DIAGNOSIS — Z800 Family history of malignant neoplasm of digestive organs: Secondary | ICD-10-CM | POA: Diagnosis not present

## 2023-03-01 DIAGNOSIS — K432 Incisional hernia without obstruction or gangrene: Secondary | ICD-10-CM | POA: Diagnosis present

## 2023-03-01 DIAGNOSIS — Z90722 Acquired absence of ovaries, bilateral: Secondary | ICD-10-CM | POA: Diagnosis not present

## 2023-03-01 DIAGNOSIS — K430 Incisional hernia with obstruction, without gangrene: Secondary | ICD-10-CM | POA: Diagnosis present

## 2023-03-01 DIAGNOSIS — I10 Essential (primary) hypertension: Secondary | ICD-10-CM | POA: Diagnosis present

## 2023-03-01 DIAGNOSIS — Z8673 Personal history of transient ischemic attack (TIA), and cerebral infarction without residual deficits: Secondary | ICD-10-CM | POA: Diagnosis not present

## 2023-03-01 DIAGNOSIS — Z803 Family history of malignant neoplasm of breast: Secondary | ICD-10-CM | POA: Diagnosis not present

## 2023-03-01 DIAGNOSIS — Z83438 Family history of other disorder of lipoprotein metabolism and other lipidemia: Secondary | ICD-10-CM | POA: Diagnosis not present

## 2023-03-01 DIAGNOSIS — Z820 Family history of epilepsy and other diseases of the nervous system: Secondary | ICD-10-CM | POA: Diagnosis not present

## 2023-03-01 DIAGNOSIS — Z79899 Other long term (current) drug therapy: Secondary | ICD-10-CM | POA: Diagnosis not present

## 2023-03-01 DIAGNOSIS — K5909 Other constipation: Secondary | ICD-10-CM | POA: Diagnosis present

## 2023-03-01 DIAGNOSIS — Z8349 Family history of other endocrine, nutritional and metabolic diseases: Secondary | ICD-10-CM | POA: Diagnosis not present

## 2023-03-01 DIAGNOSIS — K219 Gastro-esophageal reflux disease without esophagitis: Secondary | ICD-10-CM | POA: Diagnosis present

## 2023-03-01 DIAGNOSIS — Z8249 Family history of ischemic heart disease and other diseases of the circulatory system: Secondary | ICD-10-CM | POA: Diagnosis not present

## 2023-03-01 HISTORY — PX: INCISIONAL HERNIA REPAIR: SHX193

## 2023-03-01 HISTORY — PX: LAPAROTOMY: SHX154

## 2023-03-01 HISTORY — PX: LYSIS OF ADHESION: SHX5961

## 2023-03-01 LAB — CBC
HCT: 36.5 % (ref 36.0–46.0)
Hemoglobin: 12.2 g/dL (ref 12.0–15.0)
MCH: 29 pg (ref 26.0–34.0)
MCHC: 33.4 g/dL (ref 30.0–36.0)
MCV: 86.7 fL (ref 80.0–100.0)
Platelets: 188 10*3/uL (ref 150–400)
RBC: 4.21 MIL/uL (ref 3.87–5.11)
RDW: 15 % (ref 11.5–15.5)
WBC: 7 10*3/uL (ref 4.0–10.5)
nRBC: 0 % (ref 0.0–0.2)

## 2023-03-01 LAB — BASIC METABOLIC PANEL
Anion gap: 10 (ref 5–15)
BUN: 17 mg/dL (ref 8–23)
CO2: 29 mmol/L (ref 22–32)
Calcium: 9.2 mg/dL (ref 8.9–10.3)
Chloride: 100 mmol/L (ref 98–111)
Creatinine, Ser: 0.98 mg/dL (ref 0.44–1.00)
GFR, Estimated: >60 mL/min (ref >60)
Glucose, Bld: 154 mg/dL — ABNORMAL HIGH (ref 70–99)
Potassium: 3.9 mmol/L (ref 3.5–5.1)
Sodium: 139 mmol/L (ref 135–145)

## 2023-03-01 SURGERY — LAPAROTOMY, EXPLORATORY
Anesthesia: General | Site: Abdomen

## 2023-03-01 MED ORDER — SEVOFLURANE IN SOLN
RESPIRATORY_TRACT | Status: AC
  Filled 2023-03-01: qty 250

## 2023-03-01 MED ORDER — BUPIVACAINE-MELOXICAM ER 200-6 MG/7ML IJ SOLN
INTRAMUSCULAR | Status: AC
  Filled 2023-03-01: qty 1

## 2023-03-01 MED ORDER — PROPOFOL 10 MG/ML IV BOLUS
INTRAVENOUS | Status: AC
  Filled 2023-03-01: qty 20

## 2023-03-01 MED ORDER — LIDOCAINE HCL (CARDIAC) PF 100 MG/5ML IV SOSY
PREFILLED_SYRINGE | INTRAVENOUS | Status: DC | PRN
  Administered 2023-03-01: 100 mg via INTRAVENOUS

## 2023-03-01 MED ORDER — CEFAZOLIN SODIUM-DEXTROSE 2-4 GM/100ML-% IV SOLN
2.0000 g | Freq: Once | INTRAVENOUS | Status: AC
  Administered 2023-03-01: 2 g via INTRAVENOUS

## 2023-03-01 MED ORDER — DROPERIDOL 2.5 MG/ML IJ SOLN: 0.6250 mg | Freq: Once | INTRAMUSCULAR | Status: DC | PRN

## 2023-03-01 MED ORDER — FENTANYL CITRATE (PF) 100 MCG/2ML IJ SOLN
INTRAMUSCULAR | Status: DC | PRN
  Administered 2023-03-01 (×2): 50 ug via INTRAVENOUS

## 2023-03-01 MED ORDER — ONDANSETRON HCL 4 MG/2ML IJ SOLN
INTRAMUSCULAR | Status: DC | PRN
  Administered 2023-03-01: 4 mg via INTRAVENOUS

## 2023-03-01 MED ORDER — PANTOPRAZOLE SODIUM 40 MG IV SOLR
40.0000 mg | Freq: Every day | INTRAVENOUS | Status: DC
  Administered 2023-03-01 – 2023-03-02 (×2): 40 mg via INTRAVENOUS
  Filled 2023-03-01 (×2): qty 10

## 2023-03-01 MED ORDER — ROCURONIUM BROMIDE 10 MG/ML (PF) SYRINGE
PREFILLED_SYRINGE | INTRAVENOUS | Status: AC
  Filled 2023-03-01: qty 10

## 2023-03-01 MED ORDER — LACTATED RINGERS IV SOLN: INTRAVENOUS | Status: DC | PRN

## 2023-03-01 MED ORDER — SUGAMMADEX SODIUM 200 MG/2ML IV SOLN
INTRAVENOUS | Status: DC | PRN
  Administered 2023-03-01: 200 mg via INTRAVENOUS
  Administered 2023-03-01: 100 mg via INTRAVENOUS

## 2023-03-01 MED ORDER — PHENYLEPHRINE 80 MCG/ML (10ML) SYRINGE FOR IV PUSH (FOR BLOOD PRESSURE SUPPORT)
PREFILLED_SYRINGE | INTRAVENOUS | Status: DC | PRN
  Administered 2023-03-01 (×2): 160 ug via INTRAVENOUS

## 2023-03-01 MED ORDER — GABAPENTIN 100 MG PO CAPS
200.0000 mg | ORAL_CAPSULE | Freq: Two times a day (BID) | ORAL | Status: DC
  Administered 2023-03-01 – 2023-03-06 (×10): 200 mg via ORAL
  Filled 2023-03-01 (×10): qty 2

## 2023-03-01 MED ORDER — ACETAMINOPHEN 10 MG/ML IV SOLN
INTRAVENOUS | Status: AC
  Filled 2023-03-01: qty 100

## 2023-03-01 MED ORDER — MIDAZOLAM HCL 2 MG/2ML IJ SOLN
INTRAMUSCULAR | Status: AC
  Filled 2023-03-01: qty 2

## 2023-03-01 MED ORDER — ORAL CARE MOUTH RINSE: 15.0000 mL | Freq: Once | OROMUCOSAL | Status: AC

## 2023-03-01 MED ORDER — 0.9 % SODIUM CHLORIDE (POUR BTL) OPTIME
TOPICAL | Status: DC | PRN
  Administered 2023-03-01: 500 mL

## 2023-03-01 MED ORDER — FENTANYL CITRATE (PF) 100 MCG/2ML IJ SOLN
INTRAMUSCULAR | Status: AC
  Filled 2023-03-01: qty 2

## 2023-03-01 MED ORDER — ROCURONIUM BROMIDE 100 MG/10ML IV SOLN
INTRAVENOUS | Status: DC | PRN
  Administered 2023-03-01: 50 mg via INTRAVENOUS

## 2023-03-01 MED ORDER — CELECOXIB 200 MG PO CAPS
200.0000 mg | ORAL_CAPSULE | Freq: Every day | ORAL | Status: DC
  Administered 2023-03-01 – 2023-03-06 (×6): 200 mg via ORAL
  Filled 2023-03-01 (×6): qty 1

## 2023-03-01 MED ORDER — MIDAZOLAM HCL 2 MG/2ML IJ SOLN
INTRAMUSCULAR | Status: DC | PRN
  Administered 2023-03-01 (×2): 1 mg via INTRAVENOUS

## 2023-03-01 MED ORDER — CHLORHEXIDINE GLUCONATE 0.12 % MT SOLN
15.0000 mL | Freq: Once | OROMUCOSAL | Status: AC
  Administered 2023-03-01: 15 mL via OROMUCOSAL

## 2023-03-01 MED ORDER — PROPOFOL 10 MG/ML IV BOLUS
INTRAVENOUS | Status: DC | PRN
  Administered 2023-03-01: 150 mg via INTRAVENOUS

## 2023-03-01 MED ORDER — DEXAMETHASONE SODIUM PHOSPHATE 10 MG/ML IJ SOLN
INTRAMUSCULAR | Status: DC | PRN
  Administered 2023-03-01: 10 mg via INTRAVENOUS

## 2023-03-01 MED ORDER — FENTANYL CITRATE (PF) 100 MCG/2ML IJ SOLN
25.0000 ug | INTRAMUSCULAR | Status: DC | PRN
  Administered 2023-03-01 (×2): 25 ug via INTRAVENOUS
  Administered 2023-03-01: 50 ug via INTRAVENOUS

## 2023-03-01 MED ORDER — BUPIVACAINE-MELOXICAM ER 200-6 MG/7ML IJ SOLN
INTRAMUSCULAR | Status: DC | PRN
  Administered 2023-03-01: 7 mL

## 2023-03-01 MED ORDER — SUCCINYLCHOLINE CHLORIDE 200 MG/10ML IV SOSY
PREFILLED_SYRINGE | INTRAVENOUS | Status: DC | PRN
  Administered 2023-03-01: 120 mg via INTRAVENOUS

## 2023-03-01 MED ORDER — ACETAMINOPHEN 10 MG/ML IV SOLN
INTRAVENOUS | Status: DC | PRN
  Administered 2023-03-01: 1000 mg via INTRAVENOUS

## 2023-03-01 MED ORDER — CEFAZOLIN SODIUM-DEXTROSE 2-4 GM/100ML-% IV SOLN
INTRAVENOUS | Status: AC
  Filled 2023-03-01: qty 100

## 2023-03-01 MED ORDER — BUPIVACAINE LIPOSOME 1.3 % IJ SUSP
INTRAMUSCULAR | Status: AC
  Filled 2023-03-01: qty 20

## 2023-03-01 MED ORDER — CHLORHEXIDINE GLUCONATE 0.12 % MT SOLN
OROMUCOSAL | Status: AC
  Filled 2023-03-01: qty 15

## 2023-03-01 SURGICAL SUPPLY — 45 items
APL PRP STRL LF DISP 70% ISPRP (MISCELLANEOUS)
BARRIER ADHS 3X4 INTERCEED (GAUZE/BANDAGES/DRESSINGS) IMPLANT
BRR ADH 4X3 ABS CNTRL BYND (GAUZE/BANDAGES/DRESSINGS) ×1
CHLORAPREP W/TINT 26 (MISCELLANEOUS) ×1 IMPLANT
DRAPE LAPAROTOMY 100X77 ABD (DRAPES) ×1 IMPLANT
DRSG OPSITE POSTOP 4X10 (GAUZE/BANDAGES/DRESSINGS) IMPLANT
DRSG OPSITE POSTOP 4X8 (GAUZE/BANDAGES/DRESSINGS) IMPLANT
ELECT BLADE 6.5 EXT (BLADE) IMPLANT
ELECT CAUTERY BLADE 6.4 (BLADE) ×1 IMPLANT
ELECT REM PT RETURN 9FT ADLT (ELECTROSURGICAL) ×1
ELECTRODE REM PT RTRN 9FT ADLT (ELECTROSURGICAL) ×1 IMPLANT
GAUZE 4X4 16PLY ~~LOC~~+RFID DBL (SPONGE) IMPLANT
GLOVE BIOGEL PI IND STRL 7.0 (GLOVE) ×1 IMPLANT
GLOVE SURG SYN 6.5 ES PF (GLOVE) ×1 IMPLANT
GLOVE SURG SYN 6.5 PF PI (GLOVE) ×1 IMPLANT
GOWN STRL REUS W/ TWL LRG LVL3 (GOWN DISPOSABLE) ×2 IMPLANT
GOWN STRL REUS W/TWL LRG LVL3 (GOWN DISPOSABLE) ×2
KIT PREVENA INCISION MGT20CM45 (CANNISTER) IMPLANT
KIT TURNOVER KIT A (KITS) ×1 IMPLANT
LABEL OR SOLS (LABEL) ×1 IMPLANT
LIGASURE IMPACT 36 18CM CVD LR (INSTRUMENTS) IMPLANT
MANIFOLD NEPTUNE II (INSTRUMENTS) ×1 IMPLANT
NDL HYPO 22X1.5 SAFETY MO (MISCELLANEOUS) IMPLANT
NEEDLE HYPO 22X1.5 SAFETY MO (MISCELLANEOUS) IMPLANT
NS IRRIG 1000ML POUR BTL (IV SOLUTION) ×1 IMPLANT
PACK BASIN MAJOR ARMC (MISCELLANEOUS) ×1 IMPLANT
PACK COLON CLEAN CLOSURE (MISCELLANEOUS) IMPLANT
RELOAD PROXIMATE 75MM BLUE (ENDOMECHANICALS) IMPLANT
RELOAD STAPLE 75 3.8 BLU REG (ENDOMECHANICALS) IMPLANT
SPONGE T-LAP 18X18 ~~LOC~~+RFID (SPONGE) IMPLANT
STAPLER PROXIMATE 75MM BLUE (STAPLE) IMPLANT
STAPLER SKIN PROX 35W (STAPLE) ×1 IMPLANT
SUT PDS AB 1 TP1 54 (SUTURE) ×1 IMPLANT
SUT SILK 2 0 (SUTURE)
SUT SILK 2-0 18XBRD TIE 12 (SUTURE) IMPLANT
SUT SILK 3 0 (SUTURE) ×1
SUT SILK 3-0 (SUTURE) IMPLANT
SUT SILK 3-0 18XBRD TIE 12 (SUTURE) IMPLANT
SUT VIC AB 3-0 SH 27 (SUTURE)
SUT VIC AB 3-0 SH 27X BRD (SUTURE) IMPLANT
SYR 20ML LL LF (SYRINGE) IMPLANT
TRAP FLUID SMOKE EVACUATOR (MISCELLANEOUS) ×1 IMPLANT
TRAY FOLEY MTR SLVR 16FR STAT (SET/KITS/TRAYS/PACK) ×1 IMPLANT
TRAY FOLEY SLVR 16FR LF STAT (SET/KITS/TRAYS/PACK) IMPLANT
WATER STERILE IRR 500ML POUR (IV SOLUTION) ×1 IMPLANT

## 2023-03-01 NOTE — Plan of Care (Signed)
[facility] Tonsillectomy/Adenoidectomy, Postoperative PEDS care plan entered in error.

## 2023-03-01 NOTE — Anesthesia Preprocedure Evaluation (Signed)
Anesthesia Evaluation  Patient identified by MRN, date of birth, ID band Patient awake    Reviewed: Allergy & Precautions, NPO status , Patient's Chart, lab work & pertinent test results  History of Anesthesia Complications Negative for: history of anesthetic complications  Airway Mallampati: III  TM Distance: >3 FB Neck ROM: full    Dental  (+) Chipped, Dental Advidsory Given, Missing, Teeth Intact   Pulmonary neg pulmonary ROS, neg shortness of breath   Pulmonary exam normal        Cardiovascular Exercise Tolerance: Good hypertension, (-) angina (-) Past MI and (-) Cardiac Stents Normal cardiovascular exam(-) dysrhythmias + Valvular Problems/Murmurs      Neuro/Psych neg Seizures CVA, Residual Symptoms  negative psych ROS   GI/Hepatic hiatal hernia,GERD  Controlled,,(+) Hepatitis -  Endo/Other  negative endocrine ROS    Renal/GU      Musculoskeletal   Abdominal   Peds  Hematology negative hematology ROS (+)   Anesthesia Other Findings Past Medical History: No date: Arthritis     Comment:  pt reports in back & knees 2010: Colon polyps     Comment:  At [facility] 2016: Diverticulitis No date: Genital warts No date: GERD (gastroesophageal reflux disease) No date: Heart murmur No date: Hepatitis B No date: History of blood transfusion     Comment:  during each major surgery per pt No date: History of chicken pox No date: History of hiatal hernia No date: History of torn meniscus of right knee No date: HTN (hypertension) No date: Hx of migraine headaches No date: Hypertension No date: Hypopotassemia No date: Pre-diabetes 05/27/2021: Stroke [facility])  Past Surgical History: 2006: ABDOMINAL HYSTERECTOMY 1980: APPENDECTOMY 1981: CESAREAN SECTION 11/01/2014: COLONOSCOPY WITH PROPOFOL; N/A     Comment:  Procedure: COLONOSCOPY WITH PROPOFOL;  Surgeon: Scot Jun, MD;  Location: [facility] ENDOSCOPY;   Service:               Endoscopy;  Laterality: N/A; 01/05/2020: COLONOSCOPY WITH PROPOFOL; N/A     Comment:  Procedure: COLONOSCOPY WITH PROPOFOL;  Surgeon:               Regis Bill, MD;  Location: [facility] ENDOSCOPY;                Service: Endoscopy;  Laterality: N/A; 1980: ECTOPIC PREGNANCY SURGERY 2006: HERNIA REPAIR     Comment:  Umbilical 2006: KNEE ARTHROSCOPY; Left     Comment:  torn meniscus 1971: TONSILLECTOMY 2006: TOTAL ABDOMINAL HYSTERECTOMY W/ BILATERAL SALPINGOOPHORECTOMY No date: VESICOVAGINAL FISTULA CLOSURE W/ TAH  BMI    Body Mass Index: 39.96 kg/m      Reproductive/Obstetrics negative OB ROS                             Anesthesia Physical Anesthesia Plan  ASA: 3  Anesthesia Plan: General   Post-op Pain Management:    Induction: Intravenous, Rapid sequence and Cricoid pressure planned  PONV Risk Score and Plan: 3 and Dexamethasone, Ondansetron, Midazolam and Treatment may vary due to age or medical condition  Airway Management Planned: Oral ETT  Additional Equipment:   Intra-op Plan:   Post-operative Plan: Extubation in OR  Informed Consent: I have reviewed the patients History and Physical, chart, labs and discussed the procedure including the risks, benefits and alternatives for the proposed anesthesia with the patient or authorized representative who has  indicated his/her understanding and acceptance.     Dental Advisory Given  Plan Discussed with: Anesthesiologist, CRNA and Surgeon  Anesthesia Plan Comments: (Patient consented for risks of anesthesia including but not limited to:  - adverse reactions to medications - damage to eyes, teeth, lips or other oral mucosa - nerve damage due to positioning  - sore throat or hoarseness - Damage to heart, brain, nerves, lungs, other parts of body or loss of life  Patient voiced understanding.)        Anesthesia Quick Evaluation

## 2023-03-01 NOTE — Progress Notes (Signed)
Subjective:  CC: Terri Werner is a 72 y.o. female  Hospital stay day 0,    SBO  HPI: No change.  Still has pain in RLQ.  Still having episodes of emesis  ROS:  General: Denies weight loss, weight gain, fatigue, fevers, chills, and night sweats. Heart: Denies chest pain, palpitations, racing heart, irregular heartbeat, leg pain or swelling, and decreased activity tolerance. Respiratory: Denies breathing difficulty, shortness of breath, wheezing, cough, and sputum. GI: Denies change in appetite, heartburn, nausea, vomiting, constipation, diarrhea, and blood in stool. GU: Denies difficulty urinating, pain with urinating, urgency, frequency, blood in urine.   Objective:   Temp:  [97.9 F (36.6 C)-99.1 F (37.3 C)] 98.8 F (37.1 C) (10/18 0816) Pulse Rate:  [68-84] 80 (10/18 0816) Resp:  [16-18] 16 (10/18 0816) BP: (122-154)/(71-87) 122/82 (10/18 0816) SpO2:  [96 %-100 %] 96 % (10/18 0816) Weight:  [95.6 kg] 95.6 kg (10/17 1117)     Height: 5\' 2"  (157.5 cm) Weight: 95.6 kg BMI (Calculated): 38.54   Intake/Output this shift:   Intake/Output Summary (Last 24 hours) at 03/01/2023 1036 Last data filed at 03/01/2023 0700 Gross per 24 hour  Intake 900.36 ml  Output 400 ml  Net 500.36 ml    Constitutional :  alert, cooperative, appears stated age, and no distress  Respiratory:  clear to auscultation bilaterally  Cardiovascular:  regular rate and rhythm  Gastrointestinal: Soft, no guarding, focal TTP RLQ, over area noted on CT .   Skin: Cool and moist.   Psychiatric: Normal affect, non-agitated, not confused       LABS:     Latest Ref Rng & Units 03/01/2023    6:21 AM 02/28/2023   11:10 AM 08/21/2022    9:08 AM  CMP  Glucose 70 - 99 mg/dL 034  742  98   BUN 8 - 23 mg/dL 17  20  14    Creatinine 0.44 - 1.00 mg/dL 5.95  6.38  7.56   Sodium 135 - 145 mmol/L 139  137  141   Potassium 3.5 - 5.1 mmol/L 3.9  3.9  4.3   Chloride 98 - 111 mmol/L 100  99  103   CO2 22 -  32 mmol/L 29  29  30    Calcium 8.9 - 10.3 mg/dL 9.2  9.8  9.5   Total Protein 6.5 - 8.1 g/dL  8.5  7.4   Total Bilirubin 0.3 - 1.2 mg/dL  0.8  0.3   Alkaline Phos 38 - 126 U/L  50    AST 15 - 41 U/L  22  19   ALT 0 - 44 U/L  17  14       Latest Ref Rng & Units 03/01/2023    6:21 AM 02/28/2023   11:10 AM 08/21/2022    9:08 AM  CBC  WBC 4.0 - 10.5 K/uL 7.0  7.8  5.0   Hemoglobin 12.0 - 15.0 g/dL 43.3  29.5  18.8   Hematocrit 36.0 - 46.0 % 36.5  39.9  35.8   Platelets 150 - 400 K/uL 188  197  199     RADS: Narrative & Impression  CLINICAL DATA:  8 hour delay for small-bowel obstruction   EXAM: PORTABLE ABDOMEN - 1 VIEW   COMPARISON:  CT abdomen and pelvis 02/28/2023   FINDINGS: Contrast material is demonstrated in the stomach. No contrast material is seen in the small bowel or colon. Continued delayed imaging is recommended for evaluation of small-bowel transit. No  gaseous distention of large or small bowel. Scattered gas and stool throughout the colon.   IMPRESSION: Contrast material remains in the stomach. Continued delayed imaging is recommended for evaluation of small-bowel transit.     Electronically Signed   By: Burman Nieves M.D.   On: 03/01/2023 00:35   Assessment:   SBO, with imaging above.  Been 9hrs since KUB, patient reports additional episodes of emesis and persistent focal pain in RLQ, over area of possible transition point noted on CT.  Based on lack of progression, recommend proceeding with ex-lap, LOA.  Hernia will not be addressed at this time unless it is directly involved with additional scar tissue causing obstruction.  She is a poor candidate for long term repair for hernia due to urgent nature and current BMI.  The risk of surgery include, but not limited to, recurrence, bleeding, chronic pain, post-op infxn, post-op SBO or ileus, hernias, resection of bowel, re-anastamosis, possible ostomy placement and need for re-operation to address said  risks. The risks of general anesthetic, if used, includes MI, CVA, sudden death or even reaction to anesthetic medications also discussed. Alternatives include continued observation and NG decompression.  Benefits include possible symptom relief, preventing further decline in health and possible death.  Typical post-op recovery time of additional days in hospital for observation afterwards also discussed.  The patient verbalized understanding and all questions were answered to the patient's satisfaction.   labs/images/medications/previous chart entries reviewed personally and relevant changes/updates noted above.

## 2023-03-01 NOTE — Transfer of Care (Signed)
Immediate Anesthesia Transfer of Care Note  Patient: Nyeema Daley [place]  Procedure(s) Performed: EXPLORATORY LAPAROTOMY (Abdomen) PRIMARY REPAIR OF INCISIONAL HERNIA (Abdomen) LYSIS OF ADHESIONS (Abdomen)  Patient Location: PACU  Anesthesia Type:General  Level of Consciousness: drowsy and patient cooperative  Airway & Oxygen Therapy: Patient Spontanous Breathing and Patient connected to face mask oxygen  Post-op Assessment: Report given to RN and Patient moving all extremities X 4  Post vital signs: Reviewed and stable  Last Vitals:  Vitals Value Taken Time  BP 134/66 03/01/23 1434  Temp 36.6 C 03/01/23 1433  Pulse 71 03/01/23 1436  Resp 20 03/01/23 1436  SpO2 100 % 03/01/23 1436  Vitals shown include unfiled device data.  Last Pain:  Vitals:   03/01/23 1433  TempSrc: Tympanic  PainSc: 0-No pain         Complications: No notable events documented.

## 2023-03-01 NOTE — Plan of Care (Signed)
°  Problem: Clinical Measurements: °Goal: Will remain free from infection °Outcome: Progressing °  °Problem: Nutrition: °Goal: Adequate nutrition will be maintained °Outcome: Progressing °  °Problem: Elimination: °Goal: Will not experience complications related to bowel motility °Outcome: Progressing °  °Problem: Pain Managment: °Goal: General experience of comfort will improve °Outcome: Progressing °  °

## 2023-03-01 NOTE — Progress Notes (Signed)
PROGRESS NOTE    Terri Werner  MWU:[id number] DOB: Feb 09, 1951 DOA: 02/28/2023 PCP: Margarita Mail, DO    Brief Narrative:   72 y.o. female with medical history significant of hypertension, history of CVA who presents to the ED for evaluation of abdominal pain associated with intractable nausea and vomiting.  Started 1 day prior to admission.  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   No recent changes in diet.  No recent travel.  Pain occurred acutely, described as sharp in nature, right lower quadrant.  Vital signs stable.  CAT scan revealed partial mid to distal small bowel obstruction with evidence of paraesophageal hernia.   Surgery consulted by EDP.  NG tube deferred at this time as patient is not nauseous nor vomiting and appears comfortable.  Hospitalist contacted for admission.  10/18: Case discussed with general surgery.  Small bowel follow-through shows retained contrast in the stomach.  Physical exam is unchanged.  Plan for surgical exploration today.   Assessment & Plan:   Principal Problem:   Partial bowel obstruction (HCC) Active Problems:   SBO (small bowel obstruction) (HCC)  Small bowel obstruction Status post small bowel follow-through on 10/18 at midnight.  Retained contrast in the stomach with persistent abdominal pain and persistent vomiting Plan: OR today for surgical exploration Continue n.p.o., IVF, pain control, antiemetics   History of CVA No acute issues   Essential hypertension Continue home amlodipine 5 mg daily Continue holding Diovan for now   History of colovesicular fistula Outpatient urology follow-up   DVT prophylaxis: SQ Lovenox Code Status: Full Family Communication: None Disposition Plan: Status is: Inpatient Remains inpatient appropriate because: SBO   Level of care: Med-Surg  Consultants:  General  surgery  Procedures:  Exploratory laparotomy, lysis of adhesions 10/18  Antimicrobials: None    Subjective: seen and examined.  Sitting up on edge of bed.  Reports persistent abdominal pain  Objective: Vitals:   02/28/23 2011 03/01/23 0441 03/01/23 0816 03/01/23 1154  BP: (!) 148/82 125/71 122/82 127/78  Pulse: 78 84 80 70  Resp: 18 18 16 18   Temp: 99.1 F (37.3 C) 97.9 F (36.6 C) 98.8 F (37.1 C) 98.9 F (37.2 C)  TempSrc: Oral Oral Oral Oral  SpO2: 99% 98% 96% 97%  Weight:    95.6 kg  Height:    5\' 2"  (1.575 m)    Intake/Output Summary (Last 24 hours) at 03/01/2023 1222 Last data filed at 03/01/2023 0700 Gross per 24 hour  Intake 900.36 ml  Output 400 ml  Net 500.36 ml   Filed Weights   02/28/23 1117 03/01/23 1154  Weight: 95.6 kg 95.6 kg    Examination:  General exam: NAD Respiratory system: Clear to auscultation. Respiratory effort normal. Cardiovascular system: S1-S2, RRR, no murmurs, no pedal edema Gastrointestinal system: Soft, nondistended, tender to palpation right lower quadrant, decreased bowel sounds Central nervous system: Alert and oriented. No focal neurological deficits. Extremities: Symmetric 5 x 5 power. Skin: No rashes, lesions or ulcers Psychiatry: Judgement and insight appear normal. Mood & affect appropriate.     Data Reviewed: I have personally reviewed following labs and imaging studies  CBC: Recent Labs  Lab 02/28/23 1110 03/01/23 0621  WBC 7.8 7.0  HGB 13.2 12.2  HCT 39.9 36.5  MCV 86.6 86.7  PLT 197 188   Basic Metabolic Panel: Recent Labs  Lab 02/28/23 1110 03/01/23 0621  NA 137 139  K 3.9 3.9  CL 99 100  CO2 29 29  GLUCOSE 147* 154*  BUN 20 17  CREATININE 1.01* 0.98  CALCIUM 9.8 9.2   GFR: Estimated Creatinine Clearance: 56.8 mL/min (by C-G formula based on SCr of 0.98 mg/dL). Liver Function Tests: Recent Labs  Lab 02/28/23 1110  AST 22  ALT 17  ALKPHOS 50  BILITOT 0.8  PROT 8.5*  ALBUMIN 4.4    Recent Labs  Lab 02/28/23 1110  LIPASE 32   No results for input(s): "AMMONIA" in the last 168 hours. Coagulation Profile: No results for input(s): "INR", "PROTIME" in the last 168 hours. Cardiac Enzymes: No results for input(s): "CKTOTAL", "CKMB", "CKMBINDEX", "TROPONINI" in the last 168 hours. BNP (last 3 results) No results for input(s): "PROBNP" in the last 8760 hours. HbA1C: No results for input(s): "HGBA1C" in the last 72 hours. CBG: No results for input(s): "GLUCAP" in the last 168 hours. Lipid Profile: No results for input(s): "CHOL", "HDL", "LDLCALC", "TRIG", "CHOLHDL", "LDLDIRECT" in the last 72 hours. Thyroid Function Tests: No results for input(s): "TSH", "T4TOTAL", "FREET4", "T3FREE", "THYROIDAB" in the last 72 hours. Anemia Panel: No results for input(s): "VITAMINB12", "FOLATE", "FERRITIN", "TIBC", "IRON", "RETICCTPCT" in the last 72 hours. Sepsis Labs: No results for input(s): "PROCALCITON", "LATICACIDVEN" in the last 168 hours.  No results found for this or any previous visit (from the past 240 hour(s)).       Radiology Studies: DG Abd Portable 1V-Small Bowel Obstruction Protocol-initial, 8 hr delay  Result Date: 03/01/2023 CLINICAL DATA:  8 hour delay for small-bowel obstruction EXAM: PORTABLE ABDOMEN - 1 VIEW COMPARISON:  CT abdomen and pelvis 02/28/2023 FINDINGS: Contrast material is demonstrated in the stomach. No contrast material is seen in the small bowel or colon. Continued delayed imaging is recommended for evaluation of small-bowel transit. No gaseous distention of large or small bowel. Scattered gas and stool throughout the colon. IMPRESSION: Contrast material remains in the stomach. Continued delayed imaging is recommended for evaluation of small-bowel transit. Electronically Signed   By: Burman Nieves M.D.   On: 03/01/2023 00:35   CT ABDOMEN PELVIS W CONTRAST  Result Date: 02/28/2023 CLINICAL DATA:  Right lower quadrant abdominal pain since  2 a.m. EXAM: CT ABDOMEN AND PELVIS WITH CONTRAST TECHNIQUE: Multidetector CT imaging of the abdomen and pelvis was performed using the standard protocol following bolus administration of intravenous contrast. RADIATION DOSE REDUCTION: This exam was performed according to the departmental dose-optimization program which includes automated exposure control, adjustment of the mA and/or kV according to patient size and/or use of iterative reconstruction technique. CONTRAST:  OMNIPAQUE IOHEXOL 300 MG/ML  SOLN COMPARISON:  10/10/2021 CT abdomen/pelvis FINDINGS: Lower chest: No significant pulmonary nodules or acute consolidative airspace disease. Coronary atherosclerosis. Hepatobiliary: Normal liver. Subcentimeter hypodense lateral segment left liver lesion, too small to characterize, unchanged, considered benign. No new liver lesions. Normal gallbladder with no radiopaque cholelithiasis. No biliary ductal dilatation. Pancreas: Normal, with no mass or duct dilation. Spleen: Normal size. No mass. Adrenals/Urinary Tract: Normal adrenals. No hydronephrosis. Scattered subcentimeter hypodense bilateral renal cortical lesions, too small to characterize, for which no follow-up imaging is recommended. Normal bladder. Stomach/Bowel: Small hiatal hernia. Otherwise normal nondistended stomach. Several mildly dilated fluid-filled mid small bowel loops located in the central lower and right abdomen measuring up to 3.4 cm diameter. Distal small bowel is collapsed. Suggestion of a focal small bowel kink and caliber transition in the right lower quadrant (series 5/image 43). No bowel pneumatosis or definite small bowel  wall thickening. Appendectomy. Cecum appears mobile and located in mid to upper right abdomen. Chronic large left periumbilical hernia containing a portion of the transverse colon. Moderate left colonic diverticulosis. No large bowel wall thickening, focal large bowel caliber transition or significant pericolonic fat  stranding. Vascular/Lymphatic: Atherosclerotic nonaneurysmal abdominal aorta. Patent portal, splenic, hepatic and renal veins. No pathologically enlarged lymph nodes in the abdomen or pelvis. Reproductive: Status post hysterectomy, with no abnormal findings at the vaginal cuff. No adnexal mass. Other: No pneumoperitoneum. Trace perihepatic ascites. No focal fluid collection. Musculoskeletal: No aggressive appearing focal osseous lesions. Moderate thoracolumbar spondylosis. IMPRESSION: 1. CT findings are compatible with a partial mid to distal small bowel obstruction, potentially due to adhesions. No bowel pneumatosis, definite small bowel wall thickening or pneumoperitoneum. 2. Chronic large left periumbilical hernia containing a portion of the transverse colon without findings to suggest large bowel obstruction. 3. Trace perihepatic ascites. 4. Small hiatal hernia. 5. Moderate left colonic diverticulosis. 6. Coronary atherosclerosis. 7.  Aortic Atherosclerosis (ICD10-I70.0). Electronically Signed   By: Delbert Phenix M.D.   On: 02/28/2023 14:51        Scheduled Meds:  [MAR Hold] amLODipine  5 mg Oral Daily   [MAR Hold] enoxaparin (LOVENOX) injection  45 mg Subcutaneous Q24H   [MAR Hold] pantoprazole (PROTONIX) IV  40 mg Intravenous Daily   Continuous Infusions:   ceFAZolin (ANCEF) IV     dextrose 5 % and 0.9 % NaCl 100 mL/hr at 03/01/23 0700     LOS: 0 days      Tresa Moore, MD [facility]   If 7PM-7AM, please contact night-coverage  03/01/2023, 12:22 PM

## 2023-03-01 NOTE — Anesthesia Procedure Notes (Signed)
Procedure Name: Intubation Date/Time: 03/01/2023 12:15 PM  Performed by: Lily Lovings, CRNAPre-anesthesia Checklist: Patient identified, Patient being monitored, Timeout performed, Emergency Drugs available and Suction available Patient Re-evaluated:Patient Re-evaluated prior to induction Oxygen Delivery Method: Circle system utilized Preoxygenation: Pre-oxygenation with 100% oxygen Induction Type: IV induction, Rapid sequence and Cricoid Pressure applied Laryngoscope Size: Mac and 3 Grade View: Grade I Tube type: Oral Tube size: 7.0 mm Number of attempts: 2 Airway Equipment and Method: Stylet Placement Confirmation: ETT inserted through vocal cords under direct vision, positive ETCO2 and breath sounds checked- equal and bilateral Secured at: 21 cm Tube secured with: Tape Dental Injury: Teeth and Oropharynx as per pre-operative assessment

## 2023-03-01 NOTE — Op Note (Signed)
Pre-Op Dx: Small bowel obstruction, incisional hernia Post-Op Dx: Same Anesthesia: GETA EBL: 250 mL Complications:  none apparent Specimen: None Procedure: Exploratory laparotomy, lysis of adhesions, primary repair of initial, reducible incisional hernia repair  Surgeon: Tonna Boehringer  Indications for procedure: See H&P and consult note.  Description of Procedure:  Consent obtained, time out performed.  Patient placed in supine position.  Foley placed area sterilized and draped in usual position.  Periumbilical midline incision made and hernia sac entered there was immediately deep to the area.  Extensive lysis of adhesions then performed over two thirds of the total operative time in order to gain access into the abdominal cavity and the point of transition point within the small bowel causing the small bowel obstruction.  Adhesions were mainly between the abdominal wall and the thick omentum overlying the transverse colon and small bowel.  Several area of arterial bleed noted within the omentum during dissection causing some bleeding.  All bleeding eventually was controlled and no active bleeding was noted by the end of the procedure.   Omental adhesive band was noted to be at the transition point of the small bowel and this was removed.  Small bowel was then inspected from ligament of Treitz to ileocecal valve with no additional pathology noted.  Additional omentum was then separated from the abdominal wall and hernia sac in order to completely free the transverse colon.  Combination of blunt dissection, electrocautery, and LigaSure used to complete this portion.  Hernia sac then resected around the fascial defect and within the subcutaneous tissue.  1-0 PDS x2 was then used to close the midline incision and hernia defect which was easily approximated.  Hernia defect size measured to be approximately 5 cm x 7 cm.  Zynrelef then infused along the closed incision site.  Skin then closed with staples.   Wound then dressed with Prevena wound VAC.  Pt tolerated procedure well, and transferred to PACU in stable condition, Foley and NG in place.  Sponge and instrument count correct at end of procedure.

## 2023-03-02 ENCOUNTER — Encounter: Payer: Self-pay | Admitting: Surgery

## 2023-03-02 DIAGNOSIS — K5651 Intestinal adhesions [bands], with partial obstruction: Secondary | ICD-10-CM | POA: Diagnosis not present

## 2023-03-02 LAB — CBC WITH DIFFERENTIAL/PLATELET
Abs Immature Granulocytes: 0.02 10*3/uL (ref 0.00–0.07)
Basophils Absolute: 0 10*3/uL (ref 0.0–0.1)
Basophils Relative: 0 %
Eosinophils Absolute: 0 10*3/uL (ref 0.0–0.5)
Eosinophils Relative: 0 %
HCT: 35.6 % — ABNORMAL LOW (ref 36.0–46.0)
Hemoglobin: 11.6 g/dL — ABNORMAL LOW (ref 12.0–15.0)
Immature Granulocytes: 0 %
Lymphocytes Relative: 13 %
Lymphs Abs: 1.3 10*3/uL (ref 0.7–4.0)
MCH: 28.8 pg (ref 26.0–34.0)
MCHC: 32.6 g/dL (ref 30.0–36.0)
MCV: 88.3 fL (ref 80.0–100.0)
Monocytes Absolute: 1.5 10*3/uL — ABNORMAL HIGH (ref 0.1–1.0)
Monocytes Relative: 14 %
Neutro Abs: 7.6 10*3/uL (ref 1.7–7.7)
Neutrophils Relative %: 73 %
Platelets: 171 10*3/uL (ref 150–400)
RBC: 4.03 MIL/uL (ref 3.87–5.11)
RDW: 15.3 % (ref 11.5–15.5)
WBC: 10.4 10*3/uL (ref 4.0–10.5)
nRBC: 0 % (ref 0.0–0.2)

## 2023-03-02 LAB — BASIC METABOLIC PANEL
Anion gap: 8 (ref 5–15)
BUN: 20 mg/dL (ref 8–23)
CO2: 28 mmol/L (ref 22–32)
Calcium: 8.8 mg/dL — ABNORMAL LOW (ref 8.9–10.3)
Chloride: 104 mmol/L (ref 98–111)
Creatinine, Ser: 0.99 mg/dL (ref 0.44–1.00)
GFR, Estimated: >60 mL/min (ref >60)
Glucose, Bld: 104 mg/dL — ABNORMAL HIGH (ref 70–99)
Potassium: 3.9 mmol/L (ref 3.5–5.1)
Sodium: 140 mmol/L (ref 135–145)

## 2023-03-02 MED ORDER — PANTOPRAZOLE SODIUM 40 MG PO TBEC
40.0000 mg | DELAYED_RELEASE_TABLET | Freq: Every day | ORAL | Status: DC
  Administered 2023-03-03 – 2023-03-06 (×4): 40 mg via ORAL
  Filled 2023-03-02 (×4): qty 1

## 2023-03-02 MED ORDER — PHENOL 1.4 % MT LIQD
1.0000 | OROMUCOSAL | Status: DC | PRN
  Filled 2023-03-02: qty 177

## 2023-03-02 NOTE — Progress Notes (Signed)
Subjective:  CC: Terri Werner is a 72 y.o. female  Hospital stay day 1, 1 Day Post-Op ex-lap, LOA, primary repair of incisional hernia for SBO  HPI: No acute complaints.  NG uncomfortable, some pain around incision  ROS:  General: Denies weight loss, weight gain, fatigue, fevers, chills, and night sweats. Heart: Denies chest pain, palpitations, racing heart, irregular heartbeat, leg pain or swelling, and decreased activity tolerance. Respiratory: Denies breathing difficulty, shortness of breath, wheezing, cough, and sputum. GI: Denies change in appetite, heartburn, nausea, vomiting, constipation, diarrhea, and blood in stool. GU: Denies difficulty urinating, pain with urinating, urgency, frequency, blood in urine.   Objective:   Temp:  [97.6 F (36.4 C)-98.7 F (37.1 C)] 97.6 F (36.4 C) (10/19 0948) Pulse Rate:  [67-81] 70 (10/19 0948) Resp:  [16-18] 16 (10/19 0948) BP: (106-130)/(51-65) 130/65 (10/19 0948) SpO2:  [96 %-100 %] 100 % (10/19 0948)     Height: 5\' 2"  (157.5 cm) Weight: 95.6 kg BMI (Calculated): 38.54   Intake/Output this shift:  No intake or output data in the 24 hours ending 03/02/23 1600  Constitutional :  alert, cooperative, appears stated age, and no distress  Respiratory:  clear to auscultation bilaterally  Cardiovascular:  regular rate and rhythm  Gastrointestinal: Soft, no guarding, TTP around incision, some distention .   Skin: Cool and moist. Prevena intact.  Psychiatric: Normal affect, non-agitated, not confused       LABS:     Latest Ref Rng & Units 03/02/2023    8:25 AM 03/01/2023    6:21 AM 02/28/2023   11:10 AM  CMP  Glucose 70 - 99 mg/dL 540  981  191   BUN 8 - 23 mg/dL 20  17  20    Creatinine 0.44 - 1.00 mg/dL 4.78  2.95  6.21   Sodium 135 - 145 mmol/L 140  139  137   Potassium 3.5 - 5.1 mmol/L 3.9  3.9  3.9   Chloride 98 - 111 mmol/L 104  100  99   CO2 22 - 32 mmol/L 28  29  29    Calcium 8.9 - 10.3 mg/dL 8.8  9.2  9.8    Total Protein 6.5 - 8.1 g/dL   8.5   Total Bilirubin 0.3 - 1.2 mg/dL   0.8   Alkaline Phos 38 - 126 U/L   50   AST 15 - 41 U/L   22   ALT 0 - 44 U/L   17       Latest Ref Rng & Units 03/02/2023    8:25 AM 03/01/2023    6:21 AM 02/28/2023   11:10 AM  CBC  WBC 4.0 - 10.5 K/uL 10.4  7.0  7.8   Hemoglobin 12.0 - 15.0 g/dL 30.8  65.7  84.6   Hematocrit 36.0 - 46.0 % 35.6  36.5  39.9   Platelets 150 - 400 K/uL 171  188  197     RADS: N/a Assessment:   S/p ex-lap, LOA, primary repair of incisional hernia for SBO.  Will continue to monitor for ROBF.  Encouarged gum, hard candy, ambulation  labs/images/medications/previous chart entries reviewed personally and relevant changes/updates noted above.

## 2023-03-02 NOTE — Progress Notes (Signed)
PHARMACIST - PHYSICIAN COMMUNICATION  CONCERNING: IV to Oral Route Change Policy  RECOMMENDATION: This patient is receiving pantoprazole by the intravenous route.  Based on criteria approved by the Pharmacy and Therapeutics Committee, the intravenous medication(s) is/are being converted to the equivalent oral dose form(s).  DESCRIPTION: These criteria include: The patient is eating (either orally or via tube) and/or has been taking other orally administered medications for a least 24 hours The patient has no evidence of active gastrointestinal bleeding or impaired GI absorption (gastrectomy, short bowel, patient on TNA or NPO).  If you have questions about this conversion, please contact the Pharmacy Department   Tressie Ellis, [facility] 03/02/2023 11:23 AM

## 2023-03-02 NOTE — Plan of Care (Signed)
  Problem: Elimination: ?Goal: Will not experience complications related to bowel motility ?Outcome: Not Progressing ?  ?Problem: Pain Managment: ?Goal: General experience of comfort will improve ?Outcome: Not Progressing ?  ?

## 2023-03-02 NOTE — Progress Notes (Signed)
PROGRESS NOTE    Terri Werner  ZOX:[id number] DOB: Jan 08, 1951 DOA: 02/28/2023 PCP: Margarita Mail, DO    Brief Narrative:   72 y.o. female with medical history significant of hypertension, history of CVA who presents to the ED for evaluation of abdominal pain associated with intractable nausea and vomiting.  Started 1 day prior to admission.  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   No recent changes in diet.  No recent travel.  Pain occurred acutely, described as sharp in nature, right lower quadrant.  Vital signs stable.  CAT scan revealed partial mid to distal small bowel obstruction with evidence of paraesophageal hernia.   Surgery consulted by EDP.  NG tube deferred at this time as patient is not nauseous nor vomiting and appears comfortable.  Hospitalist contacted for admission.  10/18: Case discussed with general surgery.  Small bowel follow-through shows retained contrast in the stomach.  Physical exam is unchanged.  Plan for surgical exploration today.  10/19: Postop day 1 status post lysis of adhesions exploratory laparotomy with general surgery.  Tolerated procedure well.   Assessment & Plan:   Principal Problem:   Partial bowel obstruction (HCC) Active Problems:   SBO (small bowel obstruction) (HCC)  Small bowel obstruction Status post small bowel follow-through on 10/18 at midnight.  Retained contrast in the stomach with persistent abdominal pain and persistent vomiting Status post OR with general surgery 10/18.  Exploratory laparotomy and lysis of adhesions.  Tolerated procedure well. Plan: N.p.o. NGT set to LIS IVF, pain control, antiemetics Defer to surgery regarding diet advancement and discontinuation of NGT  History of CVA No acute issues   Essential hypertension Continue home amlodipine 5 mg daily Continue holding Diovan for  now   History of colovesicular fistula Outpatient urology follow-up   DVT prophylaxis: SQ Lovenox Code Status: Full Family Communication: None Disposition Plan: Status is: Inpatient Remains inpatient appropriate because: SBO   Level of care: Med-Surg  Consultants:  General surgery  Procedures:  Exploratory laparotomy, lysis of adhesions 10/18  Antimicrobials: None    Subjective: Seen and examined.  Lying in bed.  NGT in place.  Endorses discomfort from NGT but no pain.  Objective: Vitals:   03/01/23 1543 03/01/23 2046 03/02/23 0302 03/02/23 0948  BP:  115/62 (!) 106/51 130/65  Pulse: 73 81 67 70  Resp: 16 18 18 16   Temp: (!) 97.4 F (36.3 C) 98.7 F (37.1 C) 97.7 F (36.5 C) 97.6 F (36.4 C)  TempSrc:  Oral  Oral  SpO2: 94% 96% 98% 100%  Weight:      Height:        Intake/Output Summary (Last 24 hours) at 03/02/2023 1241 Last data filed at 03/01/2023 1438 Gross per 24 hour  Intake 1140 ml  Output 250 ml  Net 890 ml   Filed Weights   02/28/23 1117 03/01/23 1154  Weight: 95.6 kg 95.6 kg    Examination:  General exam: No acute distress Respiratory system: Clear to auscultation. Respiratory effort normal. Cardiovascular system: S1-S2, RRR, no murmurs, no pedal edema Gastrointestinal system: Soft, nondistended, mild TTP right lower quadrant, decreased bowel sounds, NGT in place Central nervous system: Alert and oriented. No focal neurological deficits. Extremities: Symmetric 5 x 5 power. Skin: No rashes, lesions or ulcers Psychiatry: Judgement and insight appear normal. Mood & affect appropriate.     Data Reviewed: I have personally reviewed following labs and  imaging studies  CBC: Recent Labs  Lab 02/28/23 1110 03/01/23 0621 03/02/23 0825  WBC 7.8 7.0 10.4  NEUTROABS  --   --  7.6  HGB 13.2 12.2 11.6*  HCT 39.9 36.5 35.6*  MCV 86.6 86.7 88.3  PLT 197 188 171   Basic Metabolic Panel: Recent Labs  Lab 02/28/23 1110 03/01/23 0621  03/02/23 0825  NA 137 139 140  K 3.9 3.9 3.9  CL 99 100 104  CO2 29 29 28   GLUCOSE 147* 154* 104*  BUN 20 17 20   CREATININE 1.01* 0.98 0.99  CALCIUM 9.8 9.2 8.8*   GFR: Estimated Creatinine Clearance: 56.2 mL/min (by C-G formula based on SCr of 0.99 mg/dL). Liver Function Tests: Recent Labs  Lab 02/28/23 1110  AST 22  ALT 17  ALKPHOS 50  BILITOT 0.8  PROT 8.5*  ALBUMIN 4.4   Recent Labs  Lab 02/28/23 1110  LIPASE 32   No results for input(s): "AMMONIA" in the last 168 hours. Coagulation Profile: No results for input(s): "INR", "PROTIME" in the last 168 hours. Cardiac Enzymes: No results for input(s): "CKTOTAL", "CKMB", "CKMBINDEX", "TROPONINI" in the last 168 hours. BNP (last 3 results) No results for input(s): "PROBNP" in the last 8760 hours. HbA1C: No results for input(s): "HGBA1C" in the last 72 hours. CBG: No results for input(s): "GLUCAP" in the last 168 hours. Lipid Profile: No results for input(s): "CHOL", "HDL", "LDLCALC", "TRIG", "CHOLHDL", "LDLDIRECT" in the last 72 hours. Thyroid Function Tests: No results for input(s): "TSH", "T4TOTAL", "FREET4", "T3FREE", "THYROIDAB" in the last 72 hours. Anemia Panel: No results for input(s): "VITAMINB12", "FOLATE", "FERRITIN", "TIBC", "IRON", "RETICCTPCT" in the last 72 hours. Sepsis Labs: No results for input(s): "PROCALCITON", "LATICACIDVEN" in the last 168 hours.  No results found for this or any previous visit (from the past 240 hour(s)).       Radiology Studies: DG Abd Portable 1V-Small Bowel Obstruction Protocol-initial, 8 hr delay  Result Date: 03/01/2023 CLINICAL DATA:  8 hour delay for small-bowel obstruction EXAM: PORTABLE ABDOMEN - 1 VIEW COMPARISON:  CT abdomen and pelvis 02/28/2023 FINDINGS: Contrast material is demonstrated in the stomach. No contrast material is seen in the small bowel or colon. Continued delayed imaging is recommended for evaluation of small-bowel transit. No gaseous distention  of large or small bowel. Scattered gas and stool throughout the colon. IMPRESSION: Contrast material remains in the stomach. Continued delayed imaging is recommended for evaluation of small-bowel transit. Electronically Signed   By: Burman Nieves M.D.   On: 03/01/2023 00:35        Scheduled Meds:  amLODipine  5 mg Oral Daily   celecoxib  200 mg Oral Daily   enoxaparin (LOVENOX) injection  45 mg Subcutaneous Q24H   gabapentin  200 mg Oral BID   [START ON 03/03/2023] pantoprazole  40 mg Oral Daily   Continuous Infusions:     LOS: 1 day      Tresa Moore, MD [facility]   If 7PM-7AM, please contact night-coverage  03/02/2023, 12:41 PM

## 2023-03-02 NOTE — Plan of Care (Signed)

## 2023-03-03 DIAGNOSIS — K5651 Intestinal adhesions [bands], with partial obstruction: Secondary | ICD-10-CM | POA: Diagnosis not present

## 2023-03-03 MED ORDER — DEXTROSE-SODIUM CHLORIDE 5-0.45 % IV SOLN: INTRAVENOUS | Status: AC

## 2023-03-03 NOTE — Progress Notes (Signed)
PROGRESS NOTE    Terri Werner  OZH:[id number] DOB: 09-Jan-1951 DOA: 02/28/2023 PCP: Margarita Mail, DO    Brief Narrative:   72 y.o. female with medical history significant of hypertension, history of CVA who presents to the ED for evaluation of abdominal pain associated with intractable nausea and vomiting.  Started 1 day prior to admission.  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   No recent changes in diet.  No recent travel.  Pain occurred acutely, described as sharp in nature, right lower quadrant.  Vital signs stable.  CAT scan revealed partial mid to distal small bowel obstruction with evidence of paraesophageal hernia.   Surgery consulted by EDP.  NG tube deferred at this time as patient is not nauseous nor vomiting and appears comfortable.  Hospitalist contacted for admission.  10/18: Case discussed with general surgery.  Small bowel follow-through shows retained contrast in the stomach.  Physical exam is unchanged.  Plan for surgical exploration today.  10/19: Postop day 1 status post lysis of adhesions exploratory laparotomy with general surgery.  Tolerated procedure well.   Assessment & Plan:   Principal Problem:   Partial bowel obstruction (HCC) Active Problems:   SBO (small bowel obstruction) (HCC)  Small bowel obstruction Status post small bowel follow-through on 10/18 at midnight.  Retained contrast in the stomach with persistent abdominal pain and persistent vomiting Status post OR with general surgery 10/18.  Exploratory laparotomy and lysis of adhesions.  Tolerated procedure well. Plan: Continue n.p.o. NGT Needed pain control and antiemetics Monitor for return of bowel function Defer to general surgery recommendations regarding advancement of diet and discontinuation of NG tube  History of CVA No acute issues   Essential  hypertension Continue home amlodipine 5 mg daily Continue to hold Diovan hydrochlorothiazide for now   History of colovesicular fistula Outpatient urology follow-up   DVT prophylaxis: SQ Lovenox Code Status: Full Family Communication: None Disposition Plan: Status is: Inpatient Remains inpatient appropriate because: SBO   Level of care: Med-Surg  Consultants:  General surgery  Procedures:  Exploratory laparotomy, lysis of adhesions 10/18  Antimicrobials: None    Subjective: Seen examined.  Sitting up in bed.  In good spirits.  Uncomfortable from NG tube.  Objective: Vitals:   03/02/23 0302 03/02/23 0948 03/02/23 1807 03/02/23 2000  BP: (!) 106/51 130/65 118/77 (!) 129/52  Pulse: 67 70 94 73  Resp: 18 16 16 16   Temp: 97.7 F (36.5 C) 97.6 F (36.4 C) 97.8 F (36.6 C) 98.4 F (36.9 C)  TempSrc:  Oral  Oral  SpO2: 98% 100% 97% 97%  Weight:      Height:       No intake or output data in the 24 hours ending 03/03/23 1248  Filed Weights   02/28/23 1117 03/01/23 1154  Weight: 95.6 kg 95.6 kg    Examination:  General exam: NAD Respiratory system: Clear to auscultation. Respiratory effort normal. Cardiovascular system: S1-S2, RRR, no murmurs, no pedal edema Gastrointestinal system: Soft, nondistended, mild TTP right lower quadrant, decreased bowel sounds, NGT Central nervous system: Alert and oriented. No focal neurological deficits. Extremities: Symmetric 5 x 5 power. Skin: No rashes, lesions or ulcers Psychiatry: Judgement and insight appear normal. Mood & affect appropriate.     Data Reviewed: I have personally reviewed following labs and imaging studies  CBC: Recent Labs  Lab 02/28/23 1110 03/01/23 0621 03/02/23 0825  WBC  7.8 7.0 10.4  NEUTROABS  --   --  7.6  HGB 13.2 12.2 11.6*  HCT 39.9 36.5 35.6*  MCV 86.6 86.7 88.3  PLT 197 188 171   Basic Metabolic Panel: Recent Labs  Lab 02/28/23 1110 03/01/23 0621 03/02/23 0825  NA 137 139 140   K 3.9 3.9 3.9  CL 99 100 104  CO2 29 29 28   GLUCOSE 147* 154* 104*  BUN 20 17 20   CREATININE 1.01* 0.98 0.99  CALCIUM 9.8 9.2 8.8*   GFR: Estimated Creatinine Clearance: 56.2 mL/min (by C-G formula based on SCr of 0.99 mg/dL). Liver Function Tests: Recent Labs  Lab 02/28/23 1110  AST 22  ALT 17  ALKPHOS 50  BILITOT 0.8  PROT 8.5*  ALBUMIN 4.4   Recent Labs  Lab 02/28/23 1110  LIPASE 32   No results for input(s): "AMMONIA" in the last 168 hours. Coagulation Profile: No results for input(s): "INR", "PROTIME" in the last 168 hours. Cardiac Enzymes: No results for input(s): "CKTOTAL", "CKMB", "CKMBINDEX", "TROPONINI" in the last 168 hours. BNP (last 3 results) No results for input(s): "PROBNP" in the last 8760 hours. HbA1C: No results for input(s): "HGBA1C" in the last 72 hours. CBG: No results for input(s): "GLUCAP" in the last 168 hours. Lipid Profile: No results for input(s): "CHOL", "HDL", "LDLCALC", "TRIG", "CHOLHDL", "LDLDIRECT" in the last 72 hours. Thyroid Function Tests: No results for input(s): "TSH", "T4TOTAL", "FREET4", "T3FREE", "THYROIDAB" in the last 72 hours. Anemia Panel: No results for input(s): "VITAMINB12", "FOLATE", "FERRITIN", "TIBC", "IRON", "RETICCTPCT" in the last 72 hours. Sepsis Labs: No results for input(s): "PROCALCITON", "LATICACIDVEN" in the last 168 hours.  No results found for this or any previous visit (from the past 240 hour(s)).       Radiology Studies: No results found.      Scheduled Meds:  amLODipine  5 mg Oral Daily   celecoxib  200 mg Oral Daily   enoxaparin (LOVENOX) injection  45 mg Subcutaneous Q24H   gabapentin  200 mg Oral BID   pantoprazole  40 mg Oral Daily   Continuous Infusions:     LOS: 2 days      Tresa Moore, MD [facility]   If 7PM-7AM, please contact night-coverage  03/03/2023, 12:48 PM

## 2023-03-04 ENCOUNTER — Inpatient Hospital Stay: Payer: Medicare Other

## 2023-03-04 DIAGNOSIS — K5651 Intestinal adhesions [bands], with partial obstruction: Secondary | ICD-10-CM | POA: Diagnosis not present

## 2023-03-04 LAB — CBC WITH DIFFERENTIAL/PLATELET
Abs Immature Granulocytes: 0.01 10*3/uL (ref 0.00–0.07)
Basophils Absolute: 0 10*3/uL (ref 0.0–0.1)
Basophils Relative: 0 %
Eosinophils Absolute: 0.1 10*3/uL (ref 0.0–0.5)
Eosinophils Relative: 2 %
HCT: 32.5 % — ABNORMAL LOW (ref 36.0–46.0)
Hemoglobin: 10.8 g/dL — ABNORMAL LOW (ref 12.0–15.0)
Immature Granulocytes: 0 %
Lymphocytes Relative: 25 %
Lymphs Abs: 1.3 10*3/uL (ref 0.7–4.0)
MCH: 29 pg (ref 26.0–34.0)
MCHC: 33.2 g/dL (ref 30.0–36.0)
MCV: 87.4 fL (ref 80.0–100.0)
Monocytes Absolute: 0.8 10*3/uL (ref 0.1–1.0)
Monocytes Relative: 15 %
Neutro Abs: 2.8 10*3/uL (ref 1.7–7.7)
Neutrophils Relative %: 58 %
Platelets: 159 10*3/uL (ref 150–400)
RBC: 3.72 MIL/uL — ABNORMAL LOW (ref 3.87–5.11)
RDW: 14.7 % (ref 11.5–15.5)
WBC: 4.9 10*3/uL (ref 4.0–10.5)
nRBC: 0 % (ref 0.0–0.2)

## 2023-03-04 LAB — BASIC METABOLIC PANEL
Anion gap: 6 (ref 5–15)
BUN: 14 mg/dL (ref 8–23)
CO2: 26 mmol/L (ref 22–32)
Calcium: 8.2 mg/dL — ABNORMAL LOW (ref 8.9–10.3)
Chloride: 103 mmol/L (ref 98–111)
Creatinine, Ser: 0.78 mg/dL (ref 0.44–1.00)
GFR, Estimated: >60 mL/min (ref >60)
Glucose, Bld: 117 mg/dL — ABNORMAL HIGH (ref 70–99)
Potassium: 3.1 mmol/L — ABNORMAL LOW (ref 3.5–5.1)
Sodium: 135 mmol/L (ref 135–145)

## 2023-03-04 MED ORDER — POTASSIUM CHLORIDE 10 MEQ/100ML IV SOLN
10.0000 meq | INTRAVENOUS | Status: AC
  Administered 2023-03-04 (×4): 10 meq via INTRAVENOUS
  Filled 2023-03-04 (×3): qty 100

## 2023-03-04 NOTE — Progress Notes (Signed)
Received secure chat from Dr. Tonna Boehringer to pull back NG 5cm then clamp. NG pulled from 75cm to 70cm and clamped at 0810. Unclamp at 1110 per order.

## 2023-03-04 NOTE — Plan of Care (Signed)

## 2023-03-04 NOTE — Progress Notes (Signed)
PROGRESS NOTE    Terri Werner  VQQ:[id number] DOB: [birth year] DOA: 02/28/2023 PCP: Margarita Mail, DO    Brief Narrative:   72 y.o. female with medical history significant of hypertension, history of CVA who presents to the ED for evaluation of abdominal pain associated with intractable nausea and vomiting.  Started 1 day prior to admission.  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   No recent changes in diet.  No recent travel.  Pain occurred acutely, described as sharp in nature, right lower quadrant.  Vital signs stable.  CAT scan revealed partial mid to distal small bowel obstruction with evidence of paraesophageal hernia.   Surgery consulted by EDP.  NG tube deferred at this time as patient is not nauseous nor vomiting and appears comfortable.  Hospitalist contacted for admission.  10/18: Case discussed with general surgery.  Small bowel follow-through shows retained contrast in the stomach.  Physical exam is unchanged.  Plan for surgical exploration today.  10/19: Postop day 1 status post lysis of adhesions exploratory laparotomy with general surgery.  Tolerated procedure well.  10/21: Clamping trial done today with minimal residual.  Still awaiting return of bowel function.   Assessment & Plan:   Principal Problem:   Partial bowel obstruction (HCC) Active Problems:   SBO (small bowel obstruction) (HCC)  Small bowel obstruction Status post small bowel follow-through on 10/18 at midnight.  Retained contrast in the stomach with persistent abdominal pain and persistent vomiting Status post OR with general surgery 10/18.  Exploratory laparotomy and lysis of adhesions.  Tolerated procedure well. Plan: Continue n.p.o. Continue NGT Clamping trial today Encourage gum, hard candy, ambulation Monitor for return of bowel function Surgery  follow-up  History of CVA No acute issues   Essential hypertension Continue home amlodipine 5 mg daily Continue to hold Diovan hydrochlorothiazide for now   History of colovesicular fistula Outpatient urology follow-up   DVT prophylaxis: SQ Lovenox Code Status: Full Family Communication: None Disposition Plan: Status is: Inpatient Remains inpatient appropriate because: SBO   Level of care: Med-Surg  Consultants:  General surgery  Procedures:  Exploratory laparotomy, lysis of adhesions 10/18  Antimicrobials: None    Subjective: Seen and examined.  Sitting up in bed.  In good spirits.  Continues to endorse discomfort from NGT  Objective: Vitals:   03/02/23 2000 03/03/23 1932 03/04/23 0400 03/04/23 0757  BP: (!) 129/52 104/72 (!) 114/57 115/64  Pulse: 73 83 65 73  Resp: 16 18  16   Temp: 98.4 F (36.9 C) 97.9 F (36.6 C) 98.6 F (37 C) 98 F (36.7 C)  TempSrc: Oral Oral Oral   SpO2: 97% 100% 94% 100%  Weight:      Height:        Intake/Output Summary (Last 24 hours) at 03/04/2023 1447 Last data filed at 03/04/2023 0300 Gross per 24 hour  Intake 790.87 ml  Output 50 ml  Net 740.87 ml    Filed Weights   02/28/23 1117 03/01/23 1154  Weight: 95.6 kg 95.6 kg    Examination:  General exam: No acute distress Respiratory system: Clear to auscultation. Respiratory effort normal. Cardiovascular system: S1-S2, RRR, no murmurs, no pedal edema Gastrointestinal system: Soft, nondistended, mild TTP, hypoactive bowel sounds Central nervous system: Alert and oriented. No focal neurological deficits. Extremities: Symmetric 5 x 5 power. Skin: No rashes, lesions or ulcers Psychiatry: Judgement and insight appear normal. Mood & affect  appropriate.     Data Reviewed: I have personally reviewed following labs and imaging studies  CBC: Recent Labs  Lab 02/28/23 1110 03/01/23 0621 03/02/23 0825 03/04/23 1226  WBC 7.8 7.0 10.4 4.9  NEUTROABS  --   --  7.6 2.8   HGB 13.2 12.2 11.6* 10.8*  HCT 39.9 36.5 35.6* 32.5*  MCV 86.6 86.7 88.3 87.4  PLT 197 188 171 159   Basic Metabolic Panel: Recent Labs  Lab 02/28/23 1110 03/01/23 0621 03/02/23 0825 03/04/23 1226  NA 137 139 140 135  K 3.9 3.9 3.9 3.1*  CL 99 100 104 103  CO2 29 29 28 26   GLUCOSE 147* 154* 104* 117*  BUN 20 17 20 14   CREATININE 1.01* 0.98 0.99 0.78  CALCIUM 9.8 9.2 8.8* 8.2*   GFR: Estimated Creatinine Clearance: 69.5 mL/min (by C-G formula based on SCr of 0.78 mg/dL). Liver Function Tests: Recent Labs  Lab 02/28/23 1110  AST 22  ALT 17  ALKPHOS 50  BILITOT 0.8  PROT 8.5*  ALBUMIN 4.4   Recent Labs  Lab 02/28/23 1110  LIPASE 32   No results for input(s): "AMMONIA" in the last 168 hours. Coagulation Profile: No results for input(s): "INR", "PROTIME" in the last 168 hours. Cardiac Enzymes: No results for input(s): "CKTOTAL", "CKMB", "CKMBINDEX", "TROPONINI" in the last 168 hours. BNP (last 3 results) No results for input(s): "PROBNP" in the last 8760 hours. HbA1C: No results for input(s): "HGBA1C" in the last 72 hours. CBG: No results for input(s): "GLUCAP" in the last 168 hours. Lipid Profile: No results for input(s): "CHOL", "HDL", "LDLCALC", "TRIG", "CHOLHDL", "LDLDIRECT" in the last 72 hours. Thyroid Function Tests: No results for input(s): "TSH", "T4TOTAL", "FREET4", "T3FREE", "THYROIDAB" in the last 72 hours. Anemia Panel: No results for input(s): "VITAMINB12", "FOLATE", "FERRITIN", "TIBC", "IRON", "RETICCTPCT" in the last 72 hours. Sepsis Labs: No results for input(s): "PROCALCITON", "LATICACIDVEN" in the last 168 hours.  No results found for this or any previous visit (from the past 240 hour(s)).       Radiology Studies: DG Abd 1 View  Result Date: 03/04/2023 CLINICAL DATA:  Ileus. EXAM: ABDOMEN - 1 VIEW COMPARISON:  March 01, 2023. FINDINGS: Distal tip of nasogastric tube is seen in proximal stomach. Contrast is noted in nondilated  right colon. Mild small bowel dilatation is noted suggesting ileus or possibly distal small bowel obstruction. Midline surgical staples are noted. IMPRESSION: Mild small bowel dilatation is noted suggesting ileus or possibly small bowel obstruction Electronically Signed   By: Lupita Raider M.D.   On: 03/04/2023 11:50        Scheduled Meds:  amLODipine  5 mg Oral Daily   celecoxib  200 mg Oral Daily   enoxaparin (LOVENOX) injection  45 mg Subcutaneous Q24H   gabapentin  200 mg Oral BID   pantoprazole  40 mg Oral Daily   Continuous Infusions:  potassium chloride        LOS: 3 days      Tresa Moore, MD [facility]   If 7PM-7AM, please contact night-coverage  03/04/2023, 2:47 PM

## 2023-03-04 NOTE — Progress Notes (Signed)
Subjective:  CC: Terri Werner is a 72 y.o. female  Hospital stay day 3, 3 Days Post-Op ex-lap, LOA, primary repair of incisional hernia for SBO  HPI: No acute complaints.  Still no flatus or BM. ROS:  General: Denies weight loss, weight gain, fatigue, fevers, chills, and night sweats. Heart: Denies chest pain, palpitations, racing heart, irregular heartbeat, leg pain or swelling, and decreased activity tolerance. Respiratory: Denies breathing difficulty, shortness of breath, wheezing, cough, and sputum. GI: Denies change in appetite, heartburn, nausea, vomiting, constipation, diarrhea, and blood in stool. GU: Denies difficulty urinating, pain with urinating, urgency, frequency, blood in urine.   Objective:   Temp:  [97.9 F (36.6 C)-98.6 F (37 C)] 98 F (36.7 C) (10/21 0757) Pulse Rate:  [65-83] 73 (10/21 0757) Resp:  [16-18] 16 (10/21 0757) BP: (104-115)/(57-72) 115/64 (10/21 0757) SpO2:  [94 %-100 %] 100 % (10/21 0757)     Height: 5\' 2"  (157.5 cm) Weight: 95.6 kg BMI (Calculated): 38.54   Intake/Output this shift:   Intake/Output Summary (Last 24 hours) at 03/04/2023 1300 Last data filed at 03/04/2023 0300 Gross per 24 hour  Intake 790.87 ml  Output 50 ml  Net 740.87 ml    Constitutional :  alert, cooperative, appears stated age, and no distress  Respiratory:  clear to auscultation bilaterally  Cardiovascular:  regular rate and rhythm  Gastrointestinal: Soft, no guarding, TTP around incision site improved.  Decreased distention .   Skin: Cool and moist. Prevena intact.  Psychiatric: Normal affect, non-agitated, not confused       LABS:     Latest Ref Rng & Units 03/02/2023    8:25 AM 03/01/2023    6:21 AM 02/28/2023   11:10 AM  CMP  Glucose 70 - 99 mg/dL 161  096  045   BUN 8 - 23 mg/dL 20  17  20    Creatinine 0.44 - 1.00 mg/dL 4.09  8.11  9.14   Sodium 135 - 145 mmol/L 140  139  137   Potassium 3.5 - 5.1 mmol/L 3.9  3.9  3.9   Chloride 98 - 111  mmol/L 104  100  99   CO2 22 - 32 mmol/L 28  29  29    Calcium 8.9 - 10.3 mg/dL 8.8  9.2  9.8   Total Protein 6.5 - 8.1 g/dL   8.5   Total Bilirubin 0.3 - 1.2 mg/dL   0.8   Alkaline Phos 38 - 126 U/L   50   AST 15 - 41 U/L   22   ALT 0 - 44 U/L   17       Latest Ref Rng & Units 03/04/2023   12:26 PM 03/02/2023    8:25 AM 03/01/2023    6:21 AM  CBC  WBC 4.0 - 10.5 K/uL 4.9  10.4  7.0   Hemoglobin 12.0 - 15.0 g/dL 78.2  95.6  21.3   Hematocrit 36.0 - 46.0 % 32.5  35.6  36.5   Platelets 150 - 400 K/uL 159  171  188     RADS: CLINICAL DATA:  Ileus.   EXAM: ABDOMEN - 1 VIEW   COMPARISON:  March 01, 2023.   FINDINGS: Distal tip of nasogastric tube is seen in proximal stomach. Contrast is noted in nondilated right colon. Mild small bowel dilatation is noted suggesting ileus or possibly distal small bowel obstruction. Midline surgical staples are noted.   IMPRESSION: Mild small bowel dilatation is noted suggesting ileus or possibly small  bowel obstruction     Electronically Signed   By: Lupita Raider M.D.   On: 03/04/2023 11:50 Assessment:   S/p ex-lap, LOA, primary repair of incisional hernia for SBO.  Imaging as noted above.  Clamp trial performed and minimal residual noted.  But due to lack of bowel function, will keep it clamped for the rest of the day and continue to monitor.   Encouarged gum, hard candy, ambulation  labs/images/medications/previous chart entries reviewed personally and relevant changes/updates noted above.

## 2023-03-05 DIAGNOSIS — K5651 Intestinal adhesions [bands], with partial obstruction: Secondary | ICD-10-CM | POA: Diagnosis not present

## 2023-03-05 LAB — BASIC METABOLIC PANEL
Anion gap: 8 (ref 5–15)
BUN: 12 mg/dL (ref 8–23)
CO2: 27 mmol/L (ref 22–32)
Calcium: 8.6 mg/dL — ABNORMAL LOW (ref 8.9–10.3)
Chloride: 102 mmol/L (ref 98–111)
Creatinine, Ser: 0.79 mg/dL (ref 0.44–1.00)
GFR, Estimated: >60 mL/min (ref >60)
Glucose, Bld: 127 mg/dL — ABNORMAL HIGH (ref 70–99)
Potassium: 3.4 mmol/L — ABNORMAL LOW (ref 3.5–5.1)
Sodium: 137 mmol/L (ref 135–145)

## 2023-03-05 NOTE — Progress Notes (Signed)
PROGRESS NOTE    Cressa Dissinger [place]  NFA:[id number] DOB: [birth year] DOA: 02/28/2023 PCP: Margarita Mail, DO    Brief Narrative:   72 y.o. female with medical history significant of hypertension, history of CVA who presents to the ED for evaluation of abdominal pain associated with intractable nausea and vomiting.  Started 1 day prior to admission.  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   No recent changes in diet.  No recent travel.  Pain occurred acutely, described as sharp in nature, right lower quadrant.  Vital signs stable.  CAT scan revealed partial mid to distal small bowel obstruction with evidence of paraesophageal hernia.   Surgery consulted by EDP.  NG tube deferred at this time as patient is not nauseous nor vomiting and appears comfortable.  Hospitalist contacted for admission.  10/18: Case discussed with general surgery.  Small bowel follow-through shows retained contrast in the stomach.  Physical exam is unchanged.  Plan for surgical exploration today.  10/19: Postop day 1 status post lysis of adhesions exploratory laparotomy with general surgery.  Tolerated procedure well.  10/21: Clamping trial done today with minimal residual.  Still awaiting return of bowel function.  10/22: Patient passing gas.  No BM yet.  Diet advanced to clears and NGT discontinued   Assessment & Plan:   Principal Problem:   Partial bowel obstruction (HCC) Active Problems:   SBO (small bowel obstruction) (HCC)  Small bowel obstruction Status post small bowel follow-through on 10/18 at midnight.  Retained contrast in the stomach with persistent abdominal pain and persistent vomiting Status post OR with general surgery 10/18.  Exploratory laparotomy and lysis of adhesions.  Tolerated procedure well. NGT discontinued and diet advanced to clears on 10/22 Plan: Continue  liquid diet Monitor for BM and return of bowel function General Surgery follow-up  History of CVA No acute issues   Essential hypertension Continue home amlodipine 5 mg daily Holding Diovan hydrochlorothiazide for now   History of colovesicular fistula Outpatient urology follow-up   DVT prophylaxis: SQ Lovenox Code Status: Full Family Communication: None Disposition Plan: Status is: Inpatient Remains inpatient appropriate because: SBO   Level of care: Med-Surg  Consultants:  General surgery  Procedures:  Exploratory laparotomy, lysis of adhesions 10/18  Antimicrobials: None    Subjective: Seen and examined.  Lying in bed.  More comfortable today after NGT discontinued.  Objective: Vitals:   03/04/23 1602 03/04/23 1939 03/05/23 0351 03/05/23 0906  BP: (!) 124/56 (!) 126/56 122/66 137/63  Pulse: 88 76 85 77  Resp: 18 18 18 17   Temp: 98 F (36.7 C) 97.7 F (36.5 C) 98.8 F (37.1 C) 98.2 F (36.8 C)  TempSrc: Oral Oral Oral Oral  SpO2: 99% 99% 100% 95%  Weight:      Height:        Intake/Output Summary (Last 24 hours) at 03/05/2023 1149 Last data filed at 03/05/2023 1043 Gross per 24 hour  Intake 720 ml  Output 0 ml  Net 720 ml    Filed Weights   02/28/23 1117 03/01/23 1154  Weight: 95.6 kg 95.6 kg    Examination:  General exam: NAD Respiratory system: Lungs clear.  Normal work of breathing.  Room air Cardiovascular system: S1-S2, RRR, no murmurs, no pedal edema Gastrointestinal system: Soft, NT/ND, hypoactive bowel sounds Central nervous system: Alert and oriented. No focal neurological deficits. Extremities: Symmetric 5 x 5 power. Skin: No  rashes, lesions or ulcers Psychiatry: Judgement and insight appear normal. Mood & affect appropriate.     Data Reviewed: I have personally reviewed following labs and imaging studies  CBC: Recent Labs  Lab 02/28/23 1110 03/01/23 0621 03/02/23 0825 03/04/23 1226  WBC 7.8 7.0 10.4 4.9  NEUTROABS   --   --  7.6 2.8  HGB 13.2 12.2 11.6* 10.8*  HCT 39.9 36.5 35.6* 32.5*  MCV 86.6 86.7 88.3 87.4  PLT 197 188 171 159   Basic Metabolic Panel: Recent Labs  Lab 02/28/23 1110 03/01/23 0621 03/02/23 0825 03/04/23 1226  NA 137 139 140 135  K 3.9 3.9 3.9 3.1*  CL 99 100 104 103  CO2 29 29 28 26   GLUCOSE 147* 154* 104* 117*  BUN 20 17 20 14   CREATININE 1.01* 0.98 0.99 0.78  CALCIUM 9.8 9.2 8.8* 8.2*   GFR: Estimated Creatinine Clearance: 69.5 mL/min (by C-G formula based on SCr of 0.78 mg/dL). Liver Function Tests: Recent Labs  Lab 02/28/23 1110  AST 22  ALT 17  ALKPHOS 50  BILITOT 0.8  PROT 8.5*  ALBUMIN 4.4   Recent Labs  Lab 02/28/23 1110  LIPASE 32   No results for input(s): "AMMONIA" in the last 168 hours. Coagulation Profile: No results for input(s): "INR", "PROTIME" in the last 168 hours. Cardiac Enzymes: No results for input(s): "CKTOTAL", "CKMB", "CKMBINDEX", "TROPONINI" in the last 168 hours. BNP (last 3 results) No results for input(s): "PROBNP" in the last 8760 hours. HbA1C: No results for input(s): "HGBA1C" in the last 72 hours. CBG: No results for input(s): "GLUCAP" in the last 168 hours. Lipid Profile: No results for input(s): "CHOL", "HDL", "LDLCALC", "TRIG", "CHOLHDL", "LDLDIRECT" in the last 72 hours. Thyroid Function Tests: No results for input(s): "TSH", "T4TOTAL", "FREET4", "T3FREE", "THYROIDAB" in the last 72 hours. Anemia Panel: No results for input(s): "VITAMINB12", "FOLATE", "FERRITIN", "TIBC", "IRON", "RETICCTPCT" in the last 72 hours. Sepsis Labs: No results for input(s): "PROCALCITON", "LATICACIDVEN" in the last 168 hours.  No results found for this or any previous visit (from the past 240 hour(s)).       Radiology Studies: DG Abd 1 View  Result Date: 03/04/2023 CLINICAL DATA:  Ileus. EXAM: ABDOMEN - 1 VIEW COMPARISON:  March 01, 2023. FINDINGS: Distal tip of nasogastric tube is seen in proximal stomach. Contrast is noted  in nondilated right colon. Mild small bowel dilatation is noted suggesting ileus or possibly distal small bowel obstruction. Midline surgical staples are noted. IMPRESSION: Mild small bowel dilatation is noted suggesting ileus or possibly small bowel obstruction Electronically Signed   By: Lupita Raider M.D.   On: 03/04/2023 11:50        Scheduled Meds:  amLODipine  5 mg Oral Daily   celecoxib  200 mg Oral Daily   enoxaparin (LOVENOX) injection  45 mg Subcutaneous Q24H   gabapentin  200 mg Oral BID   pantoprazole  40 mg Oral Daily   Continuous Infusions:      LOS: 4 days      Tresa Moore, MD [facility]   If 7PM-7AM, please contact night-coverage  03/05/2023, 11:49 AM

## 2023-03-05 NOTE — Progress Notes (Signed)
Subjective:  CC: Terri Werner is a 72 y.o. female  Hospital stay day 4, 4 Days Post-Op ex-lap, LOA, primary repair of incisional hernia for SBO  HPI: No acute complaints.  Now reports flatus but still no BM.  ROS:  General: Denies weight loss, weight gain, fatigue, fevers, chills, and night sweats. Heart: Denies chest pain, palpitations, racing heart, irregular heartbeat, leg pain or swelling, and decreased activity tolerance. Respiratory: Denies breathing difficulty, shortness of breath, wheezing, cough, and sputum. GI: Denies change in appetite, heartburn, nausea, vomiting, constipation, diarrhea, and blood in stool. GU: Denies difficulty urinating, pain with urinating, urgency, frequency, blood in urine.   Objective:   Temp:  [97.7 F (36.5 C)-98.8 F (37.1 C)] 98.2 F (36.8 C) (10/22 0906) Pulse Rate:  [76-88] 77 (10/22 0906) Resp:  [17-18] 17 (10/22 0906) BP: (122-137)/(56-66) 137/63 (10/22 0906) SpO2:  [95 %-100 %] 95 % (10/22 0906)     Height: 5\' 2"  (157.5 cm) Weight: 95.6 kg BMI (Calculated): 38.54   Intake/Output this shift:   Intake/Output Summary (Last 24 hours) at 03/05/2023 1255 Last data filed at 03/05/2023 1043 Gross per 24 hour  Intake 720 ml  Output 0 ml  Net 720 ml    Constitutional :  alert, cooperative, appears stated age, and no distress  Respiratory:  clear to auscultation bilaterally  Cardiovascular:  regular rate and rhythm  Gastrointestinal: Soft, no guarding, TTP around incision site improved.  Decreased distention .   Skin: Cool and moist. Prevena intact.  Psychiatric: Normal affect, non-agitated, not confused       LABS:     Latest Ref Rng & Units 03/04/2023   12:26 PM 03/02/2023    8:25 AM 03/01/2023    6:21 AM  CMP  Glucose 70 - 99 mg/dL 161  096  045   BUN 8 - 23 mg/dL 14  20  17    Creatinine 0.44 - 1.00 mg/dL 4.09  8.11  9.14   Sodium 135 - 145 mmol/L 135  140  139   Potassium 3.5 - 5.1 mmol/L 3.1  3.9  3.9    Chloride 98 - 111 mmol/L 103  104  100   CO2 22 - 32 mmol/L 26  28  29    Calcium 8.9 - 10.3 mg/dL 8.2  8.8  9.2       Latest Ref Rng & Units 03/04/2023   12:26 PM 03/02/2023    8:25 AM 03/01/2023    6:21 AM  CBC  WBC 4.0 - 10.5 K/uL 4.9  10.4  7.0   Hemoglobin 12.0 - 15.0 g/dL 78.2  95.6  21.3   Hematocrit 36.0 - 46.0 % 32.5  35.6  36.5   Platelets 150 - 400 K/uL 159  171  188     RADS: CLINICAL DATA:  Ileus.   EXAM: ABDOMEN - 1 VIEW   COMPARISON:  March 01, 2023.   FINDINGS: Distal tip of nasogastric tube is seen in proximal stomach. Contrast is noted in nondilated right colon. Mild small bowel dilatation is noted suggesting ileus or possibly distal small bowel obstruction. Midline surgical staples are noted.   IMPRESSION: Mild small bowel dilatation is noted suggesting ileus or possibly small bowel obstruction     Electronically Signed   By: Lupita Raider M.D.   On: 03/04/2023 11:50 Assessment:   S/p ex-lap, LOA, primary repair of incisional hernia for SBO.  Tolerated clamp trial all day yesterday and now reports passing flatus.  NG pulled and  clear started.  labs/images/medications/previous chart entries reviewed personally and relevant changes/updates noted above.

## 2023-03-06 LAB — BASIC METABOLIC PANEL
Anion gap: 8 (ref 5–15)
BUN: 11 mg/dL (ref 8–23)
CO2: 27 mmol/L (ref 22–32)
Calcium: 8.8 mg/dL — ABNORMAL LOW (ref 8.9–10.3)
Chloride: 103 mmol/L (ref 98–111)
Creatinine, Ser: 0.79 mg/dL (ref 0.44–1.00)
GFR, Estimated: >60 mL/min (ref >60)
Glucose, Bld: 110 mg/dL — ABNORMAL HIGH (ref 70–99)
Potassium: 4 mmol/L (ref 3.5–5.1)
Sodium: 138 mmol/L (ref 135–145)

## 2023-03-06 MED ORDER — IBUPROFEN 800 MG PO TABS: 800.0000 mg | ORAL_TABLET | Freq: Three times a day (TID) | ORAL | 0 refills | Status: DC | PRN

## 2023-03-06 MED ORDER — POTASSIUM CHLORIDE 20 MEQ PO PACK
40.0000 meq | PACK | Freq: Once | ORAL | Status: AC
  Administered 2023-03-06: 40 meq via ORAL
  Filled 2023-03-06: qty 2

## 2023-03-06 MED ORDER — HYDROCODONE-ACETAMINOPHEN 5-325 MG PO TABS: 1.0000 | ORAL_TABLET | Freq: Four times a day (QID) | ORAL | 0 refills | Status: DC | PRN

## 2023-03-06 MED ORDER — POLYETHYLENE GLYCOL 3350 17 G PO PACK: 34.0000 g | PACK | Freq: Once | ORAL | Status: DC

## 2023-03-06 MED ORDER — DOCUSATE SODIUM 100 MG PO CAPS
100.0000 mg | ORAL_CAPSULE | Freq: Two times a day (BID) | ORAL | 0 refills | Status: AC | PRN
Start: 2023-03-06 — End: 2023-03-16

## 2023-03-06 MED ORDER — ACETAMINOPHEN 325 MG PO TABS
650.0000 mg | ORAL_TABLET | Freq: Three times a day (TID) | ORAL | 0 refills | Status: AC | PRN
Start: 2023-03-06 — End: 2023-04-05

## 2023-03-06 NOTE — Care Management Important Message (Signed)
Important Message  Patient Details  Name: Terri Werner MRN: [id number] Date of Birth: Feb 21, 1951   Important Message Given:  Yes - Medicare IM     Truddie Hidden, RN 03/06/2023, 2:48 PM

## 2023-03-06 NOTE — Progress Notes (Signed)
Subjective:  CC: Terri Werner is a 72 y.o. female  Hospital stay day 5, 5 Days Post-Op ex-lap, LOA, primary repair of incisional hernia for SBO  HPI: No acute complaints.  Now reports flatus but still no BM. Tolerating clears  ROS:  General: Denies weight loss, weight gain, fatigue, fevers, chills, and night sweats. Heart: Denies chest pain, palpitations, racing heart, irregular heartbeat, leg pain or swelling, and decreased activity tolerance. Respiratory: Denies breathing difficulty, shortness of breath, wheezing, cough, and sputum. GI: Denies change in appetite, heartburn, nausea, vomiting, constipation, diarrhea, and blood in stool. GU: Denies difficulty urinating, pain with urinating, urgency, frequency, blood in urine.   Objective:   Temp:  [98.2 F (36.8 C)-98.4 F (36.9 C)] 98.4 F (36.9 C) (10/23 0357) Pulse Rate:  [71-82] 71 (10/23 0357) Resp:  [17-18] 18 (10/23 0357) BP: (116-137)/(63-69) 116/69 (10/23 0357) SpO2:  [95 %-99 %] 99 % (10/23 0357)     Height: 5\' 2"  (157.5 cm) Weight: 95.6 kg BMI (Calculated): 38.54   Intake/Output this shift:   Intake/Output Summary (Last 24 hours) at 03/06/2023 0729 Last data filed at 03/05/2023 1949 Gross per 24 hour  Intake 900 ml  Output 0 ml  Net 900 ml    Constitutional :  alert, cooperative, appears stated age, and no distress  Respiratory:  clear to auscultation bilaterally  Cardiovascular:  regular rate and rhythm  Gastrointestinal: Soft, no guarding, TTP around incision site improved.  Decreased distention .   Skin: Cool and moist. Prevena intact.  Psychiatric: Normal affect, non-agitated, not confused       LABS:     Latest Ref Rng & Units 03/05/2023   10:04 PM 03/04/2023   12:26 PM 03/02/2023    8:25 AM  CMP  Glucose 70 - 99 mg/dL 578  469  629   BUN 8 - 23 mg/dL 12  14  20    Creatinine 0.44 - 1.00 mg/dL 5.28  4.13  2.44   Sodium 135 - 145 mmol/L 137  135  140   Potassium 3.5 - 5.1 mmol/L 3.4   3.1  3.9   Chloride 98 - 111 mmol/L 102  103  104   CO2 22 - 32 mmol/L 27  26  28    Calcium 8.9 - 10.3 mg/dL 8.6  8.2  8.8       Latest Ref Rng & Units 03/04/2023   12:26 PM 03/02/2023    8:25 AM 03/01/2023    6:21 AM  CBC  WBC 4.0 - 10.5 K/uL 4.9  10.4  7.0   Hemoglobin 12.0 - 15.0 g/dL 01.0  27.2  53.6   Hematocrit 36.0 - 46.0 % 32.5  35.6  36.5   Platelets 150 - 400 K/uL 159  171  188     RADS: CLINICAL DATA:  Ileus.   EXAM: ABDOMEN - 1 VIEW   COMPARISON:  March 01, 2023.   FINDINGS: Distal tip of nasogastric tube is seen in proximal stomach. Contrast is noted in nondilated right colon. Mild small bowel dilatation is noted suggesting ileus or possibly distal small bowel obstruction. Midline surgical staples are noted.   IMPRESSION: Mild small bowel dilatation is noted suggesting ileus or possibly small bowel obstruction     Electronically Signed   By: Lupita Raider M.D.   On: 03/04/2023 11:50 Assessment:   S/p ex-lap, LOA, primary repair of incisional hernia for SBO.  Tolerating clears with continued flatus.  No complaints of distention, nausea.  Advance to  diet and continue to monitor.  Maybe d/c once BM is recorded  labs/images/medications/previous chart entries reviewed personally and relevant changes/updates noted above.

## 2023-03-06 NOTE — Plan of Care (Signed)

## 2023-03-06 NOTE — Discharge Instructions (Signed)
Exploratory laparotomy, Care After This sheet gives you information about how to care for yourself after your procedure. Your health care provider may also give you more specific instructions. If you have problems or questions, contact your health care provider. What can I expect after the procedure? After your procedure, it is common to have the following: Pain in your abdomen, especially in the incision areas. You will be given medicine to control the pain. Tiredness. This is a normal part of the recovery process. Your energy level will return to normal over the next several weeks. Changes in your bowel movements, such as constipation or needing to go more often. Talk with your health care provider about how to manage this. Follow these instructions at home: Medicines  tylenol and advil as needed for discomfort.  Please alternate between the two every four hours as needed for pain.    Use narcotics, if prescribed, only when tylenol and motrin is not enough to control pain.  325-650mg  every 8hrs to max of 4000mg /24hrs (including the 325mg  in every norco dose) for the tylenol.    Advil up to 800mg  per dose every 8hrs as needed for pain.   Do not drive or use heavy machinery while taking prescription pain medicine. Do not drink alcohol while taking prescription pain medicine. If you were prescribed an antibiotic medicine, use it as told by your health care provider. Do not stop using the antibiotic even if you start to feel better. Incision care    Follow instructions from your health care provider about how to take care of your incision areas. Make sure you: Keep your incisions clean and dry. Wash your hands with soap and water before and after applying medicine to the areas, and before and after changing your bandage (dressing). If soap and water are not available, use hand sanitizer. Change your dressing as told by your health care provider. Leave stitches (sutures), skin glue, or adhesive  strips in place. These skin closures may need to stay in place for 2 weeks or longer. If adhesive strip edges start to loosen and curl up, you may trim the loose edges. Do not remove adhesive strips completely unless your health care provider tells you to do that. Do not wear tight clothing over the incisions. Tight clothing may rub and irritate the incision areas, which may cause the incisions to open. Do not take baths, swim, or use a hot tub until your health care provider approves. OK TO SHOWER.   Check your incision area every day for signs of infection. Check for: More redness, swelling, or pain. More fluid or blood. Warmth. Pus or a bad smell. Activity Avoid lifting anything that is heavier than 10 lb (4.5 kg) for 2 weeks or until your health care provider says it is okay. You may resume normal activities as told by your health care provider. Ask your health care provider what activities are safe for you. Take rest breaks during the day as needed. Eating and drinking Follow instructions from your health care provider about what you can eat after surgery. To prevent or treat constipation while you are taking prescription pain medicine, your health care provider may recommend that you: Drink enough fluid to keep your urine clear or pale yellow. Take over-the-counter or prescription medicines. Eat foods that are high in fiber, such as fresh fruits and vegetables, whole grains, and beans. Limit foods that are high in fat and processed sugars, such as fried and sweet foods. General instructions Ask your  health care provider when you will need an appointment to get your sutures or staples removed. Keep all follow-up visits as told by your health care provider. This is important. Contact a health care provider if: You have more redness, swelling, or pain around your incisions. You have more fluid or blood coming from the incisions. Your incisions feel warm to the touch. You have pus or a  bad smell coming from your incisions or your dressing. You have a fever. You have an incision that breaks open (edges not staying together) after sutures or staples have been removed. Get help right away if: You develop a rash. You have chest pain or difficulty breathing. You have pain or swelling in your legs. You feel light-headed or you faint. Your abdomen swells (becomes distended). You have nausea or vomiting. You have blood in your stool (feces). This information is not intended to replace advice given to you by your health care provider. Make sure you discuss any questions you have with your health care provider. Document Released: 11/17/2004 Document Revised: 01/17/2018 Document Reviewed: 01/30/2016 Elsevier Interactive Patient Education  2019 [employer].

## 2023-03-06 NOTE — Plan of Care (Signed)
The patient has been discharged. IV has been removed. Education has been completed with the patient.  Problem: Education: Goal: Knowledge of General Education information will improve Description: Including pain rating scale, medication(s)/side effects and non-pharmacologic comfort measures Outcome: Completed/Met   Problem: Health Behavior/Discharge Planning: Goal: Ability to manage health-related needs will improve Outcome: Completed/Met   Problem: Clinical Measurements: Goal: Ability to maintain clinical measurements within normal limits will improve Outcome: Completed/Met Goal: Will remain free from infection Outcome: Completed/Met Goal: Diagnostic test results will improve Outcome: Completed/Met Goal: Respiratory complications will improve Outcome: Completed/Met Goal: Cardiovascular complication will be avoided Outcome: Completed/Met   Problem: Activity: Goal: Risk for activity intolerance will decrease Outcome: Completed/Met   Problem: Nutrition: Goal: Adequate nutrition will be maintained Outcome: Completed/Met   Problem: Coping: Goal: Level of anxiety will decrease Outcome: Completed/Met   Problem: Elimination: Goal: Will not experience complications related to bowel motility Outcome: Completed/Met Goal: Will not experience complications related to urinary retention Outcome: Completed/Met   Problem: Pain Managment: Goal: General experience of comfort will improve Outcome: Completed/Met   Problem: Safety: Goal: Ability to remain free from injury will improve Outcome: Completed/Met   Problem: Skin Integrity: Goal: Risk for impaired skin integrity will decrease Outcome: Completed/Met

## 2023-03-06 NOTE — TOC Progression Note (Signed)
Transition of Care [facility]) - Progression Note    Patient Details  Name: Terri Werner MRN: [id number] Date of Birth: 07-16-1950  [facility]) CM/SW Contact  Truddie Hidden, RN Phone Number: 03/06/2023, 9:34 AM  Clinical Narrative:    TOC continuing to follow patient's progress throughout discharge planning.        Expected Discharge Plan and Services                                               Social Determinants of Health (SDOH) Interventions SDOH Screenings   Food Insecurity: No Food Insecurity (02/28/2023)  Housing: Low Risk  (02/28/2023)  Transportation Needs: No Transportation Needs (02/28/2023)  Utilities: Not At Risk (02/28/2023)  Alcohol Screen: Low Risk  (07/19/2022)  Depression (PHQ2-9): Low Risk  (02/14/2023)  Financial Resource Strain: Medium Risk (08/20/2022)  Physical Activity: Sufficiently Active (08/20/2022)  Social Connections: Moderately Integrated (08/20/2022)  Stress: No Stress Concern Present (08/20/2022)  Tobacco Use: Low Risk  (03/01/2023)    Readmission Risk Interventions     No data to display

## 2023-03-06 NOTE — Discharge Summary (Signed)
Terri Werner KGM:[id number] DOB: [birth year] DOA: 02/28/2023  PCP: Margarita Mail, DO  Admit date: 02/28/2023 Discharge date: 03/06/2023  Time spent: 35 minutes  Recommendations for Outpatient Follow-up:  Pcp f/u Gen surg f/u 1 week     Discharge Diagnoses:  Principal Problem:   Partial bowel obstruction (HCC) Active Problems:   Hypertension, benign   SBO (small bowel obstruction) (HCC)   Discharge Condition: stable  Diet recommendation: heart healthy  Filed Weights   02/28/23 1117 03/01/23 1154  Weight: 95.6 kg 95.6 kg    History of present illness:  From admission h and p Terri Werner is a 72 y.o. female with medical history significant of hypertension, history of CVA who presents to the ED for evaluation of abdominal pain associated with intractable nausea and vomiting.  Started 1 day prior to admission.  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   No recent changes in diet.  No recent travel.  Pain occurred acutely, described as sharp in nature, right lower quadrant.  Vital signs stable.  CAT scan revealed partial mid to distal small bowel obstruction with evidence of paraesophageal hernia.   Surgery consulted by EDP.  NG tube deferred at this time as patient is not nauseous nor vomiting and appears comfortable.  Hospice contacted for admission.  Hospital Course:  Patient presents with abdominal pain, nausea, and vomiting. Found to have small bowel obstruction. Taken for ex-lap with general surgery on 10/18 with lysis of adhesions and primary repair of incisional hernia. Patient is now ambulating without difficulty, tolerating solid foods, and having regular bowel movements, with pain controlled. Has prevena wound vac and knows this can be removed when battery dies. Plan will be general surgery f/u next week, also advising pcp f/u.    Procedures: See above   Consultations: Gen surg  Discharge Exam: Vitals:   03/06/23 0357 03/06/23 0802  BP: 116/69 126/80  Pulse: 71 76  Resp: 18 18  Temp: 98.4 F (36.9 C) 99.3 F (37.4 C)  SpO2: 99% 100%    General: NAD Cardiovascular: RRR Respiratory: CTAB Abdomen: midline wound vac in place, mild tenderness around incision, normal bowel sounds  Discharge Instructions   Discharge Instructions     Diet - low sodium heart healthy   Complete by: As directed    Discharge wound care:   Complete by: As directed    Remove dressing when wound vacuum stops working   Increase activity slowly   Complete by: As directed       Allergies as of 03/06/2023       Reactions   Latex Itching, Rash, Anaphylaxis   Burning   Shellfish-derived Products Anaphylaxis   Shrimp [shellfish Allergy] Swelling   Lips will burn        Medication List     TAKE these medications    acetaminophen 325 MG tablet Commonly known as: TYLENOL Take 2 tablets (650 mg total) by mouth every 4 (four) hours as needed for mild pain (or temp > 37.5 C (99.5 F)). What changed: Another medication with the same name was added. Make sure you understand how and when to take each.   acetaminophen 325 MG tablet Commonly known as: Tylenol Take 2 tablets (650 mg total) by mouth every 8 (eight) hours as needed for mild pain (pain score 1-3). What changed: You were already taking a medication with the same name, and this prescription was  added. Make sure you understand how and when to take each.   amLODipine 5 MG tablet Commonly known as: NORVASC Take 1 tablet (5 mg total) by mouth daily.   cyclobenzaprine 5 MG tablet Commonly known as: FLEXERIL Take 1 tablet (5 mg total) by mouth at bedtime as needed for muscle spasms.   docusate sodium 100 MG capsule Commonly known as: Colace Take 1 capsule (100 mg total) by mouth 2 (two) times daily as needed for up to 10 days for mild constipation.    HYDROcodone-acetaminophen 5-325 MG tablet Commonly known as: Norco Take 1 tablet by mouth every 6 (six) hours as needed for up to 15 doses for moderate pain (pain score 4-6).   ibuprofen 800 MG tablet Commonly known as: ADVIL Take 1 tablet (800 mg total) by mouth every 8 (eight) hours as needed for mild pain (pain score 1-3) or moderate pain (pain score 4-6).   lidocaine 5 % ointment Commonly known as: XYLOCAINE Apply 1 Application topically as needed.   polyethylene glycol powder 17 GM/SCOOP powder Commonly known as: GLYCOLAX/MIRALAX Take 17 g by mouth daily as needed for moderate constipation.   rosuvastatin 10 MG tablet Commonly known as: Crestor Take 1 tablet (10 mg total) by mouth daily.   valsartan-hydrochlorothiazide 80-12.5 MG tablet Commonly known as: DIOVAN-HCT Take 1 tablet by mouth daily.               Discharge Care Instructions  (From admission, onward)           Start     Ordered   03/06/23 0000  Discharge wound care:       Comments: Remove dressing when wound vacuum stops working   03/06/23 1421           Allergies  Allergen Reactions   Latex Itching, Rash and Anaphylaxis    Burning   Shellfish-Derived Products Anaphylaxis   Shrimp [Shellfish Allergy] Swelling    Lips will burn     Follow-up Information     Sakai, Isami, DO Follow up in 1 week(s).   Specialties: General Surgery, Surgery Why: post op ex lap Contact information: [address] [place] [address] [phone]         Margarita Mail, DO Follow up.   Specialty: Internal Medicine Contact information: [address] [place] [address] [phone]                  The results of significant diagnostics from this hospitalization (including imaging, microbiology, ancillary and laboratory) are listed below for reference.    Significant Diagnostic Studies: DG Abd 1 View  Result Date: 03/04/2023 CLINICAL DATA:  Ileus.  EXAM: ABDOMEN - 1 VIEW COMPARISON:  March 01, 2023. FINDINGS: Distal tip of nasogastric tube is seen in proximal stomach. Contrast is noted in nondilated right colon. Mild small bowel dilatation is noted suggesting ileus or possibly distal small bowel obstruction. Midline surgical staples are noted. IMPRESSION: Mild small bowel dilatation is noted suggesting ileus or possibly small bowel obstruction Electronically Signed   By: Lupita Raider M.D.   On: 03/04/2023 11:50   DG Abd Portable 1V-Small Bowel Obstruction Protocol-initial, 8 hr delay  Result Date: 03/01/2023 CLINICAL DATA:  8 hour delay for small-bowel obstruction EXAM: PORTABLE ABDOMEN - 1 VIEW COMPARISON:  CT abdomen and pelvis 02/28/2023 FINDINGS: Contrast material is demonstrated in the stomach. No contrast material is seen in the small bowel or colon. Continued delayed imaging is recommended for evaluation of small-bowel  transit. No gaseous distention of large or small bowel. Scattered gas and stool throughout the colon. IMPRESSION: Contrast material remains in the stomach. Continued delayed imaging is recommended for evaluation of small-bowel transit. Electronically Signed   By: Burman Nieves M.D.   On: 03/01/2023 00:35   CT ABDOMEN PELVIS W CONTRAST  Result Date: 02/28/2023 CLINICAL DATA:  Right lower quadrant abdominal pain since 2 a.m. EXAM: CT ABDOMEN AND PELVIS WITH CONTRAST TECHNIQUE: Multidetector CT imaging of the abdomen and pelvis was performed using the standard protocol following bolus administration of intravenous contrast. RADIATION DOSE REDUCTION: This exam was performed according to the departmental dose-optimization program which includes automated exposure control, adjustment of the mA and/or kV according to patient size and/or use of iterative reconstruction technique. CONTRAST:  OMNIPAQUE IOHEXOL 300 MG/ML  SOLN COMPARISON:  10/10/2021 CT abdomen/pelvis FINDINGS: Lower chest: No significant pulmonary nodules or  acute consolidative airspace disease. Coronary atherosclerosis. Hepatobiliary: Normal liver. Subcentimeter hypodense lateral segment left liver lesion, too small to characterize, unchanged, considered benign. No new liver lesions. Normal gallbladder with no radiopaque cholelithiasis. No biliary ductal dilatation. Pancreas: Normal, with no mass or duct dilation. Spleen: Normal size. No mass. Adrenals/Urinary Tract: Normal adrenals. No hydronephrosis. Scattered subcentimeter hypodense bilateral renal cortical lesions, too small to characterize, for which no follow-up imaging is recommended. Normal bladder. Stomach/Bowel: Small hiatal hernia. Otherwise normal nondistended stomach. Several mildly dilated fluid-filled mid small bowel loops located in the central lower and right abdomen measuring up to 3.4 cm diameter. Distal small bowel is collapsed. Suggestion of a focal small bowel kink and caliber transition in the right lower quadrant (series 5/image 43). No bowel pneumatosis or definite small bowel wall thickening. Appendectomy. Cecum appears mobile and located in mid to upper right abdomen. Chronic large left periumbilical hernia containing a portion of the transverse colon. Moderate left colonic diverticulosis. No large bowel wall thickening, focal large bowel caliber transition or significant pericolonic fat stranding. Vascular/Lymphatic: Atherosclerotic nonaneurysmal abdominal aorta. Patent portal, splenic, hepatic and renal veins. No pathologically enlarged lymph nodes in the abdomen or pelvis. Reproductive: Status post hysterectomy, with no abnormal findings at the vaginal cuff. No adnexal mass. Other: No pneumoperitoneum. Trace perihepatic ascites. No focal fluid collection. Musculoskeletal: No aggressive appearing focal osseous lesions. Moderate thoracolumbar spondylosis. IMPRESSION: 1. CT findings are compatible with a partial mid to distal small bowel obstruction, potentially due to adhesions. No bowel  pneumatosis, definite small bowel wall thickening or pneumoperitoneum. 2. Chronic large left periumbilical hernia containing a portion of the transverse colon without findings to suggest large bowel obstruction. 3. Trace perihepatic ascites. 4. Small hiatal hernia. 5. Moderate left colonic diverticulosis. 6. Coronary atherosclerosis. 7.  Aortic Atherosclerosis (ICD10-I70.0). Electronically Signed   By: Delbert Phenix M.D.   On: 02/28/2023 14:51    Microbiology: No results found for this or any previous visit (from the past 240 hour(s)).   Labs: Basic Metabolic Panel: Recent Labs  Lab 03/01/23 0621 03/02/23 0825 03/04/23 1226 03/05/23 2204 03/06/23 0829  NA 139 140 135 137 138  K 3.9 3.9 3.1* 3.4* 4.0  CL 100 104 103 102 103  CO2 29 28 26 27 27   GLUCOSE 154* 104* 117* 127* 110*  BUN 17 20 14 12 11   CREATININE 0.98 0.99 0.78 0.79 0.79  CALCIUM 9.2 8.8* 8.2* 8.6* 8.8*   Liver Function Tests: Recent Labs  Lab 02/28/23 1110  AST 22  ALT 17  ALKPHOS 50  BILITOT 0.8  PROT 8.5*  ALBUMIN 4.4  Recent Labs  Lab 02/28/23 1110  LIPASE 32   No results for input(s): "AMMONIA" in the last 168 hours. CBC: Recent Labs  Lab 02/28/23 1110 03/01/23 0621 03/02/23 0825 03/04/23 1226  WBC 7.8 7.0 10.4 4.9  NEUTROABS  --   --  7.6 2.8  HGB 13.2 12.2 11.6* 10.8*  HCT 39.9 36.5 35.6* 32.5*  MCV 86.6 86.7 88.3 87.4  PLT 197 188 171 159   Cardiac Enzymes: No results for input(s): "CKTOTAL", "CKMB", "CKMBINDEX", "TROPONINI" in the last 168 hours. BNP: BNP (last 3 results) No results for input(s): "BNP" in the last 8760 hours.  ProBNP (last 3 results) No results for input(s): "PROBNP" in the last 8760 hours.  CBG: No results for input(s): "GLUCAP" in the last 168 hours.     Signed:  Silvano Bilis MD.  [facility] 03/06/2023, 2:22 PM

## 2023-03-07 ENCOUNTER — Telehealth: Payer: Self-pay

## 2023-03-07 NOTE — Transitions of Care (Post Inpatient/ED Visit) (Signed)
03/07/2023  Name: Terri Werner MRN: [id number] DOB: 10-10-1950  Today's TOC FU Call Status: Today's TOC FU Call Status:: Successful TOC FU Call Completed TOC FU Call Complete Date: 03/07/23 Patient's Name and Date of Birth confirmed.  Transition Care Management Follow-up Telephone Call Date of Discharge: 03/06/23 Discharge Facility: [facility]) Type of Discharge: Inpatient Admission Primary Inpatient Discharge Diagnosis:: RLQ abdominal pain/small bowel obstruction How have you been since you were released from the hospital?: Better Any questions or concerns?: No  Items Reviewed: Did you receive and understand the discharge instructions provided?: Yes Medications obtained,verified, and reconciled?: Yes (Medications Reviewed) Any new allergies since your discharge?: No Dietary orders reviewed?: Yes Type of Diet Ordered:: low sodium Do you have support at home?: Yes People in Home: grandchild(ren), child(ren), adult, sibling(s)  Medications Reviewed Today: Medications Reviewed Today     Reviewed by Rosealynn Mateus, Jordan Hawks, CMA (Certified Medical Assistant) on 03/07/23 at 1004  Med List Status: <None>   Medication Order Taking? Sig Documenting Provider Last Dose Status Informant  acetaminophen (TYLENOL) 325 MG tablet [id number] Yes Take 2 tablets (650 mg total) by mouth every 4 (four) hours as needed for mild pain (or temp > 37.5 C (99.5 F)). Charlton Amor, PA-C Taking Active Self  acetaminophen (TYLENOL) 325 MG tablet [id number] Yes Take 2 tablets (650 mg total) by mouth every 8 (eight) hours as needed for mild pain (pain score 1-3). Sung Amabile, DO Taking Active   amLODipine (NORVASC) 5 MG tablet [id number] Yes Take 1 tablet (5 mg total) by mouth daily. Margarita Mail, DO Taking Active Self  cyclobenzaprine (FLEXERIL) 5 MG tablet [id number] Yes Take 1 tablet (5 mg total) by mouth at bedtime as needed for muscle spasms. Margarita Mail, DO  Taking Active Self  docusate sodium (COLACE) 100 MG capsule [id number] Yes Take 1 capsule (100 mg total) by mouth 2 (two) times daily as needed for up to 10 days for mild constipation. Sung Amabile, DO Taking Active   HYDROcodone-acetaminophen (NORCO) 5-325 MG tablet [id number] Yes Take 1 tablet by mouth every 6 (six) hours as needed for up to 15 doses for moderate pain (pain score 4-6). Sung Amabile, DO Taking Active   ibuprofen (ADVIL) 800 MG tablet [id number] Yes Take 1 tablet (800 mg total) by mouth every 8 (eight) hours as needed for mild pain (pain score 1-3) or moderate pain (pain score 4-6). Sung Amabile, DO Taking Active   lidocaine (XYLOCAINE) 5 % ointment [id number] Yes Apply 1 Application topically as needed. Margarita Mail, DO Taking Active Self  polyethylene glycol powder (GLYCOLAX/MIRALAX) 17 GM/SCOOP powder [id number] Yes Take 17 g by mouth daily as needed for moderate constipation. Margarita Mail, DO Taking Active Self  rosuvastatin (CRESTOR) 10 MG tablet [id number] Yes Take 1 tablet (10 mg total) by mouth daily. Margarita Mail, DO Taking Active Self  valsartan-hydrochlorothiazide (DIOVAN-HCT) 80-12.5 MG tablet [id number] Yes Take 1 tablet by mouth daily. Margarita Mail, DO Taking Active Self  Med List Note Myrtis Ser 05/27/21 1943): Marland Kitchen            Home Care and Equipment/Supplies: Were Home Health Services Ordered?: NA Any new equipment or medical supplies ordered?: NA  Functional Questionnaire: Do you need assistance with bathing/showering or dressing?: No Do you need assistance with meal preparation?: No Do you need assistance with eating?: No Do you have difficulty maintaining continence: No Do you need assistance with getting out of bed/getting out of a chair/moving?: Yes (patient uses  cane due to stroke in 2023) Do you have difficulty managing or taking your medications?: No  Follow up appointments reviewed: PCP Follow-up appointment  confirmed?: Yes Date of PCP follow-up appointment?: 03/14/23 Follow-up Provider: Margarita Mail, DO Specialist Hospital Follow-up appointment confirmed?: Yes Date of Specialist follow-up appointment?: 03/13/23 Follow-Up [facility] Do you need transportation to your follow-up appointment?: No Do you understand care options if your condition(s) worsen?: Yes-patient verbalized understanding    Abby Melvern Ramone, CMA  [facility] AWV Team Direct Dial: [phone]

## 2023-03-07 NOTE — Transitions of Care (Post Inpatient/ED Visit) (Signed)
03/07/2023  Name: Terri Werner MRN: [id number] DOB: 24-May-1950  Today's TOC FU Call Status: TOC FU Call Complete Date: 03/07/23 Patient's Name and Date of Birth confirmed.  Transition Care Management Follow-up Telephone Call Date of Discharge: 03/06/23 Discharge Facility: [facility]) Type of Discharge: Inpatient Admission Primary Inpatient Discharge Diagnosis:: RLQ abdonmial pain w/ SBO How have you been since you were released from the hospital?: Better Any questions or concerns?: No  Items Reviewed: Did you receive and understand the discharge instructions provided?: Yes Medications obtained,verified, and reconciled?: Yes (Medications Reviewed) Any new allergies since your discharge?: No Dietary orders reviewed?: Yes Type of Diet Ordered:: Reg NAS Do you have support at home?: Yes People in Home: grandchild(ren)  Medications Reviewed Today: Medications Reviewed Today     Reviewed by Johnnette Barrios, Terri Werner (Registered Nurse) on 03/07/23 at 1258  Med List Status: <None>   Medication Order Taking? Sig Documenting Provider Last Dose Status Informant  acetaminophen (TYLENOL) 325 MG tablet [id number] No Take 2 tablets (650 mg total) by mouth every 4 (four) hours as needed for mild pain (or temp > 37.5 C (99.5 F)). Charlton Amor, PA-C Taking Active Self  acetaminophen (TYLENOL) 325 MG tablet [id number] No Take 2 tablets (650 mg total) by mouth every 8 (eight) hours as needed for mild pain (pain score 1-3). Sung Amabile, DO Taking Active   amLODipine (NORVASC) 5 MG tablet [id number] No Take 1 tablet (5 mg total) by mouth daily. Margarita Mail, DO Taking Active Self  cyclobenzaprine (FLEXERIL) 5 MG tablet [id number] No Take 1 tablet (5 mg total) by mouth at bedtime as needed for muscle spasms. Margarita Mail, DO Taking Active Self  docusate sodium (COLACE) 100 MG capsule [id number] No Take 1 capsule (100 mg total) by mouth 2 (two) times  daily as needed for up to 10 days for mild constipation. Sung Amabile, DO Taking Active   HYDROcodone-acetaminophen (NORCO) 5-325 MG tablet [id number] No Take 1 tablet by mouth every 6 (six) hours as needed for up to 15 doses for moderate pain (pain score 4-6). Sung Amabile, DO Taking Active   ibuprofen (ADVIL) 800 MG tablet [id number] No Take 1 tablet (800 mg total) by mouth every 8 (eight) hours as needed for mild pain (pain score 1-3) or moderate pain (pain score 4-6). Sung Amabile, DO Taking Active   lidocaine (XYLOCAINE) 5 % ointment [id number] No Apply 1 Application topically as needed. Margarita Mail, DO Taking Active Self  polyethylene glycol powder (GLYCOLAX/MIRALAX) 17 GM/SCOOP powder [id number] No Take 17 g by mouth daily as needed for moderate constipation. Margarita Mail, DO Taking Active Self  rosuvastatin (CRESTOR) 10 MG tablet [id number] No Take 1 tablet (10 mg total) by mouth daily. Margarita Mail, DO Taking Active Self  valsartan-hydrochlorothiazide (DIOVAN-HCT) 80-12.5 MG tablet [id number] No Take 1 tablet by mouth daily. Margarita Mail, DO Taking Active Self  Med List Note Myrtis Ser 05/27/21 1943): Marland Kitchen            Home Care and Equipment/Supplies: Were Home Health Services Ordered?: No Any new equipment or medical supplies ordered?: No  Functional Questionnaire: Do you need assistance with bathing/showering or dressing?: No Do you need assistance with meal preparation?: No Do you need assistance with eating?: No Do you have difficulty maintaining continence: No Do you need assistance with getting out of bed/getting out of a chair/moving?: No Do you have difficulty managing or taking your medications?: No  Follow up appointments reviewed: PCP  Follow-up appointment confirmed?: Yes Date of PCP follow-up appointment?: 03/14/23 Follow-up Provider: Luana Shu Follow-up appointment confirmed?: Yes Date of Specialist  follow-up appointment?: 03/13/23 Follow-Up Specialty Provider:: Dr Tonna [facility] Do you need transportation to your follow-up appointment?: No (She does not drive but has a ride) Do you understand care options if your condition(s) worsen?: Yes-patient verbalized understanding Routine follow-up and on-going assessment evaluation and education of disease processes, recommended interventions for both chronic and acute medical conditions , will occur during each weekly visit along with ongoing review of symptoms ,medication reviews and reconciliation. Any updates , inconsistencies, discrepancies or acute care concerns will be addressed and routed to the correct Practitioner if indicated   Please refer to Care Plan for goals and interventions -Effectiveness of interventions, symptom management and outcomes will be evaluated  weekly during [facility] 30-day Program Outreach calls  . Any necessary  changes and updates to Care Plan will be completed episodically    Reviewed goals for care  Patient provided with Contact information and verbalized understanding with current POC.    Terri Werner , BSN, Terri Werner Care Management Coordinator [place]   [facility] [email or web address] Direct Dial: [phone]

## 2023-03-12 NOTE — Progress Notes (Unsigned)
Established Patient Office Visit  Subjective:  Patient ID: LEMON SUPPA, female    DOB: 1950-07-17  Age: 72 y.o. MRN: [id number]  CC:  No chief complaint on file.   HPI Terri Werner presents for hospital follow up.   Discharge Date: 03/06/23 Diagnosis: partial bowel obstruction  Procedures/tests: CT A/P showing partial mid-to-distal small bowel obstruction, potentially due to adhesions. No bowel pneumatosis, definite small bowel wall thickening or pneumoperitoneum. Chronic large left periumbilical hernia containing a portion of the transverse colon without large bowel obstruction. Underwent ex-lap 10/18 with lysis of adhesions.   Consultants: Surgery New medications: None  Discontinued medications: None  Discharge instructions:  follow up with PCP Status: {Blank multiple:19196::"better","worse","stable","fluctuating"}   Hypertension: -Medications: Amlodipine 5 mg, Valsartan-HCTZ 80-12.5 mg daily  -Patient is compliant with above medications and reports no side effects. -Checking BP at home (average): 120-130/70-80 -Denies any SOB, CP, vision changes, LE edema or symptoms of hypotension.   HLD/HX of CVA: -Medications: Crestor discontinued after SATURN trial, but on Flexeril 5 mg PRN. Does still have muscle spasms in her foot at night -Patient finished with PT and OT outpatient. Ambulating well with cane. Doing home exercises. Receiving botox injections in her right lower extremity which have been helping somewhat. Still having right footdrop which is affecting her balance and ambulation  -Last lipid panel: Lipid Panel     Component Value Date/Time   CHOL 168 08/21/2022 0908   TRIG 55 08/21/2022 0908   HDL 48 (L) 08/21/2022 0908   CHOLHDL 3.5 08/21/2022 0908   VLDL 12 06/01/2021 0510   LDLCALC 106 (H) 08/21/2022 0908    Chronic Constipation: -Currently taking Miralax daily and Senna PRN -Having one BM about every 3 days, doing well no  changes.  Health Maintenance: -Blood work UTD -Mammogram 7/24 Birads-1  -Colon cancer screening: Colonoscopy 1/23 -Vaccinations up to date   Past Medical History:  Diagnosis Date   Arthritis    pt reports in back & knees   Colon polyps 2010   [facility]   Diverticulitis 2016   Genital warts    GERD (gastroesophageal reflux disease)    Heart murmur    Hepatitis B    History of blood transfusion    during each major surgery per pt   History of chicken pox    History of hiatal hernia    History of torn meniscus of right knee    HTN (hypertension)    Hx of migraine headaches    Hypertension    Hypopotassemia    Pre-diabetes    Stroke (HCC) 05/27/2021    Past Surgical History:  Procedure Laterality Date   ABDOMINAL HYSTERECTOMY  2006   APPENDECTOMY  1980   CESAREAN SECTION  1981   COLONOSCOPY WITH PROPOFOL N/A 11/01/2014   Procedure: COLONOSCOPY WITH PROPOFOL;  Surgeon: Scot Jun, MD;  Location: [facility] ENDOSCOPY;  Service: Endoscopy;  Laterality: N/A;   COLONOSCOPY WITH PROPOFOL N/A 01/05/2020   Procedure: COLONOSCOPY WITH PROPOFOL;  Surgeon: Regis Bill, MD;  Location: [facility] ENDOSCOPY;  Service: Endoscopy;  Laterality: N/A;   CYSTOSCOPY WITH BIOPSY N/A 02/19/2022   Procedure: CYSTOSCOPY WITH BLADDER BIOPSY;  Surgeon: Vanna Scotland, MD;  Location: [facility] ORS;  Service: Urology;  Laterality: N/A;   ECTOPIC PREGNANCY SURGERY  1980   HERNIA REPAIR  2006   Umbilical   INCISIONAL HERNIA REPAIR N/A 03/01/2023   Procedure: PRIMARY REPAIR OF INCISIONAL HERNIA;  Surgeon: Sung Amabile, DO;  Location: [facility] ORS;  Service: General;  Laterality: N/A;   KNEE ARTHROSCOPY Left 2006   torn meniscus   LAPAROTOMY N/A 03/01/2023   Procedure: EXPLORATORY LAPAROTOMY;  Surgeon: Sung Amabile, DO;  Location: [facility] ORS;  Service: General;  Laterality: N/A;   LYSIS OF ADHESION N/A 03/01/2023   Procedure: LYSIS OF ADHESIONS;  Surgeon: Sung Amabile, DO;  Location: [facility] ORS;  Service: General;   Laterality: N/A;   TONSILLECTOMY  1971   TOTAL ABDOMINAL HYSTERECTOMY W/ BILATERAL SALPINGOOPHORECTOMY  2006   VESICOVAGINAL FISTULA CLOSURE W/ TAH      Family History  Problem Relation Age of Onset   Breast cancer Sister 38   Hypertension Mother    Alzheimer's disease Mother    Dementia Mother    Hypertension Maternal Grandfather    Hyperlipidemia Maternal Grandfather    Birth defects Maternal Grandmother        Bladder   Colon cancer Maternal Grandmother    Congestive Heart Failure Father    Hyperlipidemia Father    Dementia Paternal Grandmother    Dementia Paternal Grandfather    Thyroid disease Sister     Social History   Socioeconomic History   Marital status: Widowed    Spouse name: Not on file   Number of children: 2   Years of education: Not on file   Highest education level: Master's degree (e.g., MA, MS, MEng, MEd, MSW, MBA)  Occupational History   Occupation: [employer]    Comment: Full Time    Employer: 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      Regular Exercise -  NO   Daily Caffeine Use:  1 soda/tea          Social Determinants of Health   Financial Resource Strain: Medium Risk (08/20/2022)   Overall Financial Resource Strain (CARDIA)    Difficulty of Paying Living Expenses: Somewhat hard  Food Insecurity: No Food Insecurity (02/28/2023)   Hunger Vital Sign    Worried About Running Out of Food in the Last Year: Never true    Ran Out of Food in the Last Year: Never true  Transportation  Needs: No Transportation Needs (02/28/2023)   PRAPARE - Administrator, Civil Service (Medical): No    Lack of Transportation (Non-Medical): No  Physical Activity: Sufficiently Active (08/20/2022)   Exercise Vital Sign    Days of Exercise per Week: 3 days    Minutes of Exercise per Session: 50 min  Stress: No Stress Concern Present (08/20/2022)   [employer] of Occupational Health - Occupational Stress Questionnaire    Feeling of Stress : Only a little  Social Connections: Moderately Integrated (08/20/2022)   Social Connection and Isolation Panel [NHANES]    Frequency of Communication with Friends and Family: More than three times a week    Frequency of Social Gatherings with Friends and Family: More than three times a week    Attends Religious Services: More than 4 times per year  Active Member of Clubs or Organizations: Yes    Attends Banker Meetings: More than 4 times per year    Marital Status: Widowed  Intimate Partner Violence: Not At Risk (02/28/2023)   Humiliation, Afraid, Rape, and Kick questionnaire    Fear of Current or Ex-Partner: No    Emotionally Abused: No    Physically Abused: No    Sexually Abused: No    Outpatient Medications Prior to Visit  Medication Sig Dispense Refill   acetaminophen (TYLENOL) 325 MG tablet Take 2 tablets (650 mg total) by mouth every 4 (four) hours as needed for mild pain (or temp > 37.5 C (99.5 F)).     acetaminophen (TYLENOL) 325 MG tablet Take 2 tablets (650 mg total) by mouth every 8 (eight) hours as needed for mild pain (pain score 1-3). 40 tablet 0   amLODipine (NORVASC) 5 MG tablet Take 1 tablet (5 mg total) by mouth daily. 90 tablet 1   cyclobenzaprine (FLEXERIL) 5 MG tablet Take 1 tablet (5 mg total) by mouth at bedtime as needed for muscle spasms. 90 tablet 1   docusate sodium (COLACE) 100 MG capsule Take 1 capsule (100 mg total) by mouth 2 (two) times daily as needed for up to 10 days for mild constipation.  20 capsule 0   HYDROcodone-acetaminophen (NORCO) 5-325 MG tablet Take 1 tablet by mouth every 6 (six) hours as needed for up to 15 doses for moderate pain (pain score 4-6). 15 tablet 0   ibuprofen (ADVIL) 800 MG tablet Take 1 tablet (800 mg total) by mouth every 8 (eight) hours as needed for mild pain (pain score 1-3) or moderate pain (pain score 4-6). 30 tablet 0   lidocaine (XYLOCAINE) 5 % ointment Apply 1 Application topically as needed. 35.44 g 2   polyethylene glycol powder (GLYCOLAX/MIRALAX) 17 GM/SCOOP powder Take 17 g by mouth daily as needed for moderate constipation. 3350 g 1   rosuvastatin (CRESTOR) 10 MG tablet Take 1 tablet (10 mg total) by mouth daily. 90 tablet 1   valsartan-hydrochlorothiazide (DIOVAN-HCT) 80-12.5 MG tablet Take 1 tablet by mouth daily. 90 tablet 1   No facility-administered medications prior to visit.    Allergies  Allergen Reactions   Latex Itching, Rash and Anaphylaxis    Burning   Shellfish-Derived Products Anaphylaxis   Shrimp [Shellfish Allergy] Swelling    Lips will burn     ROS Review of Systems  Constitutional:  Negative for chills and fever.  Eyes:  Negative for visual disturbance.  Respiratory:  Negative for shortness of breath.   Cardiovascular:  Negative for chest pain and palpitations.  Musculoskeletal:  Positive for arthralgias and back pain.  Neurological:  Negative for dizziness and headaches.      Objective:    Physical Exam Constitutional:      Appearance: Normal appearance.     Comments: Walking with cane  HENT:     Head: Normocephalic and atraumatic.  Eyes:     Conjunctiva/sclera: Conjunctivae normal.  Neck:     Vascular: No carotid bruit.  Cardiovascular:     Rate and Rhythm: Normal rate and regular rhythm.  Pulmonary:     Effort: Pulmonary effort is normal.     Breath sounds: Normal breath sounds.  Skin:    General: Skin is warm and dry.  Neurological:     Mental Status: She is alert. Mental status is at  baseline.  Psychiatric:        Mood and Affect: Mood normal.  Behavior: Behavior normal.    There were no vitals taken for this visit. Wt Readings from Last 3 Encounters:  03/01/23 210 lb 12.2 oz (95.6 kg)  02/14/23 210 lb 12.8 oz (95.6 kg)  01/16/23 202 lb (91.6 kg)   There were no vitals filed for this visit.    Health Maintenance Due  Topic Date Due   COVID-19 Vaccine (8 - 2023-24 season) 03/12/2023     There are no preventive care reminders to display for this patient.  Lab Results  Component Value Date   TSH 1.80 02/27/2021   Lab Results  Component Value Date   WBC 4.9 03/04/2023   HGB 10.8 (L) 03/04/2023   HCT 32.5 (L) 03/04/2023   MCV 87.4 03/04/2023   PLT 159 03/04/2023   Lab Results  Component Value Date   NA 138 03/06/2023   K 4.0 03/06/2023   CO2 27 03/06/2023   GLUCOSE 110 (H) 03/06/2023   BUN 11 03/06/2023   CREATININE 0.79 03/06/2023   BILITOT 0.8 02/28/2023   ALKPHOS 50 02/28/2023   AST 22 02/28/2023   ALT 17 02/28/2023   PROT 8.5 (H) 02/28/2023   ALBUMIN 4.4 02/28/2023   CALCIUM 8.8 (L) 03/06/2023   ANIONGAP 8 03/06/2023   EGFR 74 08/21/2022   GFR 82.53 07/05/2014   Lab Results  Component Value Date   CHOL 168 08/21/2022   Lab Results  Component Value Date   HDL 48 (L) 08/21/2022   Lab Results  Component Value Date   LDLCALC 106 (H) 08/21/2022   Lab Results  Component Value Date   TRIG 55 08/21/2022   Lab Results  Component Value Date   CHOLHDL 3.5 08/21/2022   Lab Results  Component Value Date   HGBA1C 5.9 (A) 02/14/2023      Assessment & Plan:   1. Hypertension, benign: Blood pressure stable here today, no changes made to medications and appropriate refills sent to pharmacy.   - amLODipine (NORVASC) 5 MG tablet; Take 1 tablet (5 mg total) by mouth daily.  Dispense: 90 tablet; Refill: 1 - valsartan-hydrochlorothiazide (DIOVAN-HCT) 80-12.5 MG tablet; Take 1 tablet by mouth daily.  Dispense: 90 tablet; Refill:  1  2. History of CVA (cerebrovascular accident)/Pure hypercholesterolemia: SATURN trial now over, patient would benefit from being back on a statin, will refill Crestor 10 mg.   - rosuvastatin (CRESTOR) 10 MG tablet; Take 1 tablet (10 mg total) by mouth daily.  Dispense: 90 tablet; Refill: 1  3. Pre-diabetes: A1c good here today at 5.9%. Patient will continue to monitor diet.   - POCT HgB A1C  4. Bilateral carpal tunnel syndrome/Right foot drop: Patient had been seeing an Orthopedist in [place] but is frustrated with the lack of progress with right foot drop. Still walking with a cane, having balance and ambulation issues. Had been working with PT, OT and chiropractor without change. Discussed this may be permanent residual effect after stroke. She also has bilateral carpal tunnel symptoms and would like to be referred to a new Orthopedist.   - AMB referral to orthopedics  5. Muscle spasms of both lower extremities: Refill Flexeril to take as needed.   - cyclobenzaprine (FLEXERIL) 5 MG tablet; Take 1 tablet (5 mg total) by mouth at bedtime as needed for muscle spasms.  Dispense: 90 tablet; Refill: 1   Follow-up: No follow-ups on file.    Margarita Mail, DO

## 2023-03-14 ENCOUNTER — Ambulatory Visit: Payer: Medicare Other | Admitting: Internal Medicine

## 2023-03-14 ENCOUNTER — Encounter: Payer: Self-pay | Admitting: Internal Medicine

## 2023-03-14 VITALS — BP 136/84 | HR 89 | Temp 98.2°F | Resp 18 | Ht 62.0 in | Wt 207.5 lb

## 2023-03-14 DIAGNOSIS — K5651 Intestinal adhesions [bands], with partial obstruction: Secondary | ICD-10-CM | POA: Diagnosis not present

## 2023-03-14 DIAGNOSIS — Z09 Encounter for follow-up examination after completed treatment for conditions other than malignant neoplasm: Secondary | ICD-10-CM | POA: Diagnosis not present

## 2023-03-15 ENCOUNTER — Other Ambulatory Visit: Payer: Self-pay

## 2023-03-15 NOTE — Patient Outreach (Signed)
Care Management  Transitions of Care Program Transitions of Care Post-discharge week 2   03/15/2023 Name: Terri Werner MRN: [id number] DOB: [birth year]  Subjective: Terri Werner is a 72 y.o. year old female who is a primary care patient of Margarita Mail, DO. The Care Management team Engaged with patient Engaged with patient by telephone to assess and address transitions of care needs.   Consent to Services:  Patient was given information about care management services, agreed to services, and gave verbal consent to participate.   Assessment:   Patient voices no new complaints Patient has not developed/ reported any new Medical issues / Dx or acute changes.- since last follow-up call for most recent  Hospital stay 10/17-10/23 / 2024  She is back to usual routine. She had PCP post hospital follow-up with no new changes. She was seen by surgeon 10/31 she had a partial staple removal and will return 03/20/23 fir final staple removal. He wound vac was removed 03/10/23. She reports no drainage or s/s infection from site. She is covering w/ DD daily.  She is having regular soft BM,, no discomfort,  Patient educated on red flags s/s to watch for and was encouraged to report any of these identified , any new symptoms , changes in baseline or  medication regimen,  change in health status  /  well-being, or safety concerns to PCP and / or the  [facility] Case Management team .          SDOH Interventions    Flowsheet Row Patient Outreach from 03/15/2023 in [place] POPULATION HEALTH DEPARTMENT Clinical Support from 07/19/2022 in [facility] Office Visit from 09/24/2018 in [facility] Office Visit from 03/05/2018 in [facility]  SDOH Interventions      Food Insecurity Interventions Intervention Not Indicated Intervention Not Indicated -- --  Housing Interventions Intervention Not Indicated  Intervention Not Indicated -- --  Transportation Interventions -- Intervention Not Indicated -- --  Utilities Interventions Intervention Not Indicated Intervention Not Indicated -- --  Depression Interventions/Treatment  -- -- PHQ2-9 Score <4 Follow-up Not Indicated Counseling  Physical Activity Interventions -- Intervention Not Indicated -- --  Stress Interventions -- Intervention Not Indicated -- --  Social Connections Interventions -- Intervention Not Indicated -- --        Goals Addressed             This Visit's Progress    TOC Care Plan       Current Barriers:  Provider appointments Follow-up with PCP and Surgeon  RNCM Clinical Goal(s):  Patient will work with the Care Management team over the next 30 days to address Transition of Care Barriers: Medication Management Provider appointments take all medications exactly as prescribed and will call provider for medication related questions as evidenced by no missed medication doses no adverse unresolved side effects  attend all scheduled medical appointments: with PCP and Surgeon as noted  as evidenced by no missed appointments  demonstrate ongoing self health care management ability with medication, monitoring regular BM and wound care to incision site  as evidenced by no unscheduled ER visits   through collaboration with RN Care manager, provider, and care team.   Interventions: Evaluation of current treatment plan related to  self management and patient's adherence to plan as established by provider  Transitions of Care:  Goal on track:  Yes. Doctor Visits  - discussed the importance of doctor visits  Patient Goals/Self-Care  Activities: Participate in Transition of Care Program/Attend TOC scheduled calls Take all medications as prescribed Attend all scheduled provider appointments Call pharmacy for medication refills 3-7 days in advance of running out of medications Perform all self care activities independently  Perform  IADL's (shopping, preparing meals, housekeeping, managing finances) independently Call provider office for new concerns or questions   Follow Up Plan:  Telephone follow up appointment with care management team member scheduled for:  03/20/23 9:30am The patient has been provided with contact information for the care management team and has been advised to call with any health related questions or concerns.  Follow up with provider re: PCP post hospital visit 10/31 Surgical f/u 10/30- both completed Next f/u 03/20/23 for staple removal          Plan: The patient has been provided with contact information for the care management team and has been advised to call with any health related questions or concerns.  Routine follow-up and on-going assessment evaluation and education of disease processes, recommended interventions for both chronic and acute medical conditions , will occur during each weekly visit along with ongoing review of symptoms ,medication reviews and reconciliation. Any updates , inconsistencies, discrepancies or acute care concerns will be addressed and routed to the correct Practitioner if indicated   Please refer to Care Plan for goals and interventions -Effectiveness of interventions, symptom management and outcomes will be evaluated  weekly during [facility] 30-day Program Outreach calls  . Any necessary  changes and updates to Care Plan will be completed episodically    Reviewed goals for care  Patient provided with Contact information and verbalized understanding with current POC.   Susa Loffler , BSN, RN Care Management Coordinator [place]   [facility] [email or web address] Direct Dial: [phone]

## 2023-03-18 NOTE — Anesthesia Postprocedure Evaluation (Signed)
Anesthesia Post Note  Patient: Terri Werner  Procedure(s) Performed: EXPLORATORY LAPAROTOMY (Abdomen) PRIMARY REPAIR OF INCISIONAL HERNIA (Abdomen) LYSIS OF ADHESIONS (Abdomen)  Patient location during evaluation: PACU Anesthesia Type: General Level of consciousness: awake and alert Pain management: pain level controlled Vital Signs Assessment: post-procedure vital signs reviewed and stable Respiratory status: spontaneous breathing, nonlabored ventilation, respiratory function stable and patient connected to nasal cannula oxygen Cardiovascular status: blood pressure returned to baseline and stable Postop Assessment: no apparent nausea or vomiting Anesthetic complications: no   No notable events documented.   Last Vitals:  Vitals:   03/06/23 0357 03/06/23 0802  BP: 116/69 126/80  Pulse: 71 76  Resp: 18 18  Temp: 36.9 C 37.4 C  SpO2: 99% 100%    Last Pain:  Vitals:   03/06/23 1023  TempSrc:   PainSc: 0-No pain                 Lenard Simmer

## 2023-03-20 ENCOUNTER — Other Ambulatory Visit: Payer: Self-pay

## 2023-03-20 NOTE — Patient Outreach (Addendum)
Care Management  Transitions of Care Program Transitions of Care Post-discharge week 3   03/20/2023 Name: Terri Werner MRN: [id number] DOB: 09/12/1950  Subjective: Terri Werner is a 72 y.o. year old female who is a primary care patient of Margarita Mail, DO. The Care Management team Engaged with patient Engaged with patient by telephone to assess and address transitions of care needs.   Consent to Services:  Patient was given information about care management services, agreed to services, and gave verbal consent to participate.   Assessment:     Patient voices no new complaints Patient has not developed/ reported any new Medical issues / Dx or acute changes.- since last follow-up call for most recent  Hospital stay    10/17-10/23 / 2024  She is doing very well. She is scheduled for a follow-up today with surgeon for final staple removal.She is concerned regarding a few "lumps" along incision line which are most likely scar tissue but she will check with surgeon today. She is using OTC stool softener and laxatives , + Bms She continues to receive meals from [facility] plan , this is final week. She states she has no concerns r/t meals and has adequate help at home.  Patient educated on red flags s/s to watch for and was encouraged to report any of these identified , any new symptoms , changes in baseline or  medication regimen,  change in health status  /  well-being, or safety concerns to PCP and / or the  [facility] Case Management team .        SDOH Interventions    Flowsheet Row Patient Outreach from 03/15/2023 in [place] POPULATION HEALTH DEPARTMENT Clinical Support from 07/19/2022 in [facility] Office Visit from 09/24/2018 in [facility] Office Visit from 03/05/2018 in [facility]  SDOH Interventions      Food Insecurity Interventions Intervention Not Indicated Intervention  Not Indicated -- --  Housing Interventions Intervention Not Indicated Intervention Not Indicated -- --  Transportation Interventions -- Intervention Not Indicated -- --  Utilities Interventions Intervention Not Indicated Intervention Not Indicated -- --  Depression Interventions/Treatment  -- -- PHQ2-9 Score <4 Follow-up Not Indicated Counseling  Physical Activity Interventions -- Intervention Not Indicated -- --  Stress Interventions -- Intervention Not Indicated -- --  Social Connections Interventions -- Intervention Not Indicated -- --        Goals Addressed             This Visit's Progress    TOC Care Plan       Current Barriers:  Provider appointments Follow-up with PCP and Surgeon  RNCM Clinical Goal(s):  Patient will work with the Care Management team over the next 30 days to address Transition of Care Barriers: Medication Management Provider appointments take all medications exactly as prescribed and will call provider for medication related questions as evidenced by no missed medication doses no adverse unresolved side effects  attend all scheduled medical appointments: with PCP and Surgeon as noted  as evidenced by no missed appointments  demonstrate ongoing self health care management ability with medication, monitoring regular BM and wound care to incision site  as evidenced by no unscheduled ER visits   through collaboration with RN Care manager, provider, and care team.   Interventions: Evaluation of current treatment plan related to  self management and patient's adherence to plan as established by provider  Transitions of Care:  Goal on  track:  Yes. Doctor Visits  - discussed the importance of doctor visits  Patient Goals/Self-Care Activities: Participate in Transition of Care Program/Attend [facility] scheduled calls Take all medications as prescribed Attend all scheduled provider appointments Call pharmacy for medication refills 3-7 days in advance of running out  of medications Perform all self care activities independently  Perform IADL's (shopping, preparing meals, housekeeping, managing finances) independently Call provider office for new concerns or questions   Follow Up Plan:  Telephone follow up appointment with care management team member scheduled for:  03/26/23 10:00am The patient has been provided with contact information for the care management team and has been advised to call with any health related questions or concerns.  Follow up with provider re: PCP post hospital visit 10/31 Surgical f/u 10/30- both completed  03/20/23 for final staple removal          Plan: The patient has been provided with contact information for the care management team and has been advised to call with any health related questions or concerns.   Routine follow-up and on-going assessment evaluation and education of disease processes, recommended interventions for both chronic and acute medical conditions , will occur during each weekly visit along with ongoing review of symptoms ,medication reviews and reconciliation. Any updates , inconsistencies, discrepancies or acute care concerns will be addressed and routed to the correct Practitioner if indicated   Please refer to Care Plan for goals and interventions -Effectiveness of interventions, symptom management and outcomes will be evaluated  weekly during [facility] 30-day Program Outreach calls  . Any necessary  changes and updates to Care Plan will be completed episodically    Reviewed goals for care  Patient provided with Contact information and verbalized understanding with current POC.   Susa Loffler , BSN, RN Care Management Coordinator [place]   [facility] [email or web address] Direct Dial: [phone]

## 2023-03-26 ENCOUNTER — Other Ambulatory Visit: Payer: Self-pay

## 2023-03-26 DIAGNOSIS — I619 Nontraumatic intracerebral hemorrhage, unspecified: Secondary | ICD-10-CM

## 2023-03-26 NOTE — Patient Outreach (Signed)
Care Management  Transitions of Care Program Transitions of Care Post-discharge week 4   03/26/2023 Name: Terri Werner MRN: [id number] DOB: [birth year]  Subjective: Terri Werner is a 72 y.o. year old female who is a primary care patient of Margarita Mail, DO. The Care Management team Engaged with patient Engaged with patient by telephone to assess and address transitions of care needs.   Consent to Services:  Patient was given information about care management services, agreed to services, and gave verbal consent to participate.   Assessment:   Patient voices no new complaints Patient has not developed/ reported any new Medical issues / Dx or acute changes.- since last follow-up call for most recent  Hospital stay    10/17-10/23  / 2024  She had staples removed 11/6. Incision line is clean and dry. She reports when leaving her appt. She had a fall.in the parking lot. She did not report this to her PCP or surgeon. Enc her to do so. She denies any pain , injury or trauma. She has been cleared by Surgical and has no scheduled follow-up. She states she was due an MRI prior to surgery Call placed to [facility],( [phone]) they will verify Berkley Harvey is still good for MRI, and if so reach put to patient to schedule.  She completed her 2 weeks meal delivery service and mentions she is concerned she will have enough food. She also sated she is strained financially Amb referral made Patient educated on red flags s/s to watch for and was encouraged to report any of these identified , any new symptoms , changes in baseline or  medication regimen,  change in health status  /  well-being, or safety concerns to PCP and / or the  [facility]I Case Management team .          SDOH Interventions    Flowsheet Row Patient Outreach from 03/26/2023 in [place] POPULATION HEALTH DEPARTMENT Patient Outreach from 03/15/2023 in [place] POPULATION HEALTH DEPARTMENT Clinical Support from  07/19/2022 in [facility] Office Visit from 09/24/2018 in [facility] Office Visit from 03/05/2018 in [facility]  SDOH Interventions       Food Insecurity Interventions Other (Comment), AMB Referral  [Was receiving Meal delivery, She expressed some concern wirh being avle ro have enough foood with her current resources] Intervention Not Indicated Intervention Not Indicated -- --  Housing Interventions -- Intervention Not Indicated Intervention Not Indicated -- --  Transportation Interventions -- -- Intervention Not Indicated -- --  Utilities Interventions -- Intervention Not Indicated Intervention Not Indicated -- --  Depression Interventions/Treatment  -- -- -- PHQ2-9 Score <4 Follow-up Not Indicated Counseling  Financial Strain Interventions Other (Comment)  [Ambulatory Referral] -- -- -- --  Physical Activity Interventions -- -- Intervention Not Indicated -- --  Stress Interventions -- -- Intervention Not Indicated -- --  Social Connections Interventions -- -- Intervention Not Indicated -- --        Goals Addressed             This Visit's Progress    TOC Care Plan       Current Barriers:  Provider appointments Follow-up with PCP and Surgeon  RNCM Clinical Goal(s):  Patient will work with the Care Management team over the next 30 days to address Transition of Care Barriers: Medication Management Provider appointments take all medications exactly as prescribed and will call provider for medication related questions as evidenced  by no missed medication doses no adverse unresolved side effects  attend all scheduled medical appointments: with PCP and Surgeon as noted  as evidenced by no missed appointments  demonstrate ongoing self health care management ability with medication, monitoring regular BM and wound care to incision site  as evidenced by no unscheduled ER visits   through collaboration with RN Care  manager, provider, and care team.   Interventions: Evaluation of current treatment plan related to  self management and patient's adherence to plan as established by provider  Transitions of Care:  Goal on track:  Yes. Doctor Visits  - discussed the importance of doctor visits  Patient Goals/Self-Care Activities: Participate in Transition of Care Program/Attend [facility] scheduled calls Take all medications as prescribed Attend all scheduled provider appointments Call pharmacy for medication refills 3-7 days in advance of running out of medications Perform all self care activities independently  Perform IADL's (shopping, preparing meals, housekeeping, managing finances) independently Call provider office for new concerns or questions   Follow Up Plan:  Telephone follow up appointment with care management team member scheduled for:  04/02/23 10:00am The patient has been provided with contact information for the care management team and has been advised to call with any health related questions or concerns.  Follow up with provider re: PCP post hospital visit 10/31 Surgical f/u 10/30- both completed  03/20/23 for final staple removal- completed , Needs to have a MRI scheduled ( previously scheduled cancelled due to Hospitalization)          Plan:  Routine follow-up and on-going assessment evaluation and education of disease processes, recommended interventions for both chronic and acute medical conditions , will occur during each weekly visit along with ongoing review of symptoms ,medication reviews and reconciliation. Any updates , inconsistencies, discrepancies or acute care concerns will be addressed and routed to the correct Practitioner if indicated   Please refer to Care Plan for goals and interventions -Effectiveness of interventions, symptom management and outcomes will be evaluated  weekly during [facility] 30-day Program Outreach calls  . Any necessary  changes and updates to Care Plan will be  completed episodically    Reviewed goals for care  Patient provided with Contact information and verbalized understanding with current POC.  The patient has been provided with contact information for the care management team and has been advised to call with any health related questions or concerns.   Susa Loffler , BSN, RN Care Management Coordinator [place]   [facility] [email or web address] Direct Dial: [phone]

## 2023-03-27 ENCOUNTER — Telehealth: Payer: Self-pay | Admitting: *Deleted

## 2023-03-27 NOTE — Progress Notes (Unsigned)
  Care Coordination  Outreach Note  03/27/2023 Name: KEILIANYS VANATTA MRN: [id number] DOB: [birth year]   Care Coordination Outreach Attempts: An unsuccessful telephone outreach was attempted today to offer the patient information about available care coordination services.  Follow Up Plan:  Additional outreach attempts will be made to offer the patient care coordination information and services.   Encounter Outcome:  No Answer  Burman Nieves, CCMA Care Coordination Care Guide Direct Dial: [phone]

## 2023-03-28 DIAGNOSIS — M545 Low back pain, unspecified: Secondary | ICD-10-CM | POA: Diagnosis not present

## 2023-03-28 NOTE — Progress Notes (Signed)
  Care Coordination   Note   03/28/2023 Name: Terri Werner MRN: [id number] DOB: Oct 04, 1950  Terri Werner is a 72 y.o. year old female who sees Margarita Mail, DO for primary care. I reached out to Terri Werner by phone today to offer care coordination services.  Terri Werner was given information about Care Coordination services today including:   The Care Coordination services include support from the care team which includes your Nurse Coordinator, Clinical Social Worker, or Pharmacist.  The Care Coordination team is here to help remove barriers to the health concerns and goals most important to you. Care Coordination services are voluntary, and the patient may decline or stop services at any time by request to their care team member.   Care Coordination Consent Status: Patient agreed to services and verbal consent obtained.   Follow up plan:  Telephone appointment with care coordination team member scheduled for:  04/04/2023  Encounter Outcome:  Patient Scheduled from referral   Terri Werner, [facility] Care Coordination Care Guide Direct Dial: [phone]

## 2023-03-28 NOTE — Progress Notes (Signed)
  Care Coordination  Outreach Note  03/28/2023 Name: KETTY SPIZZIRRI MRN: [id number] DOB: 10/05/1950   Care Coordination Outreach Attempts: A second unsuccessful outreach was attempted today to offer the patient with information about available care coordination services.  Follow Up Plan:  Additional outreach attempts will be made to offer the patient care coordination information and services.   Encounter Outcome:  No Answer  Burman Nieves, CCMA Care Coordination Care Guide Direct Dial: [phone]

## 2023-04-02 ENCOUNTER — Other Ambulatory Visit: Payer: Self-pay

## 2023-04-02 NOTE — Patient Outreach (Signed)
Care Management  Transitions of Care Program Transitions of Care Post-discharge Program Completion   04/02/2023 Name: Terri Werner MRN: [id number] DOB: [birth year]  Subjective: Terri Werner is a 72 y.o. year old female who is a primary care patient of Margarita Mail, DO. The Care Management team Engaged with patient Engaged with patient by telephone to assess and address transitions of care needs.   Consent to Services:  Patient was given information about care management services, agreed to services, and gave verbal consent to participate.   Assessment:     Patient voices no new complaints Patient has not developed/ reported any new Medical issues / Dx or acute changes.- since last follow-up call for most recent  Hospital stay   10/17-10/23  / 2024  She is doing very well Surgical incision healed , does report small amt of scr tissue along incision line. She had MRI completed 11/14 results ae posted and she has appt to see Orthopedics 12/6. She was able to schedule earlier but wanted to see MD in local office. No medication changes,She had a Amb referral ast week for Food and Financial Insecurity Appt is 04/04/23-Thursday this week. Patient educated on red flags s/s to watch for and was encouraged to report any of these identified , any new symptoms , changes in baseline or  medication regimen,  change in health status  /  well-being, or safety concerns to PCP and / or the  VBCI Case Management team .        SDOH Interventions    Flowsheet Row Patient Outreach from 04/02/2023 in [place] POPULATION HEALTH DEPARTMENT Patient Outreach from 03/26/2023 in [place] POPULATION HEALTH DEPARTMENT Patient Outreach from 03/15/2023 in [place] POPULATION HEALTH DEPARTMENT Clinical Support from 07/19/2022 in [facility] Office Visit from 09/24/2018 in [facility] Office Visit from 03/05/2018 in [facility]  SDOH Interventions        Food Insecurity Interventions AMB Referral Other (Comment), AMB Referral  [Was receiving Meal delivery, She expressed some concern wirh being avle ro have enough foood with her current resources] Intervention Not Indicated Intervention Not Indicated -- --  Housing Interventions -- -- Intervention Not Indicated Intervention Not Indicated -- --  Transportation Interventions -- -- -- Intervention Not Indicated -- --  Utilities Interventions -- -- Intervention Not Indicated Intervention Not Indicated -- --  Depression Interventions/Treatment  -- -- -- -- PHQ2-9 Score <4 Follow-up Not Indicated Counseling  Financial Strain Interventions -- Other (Comment)  [Ambulatory Referral] -- -- -- --  Physical Activity Interventions -- -- -- Intervention Not Indicated -- --  Stress Interventions -- -- -- Intervention Not Indicated -- --  Social Connections Interventions -- -- -- Intervention Not Indicated -- --        Goals Addressed             This Visit's Progress    COMPLETED: TOC Care Plan       Current Barriers:  Provider appointments Follow-up with PCP and Surgeon  RNCM Clinical Goal(s):  Patient will work with the Care Management team over the next 30 days to address Transition of Care Barriers: Medication Management Provider appointments take all medications exactly as prescribed and will call provider for medication related questions as evidenced by no missed medication doses no adverse unresolved side effects  attend all scheduled medical appointments: with PCP and Surgeon as noted  as evidenced by no missed appointments  demonstrate ongoing  self health care management ability with medication, monitoring regular BM and wound care to incision site  as evidenced by no unscheduled ER visits   through collaboration with RN Care manager, provider, and care team.   Interventions: Evaluation of current treatment plan related to  self  management and patient's adherence to plan as established by provider  Transitions of Care:  Goal Met. Doctor Visits  - discussed the importance of doctor visits  Patient Goals/Self-Care Activities: Participate in Transition of Care Program/Attend [facility] scheduled calls Take all medications as prescribed Attend all scheduled provider appointments Call pharmacy for medication refills 3-7 days in advance of running out of medications Perform all self care activities independently  Perform IADL's (shopping, preparing meals, housekeeping, managing finances) independently Call provider office for new concerns or questions   Follow Up Plan:  Telephone follow up appointment with care management team member scheduled for:  04/02/23 10:00am The patient has been provided with contact information for the care management team and has been advised to call with any health related questions or concerns.  Follow up with provider re: PCP post hospital visit 10/31 Surgical f/u 10/30- both completed  03/20/23 for final staple removal- completed , Needs to have a MRI completed results Scheduled 12/6 with Orthopedics          Plan:  The patient has successfully completed the 30-day TOC Program. Condition is stable No further acute needs identified at this time. Chronic conditions and ongoing care is  managed thru collaboration with  PCP,  Specialists and additional Healthcare Providers if indicated . Patient verbalized understanding of ongoing plan of care.  SDOH needs have been screened and interventions provided if identified.  Reviewed current home medications -- provided education as needed.  Previously discussed rationale of use, how/when to take medications. Patient is aware of potential side effects, and was encouraged to notify PCP for any changes in condition or signs / symptoms not relieved  with interventions.   Patient will call 911 for Medical Emergencies or Life -Threatening or report to a local  emergency department or urgent care.   Patient was encouraged to Contact PCP  with any questions or concerns regarding ongoing  medical care, any  difficulty obtaining or picking up  prescriptions, any  changes or  worsening in  condition including signs / symptoms not relieved  with interventions  Patient had no additional questions or concerns at this time. Current needs addressed.   The patient has been provided with contact information for the care management team and has been advised to call with any health related questions or concerns.   Susa Loffler , BSN, RN Care Management Coordinator [place]   [facility] [email or web address] Direct Dial: [phone]

## 2023-04-04 ENCOUNTER — Ambulatory Visit: Payer: Self-pay

## 2023-04-04 NOTE — Patient Outreach (Signed)
  Care Coordination   Initial Visit Note   04/04/2023 Name: Terri Werner MRN: [id number] DOB: [birth year]  Terri Werner is a 71 y.o. year old female who sees Margarita Mail, DO for primary care. I spoke with  Terri Werner by phone today.  What matters to the patients health and wellness today?  Patient request food options.    Goals Addressed             This Visit's Progress    Food       Interventions Today    Flowsheet Row Most Recent Value  General Interventions   General Interventions Discussed/Reviewed General Interventions Discussed, General Interventions Reviewed  [Pt request food resources. Pt income is $2305. Pt received food after hospital discharge. Pt also receives food from her church. Pt cares for her gr-daughter. Pt will ask her daughter to assist more for the child. Pt declines additional food banks.]              SDOH assessments and interventions completed:  Yes  SDOH Interventions Today    Flowsheet Row Most Recent Value  SDOH Interventions   Food Insecurity Interventions Intervention Not Indicated, Other (Comment)  [Pt receives help from church with food and feeds grdaughter]  Housing Interventions Intervention Not Indicated  Transportation Interventions Intervention Not Indicated, Other (Comment)  [Has a car and drives]  Utilities Interventions Intervention Not Indicated        Care Coordination Interventions:  Yes, provided   Follow up plan: No further intervention required.   Encounter Outcome:  Patient Visit Completed

## 2023-04-04 NOTE — Patient Instructions (Signed)
Visit Information  Thank you for taking time to visit with me today. Please don't hesitate to contact me if I can be of assistance to you.   Following are the goals we discussed today:  Patient will speak to daughter to assist with funds for grandchild. Patient will continue to work with her church for food assistance.   If you are experiencing a Mental Health or Behavioral Health Crisis or need someone to talk to, please call 911  Patient verbalizes understanding of instructions and care plan provided today and agrees to view in MyChart. Active MyChart status and patient understanding of how to access instructions and care plan via MyChart confirmed with patient.     No further follow up required: Patient does not request a follow up visit  Lysle Morales, BSW Social Workert  [phone]

## 2023-04-19 DIAGNOSIS — M5416 Radiculopathy, lumbar region: Secondary | ICD-10-CM | POA: Diagnosis not present

## 2023-04-26 DIAGNOSIS — G959 Disease of spinal cord, unspecified: Secondary | ICD-10-CM | POA: Diagnosis not present

## 2023-05-10 DIAGNOSIS — G959 Disease of spinal cord, unspecified: Secondary | ICD-10-CM | POA: Diagnosis not present

## 2023-05-17 DIAGNOSIS — M5416 Radiculopathy, lumbar region: Secondary | ICD-10-CM | POA: Diagnosis not present

## 2023-05-31 DIAGNOSIS — M5416 Radiculopathy, lumbar region: Secondary | ICD-10-CM | POA: Diagnosis not present

## 2023-06-04 ENCOUNTER — Encounter: Payer: Self-pay | Admitting: Internal Medicine

## 2023-06-04 ENCOUNTER — Other Ambulatory Visit: Payer: Self-pay

## 2023-06-04 ENCOUNTER — Ambulatory Visit (INDEPENDENT_AMBULATORY_CARE_PROVIDER_SITE_OTHER): Payer: Medicare Other | Admitting: Internal Medicine

## 2023-06-04 VITALS — BP 130/84 | HR 89 | Temp 98.1°F | Resp 16 | Ht 62.0 in | Wt 209.9 lb

## 2023-06-04 DIAGNOSIS — Z8673 Personal history of transient ischemic attack (TIA), and cerebral infarction without residual deficits: Secondary | ICD-10-CM | POA: Insufficient documentation

## 2023-06-04 DIAGNOSIS — Z0289 Encounter for other administrative examinations: Secondary | ICD-10-CM

## 2023-06-04 DIAGNOSIS — I10 Essential (primary) hypertension: Secondary | ICD-10-CM

## 2023-06-04 NOTE — Progress Notes (Signed)
Established Patient Office Visit  Subjective   Patient ID: Terri Werner, female    DOB: 11-18-1950  Age: 73 y.o. MRN: [id number]  Chief Complaint  Patient presents with   Consult    Paperwork filled out    HPI  Patient is here for form completion for the [facility]. We did similar paperwork for her last year.   Hypertension: -Medications: Amlodipine 5 mg, Valsartan-HCTZ 80-12.5 mg daily  -Patient is compliant with above medications and reports no side effects. -Checking BP at home (average): 120-130/70-80 -Denies any SOB, CP, vision changes, LE edema or symptoms of hypotension.   HLD/HX of CVA: -Medications: Crestor 10 mg, Flexeril 5 mg PRN. Does still have muscle spasms in her foot at night -Patient finished with PT and OT outpatient. Ambulating well with cane. Doing home exercises. Receiving botox injections in her right lower extremity which have been helping somewhat. Still having right footdrop which is affecting her balance and ambulation  -Last lipid panel: Lipid Panel     Component Value Date/Time   CHOL 168 08/21/2022 0908   TRIG 55 08/21/2022 0908   HDL 48 (L) 08/21/2022 0908   CHOLHDL 3.5 08/21/2022 0908   VLDL 12 06/01/2021 0510   LDLCALC 106 (H) 08/21/2022 0908    Health Maintenance: -Blood work UTD -Mammogram 7/24 Birads-1  -Colon cancer screening: Colonoscopy 1/23 -Vaccinations up to date   Past Medical History:  Diagnosis Date   Arthritis    pt reports in back & knees   Colon polyps 2010   At [facility]   Diverticulitis 2016   Genital warts    GERD (gastroesophageal reflux disease)    Heart murmur    Hepatitis B    History of blood transfusion    during each major surgery per pt   History of chicken pox    History of hiatal hernia    History of torn meniscus of right knee    HTN (hypertension)    Hx of migraine headaches    Hypertension    Hypopotassemia    Pre-diabetes    Stroke (HCC) 05/27/2021   Past Surgical History:  Procedure  Laterality Date   ABDOMINAL HYSTERECTOMY  2006   APPENDECTOMY  1980   CESAREAN SECTION  1981   COLONOSCOPY WITH PROPOFOL N/A 11/01/2014   Procedure: COLONOSCOPY WITH PROPOFOL;  Surgeon: Scot Jun, MD;  Location: [facility] ENDOSCOPY;  Service: Endoscopy;  Laterality: N/A;   COLONOSCOPY WITH PROPOFOL N/A 01/05/2020   Procedure: COLONOSCOPY WITH PROPOFOL;  Surgeon: Regis Bill, MD;  Location: [facility] ENDOSCOPY;  Service: Endoscopy;  Laterality: N/A;   CYSTOSCOPY WITH BIOPSY N/A 02/19/2022   Procedure: CYSTOSCOPY WITH BLADDER BIOPSY;  Surgeon: Vanna Scotland, MD;  Location: [facility] ORS;  Service: Urology;  Laterality: N/A;   ECTOPIC PREGNANCY SURGERY  1980   HERNIA REPAIR  2006   Umbilical   INCISIONAL HERNIA REPAIR N/A 03/01/2023   Procedure: PRIMARY REPAIR OF INCISIONAL HERNIA;  Surgeon: Sung Amabile, DO;  Location: [facility] ORS;  Service: General;  Laterality: N/A;   KNEE ARTHROSCOPY Left 2006   torn meniscus   LAPAROTOMY N/A 03/01/2023   Procedure: EXPLORATORY LAPAROTOMY;  Surgeon: Sung Amabile, DO;  Location: [facility] ORS;  Service: General;  Laterality: N/A;   LYSIS OF ADHESION N/A 03/01/2023   Procedure: LYSIS OF ADHESIONS;  Surgeon: Sung Amabile, DO;  Location: [facility] ORS;  Service: General;  Laterality: N/A;   TONSILLECTOMY  1971   TOTAL ABDOMINAL HYSTERECTOMY W/ BILATERAL SALPINGOOPHORECTOMY  2006  VESICOVAGINAL FISTULA CLOSURE W/ TAH     Social History   Tobacco Use   Smoking status: Never   Smokeless tobacco: Never  Vaping Use   Vaping status: Never Used  Substance Use Topics   Alcohol use: Not Currently    Comment: rare   Drug use: No   Social History   Socioeconomic History   Marital status: Widowed    Spouse name: Not on file   Number of children: 2   Years of education: Not on file   Highest education level: Master's degree (e.g., MA, MS, MEng, MEd, MSW, MBA)  Occupational History   Occupation: [employer] [place] [employer]    Comment: Full Time    Employer:  [employer]  Tobacco Use   Smoking status: Never   Smokeless tobacco: Never  Vaping Use   Vaping status: Never Used  Substance and Sexual Activity   Alcohol use: Not Currently    Comment: rare   Drug use: No   Sexual activity: Not Currently    Partners: Male  Other Topics Concern   Not on file  Social History Narrative   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      Regular Exercise -  NO   Daily Caffeine Use:  1 soda/tea          Social Drivers of Health   Financial Resource Strain: Medium Risk (06/02/2023)   Overall Financial Resource Strain (CARDIA)    Difficulty of Paying Living Expenses: Somewhat hard  Food Insecurity: Food Insecurity Present (06/02/2023)   Hunger Vital Sign    Worried About Running Out of Food in the Last Year: Sometimes true    Ran Out of Food in the Last Year: Sometimes true  Transportation Needs: No Transportation Needs (06/02/2023)   PRAPARE - Administrator, Civil Service (Medical): No    Lack of Transportation (Non-Medical): No  Physical Activity: Sufficiently Active (06/02/2023)   Exercise Vital Sign    Days of Exercise per Week: 3 days    Minutes of Exercise per Session: 50 min  Stress: No Stress Concern Present (06/02/2023)   [employer] of Occupational Health - Occupational Stress Questionnaire    Feeling of Stress : Not at all  Social Connections: Moderately Integrated (06/02/2023)   Social Connection and Isolation Panel [NHANES]    Frequency of Communication with Friends and Family: More than three times a week    Frequency of Social Gatherings with Friends and Family: Twice a week    Attends Religious Services: More than 4 times per year    Active Member of [employer] or Organizations: Yes    Attends Banker Meetings: More than 4 times per year    Marital Status: Widowed  Intimate Partner Violence: Not  At Risk (04/04/2023)   Humiliation, Afraid, Rape, and Kick questionnaire    Fear of Current or Ex-Partner: No    Emotionally Abused: No    Physically Abused: No    Sexually Abused: No   Family Status  Relation Name Status   Sister  Deceased   Mother  Deceased   MGF  Deceased   MGM  Deceased   Father  Alive, age 21y   PGM  Deceased   PGF  Deceased   Sister Clinical cytogeneticist   Sister  Alive   Daughter Scientist, product/process development   Daughter [employer]  No partnership data on file   Family History  Problem Relation Age of Onset   Breast cancer Sister 55   Hypertension Mother    Alzheimer's disease Mother    Dementia Mother    Hypertension Maternal Grandfather    Hyperlipidemia Maternal Grandfather    Birth defects Maternal Grandmother        Bladder   Colon cancer Maternal Grandmother    Congestive Heart Failure Father    Hyperlipidemia Father    Dementia Paternal Grandmother    Dementia Paternal Grandfather    Thyroid disease Sister    Allergies  Allergen Reactions   Latex Itching, Rash and Anaphylaxis    Burning   Shellfish-Derived Products Anaphylaxis   Shrimp [Shellfish Allergy] Swelling    Lips will burn       Review of Systems  All other systems reviewed and are negative.     Objective:     BP 130/84 (Cuff Size: Large)   Pulse 89   Temp 98.1 F (36.7 C) (Oral)   Resp 16   Ht 5\' 2"  (1.575 m)   Wt 209 lb 14.4 oz (95.2 kg)   SpO2 98%   BMI 38.39 kg/m  BP Readings from Last 3 Encounters:  06/04/23 130/84  03/14/23 136/84  03/06/23 126/80   Wt Readings from Last 3 Encounters:  06/04/23 209 lb 14.4 oz (95.2 kg)  03/14/23 207 lb 8 oz (94.1 kg)  03/01/23 210 lb 12.2 oz (95.6 kg)      Physical Exam Constitutional:      Appearance: Normal appearance.  HENT:     Head: Normocephalic and atraumatic.  Eyes:     Conjunctiva/sclera: Conjunctivae normal.  Skin:    General: Skin is warm and dry.  Neurological:     General: No focal deficit present.     Mental  Status: She is alert. Mental status is at baseline.  Psychiatric:        Mood and Affect: Mood normal.        Behavior: Behavior normal.      No results found for any visits on 06/04/23.  Last CBC Lab Results  Component Value Date   WBC 4.9 03/04/2023   HGB 10.8 (L) 03/04/2023   HCT 32.5 (L) 03/04/2023   MCV 87.4 03/04/2023   MCH 29.0 03/04/2023   RDW 14.7 03/04/2023   PLT 159 03/04/2023   Last metabolic panel Lab Results  Component Value Date   GLUCOSE 110 (H) 03/06/2023   NA 138 03/06/2023   K 4.0 03/06/2023   CL 103 03/06/2023   CO2 27 03/06/2023   BUN 11 03/06/2023   CREATININE 0.79 03/06/2023   GFRNONAA >60 03/06/2023   CALCIUM 8.8 (L) 03/06/2023   PROT 8.5 (H) 02/28/2023   ALBUMIN 4.4 02/28/2023   BILITOT 0.8 02/28/2023   ALKPHOS 50 02/28/2023   AST 22 02/28/2023   ALT 17 02/28/2023   ANIONGAP 8 03/06/2023   Last lipids Lab Results  Component Value Date   CHOL 168 08/21/2022   HDL 48 (L) 08/21/2022   LDLCALC 106 (H) 08/21/2022   TRIG 55 08/21/2022   CHOLHDL 3.5 08/21/2022   Last hemoglobin A1c Lab Results  Component Value Date   HGBA1C 5.9 (A) 02/14/2023   Last thyroid functions Lab Results  Component Value Date   TSH 1.80 02/27/2021   Last vitamin D Lab Results  Component Value Date   VD25OH 36 08/21/2022   Last vitamin B12 and Folate No results found for: "  VITAMINB12", "FOLATE"    The ASCVD Risk score (Arnett DK, et al., 2019) failed to calculate for the following reasons:   Risk score cannot be calculated because patient has a medical history suggesting prior/existing ASCVD    Assessment & Plan:   1. Encounter for completion of form with patient (Primary)/Hypertension, benign/History of CVA (cerebrovascular accident): Form filled out for the patient stating that she requires no driving restrictions after her CVA in 05/2021. Forms filled out, uploaded and faxed. BP stable, no change made to medications.    Return for already  scheduled.    Margarita Mail, DO

## 2023-06-20 ENCOUNTER — Telehealth: Payer: Medicare Other | Admitting: Physician Assistant

## 2023-06-20 ENCOUNTER — Telehealth: Payer: Medicare Other | Admitting: Family Medicine

## 2023-06-20 DIAGNOSIS — U071 COVID-19: Secondary | ICD-10-CM | POA: Diagnosis not present

## 2023-06-20 DIAGNOSIS — M7989 Other specified soft tissue disorders: Secondary | ICD-10-CM

## 2023-06-20 MED ORDER — BENZONATATE 100 MG PO CAPS: 100.0000 mg | ORAL_CAPSULE | Freq: Three times a day (TID) | ORAL | 0 refills | Status: DC | PRN

## 2023-06-20 MED ORDER — NIRMATRELVIR/RITONAVIR (PAXLOVID)TABLET: 3.0000 | ORAL_TABLET | Freq: Two times a day (BID) | ORAL | 0 refills | Status: AC

## 2023-06-20 NOTE — Patient Instructions (Addendum)
 Terri Werner, thank you for joining Terri Velma Lunger, PA-C for today's virtual visit.  While this provider is not your primary Werner provider (PCP), if your PCP is located in our provider database this encounter information will be shared with them immediately following your visit.   Terri Werner account gives you access to today's visit and all your visits, tests, and labs performed at Terri Werner  click here if you don't have Terri Terri Werner account or go to [email or web address]  Consent: (Patient) Terri Werner provided verbal consent for this virtual visit at the beginning of the encounter.  Current Medications:  Current Outpatient Medications:    acetaminophen  (TYLENOL ) 325 MG tablet, Take 2 tablets (650 mg total) by mouth every 4 (four) hours as needed for mild pain (or temp > 37.5 C (99.5 F))., Disp: , Rfl:    amLODipine  (NORVASC ) 5 MG tablet, Take 1 tablet (5 mg total) by mouth daily., Disp: 90 tablet, Rfl: 1   bisacodyl (DULCOLAX) 5 MG EC tablet, Take 5 mg by mouth daily as needed for moderate constipation., Disp: , Rfl:    cyclobenzaprine  (FLEXERIL ) 5 MG tablet, Take 1 tablet (5 mg total) by mouth at bedtime as needed for muscle spasms., Disp: 90 tablet, Rfl: 1   lidocaine  (XYLOCAINE ) 5 % ointment, Apply 1 Application topically as needed., Disp: 35.44 g, Rfl: 2   polyethylene glycol powder (GLYCOLAX /MIRALAX ) 17 GM/SCOOP powder, Take 17 g by mouth daily as needed for moderate constipation., Disp: 3350 g, Rfl: 1   rosuvastatin  (CRESTOR ) 10 MG tablet, Take 1 tablet (10 mg total) by mouth daily., Disp: 90 tablet, Rfl: 1   sennosides-docusate sodium  (SENOKOT-S) 8.6-50 MG tablet, Take 1 tablet by mouth daily. Takes as needed, Disp: , Rfl:    valsartan -hydrochlorothiazide  (DIOVAN -HCT) 80-12.5 MG tablet, Take 1 tablet by mouth daily., Disp: 90 tablet, Rfl: 1   Medications ordered in this encounter:  No orders of the defined types  were placed in this encounter.    *If you need refills on other medications prior to your next appointment, please contact your pharmacy*  Follow-Up: Call back or seek an in-person evaluation if the symptoms worsen or if the condition fails to improve as anticipated.  Terri Werner [phone]  Werner Instructions: Please keep well-hydrated and get plenty of rest. Start Terri saline nasal rinse to flush out your nasal passages. You can use plain Mucinex to help thin congestion. If you have Terri humidifier, running in the bedroom at night. I want you to start OTC vitamin D3 1000 units daily, vitamin C 1000 mg daily, and Terri zinc supplement. Please take prescribed medications as directed. Hold your Crestor  while taking the Paxlovid .       Isolation Instructions: You are to isolate at home until you have been fever free for at least 24 hours without Terri fever-reducing medication, and symptoms have been steadily improving for 24 hours. At that time,  you can end isolation but need to mask for an additional 5 days.   If you must be around other household members who do not have symptoms, you need to make sure that both you and the family members are masking consistently with Terri high-quality mask.  If you note any worsening of symptoms despite treatment, please seek an in-person evaluation ASAP. If you note any significant shortness of breath or any chest pain, please seek ER evaluation. Please do not delay Werner!   COVID-19: What to Do if You  Are Sick If you test positive and are an older adult or someone who is at high risk of getting very sick from COVID-19, treatment may be available. Contact Terri healthcare provider right away after Terri positive test to determine if you are eligible, even if your symptoms are mild right now. You can also visit Terri Test to Treat location and, if eligible, receive Terri prescription from Terri provider. Don't delay: Treatment must be started within the first few days to  be effective. If you have Terri fever, cough, or other symptoms, you might have COVID-19. Most people have mild illness and are able to recover at home. If you are sick: Keep track of your symptoms. If you have an emergency warning sign (including trouble breathing), call 911. Steps to help prevent the spread of COVID-19 if you are sick If you are sick with COVID-19 or think you might have COVID-19, follow the steps below to Werner for yourself and to help protect other people in your home and community. Stay home except to get medical Werner Stay home. Most people with COVID-19 have mild illness and can recover at home without medical Werner. Do not leave your home, except to get medical Werner. Do not visit public areas and do not go to places where you are unable to wear Terri mask. Take Werner of yourself. Get rest and stay hydrated. Take over-the-counter medicines, such as acetaminophen , to help you feel better. Stay in touch with your doctor. Call before you get medical Werner. Be sure to get Werner if you have trouble breathing, or have any other emergency warning signs, or if you think it is an emergency. Avoid public transportation, ride-sharing, or taxis if possible. Get tested If you have symptoms of COVID-19, get tested. While waiting for test results, stay away from others, including staying apart from those living in your household. Get tested as soon as possible after your symptoms start. Treatments may be available for people with COVID-19 who are at risk for becoming very sick. Don't delay: Treatment must be started early to be effective--some treatments must begin within 5 days of your first symptoms. Contact your healthcare provider right away if your test result is positive to determine if you are eligible. Self-tests are one of several options for testing for the virus that causes COVID-19 and may be more convenient than laboratory-based tests and point-of-Werner tests. Ask your healthcare provider or  your local health department if you need help interpreting your test results. You can visit your state, tribal, local, and territorial health department's website to look for the latest local information on testing sites. Separate yourself from other people As much as possible, stay in Terri specific room and away from other people and pets in your home. If possible, you should use Terri separate bathroom. If you need to be around other people or animals in or outside of the home, wear Terri well-fitting mask. Tell your close contacts that they may have been exposed to COVID-19. An infected person can spread COVID-19 starting 48 hours (or 2 days) before the person has any symptoms or tests positive. By letting your close contacts know they may have been exposed to COVID-19, you are helping to protect everyone. See COVID-19 and Animals if you have questions about pets. If you are diagnosed with COVID-19, someone from the health department may call you. Answer the call to slow the spread. Monitor your symptoms Symptoms of COVID-19 include fever, cough, or other symptoms. Follow Werner instructions from  your healthcare provider and local health department. Your local health authorities may give instructions on checking your symptoms and reporting information. When to seek emergency medical attention Look for emergency warning signs* for COVID-19. If someone is showing any of these signs, seek emergency medical Werner immediately: Trouble breathing Persistent pain or pressure in the chest New confusion Inability to wake or stay awake Pale, gray, or blue-colored skin, lips, or nail beds, depending on skin tone *This list is not all possible symptoms. Please call your medical provider for any other symptoms that are severe or concerning to you. Call 911 or call ahead to your local emergency facility: Notify the operator that you are seeking Werner for someone who has or may have COVID-19. Call ahead before visiting your  doctor Call ahead. Many medical visits for routine Werner are being postponed or done by phone or telemedicine. If you have Terri medical appointment that cannot be postponed, call your doctor's office, and tell them you have or may have COVID-19. This will help the office protect themselves and other patients. If you are sick, wear Terri well-fitting mask You should wear Terri mask if you must be around other people or animals, including pets (even at home). Wear Terri mask with the best fit, protection, and comfort for you. You don't need to wear the mask if you are alone. If you can't put on Terri mask (because of trouble breathing, for example), cover your coughs and sneezes in some other way. Try to stay at least 6 feet away from other people. This will help protect the people around you. Masks should not be placed on young children under age 70 years, anyone who has trouble breathing, or anyone who is not able to remove the mask without help. Cover your coughs and sneezes Cover your mouth and nose with Terri tissue when you cough or sneeze. Throw away used tissues in Terri lined trash can. Immediately wash your hands with soap and water  for at least 20 seconds. If soap and water  are not available, clean your hands with an alcohol-based hand sanitizer that contains at least 60% alcohol. Clean your hands often Wash your hands often with soap and water  for at least 20 seconds. This is especially important after blowing your nose, coughing, or sneezing; going to the bathroom; and before eating or preparing food. Use hand sanitizer if soap and water  are not available. Use an alcohol-based hand sanitizer with at least 60% alcohol, covering all surfaces of your hands and rubbing them together until they feel dry. Soap and water  are the best option, especially if hands are visibly dirty. Avoid touching your eyes, nose, and mouth with unwashed hands. Handwashing Tips Avoid sharing personal household items Do not share dishes,  drinking glasses, cups, eating utensils, towels, or bedding with other people in your home. Wash these items thoroughly after using them with soap and water  or put in the dishwasher. Clean surfaces in your home regularly Clean and disinfect high-touch surfaces (for example, doorknobs, tables, handles, light switches, and countertops) in your sick room and bathroom. In shared spaces, you should clean and disinfect surfaces and items after each use by the person who is ill. If you are sick and cannot clean, Terri caregiver or other person should only clean and disinfect the area around you (such as your bedroom and bathroom) on an as needed basis. Your caregiver/other person should wait as long as possible (at least several hours) and wear Terri mask before entering, cleaning, and  disinfecting shared spaces that you use. Clean and disinfect areas that may have blood, stool, or body fluids on them. Use household cleaners and disinfectants. Clean visible dirty surfaces with household cleaners containing soap or detergent. Then, use Terri household disinfectant. Use Terri product from [employer] List N: Disinfectants for Coronavirus (COVID-19). Be sure to follow the instructions on the label to ensure safe and effective use of the product. Many products recommend keeping the surface wet with Terri disinfectant for Terri certain period of time (look at contact time on the product label). You may also need to wear personal protective equipment, such as gloves, depending on the directions on the product label. Immediately after disinfecting, wash your hands with soap and water  for 20 seconds. For completed guidance on cleaning and disinfecting your home, visit Complete Disinfection Guidance. Take steps to improve ventilation at home Improve ventilation (air flow) at home to help prevent from spreading COVID-19 to other people in your household. Clear out COVID-19 virus particles in the air by opening windows, using air filters, and  turning on fans in your home. Use this interactive tool to learn how to improve air flow in your home. When you can be around others after being sick with COVID-19 Deciding when you can be around others is different for different situations. Find out when you can safely end home isolation. For any additional questions about your Werner, contact your healthcare provider or state or local health department. 08/02/2020 Content source: [place] [facility] and [facility] ([facility]), Division of Viral Diseases This information is not intended to replace advice given to you by your health Werner provider. Make sure you discuss any questions you have with your health Werner provider. Document Revised: 09/15/2020 Document Reviewed: 09/15/2020 Elsevier Patient Education  2022 [employer].   If you have been instructed to have an in-person evaluation today at Terri local Urgent Werner facility, please use the link below. It will take you to Terri list of all of our available [facility], including address, phone number and hours of operation. Please do not delay Werner.  [facility]  If you or Terri family member do not have Terri primary Werner provider, use the link below to schedule Terri visit and establish Werner. When you choose Terri [place] primary Werner physician or advanced practice provider, you gain Terri long-term partner in health. Find Terri Primary Werner Provider  Learn more about [place]'s in-office and virtual Werner options: [place] - Get Werner Now

## 2023-06-20 NOTE — Progress Notes (Signed)
   Thank you for the details you included in the comment boxes. Those details are very helpful in determining the best course of treatment for you and help us  to provide the best care.Because you have leg swelling- and a fever- , we recommend that you schedule a Virtual Urgent Care video visit in order for the provider to better assess what is going on.  The provider will be able to give you a more accurate diagnosis and treatment plan if we can more freely discuss your symptoms and with the addition of a virtual examination.   If you change your visit to a video visit, we will bill your insurance (similar to an office visit) and you will not be charged for this e-Visit. You will be able to stay at home and speak with the first available [facility] advanced practice provider. The link to do a video visit is in the drop down Menu tab of your Welcome screen in MyChart.

## 2023-06-20 NOTE — Progress Notes (Signed)
 Virtual Visit Consent   Terri Werner, you are scheduled for a virtual visit with a [facility] provider today. Just as with appointments in the office, your consent must be obtained to participate. Your consent will be active for this visit and any virtual visit you may have with one of our providers in the next 365 days. If you have a MyChart account, a copy of this consent can be sent to you electronically.  As this is a virtual visit, video technology does not allow for your provider to perform a traditional examination. This may limit your provider's ability to fully assess your condition. If your provider identifies any concerns that need to be evaluated in person or the need to arrange testing (such as labs, EKG, etc.), we will make arrangements to do so. Although advances in technology are sophisticated, we cannot ensure that it will always work on either your end or our end. If the connection with a video visit is poor, the visit may have to be switched to a telephone visit. With either a video or telephone visit, we are not always able to ensure that we have a secure connection.  By engaging in this virtual visit, you consent to the provision of healthcare and authorize for your insurance to be billed (if applicable) for the services provided during this visit. Depending on your insurance coverage, you may receive a charge related to this service.  I need to obtain your verbal consent now. Are you willing to proceed with your visit today? Terri Werner has provided verbal consent on 06/20/2023 for a virtual visit (video or telephone). Terri Werner, [place]  Date: 06/20/2023 4:45 PM  Virtual Visit via Video Note   I, Terri Werner, connected with  Terri Werner  ([id number], 08/25/1950) on 06/20/23 at  4:45 PM EST by a video-enabled telemedicine application and verified that I am speaking with the correct person using two  identifiers.  Location: Patient: Virtual Visit Location Patient: Home Provider: Virtual Visit Location Provider: Home Office   I discussed the limitations of evaluation and management by telemedicine and the availability of in person appointments. The patient expressed understanding and agreed to proceed.    History of Present Illness: Terri Werner is a 73 y.o. who identifies as a female who was assigned female at birth, and is being seen today for COVID-19. Endorses symptoms starting 2/4 into 2/5 with chills, aches, nasal congestion, cough and fatigue. Initially with some ankle swelling that resolved. Denies chest pain or SOB. Denies fever at present. Has had COVID once back in 2022. Denies need for hospitalization. Tested positive for COVID 2/5.[facility]RA  OTC -- Mucinex-DM.    HPI: HPI  Problems:  Patient Active Problem List   Diagnosis Date Noted   History of CVA (cerebrovascular accident) 06/04/2023   SBO (small bowel obstruction) (HCC) 03/01/2023   Partial bowel obstruction (HCC) 02/28/2023   Hx of spontaneous intraparenchymal intracranial hemorrhage 07/12/2021   Intraparenchymal hemorrhage of brain (HCC) 05/31/2021   ICH (intracerebral hemorrhage) (HCC) 05/27/2021   Morbid obesity (HCC) 09/24/2018   Unspecified inflammatory spondylopathy, lumbar region (HCC) 03/05/2018   Lumbar spondylolysis 08/20/2017   Lumbar facet arthropathy 08/20/2017   Lumbar degenerative disc disease 08/20/2017   Chronic constipation 07/30/2017   Primary osteoarthritis involving multiple joints 04/17/2017   Postmenopausal 10/12/2016   Prediabetes 10/12/2016   Lumbago 08/04/2015   Diverticulitis of colon 07/05/2014   Carotid artery disorder (HCC) 04/27/2014   Hyperlipidemia 10/08/2013  Hypertension, benign 11/19/2009    Allergies:  Allergies  Allergen Reactions   Latex Itching, Rash and Anaphylaxis    Burning   Shellfish-Derived Products Anaphylaxis   Shrimp [Shellfish Allergy] Swelling     Lips will burn    Medications:  Current Outpatient Medications:    nirmatrelvir /ritonavir  (PAXLOVID ) 20 x 150 MG & 10 x 100MG  TABS, Take 3 tablets by mouth 2 (two) times daily for 5 days. (Take nirmatrelvir  150 mg two tablets twice daily for 5 days and ritonavir  100 mg one tablet twice daily for 5 days) Patient GFR is > 60, Disp: 30 tablet, Rfl: 0   acetaminophen  (TYLENOL ) 325 MG tablet, Take 2 tablets (650 mg total) by mouth every 4 (four) hours as needed for mild pain (or temp > 37.5 C (99.5 F))., Disp: , Rfl:    amLODipine  (NORVASC ) 5 MG tablet, Take 1 tablet (5 mg total) by mouth daily., Disp: 90 tablet, Rfl: 1   bisacodyl (DULCOLAX) 5 MG EC tablet, Take 5 mg by mouth daily as needed for moderate constipation., Disp: , Rfl:    cyclobenzaprine  (FLEXERIL ) 5 MG tablet, Take 1 tablet (5 mg total) by mouth at bedtime as needed for muscle spasms., Disp: 90 tablet, Rfl: 1   lidocaine  (XYLOCAINE ) 5 % ointment, Apply 1 Application topically as needed., Disp: 35.44 g, Rfl: 2   polyethylene glycol powder (GLYCOLAX /MIRALAX ) 17 GM/SCOOP powder, Take 17 g by mouth daily as needed for moderate constipation., Disp: 3350 g, Rfl: 1   rosuvastatin  (CRESTOR ) 10 MG tablet, Take 1 tablet (10 mg total) by mouth daily., Disp: 90 tablet, Rfl: 1   sennosides-docusate sodium  (SENOKOT-S) 8.6-50 MG tablet, Take 1 tablet by mouth daily. Takes as needed, Disp: , Rfl:    valsartan -hydrochlorothiazide  (DIOVAN -HCT) 80-12.5 MG tablet, Take 1 tablet by mouth daily., Disp: 90 tablet, Rfl: 1  Observations/Objective: Patient is well-developed, well-nourished in no acute distress.  Resting comfortably at home.  Head is normocephalic, atraumatic.  No labored breathing. Speech is clear and coherent with logical content.  Patient is alert and oriented at baseline.   Assessment and Plan: 1. COVID-19 (Primary)  Patient with multiple risk factors for complicated course of illness. Discussed risks/benefits of antiviral  medications including most common potential ADRs. Patient voiced understanding and would like to proceed with antiviral medication. They are candidate for Paxlovid  . Rx sent to pharmacy. Supportive measures, OTC medications and vitamin regimen reviewed. Tessalon  per orders. Quarantine reviewed in detail. Strict ER precautions discussed with patient.    Follow Up Instructions: I discussed the assessment and treatment plan with the patient. The patient was provided an opportunity to ask questions and all were answered. The patient agreed with the plan and demonstrated an understanding of the instructions.  A copy of instructions were sent to the patient via MyChart unless otherwise noted below.  The patient was advised to call back or seek an in-person evaluation if the symptoms worsen or if the condition fails to improve as anticipated.    Terri Velma Lunger, PA-C

## 2023-07-25 ENCOUNTER — Ambulatory Visit: Payer: Medicare Other

## 2023-07-25 VITALS — BP 128/82 | Ht 62.0 in | Wt 215.7 lb

## 2023-07-25 DIAGNOSIS — Z0000 Encounter for general adult medical examination without abnormal findings: Secondary | ICD-10-CM

## 2023-07-25 DIAGNOSIS — Z780 Asymptomatic menopausal state: Secondary | ICD-10-CM

## 2023-07-25 NOTE — Progress Notes (Signed)
 Subjective:   Terri Werner is a 73 y.o. who presents for a Medicare Wellness preventive visit.  Visit Complete: In person   Persons Participating in Visit: Patient.  AWV Questionnaire: No: Patient Medicare AWV questionnaire was not completed prior to this visit.  Cardiac Risk Factors include: advanced age (>6men, >8 women);dyslipidemia;hypertension;obesity (BMI >30kg/m2)     Objective:    Today's Vitals   07/25/23 1308 07/25/23 1313  BP: 128/82   Weight: 215 lb 11.2 oz (97.8 kg)   Height: 5\' 2"  (1.575 m)   PainSc:  3    Body mass index is 39.45 kg/m.     07/25/2023    1:22 PM 03/01/2023   11:49 AM 02/28/2023    5:26 PM 02/28/2023   11:09 AM 07/19/2022    1:25 PM 02/27/2022    2:42 PM 02/19/2022    9:04 AM  Advanced Directives  Does Patient Have a Medical Advance Directive? No Yes Yes Yes Yes Yes Yes  Type of Science writer of [employer] Power of [place];Living will  Healthcare Power of [place];Living will Healthcare Power of [place];Living will  Does patient want to make changes to medical advance directive?  No - Patient declined No - Patient declined    No - Patient declined  Copy of Healthcare Power of Attorney in Chart?  Yes - validated most recent copy scanned in chart (See row information) Yes - validated most recent copy scanned in chart (See row information)    No - copy requested  Would patient like information on creating a medical advance directive? No - Patient declined          Current Medications (verified) Outpatient Encounter Medications as of 07/25/2023  Medication Sig   acetaminophen (TYLENOL) 325 MG tablet Take 2 tablets (650 mg total) by mouth every 4 (four) hours as needed for mild pain (or temp > 37.5 C (99.5 F)).   amLODipine (NORVASC) 5 MG tablet Take 1 tablet (5 mg total) by mouth daily.   bisacodyl (DULCOLAX) 5 MG EC tablet Take 5 mg by mouth daily as needed for  moderate constipation.   cyclobenzaprine (FLEXERIL) 5 MG tablet Take 1 tablet (5 mg total) by mouth at bedtime as needed for muscle spasms.   lidocaine (XYLOCAINE) 5 % ointment Apply 1 Application topically as needed.   polyethylene glycol powder (GLYCOLAX/MIRALAX) 17 GM/SCOOP powder Take 17 g by mouth daily as needed for moderate constipation.   rosuvastatin (CRESTOR) 10 MG tablet Take 1 tablet (10 mg total) by mouth daily.   sennosides-docusate sodium (SENOKOT-S) 8.6-50 MG tablet Take 1 tablet by mouth daily. Takes as needed   valsartan-hydrochlorothiazide (DIOVAN-HCT) 80-12.5 MG tablet Take 1 tablet by mouth daily.   [DISCONTINUED] benzonatate (TESSALON) 100 MG capsule Take 1 capsule (100 mg total) by mouth 3 (three) times daily as needed for cough.   No facility-administered encounter medications on file as of 07/25/2023.    Allergies (verified) Latex, Shellfish-derived products, Shrimp [shellfish allergy], and Lisinopril   History: Past Medical History:  Diagnosis Date   Arthritis    pt reports in back & knees   Colon polyps 2010   At [facility]   Diverticulitis 2016   Genital warts    GERD (gastroesophageal reflux disease)    Heart murmur    Hepatitis B    History of blood transfusion    during each major surgery per pt   History of chicken pox    History  of hiatal hernia    History of torn meniscus of right knee    HTN (hypertension)    Hx of migraine headaches    Hypertension    Hypopotassemia    Pre-diabetes    Stroke (HCC) 05/27/2021   Past Surgical History:  Procedure Laterality Date   ABDOMINAL HYSTERECTOMY  2006   APPENDECTOMY  1980   CESAREAN SECTION  1981   COLONOSCOPY WITH PROPOFOL N/A 11/01/2014   Procedure: COLONOSCOPY WITH PROPOFOL;  Surgeon: Scot Jun, MD;  [facility] END[facility]COPY;  Service: Endoscopy;  Laterality: N/A;   COLONOSCOPY WITH PROPOFOL N/A 01/05/2020   Procedure: COLONOSCOPY WITH PROPOFOL;  Surgeon: Regis Bill, MD;  Location: [facility]  ENDOSCOPY;  Service: Endoscopy;  Laterality: N/A;   CYSTOSCOPY WITH BIOPSY N/A 02/19/2022   Procedure: CYSTOSCOPY WITH BLADDER BIOPSY;  Surgeon: Vanna Scotland, MD;  Location: [facility] ORS;  Service: Urology;  Laterality: N/A;   ECTOPIC PREGNANCY SURGERY  1980   HERNIA REPAIR  2006   Umbilical   INCISIONAL HERNIA REPAIR N/A 03/01/2023   Procedure: PRIMARY REPAIR OF INCISIONAL HERNIA;  Surgeon: Sung Amabile, DO;  Location: [facility] ORS;  Service: General;  Laterality: N/A;   KNEE ARTHROSCOPY Left 2006   torn meniscus   LAPAROTOMY N/A 03/01/2023   Procedure: EXPLORATORY LAPAROTOMY;  Surgeon: Sung Amabile, DO;  Location: [facility] ORS;  Service: General;  Laterality: N/A;   LYSIS OF ADHESION N/A 03/01/2023   Procedure: LYSIS OF ADHESIONS;  Surgeon: Sung Amabile, DO;  Location: [facility] ORS;  Service: General;  Laterality: N/A;   TONSILLECTOMY  1971   TOTAL ABDOMINAL HYSTERECTOMY W/ BILATERAL SALPINGOOPHORECTOMY  2006   VESICOVAGINAL FISTULA CLOSURE W/ TAH     Family History  Problem Relation Age of Onset   Breast cancer Sister 47   Hypertension Mother    Alzheimer's disease Mother    Dementia Mother    Hypertension Maternal Grandfather    Hyperlipidemia Maternal Grandfather    Birth defects Maternal Grandmother        Bladder   Colon cancer Maternal Grandmother    Congestive Heart Failure Father    Hyperlipidemia Father    Dementia Paternal Grandmother    Dementia Paternal Grandfather    Thyroid disease Sister    Social History   Socioeconomic History   Marital status: Widowed    Spouse name: Not on file   Number of children: 2   Years of education: Not on file   Highest education level: Master's degree (e.g., MA, MS, MEng, MEd, MSW, MBA)  Occupational History   Occupation: [employer] [place] [employer]    Comment: Full Time    Employer: [employer]  Tobacco Use   Smoking status: Never   Smokeless tobacco: Never  Vaping Use   Vaping status: Never Used   Substance and Sexual Activity   Alcohol use: Not Currently    Comment: rare   Drug use: No   Sexual activity: Not Currently    Partners: Male  Other Topics Concern   Not on file  Social History Narrative   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      Regular Exercise -  NO   Daily Caffeine Use:  1 soda/tea          Social Drivers of Health   Financial Resource Strain: Medium Risk (07/25/2023)   Overall Financial Resource Strain (CARDIA)  Difficulty of Paying Living Expenses: Somewhat hard  Food Insecurity: No Food Insecurity (07/25/2023)   Hunger Vital Sign    Worried About Running Out of Food in the Last Year: Never true    Ran Out of Food in the Last Year: Never true  Recent Concern: Food Insecurity - Food Insecurity Present (06/02/2023)   Hunger Vital Sign    Worried About Running Out of Food in the Last Year: Sometimes true    Ran Out of Food in the Last Year: Sometimes true  Transportation Needs: No Transportation Needs (07/25/2023)   PRAPARE - Administrator, Civil Service (Medical): No    Lack of Transportation (Non-Medical): No  Physical Activity: Sufficiently Active (07/25/2023)   Exercise Vital Sign    Days of Exercise per Week: 3 days    Minutes of Exercise per Session: 90 min  Stress: No Stress Concern Present (07/25/2023)   [employer] of Occupational Health - Occupational Stress Questionnaire    Feeling of Stress : Not at all  Social Connections: Moderately Integrated (07/25/2023)   Social Connection and Isolation Panel [NHANES]    Frequency of Communication with Friends and Family: More than three times a week    Frequency of Social Gatherings with Friends and Family: More than three times a week    Attends Religious Services: More than 4 times per year    Active Member of [employer] or Organizations: Yes    Attends Banker Meetings: More than  4 times per year    Marital Status: Widowed    Tobacco Counseling Counseling given: Not Answered    Clinical Intake:  Pre-visit preparation completed: Yes  Pain : 0-10 Pain Score: 3  Pain Type: Chronic pain Pain Location: Knee Pain Orientation: Right, Left Pain Descriptors / Indicators: Aching, Heaviness, Constant, Pressure Pain Onset: More than a month ago Pain Frequency: Constant Pain Relieving Factors: sitting, elevating legs  Pain Relieving Factors: sitting, elevating legs  BMI - recorded: 39.45 Nutritional Status: BMI > 30  Obese Nutritional Risks: None Diabetes: No  How often do you need to have someone help you when you read instructions, pamphlets, or other written materials from your doctor or pharmacy?: 1 - Never  Interpreter Needed?: No  Information entered by :: Kennedy Bucker, LPN   Activities of Daily Living     07/25/2023    1:24 PM 07/24/2023   10:33 AM  In your present state of health, do you have any difficulty performing the following activities:  Hearing? 0 0  Vision? 0 0  Difficulty concentrating or making decisions? 0 0  Walking or climbing stairs? 1 1  Comment KNEES   Dressing or bathing? 0 0  Doing errands, shopping? 0 0  Preparing Food and eating ? N N  Using the Toilet? N N  In the past six months, have you accidently leaked urine? N N  Do you have problems with loss of bowel control? N N  Managing your Medications? N N  Managing your Finances? N N  Housekeeping or managing your Housekeeping? N N    Patient Care Team: Margarita Mail, DO as PCP - General (Internal Medicine) Antonieta Iba, MD as Consulting Physician (Cardiology) Edward Jolly, MD as Consulting Physician (Pain Medicine) Wyn Quaker Marlow Baars, MD as Referring Physician (Vascular Surgery) Scot Jun, MD (Inactive) (Gastroenterology) [employer]  Indicate any recent Medical Services you may have received from other than [facility] providers in the past year  (date may be  approximate).     Assessment:   This is a routine wellness examination for Terri Werner.  Hearing/Vision screen Hearing Screening - Comments:: NO AIDS Vision Screening - Comments:: WEARS GLASSES ALL THE TIME- [facility]   Goals Addressed             This Visit's Progress    DIET - EAT MORE FRUITS AND VEGETABLES         Depression Screen     07/25/2023    1:17 PM 06/04/2023    8:45 AM 03/14/2023    1:28 PM 02/14/2023    3:07 PM 08/24/2022   10:08 AM 08/21/2022    8:42 AM 07/19/2022    1:15 PM  PHQ 2/9 Scores  PHQ - 2 Score 0 0 0 0 0 0 0  PHQ- 9 Score 0  0 0  0     Fall Risk     07/25/2023    1:23 PM 07/24/2023   10:33 AM 06/04/2023    8:45 AM 04/04/2023    2:35 PM 03/14/2023    1:28 PM  Fall Risk   Falls in the past year? 1 1 0 1 0  Number falls in past yr: 0 0 0 0 0  Injury with Fall? 0 0 0 0 0  Risk for fall due to :   No Fall Risks    Follow up Falls evaluation completed;Falls prevention discussed  Falls evaluation completed      MEDICARE RISK AT HOME:  Medicare Risk at Home Any stairs in or around the home?: Yes If so, are there any without handrails?: No Home free of loose throw rugs in walkways, pet beds, electrical cords, etc?: Yes Adequate lighting in your home to reduce risk of falls?: Yes Life alert?: No Use of a cane, walker or w/c?: Yes (CANE WHEN OUT OF HOUSE) Grab bars in the bathroom?: Yes Shower chair or bench in shower?: Yes Elevated toilet seat or a handicapped toilet?: Yes  TIMED UP AND GO:  Was the test performed?  Yes  Length of time to ambulate 10 feet: 5 sec Gait slow and steady with assistive device  Cognitive Function: 6CIT completed        07/25/2023    1:25 PM 07/19/2022    1:29 PM  6CIT Screen  What Year? 0 points 0 points  What month? 0 points 0 points  What time? 0 points 0 points  Count back from 20 0 points 0 points  Months in reverse 0 points 0 points  Repeat phrase 0 points 0 points  Total Score 0 points  0 points    Immunizations Immunization History  Administered Date(s) Administered   Fluad Quad(high Dose 65+) 01/13/2019   Hepatitis A, Adult 09/11/2016, 03/22/2017   Influenza Split 04/14/2011   Influenza, High Dose Seasonal PF 01/15/2023   Influenza,inj,Quad PF,6+ Mos 01/18/2017   Influenza-Unspecified 03/06/2015, 01/29/2018, 12/12/2020, 02/09/2022, 01/15/2023   MODERNA COVID-19 SARS-COV-2 PEDS BIVALENT BOOSTER 31yr-77yr 05/25/2019, 06/22/2019, 03/29/2020   Meningococcal Conjugate 09/11/2016   Moderna Sars-Covid-2 Vaccination 05/25/2019, 06/22/2019, 03/29/2020, 01/15/2023   PNEUMOCOCCAL CONJUGATE-20 08/21/2022   Pneumococcal Conjugate-13 03/05/2018   Pneumococcal Polysaccharide-23 02/18/2013, 04/17/2019   RSV,unspecified 07/08/2022   Tdap 07/06/2015   Typhoid Inactivated 09/11/2016   Yellow Fever 09/11/2016   Zoster Recombinant(Shingrix) 05/26/2022, 08/14/2022   Zoster, Live 07/06/2015    Screening Tests Health Maintenance  Topic Date Due   COVID-19 Vaccine (8 - 2024-25 season) 03/12/2023   DEXA SCAN  04/15/2023   MAMMOGRAM  12/03/2023  Medicare Annual Wellness (AWV)  07/24/2024   Colonoscopy  05/24/2026   Pneumonia Vaccine 94+ Years old  Completed   INFLUENZA VACCINE  Completed   Hepatitis C Screening  Completed   Zoster Vaccines- Shingrix  Completed   HPV VACCINES  Aged Out   DTaP/Tdap/Td  Discontinued    Health Maintenance  Health Maintenance Due  Topic Date Due   COVID-19 Vaccine (8 - 2024-25 season) 03/12/2023   DEXA SCAN  04/15/2023   Health Maintenance Items Addressed: DEXA ordered  Additional Screening:  Vision Screening: Recommended annual ophthalmology exams for early detection of glaucoma and other disorders of the eye.  Dental Screening: Recommended annual dental exams for proper oral hygiene  Community Resource Referral / Chronic Care Management: CRR required this visit?  No   CCM required this visit?  No     Plan:     I have  personally reviewed and noted the following in the patient's chart:   Medical and social history Use of alcohol, tobacco or illicit drugs  Current medications and supplements including opioid prescriptions. Patient is not currently taking opioid prescriptions. Functional ability and status Nutritional status Physical activity Advanced directives List of other physicians Hospitalizations, surgeries, and ER visits in previous 12 months Vitals Screenings to include cognitive, depression, and falls Referrals and appointments  In addition, I have reviewed and discussed with patient certain preventive protocols, quality metrics, and best practice recommendations. A written personalized care plan for preventive services as well as general preventive health recommendations were provided to patient.     Hal Hope, LPN   1/61/0960   After Visit Summary: (In Person-Declined) Patient declined AVS at this time.  Notes:  BONE DENSITY SCAN REFERRAL SENT

## 2023-07-25 NOTE — Patient Instructions (Addendum)
 Terri Werner , Thank you for taking time to come for your Medicare Wellness Visit. I appreciate your ongoing commitment to your health goals. Please review the following plan we discussed and let me know if I can assist you in the future.   Referrals/Orders/Follow-Ups/Clinician Recommendations: BONE DENSITY REFERRAL SENT  You have an order for:  []   2D Mammogram  []   3D Mammogram  [x]   Bone Density     Please call for appointment:  [facility]  [address] [place] [address] [phone] [facility] [address] [place], [place] [address] [phone] [facility] [address] Geanie Logan [place], [place] [address] [phone]   Make sure to wear two-piece clothing.  No lotions, powders, or deodorants the day of the appointment. Make sure to bring picture ID and insurance card.  Bring list of medications you are currently taking including any supplements.   Schedule your [place] screening mammogram through MyChart!   Log into your MyChart account.  Go to 'Visit' (or 'Appointments' if on mobile App) --> Schedule an Appointment  Under 'Select a Reason for Visit' choose the Mammogram Screening option.  Complete the pre-visit questions and select the time and place that best fits your schedule.   This is a list of the screening recommended for you and due dates:  Health Maintenance  Topic Date Due   COVID-19 Vaccine (8 - 2024-25 season) 03/12/2023   DEXA scan (bone density measurement)  04/15/2023   Mammogram  12/03/2023   Medicare Annual Wellness Visit  07/24/2024   Colon Cancer Screening  05/24/2026   Pneumonia Vaccine  Completed   Flu Shot  Completed   Hepatitis C Screening  Completed   Zoster (Shingles) Vaccine  Completed   HPV Vaccine  Aged Out   DTaP/Tdap/Td vaccine  Discontinued    Advanced directives: (ACP Link)Information on  Advanced Care Planning can be found at [facility] Advance Health Care Directives Advance Health Care Directives. [email or web address]   Next Medicare Annual Wellness Visit scheduled for next year: Yes   07/30/24 @ 1:10 PM IN PERSON

## 2023-07-26 ENCOUNTER — Encounter: Payer: Self-pay | Admitting: Internal Medicine

## 2023-07-26 ENCOUNTER — Ambulatory Visit
Admission: RE | Admit: 2023-07-26 | Discharge: 2023-07-26 | Disposition: A | Discharge status: Home / Self Care | Source: Ambulatory Visit | Attending: Internal Medicine | Admitting: Internal Medicine

## 2023-07-26 ENCOUNTER — Other Ambulatory Visit: Payer: Self-pay

## 2023-07-26 ENCOUNTER — Ambulatory Visit (INDEPENDENT_AMBULATORY_CARE_PROVIDER_SITE_OTHER): Admitting: Internal Medicine

## 2023-07-26 VITALS — BP 136/84 | HR 94 | Temp 98.1°F | Resp 16 | Ht 62.0 in | Wt 217.4 lb

## 2023-07-26 DIAGNOSIS — M7989 Other specified soft tissue disorders: Secondary | ICD-10-CM

## 2023-07-26 DIAGNOSIS — R600 Localized edema: Secondary | ICD-10-CM | POA: Diagnosis not present

## 2023-07-26 MED ORDER — FUROSEMIDE 20 MG PO TABS: 20.0000 mg | ORAL_TABLET | Freq: Every day | ORAL | 0 refills | Status: DC | PRN

## 2023-07-26 NOTE — Progress Notes (Signed)
   Acute Office Visit  Subjective:     Patient ID: Terri Werner, female    DOB: October 24, 1950, 73 y.o.   MRN: [id number]  Chief Complaint  Patient presents with   Leg Pain    Right leg for 1 month (swelling)    Leg Pain    Patient is in today for right leg pain and swelling. Noticed this about 2-3 weeks ago, will be swollen and resolve and then swell again. Does have chronic edema but much worse lately. No blisters or dry skin. No redness, mild pain in the right calf due to swelling. No shortness of breath or chest pain.  Review of Systems  Constitutional:  Negative for chills and fever.  Respiratory:  Negative for shortness of breath.   Cardiovascular:  Positive for leg swelling. Negative for chest pain.  Skin: Negative.         Objective:    BP 136/84 (Cuff Size: Large)   Pulse 94   Temp 98.1 F (36.7 C) (Oral)   Resp 16   Ht 5\' 2"  (1.575 m)   Wt 217 lb 6.4 oz (98.6 kg)   SpO2 99%   BMI 39.76 kg/m  BP Readings from Last 3 Encounters:  07/26/23 136/84  07/25/23 128/82  06/04/23 130/84   Wt Readings from Last 3 Encounters:  07/26/23 217 lb 6.4 oz (98.6 kg)  07/25/23 215 lb 11.2 oz (97.8 kg)  06/04/23 209 lb 14.4 oz (95.2 kg)      Physical Exam Constitutional:      Appearance: Normal appearance.  HENT:     Head: Normocephalic and atraumatic.  Eyes:     Conjunctiva/sclera: Conjunctivae normal.  Cardiovascular:     Rate and Rhythm: Normal rate and regular rhythm.  Pulmonary:     Effort: Pulmonary effort is normal.     Breath sounds: Normal breath sounds. No rales.  Musculoskeletal:     Right lower leg: Edema present.     Left lower leg: Edema present.     Comments: Bilateral pitting edema in the lower extremities but significantly more in the right lower extremity, about 2+ edema  Skin:    General: Skin is warm and dry.  Neurological:     General: No focal deficit present.     Mental Status: She is alert.  Psychiatric:        Mood and  Affect: Mood normal.        Behavior: Behavior normal.     No results found for any visits on 07/26/23.      Assessment & Plan:   1. Swelling of right lower extremity (Primary): Will send for stat venous US of right lower extremity. Decrease Amlodipine to 2.5 mg, continue other blood pressure medications and start Lasix 20 mg daily for the next 5-7 days, then daily as needed. Discussed keeping legs elevated and using compression stockings as well. Recheck scheduled in 2-3 weeks.   - furosemide (LASIX) 20 MG tablet; Take 1 tablet (20 mg total) by mouth daily as needed for edema or fluid.  Dispense: 20 tablet; Refill: 0 - US Venous Img Lower Unilateral Right (DVT); Future   Return for already scheduled.  Margarita Mail, DO

## 2023-07-26 NOTE — Patient Instructions (Addendum)
 It was great seeing you today!  Plan discussed at today's visit: -Ultrasound of leg today to assess for blood clots -Decrease Amlodipine to 2.5 mg daily -Start Lasix 20 mg daily for 5-7 days until fluid is improved, then can take as needed -Try to keep legs elevated at night and recommend compression stockings  Follow up in: already scheduled   Take care and let us know if you have any questions or concerns prior to your next visit.  Dr. Caralee Ates

## 2023-08-10 ENCOUNTER — Other Ambulatory Visit: Payer: Self-pay | Admitting: Internal Medicine

## 2023-08-10 DIAGNOSIS — E7800 Pure hypercholesterolemia, unspecified: Secondary | ICD-10-CM

## 2023-08-10 DIAGNOSIS — I10 Essential (primary) hypertension: Secondary | ICD-10-CM

## 2023-08-10 DIAGNOSIS — M62838 Other muscle spasm: Secondary | ICD-10-CM

## 2023-08-13 NOTE — Telephone Encounter (Signed)
 Requested medication (s) are due for refill today: yes  Requested medication (s) are on the active medication list: yes  Last refill:  diovan- 02/14/23 #90 1 refills, flexeril- 02/14/23 #90 1 refills  Future visit scheduled: yes in 2 days   Notes to clinic:  no refills remain, not delegated per protocol. Do you want to refill Rxs ?     Requested Prescriptions  Pending Prescriptions Disp Refills   valsartan-hydrochlorothiazide (DIOVAN-HCT) 80-12.5 MG tablet [Pharmacy Med Name: VALSARTAN-HCTZ 80-12.5 MG TAB] 90 tablet 1    Sig: TAKE 1 TABLET BY MOUTH EVERY DAY     Cardiovascular: ARB + Diuretic Combos Passed - 08/13/2023  9:35 AM      Passed - K in normal range and within 180 days    Potassium  Date Value Ref Range Status  03/06/2023 4.0 3.5 - 5.1 mmol/L Final  07/03/2012 3.7 3.5 - 5.1 mmol/L Final         Passed - Na in normal range and within 180 days    Sodium  Date Value Ref Range Status  03/06/2023 138 135 - 145 mmol/L Final  07/03/2012 141 136 - 145 mmol/L Final         Passed - Cr in normal range and within 180 days    Creat  Date Value Ref Range Status  08/21/2022 0.85 0.60 - 1.00 mg/dL Final   Creatinine, Ser  Date Value Ref Range Status  03/06/2023 0.79 0.44 - 1.00 mg/dL Final   Creatinine,U  Date Value Ref Range Status  04/15/2014 88.2 mg/dL Final         Passed - eGFR is 10 or above and within 180 days    GFR, Est African American  Date Value Ref Range Status  01/13/2019 60 > OR = 60 mL/min/1.7m2 Final   GFR, Est Non African American  Date Value Ref Range Status  01/13/2019 52 (L) > OR = 60 mL/min/1.63m2 Final   GFR, Estimated  Date Value Ref Range Status  03/06/2023 >60 >60 mL/min Final    Comment:    (NOTE) Calculated using the CKD-EPI Creatinine Equation (2021)    GFR  Date Value Ref Range Status  07/05/2014 82.53 >60.00 mL/min Final   eGFR  Date Value Ref Range Status  08/21/2022 74 > OR = 60 mL/min/1.5m2 Final         Passed -  Patient is not pregnant      Passed - Last BP in normal range    BP Readings from Last 1 Encounters:  07/26/23 136/84         Passed - Valid encounter within last 6 months    Recent Outpatient Visits           2 weeks ago Swelling of right lower extremity   [facility] Margarita Mail, DO       Future Appointments             In 2 days Margarita Mail, DO [facility]   In 5 months Carman Ching, [facility] [place]             cyclobenzaprine (FLEXERIL) 5 MG tablet [Pharmacy Med Name: CYCLOBENZAPRINE 5 MG TABLET] 90 tablet 1    Sig: TAKE 1 TABLET BY MOUTH AT BEDTIME AS NEEDED FOR MUSCLE SPASMS.     Not Delegated - Analgesics:  Muscle Relaxants Failed - 08/13/2023  9:35 AM      Failed - This refill  cannot be delegated      Passed - Valid encounter within last 6 months    Recent Outpatient Visits           2 weeks ago Swelling of right lower extremity   [facility] Margarita Mail, DO       Future Appointments             In 2 days Margarita Mail, DO [facility], [facility]   In 5 months [place], [place], [place] [facility] [place]            Signed Prescriptions Disp Refills   rosuvastatin (CRESTOR) 10 MG tablet 90 tablet 1    Sig: TAKE 1 TABLET BY MOUTH EVERY DAY     Cardiovascular:  Antilipid - Statins 2 Failed - 08/13/2023  9:35 AM      Failed - Lipid Panel in normal range within the last 12 months    Cholesterol  Date Value Ref Range Status  08/21/2022 168 <200 mg/dL Final   LDL Cholesterol (Calc)  Date Value Ref Range Status  08/21/2022 106 (H) mg/dL (calc) Final    Comment:    Reference range: <100 . Desirable range <100 mg/dL for primary prevention;   <70 mg/dL for patients with CHD or diabetic patients  with > or = 2 CHD risk factors. Marland Kitchen LDL-C is now calculated using the  Martin-Hopkins  calculation, which is a validated novel method providing  better accuracy than the Friedewald equation in the  estimation of LDL-C.  Horald Pollen et al. Lenox Ahr. 1610;960(45): 2061-2068  (http://education.[employer].com/faq/FAQ164)    HDL  Date Value Ref Range Status  08/21/2022 48 (L) > OR = 50 mg/dL Final   Triglycerides  Date Value Ref Range Status  08/21/2022 55 <150 mg/dL Final         Passed - Cr in normal range and within 360 days    Creat  Date Value Ref Range Status  08/21/2022 0.85 0.60 - 1.00 mg/dL Final   Creatinine, Ser  Date Value Ref Range Status  03/06/2023 0.79 0.44 - 1.00 mg/dL Final   Creatinine,U  Date Value Ref Range Status  04/15/2014 88.2 mg/dL Final         Passed - Patient is not pregnant      Passed - Valid encounter within last 12 months    Recent Outpatient Visits           2 weeks ago Swelling of right lower extremity   [facility] Margarita Mail, DO       Future Appointments             In 2 days Margarita Mail, DO [facility], [facility]   In 5 months Carman Ching, [facility] [place]

## 2023-08-13 NOTE — Telephone Encounter (Signed)
 Requested by interface surescripts. Future visit in 2 days. Requested Prescriptions  Pending Prescriptions Disp Refills   valsartan-hydrochlorothiazide (DIOVAN-HCT) 80-12.5 MG tablet [Pharmacy Med Name: VALSARTAN-HCTZ 80-12.5 MG TAB] 90 tablet 1    Sig: TAKE 1 TABLET BY MOUTH EVERY DAY     Cardiovascular: ARB + Diuretic Combos Passed - 08/13/2023  9:34 AM      Passed - K in normal range and within 180 days    Potassium  Date Value Ref Range Status  03/06/2023 4.0 3.5 - 5.1 mmol/L Final  07/03/2012 3.7 3.5 - 5.1 mmol/L Final         Passed - Na in normal range and within 180 days    Sodium  Date Value Ref Range Status  03/06/2023 138 135 - 145 mmol/L Final  07/03/2012 141 136 - 145 mmol/L Final         Passed - Cr in normal range and within 180 days    Creat  Date Value Ref Range Status  08/21/2022 0.85 0.60 - 1.00 mg/dL Final   Creatinine, Ser  Date Value Ref Range Status  03/06/2023 0.79 0.44 - 1.00 mg/dL Final   Creatinine,U  Date Value Ref Range Status  04/15/2014 88.2 mg/dL Final         Passed - eGFR is 10 or above and within 180 days    GFR, Est African American  Date Value Ref Range Status  01/13/2019 60 > OR = 60 mL/min/1.45m2 Final   GFR, Est Non African American  Date Value Ref Range Status  01/13/2019 52 (L) > OR = 60 mL/min/1.7m2 Final   GFR, Estimated  Date Value Ref Range Status  03/06/2023 >60 >60 mL/min Final    Comment:    (NOTE) Calculated using the CKD-EPI Creatinine Equation (2021)    GFR  Date Value Ref Range Status  07/05/2014 82.53 >60.00 mL/min Final   eGFR  Date Value Ref Range Status  08/21/2022 74 > OR = 60 mL/min/1.77m2 Final         Passed - Patient is not pregnant      Passed - Last BP in normal range    BP Readings from Last 1 Encounters:  07/26/23 136/84         Passed - Valid encounter within last 6 months    Recent Outpatient Visits           2 weeks ago Swelling of right lower extremity   [facility] Terri Mail, Terri Werner       Future Appointments             In 2 days Terri Mail, Terri Werner [facility], [facility]   In 5 months Terri Werner, Terri Werner [facility] [place]             rosuvastatin (CRESTOR) 10 MG tablet [Pharmacy Med Name: ROSUVASTATIN CALCIUM 10 MG TAB] 90 tablet 1    Sig: TAKE 1 TABLET BY MOUTH EVERY DAY     Cardiovascular:  Antilipid - Statins 2 Failed - 08/13/2023  9:34 AM      Failed - Lipid Panel in normal range within the last 12 months    Cholesterol  Date Value Ref Range Status  08/21/2022 168 <200 mg/dL Final   LDL Cholesterol (Calc)  Date Value Ref Range Status  08/21/2022 106 (H) mg/dL (calc) Final    Comment:    Reference range: <100 . Desirable range <100 mg/dL for primary prevention;   <  70 mg/dL for patients with CHD or diabetic patients  with > or = 2 CHD risk factors. Marland Kitchen LDL-C is now calculated using the Martin-Hopkins  calculation, which is a validated novel method providing  better accuracy than the Friedewald equation in the  estimation of LDL-C.  Horald Pollen et al. Lenox Ahr. 1610;960(45): 2061-2068  (http://education.[facility].com/faq/FAQ164)    HDL  Date Value Ref Range Status  08/21/2022 48 (L) > OR = 50 mg/dL Final   Triglycerides  Date Value Ref Range Status  08/21/2022 55 <150 mg/dL Final         Passed - Cr in normal range and within 360 days    Creat  Date Value Ref Range Status  08/21/2022 0.85 0.60 - 1.00 mg/dL Final   Creatinine, Ser  Date Value Ref Range Status  03/06/2023 0.79 0.44 - 1.00 mg/dL Final   Creatinine,U  Date Value Ref Range Status  04/15/2014 88.2 mg/dL Final         Passed - Patient is not pregnant      Passed - Valid encounter within last 12 months    Recent Outpatient Visits           2 weeks ago Swelling of right lower extremity   [facility] Mail, Terri Werner       Future  Appointments             In 2 days Terri Mail, Terri Werner [place] [facility]   In 5 months Terri Werner, Terri Werner [facility] [place]             cyclobenzaprine (FLEXERIL) 5 MG tablet [Pharmacy Med Name: CYCLOBENZAPRINE 5 MG TABLET] 90 tablet 1    Sig: TAKE 1 TABLET BY MOUTH AT BEDTIME AS NEEDED FOR MUSCLE SPASMS.     Not Delegated - Analgesics:  Muscle Relaxants Failed - 08/13/2023  9:34 AM      Failed - This refill cannot be delegated      Passed - Valid encounter within last 6 months    Recent Outpatient Visits           2 weeks ago Swelling of right lower extremity   [facility] Terri Mail, Terri Werner       Future Appointments             In 2 days Terri Mail, Terri Werner [facility], [facility]   In 5 months Terri Werner, [facility] [place]

## 2023-08-15 ENCOUNTER — Other Ambulatory Visit: Payer: Self-pay

## 2023-08-15 ENCOUNTER — Ambulatory Visit (INDEPENDENT_AMBULATORY_CARE_PROVIDER_SITE_OTHER): Payer: Self-pay | Admitting: Internal Medicine

## 2023-08-15 ENCOUNTER — Encounter: Payer: Self-pay | Admitting: Internal Medicine

## 2023-08-15 VITALS — BP 130/84 | HR 88 | Temp 98.4°F | Resp 16 | Ht 62.0 in | Wt 217.0 lb

## 2023-08-15 DIAGNOSIS — E7800 Pure hypercholesterolemia, unspecified: Secondary | ICD-10-CM

## 2023-08-15 DIAGNOSIS — M62838 Other muscle spasm: Secondary | ICD-10-CM | POA: Insufficient documentation

## 2023-08-15 DIAGNOSIS — I10 Essential (primary) hypertension: Secondary | ICD-10-CM

## 2023-08-15 DIAGNOSIS — R5383 Other fatigue: Secondary | ICD-10-CM

## 2023-08-15 DIAGNOSIS — R7303 Prediabetes: Secondary | ICD-10-CM

## 2023-08-15 DIAGNOSIS — Z8673 Personal history of transient ischemic attack (TIA), and cerebral infarction without residual deficits: Secondary | ICD-10-CM | POA: Diagnosis not present

## 2023-08-15 DIAGNOSIS — M7989 Other specified soft tissue disorders: Secondary | ICD-10-CM | POA: Insufficient documentation

## 2023-08-15 DIAGNOSIS — Z1231 Encounter for screening mammogram for malignant neoplasm of breast: Secondary | ICD-10-CM

## 2023-08-15 DIAGNOSIS — E559 Vitamin D deficiency, unspecified: Secondary | ICD-10-CM | POA: Diagnosis not present

## 2023-08-15 MED ORDER — AMLODIPINE BESYLATE 5 MG PO TABS: 5.0000 mg | ORAL_TABLET | Freq: Every day | ORAL | 1 refills | Status: DC

## 2023-08-15 MED ORDER — FUROSEMIDE 20 MG PO TABS
20.0000 mg | ORAL_TABLET | Freq: Every day | ORAL | 0 refills | Status: DC | PRN
Start: 2023-08-15 — End: 2023-09-09

## 2023-08-15 MED ORDER — CYCLOBENZAPRINE HCL 5 MG PO TABS: 5.0000 mg | ORAL_TABLET | Freq: Every evening | ORAL | 1 refills | Status: DC | PRN

## 2023-08-15 MED ORDER — VALSARTAN-HYDROCHLOROTHIAZIDE 80-12.5 MG PO TABS: 1.0000 | ORAL_TABLET | Freq: Every day | ORAL | 1 refills | Status: DC

## 2023-08-15 NOTE — Assessment & Plan Note (Signed)
 Recheck A1c

## 2023-08-15 NOTE — Assessment & Plan Note (Signed)
 Stable, refill muscle relaxer.

## 2023-08-15 NOTE — Assessment & Plan Note (Signed)
 Recheck.

## 2023-08-15 NOTE — Assessment & Plan Note (Signed)
 Blood pressure stable here today, no changes made to medications and appropriate refills sent to pharmacy. Labs due.

## 2023-08-15 NOTE — Assessment & Plan Note (Signed)
 Recheck lipid panel today, continue statin. Blood pressure controlled.

## 2023-08-15 NOTE — Progress Notes (Signed)
 Established Patient Office Visit  Subjective   Patient ID: Terri Werner, female    DOB: January 16, 1951  Age: 73 y.o. MRN: [id number]  Chief Complaint  Patient presents with   Medical Management of Chronic Issues    6 month recheck    HPI  Patient is here for follow up on chronic medical conditions. She is still having some pitting edema in her leg but has been taking the Lasix which is helping. No pain. [place] negative that was ordered in March.  Hypertension: -Medications: Amlodipine 5 mg, Valsartan-HCTZ 80-12.5 mg daily, Lasix 20 mg PRN  -Patient is compliant with above medications and reports no side effects. -Checking BP at home (average): 120-130/70-80 -Denies any SOB, CP, vision changes or symptoms of hypotension.   HLD/HX of CVA: -Medications: Crestor 10 mg (taking now for 1 year), Flexeril 5 mg PRN. Does still have muscle spasms in her foot at night but the Flexeril helps.  -Patient finished with PT and OT outpatient. Ambulating well with cane. Doing home exercises. Receiving botox injections in her right lower extremity which have been helping somewhat. Still having right footdrop which is affecting her balance and ambulation  -Last lipid panel: Lipid Panel     Component Value Date/Time   CHOL 168 08/21/2022 0908   TRIG 55 08/21/2022 0908   HDL 48 (L) 08/21/2022 0908   CHOLHDL 3.5 08/21/2022 0908   VLDL 12 06/01/2021 0510   LDLCALC 106 (H) 08/21/2022 0908   Pre-Diabetes: -Last A1c 5.9% 10/24 -Not currently on medications   Health Maintenance: -Blood work due -Mammogram 7/24 Birads-1  -Colon cancer screening: Colonoscopy 1/23 -Vaccinations up to date   Past Medical History:  Diagnosis Date   Arthritis    pt reports in back & knees   Colon polyps 2010   At [facility]   Diverticulitis 2016   Genital warts    GERD (gastroesophageal reflux disease)    Heart murmur    Hepatitis B    History of blood transfusion    during each major surgery per pt   History  of chicken pox    History of hiatal hernia    History of torn meniscus of right knee    HTN (hypertension)    Hx of migraine headaches    Hypertension    Hypopotassemia    Pre-diabetes    Stroke (HCC) 05/27/2021   Past Surgical History:  Procedure Laterality Date   ABDOMINAL HYSTERECTOMY  2006   APPENDECTOMY  1980   CESAREAN SECTION  1981   COLONOSCOPY WITH PROPOFOL N/A 11/01/2014   Procedure: COLONOSCOPY WITH PROPOFOL;  Surgeon: Scot Jun, MD;  Location[facility] ENDOSCOPY;  Service: Endoscopy;  Laterality: N/A;   COLONOSCOPY WITH PROPOFOL N/A 01/05/2020   Procedure: COLONOSCOPY WITH PROPOFOL;  Surgeon: Regis Bill, MD;  Location: [facility] ENDOSCOPY;  Service: Endoscopy;  Laterality: N/A;   CYSTOSCOPY WITH BIOPSY N/A 02/19/2022   Procedure: CYSTOSCOPY WITH BLADDER BIOPSY;  Surgeon: Vanna Scotland, MD;  Location: [facility] ORS;  Service: Urology;  Laterality: N/A;   ECTOPIC PREGNANCY SURGERY  1980   HERNIA REPAIR  2006   Umbilical   INCISIONAL HERNIA REPAIR N/A 03/01/2023   Procedure: PRIMARY REPAIR OF INCISIONAL HERNIA;  Surgeon: Sung Amabile, DO;  Location: [facility] ORS;  Service: General;  Laterality: N/A;   KNEE ARTHROSCOPY Left 2006   torn meniscus   LAPAROTOMY N/A 03/01/2023   Procedure: EXPLORATORY LAPAROTOMY;  Surgeon: Sung Amabile, DO;  Location: [facility] ORS;  Service: General;  Laterality: N/A;   LYSIS OF ADHESION N/A 03/01/2023   Procedure: LYSIS OF ADHESIONS;  Surgeon: Sung Amabile, DO;  Location: [facility] ORS;  Service: General;  Laterality: N/A;   TONSILLECTOMY  1971   TOTAL ABDOMINAL HYSTERECTOMY W/ BILATERAL SALPINGOOPHORECTOMY  2006   VESICOVAGINAL FISTULA CLOSURE W/ TAH     Social History   Tobacco Use   Smoking status: Never   Smokeless tobacco: Never  Vaping Use   Vaping status: Never Used  Substance Use Topics   Alcohol use: Not Currently    Comment: rare   Drug use: No   Social History   Socioeconomic History   Marital status: Widowed    Spouse name: Not  on file   Number of children: 2   Years of education: Not on file   Highest education level: Master's degree (e.g., MA, MS, MEng, MEd, MSW, MBA)  Occupational History   Occupation: [employer]    Comment: Full Time    Employer: [employer]  Tobacco Use   Smoking status: Never   Smokeless tobacco: Never  Vaping Use   Vaping status: Never Used  Substance and Sexual Activity   Alcohol use: Not Currently    Comment: rare   Drug use: No   Sexual activity: Not Currently    Partners: Male  Other Topics Concern   Not on file  Social History Narrative   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      Regular Exercise -  NO   Daily Caffeine Use:  1 soda/tea          Social Drivers of Health   Financial Resource Strain: Medium Risk (07/25/2023)   Overall Financial Resource Strain (CARDIA)    Difficulty of Paying Living Expenses: Somewhat hard  Food Insecurity: No Food Insecurity (07/25/2023)   Hunger Vital Sign    Worried About Running Out of Food in the Last Year: Never true    Ran Out of Food in the Last Year: Never true  Recent Concern: Food Insecurity - Food Insecurity Present (06/02/2023)   Hunger Vital Sign    Worried About Running Out of Food in the Last Year: Sometimes true    Ran Out of Food in the Last Year: Sometimes true  Transportation Needs: No Transportation Needs (07/25/2023)   PRAPARE - Administrator, Civil Service (Medical): No    Lack of Transportation (Non-Medical): No  Physical Activity: Sufficiently Active (07/25/2023)   Exercise Vital Sign    Days of Exercise per Week: 3 days    Minutes of Exercise per Session: 90 min  Stress: No Stress Concern Present (07/25/2023)   [employer] of Occupational Health - Occupational Stress Questionnaire    Feeling of Stress : Not at all  Social Connections: Moderately  Integrated (07/25/2023)   Social Connection and Isolation Panel [NHANES]    Frequency of Communication with Friends and Family: More than three times a week    Frequency of Social Gatherings with Friends and Family: More than three times a week    Attends Religious Services: More than 4 times per year    Active Member of [employer] or Organizations: Yes    Attends Banker Meetings: More than 4 times per year    Marital Status: Widowed  Intimate Partner Violence: Not At Risk (07/25/2023)   Humiliation, Afraid, Rape, and Kick questionnaire  Fear of Current or Ex-Partner: No    Emotionally Abused: No    Physically Abused: No    Sexually Abused: No   Family Status  Relation Name Status   Sister  Deceased   Mother  Deceased   MGF  Deceased   MGM  Deceased   Father  Alive, age 91y   PGM  Deceased   PGF  Deceased   Sister Clinical cytogeneticist   Sister  Alive   Daughter Psychiatric nurse Alive   Daughter IT trainer  No partnership data on file   Family History  Problem Relation Age of Onset   Breast cancer Sister 55   Hypertension Mother    Alzheimer's disease Mother    Dementia Mother    Hypertension Maternal Grandfather    Hyperlipidemia Maternal Grandfather    Birth defects Maternal Grandmother        Bladder   Colon cancer Maternal Grandmother    Congestive Heart Failure Father    Hyperlipidemia Father    Dementia Paternal Grandmother    Dementia Paternal Grandfather    Thyroid disease Sister    Allergies  Allergen Reactions   Latex Itching, Rash and Anaphylaxis    Burning   Shellfish-Derived Products Anaphylaxis   Shrimp [Shellfish Allergy] Swelling    Lips will burn    Lisinopril Other (See Comments)    cough  Other Reaction(s): Other (See Comments)  cough    cough cough      Review of Systems  All other systems reviewed and are negative.     Objective:     BP 130/84 (Cuff Size: Normal)   Pulse 88   Temp 98.4 F (36.9 C) (Oral)   Resp 16   Ht 5\' 2"   (1.575 m)   Wt 217 lb (98.4 kg)   SpO2 98%   BMI 39.69 kg/m  BP Readings from Last 3 Encounters:  08/15/23 130/84  07/26/23 136/84  07/25/23 128/82   Wt Readings from Last 3 Encounters:  08/15/23 217 lb (98.4 kg)  07/26/23 217 lb 6.4 oz (98.6 kg)  07/25/23 215 lb 11.2 oz (97.8 kg)      Physical Exam Constitutional:      Appearance: Normal appearance.  HENT:     Head: Normocephalic and atraumatic.     Mouth/Throat:     Mouth: Mucous membranes are moist.     Pharynx: Oropharynx is clear.  Eyes:     Extraocular Movements: Extraocular movements intact.     Conjunctiva/sclera: Conjunctivae normal.     Pupils: Pupils are equal, round, and reactive to light.  Neck:     Comments: No thyromegaly  Cardiovascular:     Rate and Rhythm: Normal rate and regular rhythm.  Pulmonary:     Effort: Pulmonary effort is normal.     Breath sounds: Normal breath sounds.  Musculoskeletal:     Cervical back: No tenderness.     Right lower leg: Edema present.     Left lower leg: Edema present.     Comments: 1+ BLE pitting edema  Lymphadenopathy:     Cervical: No cervical adenopathy.  Skin:    General: Skin is warm and dry.  Neurological:     General: No focal deficit present.     Mental Status: She is alert. Mental status is at baseline.  Psychiatric:        Mood and Affect: Mood normal.        Behavior: Behavior normal.      No results  found for any visits on 08/15/23.  Last CBC Lab Results  Component Value Date   WBC 4.9 03/04/2023   HGB 10.8 (L) 03/04/2023   HCT 32.5 (L) 03/04/2023   MCV 87.4 03/04/2023   MCH 29.0 03/04/2023   RDW 14.7 03/04/2023   PLT 159 03/04/2023   Last metabolic panel Lab Results  Component Value Date   GLUCOSE 110 (H) 03/06/2023   NA 138 03/06/2023   K 4.0 03/06/2023   CL 103 03/06/2023   CO2 27 03/06/2023   BUN 11 03/06/2023   CREATININE 0.79 03/06/2023   GFRNONAA >60 03/06/2023   CALCIUM 8.8 (L) 03/06/2023   PROT 8.5 (H) 02/28/2023    ALBUMIN 4.4 02/28/2023   BILITOT 0.8 02/28/2023   ALKPHOS 50 02/28/2023   AST 22 02/28/2023   ALT 17 02/28/2023   ANIONGAP 8 03/06/2023   Last lipids Lab Results  Component Value Date   CHOL 168 08/21/2022   HDL 48 (L) 08/21/2022   LDLCALC 106 (H) 08/21/2022   TRIG 55 08/21/2022   CHOLHDL 3.5 08/21/2022   Last hemoglobin A1c Lab Results  Component Value Date   HGBA1C 5.9 (A) 02/14/2023   Last thyroid functions Lab Results  Component Value Date   TSH 1.80 02/27/2021   Last vitamin D Lab Results  Component Value Date   VD25OH 36 08/21/2022   Last vitamin B12 and Folate No results found for: "VITAMINB12", "FOLATE"    The ASCVD Risk score (Arnett DK, et al., 2019) failed to calculate for the following reasons:   Risk score cannot be calculated because patient has a medical history suggesting prior/existing ASCVD    Assessment & Plan:   Hypertension, benign Assessment & Plan: Blood pressure stable here today, no changes made to medications and appropriate refills sent to pharmacy. Labs due.  Orders: -     COMPLETE METABOLIC PANEL WITHOUT GFR -     CBC with Differential/Platelet -     amLODIPine Besylate; Take 1 tablet (5 mg total) by mouth daily.  Dispense: 90 tablet; Refill: 1 -     Valsartan-hydroCHLOROthiazide; Take 1 tablet by mouth daily.  Dispense: 90 tablet; Refill: 1  Swelling of lower extremity Assessment & Plan: 1+ pitting edema BLE, refill Lasix 20 mg and recheck labs.   Orders: -     Furosemide; Take 1 tablet (20 mg total) by mouth daily as needed for edema or fluid.  Dispense: 30 tablet; Refill: 0  Pure hypercholesterolemia Assessment & Plan: Recheck lipid panel, continue statin.  Orders: -     Lipid panel  History of CVA (cerebrovascular accident) Assessment & Plan: Recheck lipid panel today, continue statin. Blood pressure controlled.   Orders: -     Lipid panel  Muscle spasms of both lower extremities Assessment & Plan: Stable,  refill muscle relaxer.   Orders: -     Cyclobenzaprine HCl; Take 1 tablet (5 mg total) by mouth at bedtime as needed for muscle spasms.  Dispense: 90 tablet; Refill: 1  Pre-diabetes Assessment & Plan: Recheck A1c.   Orders: -     Hemoglobin A1c  Other fatigue -     TSH -     Vitamin B12  Vitamin D deficiency Assessment & Plan: Recheck.   Orders: -     VITAMIN D 25 Hydroxy (Vit-D Deficiency, Fractures)  Encounter for screening mammogram for malignant neoplasm of breast -     3D Screening Mammogram, Left and Right; Future     Return in about  6 months (around 02/14/2024).    Margarita Mail, DO

## 2023-08-15 NOTE — Assessment & Plan Note (Signed)
 Recheck lipid panel, continue statin

## 2023-08-15 NOTE — Assessment & Plan Note (Signed)
 1+ pitting edema BLE, refill Lasix 20 mg and recheck labs.

## 2023-08-16 LAB — COMPLETE METABOLIC PANEL WITHOUT GFR
AG Ratio: 1.4 (calc) (ref 1.0–2.5)
ALT: 10 U/L (ref 6–29)
AST: 16 U/L (ref 10–35)
Albumin: 4.2 g/dL (ref 3.6–5.1)
Alkaline phosphatase (APISO): 43 U/L (ref 37–153)
BUN: 15 mg/dL (ref 7–25)
CO2: 31 mmol/L (ref 20–32)
Calcium: 9.9 mg/dL (ref 8.6–10.4)
Chloride: 103 mmol/L (ref 98–110)
Creat: 0.91 mg/dL (ref 0.60–1.00)
Globulin: 3 g/dL (ref 1.9–3.7)
Glucose, Bld: 90 mg/dL (ref 65–99)
Potassium: 4.1 mmol/L (ref 3.5–5.3)
Sodium: 143 mmol/L (ref 135–146)
Total Bilirubin: 0.4 mg/dL (ref 0.2–1.2)
Total Protein: 7.2 g/dL (ref 6.1–8.1)

## 2023-08-16 LAB — CBC WITH DIFFERENTIAL/PLATELET
Absolute Lymphocytes: 1786 {cells}/uL (ref 850–3900)
Absolute Monocytes: 626 {cells}/uL (ref 200–950)
Basophils Absolute: 17 {cells}/uL (ref 0–200)
Basophils Relative: 0.3 %
Eosinophils Absolute: 110 {cells}/uL (ref 15–500)
Eosinophils Relative: 1.9 %
HCT: 35.6 % (ref 35.0–45.0)
Hemoglobin: 11.5 g/dL — ABNORMAL LOW (ref 11.7–15.5)
MCH: 28 pg (ref 27.0–33.0)
MCHC: 32.3 g/dL (ref 32.0–36.0)
MCV: 86.6 fL (ref 80.0–100.0)
MPV: 11.4 fL (ref 7.5–12.5)
Monocytes Relative: 10.8 %
Neutro Abs: 3260 {cells}/uL (ref 1500–7800)
Neutrophils Relative %: 56.2 %
Platelets: 205 10*3/uL (ref 140–400)
RBC: 4.11 10*6/uL (ref 3.80–5.10)
RDW: 14.2 % (ref 11.0–15.0)
Total Lymphocyte: 30.8 %
WBC: 5.8 10*3/uL (ref 3.8–10.8)

## 2023-08-16 LAB — HEMOGLOBIN A1C
Hgb A1c MFr Bld: 6.5 %{Hb} — ABNORMAL HIGH (ref <5.7)
Mean Plasma Glucose: 140 mg/dL
eAG (mmol/L): 7.7 mmol/L

## 2023-08-16 LAB — LIPID PANEL
Cholesterol: 146 mg/dL (ref <200)
HDL: 62 mg/dL (ref >=50)
LDL Cholesterol (Calc): 69 mg/dL
Non-HDL Cholesterol (Calc): 84 mg/dL (ref <130)
Total CHOL/HDL Ratio: 2.4 (calc) (ref <5.0)
Triglycerides: 72 mg/dL (ref <150)

## 2023-08-16 LAB — TSH: TSH: 1.17 m[IU]/L (ref 0.40–4.50)

## 2023-08-16 LAB — VITAMIN D 25 HYDROXY (VIT D DEFICIENCY, FRACTURES): Vit D, 25-Hydroxy: 39 ng/mL (ref 30–100)

## 2023-08-19 ENCOUNTER — Encounter: Payer: Self-pay | Admitting: Internal Medicine

## 2023-08-19 ENCOUNTER — Other Ambulatory Visit: Payer: Self-pay | Admitting: Internal Medicine

## 2023-08-19 DIAGNOSIS — R7303 Prediabetes: Secondary | ICD-10-CM

## 2023-08-19 MED ORDER — METFORMIN HCL 500 MG PO TABS: 500.0000 mg | ORAL_TABLET | Freq: Every day | ORAL | 1 refills | Status: DC

## 2023-08-20 DIAGNOSIS — R5383 Other fatigue: Secondary | ICD-10-CM | POA: Diagnosis not present

## 2023-08-20 LAB — VITAMIN B12: Vitamin B-12: 1097 pg/mL (ref 200–1100)

## 2023-08-21 ENCOUNTER — Encounter: Payer: Self-pay | Admitting: Internal Medicine

## 2023-08-22 ENCOUNTER — Other Ambulatory Visit: Payer: Self-pay | Admitting: Internal Medicine

## 2023-08-22 DIAGNOSIS — E1165 Type 2 diabetes mellitus with hyperglycemia: Secondary | ICD-10-CM

## 2023-08-22 MED ORDER — LANCET DEVICE MISC: 1.0000 | Freq: Three times a day (TID) | 0 refills | Status: AC

## 2023-08-22 MED ORDER — LANCETS MISC. MISC: 1.0000 | Freq: Three times a day (TID) | 0 refills | Status: AC

## 2023-08-22 MED ORDER — BLOOD GLUCOSE MONITORING SUPPL DEVI: 1.0000 | Freq: Three times a day (TID) | 0 refills | Status: DC

## 2023-08-22 MED ORDER — BLOOD GLUCOSE TEST VI STRP: 1.0000 | ORAL_STRIP | Freq: Three times a day (TID) | 0 refills | Status: DC

## 2023-08-27 ENCOUNTER — Ambulatory Visit
Admission: RE | Admit: 2023-08-27 | Discharge: 2023-08-27 | Disposition: A | Discharge status: Home / Self Care | Source: Ambulatory Visit | Attending: Internal Medicine | Admitting: Internal Medicine

## 2023-08-27 DIAGNOSIS — Z780 Asymptomatic menopausal state: Secondary | ICD-10-CM | POA: Diagnosis not present

## 2023-08-27 DIAGNOSIS — I110 Hypertensive heart disease with heart failure: Secondary | ICD-10-CM | POA: Diagnosis not present

## 2023-08-28 ENCOUNTER — Encounter: Payer: Self-pay | Admitting: Internal Medicine

## 2023-09-07 ENCOUNTER — Other Ambulatory Visit: Payer: Self-pay | Admitting: Internal Medicine

## 2023-09-07 DIAGNOSIS — M7989 Other specified soft tissue disorders: Secondary | ICD-10-CM

## 2023-09-09 NOTE — Telephone Encounter (Signed)
 Requested Prescriptions  Pending Prescriptions Disp Refills   furosemide  (LASIX ) 20 MG tablet [Pharmacy Med Name: FUROSEMIDE  20 MG TABLET] 90 tablet 0    Sig: TAKE 1 TABLET (20 MG TOTAL) BY MOUTH DAILY AS NEEDED FOR EDEMA OR FLUID.     Cardiovascular:  Diuretics - Loop Failed - 09/09/2023 12:48 PM      Failed - Mg Level in normal range and within 180 days    Magnesium   Date Value Ref Range Status  08/21/2022 2.3 1.5 - 2.5 mg/dL Final         Passed - K in normal range and within 180 days    Potassium  Date Value Ref Range Status  08/15/2023 4.1 3.5 - 5.3 mmol/L Final  07/03/2012 3.7 3.5 - 5.1 mmol/L Final         Passed - Ca in normal range and within 180 days    Calcium   Date Value Ref Range Status  08/15/2023 9.9 8.6 - 10.4 mg/dL Final   Calcium , Total  Date Value Ref Range Status  07/03/2012 9.0 8.5 - 10.1 mg/dL Final         Passed - Na in normal range and within 180 days    Sodium  Date Value Ref Range Status  08/15/2023 143 135 - 146 mmol/L Final  07/03/2012 141 136 - 145 mmol/L Final         Passed - Cr in normal range and within 180 days    Creat  Date Value Ref Range Status  08/15/2023 0.91 0.60 - 1.00 mg/dL Final   Creatinine,U  Date Value Ref Range Status  04/15/2014 88.2 mg/dL Final         Passed - Cl in normal range and within 180 days    Chloride  Date Value Ref Range Status  08/15/2023 103 98 - 110 mmol/L Final  07/03/2012 109 (H) 98 - 107 mmol/L Final         Passed - Last BP in normal range    BP Readings from Last 1 Encounters:  08/15/23 130/84         Passed - Valid encounter within last 6 months    Recent Outpatient Visits           3 weeks ago Hypertension, benign   [facility] Rockney Cid, DO   1 month ago Swelling of right lower extremity   [facility] Rockney Cid, DO       Future Appointments             In 4 months [place] [facility]

## 2023-10-03 ENCOUNTER — Other Ambulatory Visit: Payer: Self-pay | Admitting: Internal Medicine

## 2023-10-03 DIAGNOSIS — E1165 Type 2 diabetes mellitus with hyperglycemia: Secondary | ICD-10-CM

## 2023-10-04 NOTE — Telephone Encounter (Signed)
 Requested medication (s) are due for refill today: routing for review  Requested medication (s) are on the active medication list: no  Last refill:  08/22/23  Future visit scheduled: no  Notes to clinic:  Unable to refill per protocol, Rx expired.      Requested Prescriptions  Pending Prescriptions Disp Refills   ONETOUCH VERIO test strip [Pharmacy Med Name: ONE TOUCH VERIO TEST STRIP] 100 strip 0    Sig: 1 EACH BY IN VITRO ROUTE IN THE MORNING, AT NOON, AND AT BEDTIME.     Endocrinology: Diabetes - Testing Supplies Passed - 10/04/2023  3:36 PM      Passed - Valid encounter within last 12 months    Recent Outpatient Visits           1 month ago Hypertension, benign   [facility] Rockney Cid, DO   2 months ago Swelling of right lower extremity   [facility] Rockney Cid, DO       Future Appointments             In 3 months Vaillancourt, Samantha, PA-C [facility] [place]

## 2023-10-08 ENCOUNTER — Other Ambulatory Visit: Payer: Self-pay | Admitting: Internal Medicine

## 2023-10-08 DIAGNOSIS — E1165 Type 2 diabetes mellitus with hyperglycemia: Secondary | ICD-10-CM

## 2023-10-10 NOTE — Telephone Encounter (Signed)
 Requested Prescriptions  Pending Prescriptions Disp Refills   Blood Glucose Monitoring Suppl (ONETOUCH VERIO FLEX SYSTEM) w/Device KIT [Pharmacy Med Name: ONE TOUCH VERIO FLEX SYST KIT]      Sig: 1 EACH BY DOES NOT APPLY ROUTE IN THE MORNING, AT NOON, AND AT BEDTIME.     Endocrinology: Diabetes - Testing Supplies Passed - 10/10/2023  4:57 PM      Passed - Valid encounter within last 12 months    Recent Outpatient Visits           1 month ago Hypertension, benign   [facility] Rockney Cid, DO   2 months ago Swelling of right lower extremity   [facility] Rockney Cid, DO       Future Appointments             In 3 months Vaillancourt, Samantha, PA-C [facility] [place]

## 2023-10-14 ENCOUNTER — Other Ambulatory Visit: Payer: Self-pay | Admitting: Internal Medicine

## 2023-10-14 DIAGNOSIS — E1165 Type 2 diabetes mellitus with hyperglycemia: Secondary | ICD-10-CM

## 2023-10-15 NOTE — Telephone Encounter (Signed)
 Requested medications are due for refill today.  unsure  Requested medications are on the active medications list.  no  Last refill. never  Future visit scheduled.   yes  Notes to clinic.  Please review for refill.    Requested Prescriptions  Pending Prescriptions Disp Refills   Lancets (ONETOUCH DELICA PLUS LANCET33G) MISC [Pharmacy Med Name: ONE TOUCH DELICA PLUS 33G LANC] 100 each     Sig: 1 EACH BY DOES NOT APPLY ROUTE IN THE MORNING, AT NOON, AND AT BEDTIME.     Endocrinology: Diabetes - Testing Supplies Passed - 10/15/2023  1:22 PM      Passed - Valid encounter within last 12 months    Recent Outpatient Visits           2 months ago Hypertension, benign   [facility] Rockney Cid, DO   2 months ago Swelling of right lower extremity   [facility] Rockney Cid, DO       Future Appointments             In 3 months Vaillancourt, Samantha, PA-C [facility] [place]

## 2023-10-29 ENCOUNTER — Telehealth: Admitting: Physician Assistant

## 2023-10-29 ENCOUNTER — Ambulatory Visit: Payer: Self-pay

## 2023-10-29 DIAGNOSIS — L304 Erythema intertrigo: Secondary | ICD-10-CM | POA: Diagnosis not present

## 2023-10-29 DIAGNOSIS — I872 Venous insufficiency (chronic) (peripheral): Secondary | ICD-10-CM

## 2023-10-29 DIAGNOSIS — D239 Other benign neoplasm of skin, unspecified: Secondary | ICD-10-CM | POA: Diagnosis not present

## 2023-10-29 MED ORDER — NYSTATIN 100000 UNIT/GM EX CREA
1.0000 | TOPICAL_CREAM | Freq: Two times a day (BID) | CUTANEOUS | 0 refills | Status: DC
Start: 2023-10-29 — End: 2024-01-16

## 2023-10-29 MED ORDER — PREDNISONE 10 MG (21) PO TBPK: ORAL_TABLET | ORAL | 0 refills | Status: DC

## 2023-10-29 MED ORDER — TRIAMCINOLONE ACETONIDE 0.1 % EX CREA: 1.0000 | TOPICAL_CREAM | Freq: Two times a day (BID) | CUTANEOUS | 0 refills | Status: DC

## 2023-10-29 NOTE — Progress Notes (Signed)
 Virtual Visit Consent   Terri Werner, you are scheduled for a virtual visit with a [facility] provider today. Just as with appointments in the office, your consent must be obtained to participate. Your consent will be active for this visit and any virtual visit you may have with one of our providers in the next 365 days. If you have a MyChart account, a copy of this consent can be sent to you electronically.  As this is a virtual visit, video technology does not allow for your provider to perform a traditional examination. This may limit your provider's ability to fully assess your condition. If your provider identifies any concerns that need to be evaluated in person or the need to arrange testing (such as labs, EKG, etc.), we will make arrangements to do so. Although advances in technology are sophisticated, we cannot ensure that it will always work on either your end or our end. If the connection with a video visit is poor, the visit may have to be switched to a telephone visit. With either a video or telephone visit, we are not always able to ensure that we have a secure connection.  By engaging in this virtual visit, you consent to the provision of healthcare and authorize for your insurance to be billed (if applicable) for the services provided during this visit. Depending on your insurance coverage, you may receive a charge related to this service.  I need to obtain your verbal consent now. Are you willing to proceed with your visit today? Terri Werner has provided verbal consent on 10/29/2023 for a virtual visit (video or telephone). Angelia Kelp, PA-C  Date: 10/29/2023 12:19 PM   Virtual Visit via Video Note   I, Angelia Kelp, connected with  Terri Werner  ([id number], Jun 09, 1950) on 10/29/23 at 11:00 AM EDT by a video-enabled telemedicine application and verified that I am speaking with the correct person using two  identifiers.  Location: Patient: Virtual Visit Location Patient: Home Provider: Virtual Visit Location Provider: Home Office   I discussed the limitations of evaluation and management by telemedicine and the availability of in person appointments. The patient expressed understanding and agreed to proceed.    History of Present Illness: Terri Werner is a 73 y.o. who identifies as a female who was assigned female at birth, and is being seen today for multiple complaints.  HPI: Rash This is a new problem. The problem is unchanged. The rash is diffuse. The rash is characterized by burning and itchiness. She was exposed to nothing.    Venous Insufficiency: bilateral, having red discoloration of lower legs. Occurs from the ankles to middle way below knees. Noticed in a picture from church last week.   Axilla rash: Left axilla. Started last week. Itches. Is a darkening and dryness appearance of the skin.  Problems:  Patient Active Problem List   Diagnosis Date Noted   Swelling of lower extremity 08/15/2023   Muscle spasms of both lower extremities 08/15/2023   Vitamin D  deficiency 08/15/2023   History of CVA (cerebrovascular accident) 06/04/2023   SBO (small bowel obstruction) (HCC) 03/01/2023   Partial bowel obstruction (HCC) 02/28/2023   Hx of spontaneous intraparenchymal intracranial hemorrhage 07/12/2021   Intraparenchymal hemorrhage of brain (HCC) 05/31/2021   ICH (intracerebral hemorrhage) (HCC) 05/27/2021   Morbid obesity (HCC) 09/24/2018   Unspecified inflammatory spondylopathy, lumbar region (HCC) 03/05/2018   Lumbar spondylolysis 08/20/2017   Lumbar facet arthropathy 08/20/2017   Lumbar degenerative disc disease  08/20/2017   Chronic constipation 07/30/2017   Primary osteoarthritis involving multiple joints 04/17/2017   Postmenopausal 10/12/2016   Pre-diabetes 10/12/2016   Lumbago 08/04/2015   Diverticulitis of colon 07/05/2014   Carotid artery disorder (HCC)  04/27/2014   Hyperlipidemia 10/08/2013   Hypertension, benign 11/19/2009    Allergies:  Allergies  Allergen Reactions   Latex Itching, Rash and Anaphylaxis    Burning   Shellfish-Derived Products Anaphylaxis   Shrimp [Shellfish Allergy] Swelling    Lips will burn    Lisinopril Other (See Comments)    cough  Other Reaction(s): Other (See Comments)  cough    cough cough   Medications:  Current Outpatient Medications:    nystatin cream (MYCOSTATIN), Apply 1 Application topically 2 (two) times daily., Disp: 30 g, Rfl: 0   predniSONE (STERAPRED UNI-PAK 21 TAB) 10 MG (21) TBPK tablet, 6 day taper; take as directed on package instructions, Disp: 21 tablet, Rfl: 0   triamcinolone  cream (KENALOG ) 0.1 %, Apply 1 Application topically 2 (two) times daily., Disp: 30 g, Rfl: 0   acetaminophen  (TYLENOL ) 325 MG tablet, Take 2 tablets (650 mg total) by mouth every 4 (four) hours as needed for mild pain (or temp > 37.5 C (99.5 F))., Disp: , Rfl:    amLODipine  (NORVASC ) 5 MG tablet, Take 1 tablet (5 mg total) by mouth daily., Disp: 90 tablet, Rfl: 1   bisacodyl (DULCOLAX) 5 MG EC tablet, Take 5 mg by mouth daily as needed for moderate constipation., Disp: , Rfl:    Blood Glucose Monitoring Suppl (ONETOUCH VERIO FLEX SYSTEM) w/Device KIT, 1 EACH BY DOES NOT APPLY ROUTE IN THE MORNING, AT NOON, AND AT BEDTIME., Disp: 1 kit, Rfl: 0   cyclobenzaprine  (FLEXERIL ) 5 MG tablet, Take 1 tablet (5 mg total) by mouth at bedtime as needed for muscle spasms., Disp: 90 tablet, Rfl: 1   furosemide  (LASIX ) 20 MG tablet, TAKE 1 TABLET (20 MG TOTAL) BY MOUTH DAILY AS NEEDED FOR EDEMA OR FLUID., Disp: 90 tablet, Rfl: 0   Lancets (ONETOUCH DELICA PLUS LANCET33G) MISC, 1 EACH BY DOES NOT APPLY ROUTE IN THE MORNING, AT NOON, AND AT BEDTIME., Disp: 100 each, Rfl: 5   lidocaine  (XYLOCAINE ) 5 % ointment, Apply 1 Application topically as needed., Disp: 35.44 g, Rfl: 2   metFORMIN  (GLUCOPHAGE ) 500 MG tablet, Take 1 tablet (500  mg total) by mouth daily with breakfast., Disp: 90 tablet, Rfl: 1   ONETOUCH VERIO test strip, 1 EACH BY IN VITRO ROUTE IN THE MORNING, AT NOON, AND AT BEDTIME., Disp: 100 strip, Rfl: 0   polyethylene glycol powder (GLYCOLAX /MIRALAX ) 17 GM/SCOOP powder, Take 17 g by mouth daily as needed for moderate constipation., Disp: 3350 g, Rfl: 1   rosuvastatin  (CRESTOR ) 10 MG tablet, TAKE 1 TABLET BY MOUTH EVERY DAY, Disp: 90 tablet, Rfl: 1   sennosides-docusate sodium  (SENOKOT-S) 8.6-50 MG tablet, Take 1 tablet by mouth daily. Takes as needed, Disp: , Rfl:    valsartan -hydrochlorothiazide  (DIOVAN -HCT) 80-12.5 MG tablet, Take 1 tablet by mouth daily., Disp: 90 tablet, Rfl: 1  Observations/Objective: Patient is well-developed, well-nourished in no acute distress.  Resting comfortably at home.  Head is normocephalic, atraumatic.  No labored breathing.  Speech is clear and coherent with logical content.  Patient is alert and oriented at baseline.  Multiple well-demarcated, annular, raised nodules on the lower extremities, most are dark brown to almost black in color. One on the right ankle area is white. No surrounding redness or swelling. They are  pruritic and occasionally burn per patient Left axilla with darkened and scaly skin  Assessment and Plan: 1. Dermatofibroma (Primary) - predniSONE (STERAPRED UNI-PAK 21 TAB) 10 MG (21) TBPK tablet; 6 day taper; take as directed on package instructions  Dispense: 21 tablet; Refill: 0 - triamcinolone  cream (KENALOG ) 0.1 %; Apply 1 Application topically 2 (two) times daily.  Dispense: 30 g; Refill: 0  2. Venous stasis dermatitis of both lower extremities  3. Intertrigo - nystatin cream (MYCOSTATIN); Apply 1 Application topically 2 (two) times daily.  Dispense: 30 g; Refill: 0  - Suspect dermatofibromas as the lesions/nodules - Will give Triamcinolone  cream since they are pruritic - Oral prednisone to calm down itching she is feeling all over - Discoloration  of lower extremities suspected to be venous stasis - Advised to elevate legs when at rest, compression stockings, continue her physical activity (exercises at [employer] 3x week) - Rash in left axilla consistent with intertrigo - Nystatin prescribed - Keep skin clean and dry - Avoid picking or scratching - Seek in person evaluation if worsening, can follow up with Dermatology (has seen [facility] in the past)  Follow Up Instructions: I discussed the assessment and treatment plan with the patient. The patient was provided an opportunity to ask questions and all were answered. The patient agreed with the plan and demonstrated an understanding of the instructions.  A copy of instructions were sent to the patient via MyChart unless otherwise noted below.    The patient was advised to call back or seek an in-person evaluation if the symptoms worsen or if the condition fails to improve as anticipated.    Angelia Kelp, PA-C

## 2023-10-29 NOTE — Telephone Encounter (Signed)
 FYI Only or Action Required?: FYI only for provider  Patient was last seen in primary care on 08/15/2023 by Rockney Cid, DO. Called Nurse Triage reporting Rash. Symptoms began several weeks ago. Interventions attempted: OTC medications: hydrocortisone cream, zyrtec and tylenol  . Symptoms are: gradually worsening.  Triage Disposition: See Physician Within 24 Hours  Patient/caregiver understands and will follow disposition?: Yes     Copied from CRM [id number]. Topic: Clinical - Red Word Triage >> Oct 29, 2023  8:40 AM Alica Antu wrote: Red Word that prompted transfer to Nurse Triage: painful rash bites unsure what it could be but they are painful and now they spreading Reason for Disposition  SEVERE itching (i.e., interferes with sleep, normal activities or school)  Answer Assessment - Initial Assessment Questions 1. APPEARANCE of RASH: Describe the rash. (e.g., spots, blisters, raised areas, skin peeling, scaly)     Red small bumps 2. SIZE: How big are the spots? (e.g., tip of pen, eraser, coin; inches, centimeters)      multiple 3. LOCATION: Where is the rash located?     Arm, foot, back 4. COLOR: What color is the rash? (Note: It is difficult to assess rash color in people with darker-colored skin. When this situation occurs, simply ask the caller to describe what they see.)     red 5. ONSET: When did the rash begin?     2 weeks 6. FEVER: Do you have a fever? If Yes, ask: What is your temperature, how was it measured, and when did it start?     no 7. ITCHING: Does the rash itch? If Yes, ask: How bad is the itch? (Scale 1-10; or mild, moderate, severe)     8 8. CAUSE: What do you think is causing the rash?     unknown 9. MEDICINE FACTORS: Have you started any new medicines within the last 2 weeks? (e.g., antibiotics)      no 10. OTHER SYMPTOMS: Do you have any other symptoms? (e.g., dizziness, headache, sore throat, joint pain)       pain  Protocols  used: Rash or Redness - [facility]

## 2023-10-29 NOTE — Patient Instructions (Addendum)
 Marsia K Werner, thank you for joining Angelia Kelp, PA-C for today's virtual visit.  While this provider is not your primary care provider (PCP), if your PCP is located in our provider database this encounter information will be shared with them immediately following your visit.   A [facility] MyChart account gives you access to today's visit and all your visits, tests, and labs performed at [facility]  click here if you don't have a [facility] MyChart account or go to [email or web address]  Consent: (Patient) Terri Werner provided verbal consent for this virtual visit at the beginning of the encounter.  Current Medications:  Current Outpatient Medications:    nystatin cream (MYCOSTATIN), Apply 1 Application topically 2 (two) times daily., Disp: 30 g, Rfl: 0   predniSONE (STERAPRED UNI-PAK 21 TAB) 10 MG (21) TBPK tablet, 6 day taper; take as directed on package instructions, Disp: 21 tablet, Rfl: 0   triamcinolone  cream (KENALOG ) 0.1 %, Apply 1 Application topically 2 (two) times daily., Disp: 30 g, Rfl: 0   acetaminophen  (TYLENOL ) 325 MG tablet, Take 2 tablets (650 mg total) by mouth every 4 (four) hours as needed for mild pain (or temp > 37.5 C (99.5 F))., Disp: , Rfl:    amLODipine  (NORVASC ) 5 MG tablet, Take 1 tablet (5 mg total) by mouth daily., Disp: 90 tablet, Rfl: 1   bisacodyl (DULCOLAX) 5 MG EC tablet, Take 5 mg by mouth daily as needed for moderate constipation., Disp: , Rfl:    Blood Glucose Monitoring Suppl (ONETOUCH VERIO FLEX SYSTEM) w/Device KIT, 1 EACH BY DOES NOT APPLY ROUTE IN THE MORNING, AT NOON, AND AT BEDTIME., Disp: 1 kit, Rfl: 0   cyclobenzaprine  (FLEXERIL ) 5 MG tablet, Take 1 tablet (5 mg total) by mouth at bedtime as needed for muscle spasms., Disp: 90 tablet, Rfl: 1   furosemide  (LASIX ) 20 MG tablet, TAKE 1 TABLET (20 MG TOTAL) BY MOUTH DAILY AS NEEDED FOR EDEMA OR FLUID., Disp: 90 tablet, Rfl: 0   Lancets (ONETOUCH  DELICA PLUS LANCET33G) MISC, 1 EACH BY DOES NOT APPLY ROUTE IN THE MORNING, AT NOON, AND AT BEDTIME., Disp: 100 each, Rfl: 5   lidocaine  (XYLOCAINE ) 5 % ointment, Apply 1 Application topically as needed., Disp: 35.44 g, Rfl: 2   metFORMIN  (GLUCOPHAGE ) 500 MG tablet, Take 1 tablet (500 mg total) by mouth daily with breakfast., Disp: 90 tablet, Rfl: 1   ONETOUCH VERIO test strip, 1 EACH BY IN VITRO ROUTE IN THE MORNING, AT NOON, AND AT BEDTIME., Disp: 100 strip, Rfl: 0   polyethylene glycol powder (GLYCOLAX /MIRALAX ) 17 GM/SCOOP powder, Take 17 g by mouth daily as needed for moderate constipation., Disp: 3350 g, Rfl: 1   rosuvastatin  (CRESTOR ) 10 MG tablet, TAKE 1 TABLET BY MOUTH EVERY DAY, Disp: 90 tablet, Rfl: 1   sennosides-docusate sodium  (SENOKOT-S) 8.6-50 MG tablet, Take 1 tablet by mouth daily. Takes as needed, Disp: , Rfl:    valsartan -hydrochlorothiazide  (DIOVAN -HCT) 80-12.5 MG tablet, Take 1 tablet by mouth daily., Disp: 90 tablet, Rfl: 1   Medications ordered in this encounter:  Meds ordered this encounter  Medications   predniSONE (STERAPRED UNI-PAK 21 TAB) 10 MG (21) TBPK tablet    Sig: 6 day taper; take as directed on package instructions    Dispense:  21 tablet    Refill:  0    Supervising Provider:   LAMPTEY, PHILIP O [[id number]]   triamcinolone  cream (KENALOG ) 0.1 %    Sig: Apply 1 Application topically 2 (  two) times daily.    Dispense:  30 g    Refill:  0    Supervising Provider:   LAMPTEY, PHILIP O [[id number]]   nystatin cream (MYCOSTATIN)    Sig: Apply 1 Application topically 2 (two) times daily.    Dispense:  30 g    Refill:  0    Supervising Provider:   Corine Dice [[id number]]     *If you need refills on other medications prior to your next appointment, please contact your pharmacy*  Follow-Up: Call back or seek an in-person evaluation if the symptoms worsen or if the condition fails to improve as anticipated.  [facility] [phone]  Other  Instructions  Stasis Dermatitis Stasis dermatitis is a long-term (chronic) skin condition that happens when you have poor circulation. This is when your veins can no longer pump blood back to the heart like they should. This condition causes a red or brown scaly rash or sores (ulcers). It often affects the lower legs. It may affect one leg or both legs. Without treatment, this condition can lead to other skin problems. What are the causes? This condition is caused by poor circulation. What increases the risk? You are more likely to get this condition if: You are not very active. You have to stand for long periods. You have varicose veins. These are veins that are enlarged and twisted. You have venous insufficiency. This is when your leg veins struggle to send blood back to your heart. You have been pregnant many times. You have had vein surgery or injuries to your legs in the past. You have heart or kidney failure. You have had a blood clot. You are 28 years of age or older and are overweight. What are the signs or symptoms? Common early symptoms of this condition include: Itching in one or both of your legs. Swelling in your ankle or leg. This may get better at night but be worse during the day. Skin that looks thin on your ankle and leg. Skin that is dry, cracked, or easily irritated. Red or brown marks that form slowly. Red, swollen skin that burns or is sore. An achy or heavy feeling after you walk or stand for a long time. Pain. Later and more severe symptoms of this condition include: Skin that looks shiny. Ulcers. These are often red or purple and leak fluid. Skin that feels hard. Severe itching. A change in the shape or color of your lower legs. Severe pain. Trouble walking. How is this diagnosed? This condition may be diagnosed based on your symptoms, medical history, and a physical exam. You may also have tests. These may include: Blood tests. Doppler ultrasound. This  can check blood flow. Allergy tests. You may need to see a health care provider who is an expert in skin diseases (dermatologist). How is this treated? This condition may be treated with: Compression stockings or an elastic wrap. Medicines, such as: Corticosteroid creams and ointments. Other medicines applied to the skin. Medicines to reduce swelling in the legs (diuretics). Antibiotics. Medicines to help with itching (antihistamines). A bandage (dressing). A wrap that has zinc and gelatin on it (Unna boot). Follow these instructions at home: Medicines Take or use over-the-counter and prescription medicines only as told by your provider. If you were prescribed antibiotics, take or use them as told by your provider. Do not stop taking or using the antibiotic even if you start to feel better. Skin care Moisturize your skin as told by  your provider. Do not use scented moisturizers, soaps, detergents, or perfumes. These can irritate your skin. Apply a cool, wet cloth (cool compress) to the affected areas. Do not scratch your skin. Do not rub your skin dry after a bath or shower. Gently pat your skin dry. Check the affected areas every day for signs of infection. Check for: More redness, swelling, or pain. More fluid or blood. Warmth. Pus or a bad smell. Activity Walk as told by your provider. Walking increases blood flow. Do calf and ankle exercises during the day as told by your provider. This will help increase blood flow. Raise (elevate) your legs above the level of your heart while you are sitting or lying down. Lifestyle Work with your provider to lose weight, if needed. Do not cross your legs when you sit. Do not stand or sit in one position for a long time. Wear comfortable, loose-fitting clothes. Circulation in your legs will be worse if you wear tight pants, belts, and waistbands. Do not use any products that contain nicotine or tobacco. These products include cigarettes,  chewing tobacco, and vaping devices, such as e-cigarettes. If you need help quitting, ask your provider. General instructions Follow instructions from your provider on how to remove and change any dressings. Wear compression stockings as told by your provider. These stockings help to prevent blood clots and reduce swelling in your legs. If you were told to wear an Radio broadcast assistant, do so as told by your provider. Keep all follow-up visits. Your provider will check your skin for any changes and adjust your treatment plan as needed. Contact a health care provider if: Your condition does not get better with treatment. Your condition gets worse. You have any signs of infection. You see red streaks coming from the affected area. The affected area turns darker. You have a fever. Get help right away if: Your bone or joint under the affected area becomes painful after the skin has healed. You feel a deep pain in your leg or groin. You are short of breath. These symptoms may be an emergency. Get help right away. Call 911. Do not wait to see if the symptoms will go away. Do not drive yourself to the hospital. This information is not intended to replace advice given to you by your health care provider. Make sure you discuss any questions you have with your health care provider. Document Revised: 05/15/2022 Document Reviewed: 05/15/2022 Elsevier Patient Education  2024 Elsevier Inc.   Dermatofibroma A dermatofibroma is a round, brownish to red-purple growth commonly found on the legs. It is also called a histiocytoma. It can occur anywhere, but seems to favor exposed areas. Dermatofibromas feel like hard lumps under the skin. They're like an iceberg in that there is more under the skin than seen on the surface. Often these start out as red, turning later to brown, and sometimes itch. They probably are a reaction to a minor injury, such a bug bite or a splinter.  We do know that they are harmless and never  turn cancerous. For this reason, they are best left alone. If the diagnosis is uncertain, a biopsy may be taken for confirmation. Usually, complete surgical removal leaves a scar that is worse than the Dermatofibroma.  Treatment of dermatofibromas should be considered when they get in the way of shaving or become irritated by clothing. In these cases surgical shaving of the top, punching out the center and maybe freezing with liquid nitrogen can be done. These treatments only  destroy the upper part of the growth, and after a few years it may again become noticeable. If this happens, the treatment could be repeated again if desired or the lesion can be completely excised.    If you have been instructed to have an in-person evaluation today at a local Urgent Care facility, please use the link below. It will take you to a list of all of our available [facility], including address, phone number and hours of operation. Please do not delay care.  [facility]  If you or a family member do not have a primary care provider, use the link below to schedule a visit and establish care. When you choose a [facility] primary care physician or advanced practice provider, you gain a long-term partner in health. Find a Primary Care Provider  Learn more about [facility] in-office and virtual care options: [facility] - Get Care Now

## 2023-11-08 DIAGNOSIS — K080 Exfoliation of teeth due to systemic causes: Secondary | ICD-10-CM | POA: Diagnosis not present

## 2023-11-22 DIAGNOSIS — K080 Exfoliation of teeth due to systemic causes: Secondary | ICD-10-CM | POA: Diagnosis not present

## 2023-12-04 ENCOUNTER — Ambulatory Visit
Admission: RE | Admit: 2023-12-04 | Discharge: 2023-12-04 | Disposition: A | Discharge status: Home / Self Care | Source: Ambulatory Visit | Attending: Internal Medicine | Admitting: Internal Medicine

## 2023-12-04 DIAGNOSIS — Z1231 Encounter for screening mammogram for malignant neoplasm of breast: Secondary | ICD-10-CM | POA: Diagnosis not present

## 2023-12-06 ENCOUNTER — Other Ambulatory Visit: Payer: Self-pay | Admitting: Internal Medicine

## 2023-12-06 DIAGNOSIS — M7989 Other specified soft tissue disorders: Secondary | ICD-10-CM

## 2023-12-06 NOTE — Telephone Encounter (Signed)
 Requested Prescriptions  Pending Prescriptions Disp Refills   furosemide  (LASIX ) 20 MG tablet [Pharmacy Med Name: FUROSEMIDE  20 MG TABLET] 90 tablet 0    Sig: TAKE 1 TABLET (20 MG TOTAL) BY MOUTH DAILY AS NEEDED FOR EDEMA OR FLUID.     Cardiovascular:  Diuretics - Loop Failed - 12/06/2023  5:53 PM      Failed - Mg Level in normal range and within 180 days    Magnesium   Date Value Ref Range Status  08/21/2022 2.3 1.5 - 2.5 mg/dL Final         Passed - K in normal range and within 180 days    Potassium  Date Value Ref Range Status  08/15/2023 4.1 3.5 - 5.3 mmol/L Final  07/03/2012 3.7 3.5 - 5.1 mmol/L Final         Passed - Ca in normal range and within 180 days    Calcium   Date Value Ref Range Status  08/15/2023 9.9 8.6 - 10.4 mg/dL Final   Calcium , Total  Date Value Ref Range Status  07/03/2012 9.0 8.5 - 10.1 mg/dL Final         Passed - Na in normal range and within 180 days    Sodium  Date Value Ref Range Status  08/15/2023 143 135 - 146 mmol/L Final  07/03/2012 141 136 - 145 mmol/L Final         Passed - Cr in normal range and within 180 days    Creat  Date Value Ref Range Status  08/15/2023 0.91 0.60 - 1.00 mg/dL Final         Passed - Cl in normal range and within 180 days    Chloride  Date Value Ref Range Status  08/15/2023 103 98 - 110 mmol/L Final  07/03/2012 109 (H) 98 - 107 mmol/L Final         Passed - Last BP in normal range    BP Readings from Last 1 Encounters:  08/15/23 130/84         Passed - Valid encounter within last 6 months    Recent Outpatient Visits           3 months ago Hypertension, benign   [facility] Bernardo Fend, DO   4 months ago Swelling of right lower extremity   [facility] Bernardo Fend, DO       Future Appointments             In 1 month Maurine Lucie RIGGERS [facility] [place]

## 2023-12-09 ENCOUNTER — Ambulatory Visit: Payer: Self-pay | Admitting: Internal Medicine

## 2023-12-14 ENCOUNTER — Other Ambulatory Visit: Payer: Self-pay | Admitting: Internal Medicine

## 2023-12-14 DIAGNOSIS — E1165 Type 2 diabetes mellitus with hyperglycemia: Secondary | ICD-10-CM

## 2023-12-16 NOTE — Telephone Encounter (Signed)
 Requested Prescriptions  Refused Prescriptions Disp Refills   Lancet Devices (ONETOUCH DELICA PLUS LANCING) MISC [Pharmacy Med Name: ONE TOUCH DELICA PLUS [facility] DEV]      Sig: 1 EACH BY DOES NOT APPLY ROUTE IN THE MORNING, AT NOON, AND AT BEDTIME.     Endocrinology: Diabetes - Testing Supplies Passed - 12/16/2023  4:38 PM      Passed - Valid encounter within last 12 months    Recent Outpatient Visits           4 months ago Hypertension, benign   [facility] Bernardo Fend, DO   4 months ago Swelling of right lower extremity   [facility] Bernardo Fend, DO       Future Appointments             In 1 month Vaillancourt, Samantha, PA-C [facility] [place]

## 2024-01-01 DIAGNOSIS — M5416 Radiculopathy, lumbar region: Secondary | ICD-10-CM | POA: Diagnosis not present

## 2024-01-14 DIAGNOSIS — K080 Exfoliation of teeth due to systemic causes: Secondary | ICD-10-CM | POA: Diagnosis not present

## 2024-01-16 ENCOUNTER — Ambulatory Visit (INDEPENDENT_AMBULATORY_CARE_PROVIDER_SITE_OTHER): Payer: Self-pay | Admitting: Physician Assistant

## 2024-01-16 VITALS — BP 142/83 | HR 79 | Ht 62.0 in | Wt 214.0 lb

## 2024-01-16 DIAGNOSIS — N321 Vesicointestinal fistula: Secondary | ICD-10-CM | POA: Diagnosis not present

## 2024-01-16 LAB — URINALYSIS, COMPLETE
Bilirubin, UA: NEGATIVE
Glucose, UA: NEGATIVE
Ketones, UA: NEGATIVE
Nitrite, UA: POSITIVE — AB
Protein,UA: NEGATIVE
RBC, UA: NEGATIVE
Specific Gravity, UA: 1.02 (ref 1.005–1.030)
Urobilinogen, Ur: 0.2 mg/dL (ref 0.2–1.0)
pH, UA: 6 (ref 5.0–7.5)

## 2024-01-16 LAB — MICROSCOPIC EXAMINATION
Epithelial Cells (non renal): >10 /HPF — AB (ref 0–10)
WBC, UA: >30 /HPF — AB (ref 0–5)

## 2024-01-16 NOTE — Progress Notes (Unsigned)
 01/16/2024 4:47 PM   Terri Werner [birth year] [id number]  CC: Chief Complaint  Patient presents with   Follow-up   HPI: Terri Werner is a 73 y.o. female with PMH chronic colovesical fistula managed conservatively and urinary urgency who presents today for annual follow-up.   Today she reports no significant urologic changes in the past year.  No UTIs or gross hematuria.  She still has occasional pneumaturia, but no fecaluria.  She has occasional urgency when she takes diuretics.  Overall she is pleased.  In-office UA today positive for nitrites and trace leukocytes; urine microscopy with >30 WBCs/HPF, >10 epithelial cells/hpf, and many bacteria.   PMH: Past Medical History:  Diagnosis Date   Arthritis    pt reports in back & knees   Colon polyps 2010   At [facility]   Diverticulitis 2016   Genital warts    GERD (gastroesophageal reflux disease)    Heart murmur    Hepatitis B    History of blood transfusion    during each major surgery per pt   History of chicken pox    History of hiatal hernia    History of torn meniscus of right knee    HTN (hypertension)    Hx of migraine headaches    Hypertension    Hypopotassemia    Pre-diabetes    Stroke (HCC) 05/27/2021    Surgical History: Past Surgical History:  Procedure Laterality Date   ABDOMINAL HYSTERECTOMY  2006   APPENDECTOMY  1980   CESAREAN SECTION  1981   COLONOSCOPY WITH PROPOFOL  N/A 11/01/2014   Procedure: COLONOSCOPY WITH PROPOFOL ;  Surgeon: Lamar ONEIDA Holmes, MD;  Location: [facility] ENDOSCOPY;  Service: Endoscopy;  Laterality: N/A;   COLONOSCOPY WITH PROPOFOL  N/A 01/05/2020   Procedure: COLONOSCOPY WITH PROPOFOL ;  Surgeon: Maryruth Ole ONEIDA, MD;  Location: [facility] ENDOSCOPY;  Service: Endoscopy;  Laterality: N/A;   CYSTOSCOPY WITH BIOPSY N/A 02/19/2022   Procedure: CYSTOSCOPY WITH BLADDER BIOPSY;  Surgeon: Penne Knee, MD;  Location: [facility] ORS;  Service: Urology;  Laterality: N/A;   ECTOPIC  PREGNANCY SURGERY  1980   HERNIA REPAIR  2006   Umbilical   INCISIONAL HERNIA REPAIR N/A 03/01/2023   Procedure: PRIMARY REPAIR OF INCISIONAL HERNIA;  Surgeon: Tye Millet, DO;  Location: [facility] ORS;  Service: General;  Laterality: N/A;   KNEE ARTHROSCOPY Left 2006   torn meniscus   LAPAROTOMY N/A 03/01/2023   Procedure: EXPLORATORY LAPAROTOMY;  Surgeon: Tye Millet, DO;  Location: [facility] ORS;  Service: General;  Laterality: N/A;   LYSIS OF ADHESION N/A 03/01/2023   Procedure: LYSIS OF ADHESIONS;  Surgeon: Tye Millet, DO;  Location: [facility] ORS;  Service: General;  Laterality: N/A;   TONSILLECTOMY  1971   TOTAL ABDOMINAL HYSTERECTOMY W/ BILATERAL SALPINGOOPHORECTOMY  2006   VESICOVAGINAL FISTULA CLOSURE W/ TAH      Home Medications:  Allergies as of 01/16/2024       Reactions   Latex Itching, Rash, Anaphylaxis   Burning   Shellfish-derived Products Anaphylaxis   Shrimp [shellfish Allergy] Swelling   Lips will burn   Lisinopril Other (See Comments)   cough Other Reaction(s): Other (See Comments) cough    cough cough        Medication List        Accurate as of January 16, 2024  4:47 PM. If you have any questions, ask your nurse or doctor.          STOP taking these medications    cyclobenzaprine   5 MG tablet Commonly known as: FLEXERIL    nystatin  cream Commonly known as: MYCOSTATIN    predniSONE  10 MG (21) Tbpk tablet Commonly known as: STERAPRED UNI-PAK 21 TAB   triamcinolone  cream 0.1 % Commonly known as: KENALOG        TAKE these medications    acetaminophen  325 MG tablet Commonly known as: TYLENOL  Take 2 tablets (650 mg total) by mouth every 4 (four) hours as needed for mild pain (or temp > 37.5 C (99.5 F)).   amLODipine  5 MG tablet Commonly known as: NORVASC  Take 1 tablet (5 mg total) by mouth daily.   bisacodyl 5 MG EC tablet Commonly known as: DULCOLAX Take 5 mg by mouth daily as needed for moderate constipation.   furosemide  20 MG  tablet Commonly known as: LASIX  TAKE 1 TABLET (20 MG TOTAL) BY MOUTH DAILY AS NEEDED FOR EDEMA OR FLUID.   lidocaine  5 % ointment Commonly known as: XYLOCAINE  Apply 1 Application topically as needed.   metFORMIN  500 MG tablet Commonly known as: GLUCOPHAGE  Take 1 tablet (500 mg total) by mouth daily with breakfast.   OneTouch Delica Plus Lancet33G Misc 1 EACH BY DOES NOT APPLY ROUTE IN THE MORNING, AT NOON, AND AT BEDTIME.   OneTouch Verio Flex System w/Device Kit 1 EACH BY DOES NOT APPLY ROUTE IN THE MORNING, AT NOON, AND AT BEDTIME.   OneTouch Verio test strip Generic drug: glucose blood 1 EACH BY IN VITRO ROUTE IN THE MORNING, AT NOON, AND AT BEDTIME.   polyethylene glycol powder 17 GM/SCOOP powder Commonly known as: GLYCOLAX /MIRALAX  Take 17 g by mouth daily as needed for moderate constipation.   rosuvastatin  10 MG tablet Commonly known as: CRESTOR  TAKE 1 TABLET BY MOUTH EVERY DAY   sennosides-docusate sodium  8.6-50 MG tablet Commonly known as: SENOKOT-S Take 1 tablet by mouth daily. Takes as needed   valsartan -hydrochlorothiazide  80-12.5 MG tablet Commonly known as: DIOVAN -HCT Take 1 tablet by mouth daily.        Allergies:  Allergies  Allergen Reactions   Latex Itching, Rash and Anaphylaxis    Burning   Shellfish-Derived Products Anaphylaxis   Shrimp [Shellfish Allergy] Swelling    Lips will burn    Lisinopril Other (See Comments)    cough  Other Reaction(s): Other (See Comments)  cough    cough cough    Family History: Family History  Problem Relation Age of Onset   Breast cancer Sister 84   Hypertension Mother    Alzheimer's disease Mother    Dementia Mother    Hypertension Maternal Grandfather    Hyperlipidemia Maternal Grandfather    Birth defects Maternal Grandmother        Bladder   Colon cancer Maternal Grandmother    Congestive Heart Failure Father    Hyperlipidemia Father    Dementia Paternal Grandmother    Dementia Paternal  Grandfather    Thyroid  disease Sister     Social History:   reports that she has never smoked. She has never used smokeless tobacco. She reports that she does not currently use alcohol. She reports that she does not use drugs.  Physical Exam: BP (!) 142/83 (BP Location: Left Arm, Patient Position: Sitting, Cuff Size: Normal)   Pulse 79   Ht 5' 2 (1.575 m)   Wt 214 lb (97.1 kg)   SpO2 99%   BMI 39.14 kg/m   Constitutional:  Alert and oriented, no acute distress, nontoxic appearing HEENT: [place], AT Cardiovascular: No clubbing, cyanosis, or edema Respiratory: Normal respiratory  effort, no increased work of breathing Skin: No rashes, bruises or suspicious lesions Neurologic: Grossly intact, no focal deficits, moving all 4 extremities Psychiatric: Normal mood and affect  Laboratory Data: Results for orders placed or performed in visit on 01/16/24  Microscopic Examination   Collection Time: 01/16/24  9:54 AM   Urine  Result Value Ref Range   WBC, UA >30 (A) 0 - 5 /hpf   RBC, Urine 0-2 0 - 2 /hpf   Epithelial Cells (non renal) >10 (A) 0 - 10 /hpf   Bacteria, UA Many (A) None seen/Few  Urinalysis, Complete   Collection Time: 01/16/24  9:54 AM  Result Value Ref Range   Specific Gravity, UA 1.020 1.005 - 1.030   pH, UA 6.0 5.0 - 7.5   Color, UA Yellow Yellow   Appearance Ur Slightly cloudy Clear   Leukocytes,UA Trace (A) Negative   Protein,UA Negative Negative/Trace   Glucose, UA Negative Negative   Ketones, UA Negative Negative   RBC, UA Negative Negative   Bilirubin, UA Negative Negative   Urobilinogen, Ur 0.2 0.2 - 1.0 mg/dL   Nitrite, UA Positive (A) Negative   Microscopic Examination See below:    Assessment & Plan:   1. Colovesical fistula (Primary) Stable, no UTIs.  Only symptom is occasional pneumaturia.  No indication for more aggressive treatment at this time.  UA is grossly positive, consistent with her anticipated colonization status. - Urinalysis, Complete    Return in about 1 year (around 01/15/2025) for Annual UA.  Lucie Hones, PA-C  [facility]  [address] [place], [place] [address] [phone]

## 2024-01-17 DIAGNOSIS — L821 Other seborrheic keratosis: Secondary | ICD-10-CM | POA: Diagnosis not present

## 2024-01-17 DIAGNOSIS — D2372 Other benign neoplasm of skin of left lower limb, including hip: Secondary | ICD-10-CM | POA: Diagnosis not present

## 2024-02-04 ENCOUNTER — Other Ambulatory Visit: Payer: Self-pay | Admitting: Internal Medicine

## 2024-02-04 DIAGNOSIS — R7303 Prediabetes: Secondary | ICD-10-CM

## 2024-02-04 NOTE — Telephone Encounter (Signed)
 Requested Prescriptions  Pending Prescriptions Disp Refills   metFORMIN  (GLUCOPHAGE ) 500 MG tablet [Pharmacy Med Name: METFORMIN  HCL 500 MG TABLET] 90 tablet 0    Sig: TAKE 1 TABLET BY MOUTH EVERY DAY WITH BREAKFAST     Endocrinology:  Diabetes - Biguanides Passed - 02/04/2024  5:46 PM      Passed - Cr in normal range and within 360 days    Creat  Date Value Ref Range Status  08/15/2023 0.91 0.60 - 1.00 mg/dL Final         Passed - HBA1C is between 0 and 7.9 and within 180 days    Hgb A1c MFr Bld  Date Value Ref Range Status  08/15/2023 6.5 (H) <5.7 % of total Hgb Final    Comment:    For someone without known diabetes, a hemoglobin A1c value of 6.5% or greater indicates that they may have  diabetes and this should be confirmed with a follow-up  test. . For someone with known diabetes, a value <7% indicates  that their diabetes is well controlled and a value  greater than or equal to 7% indicates suboptimal  control. A1c targets should be individualized based on  duration of diabetes, age, comorbid conditions, and  other considerations. . Currently, no consensus exists regarding use of hemoglobin A1c for diagnosis of diabetes for children. .          Passed - eGFR in normal range and within 360 days    GFR, Est African American  Date Value Ref Range Status  01/13/2019 60 > OR = 60 mL/min/1.41m2 Final   GFR, Est Non African American  Date Value Ref Range Status  01/13/2019 52 (L) > OR = 60 mL/min/1.86m2 Final   GFR, Estimated  Date Value Ref Range Status  03/06/2023 >60 >60 mL/min Final    Comment:    (NOTE) Calculated using the CKD-EPI Creatinine Equation (2021)    GFR  Date Value Ref Range Status  07/05/2014 82.53 >60.00 mL/min Final   eGFR  Date Value Ref Range Status  08/21/2022 74 > OR = 60 mL/min/1.72m2 Final         Passed - B12 Level in normal range and within 720 days    Vitamin B-12  Date Value Ref Range Status  08/20/2023 1,097 200 - 1,100  pg/mL Final         Passed - Valid encounter within last 6 months    Recent Outpatient Visits           5 months ago Hypertension, benign   [facility] Bernardo Fend, DO   6 months ago Swelling of right lower extremity   [facility] Bernardo Fend, DO       Future Appointments             In 11 months Maurine Lukes, PA-C [facility] [place]            Passed - CBC within normal limits and completed in the last 12 months    WBC  Date Value Ref Range Status  08/15/2023 5.8 3.8 - 10.8 Thousand/uL Final   RBC  Date Value Ref Range Status  08/15/2023 4.11 3.80 - 5.10 Million/uL Final   Hemoglobin  Date Value Ref Range Status  08/15/2023 11.5 (L) 11.7 - 15.5 g/dL Final   HGB  Date Value Ref Range Status  07/03/2012 13.1 12.0 - 16.0 g/dL Final   HCT  Date Value Ref Range Status  08/15/2023 35.6 35.0 - 45.0 % Final  07/03/2012 40.6 35.0 - 47.0 % Final   MCHC  Date Value Ref Range Status  08/15/2023 32.3 32.0 - 36.0 g/dL Final    Comment:    For adults, a slight decrease in the calculated MCHC value (in the range of 30 to 32 g/dL) is most likely not clinically significant; however, it should be interpreted with caution in correlation with other red cell parameters and the patient's clinical condition.    [facility]  Date Value Ref Range Status  08/15/2023 28.0 27.0 - 33.0 pg Final   MCV  Date Value Ref Range Status  08/15/2023 86.6 80.0 - 100.0 fL Final  07/03/2012 86 80 - 100 fL Final   No results found for: PLTCOUNTKUC, LABPLAT, POCPLA RDW  Date Value Ref Range Status  08/15/2023 14.2 11.0 - 15.0 % Final  07/03/2012 15.3 (H) 11.5 - 14.5 % Final

## 2024-02-05 ENCOUNTER — Other Ambulatory Visit: Payer: Self-pay | Admitting: Internal Medicine

## 2024-02-05 DIAGNOSIS — E7800 Pure hypercholesterolemia, unspecified: Secondary | ICD-10-CM

## 2024-02-07 NOTE — Telephone Encounter (Signed)
 Requested Prescriptions  Pending Prescriptions Disp Refills   rosuvastatin  (CRESTOR ) 10 MG tablet [Pharmacy Med Name: ROSUVASTATIN  CALCIUM  10 MG TAB] 90 tablet 1    Sig: TAKE 1 TABLET BY MOUTH EVERY DAY     Cardiovascular:  Antilipid - Statins 2 Failed - 02/07/2024  9:59 AM      Failed - Lipid Panel in normal range within the last 12 months    Cholesterol  Date Value Ref Range Status  08/15/2023 146 <200 mg/dL Final   LDL Cholesterol (Calc)  Date Value Ref Range Status  08/15/2023 69 mg/dL (calc) Final    Comment:    Reference range: <100 . Desirable range <100 mg/dL for primary prevention;   <70 mg/dL for patients with CHD or diabetic patients  with > or = 2 CHD risk factors. SABRA LDL-C is now calculated using the Martin-Hopkins  calculation, which is a validated novel method providing  better accuracy than the Friedewald equation in the  estimation of LDL-C.  Gladis APPLETHWAITE et al. SANDREA. 7986;689(80): 2061-2068  (http://education.[employer].com/faq/FAQ164)    HDL  Date Value Ref Range Status  08/15/2023 62 > OR = 50 mg/dL Final   Triglycerides  Date Value Ref Range Status  08/15/2023 72 <150 mg/dL Final         Passed - Cr in normal range and within 360 days    Creat  Date Value Ref Range Status  08/15/2023 0.91 0.60 - 1.00 mg/dL Final         Passed - Patient is not pregnant      Passed - Valid encounter within last 12 months    Recent Outpatient Visits           5 months ago Hypertension, benign   [facility] Bernardo Fend, DO   6 months ago Swelling of right lower extremity   [facility] Bernardo Fend, DO       Future Appointments             In 11 months Maurine Lucie RIGGERS [facility] [place]

## 2024-02-27 ENCOUNTER — Other Ambulatory Visit: Payer: Self-pay | Admitting: Internal Medicine

## 2024-02-27 DIAGNOSIS — M7989 Other specified soft tissue disorders: Secondary | ICD-10-CM

## 2024-02-28 NOTE — Progress Notes (Signed)
 Terri Werner                                          MRN: [id number]   02/28/2024   The VBCI Quality Team Specialist reviewed this patient medical record for the purposes of chart review for care gap closure. The following were reviewed: chart review for care gap closure-kidney health evaluation for diabetes:eGFR  and uACR.    VBCI Quality Team

## 2024-02-28 NOTE — Telephone Encounter (Signed)
 Requested medications are due for refill today.  yes  Requested medications are on the active medications list.  yes  Last refill. 12/06/2023 #90 0 rf  Future visit scheduled.   A wellness visit is scheduled for next year.  Notes to clinic.  Labs are expired.    Requested Prescriptions  Pending Prescriptions Disp Refills   furosemide  (LASIX ) 20 MG tablet [Pharmacy Med Name: FUROSEMIDE  20 MG TABLET] 90 tablet 0    Sig: TAKE 1 TABLET (20 MG TOTAL) BY MOUTH DAILY AS NEEDED FOR EDEMA OR FLUID.     Cardiovascular:  Diuretics - Loop Failed - 02/28/2024  3:04 PM      Failed - K in normal range and within 180 days    Potassium  Date Value Ref Range Status  08/15/2023 4.1 3.5 - 5.3 mmol/L Final  07/03/2012 3.7 3.5 - 5.1 mmol/L Final         Failed - Ca in normal range and within 180 days    Calcium   Date Value Ref Range Status  08/15/2023 9.9 8.6 - 10.4 mg/dL Final   Calcium , Total  Date Value Ref Range Status  07/03/2012 9.0 8.5 - 10.1 mg/dL Final         Failed - Na in normal range and within 180 days    Sodium  Date Value Ref Range Status  08/15/2023 143 135 - 146 mmol/L Final  07/03/2012 141 136 - 145 mmol/L Final         Failed - Cr in normal range and within 180 days    Creat  Date Value Ref Range Status  08/15/2023 0.91 0.60 - 1.00 mg/dL Final         Failed - Cl in normal range and within 180 days    Chloride  Date Value Ref Range Status  08/15/2023 103 98 - 110 mmol/L Final  07/03/2012 109 (H) 98 - 107 mmol/L Final         Failed - Mg Level in normal range and within 180 days    Magnesium   Date Value Ref Range Status  08/21/2022 2.3 1.5 - 2.5 mg/dL Final         Failed - Last BP in normal range    BP Readings from Last 1 Encounters:  01/16/24 (!) 142/83         Failed - Valid encounter within last 6 months    Recent Outpatient Visits           6 months ago Hypertension, benign   [facility]ardo Fend, DO   7  months ago Swelling of right lower extremity   [facility] Bernardo Fend, DO       Future Appointments             In 10 months Maurine Lucie RIGGERS [facility] [place]

## 2024-03-26 NOTE — Progress Notes (Signed)
 Terri Werner                                          MRN: [id number]   03/26/2024   The VBCI Quality Team Specialist reviewed this patient medical record for the purposes of chart review for care gap closure. The following were reviewed: chart review for care gap closure-controlling blood pressure.    VBCI Quality Team

## 2024-03-28 ENCOUNTER — Other Ambulatory Visit: Payer: Self-pay | Admitting: Internal Medicine

## 2024-03-28 DIAGNOSIS — I10 Essential (primary) hypertension: Secondary | ICD-10-CM

## 2024-03-31 ENCOUNTER — Telehealth: Payer: Self-pay

## 2024-03-31 MED ORDER — LANCETS MISC: 3 refills | Status: AC

## 2024-03-31 MED ORDER — CONTOUR MONITOR DEVI: 0 refills | Status: DC

## 2024-03-31 MED ORDER — GLUCOSE BLOOD VI STRP: ORAL_STRIP | 3 refills | Status: DC

## 2024-03-31 NOTE — Telephone Encounter (Signed)
 Requested medications are due for refill today.  yes  Requested medications are on the active medications list.  yes  Last refill. 08/15/2023 #90 1 rf  Future visit scheduled.   Next year  Notes to clinic.  Labs are expired.    Requested Prescriptions  Pending Prescriptions Disp Refills   amLODipine  (NORVASC ) 5 MG tablet [Pharmacy Med Name: AMLODIPINE  BESYLATE 5 MG TAB] 90 tablet 1    Sig: Take 1 tablet (5 mg total) by mouth daily.     Cardiovascular: Calcium  Channel Blockers 2 Failed - 03/31/2024 11:29 AM      Failed - Last BP in normal range    BP Readings from Last 1 Encounters:  01/16/24 (!) 142/83         Failed - Valid encounter within last 6 months    Recent Outpatient Visits           7 months ago Hypertension, benign   [facility] Bernardo Fend, DO   8 months ago Swelling of right lower extremity   [facility] Bernardo Fend, DO       Future Appointments             In 9 months Maurine Lukes, [facility] [place]            Passed - Last Heart Rate in normal range    Pulse Readings from Last 1 Encounters:  01/16/24 79          valsartan -hydrochlorothiazide  (DIOVAN -HCT) 80-12.5 MG tablet [Pharmacy Med Name: VALSARTAN -HCTZ 80-12.5 MG TAB] 90 tablet 1    Sig: TAKE 1 TABLET BY MOUTH EVERY DAY     Cardiovascular: ARB + Diuretic Combos Failed - 03/31/2024 11:29 AM      Failed - K in normal range and within 180 days    Potassium  Date Value Ref Range Status  08/15/2023 4.1 3.5 - 5.3 mmol/L Final  07/03/2012 3.7 3.5 - 5.1 mmol/L Final         Failed - Na in normal range and within 180 days    Sodium  Date Value Ref Range Status  08/15/2023 143 135 - 146 mmol/L Final  07/03/2012 141 136 - 145 mmol/L Final         Failed - Cr in normal range and within 180 days    Creat  Date Value Ref Range Status  08/15/2023 0.91 0.60 - 1.00 mg/dL Final         Failed - eGFR  is 10 or above and within 180 days    GFR, Est African American  Date Value Ref Range Status  01/13/2019 60 > OR = 60 mL/min/1.61m2 Final   GFR, Est Non African American  Date Value Ref Range Status  01/13/2019 52 (L) > OR = 60 mL/min/1.48m2 Final   GFR, Estimated  Date Value Ref Range Status  03/06/2023 >60 >60 mL/min Final    Comment:    (NOTE) Calculated using the CKD-EPI Creatinine Equation (2021)    GFR  Date Value Ref Range Status  07/05/2014 82.53 >60.00 mL/min Final   eGFR  Date Value Ref Range Status  08/21/2022 74 > OR = 60 mL/min/1.21m2 Final         Failed - Last BP in normal range    BP Readings from Last 1 Encounters:  01/16/24 (!) 142/83         Failed - Valid encounter within last 6 months    Recent Outpatient  Visits           7 months ago Hypertension, benign   [facility] Bernardo Fend, DO   8 months ago Swelling of right lower extremity   [facility] Bernardo Fend, DO       Future Appointments             In 9 months Maurine Lucie RIGGERS [facility] - Patient is not pregnant

## 2024-03-31 NOTE — Telephone Encounter (Signed)
 refills

## 2024-03-31 NOTE — Telephone Encounter (Signed)
 Copied from CRM [id number]. Topic: Clinical - Medication Question >> Mar 31, 2024  2:16 PM Gustabo D wrote: Pt doesn't want the one touch she needs to switch to [employer] due to her insurance

## 2024-04-14 ENCOUNTER — Ambulatory Visit: Admitting: Internal Medicine

## 2024-04-14 ENCOUNTER — Encounter: Payer: Self-pay | Admitting: Internal Medicine

## 2024-04-14 ENCOUNTER — Other Ambulatory Visit: Payer: Self-pay

## 2024-04-14 VITALS — BP 134/76 | HR 76 | Temp 97.8°F | Resp 16 | Ht 62.0 in | Wt 212.8 lb

## 2024-04-14 DIAGNOSIS — E119 Type 2 diabetes mellitus without complications: Secondary | ICD-10-CM | POA: Diagnosis not present

## 2024-04-14 DIAGNOSIS — Z8673 Personal history of transient ischemic attack (TIA), and cerebral infarction without residual deficits: Secondary | ICD-10-CM

## 2024-04-14 DIAGNOSIS — I872 Venous insufficiency (chronic) (peripheral): Secondary | ICD-10-CM

## 2024-04-14 DIAGNOSIS — E1165 Type 2 diabetes mellitus with hyperglycemia: Secondary | ICD-10-CM

## 2024-04-14 DIAGNOSIS — I10 Essential (primary) hypertension: Secondary | ICD-10-CM | POA: Diagnosis not present

## 2024-04-14 LAB — POCT GLYCOSYLATED HEMOGLOBIN (HGB A1C): Hemoglobin A1C: 6.2 % — AB (ref 4.0–5.6)

## 2024-04-14 MED ORDER — VALSARTAN-HYDROCHLOROTHIAZIDE 80-12.5 MG PO TABS: 1.0000 | ORAL_TABLET | Freq: Every day | ORAL | 1 refills | Status: AC

## 2024-04-14 MED ORDER — AMLODIPINE BESYLATE 5 MG PO TABS: 5.0000 mg | ORAL_TABLET | Freq: Every day | ORAL | 1 refills | Status: AC

## 2024-04-14 MED ORDER — CONTOUR MONITOR DEVI: 0 refills | Status: AC

## 2024-04-14 MED ORDER — GLUCOSE BLOOD VI STRP: ORAL_STRIP | 3 refills | Status: AC

## 2024-04-14 MED ORDER — METFORMIN HCL 500 MG PO TABS: 500.0000 mg | ORAL_TABLET | Freq: Every day | ORAL | 1 refills | Status: AC

## 2024-04-14 NOTE — Progress Notes (Signed)
 Established Patient Office Visit  Subjective   Patient ID: Terri Werner, female    DOB: 11/08/1950  Age: 73 y.o. MRN: [id number]  Chief Complaint  Patient presents with   Follow-up    HPI  Patient is here for follow up on chronic medical conditions.   Discussed the use of AI scribe software for clinical note transcription with the patient, who gave verbal consent to proceed.  History of Present Illness Terri Werner is a 73 year old female who presents for a routine follow-up and medication refills.  She continues to receive pain injections for chronic back pain, which give partial relief, with no recent changes from other providers.  She takes amlodipine , valsartan , hydrochlorothiazide , and Lasix  as needed and has not needed Lasix  recently. She takes Crestor  and metformin  daily without gastrointestinal upset but often feels constipated.  Her most recent A1c was 6.2. She exercises at the gym three to four days per week.  She reports an insurance-related change in blood sugar monitoring supplies. She received lancets but not test strips or a new meter.  She is up to date on flu, COVID, shingles, RSV, and pneumonia vaccines.  She notes an annual driving assessment requirement due to prior stroke and finds this burdensome.   Hypertension: -Medications: Amlodipine  5 mg, Valsartan -HCTZ 80-12.5 mg daily, Lasix  20 mg PRN  -Patient is compliant with above medications and reports no side effects. -Checking BP at home (average): 120-130/70-80 -Denies any SOB, CP, vision changes or symptoms of hypotension.   HLD/HX of CVA: -Medications: Crestor  10 mg, Flexeril  5 mg PRN. Does still have muscle spasms in her foot at night but the Flexeril  helps.  -Patient finished with PT and OT outpatient. Ambulating well with cane. Doing home exercises. Receiving botox injections in her right lower extremity which have been helping somewhat. Still having right footdrop which is  affecting her balance and ambulation  -Last lipid panel: Lipid Panel     Component Value Date/Time   CHOL 146 08/15/2023 1350   TRIG 72 08/15/2023 1350   HDL 62 08/15/2023 1350   CHOLHDL 2.4 08/15/2023 1350   VLDL 12 06/01/2021 0510   LDLCALC 69 08/15/2023 1350   Diabetes, Type 2: -Last A1c 4/25 6.5% -Medications: Metformin  500 mg once daily -Patient is compliant with the above medications and reports no side effects.  -Denies symptoms of hypoglycemia, polyuria, polydipsia, numbness extremities, foot ulcers/trauma.   Health Maintenance: -Blood work UTD -Mammogram 7/25 Birads-1  -Colon cancer screening: Colonoscopy 1/23 -Vaccinations up to date   Past Medical History:  Diagnosis Date   Arthritis    pt reports in back & knees   Colon polyps 2010   At [facility]   Diverticulitis 2016   Genital warts    GERD (gastroesophageal reflux disease)    Heart murmur    Hepatitis B    History of blood transfusion    during each major surgery per pt   History of chicken pox    History of hiatal hernia    History of torn meniscus of right knee    HTN (hypertension)    Hx of migraine headaches    Hypertension    Hypopotassemia    Pre-diabetes    Stroke (HCC) 05/27/2021   Past Surgical History:  Procedure Laterality Date   ABDOMINAL HYSTERECTOMY  2006   APPENDECTOMY  1980   CESAREAN SECTION  1981   COLONOSCOPY WITH PROPOFOL  N/A 11/01/2014   Procedure: COLONOSCOPY WITH PROPOFOL ;  Surgeon: Lamar T  Viktoria, MD;  Location: [facility];  Service: Endoscopy;  Laterality: N/A;   COLONOSCOPY WITH PROPOFOL  N/A 01/05/2020   Procedure: COLONOSCOPY WITH PROPOFOL ;  Surgeon: Maryruth Ole DASEN, MD;  Location: [facility];  Service: Endoscopy;  Laterality: N/A;   CYSTOSCOPY WITH BIOPSY N/A 02/19/2022   Procedure: CYSTOSCOPY WITH BLADDER BIOPSY;  Surgeon: Penne Knee, MD;  Location: [facility];  Service: Urology;  Laterality: N/A;   ECTOPIC PREGNANCY SURGERY  1980   HERNIA REPAIR  2006    Umbilical   INCISIONAL HERNIA REPAIR N/A 03/01/2023   Procedure: PRIMARY REPAIR OF INCISIONAL HERNIA;  Surgeon: Tye Millet, DO;  Location: [facility];  Service: General;  Laterality: N/A;   KNEE ARTHROSCOPY Left 2006   torn meniscus   LAPAROTOMY N/A 03/01/2023   Procedure: EXPLORATORY LAPAROTOMY;  Surgeon: Tye Millet, DO;  Location: [facility];  Service: General;  Laterality: N/A;   LYSIS OF ADHESION N/A 03/01/2023   Procedure: LYSIS OF ADHESIONS;  Surgeon: Tye Millet, DO;  Location: [facility];  Service: General;  Laterality: N/A;   TONSILLECTOMY  1971   TOTAL ABDOMINAL HYSTERECTOMY W/ BILATERAL SALPINGOOPHORECTOMY  2006   VESICOVAGINAL FISTULA CLOSURE W/ TAH     Social History   Tobacco Use   Smoking status: Never   Smokeless tobacco: Never  Vaping Use   Vaping status: Never Used  Substance Use Topics   Alcohol use: Not Currently    Comment: rare   Drug use: No   Social History   Socioeconomic History   Marital status: Widowed    Spouse name: Not on file   Number of children: 2   Years of education: Not on file   Highest education level: Master's degree (e.g., MA, MS, MEng, MEd, MSW, MBA)  Occupational History   Occupation: [employer] [place]    Comment: Full Time    Employer: [employer]  Tobacco Use   Smoking status: Never   Smokeless tobacco: Never  Vaping Use   Vaping status: Never Used  Substance and Sexual Activity   Alcohol use: Not Currently    Comment: rare   Drug use: No   Sexual activity: Not Currently    Partners: Male  Other Topics Concern   Not on file  Social History Narrative   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      Regular Exercise -  NO   Daily Caffeine Use:  1 soda/tea          Social Drivers of Health   Financial Resource Strain: High Risk (04/07/2024)   Overall Financial Resource Strain (CARDIA)     Difficulty of Paying Living Expenses: Hard  Food Insecurity: Food Insecurity Present (04/07/2024)   Hunger Vital Sign    Worried About Running Out of Food in the Last Year: Often true    Ran Out of Food in the Last Year: Sometimes true  Transportation Needs: No Transportation Needs (04/07/2024)   PRAPARE - Administrator, Civil Service (Medical): No    Lack of Transportation (Non-Medical): No  Physical Activity: Sufficiently Active (04/07/2024)   Exercise Vital Sign    Days of Exercise per Week: 4 days    Minutes of Exercise per Session: 50 min  Stress: Stress Concern Present (04/07/2024)   [employer] of Occupational Health - Occupational Stress Questionnaire    Feeling of Stress: To some extent  Social  Connections: Moderately Integrated (04/07/2024)   Social Connection and Isolation Panel    Frequency of Communication with Friends and Family: More than three times a week    Frequency of Social Gatherings with Friends and Family: Three times a week    Attends Religious Services: More than 4 times per year    Active Member of Clubs or Organizations: Yes    Attends Banker Meetings: More than 4 times per year    Marital Status: Widowed  Intimate Partner Violence: Not At Risk (07/25/2023)   Humiliation, Afraid, Rape, and Kick questionnaire    Fear of Current or Ex-Partner: No    Emotionally Abused: No    Physically Abused: No    Sexually Abused: No   Family Status  Relation Name Status   Sister  Deceased   Mother  Deceased   MGF  Deceased   MGM  Deceased   Father  Alive, age 73y   PGM  Deceased   PGF  Deceased   Sister Clinical Cytogeneticist   Sister  Alive   Daughter Psychiatric Nurse Alive   Daughter It Trainer  No partnership data on file   Family History  Problem Relation Age of Onset   Breast cancer Sister 73   Hypertension Mother    Alzheimer's disease Mother    Dementia Mother    Hypertension Maternal Grandfather    Hyperlipidemia Maternal  Grandfather    Birth defects Maternal Grandmother        Bladder   Colon cancer Maternal Grandmother    Congestive Heart Failure Father    Hyperlipidemia Father    Dementia Paternal Grandmother    Dementia Paternal Grandfather    Thyroid  disease Sister    Allergies  Allergen Reactions   Latex Itching, Rash and Anaphylaxis    Burning   Shellfish Protein-Containing Drug Products Anaphylaxis   Shrimp [Shellfish Allergy] Swelling    Lips will burn    Lisinopril Other (See Comments)    cough  Other Reaction(s): Other (See Comments)  cough    cough cough      Review of Systems  All other systems reviewed and are negative.     Objective:     BP 134/76 (Cuff Size: Large)   Pulse 76   Temp 97.8 F (36.6 C) (Oral)   Resp 16   Ht 5' 2 (1.575 m)   Wt 212 lb 12.8 oz (96.5 kg)   SpO2 99%   BMI 38.92 kg/m  BP Readings from Last 3 Encounters:  04/14/24 134/76  01/16/24 (!) 142/83  08/15/23 130/84   Wt Readings from Last 3 Encounters:  04/14/24 212 lb 12.8 oz (96.5 kg)  01/16/24 214 lb (97.1 kg)  08/15/23 217 lb (98.4 kg)      Physical Exam Constitutional:      Appearance: Normal appearance.  HENT:     Head: Normocephalic and atraumatic.     Mouth/Throat:     Mouth: Mucous membranes are moist.     Pharynx: Oropharynx is clear.  Eyes:     Extraocular Movements: Extraocular movements intact.     Conjunctiva/sclera: Conjunctivae normal.     Pupils: Pupils are equal, round, and reactive to light.  Cardiovascular:     Rate and Rhythm: Normal rate and regular rhythm.  Pulmonary:     Effort: Pulmonary effort is normal.     Breath sounds: Normal breath sounds.  Musculoskeletal:     Right lower leg: No edema.     Left lower  leg: No edema.  Skin:    General: Skin is warm and dry.     Comments: Venous stasis dermatis present b/l  Neurological:     General: No focal deficit present.     Mental Status: She is alert. Mental status is at baseline.  Psychiatric:         Mood and Affect: Mood normal.        Behavior: Behavior normal.      Results for orders placed or performed in visit on 04/14/24  POCT HgB A1C  Result Value Ref Range   Hemoglobin A1C 6.2 (A) 4.0 - 5.6 %   HbA1c POC (<> result, manual entry)     HbA1c, POC (prediabetic range)     HbA1c, POC (controlled diabetic range)      Last CBC Lab Results  Component Value Date   WBC 5.8 08/15/2023   HGB 11.5 (L) 08/15/2023   HCT 35.6 08/15/2023   MCV 86.6 08/15/2023   MCH 28.0 08/15/2023   RDW 14.2 08/15/2023   PLT 205 08/15/2023   Last metabolic panel Lab Results  Component Value Date   GLUCOSE 90 08/15/2023   NA 143 08/15/2023   K 4.1 08/15/2023   CL 103 08/15/2023   CO2 31 08/15/2023   BUN 15 08/15/2023   CREATININE 0.91 08/15/2023   GFRNONAA >60 03/06/2023   CALCIUM  9.9 08/15/2023   PROT 7.2 08/15/2023   ALBUMIN 4.4 02/28/2023   BILITOT 0.4 08/15/2023   ALKPHOS 50 02/28/2023   AST 16 08/15/2023   ALT 10 08/15/2023   ANIONGAP 8 03/06/2023   Last lipids Lab Results  Component Value Date   CHOL 146 08/15/2023   HDL 62 08/15/2023   LDLCALC 69 08/15/2023   TRIG 72 08/15/2023   CHOLHDL 2.4 08/15/2023   Last hemoglobin A1c Lab Results  Component Value Date   HGBA1C 6.2 (A) 04/14/2024   Last thyroid  functions Lab Results  Component Value Date   TSH 1.17 08/15/2023   Last vitamin D  Lab Results  Component Value Date   VD25OH 39 08/15/2023   Last vitamin B12 and Folate Lab Results  Component Value Date   VITAMINB12 1,097 08/20/2023      The ASCVD Risk score (Arnett DK, et al., 2019) failed to calculate for the following reasons:   Risk score cannot be calculated because patient has a medical history suggesting prior/existing ASCVD    Assessment & Plan:   Assessment & Plan Type 2 diabetes mellitus Well-controlled with A1c of 6.2. No gastrointestinal side effects from metformin . - Continue metformin  once daily in the morning. - Refilled  metformin  prescription. - Sent in test strips and Contour monitor for blood glucose monitoring.  Essential hypertension Blood pressure well-controlled at 134/76 mmHg. Current medications include amlodipine , valsartan , and hydrochlorothiazide . - Ensured refills for amlodipine , valsartan , and hydrochlorothiazide . - Continue current antihypertensive regimen.  Venous stasis dermatitis Discoloration likely permanent due to previous fluid retention. No current swelling observed.  History of cerebrovascular accident No recent episodes or complications. Annual driving evaluation required due to stroke history. - Continue annual driving evaluation as required.  General Health Maintenance Up to date on vaccinations. Mammogram results normal.  - POCT HgB A1C - metFORMIN  (GLUCOPHAGE ) 500 MG tablet; Take 1 tablet (500 mg total) by mouth daily with breakfast.  Dispense: 90 tablet; Refill: 1 - Blood Glucose Monitoring Suppl (CONTOUR MONITOR) DEVI; Dx E11.65 Check BG every day ([facility] Contour)  Dispense: 1 each; Refill: 0 - glucose blood test strip;  Dx:E11.65 check BG every day ([facility] Contour )  Dispense: 100 each; Refill: 3 - amLODipine  (NORVASC ) 5 MG tablet; Take 1 tablet (5 mg total) by mouth daily.  Dispense: 90 tablet; Refill: 1 - valsartan -hydrochlorothiazide  (DIOVAN -HCT) 80-12.5 MG tablet; Take 1 tablet by mouth daily.  Dispense: 90 tablet; Refill: 1   Return in about 6 months (around 10/13/2024).    Sharyle Fischer, DO

## 2024-05-23 ENCOUNTER — Other Ambulatory Visit: Payer: Self-pay | Admitting: Internal Medicine

## 2024-05-23 DIAGNOSIS — M7989 Other specified soft tissue disorders: Secondary | ICD-10-CM

## 2024-05-25 NOTE — Telephone Encounter (Signed)
 Requested medication (s) are due for refill today: yes  Requested medication (s) are on the active medication list: yes  Last refill:  02/28/24  Future visit scheduled: yes  Notes to clinic:  Unable to refill per protocol due to failed labs, no updated results.      Requested Prescriptions  Pending Prescriptions Disp Refills   furosemide  (LASIX ) 20 MG tablet [Pharmacy Med Name: FUROSEMIDE  20 MG TABLET] 90 tablet 0    Sig: TAKE 1 TABLET (20 MG TOTAL) BY MOUTH DAILY AS NEEDED FOR EDEMA OR FLUID.     Cardiovascular:  Diuretics - Loop Failed - 05/25/2024  3:27 PM      Failed - K in normal range and within 180 days    Potassium  Date Value Ref Range Status  08/15/2023 4.1 3.5 - 5.3 mmol/L Final  07/03/2012 3.7 3.5 - 5.1 mmol/L Final         Failed - Ca in normal range and within 180 days    Calcium   Date Value Ref Range Status  08/15/2023 9.9 8.6 - 10.4 mg/dL Final   Calcium , Total  Date Value Ref Range Status  07/03/2012 9.0 8.5 - 10.1 mg/dL Final         Failed - Na in normal range and within 180 days    Sodium  Date Value Ref Range Status  08/15/2023 143 135 - 146 mmol/L Final  07/03/2012 141 136 - 145 mmol/L Final         Failed - Cr in normal range and within 180 days    Creat  Date Value Ref Range Status  08/15/2023 0.91 0.60 - 1.00 mg/dL Final         Failed - Cl in normal range and within 180 days    Chloride  Date Value Ref Range Status  08/15/2023 103 98 - 110 mmol/L Final  07/03/2012 109 (H) 98 - 107 mmol/L Final         Failed - Mg Level in normal range and within 180 days    Magnesium   Date Value Ref Range Status  08/21/2022 2.3 1.5 - 2.5 mg/dL Final         Passed - Last BP in normal range    BP Readings from Last 1 Encounters:  04/14/24 134/76         Passed - Valid encounter within last 6 months    Recent Outpatient Visits           1 month ago Type 2 diabetes mellitus with hyperglycemia, without long-term current use of insulin [facility])    [facility] Bernardo Fend, DO   9 months ago Hypertension, benign   [facility] Bernardo Fend, DO   10 months ago Swelling of right lower extremity   [facility] Bernardo Fend, DO       Future Appointments             In 7 months Vaillancourt, Samantha, PA-C [facility]

## 2024-05-29 NOTE — Progress Notes (Signed)
 Terri Werner                                          MRN: [id number]   05/29/2024   The [facility] Quality Team Specialist reviewed this patient medical record for the purposes of chart review for care gap closure. The following were reviewed: abstraction for care gap closure-controlling blood pressure.    [facility] Quality Team

## 2024-07-30 ENCOUNTER — Ambulatory Visit

## 2024-10-15 ENCOUNTER — Ambulatory Visit: Admitting: Internal Medicine

## 2025-01-15 ENCOUNTER — Ambulatory Visit: Admitting: Physician Assistant
